# Patient Record
Sex: Female | Born: 1965
Health system: Southern US, Community
[De-identification: ages and names within clinical notes are randomized; demographics above are authoritative.]

## PROBLEM LIST (undated history)

## (undated) DIAGNOSIS — L309 Dermatitis, unspecified: Secondary | ICD-10-CM

## (undated) DIAGNOSIS — G43909 Migraine, unspecified, not intractable, without status migrainosus: Secondary | ICD-10-CM

## (undated) DIAGNOSIS — E559 Vitamin D deficiency, unspecified: Secondary | ICD-10-CM

## (undated) DIAGNOSIS — R202 Paresthesia of skin: Secondary | ICD-10-CM

## (undated) DIAGNOSIS — E785 Hyperlipidemia, unspecified: Secondary | ICD-10-CM

## (undated) DIAGNOSIS — K649 Unspecified hemorrhoids: Secondary | ICD-10-CM

## (undated) DIAGNOSIS — K219 Gastro-esophageal reflux disease without esophagitis: Secondary | ICD-10-CM

## (undated) DIAGNOSIS — R481 Agnosia: Secondary | ICD-10-CM

## (undated) DIAGNOSIS — M5030 Other cervical disc degeneration, unspecified cervical region: Secondary | ICD-10-CM

## (undated) DIAGNOSIS — R112 Nausea with vomiting, unspecified: Secondary | ICD-10-CM

## (undated) DIAGNOSIS — M21379 Foot drop, unspecified foot: Secondary | ICD-10-CM

## (undated) DIAGNOSIS — D649 Anemia, unspecified: Secondary | ICD-10-CM

## (undated) DIAGNOSIS — K5790 Diverticulosis of intestine, part unspecified, without perforation or abscess without bleeding: Secondary | ICD-10-CM

## (undated) DIAGNOSIS — M1990 Unspecified osteoarthritis, unspecified site: Secondary | ICD-10-CM

## (undated) DIAGNOSIS — F418 Other specified anxiety disorders: Secondary | ICD-10-CM

## (undated) DIAGNOSIS — D1803 Hemangioma of intra-abdominal structures: Secondary | ICD-10-CM

## (undated) DIAGNOSIS — G8929 Other chronic pain: Secondary | ICD-10-CM

## (undated) DIAGNOSIS — K8020 Calculus of gallbladder without cholecystitis without obstruction: Secondary | ICD-10-CM

## (undated) DIAGNOSIS — R413 Other amnesia: Secondary | ICD-10-CM

## (undated) DIAGNOSIS — E611 Iron deficiency: Secondary | ICD-10-CM

## (undated) DIAGNOSIS — R350 Frequency of micturition: Secondary | ICD-10-CM

## (undated) DIAGNOSIS — Z8489 Family history of other specified conditions: Secondary | ICD-10-CM

## (undated) DIAGNOSIS — F32A Depression, unspecified: Secondary | ICD-10-CM

## (undated) DIAGNOSIS — K589 Irritable bowel syndrome without diarrhea: Secondary | ICD-10-CM

## (undated) DIAGNOSIS — F329 Major depressive disorder, single episode, unspecified: Secondary | ICD-10-CM

## (undated) DIAGNOSIS — R51 Headache: Secondary | ICD-10-CM

## (undated) DIAGNOSIS — R32 Unspecified urinary incontinence: Secondary | ICD-10-CM

## (undated) DIAGNOSIS — E039 Hypothyroidism, unspecified: Secondary | ICD-10-CM

## (undated) DIAGNOSIS — Z87442 Personal history of urinary calculi: Secondary | ICD-10-CM

## (undated) DIAGNOSIS — M51369 Other intervertebral disc degeneration, lumbar region without mention of lumbar back pain or lower extremity pain: Secondary | ICD-10-CM

## (undated) DIAGNOSIS — R911 Solitary pulmonary nodule: Secondary | ICD-10-CM

## (undated) DIAGNOSIS — M5136 Other intervertebral disc degeneration, lumbar region: Secondary | ICD-10-CM

## (undated) DIAGNOSIS — F419 Anxiety disorder, unspecified: Secondary | ICD-10-CM

## (undated) DIAGNOSIS — R937 Abnormal findings on diagnostic imaging of other parts of musculoskeletal system: Secondary | ICD-10-CM

## (undated) DIAGNOSIS — Z9889 Other specified postprocedural states: Secondary | ICD-10-CM

## (undated) DIAGNOSIS — N319 Neuromuscular dysfunction of bladder, unspecified: Secondary | ICD-10-CM

## (undated) DIAGNOSIS — N3946 Mixed incontinence: Secondary | ICD-10-CM

## (undated) DIAGNOSIS — R351 Nocturia: Secondary | ICD-10-CM

## (undated) DIAGNOSIS — G4730 Sleep apnea, unspecified: Secondary | ICD-10-CM

## (undated) DIAGNOSIS — G5790 Unspecified mononeuropathy of unspecified lower limb: Secondary | ICD-10-CM

## (undated) DIAGNOSIS — G039 Meningitis, unspecified: Secondary | ICD-10-CM

## (undated) DIAGNOSIS — M797 Fibromyalgia: Secondary | ICD-10-CM

## (undated) HISTORY — DX: Mixed incontinence: N39.46

## (undated) HISTORY — DX: Vitamin D deficiency, unspecified: E55.9

## (undated) HISTORY — PX: SPINE SURGERY: SHX786

## (undated) HISTORY — PX: HEMORRHOID BANDING: SHX5850

## (undated) HISTORY — DX: Unspecified osteoarthritis, unspecified site: M19.90

## (undated) HISTORY — DX: Meningitis, unspecified: G03.9

## (undated) HISTORY — DX: Anxiety disorder, unspecified: F41.9

## (undated) HISTORY — DX: Unspecified hemorrhoids: K64.9

## (undated) HISTORY — DX: Irritable bowel syndrome, unspecified: K58.9

## (undated) HISTORY — DX: Hyperlipidemia, unspecified: E78.5

## (undated) HISTORY — DX: Depression, unspecified: F32.A

## (undated) HISTORY — PX: LASIK: SHX215

## (undated) HISTORY — DX: Calculus of gallbladder without cholecystitis without obstruction: K80.20

## (undated) HISTORY — DX: Migraine, unspecified, not intractable, without status migrainosus: G43.909

## (undated) HISTORY — DX: Neuromuscular dysfunction of bladder, unspecified: N31.9

## (undated) HISTORY — DX: Iron deficiency: E61.1

## (undated) HISTORY — DX: Diverticulosis of intestine, part unspecified, without perforation or abscess without bleeding: K57.90

## (undated) HISTORY — DX: Major depressive disorder, single episode, unspecified: F32.9

## (undated) HISTORY — DX: Other specified anxiety disorders: F41.8

## (undated) HISTORY — DX: Paresthesia of skin: R20.2

## (undated) HISTORY — DX: Other cervical disc degeneration, unspecified cervical region: M50.30

## (undated) HISTORY — DX: Dermatitis, unspecified: L30.9

## (undated) HISTORY — PX: ANTERIOR CERVICAL DECOMP/DISCECTOMY FUSION: SHX1161

## (undated) HISTORY — DX: Solitary pulmonary nodule: R91.1

## (undated) HISTORY — PX: BACK SURGERY: SHX140

## (undated) HISTORY — DX: Abnormal findings on diagnostic imaging of other parts of musculoskeletal system: R93.7

---
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
## 1999-01-02 HISTORY — PX: CHOLECYSTECTOMY: SHX55

## 2003-10-11 ENCOUNTER — Ambulatory Visit: Payer: Self-pay

## 2004-02-16 ENCOUNTER — Ambulatory Visit: Payer: Self-pay | Admitting: Internal Medicine

## 2004-03-02 ENCOUNTER — Ambulatory Visit: Payer: Self-pay

## 2006-01-01 DIAGNOSIS — N319 Neuromuscular dysfunction of bladder, unspecified: Secondary | ICD-10-CM

## 2006-01-01 DIAGNOSIS — N3946 Mixed incontinence: Secondary | ICD-10-CM

## 2006-01-01 HISTORY — PX: PUBOVAGINAL SLING: SHX1035

## 2006-01-01 HISTORY — DX: Neuromuscular dysfunction of bladder, unspecified: N31.9

## 2006-01-01 HISTORY — DX: Mixed incontinence: N39.46

## 2006-01-23 ENCOUNTER — Ambulatory Visit: Payer: Self-pay | Admitting: Family Medicine

## 2006-06-07 ENCOUNTER — Ambulatory Visit: Payer: Self-pay | Admitting: *Deleted

## 2006-08-02 LAB — HM COLONOSCOPY: HM Colonoscopy: NORMAL

## 2006-08-22 ENCOUNTER — Ambulatory Visit: Payer: Self-pay | Admitting: Gastroenterology

## 2006-10-16 ENCOUNTER — Encounter: Payer: Self-pay | Admitting: Rheumatology

## 2006-11-02 ENCOUNTER — Encounter: Payer: Self-pay | Admitting: Rheumatology

## 2006-12-02 ENCOUNTER — Encounter: Payer: Self-pay | Admitting: Rheumatology

## 2006-12-10 ENCOUNTER — Ambulatory Visit (HOSPITAL_BASED_OUTPATIENT_CLINIC_OR_DEPARTMENT_OTHER): Admission: RE | Admit: 2006-12-10 | Discharge: 2006-12-10 | Payer: Self-pay | Admitting: Urology

## 2007-01-14 ENCOUNTER — Ambulatory Visit: Payer: Self-pay | Admitting: Internal Medicine

## 2007-01-15 ENCOUNTER — Ambulatory Visit: Payer: Self-pay | Admitting: Internal Medicine

## 2007-01-24 ENCOUNTER — Ambulatory Visit: Payer: Self-pay | Admitting: Internal Medicine

## 2008-01-13 ENCOUNTER — Ambulatory Visit: Payer: Self-pay | Admitting: Internal Medicine

## 2008-05-26 ENCOUNTER — Ambulatory Visit: Payer: Self-pay | Admitting: Unknown Physician Specialty

## 2008-05-28 ENCOUNTER — Ambulatory Visit: Payer: Self-pay

## 2008-12-03 ENCOUNTER — Ambulatory Visit: Payer: Self-pay

## 2009-01-01 DIAGNOSIS — G5790 Unspecified mononeuropathy of unspecified lower limb: Secondary | ICD-10-CM

## 2009-01-01 HISTORY — DX: Unspecified mononeuropathy of unspecified lower limb: G57.90

## 2009-05-06 ENCOUNTER — Encounter: Admission: RE | Admit: 2009-05-06 | Discharge: 2009-05-06 | Payer: Self-pay | Admitting: Neurosurgery

## 2009-05-23 ENCOUNTER — Encounter (INDEPENDENT_AMBULATORY_CARE_PROVIDER_SITE_OTHER): Payer: Self-pay | Admitting: Neurosurgery

## 2009-05-23 ENCOUNTER — Inpatient Hospital Stay (HOSPITAL_COMMUNITY): Admission: RE | Admit: 2009-05-23 | Discharge: 2009-05-29 | Payer: Self-pay | Admitting: Neurosurgery

## 2009-06-03 ENCOUNTER — Emergency Department (HOSPITAL_COMMUNITY): Admission: EM | Admit: 2009-06-03 | Discharge: 2009-06-03 | Payer: Self-pay | Admitting: Emergency Medicine

## 2009-06-07 ENCOUNTER — Encounter: Admission: RE | Admit: 2009-06-07 | Discharge: 2009-06-07 | Payer: Self-pay | Admitting: Neurosurgery

## 2009-06-08 ENCOUNTER — Inpatient Hospital Stay (HOSPITAL_COMMUNITY): Admission: AD | Admit: 2009-06-08 | Discharge: 2009-06-16 | Payer: Self-pay | Admitting: Neurosurgery

## 2009-08-18 ENCOUNTER — Encounter: Admission: RE | Admit: 2009-08-18 | Discharge: 2009-08-18 | Payer: Self-pay | Admitting: Neurosurgery

## 2009-10-21 ENCOUNTER — Encounter: Admission: RE | Admit: 2009-10-21 | Discharge: 2009-10-21 | Payer: Self-pay | Admitting: Neurosurgery

## 2009-11-23 ENCOUNTER — Encounter: Admission: RE | Admit: 2009-11-23 | Discharge: 2009-11-23 | Payer: Self-pay | Admitting: Neurosurgery

## 2009-11-29 ENCOUNTER — Ambulatory Visit: Payer: Self-pay | Admitting: Vascular Surgery

## 2009-12-09 ENCOUNTER — Ambulatory Visit: Payer: Self-pay

## 2009-12-19 ENCOUNTER — Inpatient Hospital Stay (HOSPITAL_COMMUNITY)
Admission: RE | Admit: 2009-12-19 | Discharge: 2009-12-23 | Payer: Self-pay | Source: Home / Self Care | Attending: Neurosurgery | Admitting: Neurosurgery

## 2010-01-01 DIAGNOSIS — R937 Abnormal findings on diagnostic imaging of other parts of musculoskeletal system: Secondary | ICD-10-CM

## 2010-01-01 HISTORY — DX: Abnormal findings on diagnostic imaging of other parts of musculoskeletal system: R93.7

## 2010-01-26 ENCOUNTER — Encounter
Admission: RE | Admit: 2010-01-26 | Discharge: 2010-01-26 | Payer: Self-pay | Source: Home / Self Care | Attending: Neurosurgery | Admitting: Neurosurgery

## 2010-01-28 ENCOUNTER — Encounter
Admission: RE | Admit: 2010-01-28 | Discharge: 2010-01-28 | Payer: Self-pay | Source: Home / Self Care | Attending: Neurosurgery | Admitting: Neurosurgery

## 2010-02-02 ENCOUNTER — Other Ambulatory Visit: Payer: Self-pay | Admitting: Neurosurgery

## 2010-02-02 DIAGNOSIS — M545 Low back pain, unspecified: Secondary | ICD-10-CM

## 2010-02-09 NOTE — Discharge Summary (Signed)
  NAMEBEVAN, Karen Aguilar                ACCOUNT NO.:  [id number]  MEDICAL RECORD NO.:  [id number]          PATIENT TYPE:  INP  LOCATION:  3021                         FACILITY:  [facility]  PHYSICIAN:  Donalee Citrin, M.D.        DATE OF BIRTH:  02-23-1965  DATE OF ADMISSION:  12/19/2009 DATE OF DISCHARGE:  12/23/2009                              DISCHARGE SUMMARY   ADMITTING DIAGNOSIS:  Pseudoarthrosis, L5-S1.  DISCHARGE DIAGNOSIS:  Pseudoarthrosis, L5-S1.  PROCEDURE:  During this hospitalization was anterior lumbar interbody fusion, L5-S1 with exploration of fusion, removal of hardware, L4-S1 and replacement of right S1 screw.  HOSPITAL COURSE:  The patient is a 45 year old female who was admitted to the hospital with the aforementioned procedure.  Postoperatively, the patient did very well, went to the recovery room, and then to the floor. On the floor, the patient convalesced well with minimal to no discomfort.  Strength was at her baseline which had some subtle dorsiflexion of her left foot, but this was where her baseline neurologic exam.  She continued to hydrate and her sats were good.  She was slowly over the next couple days mobilized with physical and occupational therapy.  The pain medications were manipulated.  At that time, she did not get a better control, however, she responded well to going up on her fentanyl patch.  Physical therapy continued to work with her and she continued to slowly mobilize.  She did have a couple of episodes of hypotension, this responded well with fluids.  Her electrolytes were checked as well as her CBC and blood counts which were all within normal range except for some mild anemia.  As she continued to go through physical therapy, she continued to improve and by hospital day #5, the patient was doing well and requested to go home.  She was voiding spontaneously.  Her wound was clean and dry.  Her neurologic exam was back to baseline.  Her pain was  well-controlled on pills.  So, at the time of discharge, the patient was stable to follow in approximately in 1 week with both Neurosurgery and Vascular Surgery.          ______________________________ Donalee Citrin, M.D.     GC/MEDQ  D:  01/26/2010  T:  01/26/2010  Job:  [id number]  Electronically Signed by Donalee Citrin M.D. on 02/09/2010 08:51:23 AM

## 2010-03-09 NOTE — Op Note (Addendum)
Karen Aguilar, Karen Aguilar                ACCOUNT NO.:  [id number]  MEDICAL RECORD NO.:  [id number]          PATIENT TYPE:  INP  LOCATION:  3021                         FACILITY:  [facility]  PHYSICIAN:  Larina Earthly, M.D.    DATE OF BIRTH:  12-22-1965  DATE OF PROCEDURE:  12/20/2010 DATE OF DISCHARGE:  12/23/2009                              OPERATIVE REPORT   PREOPERATIVE DIAGNOSIS:  Lumbar disk disease.  POSTOPERATIVE DIAGNOSIS:  Lumbar disk disease.  PROCEDURE:  Anterior exposure for an anterior lumbar interbody fusion, which will be dictated as separate note by Dr. Donalee Citrin.  SURGEONS FOR EXPOSURE:  Larina Earthly, MD, and Donalee Citrin, MD  ANESTHESIA:  General endotracheal.  COMPLICATIONS:  None.  PROCEDURE IN DETAIL:  The patient initially had unlocking posteriorly and was repositioned for anterior surgery.  The C-arm was brought onto the field for a lateral projection and showed level of the L5-S1 disk. Incision was made to the left of the midline transversely and carried down through the subcutaneous fat to the level of the anterior rectus sheath.  The anterior rectus sheath was opened in line with the skin incision.  The rectus muscle was circumferentially mobilized, and mobilization was carried proximally and distally.  The retroperitoneum was entered bluntly lateral to the rectus muscle, and blunt dissection was used to mobilize the peritoneal contents to the right.  The ureter was identified and preserved.  Dissection plane was anterior to the psoas muscle.  Blunt dissection was continued to the level of the L5-S1 disk.  The middle sacral vessels were divided with Ligaclips for hemostasis.  KUBs were used for further mobilization to the right and left of the disk.  This gave full mobilization of the iliac veins for exposure to the L5-S1 disk.  The brow retractor was brought onto the field, and one reverse lip blade was placed to the right and left of the disk, and the  malleable retractors were placed for anterior and posterior mobilization.  The exposure was adequate, and the remainder of the procedure will be dictated as a separate note by Dr. Vladimir Crofts, M.D.     TFE/MEDQ  D:  03/07/2010  T:  03/08/2010  Job:  [id number]  Electronically Signed by TODD EARLY M.D. on 03/08/2010 10:32:21 AM Electronically Signed by Tawanna Cooler EARLY M.D. on 03/08/2010 10:33:09 AM Electronically Signed by Tawanna Cooler EARLY M.D. on 03/08/2010 10:34:10 AM Electronically Signed by Tawanna Cooler EARLY M.D. on 03/08/2010 10:35:12 AM Electronically Signed by Tawanna Cooler EARLY M.D. on 03/08/2010 10:36:02 AM Electronically Signed by Tawanna Cooler EARLY M.D. on 03/08/2010 10:37:03 AM Electronically Signed by Tawanna Cooler EARLY M.D. on 03/08/2010 10:38:03 AM Electronically Signed by Tawanna Cooler EARLY M.D. on 03/08/2010 10:39:03 AM Electronically Signed by Tawanna Cooler EARLY M.D. on 03/08/2010 10:40:03 AM Electronically Signed by Tawanna Cooler EARLY M.D. on 03/08/2010 10:41:02 AM Electronically Signed by Tawanna Cooler EARLY M.D. on 03/08/2010 10:42:02 AM Electronically Signed by Tawanna Cooler EARLY M.D. on 03/08/2010 10:43:03 AM Electronically Signed by Tawanna Cooler EARLY M.D. on 03/08/2010 10:44:04 AM Electronically Signed by Tawanna Cooler EARLY M.D. on 03/08/2010 10:45:04 AM Electronically Signed by TODD EARLY M.D.  on 03/08/2010 10:46:03 AM Electronically Signed by Tawanna Cooler EARLY M.D. on 03/08/2010 10:47:10 AM Electronically Signed by Tawanna Cooler EARLY M.D. on 03/08/2010 10:48:05 AM Electronically Signed by TODD EARLY M.D. on 03/08/2010 10:49:05 AM Electronically Signed by TODD EARLY M.D. on 03/08/2010 10:50:06 AM Electronically Signed by Tawanna Cooler EARLY M.D. on 03/08/2010 10:51:05 AM Electronically Signed by Tawanna Cooler EARLY M.D. on 03/08/2010 10:52:05 AM Electronically Signed by Tawanna Cooler EARLY M.D. on 03/08/2010 10:53:07 AM Electronically Signed by Tawanna Cooler EARLY M.D. on 03/08/2010 10:54:05 AM Electronically Signed by Tawanna Cooler EARLY M.D. on 03/08/2010 10:55:05 AM Electronically Signed by  Tawanna Cooler EARLY M.D. on 03/08/2010 10:56:06 AM Electronically Signed by Tawanna Cooler EARLY M.D. on 03/08/2010 10:57:08 AM Electronically Signed by Tawanna Cooler EARLY M.D. on 03/08/2010 10:58:05 AM Electronically Signed by Tawanna Cooler EARLY M.D. on 03/08/2010 10:59:06 AM Electronically Signed by Tawanna Cooler EARLY M.D. on 03/08/2010 11:00:09 AM Electronically Signed by Tawanna Cooler EARLY M.D. on 03/08/2010 11:01:18 AM Electronically Signed by Tawanna Cooler EARLY M.D. on 03/08/2010 11:02:06 AM Electronically Signed by Tawanna Cooler EARLY M.D. on 03/08/2010 11:03:05 AM Electronically Signed by Tawanna Cooler EARLY M.D. on 03/08/2010 11:04:05 AM Electronically Signed by Tawanna Cooler EARLY M.D. on 03/08/2010 11:05:07 AM Electronically Signed by Tawanna Cooler EARLY M.D. on 03/08/2010 11:06:06 AM Electronically Signed by Tawanna Cooler EARLY M.D. on 03/08/2010 11:07:05 AM Electronically Signed by TODD EARLY M.D. on 03/08/2010 11:08:05 AM

## 2010-03-13 LAB — CBC
HCT: 27.7 % — ABNORMAL LOW (ref 36.0–46.0)
HCT: 38 % (ref 36.0–46.0)
Hemoglobin: 8.7 g/dL — ABNORMAL LOW (ref 12.0–15.0)
MCH: 24.5 pg — ABNORMAL LOW (ref 26.0–34.0)
MCHC: 31.4 g/dL (ref 30.0–36.0)
MCV: 77.2 fL — ABNORMAL LOW (ref 78.0–100.0)
MCV: 78 fL (ref 78.0–100.0)
Platelets: 186 K/uL (ref 150–400)
RBC: 3.55 MIL/uL — ABNORMAL LOW (ref 3.87–5.11)
RDW: 15.6 % — ABNORMAL HIGH (ref 11.5–15.5)
RDW: 15.7 % — ABNORMAL HIGH (ref 11.5–15.5)
WBC: 4.6 10*3/uL (ref 4.0–10.5)
WBC: 7.4 K/uL (ref 4.0–10.5)

## 2010-03-13 LAB — DIFFERENTIAL
Basophils Absolute: 0 10*3/uL (ref 0.0–0.1)
Basophils Absolute: 0 K/uL (ref 0.0–0.1)
Basophils Relative: 0 % (ref 0–1)
Eosinophils Absolute: 0 K/uL (ref 0.0–0.7)
Eosinophils Relative: 0 % (ref 0–5)
Eosinophils Relative: 0 % (ref 0–5)
Lymphocytes Relative: 17 % (ref 12–46)
Lymphocytes Relative: 25 % (ref 12–46)
Lymphs Abs: 1.3 K/uL (ref 0.7–4.0)
Monocytes Absolute: 0.3 10*3/uL (ref 0.1–1.0)
Monocytes Absolute: 0.7 K/uL (ref 0.1–1.0)
Monocytes Relative: 10 % (ref 3–12)
Neutro Abs: 5.4 K/uL (ref 1.7–7.7)
Neutrophils Relative %: 73 % (ref 43–77)

## 2010-03-13 LAB — SURGICAL PCR SCREEN
MRSA, PCR: NEGATIVE
Staphylococcus aureus: NEGATIVE

## 2010-03-13 LAB — TYPE AND SCREEN: Antibody Screen: NEGATIVE

## 2010-03-13 LAB — BASIC METABOLIC PANEL
BUN: 10 mg/dL (ref 6–23)
Chloride: 109 mEq/L (ref 96–112)
GFR calc Af Amer: >60 mL/min (ref >60)
GFR calc non Af Amer: >60 mL/min (ref >60)
Glucose, Bld: 119 mg/dL — ABNORMAL HIGH (ref 70–99)
Glucose, Bld: 129 mg/dL — ABNORMAL HIGH (ref 70–99)
Potassium: 3.4 mEq/L — ABNORMAL LOW (ref 3.5–5.1)
Potassium: 4.2 mEq/L (ref 3.5–5.1)
Sodium: 136 mEq/L (ref 135–145)

## 2010-03-20 LAB — POCT I-STAT 4, (NA,K, GLUC, HGB,HCT)
HCT: 33 % — ABNORMAL LOW (ref 36.0–46.0)
Hemoglobin: 11.2 g/dL — ABNORMAL LOW (ref 12.0–15.0)
Potassium: 4 mEq/L (ref 3.5–5.1)

## 2010-03-20 LAB — HCG, SERUM, QUALITATIVE: Preg, Serum: NEGATIVE

## 2010-03-20 LAB — CROSSMATCH: ABO/RH(D): A POS

## 2010-03-20 LAB — CBC
Hemoglobin: 12.3 g/dL (ref 12.0–15.0)
MCHC: 33.8 g/dL (ref 30.0–36.0)
MCV: 79.7 fL (ref 78.0–100.0)
RDW: 16 % — ABNORMAL HIGH (ref 11.5–15.5)

## 2010-03-20 LAB — SURGICAL PCR SCREEN: MRSA, PCR: NEGATIVE

## 2010-03-30 ENCOUNTER — Ambulatory Visit
Admission: RE | Admit: 2010-03-30 | Discharge: 2010-03-30 | Disposition: A | Discharge status: Home / Self Care | Payer: BC Managed Care – PPO | Source: Ambulatory Visit | Attending: Neurosurgery | Admitting: Neurosurgery

## 2010-03-30 DIAGNOSIS — M545 Low back pain, unspecified: Secondary | ICD-10-CM

## 2010-05-16 NOTE — Consult Note (Signed)
NEW PATIENT CONSULTATION   Grochowski, Breigh C  DOB:  [birth year]                                       11/29/2009  EAVWU#:[id number]   Shaye presents today for discussion regarding potential anterior  exposure for interbody fusion by Dr. Donalee Citrin with myself  exposing.  She is a 45 year old white female with a long history of lumbar disk  disease.  She reports she has had 5 different fusions or procedures,  with still having discomfort.  She also had cervical spine fusion, C3,  C4 and C5. She has had prior surgical history of TMJ reconstruction and  also had 2 C-sections and had a cholecystectomy laparoscopically and  bladder sling.  She does not have any cardiac or other major medical  difficulties.   SOCIAL HISTORY:  She is married with 2 children.  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  She does not smoke or drink alcohol.   FAMILY HISTORY:  Negative for premature atherosclerotic disease.   REVIEW OF SYSTEMS:  No weight loss or gain.  She denies any vascular or  cardiac disease.  GI:  Positive for reflux.  NEUROLOGIC, PULMONARY AND HEMATOLOGIC:  Negative.  GENITOURINARY:  Positive for urinary frequency.  ENT:  Negative.  MUSCULOSKELETAL:  Positive for arthritis, joint pain, and muscle pain.  PSYCHIATRIC:  Positive for depression.  SKIN:  Negative.   PHYSICAL EXAMINATION:  General:  Well developed, well nourished white  female appearing stated age in no acute distress.  Vital signs:  Blood  pressure 94/65, pulse 92, respirations 18.  HEENT:  Normal.  Chest:  Clear bilaterally.  Heart:  Regular rate and rhythm.  She has 2+ radial  and 2+ posterior tibial pulses bilaterally.  Abdomen:  Soft, nontender.  No masses are noted.  She does have a lower Pfannenstiel incision and a  periumbilical incision.  No incisions in her left lower quadrant or  right lower quadrant.  Musculoskeletal:  Shows no major deformity or  cyanosis.  Neurologic:  No focal weakness,  paresthesias.   Skin:  Without ulcers or rashes.   I had a long discussion with this patient and her husband present  explaining my role for exposure.  I explained the mobilization of the  intraperitoneal contents, ureter, iliac vessels, and potential injury  for all these above.  I explained with a prior abdominal surgery that  she may have adhesions but this would in all likelihood would not be the  case with the level of her incisions and typically may have very well  impact on anterior  exposure.  I feel that from an anatomic standpoint  it is safe to proceed as Dr. Wynetta Emery has recommended for surgery.  This has  been scheduled for December 19, 2009 at  [facility].     Larina Earthly, M.D.  Electronically Signed   TFE/MEDQ  D:  11/29/2009  T:  11/30/2009  Job:  [id number]   cc:   Donalee Citrin, M.D.

## 2010-05-16 NOTE — Op Note (Signed)
NAMEFRANCENA, Aguilar                ACCOUNT NO.:  [id number]   MEDICAL RECORD NO.:  [id number]          PATIENT TYPE:  AMB   LOCATION:  NESC                         FACILITY:  [facility]   PHYSICIAN:  Martina Sinner, MD DATE OF BIRTH:  27-Dec-1965   DATE OF PROCEDURE:  12/10/2006  DATE OF DISCHARGE:                               OPERATIVE REPORT   PREOPERATIVE DIAGNOSIS:  Stress incontinence and pelvic pain.   POSTOPERATIVE DIAGNOSIS:  Stress incontinence and pelvic pain.   PROCEDURE:  Sling, cystourethropexy [facility]) plus cystoscopy plus  hydrodistention.   [name] has some ill-defined pelvic pain.  She has mild mixed  stress and urge incontinence and does not tolerate anticholinergics  well.  She consented to the above procedure.   The patient was prepped and draped in the usual fashion.  She was given  preoperative antibiotics.  Extra care was taken to minimize the risk of  compartment syndrome, DVT and neuropathy.   Preoperative lab tests were normal.   I initially made two 1 cm incisions one fingerbreadth above the  symphysis pubis 1.5 cm lateral to the midline.  I then cystoscoped the  patient.  Bladder mucosa and trigone were normal.  There was no  __________  foreign body or carcinoma.  She was hydrodistended 850 mL.  On reinspection of the bladder she had a few glomerulations at 5 and 7  o'clock and in my opinion this is artifact and not supportive of a  diagnosis of interstitial cystitis.  The bladder was emptied.   I made a 2 cm incision overlying the mid urethra after instilling  Lidocaine episode mixture approximately 7 mL.  I made a deep incision so  the pubocervical fascia would be thick.  I sharply dissected to the  urethrovesical angle bilaterally.  With the bladder empty I passed a  SPARC needle on top of and along the back of the symphysis pubis  parallel to the midline and delivered it on the pulp of my index finger  bilaterally.  I cystoscoped the  patient and there was no deflection of  the bladder when I wiggled the trocar.  There was no injury to the  bladder or urethra and there was efflux of Indigo Carmine from both  ureteral orifices.   With the bladder emptied I attached SPARC sling and brought it to the  retropubic space.  I cut below the blue dot, irrigated the sheath and  removed the sheath.  I tensioned it over the fat part of the mid sized  Kelly clamp.  There was hypermobility in the midline.  There was not a  spring back effect.  I was happy with the tension and the mid urethral  position of the sling.   I irrigated all incisions.  I closed the vaginal wall with 2-0 Vicryl  followed by two interrupted sutures.  I closed the skin after cutting  the sling below the level of the skin with 4-0 subcuticular and  Dermabond.  The catheter was  draining well of blue urine at the end of the case.  Hopefully this  operation will reach her treatment goal.  I do not think she has  interstitial cystitis.  To pursue the diagnosis further one could  consider bladder instillation therapies to see if they relieve her of  her pain.           ______________________________  Martina Sinner, MD  Electronically Signed     SAM/MEDQ  D:  12/10/2006  T:  12/10/2006  Job:  [id number]

## 2010-06-27 ENCOUNTER — Other Ambulatory Visit: Payer: Self-pay | Admitting: Neurosurgery

## 2010-06-27 DIAGNOSIS — M545 Low back pain, unspecified: Secondary | ICD-10-CM

## 2010-08-24 ENCOUNTER — Ambulatory Visit
Admission: RE | Admit: 2010-08-24 | Discharge: 2010-08-24 | Disposition: A | Discharge status: Home / Self Care | Payer: BC Managed Care – PPO | Source: Ambulatory Visit | Attending: Neurosurgery | Admitting: Neurosurgery

## 2010-08-24 ENCOUNTER — Other Ambulatory Visit: Payer: Self-pay | Admitting: Neurosurgery

## 2010-08-24 DIAGNOSIS — M549 Dorsalgia, unspecified: Secondary | ICD-10-CM

## 2010-08-29 ENCOUNTER — Other Ambulatory Visit: Payer: BC Managed Care – PPO

## 2010-08-29 ENCOUNTER — Other Ambulatory Visit (HOSPITAL_COMMUNITY): Payer: Self-pay | Admitting: Neurosurgery

## 2010-08-29 ENCOUNTER — Ambulatory Visit
Admission: RE | Admit: 2010-08-29 | Discharge: 2010-08-29 | Disposition: A | Discharge status: Home / Self Care | Payer: BC Managed Care – PPO | Source: Ambulatory Visit | Attending: Neurosurgery | Admitting: Neurosurgery

## 2010-08-29 DIAGNOSIS — M545 Low back pain, unspecified: Secondary | ICD-10-CM

## 2010-09-06 ENCOUNTER — Encounter (HOSPITAL_COMMUNITY): Payer: BC Managed Care – PPO

## 2010-09-06 ENCOUNTER — Ambulatory Visit (HOSPITAL_COMMUNITY)
Admission: RE | Admit: 2010-09-06 | Discharge: 2010-09-06 | Disposition: A | Discharge status: Home / Self Care | Payer: BC Managed Care – PPO | Source: Ambulatory Visit | Attending: Neurosurgery | Admitting: Neurosurgery

## 2010-09-06 ENCOUNTER — Other Ambulatory Visit (HOSPITAL_COMMUNITY): Payer: BC Managed Care – PPO

## 2010-09-06 DIAGNOSIS — M545 Low back pain, unspecified: Secondary | ICD-10-CM

## 2010-09-06 DIAGNOSIS — Z9889 Other specified postprocedural states: Secondary | ICD-10-CM | POA: Insufficient documentation

## 2010-09-06 DIAGNOSIS — M79609 Pain in unspecified limb: Secondary | ICD-10-CM | POA: Insufficient documentation

## 2010-09-06 MED ORDER — TECHNETIUM TC 99M MEDRONATE IV KIT
25.0000 | PACK | Freq: Once | INTRAVENOUS | Status: AC | PRN
  Administered 2010-09-06: 25 via INTRAVENOUS

## 2010-09-27 ENCOUNTER — Other Ambulatory Visit: Payer: Self-pay | Admitting: Neurosurgery

## 2010-09-27 DIAGNOSIS — M542 Cervicalgia: Secondary | ICD-10-CM

## 2010-09-30 ENCOUNTER — Ambulatory Visit
Admission: RE | Admit: 2010-09-30 | Discharge: 2010-09-30 | Disposition: A | Discharge status: Home / Self Care | Payer: BC Managed Care – PPO | Source: Ambulatory Visit | Attending: Neurosurgery | Admitting: Neurosurgery

## 2010-09-30 DIAGNOSIS — M542 Cervicalgia: Secondary | ICD-10-CM

## 2010-10-02 ENCOUNTER — Ambulatory Visit: Payer: Self-pay | Admitting: Internal Medicine

## 2010-10-02 LAB — VITAMIN B12: Vitamin B12 Deficiency Panel: 347

## 2010-10-02 LAB — FOLATE: Folate: 10.6

## 2010-10-02 LAB — COOMBS TEST, INDIRECT, QUALITATIVE: DIRECT COOMBS: NEGATIVE

## 2010-10-02 LAB — ANA: Anti Nuclear Antibody(ANA): NEGATIVE

## 2010-10-02 LAB — LACTATE DEHYDROGENASE: LDH: 132 U/L

## 2010-10-05 ENCOUNTER — Other Ambulatory Visit: Payer: Self-pay | Admitting: Neurosurgery

## 2010-10-05 DIAGNOSIS — M545 Low back pain, unspecified: Secondary | ICD-10-CM

## 2010-10-05 DIAGNOSIS — M25512 Pain in left shoulder: Secondary | ICD-10-CM

## 2010-10-09 LAB — TYPE AND SCREEN: ABO/RH(D): A POS

## 2010-10-09 LAB — CBC
HCT: 46
Platelets: 338
RDW: 11.5
WBC: 7.2

## 2010-10-09 LAB — PROTIME-INR
INR: 0.9
Prothrombin Time: 12.5

## 2010-10-09 LAB — APTT: aPTT: 27

## 2010-10-09 LAB — ABO/RH: ABO/RH(D): A POS

## 2010-10-11 ENCOUNTER — Ambulatory Visit
Admission: RE | Admit: 2010-10-11 | Discharge: 2010-10-11 | Disposition: A | Discharge status: Home / Self Care | Payer: BC Managed Care – PPO | Source: Ambulatory Visit | Attending: Neurosurgery | Admitting: Neurosurgery

## 2010-10-11 DIAGNOSIS — M25512 Pain in left shoulder: Secondary | ICD-10-CM

## 2010-11-02 ENCOUNTER — Ambulatory Visit: Payer: Self-pay | Admitting: Internal Medicine

## 2010-11-02 ENCOUNTER — Ambulatory Visit
Admission: RE | Admit: 2010-11-02 | Discharge: 2010-11-02 | Disposition: A | Discharge status: Home / Self Care | Payer: BC Managed Care – PPO | Source: Ambulatory Visit | Attending: Neurosurgery | Admitting: Neurosurgery

## 2010-11-02 DIAGNOSIS — M545 Low back pain, unspecified: Secondary | ICD-10-CM

## 2010-11-14 LAB — FERRITIN: Ferritin: 213

## 2010-11-14 LAB — IRON AND TIBC
IRON SATURATION: 41
IRON: 105
TIBC: 255

## 2010-11-27 ENCOUNTER — Other Ambulatory Visit: Payer: BC Managed Care – PPO

## 2010-12-02 ENCOUNTER — Ambulatory Visit: Payer: Self-pay | Admitting: Internal Medicine

## 2010-12-14 ENCOUNTER — Encounter (HOSPITAL_BASED_OUTPATIENT_CLINIC_OR_DEPARTMENT_OTHER): Payer: Self-pay | Admitting: *Deleted

## 2010-12-14 ENCOUNTER — Other Ambulatory Visit: Payer: Self-pay | Admitting: Otolaryngology

## 2010-12-14 NOTE — Progress Notes (Signed)
To [facility] at 1100 ,urine pregnancy test,Hg on arrival.npo after mn,clear liquids only until 0500 then npo [facility] guidelines reviewed.instructed to take thyroid medications, oxymorphone,methadone ,zantac that am with water.

## 2010-12-18 ENCOUNTER — Ambulatory Visit (HOSPITAL_BASED_OUTPATIENT_CLINIC_OR_DEPARTMENT_OTHER): Payer: BC Managed Care – PPO | Admitting: Anesthesiology

## 2010-12-18 ENCOUNTER — Ambulatory Visit (HOSPITAL_BASED_OUTPATIENT_CLINIC_OR_DEPARTMENT_OTHER)
Admission: RE | Admit: 2010-12-18 | Discharge: 2010-12-19 | Disposition: A | Discharge status: Home / Self Care | Payer: BC Managed Care – PPO | Source: Ambulatory Visit | Attending: Specialist | Admitting: Specialist

## 2010-12-18 ENCOUNTER — Encounter (HOSPITAL_BASED_OUTPATIENT_CLINIC_OR_DEPARTMENT_OTHER): Payer: Self-pay | Admitting: Anesthesiology

## 2010-12-18 ENCOUNTER — Encounter (HOSPITAL_BASED_OUTPATIENT_CLINIC_OR_DEPARTMENT_OTHER)
Admission: RE | Disposition: A | Discharge status: Home / Self Care | Payer: Self-pay | Source: Ambulatory Visit | Attending: Specialist

## 2010-12-18 ENCOUNTER — Ambulatory Visit (HOSPITAL_COMMUNITY): Payer: BC Managed Care – PPO

## 2010-12-18 ENCOUNTER — Encounter (HOSPITAL_BASED_OUTPATIENT_CLINIC_OR_DEPARTMENT_OTHER): Payer: Self-pay | Admitting: *Deleted

## 2010-12-18 DIAGNOSIS — M25819 Other specified joint disorders, unspecified shoulder: Secondary | ICD-10-CM | POA: Insufficient documentation

## 2010-12-18 DIAGNOSIS — M719 Bursopathy, unspecified: Secondary | ICD-10-CM | POA: Insufficient documentation

## 2010-12-18 DIAGNOSIS — Z79899 Other long term (current) drug therapy: Secondary | ICD-10-CM | POA: Insufficient documentation

## 2010-12-18 DIAGNOSIS — M19019 Primary osteoarthritis, unspecified shoulder: Secondary | ICD-10-CM | POA: Insufficient documentation

## 2010-12-18 DIAGNOSIS — M67919 Unspecified disorder of synovium and tendon, unspecified shoulder: Secondary | ICD-10-CM | POA: Insufficient documentation

## 2010-12-18 DIAGNOSIS — Z981 Arthrodesis status: Secondary | ICD-10-CM | POA: Insufficient documentation

## 2010-12-18 DIAGNOSIS — M7540 Impingement syndrome of unspecified shoulder: Secondary | ICD-10-CM

## 2010-12-18 DIAGNOSIS — M24119 Other articular cartilage disorders, unspecified shoulder: Secondary | ICD-10-CM | POA: Insufficient documentation

## 2010-12-18 DIAGNOSIS — M7500 Adhesive capsulitis of unspecified shoulder: Secondary | ICD-10-CM | POA: Insufficient documentation

## 2010-12-18 HISTORY — DX: Headache: R51

## 2010-12-18 HISTORY — DX: Anemia, unspecified: D64.9

## 2010-12-18 HISTORY — DX: Fibromyalgia: M79.7

## 2010-12-18 HISTORY — DX: Other intervertebral disc degeneration, lumbar region: M51.36

## 2010-12-18 HISTORY — DX: Other intervertebral disc degeneration, lumbar region without mention of lumbar back pain or lower extremity pain: M51.369

## 2010-12-18 HISTORY — DX: Hypothyroidism, unspecified: E03.9

## 2010-12-18 HISTORY — DX: Nausea with vomiting, unspecified: R11.2

## 2010-12-18 HISTORY — DX: Unspecified urinary incontinence: R32

## 2010-12-18 HISTORY — DX: Unspecified mononeuropathy of unspecified lower limb: G57.90

## 2010-12-18 HISTORY — DX: Gastro-esophageal reflux disease without esophagitis: K21.9

## 2010-12-18 HISTORY — PX: SHOULDER CLOSED REDUCTION: SHX1051

## 2010-12-18 HISTORY — DX: Sleep apnea, unspecified: G47.30

## 2010-12-18 HISTORY — DX: Other specified postprocedural states: Z98.890

## 2010-12-18 SURGERY — SHOULDER ARTHROSCOPY WITH SUBACROMIAL DECOMPRESSION
Anesthesia: General | Site: Shoulder | Laterality: Left | Wound class: Clean

## 2010-12-18 MED ORDER — KETOROLAC TROMETHAMINE 30 MG/ML IJ SOLN
30.0000 mg | Freq: Four times a day (QID) | INTRAMUSCULAR | Status: DC
  Administered 2010-12-18 – 2010-12-19 (×3): 30 mg via INTRAVENOUS

## 2010-12-18 MED ORDER — CHLORHEXIDINE GLUCONATE 4 % EX LIQD: 60.0000 mL | Freq: Once | CUTANEOUS | Status: DC

## 2010-12-18 MED ORDER — ACETAMINOPHEN 10 MG/ML IV SOLN
1000.0000 mg | Freq: Four times a day (QID) | INTRAVENOUS | Status: DC
  Administered 2010-12-18 – 2010-12-19 (×3): 1000 mg via INTRAVENOUS

## 2010-12-18 MED ORDER — LACTATED RINGERS IV SOLN: INTRAVENOUS | Status: DC

## 2010-12-18 MED ORDER — METOCLOPRAMIDE HCL 5 MG PO TABS: 5.0000 mg | ORAL_TABLET | Freq: Three times a day (TID) | ORAL | Status: DC | PRN

## 2010-12-18 MED ORDER — DIPHENHYDRAMINE HCL 12.5 MG/5ML PO ELIX: 12.5000 mg | ORAL_SOLUTION | ORAL | Status: DC | PRN

## 2010-12-18 MED ORDER — LACTATED RINGERS IV SOLN
INTRAVENOUS | Status: DC
  Administered 2010-12-18: 12:00:00 via INTRAVENOUS

## 2010-12-18 MED ORDER — ESCITALOPRAM OXALATE 20 MG PO TABS: 20.0000 mg | ORAL_TABLET | Freq: Every day | ORAL | Status: DC

## 2010-12-18 MED ORDER — LEVOTHYROXINE SODIUM 50 MCG PO TABS: 50.0000 ug | ORAL_TABLET | Freq: Every day | ORAL | Status: DC

## 2010-12-18 MED ORDER — MENTHOL 3 MG MT LOZG: 1.0000 | LOZENGE | OROMUCOSAL | Status: DC | PRN

## 2010-12-18 MED ORDER — LIDOCAINE HCL (CARDIAC) 20 MG/ML IV SOLN
INTRAVENOUS | Status: DC | PRN
  Administered 2010-12-18: 80 mg via INTRAVENOUS

## 2010-12-18 MED ORDER — BUSPIRONE HCL 10 MG PO TABS
10.0000 mg | ORAL_TABLET | Freq: Three times a day (TID) | ORAL | Status: DC
  Administered 2010-12-18: 10 mg via ORAL

## 2010-12-18 MED ORDER — DOCUSATE SODIUM 100 MG PO CAPS
100.0000 mg | ORAL_CAPSULE | Freq: Two times a day (BID) | ORAL | Status: DC
  Administered 2010-12-18: 100 mg via ORAL

## 2010-12-18 MED ORDER — ONDANSETRON HCL 4 MG/2ML IJ SOLN: 4.0000 mg | Freq: Four times a day (QID) | INTRAMUSCULAR | Status: DC | PRN

## 2010-12-18 MED ORDER — SODIUM CHLORIDE 0.9 % IR SOLN
Status: DC | PRN
  Administered 2010-12-18: 3000 mL

## 2010-12-18 MED ORDER — DEXAMETHASONE SODIUM PHOSPHATE 4 MG/ML IJ SOLN
INTRAMUSCULAR | Status: DC | PRN
  Administered 2010-12-18: 10 mg via INTRAVENOUS

## 2010-12-18 MED ORDER — THYROID 60 MG PO TABS: 90.0000 mg | ORAL_TABLET | Freq: Every day | ORAL | Status: DC

## 2010-12-18 MED ORDER — METHOCARBAMOL 100 MG/ML IJ SOLN: 500.0000 mg | Freq: Four times a day (QID) | INTRAVENOUS | Status: DC | PRN

## 2010-12-18 MED ORDER — LACTATED RINGERS IV SOLN
INTRAVENOUS | Status: DC
  Administered 2010-12-18: 18:00:00 via INTRAVENOUS

## 2010-12-18 MED ORDER — BUPIVACAINE-EPINEPHRINE 0.5% -1:200000 IJ SOLN
INTRAMUSCULAR | Status: DC | PRN
  Administered 2010-12-18: 7 mL

## 2010-12-18 MED ORDER — FENTANYL CITRATE 0.05 MG/ML IJ SOLN
100.0000 ug | Freq: Once | INTRAMUSCULAR | Status: AC
  Administered 2010-12-18: 100 ug via INTRAVENOUS

## 2010-12-18 MED ORDER — ACETAMINOPHEN 325 MG PO TABS: 650.0000 mg | ORAL_TABLET | Freq: Four times a day (QID) | ORAL | Status: DC | PRN

## 2010-12-18 MED ORDER — ROCURONIUM BROMIDE 100 MG/10ML IV SOLN
INTRAVENOUS | Status: DC | PRN
  Administered 2010-12-18: 40 mg via INTRAVENOUS

## 2010-12-18 MED ORDER — FENTANYL CITRATE 0.05 MG/ML IJ SOLN
INTRAMUSCULAR | Status: DC | PRN
  Administered 2010-12-18: 50 ug via INTRAVENOUS
  Administered 2010-12-18: 100 ug via INTRAVENOUS
  Administered 2010-12-18 (×3): 50 ug via INTRAVENOUS

## 2010-12-18 MED ORDER — EPINEPHRINE HCL 1 MG/ML IJ SOLN
INTRAMUSCULAR | Status: DC | PRN
  Administered 2010-12-18: 2 mL

## 2010-12-18 MED ORDER — CEFAZOLIN SODIUM-DEXTROSE 2-3 GM-% IV SOLR
2.0000 g | INTRAVENOUS | Status: AC
  Administered 2010-12-18: 2 g via INTRAVENOUS

## 2010-12-18 MED ORDER — MIDAZOLAM HCL 5 MG/5ML IJ SOLN
INTRAMUSCULAR | Status: DC | PRN
  Administered 2010-12-18: 2 mg via INTRAVENOUS

## 2010-12-18 MED ORDER — ONDANSETRON HCL 4 MG/2ML IJ SOLN
INTRAMUSCULAR | Status: DC | PRN
  Administered 2010-12-18: 4 mg via INTRAVENOUS

## 2010-12-18 MED ORDER — FENTANYL CITRATE 0.05 MG/ML IJ SOLN
50.0000 ug | INTRAMUSCULAR | Status: DC | PRN
  Administered 2010-12-18 (×2): 50 ug via INTRAVENOUS

## 2010-12-18 MED ORDER — OXYMORPHONE HCL 5 MG PO TABS: 5.0000 mg | ORAL_TABLET | ORAL | Status: AC | PRN

## 2010-12-18 MED ORDER — METOCLOPRAMIDE HCL 5 MG/ML IJ SOLN: 5.0000 mg | Freq: Three times a day (TID) | INTRAMUSCULAR | Status: DC | PRN

## 2010-12-18 MED ORDER — METHADONE HCL 10 MG PO TABS
10.0000 mg | ORAL_TABLET | Freq: Three times a day (TID) | ORAL | Status: DC
  Administered 2010-12-18: 10 mg via ORAL

## 2010-12-18 MED ORDER — PROPOFOL 10 MG/ML IV EMUL
INTRAVENOUS | Status: DC | PRN
  Administered 2010-12-18: 180 mg via INTRAVENOUS

## 2010-12-18 MED ORDER — FAMOTIDINE 20 MG PO TABS: 20.0000 mg | ORAL_TABLET | Freq: Every day | ORAL | Status: DC

## 2010-12-18 MED ORDER — PHENOL 1.4 % MT LIQD: 1.0000 | OROMUCOSAL | Status: DC | PRN

## 2010-12-18 MED ORDER — METHOCARBAMOL 500 MG PO TABS: 500.0000 mg | ORAL_TABLET | Freq: Four times a day (QID) | ORAL | Status: DC | PRN

## 2010-12-18 MED ORDER — OXYMORPHONE HCL 5 MG PO TABS: 5.0000 mg | ORAL_TABLET | Freq: Four times a day (QID) | ORAL | Status: DC | PRN

## 2010-12-18 MED ORDER — TOPIRAMATE 100 MG PO TABS: 100.0000 mg | ORAL_TABLET | Freq: Every day | ORAL | Status: DC

## 2010-12-18 MED ORDER — ACETAMINOPHEN 650 MG RE SUPP: 650.0000 mg | Freq: Four times a day (QID) | RECTAL | Status: DC | PRN

## 2010-12-18 MED ORDER — CEFAZOLIN SODIUM-DEXTROSE 2-3 GM-% IV SOLR
2.0000 g | Freq: Four times a day (QID) | INTRAVENOUS | Status: AC
  Administered 2010-12-18 – 2010-12-19 (×2): 2 g via INTRAVENOUS

## 2010-12-18 MED ORDER — NITROFURANTOIN MACROCRYSTAL 100 MG PO CAPS
100.0000 mg | ORAL_CAPSULE | Freq: Four times a day (QID) | ORAL | Status: DC
  Administered 2010-12-18: 100 mg via ORAL

## 2010-12-18 MED ORDER — ONDANSETRON HCL 4 MG PO TABS: 4.0000 mg | ORAL_TABLET | Freq: Four times a day (QID) | ORAL | Status: DC | PRN

## 2010-12-18 SURGICAL SUPPLY — 71 items
BENZOIN TINCTURE PRP APPL 2/3 (GAUZE/BANDAGES/DRESSINGS) IMPLANT
BLADE 4.2CUDA (BLADE) ×3 IMPLANT
BLADE CUDA SHAVER 3.5 (BLADE) ×3 IMPLANT
BLADE CUTTER GATOR 3.5 (BLADE) IMPLANT
BLADE FLAT COURSE (BLADE) IMPLANT
BLADE GREAT WHITE 4.2 (BLADE) IMPLANT
BLADE SURG 11 STRL SS (BLADE) ×3 IMPLANT
BNDG COHESIVE 4X5 TAN NS LF (GAUZE/BANDAGES/DRESSINGS) ×3 IMPLANT
BUR OVAL 4.0 (BURR) IMPLANT
BUR VERTEX HOODED 4.5 (BURR) IMPLANT
CANISTER SUCT LVC 12 LTR MEDI- (MISCELLANEOUS) ×3 IMPLANT
CANISTER SUCTION 2500CC (MISCELLANEOUS) ×3 IMPLANT
CANNULA ACUFLEX KIT 5X76 (CANNULA) IMPLANT
CANNULA SHOULDER 7CM (CANNULA) ×3 IMPLANT
CANNULA TWIST IN 8.25X7CM (CANNULA) IMPLANT
CLEANER CAUTERY TIP 5X5 PAD (MISCELLANEOUS) IMPLANT
CLOTH BEACON ORANGE TIMEOUT ST (SAFETY) ×3 IMPLANT
DRAPE LG THREE QUARTER DISP (DRAPES) IMPLANT
DRAPE STERI 35X30 U-POUCH (DRAPES) ×3 IMPLANT
DRAPE U 20/CS (DRAPES) ×6 IMPLANT
DRSG ADAPTIC 3X8 NADH LF (GAUZE/BANDAGES/DRESSINGS) ×3 IMPLANT
DRSG PAD ABDOMINAL 8X10 ST (GAUZE/BANDAGES/DRESSINGS) ×3 IMPLANT
DURAPREP 26ML APPLICATOR (WOUND CARE) ×3 IMPLANT
ELECT MENISCUS 165MM 90D (ELECTRODE) IMPLANT
ELECT NEEDLE TIP 2.8 STRL (NEEDLE) IMPLANT
ELECT REM PT RETURN 9FT ADLT (ELECTROSURGICAL) ×3
ELECTRODE REM PT RTRN 9FT ADLT (ELECTROSURGICAL) ×2 IMPLANT
GLOVE BIO SURGEON STRL SZ 6.5 (GLOVE) ×3 IMPLANT
GLOVE ECLIPSE 6.0 STRL STRAW (GLOVE) ×3 IMPLANT
GLOVE INDICATOR 6.5 STRL GRN (GLOVE) ×3 IMPLANT
GLOVE INDICATOR 7.0 STRL GRN (GLOVE) ×3 IMPLANT
GLOVE SURG SS PI 8.0 STRL IVOR (GLOVE) ×3 IMPLANT
GOWN STRL NON-REIN LRG LVL3 (GOWN DISPOSABLE) ×6 IMPLANT
NDL SAFETY ECLIPSE 18X1.5 (NEEDLE) ×2 IMPLANT
NDL SUT 6 .5 CRC .975X.05 MAYO (NEEDLE) IMPLANT
NEEDLE 1/2 CIR CATGUT .05X1.09 (NEEDLE) IMPLANT
NEEDLE FILTER BLUNT 18X 1/2SAF (NEEDLE) ×1
NEEDLE FILTER BLUNT 18X1 1/2 (NEEDLE) ×2 IMPLANT
NEEDLE HYPO 18GX1.5 SHARP (NEEDLE) ×1
NEEDLE MAYO TAPER (NEEDLE)
NEEDLE SPNL 18GX3.5 QUINCKE PK (NEEDLE) ×3 IMPLANT
NS IRRIG 500ML POUR BTL (IV SOLUTION) IMPLANT
PACK BASIN DAY SURGERY FS (CUSTOM PROCEDURE TRAY) ×3 IMPLANT
PACK SHOULDER CUSTOM OPM052 (CUSTOM PROCEDURE TRAY) ×3 IMPLANT
PAD CLEANER CAUTERY TIP 5X5 (MISCELLANEOUS)
SET ARTHROSCOPY TUBING (MISCELLANEOUS) ×1
SET ARTHROSCOPY TUBING LN (MISCELLANEOUS) ×2 IMPLANT
SLING ARM FOAM STRAP LRG (SOFTGOODS) IMPLANT
SLING ULTRA II LARGE (SOFTGOODS) IMPLANT
SPONGE GAUZE 4X4 12PLY (GAUZE/BANDAGES/DRESSINGS) ×3 IMPLANT
STAPLER VISISTAT 35W (STAPLE) IMPLANT
STRIP CLOSURE SKIN 1/2X4 (GAUZE/BANDAGES/DRESSINGS) IMPLANT
SUT BONE WAX W31G (SUTURE) IMPLANT
SUT ETHIBOND 1 OS 2 18 CR3 (SUTURE) IMPLANT
SUT ETHIBOND 2 OS 4 DA (SUTURE) IMPLANT
SUT ETHILON 4 0 PS 2 18 (SUTURE) ×3 IMPLANT
SUT FIBERWIRE #2 38 REV NDL BL (SUTURE)
SUT MON AB 4-0 PC3 18 (SUTURE) IMPLANT
SUT PDS AB 0 CT 36 (SUTURE) IMPLANT
SUT PROLENE 3 0 PS 2 (SUTURE) IMPLANT
SUT VIC AB 1 CT1 36 (SUTURE) IMPLANT
SUT VIC AB 1-0 CT2 27 (SUTURE) IMPLANT
SUT VIC AB 2-0 CT2 27 (SUTURE) ×3 IMPLANT
SUT VICRYL 0 UR6 27IN ABS (SUTURE) IMPLANT
SUT VICRYL 0-0 OS 2 NEEDLE (SUTURE) IMPLANT
SUTURE FIBERWR#2 38 REV NDL BL (SUTURE) IMPLANT
SYRINGE 10CC LL (SYRINGE) ×3 IMPLANT
TAPE HYPAFIX 6X30 (GAUZE/BANDAGES/DRESSINGS) ×3 IMPLANT
TOWEL OR 17X24 6PK STRL BLUE (TOWEL DISPOSABLE) ×6 IMPLANT
WAND 90 DEG TURBOVAC W/CORD (SURGICAL WAND) ×3 IMPLANT
WATER STERILE IRR 500ML POUR (IV SOLUTION) ×3 IMPLANT

## 2010-12-18 NOTE — Brief Op Note (Signed)
12/18/2010  2:36 PM  PATIENT:  Karen Aguilar  45 y.o. female  PRE-OPERATIVE DIAGNOSIS:  ADHESIVE CAPSULITIS IMPINGEMENT SYNDROME LEFT SHOULDER  POST-OPERATIVE DIAGNOSIS:  adhesive capsulitis impingement syndrome   PROCEDURE:  Procedure(s): SHOULDER ARTHROSCOPY WITH SUBACROMIAL DECOMPRESSION  SURGEON:  Surgeon(s): Javier Docker  PHYSICIAN ASSISTANT:   ASSISTANTS: Strader   ANESTHESIA:   general  EBL:  Total I/O In: 200 [I.V.:200] Out: -   BLOOD ADMINISTERED:none  DRAINS: none   LOCAL MEDICATIONS USED:  MARCAINE 25CC  SPECIMEN:  No Specimen  DISPOSITION OF SPECIMEN:  N/A  COUNTS:  YES  TOURNIQUET:  * No tourniquets in log *  DICTATION: .Other Dictation: Dictation Number [id number]  PLAN OF CARE: Discharge to home after PACU  PATIENT DISPOSITION:  PACU - hemodynamically stable.   Delay start of Pharmacological VTE agent (>24hrs) due to surgical blood loss or risk of bleeding:  {YES/NO/NOT APPLICABLE:20182

## 2010-12-18 NOTE — Anesthesia Procedure Notes (Signed)
Procedure Name: Intubation Date/Time: 12/18/2010 1:28 PM Performed by: Maris Berger Pre-anesthesia Checklist: Patient identified, Emergency Drugs available, Suction available and Patient being monitored Patient Re-evaluated:Patient Re-evaluated prior to inductionOxygen Delivery Method: Circle System Utilized Preoxygenation: Pre-oxygenation with 100% oxygen Intubation Type: IV induction Ventilation: Mask ventilation without difficulty Laryngoscope Size: Mac and 3 Tube type: Oral Tube size: 7.0 mm Number of attempts: 1 Airway Equipment and Method: stylet and oral airway Placement Confirmation: ETT inserted through vocal cords under direct vision,  positive ETCO2 and breath sounds checked- equal and bilateral Secured at: 21 cm Tube secured with: Tape Dental Injury: Teeth and Oropharynx as per pre-operative assessment

## 2010-12-18 NOTE — H&P (Signed)
Karen Aguilar is an 45 y.o. female.   Chief Complaint left shoulder pain HPI: refractory adhesive capsulitis, impingement left shoulder  Past Medical History  Diagnosis Date  . PONV (postoperative nausea and vomiting)   . Hypothyroidism   . Sleep apnea     mild-doesn't use cpap  . Anemia     etiology unknown-iron infusion in past,last 9/12  . Fibromyalgia     DDD-s/p nerve damage after back surgery  . Headache     migraines  . Incontinence of urine     s/p sling procedure-unresolved  . DDD (degenerative disc disease), lumbar   . Neuropathy of lower extremity 2011  . GERD (gastroesophageal reflux disease)     zantac at times    Past Surgical History  Procedure Date  . Eye surgery     lasix  . Anterior cervical discectomy 2003  . Cholecystectomy 2001  . Tonsillectomy     as a child  . Repeat cesarean section [phone]  . Pubovaginal sling 2008  . Back surgery     multiple-L4-s1,hardware,fusion,removal,infection s/p 5/11 procedure    History reviewed. No pertinent family history. Social History:  reports that she has never smoked. She does not have any smokeless tobacco history on file. She reports that she drinks alcohol. Her drug history not on file.  Allergies:  Allergies  Allergen Reactions  . Adhesive (Tape)     blisters  . Thimerosal     Local reaction-red,swelling  . Erythromycin Rash  . Nitrofuran Derivatives Rash  . Sulfa Antibiotics Rash    Medications Prior to Admission  Medication Dose Route Frequency Provider Last Rate Last Dose  . ceFAZolin (ANCEF) IVPB 2 g/50 mL premix  2 g Intravenous 60 min Pre-Op Liam Graham, PA      . chlorhexidine (HIBICLENS) 4 % liquid 4 application  60 mL Topical Once Liam Graham, PA      . fentaNYL (SUBLIMAZE) injection 100 mcg  100 mcg Intravenous Once Einar Pheasant, MD   100 mcg at 12/18/10 1244  . lactated ringers infusion   Intravenous Continuous Azell Der, MD 100 mL/hr at 12/18/10 1157     . DISCONTD: lactated ringers infusion   Intravenous Continuous Liam Graham, PA       Medications Prior to Admission  Medication Sig Dispense Refill  . busPIRone (BUSPAR) 10 MG tablet Take 10 mg by mouth 3 (three) times daily. Once-twice daily       . escitalopram (LEXAPRO) 20 MG tablet Take 20 mg by mouth daily.        Marland Kitchen levothyroxine (SYNTHROID, LEVOTHROID) 50 MCG tablet Take 50 mcg by mouth daily.        . methadone (DOLOPHINE) 10 MG tablet Take 10 mg by mouth every 8 (eight) hours.        . nitrofurantoin (MACRODANTIN) 100 MG capsule Take 100 mg by mouth 4 (four) times daily.        Marland Kitchen oxymorphone (OPANA) 5 MG tablet Take 5 mg by mouth every 6 (six) hours as needed.        . thyroid (ARMOUR) 90 MG tablet Take 90 mg by mouth daily.        Marland Kitchen topiramate (TOPAMAX) 100 MG tablet Take 100 mg by mouth daily.        . ranitidine (ZANTAC) 150 MG capsule Take 150 mg by mouth 2 (two) times daily.          Results for orders placed during  the hospital encounter of 12/18/10 (from the past 48 hour(s))  POCT HEMOGLOBIN-HEMACUE     Status: Abnormal   Collection Time   12/18/10 12:01 PM      Component Value Range Comment   Hemoglobin 15.1 (*) 12.0 - 15.0 (g/dL)    No results found.  Review of Systems  Constitutional: Negative.   HENT: Negative.   Eyes: Negative.   Respiratory: Negative.   Cardiovascular: Negative.   Gastrointestinal: Negative.   Genitourinary: Negative.   Musculoskeletal: Positive for joint pain.       Left shoulder impingement. Decreased rotation.NVI  Skin: Negative.   Neurological: Positive for tingling.  Endo/Heme/Allergies: Negative.   Psychiatric/Behavioral: Negative.     Blood pressure 112/72, pulse 74, temperature 97.3 F (36.3 C), temperature source Oral, resp. rate 18, height 5\' 7"  (1.702 m), weight 86.183 kg (190 lb), last menstrual period 12/05/2010, SpO2 100.00%. Physical Exam  Constitutional: She appears well-developed.  HENT:  Head:  Normocephalic.  Eyes: Pupils are equal, round, and reactive to light.  Neck: Normal range of motion.  Cardiovascular: Normal rate.   Respiratory: Effort normal.  GI: Soft.  Musculoskeletal: She exhibits tenderness.       Decreased ROM left shoulder. NVI. Positive impingement.  Neurological: She is alert.  Skin: Skin is warm and dry.     Assessment/Plan Adhesive Capsulitis, impingement left shoulder. Plan L SA SAD MUA EUA. Risks and benefits discussed.  [name] C 12/18/2010, 1:13 PM

## 2010-12-18 NOTE — Anesthesia Preprocedure Evaluation (Addendum)
Anesthesia Evaluation  Patient identified by MRN, date of birth, ID band Patient awake    Reviewed: Allergy & Precautions, H&P , NPO status , Patient's Chart, lab work & pertinent test results  History of Anesthesia Complications (+) PONV  Airway Mallampati: II TM Distance: >3 FB Neck ROM: Full    Dental No notable dental hx. (+) Teeth Intact and Dental Advisory Given   Pulmonary neg pulmonary ROS, sleep apnea ,  clear to auscultation  Pulmonary exam normal       Cardiovascular neg cardio ROS Regular Normal    Neuro/Psych  Headaches, Chronic low back pain with narcotic dependence. Pain scale consistently 5/10 on medication.  Neuromuscular disease Negative Psych ROS   GI/Hepatic Neg liver ROS, GERD-  Medicated and Controlled,  Endo/Other  Hypothyroidism   Renal/GU negative Renal ROS  Genitourinary negative   Musculoskeletal  (+) Fibromyalgia -, narcotic dependent  Abdominal   Peds negative pediatric ROS (+)  Hematology negative hematology ROS (+)   Anesthesia Other Findings   Reproductive/Obstetrics negative OB ROS                          Anesthesia Physical Anesthesia Plan  ASA: III  Anesthesia Plan: General   Post-op Pain Management:    Induction:   Airway Management Planned: Oral ETT  Additional Equipment:   Intra-op Plan:   Post-operative Plan: Extubation in OR  Informed Consent: I have reviewed the patients History and Physical, chart, labs and discussed the procedure including the risks, benefits and alternatives for the proposed anesthesia with the patient or authorized representative who has indicated his/her understanding and acceptance.   Dental advisory given  Plan Discussed with: CRNA  Anesthesia Plan Comments:         Anesthesia Quick Evaluation

## 2010-12-18 NOTE — Transfer of Care (Signed)
Immediate Anesthesia Transfer of Care Note  Patient: Karen Aguilar  Procedure(s) Performed:  SHOULDER ARTHROSCOPY WITH SUBACROMIAL DECOMPRESSION; CLOSED MANIPULATION SHOULDER - labrial debridement  Patient Location: PACU  Anesthesia Type: General  Level of Consciousness: awake and oriented  Airway & Oxygen Therapy: Patient Spontanous Breathing and Patient connected to nasal cannula oxygen  Post-op Assessment: Post -op Vital signs reviewed and stable  Post vital signs: Reviewed and stable  Complications: No apparent anesthesia complications

## 2010-12-18 NOTE — Op Note (Signed)
Karen Aguilar, Karen Aguilar                ACCOUNT NO.:  [id number]  MEDICAL RECORD NO.:  [id number]  LOCATION:                               FACILITY:  [facility]  PHYSICIAN:  Jene Every, M.D.    DATE OF BIRTH:  August 16, 1965  DATE OF PROCEDURE: DATE OF DISCHARGE:                              OPERATIVE REPORT   PREOPERATIVE DIAGNOSIS:  Adhesive capsulitis, impingement syndrome, labral tear left shoulder.  POSTOPERATIVE DIAGNOSIS:  Adhesive capsulitis, impingement syndrome, labral tear left shoulder.  PROCEDURES PERFORMED: 1. Exam followed by manipulation under anesthesia. 2. Left shoulder arthroscopy with debridement of anterior labrum. 3. Partial tear of the rotator cuff. 4. Subacromial decompression. 5. Acromioplasty. 6. Bursectomy.  ANESTHESIA:  General.  ASSISTANT:  Roma Schanz, P.A.  BRIEF HISTORY AND INDICATION:  This is a 45 year old, who has had neck and shoulder pain.  She has had cervical fusion, adhesive capsulitis, rotator cuff arthropathy refractory to conservative treatment, including corticosteroid injections, physical therapy, activity modification, MRI indicating labral tearing, signs and symptoms consistent with adhesive capsulitis.  She was indicated for exam followed by manipulation of the shoulder or shoulder arthroscopy, subacromial decompression, and bursectomy.  She had a primary impingement syndrome.  We will evaluate the labral region as well.  Risk and benefits discussed including bleeding, infection, damage to vascular structures.  No change in symptoms, worsening symptoms, need for repeat manipulation, DVT, PE, anesthetic complications, etc.  TECHNIQUE:  The patient in supine position.  After induction of adequate anesthesia and IV antibiotics, the patient was examined under anesthesia.  She had only 60 degrees of abduction, forward flexion of 90 degrees with the shoulder and scapulothoracic region stabilized.  She was manipulated to 160 to 170  degrees of forward flexion.  Consistent with that seen on the contralateral side.  Abduction was manipulated to 130 degrees consistent with that on the right shoulder.  She had slightly decreased internal rotation and external rotation.  Both were improved by gentle manipulative force grasping the humerus proximally stabilizing  the scapulothoracic region all within the head into the neck in the neutral position.  There was no traction applied to the upper extremity without stabilizing the glenohumeral joint.  Achieved essentially full range of motion, therefore put her in the upright beach chair position.  We prepped and draped the left upper extremity in usual sterile fashion.  We used a surgical marker.  Delineate the acromion, AC joint, and coracoid.  Standard posterolateral and anterolateral portals were utilized with incision through the skin only with 11 blade with the arm in 70/30 position.  Gentle traction applied by the assistant Roma Schanz.  Advanced arthroscopic camera into the glenohumeral joint, penetrating atraumatically in line with coracoid.  Then we copiously lavaged the joint.  Under direct visualization, an 18-gauge needle was utilized to localize an anterior portal just beneath the biceps tendon and between the coracoid and the anterolateral aspect of the acromion, closed into the lateral.  We introduced the cannula, and a shaver and debrided the joint, some tearing of the anterior labrum and superior labrum.  This debrided to a stable base.  Biceps tendon was intact.  Just of the lateral aspect of the  biceps tendon,  the supraspinatus area was attached to the humerus, there was a small tear in the rotator cuff.  This was debrided to a stable base of the shaver. Subscapularis was unremarkable.  The remainder of the rotator cuff was unremarkable, was probed extensively.  Minimal degenerative changes of the glenohumeral joint was noted.  This is a very small area  of tearing may be 3 mm x2 mm.  I then redirected the camera in the subacromial space, located the anterior portal to the anterolateral portal with incision through the skin only.  In the subacromial space was exuberant for lipid proliferative bursa and synovitis.  We performed a full extensive bursectomy anteriorly, posteriorly, laterally, and cephalad with the arm in 0 to 30 position.  Rotator cuff was done torn.  Also released the CA ligament.  It was a decreased acromial humeral distance reasons introduced a shaver and removed any extra debris.  She had no AC joint symptoms, this was left alone.  After full inspection probing the cuff, there is no evidence of tear.  We therefore removed all instrumentation.  Portals were closed with 4-0 nylon simple sutures. 0.25% Marcaine with epinephrine was infiltrated and the joint was dressed sterilely.  Awoken without difficulty.  Placed in a sling and was extubated and transported to recovery room in satisfactory condition.  The patient tolerated procedure well.  No complications.  Assistant was [employer], loss or intermittent gentle traction in the upper extremity, flow of the arthroscopic irrigant.     Jene Every, M.D.     Cordelia Pen  D:  12/18/2010  T:  12/18/2010  Job:  [id number]

## 2010-12-18 NOTE — Anesthesia Postprocedure Evaluation (Signed)
  Anesthesia Post-op Note  Patient: Karen Aguilar  Procedure(s) Performed:  SHOULDER ARTHROSCOPY WITH SUBACROMIAL DECOMPRESSION; CLOSED MANIPULATION SHOULDER - labrial debridement  Patient Location: PACU  Anesthesia Type: General  Level of Consciousness: awake and alert   Airway and Oxygen Therapy: Patient Spontanous Breathing  Post-op Pain: mild  Post-op Assessment: Post-op Vital signs reviewed, Patient's Cardiovascular Status Stable, Respiratory Function Stable, Patent Airway and No signs of Nausea or vomiting  Post-op Vital Signs: stable  Complications: No apparent anesthesia complications

## 2010-12-21 ENCOUNTER — Encounter (HOSPITAL_BASED_OUTPATIENT_CLINIC_OR_DEPARTMENT_OTHER): Payer: Self-pay | Admitting: Specialist

## 2011-02-02 ENCOUNTER — Encounter (HOSPITAL_COMMUNITY): Payer: Self-pay | Admitting: Pharmacy Technician

## 2011-02-07 NOTE — Pre-Procedure Instructions (Addendum)
20 Karen Aguilar  02/07/2011   Your procedure is scheduled on:FEB 15 Report to [facility] at 0530 AM.  Call this number if you have problems the morning of surgery: [phone]   Remember:   Do not eat food:After Midnight.  May have clear liquids: up to 4 Hours before arrival.1:30 AM  Clear liquids include soda, tea, black coffee, apple or grape juice, broth.  Take these medicines the morning of surgery with A SIP OF WATER:BUSRIRONE,LEXAPRO,SYNTHROID,OPANA,ZANTAC,TOPIRAMATE,THYROID,methadone  Do not wear jewelry, make-up or nail polish.  Do not wear lotions, powders, or perfumes. You may wear deodorant.  Do not shave 48 hours prior to surgery.  Do not bring valuables to the hospital.  Contacts, dentures or bridgework may not be worn into surgery.  Leave suitcase in the car. After surgery it may be brought to your room.  For patients admitted to the hospital, checkout time is 11:00 AM the day of discharge.   Patients discharged the day of surgery will not be allowed to drive home.  Name and phone number of your driver: FAMILY  Special Instructions: CHG Shower Shower 2 days before surgery and 1 day before surgery with Hibiclens.   Please read over the following fact sheets that you were given: Blood Transfusion Information, MRSA Information and Surgical Site Infection Prevention

## 2011-02-08 ENCOUNTER — Encounter (HOSPITAL_COMMUNITY)
Admission: RE | Admit: 2011-02-08 | Discharge: 2011-02-08 | Disposition: A | Discharge status: Home / Self Care | Payer: BC Managed Care – PPO | Source: Ambulatory Visit | Attending: Neurosurgery | Admitting: Neurosurgery

## 2011-02-08 ENCOUNTER — Encounter (HOSPITAL_COMMUNITY): Payer: Self-pay

## 2011-02-08 HISTORY — DX: Other chronic pain: G89.29

## 2011-02-08 LAB — COMPREHENSIVE METABOLIC PANEL
ALT: 17 U/L (ref 0–35)
Albumin: 3.8 g/dL (ref 3.5–5.2)
Alkaline Phosphatase: 55 U/L (ref 39–117)
Potassium: 4.1 mEq/L (ref 3.5–5.1)
Sodium: 139 mEq/L (ref 135–145)
Total Protein: 6.5 g/dL (ref 6.0–8.3)

## 2011-02-08 LAB — SURGICAL PCR SCREEN
MRSA, PCR: NEGATIVE
Staphylococcus aureus: NEGATIVE

## 2011-02-08 LAB — CBC
Hemoglobin: 13.7 g/dL (ref 12.0–15.0)
MCHC: 34.3 g/dL (ref 30.0–36.0)
RDW: 13.3 % (ref 11.5–15.5)

## 2011-02-15 MED ORDER — CEFAZOLIN SODIUM-DEXTROSE 2-3 GM-% IV SOLR
2.0000 g | INTRAVENOUS | Status: AC
  Administered 2011-02-16: 2 g via INTRAVENOUS
  Filled 2011-02-15: qty 50

## 2011-02-16 ENCOUNTER — Inpatient Hospital Stay (HOSPITAL_COMMUNITY)
Admission: RE | Admit: 2011-02-16 | Discharge: 2011-02-20 | DRG: 837 | Disposition: A | Discharge status: Home Health | Payer: BC Managed Care – PPO | Source: Ambulatory Visit | Attending: Neurosurgery | Admitting: Neurosurgery

## 2011-02-16 ENCOUNTER — Ambulatory Visit (HOSPITAL_COMMUNITY): Payer: BC Managed Care – PPO | Admitting: Anesthesiology

## 2011-02-16 ENCOUNTER — Encounter (HOSPITAL_COMMUNITY): Payer: Self-pay | Admitting: Anesthesiology

## 2011-02-16 ENCOUNTER — Encounter (HOSPITAL_COMMUNITY): Payer: Self-pay | Admitting: *Deleted

## 2011-02-16 ENCOUNTER — Encounter (HOSPITAL_COMMUNITY)
Admission: RE | Disposition: A | Discharge status: Home Health | Payer: Self-pay | Source: Ambulatory Visit | Attending: Neurosurgery

## 2011-02-16 ENCOUNTER — Ambulatory Visit (HOSPITAL_COMMUNITY): Payer: BC Managed Care – PPO

## 2011-02-16 ENCOUNTER — Encounter (HOSPITAL_COMMUNITY): Payer: Self-pay | Admitting: General Practice

## 2011-02-16 DIAGNOSIS — T85695A Other mechanical complication of other nervous system device, implant or graft, initial encounter: Principal | ICD-10-CM | POA: Diagnosis present

## 2011-02-16 DIAGNOSIS — Z01812 Encounter for preprocedural laboratory examination: Secondary | ICD-10-CM

## 2011-02-16 DIAGNOSIS — Z981 Arthrodesis status: Secondary | ICD-10-CM

## 2011-02-16 DIAGNOSIS — Y849 Medical procedure, unspecified as the cause of abnormal reaction of the patient, or of later complication, without mention of misadventure at the time of the procedure: Secondary | ICD-10-CM | POA: Diagnosis present

## 2011-02-16 HISTORY — PX: BACK SURGERY: SHX140

## 2011-02-16 LAB — TYPE AND SCREEN

## 2011-02-16 SURGERY — POSTERIOR LUMBAR FUSION 1 LEVEL
Anesthesia: General | Site: Back | Wound class: Clean

## 2011-02-16 MED ORDER — DOCUSATE SODIUM 100 MG PO CAPS
100.0000 mg | ORAL_CAPSULE | Freq: Two times a day (BID) | ORAL | Status: DC
  Administered 2011-02-16 – 2011-02-19 (×8): 100 mg via ORAL
  Filled 2011-02-16 (×7): qty 1

## 2011-02-16 MED ORDER — SODIUM CHLORIDE 0.9 % IV SOLN
INTRAVENOUS | Status: AC
  Filled 2011-02-16: qty 500

## 2011-02-16 MED ORDER — THROMBIN 20000 UNITS EX KIT
PACK | CUTANEOUS | Status: DC | PRN
  Administered 2011-02-16: 09:00:00 via TOPICAL

## 2011-02-16 MED ORDER — THYROID 60 MG PO TABS: 90.0000 mg | ORAL_TABLET | Freq: Every day | ORAL | Status: DC

## 2011-02-16 MED ORDER — CEFAZOLIN SODIUM 1-5 GM-% IV SOLN
1.0000 g | Freq: Three times a day (TID) | INTRAVENOUS | Status: AC
  Administered 2011-02-16 – 2011-02-17 (×2): 1 g via INTRAVENOUS
  Filled 2011-02-16 (×3): qty 50

## 2011-02-16 MED ORDER — TOPIRAMATE 100 MG PO TABS
100.0000 mg | ORAL_TABLET | Freq: Every day | ORAL | Status: DC
  Administered 2011-02-17 – 2011-02-19 (×3): 100 mg via ORAL
  Filled 2011-02-16 (×4): qty 1

## 2011-02-16 MED ORDER — HYDROMORPHONE HCL PF 1 MG/ML IJ SOLN
0.2500 mg | INTRAMUSCULAR | Status: DC | PRN
  Administered 2011-02-16 (×2): 0.5 mg via INTRAVENOUS

## 2011-02-16 MED ORDER — ACETAMINOPHEN 650 MG RE SUPP: 650.0000 mg | RECTAL | Status: DC | PRN

## 2011-02-16 MED ORDER — CYCLOBENZAPRINE HCL 10 MG PO TABS
10.0000 mg | ORAL_TABLET | Freq: Three times a day (TID) | ORAL | Status: DC | PRN
  Administered 2011-02-16 – 2011-02-20 (×10): 10 mg via ORAL
  Filled 2011-02-16 (×10): qty 1

## 2011-02-16 MED ORDER — FENTANYL CITRATE 0.05 MG/ML IJ SOLN
INTRAMUSCULAR | Status: DC | PRN
  Administered 2011-02-16 (×3): 50 ug via INTRAVENOUS
  Administered 2011-02-16: 150 ug via INTRAVENOUS
  Administered 2011-02-16: 50 ug via INTRAVENOUS
  Administered 2011-02-16: 100 ug via INTRAVENOUS

## 2011-02-16 MED ORDER — BACITRACIN 50000 UNITS IM SOLR
INTRAMUSCULAR | Status: AC
  Filled 2011-02-16: qty 1

## 2011-02-16 MED ORDER — HEMOSTATIC AGENTS (NO CHARGE) OPTIME
TOPICAL | Status: DC | PRN
  Administered 2011-02-16: 1 via TOPICAL

## 2011-02-16 MED ORDER — LACTATED RINGERS IV SOLN
INTRAVENOUS | Status: DC | PRN
  Administered 2011-02-16 (×3): via INTRAVENOUS

## 2011-02-16 MED ORDER — ESCITALOPRAM OXALATE 20 MG PO TABS
30.0000 mg | ORAL_TABLET | Freq: Every day | ORAL | Status: DC
  Administered 2011-02-17 – 2011-02-19 (×3): 30 mg via ORAL
  Filled 2011-02-16 (×4): qty 1

## 2011-02-16 MED ORDER — SODIUM CHLORIDE 0.9 % IJ SOLN
3.0000 mL | Freq: Two times a day (BID) | INTRAMUSCULAR | Status: DC
  Administered 2011-02-16 – 2011-02-19 (×5): 3 mL via INTRAVENOUS

## 2011-02-16 MED ORDER — SODIUM CHLORIDE 0.9 % IR SOLN
Status: DC | PRN
  Administered 2011-02-16 (×2)

## 2011-02-16 MED ORDER — ONDANSETRON HCL 4 MG/2ML IJ SOLN: 4.0000 mg | INTRAMUSCULAR | Status: DC | PRN

## 2011-02-16 MED ORDER — METHADONE HCL 10 MG PO TABS
10.0000 mg | ORAL_TABLET | Freq: Four times a day (QID) | ORAL | Status: DC
  Administered 2011-02-16 – 2011-02-20 (×14): 10 mg via ORAL
  Filled 2011-02-16 (×14): qty 1

## 2011-02-16 MED ORDER — FAMOTIDINE 10 MG PO TABS
10.0000 mg | ORAL_TABLET | Freq: Every day | ORAL | Status: DC
  Administered 2011-02-17 – 2011-02-19 (×3): 10 mg via ORAL
  Filled 2011-02-16 (×4): qty 1

## 2011-02-16 MED ORDER — BUSPIRONE HCL 5 MG PO TABS
5.0000 mg | ORAL_TABLET | Freq: Two times a day (BID) | ORAL | Status: DC
  Administered 2011-02-16 – 2011-02-19 (×7): 5 mg via ORAL
  Filled 2011-02-16 (×10): qty 1

## 2011-02-16 MED ORDER — NEOSTIGMINE METHYLSULFATE 1 MG/ML IJ SOLN
INTRAMUSCULAR | Status: DC | PRN
  Administered 2011-02-16: 3 mg via INTRAVENOUS

## 2011-02-16 MED ORDER — HYDROMORPHONE HCL PF 1 MG/ML IJ SOLN
INTRAMUSCULAR | Status: AC
  Administered 2011-02-16: 0.5 mg via INTRAVENOUS
  Filled 2011-02-16: qty 1

## 2011-02-16 MED ORDER — MORPHINE SULFATE 15 MG PO TABS
30.0000 mg | ORAL_TABLET | ORAL | Status: DC | PRN
  Administered 2011-02-16 – 2011-02-20 (×11): 30 mg via ORAL
  Filled 2011-02-16 (×11): qty 2

## 2011-02-16 MED ORDER — MEPERIDINE HCL 25 MG/ML IJ SOLN: 12.5000 mg | Freq: Once | INTRAMUSCULAR | Status: DC

## 2011-02-16 MED ORDER — ONDANSETRON HCL 4 MG/2ML IJ SOLN: 4.0000 mg | Freq: Once | INTRAMUSCULAR | Status: DC | PRN

## 2011-02-16 MED ORDER — MENTHOL 3 MG MT LOZG
1.0000 | LOZENGE | OROMUCOSAL | Status: DC | PRN
  Filled 2011-02-16 (×2): qty 9

## 2011-02-16 MED ORDER — LEVOTHYROXINE SODIUM 50 MCG PO TABS
50.0000 ug | ORAL_TABLET | Freq: Every day | ORAL | Status: DC
  Administered 2011-02-17 – 2011-02-20 (×4): 50 ug via ORAL
  Filled 2011-02-16 (×6): qty 1

## 2011-02-16 MED ORDER — ADULT MULTIVITAMIN W/MINERALS CH
1.0000 | ORAL_TABLET | Freq: Every day | ORAL | Status: DC
  Administered 2011-02-17 – 2011-02-19 (×3): 1 via ORAL
  Filled 2011-02-16 (×4): qty 1

## 2011-02-16 MED ORDER — MIDAZOLAM HCL 5 MG/5ML IJ SOLN
INTRAMUSCULAR | Status: DC | PRN
  Administered 2011-02-16: 2 mg via INTRAVENOUS

## 2011-02-16 MED ORDER — ACETAMINOPHEN 325 MG PO TABS
650.0000 mg | ORAL_TABLET | ORAL | Status: DC | PRN
  Administered 2011-02-18 – 2011-02-20 (×7): 650 mg via ORAL
  Filled 2011-02-16 (×7): qty 2

## 2011-02-16 MED ORDER — PHENOL 1.4 % MT LIQD: 1.0000 | OROMUCOSAL | Status: DC | PRN

## 2011-02-16 MED ORDER — THYROID 60 MG PO TABS
90.0000 mg | ORAL_TABLET | Freq: Every day | ORAL | Status: DC
  Administered 2011-02-17 – 2011-02-20 (×4): 90 mg via ORAL
  Filled 2011-02-16 (×6): qty 1

## 2011-02-16 MED ORDER — BUPIVACAINE HCL (PF) 0.25 % IJ SOLN
INTRAMUSCULAR | Status: DC | PRN
  Administered 2011-02-16: 20 mL

## 2011-02-16 MED ORDER — ROCURONIUM BROMIDE 100 MG/10ML IV SOLN
INTRAVENOUS | Status: DC | PRN
  Administered 2011-02-16: 50 mg via INTRAVENOUS
  Administered 2011-02-16 (×4): 10 mg via INTRAVENOUS

## 2011-02-16 MED ORDER — MEPERIDINE HCL 50 MG/ML IJ SOLN
INTRAMUSCULAR | Status: AC
  Administered 2011-02-16: 12.5 mg
  Filled 2011-02-16: qty 1

## 2011-02-16 MED ORDER — GLYCOPYRROLATE 0.2 MG/ML IJ SOLN
INTRAMUSCULAR | Status: DC | PRN
  Administered 2011-02-16: .6 mg via INTRAVENOUS

## 2011-02-16 MED ORDER — LIDOCAINE-EPINEPHRINE 1 %-1:100000 IJ SOLN
INTRAMUSCULAR | Status: DC | PRN
  Administered 2011-02-16: 20 mL

## 2011-02-16 MED ORDER — 0.9 % SODIUM CHLORIDE (POUR BTL) OPTIME
TOPICAL | Status: DC | PRN
  Administered 2011-02-16: 1000 mL

## 2011-02-16 MED ORDER — NITROFURANTOIN MACROCRYSTAL 100 MG PO CAPS
100.0000 mg | ORAL_CAPSULE | Freq: Every day | ORAL | Status: DC
  Administered 2011-02-17 – 2011-02-19 (×3): 100 mg via ORAL
  Filled 2011-02-16 (×4): qty 1

## 2011-02-16 MED ORDER — HYDROMORPHONE HCL PF 1 MG/ML IJ SOLN
0.5000 mg | INTRAMUSCULAR | Status: DC | PRN
  Administered 2011-02-16: 0.5 mg via INTRAVENOUS
  Administered 2011-02-16 – 2011-02-20 (×19): 1 mg via INTRAVENOUS
  Filled 2011-02-16 (×19): qty 1

## 2011-02-16 MED ORDER — ONDANSETRON HCL 4 MG/2ML IJ SOLN
INTRAMUSCULAR | Status: DC | PRN
  Administered 2011-02-16: 4 mg via INTRAVENOUS

## 2011-02-16 MED ORDER — PROPOFOL 10 MG/ML IV EMUL
INTRAVENOUS | Status: DC | PRN
  Administered 2011-02-16: 200 mg via INTRAVENOUS

## 2011-02-16 SURGICAL SUPPLY — 70 items
8.5mm x 45mm Polyaxial screw ×2 IMPLANT
BAG DECANTER FOR FLEXI CONT (MISCELLANEOUS) ×4 IMPLANT
BENZOIN TINCTURE PRP APPL 2/3 (GAUZE/BANDAGES/DRESSINGS) ×2 IMPLANT
BLADE SURG 11 STRL SS (BLADE) ×2 IMPLANT
BLADE SURG ROTATE 9660 (MISCELLANEOUS) IMPLANT
BRUSH SCRUB EZ PLAIN DRY (MISCELLANEOUS) ×2 IMPLANT
BUR MATCHSTICK NEURO 3.0 LAGG (BURR) ×2 IMPLANT
BUR PRECISION FLUTE 6.0 (BURR) ×2 IMPLANT
CANISTER SUCTION 2500CC (MISCELLANEOUS) ×2 IMPLANT
CLOSURE STERI STRIP 1/2 X4 (GAUZE/BANDAGES/DRESSINGS) ×2 IMPLANT
CLOTH BEACON ORANGE TIMEOUT ST (SAFETY) ×2 IMPLANT
CONT SPEC 4OZ CLIKSEAL STRL BL (MISCELLANEOUS) ×6 IMPLANT
COVER BACK TABLE 24X17X13 BIG (DRAPES) ×2 IMPLANT
COVER TABLE BACK 60X90 (DRAPES) ×2 IMPLANT
DECANTER SPIKE VIAL GLASS SM (MISCELLANEOUS) ×2 IMPLANT
DERMABOND ADHESIVE PROPEN (GAUZE/BANDAGES/DRESSINGS) ×1
DERMABOND ADVANCED (GAUZE/BANDAGES/DRESSINGS) ×1
DERMABOND ADVANCED .7 DNX12 (GAUZE/BANDAGES/DRESSINGS) ×1 IMPLANT
DERMABOND ADVANCED .7 DNX6 (GAUZE/BANDAGES/DRESSINGS) ×1 IMPLANT
DRAPE C-ARM 42X72 X-RAY (DRAPES) ×4 IMPLANT
DRAPE LAPAROTOMY 100X72X124 (DRAPES) ×2 IMPLANT
DRAPE POUCH INSTRU U-SHP 10X18 (DRAPES) ×2 IMPLANT
DRAPE PROXIMA HALF (DRAPES) IMPLANT
DRAPE SURG 17X23 STRL (DRAPES) ×2 IMPLANT
DRSG OPSITE 4X5.5 SM (GAUZE/BANDAGES/DRESSINGS) ×4 IMPLANT
ELECT REM PT RETURN 9FT ADLT (ELECTROSURGICAL) ×2
ELECTRODE REM PT RTRN 9FT ADLT (ELECTROSURGICAL) ×1 IMPLANT
EVACUATOR 1/8 PVC DRAIN (DRAIN) ×2 IMPLANT
EVACUATOR 3/16  PVC DRAIN (DRAIN) ×1
EVACUATOR 3/16 PVC DRAIN (DRAIN) ×1 IMPLANT
GAUZE SPONGE 4X4 12PLY STRL LF (GAUZE/BANDAGES/DRESSINGS) ×2 IMPLANT
GAUZE SPONGE 4X4 16PLY XRAY LF (GAUZE/BANDAGES/DRESSINGS) ×2 IMPLANT
GLOVE BIO SURGEON STRL SZ8 (GLOVE) ×4 IMPLANT
GLOVE ECLIPSE 7.5 STRL STRAW (GLOVE) IMPLANT
GLOVE ECLIPSE 8.5 STRL (GLOVE) ×2 IMPLANT
GLOVE EXAM NITRILE LRG STRL (GLOVE) IMPLANT
GLOVE EXAM NITRILE MD LF STRL (GLOVE) ×4 IMPLANT
GLOVE EXAM NITRILE XL STR (GLOVE) IMPLANT
GLOVE EXAM NITRILE XS STR PU (GLOVE) IMPLANT
GLOVE INDICATOR 8.5 STRL (GLOVE) ×4 IMPLANT
GOWN BRE IMP SLV AUR LG STRL (GOWN DISPOSABLE) ×6 IMPLANT
GOWN BRE IMP SLV AUR XL STRL (GOWN DISPOSABLE) ×8 IMPLANT
GOWN STRL REIN 2XL LVL4 (GOWN DISPOSABLE) IMPLANT
KIT BASIN OR (CUSTOM PROCEDURE TRAY) ×2 IMPLANT
KIT INFUSE X SMALL 1.4CC (Orthopedic Implant) ×2 IMPLANT
KIT ROOM TURNOVER OR (KITS) ×2 IMPLANT
MASTERGRAFT STRIP 10CM ×2 IMPLANT
MATRIX MASTERGRAFT STRIP 10CM ×1 IMPLANT
NEEDLE HYPO 25X1 1.5 SAFETY (NEEDLE) ×2 IMPLANT
NS IRRIG 1000ML POUR BTL (IV SOLUTION) ×2 IMPLANT
PACK LAMINECTOMY NEURO (CUSTOM PROCEDURE TRAY) ×2 IMPLANT
PAD ARMBOARD 7.5X6 YLW CONV (MISCELLANEOUS) ×6 IMPLANT
SCREW 40MM (Screw) ×2 IMPLANT
SCREW SET (Screw) ×6 IMPLANT
SCREW SET 8.5 (Screw) ×2 IMPLANT
SPONGE GAUZE 4X4 12PLY (GAUZE/BANDAGES/DRESSINGS) IMPLANT
SPONGE LAP 4X18 X RAY DECT (DISPOSABLE) IMPLANT
SPONGE SURGIFOAM ABS GEL 100 (HEMOSTASIS) ×4 IMPLANT
STRIP CLOSURE SKIN 1/2X4 (GAUZE/BANDAGES/DRESSINGS) ×2 IMPLANT
SUT VIC AB 0 CT1 18XCR BRD8 (SUTURE) ×3 IMPLANT
SUT VIC AB 0 CT1 8-18 (SUTURE) ×3
SUT VIC AB 2-0 CT1 18 (SUTURE) ×6 IMPLANT
SUT VICRYL 4-0 PS2 18IN ABS (SUTURE) ×4 IMPLANT
SYR 20ML ECCENTRIC (SYRINGE) ×2 IMPLANT
TAPE PAPER 2X10 WHT MICROPORE (GAUZE/BANDAGES/DRESSINGS) ×4 IMPLANT
TOWEL OR 17X24 6PK STRL BLUE (TOWEL DISPOSABLE) ×2 IMPLANT
TOWEL OR 17X26 10 PK STRL BLUE (TOWEL DISPOSABLE) ×2 IMPLANT
TRAY FOLEY CATH 14FRSI W/METER (CATHETERS) ×2 IMPLANT
TUBE CONNECTING 12X1/4 (SUCTIONS) ×2 IMPLANT
WATER STERILE IRR 1000ML POUR (IV SOLUTION) ×2 IMPLANT

## 2011-02-16 NOTE — Preoperative (Signed)
Beta Blockers   Reason not to administer Beta Blockers:Not Applicable 

## 2011-02-16 NOTE — Anesthesia Preprocedure Evaluation (Signed)
Anesthesia Evaluation  Patient identified by MRN, date of birth, ID band Patient awake    Reviewed: Allergy & Precautions, H&P , NPO status , Patient's Chart, lab work & pertinent test results  Airway Mallampati: II TM Distance: >3 FB Neck ROM: full    Dental  (+) Teeth Intact   Pulmonary sleep apnea ,          Cardiovascular regular Normal    Neuro/Psych  Headaches,  Neuromuscular disease    GI/Hepatic GERD-  ,  Endo/Other    Renal/GU      Musculoskeletal   Abdominal   Peds  Hematology   Anesthesia Other Findings   Reproductive/Obstetrics                           Anesthesia Physical Anesthesia Plan  ASA: II  Anesthesia Plan: General ETT   Post-op Pain Management:    Induction: Intravenous  Airway Management Planned: Oral ETT  Additional Equipment:   Intra-op Plan:   Post-operative Plan:   Informed Consent: I have reviewed the patients History and Physical, chart, labs and discussed the procedure including the risks, benefits and alternatives for the proposed anesthesia with the patient or authorized representative who has indicated his/her understanding and acceptance.     Plan Discussed with: Anesthesiologist, CRNA and Surgeon  Anesthesia Plan Comments:         Anesthesia Quick Evaluation

## 2011-02-16 NOTE — Transfer of Care (Signed)
Immediate Anesthesia Transfer of Care Note  Patient: Karen Aguilar  Procedure(s) Performed: Procedure(s) (LRB): POSTERIOR LUMBAR FUSION 1 LEVEL (N/A)  Patient Location: PACU  Anesthesia Type: General  Level of Consciousness: awake, alert  and oriented  Airway & Oxygen Therapy: Patient Spontanous Breathing and Patient connected to nasal cannula oxygen  Post-op Assessment: Report given to PACU RN and Post -op Vital signs reviewed and stable  Post vital signs: Reviewed and stable  Complications: No apparent anesthesia complications

## 2011-02-16 NOTE — Anesthesia Procedure Notes (Signed)
Procedure Name: Intubation Date/Time: 02/16/2011 7:52 AM Performed by: Gwenyth Allegra Pre-anesthesia Checklist: Patient identified, Timeout performed, Emergency Drugs available, Suction available and Patient being monitored Patient Re-evaluated:Patient Re-evaluated prior to inductionOxygen Delivery Method: Circle System Utilized Preoxygenation: Pre-oxygenation with 100% oxygen Intubation Type: IV induction Ventilation: Mask ventilation without difficulty Laryngoscope Size: Mac and 3 Grade View: Grade II Tube size: 7.0 mm Number of attempts: 1 Airway Equipment and Method: stylet Placement Confirmation: ETT inserted through vocal cords under direct vision,  positive ETCO2 and breath sounds checked- equal and bilateral Secured at: 22 cm Tube secured with: Tape

## 2011-02-16 NOTE — Op Note (Signed)
Preoperative diagnosis: Pseudoarthrosis L4-5  Postoperative diagnosis: Pseudoarthrosis L4-5  Procedure: Exploration of fusion removal of hardware L4-S1, harvesting of right iliac crest bone graft, redo posterior lateral fusion L4-5 using iliac crest bone graft BMP and master graft replacement of bilateral L4 screws with a Telfa tech 7.5 x 40 mm screw on the right and 8.5 x 40 mm screw on the left placement of a large Hemovac drain  Surgeon: Jillyn Hidden Ngai Parcell  Assistant: Sherilyn Cooter pool  Anesthesia: Gen.  EBL: Less than 200  History of present illness: Patient is a 46 year old female whose had multiple previous back operations and has developed a pseudoarthrosis at L4-5 patient been refractory to all forms of conservative treatment imaging revealed loosening of her hardware at L4 and incomplete fusion posterior laterally L4-5 as well as an incomplete TLIF incorperation L4-5. Patient failed all forms of conservative treatment was recommended reexploration of  fusion with removal of loose hardware concluded bilateral L4 screws and a left-sided S1 screw. And redo posterior lateral fusion with iliac crest bone graft at L4-5. Risks benefits of the operation perioperative course and expectations of outcome and alternatives surgery also the patient she understood and agreed to proceed forward.  Operative procedure: This was brought into the or was induced under general anesthesia and positioned prone on the Wilson frame her back was prepped and draped in routine sterile fashion. Her old incision was identified in the midline as well as a new incision was drawn out over her right posterior superior iliac crest. First working at the iliac crest bone graft harvest, the iliac crest incision was incised and dissection was carried down to the posterior superior iliac crest the lateral wing was exposed and using a combination of chisel and gouges the lateral cortex was removed and cancellous bone was harvested from the right  posterior iliac crest after adequate bone graft harvested is packed with Gelfoam copiously irrigated drain was placed and was closed with interrupted Vicryl for subcutaneous and skin. Attention was then taken the midline incision which after infiltration 10 cc lidocaine with epi was incised the scar tissues dissected free the spinous process of L3 was identified using as a landmark dissections care laterally until hardware was exposed the there was extensive amount of very dense scar in the identified this was dissected off of the hardware and the nuts were then removed the rod was dissected free and the rod was removed at L4 screws were noted be hypermobile very loose and these are both removed left S1 screw which was also loose. Dissecting out there is an early extensive amount of posterior lateral bone at L4 on the left this was densely fused to the TPS L4 however he still EBL and a below the L5 to the it did not appear adequately fused to a vertebral bodies have a below the to the dissection was carried out exposing the TPS if I go through some of the [place] of bone exposure to allow incorporation of the L5 TP at that level to it and was carried on the patient's right side and again appeared to be solid bony fusion the facet complex at L4 into this island of bone over no complete incorporation into the L5 TP out of the L5 TP was identified and a area was made to communicate with this island wounds were then copiously irrigated and aggressive decortication was care MTPs and lateral gutters and into the [place] of bone. The iliac crest bone graft as well as the BMP sponges and  master graft was then packed from the L5 TP up into the [place] of bone with is a decortication throughout at this point in cystic and replacement of the L4 screws were a 7 5 x 40 screws inserted at L4 on the right and the fluoroscopy this actually had excellent purchase on the left and a 5 x 40 screw was placed and again captured the bone  anteriorly and was felt to be solidly placed rods were then sized and placed in the top tightening nuts were then cleaned down posterior fluoroscopy confirmed good position of the screws rods and. A large irregular was placed and the wounds closed in layers with interrupted Vicryl and a running 4-0 subcuticular Vicryl. At the end of case all needle counts sponge counts were correct per the nurses.

## 2011-02-16 NOTE — Anesthesia Postprocedure Evaluation (Signed)
  Anesthesia Post-op Note  Patient: Karen Aguilar  Procedure(s) Performed: Procedure(s) (LRB): POSTERIOR LUMBAR FUSION 1 LEVEL (N/A)  Patient Location: pacu  Anesthesia Type: General  Level of Consciousness: awake, oriented, sedated and patient cooperative  Airway and Oxygen Therapy: Patient Spontanous Breathing and Patient connected to nasal cannula oxygen  Post-op Pain: moderate  Post-op Assessment: Post-op Vital signs reviewed, Patient's Cardiovascular Status Stable, Respiratory Function Stable, Patent Airway, No signs of Nausea or vomiting and Pain level controlled  Post-op Vital Signs: stable  Complications: No apparent anesthesia complications

## 2011-02-16 NOTE — Progress Notes (Signed)
Spoke with dr Randa Evens re earring bing newly pierced, she stated dpatient could remove at last moment. Instructed patient to remove prior to going in or rm, spouse to take.

## 2011-02-16 NOTE — OR Nursing (Signed)
Surgeon stated that he spoke with the patient regarding the use of her iliac bone graft for lumbar fusion.

## 2011-02-16 NOTE — H&P (Signed)
Karen Aguilar is an 46 y.o. female.   Chief Complaint: Back and right greater than left leg pain HPI: This is a 46 year old female is a very competent medical history she underwent an L4-5 two-level T. left min invasive at outside institution and accommodation where she had bone graft that migrated intrathecally and extending from L4 down to S1 she presented with continued and persistent pain workup revealed the compression and the graft within the thecal sac tissue to the or and underwent the thecal expiration of removal and debridement of as much graft material as is possible to to decompress the nerve roots. She went on to develop pseudoarthroses at L5-S1 underwent a little hardware in a left L5-S1 and repositioning or posterior hardware and L5-S1 the she subsequently also developed a pseudarthrosis at L4-5 and now presents for re- fusion L4-5. Workup has revealed a loosening hardware at L4 and incomplete fusion L4-5. Some extensor gone over the risks and benefits or reoperation posteriorly removal of hardware L4 harvesting of iliac crest bone graft replacement of hardware and redo posterior lateral fusion. I've gone overall risks and benefits of the operation including but not limited to infection nerve injury vascular injury intra-abdominal injury failure of fusion. Also explained the perioperative course and expectations of outcome alternatives surgery she understands and agrees to proceed forward.    Past Medical History  Diagnosis Date  . PONV (postoperative nausea and vomiting)   . Hypothyroidism   . Anemia     etiology unknown-iron infusion in past,last 9/12  . Fibromyalgia     DDD-s/p nerve damage after back surgery  . Headache     migraines  . Incontinence of urine     s/p sling procedure-unresolved  . DDD (degenerative disc disease), lumbar   . Neuropathy of lower extremity 2011  . GERD (gastroesophageal reflux disease)     zantac at times  . Chronic pain   . Sleep apnea    mild-doesn't use cpap    Past Surgical History  Procedure Date  . Eye surgery     lasix  . Anterior cervical discectomy 2003  . Cholecystectomy 2001  . Tonsillectomy     as a child  . Repeat cesarean section [phone]  . Pubovaginal sling 2008  . Back surgery     multiple-L4-s1,hardware,fusion,removal,infection s/p 5/11 procedure  . Shoulder closed reduction 12/18/2010    Procedure: CLOSED MANIPULATION SHOULDER;  Surgeon: Karen Aguilar;  Location: [facility];  Service: Orthopedics;;  labrial debridement    History reviewed. No pertinent family history. Social History:  reports that she has never smoked. She does not have any smokeless tobacco history on file. She reports that she drinks about 1.2 ounces of alcohol per week. Her drug history not on file.  Allergies:  Allergies  Allergen Reactions  . Adhesive (Tape)     blisters  . Thimerosal     Local reaction-red,swelling  . Azithromycin Rash  . Erythromycin Rash  . Sulfa Antibiotics Rash    Medications Prior to Admission  Medication Dose Route Frequency Provider Last Rate Last Dose  . bacitracin 54098 UNITS injection           . ceFAZolin (ANCEF) IVPB 2 g/50 mL premix  2 g Intravenous 60 min Pre-Op Karen Dollar, MD      . sodium chloride 0.9 % infusion            Medications Prior to Admission  Medication Sig Dispense Refill  . busPIRone (BUSPAR)  5 MG tablet Take 5 mg by mouth 2 (two) times daily.      Marland Kitchen escitalopram (LEXAPRO) 20 MG tablet Take 30 mg by mouth daily. 1 1/2 tabs      . ibuprofen (ADVIL,MOTRIN) 600 MG tablet Take 600 mg by mouth daily as needed. For shoulder inflammation      . levothyroxine (SYNTHROID, LEVOTHROID) 50 MCG tablet Take 50 mcg by mouth daily.        . methadone (DOLOPHINE) 10 MG tablet Take 10 mg by mouth every 6 (six) hours.       . nitrofurantoin (MACRODANTIN) 100 MG capsule Take 100 mg by mouth daily. For bladder infection      . oxymorphone (OPANA) 5 MG tablet Take 5 mg  by mouth every 6 (six) hours as needed. For breakthrough pain      . thyroid (ARMOUR) 90 MG tablet Take 90 mg by mouth daily.       Marland Kitchen topiramate (TOPAMAX) 100 MG tablet Take 100 mg by mouth daily.        . Multiple Vitamin (MULITIVITAMIN WITH MINERALS) TABS Take 1 tablet by mouth daily.      . ranitidine (ZANTAC) 150 MG capsule Take 150 mg by mouth 2 (two) times daily as needed. For indigestion        Results for orders placed during the hospital encounter of 02/16/11 (from the past 48 hour(s))  TYPE AND SCREEN     Status: Normal   Collection Time   02/16/11  6:00 AM      Component Value Range Comment   ABO/RH(D) A POS      Antibody Screen NEG      Sample Expiration 02/19/2011      No results found.  Review of Systems  Constitutional: Negative.   HENT: Positive for neck pain.   Eyes: Negative.   Respiratory: Negative.   Cardiovascular: Negative.   Gastrointestinal: Negative.   Genitourinary: Positive for urgency and frequency.  Musculoskeletal: Positive for myalgias and joint pain.  Skin: Negative.     Blood pressure 102/64, pulse 72, temperature 97.7 F (36.5 C), resp. rate 20, SpO2 98.00%. Physical Exam  Constitutional: She is oriented to person, place, and time. She appears well-developed and well-nourished.  HENT:  Head: Normocephalic.  Eyes: Pupils are equal, round, and reactive to light.  Neck: Normal range of motion.  Cardiovascular: Normal rate.   Respiratory: Breath sounds normal.  GI: Soft.  Neurological: She is alert and oriented to person, place, and time. GCS eye subscore is 4. GCS verbal subscore is 5. GCS motor subscore is 6.  Reflex Scores:      Patellar reflexes are 0 on the right side and 0 on the left side.      Achilles reflexes are 0 on the right side and 0 on the left side.      Strength is 5 out of 5 in her iliopsoas quads hamstrings 4+ over 5 dorsiflexion weakness of the right foot a baseline as well as EHL left foot is 5 out of 5.      Assessment/Plan 45 and presents for a redo posterior lateral fusion L4-5. Risks and benefits of the operation explained.  Karen Aguilar P 02/16/2011, 7:30 AM

## 2011-02-17 NOTE — Progress Notes (Signed)
PT/OT Cancellation Note:  Evaluation cancelled today due to pt. adamantly refusing to participate in therapy today. Attempted to see pt x3. Pt. States "the Doctor said I could wait until tomorrow if I want to" (for getting OOB).  Will re-attempt evaluations on 2-17. Thanks!  Lurene Robley, OTR/L Pager [phone] 02/17/2011, 2:00 PM

## 2011-02-17 NOTE — Progress Notes (Signed)
Subjective: Patient reports Patient doing well although pain is severe in her back she has no new lower Sherry signs or symptoms. Wound is clean and dry.  Objective: Vital signs in last 24 hours: Temp:  [97.8 F (36.6 C)-98.9 F (37.2 C)] 98.9 F (37.2 C) (02/16 0700) Pulse Rate:  [76-100] 89  (02/16 0700) Resp:  [12-28] 18  (02/16 0700) BP: (99-123)/(61-85) 103/65 mmHg (02/16 0700) SpO2:  [98 %-100 %] 99 % (02/16 0700) Weight:  [88.451 kg (195 lb)] 88.451 kg (195 lb) (02/15 1758)  Intake/Output from previous day: 02/15 0701 - 02/16 0700 In: 3040 [I.V.:2800] Out: 4490 [Urine:4130; Drains:85; Blood:275] Intake/Output this shift:    Baseline weakness in the right lower extremity distal strength 5 out of 5 wound is clean and dry  Lab Results: No results found for this basename: WBC:2,HGB:2,HCT:2,PLT:2 in the last 72 hours BMET No results found for this basename: NA:2,K:2,CL:2,CO2:2,GLUCOSE:2,BUN:2,CREATININE:2,CALCIUM:2 in the last 72 hours  Studies/Results: Dg Lumbar Spine 2-3 Views  02/16/2011  *RADIOLOGY REPORT*  Clinical Data: Low back pain  LUMBAR SPINE - 2-3 VIEW  Comparison: CT lumbar spine 11/02/2010  Findings: There is been removal of L4-S1 pedicle screws and rods. New pedicle screws are placed at L4 and L5.  A portion of an S1 screw has broken off in the bone on the right, but this is not a new finding.  IMPRESSION: Exploration of previous fusion with removal of hardware and re-do posterolateral L4-L5 fusion as described.  Original Report Authenticated By: Elsie Stain, M.D.    Assessment/Plan: Postop day 1 from revision of fusion or pseudoarthrosis at L4-5 progressing well mobilized slowly today to get her drain her hip.  LOS: 1 day     Bradan Congrove P 02/17/2011, 8:42 AM

## 2011-02-18 NOTE — Progress Notes (Signed)
Occupational Therapy Evaluation Patient Details Name: Karen Aguilar MRN: [id number] DOB: Aug 09, 1965 Today's Date: 02/18/2011  Problem List: There is no problem list on file for this patient.   Past Medical History:  Past Medical History  Diagnosis Date  . PONV (postoperative nausea and vomiting)   . Hypothyroidism   . Anemia     etiology unknown-iron infusion in past,last 9/12  . Fibromyalgia     DDD-s/p nerve damage after back surgery  . Headache     migraines  . Incontinence of urine     s/p sling procedure-unresolved  . DDD (degenerative disc disease), lumbar   . Neuropathy of lower extremity 2011  . GERD (gastroesophageal reflux disease)     zantac at times  . Chronic pain   . Sleep apnea     mild-doesn't use cpap   Past Surgical History:  Past Surgical History  Procedure Date  . Eye surgery     lasix  . Cholecystectomy 2001  . Pubovaginal sling 2008  . Shoulder closed reduction 12/18/2010    left; Procedure: CLOSED MANIPULATION SHOULDER;  Surgeon: Javier Docker;  Location: [facility];  Service: Orthopedics;;  labrial debridement  . Back surgery     multiple-L4-s1,hardware,fusion,removal,infection s/p 5/11 procedure  . Back surgery 02/16/11    "my 7th back OR; Procedure: Exploration of fusion removal of hardware L4-S1, harvesting of right iliac crest bone graft, redo posterior lateral fusion L4-5 using iliac crest bone graft BMP and master graft replacement of bilateral L4 screws with a Telfa tech 7.5 x 40 mm screw on the right and 8.5 x 40 mm screw on the left placement of a large Hemovac drain  . Anterior cervical decomp/discectomy fusion     C3, 4, 5; "I've had 2"  . Cesarean section 1996; 2001  . Tonsillectomy     as a child; "then they grew back"    OT Assessment/Plan/Recommendation OT Assessment Clinical Impression Statement: 46 yo s/p exploration of fusion removal of hardware L4-S1, harvesting of right iliac crest bone graft, redo  posterior lateral fusion L4-5 using iliac crest bone graft BMP and master graft replacement of B L4 screws. Pt  with hx of multiple back and cervical operations. Pt will benefit from skilled OT acute services to max indep and safety with ADL and functional mobility for ADL with nec AE and DME to facilitate safe D/C home and meet below established goals. Pt will be alone during the day. Recommend tub bench. OT Recommendation/Assessment: Patient will need skilled OT in the acute care venue Barriers to Discharge: None OT Therapy Diagnosis : Generalized weakness;Acute pain OT Plan OT Frequency: Min 2X/week OT Treatment/Interventions: Self-care/ADL training;DME and/or AE instruction;Therapeutic activities;Patient/family education OT Recommendation Follow Up Recommendations: No OT follow up Equipment Recommended: Tub/shower bench Individuals Consulted Consulted and Agree with Results and Recommendations: Patient;Family member/caregiver OT Goals Acute Rehab OT Goals OT Goal Formulation: With patient Time For Goal Achievement: 7 days ADL Goals Pt Will Perform Toileting - Hygiene: with modified independence;with adaptive equipment;Sit to stand from 3-in-1/toilet ADL Goal: Toileting - Hygiene - Progress: Goal set today Pt Will Perform Tub/Shower Transfer: with supervision;Ambulation;Transfer tub bench;Grab bars;Maintaining back safety precautions;with cueing (comment type and amount);with caregiver independent in assisting;Other (comment) (with leg lifter PRN) ADL Goal: Tub/Shower Transfer - Progress: Goal set today Additional ADL Goal #1: Pt will consistently demonstrate back precautions during ADL session utilizing nec AE and DME PRN. ADL Goal: Additional Goal #1 - Progress: Goal set today  OT  Evaluation Precautions/Restrictions  Precautions Precautions: Back Precaution Comments: pt educated on 3 back precautions Required Braces or Orthoses: Yes Spinal Brace: Lumbar corset (donn in  sitting) Restrictions Weight Bearing Restrictions: No Prior Functioning Home Living Lives With: Spouse;Daughter Receives Help From: Family Type of Home: House Home Layout: One level Home Access: Stairs to enter Entrance Stairs-Rails: None Entrance Stairs-Number of Steps: 2 Bathroom Shower/Tub: Forensic scientist: Standard Bathroom Accessibility: Yes How Accessible: Accessible via walker Home Adaptive Equipment: Straight cane;Walker - rolling;Shower chair with back;Bedside commode/3-in-1;Reacher Prior Function Level of Independence: Needs assistance with ADLs;Independent with gait;Independent with transfers Bath: Supervision/set-up Dressing: Supervision/set-up Able to Take Stairs?: Yes Driving: Yes Vocation: On disability ADL ADL Eating/Feeding: Simulated;Independent Where Assessed - Eating/Feeding: Chair Grooming: Performed;Independent Where Assessed - Grooming: Standing at sink Upper Body Bathing: Performed;Set up Where Assessed - Upper Body Bathing: Sitting, chair Lower Body Bathing: Simulated;Minimal assistance Where Assessed - Lower Body Bathing: Sitting, chair;Sit to stand from chair Upper Body Dressing: Performed;Supervision/safety (cues to not twist) Where Assessed - Upper Body Dressing: Sitting, bed;Unsupported Lower Body Dressing: Simulated;Minimal assistance (discussed use of AE and back precautions) Lower Body Dressing Details (indicate cue type and reason): requires cues to not twist Where Assessed - Lower Body Dressing: Sitting, chair;Sit to stand from chair (3 in 1) Toilet Transfer: Supervision/safety Toilet Transfer Method: Proofreader: Set designer - Clothing Manipulation: Performed;Supervision/safety Where Assessed - Glass blower/designer Manipulation: Sit to stand from 3-in-1 or toilet Toileting - Hygiene: Performed;Minimal assistance Toileting - Hygiene Details (indicate cue type and reason): cues  to not twist. demonstrated use of toilet aid to adhere to back precautions during hygiene after toileting. Where Assessed - Toileting Hygiene: Sit to stand from 3-in-1 or toilet Tub/Shower Transfer: Other (comment) (discussed need for tub bench rather than bathseat ) Equipment Used: Leg lifter;Long-handled shoe horn;Long-handled sponge;Reacher;Rolling walker Ambulation Related to ADLs: supervision RW level ADL Comments: Educated pt/husband on availability and use of AE and DME to increase indep with ADL and adhere to back precautions during ADL tasks. Also recommended to pt/husband use of tub bench rather than showerseat to decrease risk of falls and adhere to precautions. Both parties verbalized and or demonstrated understanding. Pt did require cues to adhere to the "no twisting" precaution. Vision/Perception  Vision - History Baseline Vision: No visual deficits Perception Perception: Within Functional Limits Praxis Praxis: Intact Cognition Cognition Arousal/Alertness: Awake/alert Overall Cognitive Status: Appears within functional limits for tasks assessed Orientation Level: Oriented X4 Sensation/Coordination   Extremity Assessment RUE Assessment RUE Assessment: Within Functional Limits LUE Assessment LUE Assessment: Within Functional Limits Mobility  Bed Mobility Bed Mobility: Yes Rolling Right: 5: Supervision Right Sidelying to Sit: 5: Supervision Right Sidelying to Sit Details (indicate cue type and reason): cues to maintain "log rolling" technique Sitting - Scoot to Edge of Bed: 5: Supervision Transfers Transfers: Yes Sit to Stand: 5: Supervision;With upper extremity assist;From bed;From chair/3-in-1 Stand to Sit: 5: Supervision;With upper extremity assist;To bed;To chair/3-in-1 Exercises   End of Session OT - End of Session Equipment Utilized During Treatment: Gait belt;Back brace Activity Tolerance: Patient tolerated treatment well Patient left: in bed;with call bell  in reach;with family/visitor present Nurse Communication: Other (comment) (need for tub bench) General Behavior During Session: [facility] for tasks performed Cognition: [facility] for tasks performed   [facility] 02/18/2011, 1:27 PM  [facility], OTR/L  [phone] 02/18/2011

## 2011-02-18 NOTE — Progress Notes (Signed)
Patient refused removal of foley cath, stated "I know you guys don't understand but my doctor told me to go slow and not take it out until I'm moving around better". Patient educated about increased risk for infection, and the fact that it was the doctor that ordered the foley removed. Will readdress with patient later in the morning. Casey Burkitt, RN

## 2011-02-18 NOTE — Progress Notes (Signed)
Physical Therapy Evaluation Patient Details Name: Karen Aguilar MRN: [id number] DOB: 10-28-1965 Today's Date: 02/18/2011  Problem List: There is no problem list on file for this patient.   Past Medical History:  Past Medical History  Diagnosis Date  . PONV (postoperative nausea and vomiting)   . Hypothyroidism   . Anemia     etiology unknown-iron infusion in past,last 9/12  . Fibromyalgia     DDD-s/p nerve damage after back surgery  . Headache     migraines  . Incontinence of urine     s/p sling procedure-unresolved  . DDD (degenerative disc disease), lumbar   . Neuropathy of lower extremity 2011  . GERD (gastroesophageal reflux disease)     zantac at times  . Chronic pain   . Sleep apnea     mild-doesn't use cpap   Past Surgical History:  Past Surgical History  Procedure Date  . Eye surgery     lasix  . Cholecystectomy 2001  . Pubovaginal sling 2008  . Shoulder closed reduction 12/18/2010    left; Procedure: CLOSED MANIPULATION SHOULDER;  Surgeon: Javier Docker;  Location: [facility];  Service: Orthopedics;;  labrial debridement  . Back surgery     multiple-L4-s1,hardware,fusion,removal,infection s/p 5/11 procedure  . Back surgery 02/16/11    "my 7th back OR; Procedure: Exploration of fusion removal of hardware L4-S1, harvesting of right iliac crest bone graft, redo posterior lateral fusion L4-5 using iliac crest bone graft BMP and master graft replacement of bilateral L4 screws with a Telfa tech 7.5 x 40 mm screw on the right and 8.5 x 40 mm screw on the left placement of a large Hemovac drain  . Anterior cervical decomp/discectomy fusion     C3, 4, 5; "I've had 2"  . Cesarean section 1996; 2001  . Tonsillectomy     as a child; "then they grew back"    PT Assessment/Plan/Recommendation PT Assessment Clinical Impression Statement: Pt presents with a medical diagnosis of PLIF L4-5 along with the following impairments/deficits and therapy diagnosis  listed below. Pt requires encouragement to participate in therapy, but once she is able to participate is willing and decreased complaints and pain. Pt will benefit from skilled PT in the acute care setting in order to maximize functional mobility and safety prior to d/c. PT Recommendation/Assessment: Patient will need skilled PT in the acute care venue PT Problem List: Decreased strength;Decreased activity tolerance;Decreased mobility;Decreased knowledge of use of DME;Decreased knowledge of precautions;Pain Barriers to Discharge: Decreased caregiver support Barriers to Discharge Comments: no one is home during the day PT Therapy Diagnosis : Abnormality of gait;Acute pain PT Plan PT Frequency: Min 5X/week PT Treatment/Interventions: DME instruction;Gait training;Stair training;Functional mobility training;Therapeutic activities;Therapeutic exercise;Patient/family education PT Recommendation Follow Up Recommendations: Home health PT;Supervision - Intermittent Equipment Recommended: None recommended by PT PT Goals  Acute Rehab PT Goals PT Goal Formulation: With patient Time For Goal Achievement: 7 days Pt will go Supine/Side to Sit: with modified independence PT Goal: Supine/Side to Sit - Progress: Goal set today Pt will go Sit to Stand: with modified independence PT Goal: Sit to Stand - Progress: Goal set today Pt will go Stand to Sit: with modified independence PT Goal: Stand to Sit - Progress: Goal set today Pt will Transfer Bed to Chair/Chair to Bed: with modified independence PT Transfer Goal: Bed to Chair/Chair to Bed - Progress: Goal set today Pt will Ambulate: 51 - 150 feet;with modified independence;with least restrictive assistive device PT Goal: Ambulate -  Progress: Goal set today Pt will Go Up / Down Stairs: 1-2 stairs;with supervision;with least restrictive assistive device  PT Evaluation Precautions/Restrictions  Precautions Precautions: Back Precaution Comments: pt  educated on 3 back precautions Required Braces or Orthoses: Yes Spinal Brace: Lumbar corset Restrictions Weight Bearing Restrictions: No Prior Functioning  Home Living Lives With: Spouse;Daughter Receives Help From: Family (no one available during the day) Type of Home: House Home Layout: One level Home Access: Stairs to enter Entrance Stairs-Rails: None Entrance Stairs-Number of Steps: 2 Bathroom Shower/Tub: Forensic scientist: Standard Bathroom Accessibility: Yes How Accessible: Accessible via walker Home Adaptive Equipment: Straight cane;Walker - rolling;Shower chair with back;Bedside commode/3-in-1;Reacher Prior Function Level of Independence: Needs assistance with ADLs;Independent with gait;Independent with transfers (ambulates with cane; difficulty with steps) Bath: Minimal Dressing: Minimal (intermittently) Able to Take Stairs?: Yes Driving: Yes (ocasionally; short distances) Vocation: On disability Cognition Cognition Arousal/Alertness: Awake/alert Overall Cognitive Status: Appears within functional limits for tasks assessed Orientation Level: Oriented X4 Sensation/Coordination Sensation Light Touch: Impaired Detail Light Touch Impaired Details: Impaired RLE (numb buttocks, decreased sensation R foot) Extremity Assessment RLE Assessment RLE Assessment: Exceptions to [facility] RLE Strength RLE Overall Strength: Within Functional Limits for tasks assessed;Deficits RLE Overall Strength Comments: WFL Except DF, see below Right Ankle Dorsiflexion: 3/5 LLE Assessment LLE Assessment: Within Functional Limits Mobility (including Balance) Bed Mobility Bed Mobility: Yes Rolling Right: 5: Supervision Rolling Right Details (indicate cue type and reason): VC for proper sequencing to maintain back precautions Rolling Left: 5: Supervision Rolling Left Details (indicate cue type and reason): VC for proper sequencing to maintain back precautions Right Sidelying  to Sit: 4: Min assist Right Sidelying to Sit Details (indicate cue type and reason): Assist with trunk control and tactile cues through pelvis to assist from sidelying to sit. Cues to maintain back precautions Sitting - Scoot to Edge of Bed: 5: Supervision Sitting - Scoot to Edge of Bed Details (indicate cue type and reason): VC for weight shifting and safety towards EOB Transfers Transfers: Yes Sit to Stand: 4: Min assist;With upper extremity assist;From bed Sit to Stand Details (indicate cue type and reason): VC for hand placement for safety. Min assist for forward translation. Cues to maintain back precautions without bending Stand to Sit: 4: Min assist Stand to Sit Details: VC for hand placement Ambulation/Gait Ambulation/Gait: Yes Ambulation/Gait Assistance: 4: Min assist (Minguard) Ambulation/Gait Assistance Details (indicate cue type and reason): Minguard assist for safety with ambulation. Pt with hesitancy during ambuation, although no physical assist needed for safety. Cues for safety with RW and sequencing Ambulation Distance (Feet): 150 Feet Assistive device: Rolling walker Gait Pattern: Step-to pattern;Decreased stride length;Decreased hip/knee flexion - right;Decreased hip/knee flexion - left;Decreased dorsiflexion - right Gait velocity: Decreased gait speed Stairs: No  Static Standing Balance Static Standing - Balance Support: Right upper extremity supported Static Standing - Level of Assistance: 5: Stand by assistance Static Standing - Comment/# of Minutes: SBA during pt brushing teeth for ~ 5 minutes. Pt able to maintain balance with no UE assist for short period of time Exercise    End of Session PT - End of Session Equipment Utilized During Treatment: Gait belt;Back brace Activity Tolerance: Patient tolerated treatment well Patient left: in chair;with call bell in reach;with family/visitor present Nurse Communication: Mobility status for transfers;Mobility status for  ambulation General Behavior During Session: [facility] for tasks performed Cognition: [facility] for tasks performed  Milana Kidney 02/18/2011, 10:07 AM  02/18/2011 Milana Kidney DPT PAGER: [phone] OFFICE: [phone]

## 2011-02-18 NOTE — Progress Notes (Signed)
Subjective: Patient reports That she's doing much better this morning she's a little bit of posterior right hip and thigh pain and pain into her right groin I think is consistent with her referred pain from her iliac crest  Objective: Vital signs in last 24 hours: Temp:  [98.5 F (36.9 C)-99.9 F (37.7 C)] 98.6 F (37 C) (02/17 0600) Pulse Rate:  [92-102] 92  (02/17 0600) Resp:  [16-18] 18  (02/17 0600) BP: (91-123)/(56-73) 107/71 mmHg (02/17 0600) SpO2:  [95 %-100 %] 98 % (02/17 0600)  Intake/Output from previous day: 02/16 0701 - 02/17 0700 In: 500 [P.O.:500] Out: 40 [Drains:40] Intake/Output this shift:    Strength is at baseline with some trace weakness in the right looks to be a 4+ out of 5 left looks was out of 5 wound is clean and dry scatter  Lab Results: No results found for this basename: WBC:2,HGB:2,HCT:2,PLT:2 in the last 72 hours BMET No results found for this basename: NA:2,K:2,CL:2,CO2:2,GLUCOSE:2,BUN:2,CREATININE:2,CALCIUM:2 in the last 72 hours  Studies/Results: Dg Lumbar Spine 2-3 Views  02/16/2011  *RADIOLOGY REPORT*  Clinical Data: Low back pain  LUMBAR SPINE - 2-3 VIEW  Comparison: CT lumbar spine 11/02/2010  Findings: There is been removal of L4-S1 pedicle screws and rods. New pedicle screws are placed at L4 and L5.  A portion of an S1 screw has broken off in the bone on the right, but this is not a new finding.  IMPRESSION: Exploration of previous fusion with removal of hardware and re-do posterolateral L4-L5 fusion as described.  Original Report Authenticated By: Elsie Stain, M.D.    Assessment/Plan: Progressive mobilization today, discontinue her Foley continue with the oral analgesics  LOS: 2 days     Chanelle Hodsdon P 02/18/2011, 10:51 AM

## 2011-02-19 MED ORDER — SENNOSIDES-DOCUSATE SODIUM 8.6-50 MG PO TABS
1.0000 | ORAL_TABLET | Freq: Two times a day (BID) | ORAL | Status: DC
  Administered 2011-02-19 (×2): 1 via ORAL
  Filled 2011-02-19 (×2): qty 1

## 2011-02-19 NOTE — Progress Notes (Signed)
Subjective: Patient reports She'll of the difficulty with pain last night her IV infiltrated came out and she's not able to get ahead of it with the oral pills.  Objective: Vital signs in last 24 hours: Temp:  [97.9 F (36.6 C)-98.9 F (37.2 C)] 98.2 F (36.8 C) (02/18 0455) Pulse Rate:  [94-100] 96  (02/18 0455) Resp:  [18-20] 18  (02/18 0455) BP: (102-113)/(58-68) 109/66 mmHg (02/18 0455) SpO2:  [96 %-100 %] 100 % (02/18 0455)  Intake/Output from previous day: 02/17 0701 - 02/18 0700 In: 444 [P.O.:444] Out: -  Intake/Output this shift:    Exam is stable wound clean dry  Lab Results: No results found for this basename: WBC:2,HGB:2,HCT:2,PLT:2 in the last 72 hours BMET No results found for this basename: NA:2,K:2,CL:2,CO2:2,GLUCOSE:2,BUN:2,CREATININE:2,CALCIUM:2 in the last 72 hours  Studies/Results: No results found.  Assessment/Plan: Continue work on pain management this afternoon she is voiding spontaneously now the catheter out her wound looks good we'll see if she does stem plan discharge him to get her pain under control the  LOS: 3 days     Kawthar Ennen P 02/19/2011, 7:37 AM

## 2011-02-19 NOTE — Progress Notes (Signed)
Occupational Therapy Treatment Patient Details Name: Karen Aguilar MRN: [id number] DOB: [birth year] Today's Date: 02/19/2011  OT Assessment/Plan OT Assessment/Plan OT Frequency: Min 2X/week Follow Up Recommendations: No OT follow up OT Goals Acute Rehab OT Goals Time For Goal Achievement: 7 days ADL Goals ADL Goal: Toileting - Hygiene - Progress: Progressing toward goals ADL Goal: Additional Goal #1 - Progress: Progressing toward goals  OT Treatment Precautions/Restrictions  Precautions Precautions: Back Precaution Comments: reviewed back precautions able to name 2/3 (required cueing for arching) Required Braces or Orthoses: Yes Spinal Brace: Lumbar corset;Applied in sitting position Restrictions Weight Bearing Restrictions: No   ADL ADL Grooming: Performed;Teeth care;Modified independent Where Assessed - Grooming: Standing at sink Upper Body Dressing: Performed;Supervision/safety Upper Body Dressing Details (indicate cue type and reason): don brace Where Assessed - Upper Body Dressing: Sitting, bed;Unsupported Toilet Transfer: Performed;Supervision/safety Toilet Transfer Method: Proofreader: Raised toilet seat with arms (or 3-in-1 over toilet) Toileting - Clothing Manipulation: Performed;Supervision/safety Where Assessed - Toileting Clothing Manipulation: Sit to stand from 3-in-1 or toilet Toileting - Hygiene: Performed;Supervision/safety Where Assessed - Toileting Hygiene: Sit to stand from 3-in-1 or toilet Tub/Shower Transfer Method: Other (comment) (states she will discuss bench vs. chair with her husband) Equipment Used: Rolling walker Ambulation Related to ADLs: s with r.w. in room Mobility  Bed Mobility Bed Mobility: Yes Rolling Right: 5: Supervision Rolling Left: 5: Supervision Right Sidelying to Sit: 5: Supervision Sitting - Scoot to Edge of Bed: 5: Supervision Transfers Transfers: Yes Sit to Stand: 4: Min assist;With upper extremity  assist;From bed;From chair/3-in-1;With armrests Stand to Sit: 5: Supervision;Without upper extremity assist;To chair/3-in-1;With armrests Exercises    End of Session OT - End of Session Equipment Utilized During Treatment: Back brace Activity Tolerance: Patient tolerated treatment well Patient left: in bed;with call bell in reach General Behavior During Session: [facility] for tasks performed Cognition: [facility] for tasks performed  Robet Leu COTA/L  02/19/2011, 12:40 PM

## 2011-02-19 NOTE — Progress Notes (Signed)
Utilization review completed. Delaynee Alred, RN, BSN.  02/19/11  

## 2011-02-19 NOTE — Progress Notes (Signed)
Physical Therapy Treatment Patient Details Name: MARRISA KIMBER MRN: [id number] DOB: 11/29/1965 Today's Date: 02/19/2011  PT Assessment/Plan  PT - Assessment/Plan Comments on Treatment Session: 46 yo s/p exploration of fusion removal of hardware L4-S1, harvesting of right iliac crest bone graft, redo posterior lateral fusion L4-5 using iliac crest bone graft BMP and master graft replacement of B L4 screws. Pt  with hx of multiple back and cervical operations. Pt currently having most difficulty with steps, requires up to moderate assist to perform. Pt requires rails currently however does not have rails at home. Pt ambulating nicely however continues to require RW.  PT Plan: Discharge plan remains appropriate PT Frequency: Min 5X/week Follow Up Recommendations: Home health PT;Supervision - Intermittent Equipment Recommended: None recommended by PT PT Goals  Acute Rehab PT Goals Pt will go Supine/Side to Sit: with modified independence PT Goal: Supine/Side to Sit - Progress: Progressing toward goal Pt will go Sit to Stand: with modified independence PT Goal: Sit to Stand - Progress: Progressing toward goal Pt will go Stand to Sit: with modified independence PT Goal: Stand to Sit - Progress: Progressing toward goal Pt will Transfer Bed to Chair/Chair to Bed: with modified independence PT Transfer Goal: Bed to Chair/Chair to Bed - Progress: Progressing toward goal Pt will Ambulate: 51 - 150 feet;with modified independence;with least restrictive assistive device PT Goal: Ambulate - Progress: Progressing toward goal Pt will Go Up / Down Stairs: 1-2 stairs;with supervision;with least restrictive assistive device PT Goal: Up/Down Stairs - Progress: Progressing toward goal  PT Treatment Precautions/Restrictions  Precautions Precautions: Back Precaution Comments: reviewed back precautions able to name 2/3 (required cueing for arching) Required Braces or Orthoses: Yes Spinal Brace: Lumbar  corset;Applied in sitting position Restrictions Weight Bearing Restrictions: No Mobility (including Balance) Bed Mobility Bed Mobility: Yes Rolling Right: 5: Supervision Rolling Right Details (indicate cue type and reason): Verbal cues for back precautions.  Right Sidelying to Sit: 5: Supervision Right Sidelying to Sit Details (indicate cue type and reason): Verbal cues to minimize "twisting" to adhere to back precautions.  Transfers Transfers: Yes Sit to Stand: With upper extremity assist;With armrests;From toilet;From bed;5: Supervision Sit to Stand Details (indicate cue type and reason): Supervision from bed and toilet. Pt requires rail in bathroom standing from toilet.  Stand to Sit: 5: Supervision;Without upper extremity assist;To bed Stand to Sit Details: Verbal cues for hand placement, pt not following keeping hands on RW.  Ambulation/Gait Ambulation/Gait: Yes Ambulation/Gait Assistance: Other (comment) (min-guard assist) Ambulation/Gait Assistance Details (indicate cue type and reason): Verbal cues for posture and distancing of RW. Min-guard for safety. Stepto pattern progressing to step-through pattern.  Ambulation Distance (Feet): 250 Feet Assistive device: Rolling walker Gait Pattern: Step-to pattern;Step-through pattern;Trunk flexed;Decreased dorsiflexion - left Stairs: Yes Stairs Assistance: 4: Min assist;3: Mod assist Stairs Assistance Details (indicate cue type and reason): min assist first attempt, moderate assist second attempt on descent. Verbal cues for sequencing and safety. Pt requires rails currently however does not have rails at home. Verbal cues to maintain back precautions throughout.  Stair Management Technique: One rail Right;Forwards;Step to pattern;With cane Number of Stairs: 4     Exercise  Other Exercises Other Exercises: Pt declining secondary to pain.  End of Session PT - End of Session Equipment Utilized During Treatment: Gait belt;Back  brace Activity Tolerance: Patient tolerated treatment well Patient left: with call bell in reach;in bed (sitting EOB) Nurse Communication: Mobility status for transfers;Mobility status for ambulation General Behavior During Session: [facility] for tasks  performed Cognition: [facility] for tasks performed  Sherie Don 02/19/2011, 4:47 PM  Dahlia Client (Beverely Pace) Carleene Mains PT, DPT Acute Rehabilitation [phone]

## 2011-02-20 MED ORDER — HYDROMORPHONE HCL 2 MG PO TABS: 4.0000 mg | ORAL_TABLET | ORAL | Status: AC | PRN

## 2011-02-20 NOTE — Progress Notes (Signed)
CARE MANAGEMENT NOTE 02/20/2011  Action/Plan:   Discharge planning.   Anticipated DC Date:  02/20/2011   Anticipated DC Plan:  HOME/SELF CARE      DC Planning Services  CM consult      PAC Choice  DURABLE MEDICAL EQUIPMENT   Choice offered to / List presented to:     DME arranged  [employer]      DME agency  [facility]     HH arranged  NA      HH agency  NA   Status of service:  Completed, signed off

## 2011-02-20 NOTE — Discharge Instructions (Signed)
No lifting no bending no twisting no driving a riding a car with his come back in 4 to see her doctor

## 2011-02-20 NOTE — Progress Notes (Signed)
Subjective: Patient reports She's doing better this morning she's ready for discharge  Objective: Vital signs in last 24 hours: Temp:  [97.8 F (36.6 C)-99.2 F (37.3 C)] 98.6 F (37 C) (02/19 0600) Pulse Rate:  [88-104] 95  (02/19 0600) Resp:  [17-19] 18  (02/19 0600) BP: (84-105)/(53-71) 92/57 mmHg (02/19 0600) SpO2:  [96 %-100 %] 96 % (02/19 0600)  Intake/Output from previous day: 02/18 0701 - 02/19 0700 In: 598 [P.O.:598] Out: -  Intake/Output this shift:    Strength is 5 out of 5 in the left lower extremity right looks to be baseline weakness 4+ out of 5  Lab Results: No results found for this basename: WBC:2,HGB:2,HCT:2,PLT:2 in the last 72 hours BMET No results found for this basename: NA:2,K:2,CL:2,CO2:2,GLUCOSE:2,BUN:2,CREATININE:2,CALCIUM:2 in the last 72 hours  Studies/Results: No results found.  Assessment/Plan: Discharged home  LOS: 4 days     Maizy Davanzo P 02/20/2011, 8:11 AM

## 2011-02-20 NOTE — Progress Notes (Signed)
Physical Therapy Treatment Patient Details Name: Karen Aguilar MRN: [id number] DOB: 04/24/1965 Today's Date: 02/20/2011  PT Assessment/Plan  PT - Assessment/Plan Comments on Treatment Session: 46 yo s/p exploration of fusion removal of hardware L4-S1, harvesting of right iliac crest bone graft, redo posterior lateral fusion L4-5 using iliac crest bone graft BMP and master graft replacement of B L4 screws. Pt declining long distance ambulation or stairs today secondary to leaving - desiring to minimize back pain prior to return home. Educated pt on safety with donning/doffing clothing. Discussed DME. Pt requesting tub bench as she frequently trips over her tub. PT agrees that a tub bench would decrease her risk for falls once at home. RN made aware of requested DME PT Plan: Discharge plan remains appropriate PT Frequency: Min 5X/week Follow Up Recommendations: Home health PT;Supervision - Intermittent Equipment Recommended: Tub/shower bench PT Goals  Acute Rehab PT Goals Pt will go Supine/Side to Sit: with modified independence PT Goal: Supine/Side to Sit - Progress: Met Pt will go Sit to Stand: with modified independence PT Goal: Sit to Stand - Progress: Progressing toward goal Pt will go Stand to Sit: with modified independence PT Goal: Stand to Sit - Progress: Progressing toward goal Pt will Transfer Bed to Chair/Chair to Bed: with modified independence PT Transfer Goal: Bed to Chair/Chair to Bed - Progress: Progressing toward goal Pt will Ambulate: 51 - 150 feet;with modified independence;with least restrictive assistive device PT Goal: Ambulate - Progress: Progressing toward goal Pt will Go Up / Down Stairs: 1-2 stairs;with supervision;with least restrictive assistive device PT Goal: Up/Down Stairs - Progress: Progressing toward goal  PT Treatment Precautions/Restrictions  Precautions Precautions: Back Precaution Comments: Pt recalling 3/3 back precautions - demonstrated adherence  throughout session Required Braces or Orthoses: Yes Spinal Brace: Lumbar corset;Applied in sitting position Restrictions Weight Bearing Restrictions: No Mobility (including Balance) Bed Mobility Bed Mobility: Yes Rolling Right: 6: Modified independent (Device/Increase time) Right Sidelying to Sit: 6: Modified independent (Device/Increase time) Transfers Transfers: Yes Sit to Stand: With upper extremity assist;From toilet;From bed;5: Supervision;With armrests Sit to Stand Details (indicate cue type and reason): Supervision for safety Stand to Sit: 5: Supervision;Without upper extremity assist;To bed Stand to Sit Details: Supervision for safety, verbal cues for UE placement Ambulation/Gait Ambulation/Gait: Yes Ambulation/Gait Assistance: 5: Supervision Ambulation/Gait Assistance Details (indicate cue type and reason): Limited ambulation as pt not wanting to flare up back pain prior to going home. No loss of balance noted.  Ambulation Distance (Feet): 30 Feet Assistive device: Rolling walker Gait Pattern: Step-through pattern;Decreased dorsiflexion - left Gait velocity: Slow cadence Stairs: No (pt declining as she is soon to return home. )  Posture/Postural Control Posture/Postural Control: No significant limitations Dynamic Sitting Balance Dynamic Sitting - Balance Support: No upper extremity supported;Feet supported Dynamic Sitting - Level of Assistance: 5: Stand by assistance Dynamic Sitting - Comments: Pt performing donning of clothes to go home in sitting. Verbal cues throughout to maintain back precautions.  End of Session PT - End of Session Equipment Utilized During Treatment: Gait belt;Back brace Activity Tolerance: Patient tolerated treatment well Patient left: with call bell in reach;in bed (sitting EOB) General Behavior During Session: [facility] for tasks performed Cognition: [facility] for tasks performed  Sherie Don 02/20/2011, 3:08 PM  Sherie Don) Carleene Mains PT,  DPT Acute Rehabilitation [phone]

## 2011-02-20 NOTE — Progress Notes (Signed)
OT Cancellation Note   Treatment cancelled today due to pt discharging from hospital upon OT arrival.    02/20/2011 Cipriano Mile OTR/L Pager [phone] Office [phone]

## 2011-02-20 NOTE — Discharge Summary (Signed)
  Physician Discharge Summary  Patient ID: Karen Aguilar MRN: [id number] DOB/AGE: [birth year] 46 y.o.  Admit date: 02/16/2011 Discharge date: 02/20/2011  Admission Diagnoses: Pseudoarthrosis L4-5  Discharge Diagnoses: Same Active Problems:  * No active hospital problems. *    Discharged Condition: good  Hospital Course: Patient was admitted to the hospital underwent a revision fusion L4-5 replacement loose hardware harvesting of iliac crest bone and refusion L4-5 the postoperatively patient did very well with recovered in the floor on the floor patient convalesced well and was voiding spontaneously the time of discharge. With the patient be discharged home scheduled followup approximately one week.  Consults: Significant Diagnostic Studies: Treatments: L4-5 revision fusion Discharge Exam: Blood pressure 92/57, pulse 95, temperature 98.6 F (37 C), temperature source Oral, resp. rate 18, height 5\' 7"  (1.702 m), weight 88.451 kg (195 lb), last menstrual period 01/16/2011, SpO2 96.00%. Strength 5 out of 5 neurologically baseline  Disposition: Home   Medication List  As of 02/20/2011  8:13 AM   TAKE these medications         busPIRone 5 MG tablet   Commonly known as: BUSPAR   Take 5 mg by mouth 2 (two) times daily.      escitalopram 20 MG tablet   Commonly known as: LEXAPRO   Take 30 mg by mouth daily. 1 1/2 tabs      HYDROmorphone 2 MG tablet   Commonly known as: DILAUDID   Take 2 tablets (4 mg total) by mouth every 4 (four) hours as needed for pain.      ibuprofen 600 MG tablet   Commonly known as: ADVIL,MOTRIN   Take 600 mg by mouth daily as needed. For shoulder inflammation      levothyroxine 50 MCG tablet   Commonly known as: SYNTHROID, LEVOTHROID   Take 50 mcg by mouth daily.      methadone 10 MG tablet   Commonly known as: DOLOPHINE   Take 10 mg by mouth every 6 (six) hours.      mulitivitamin with minerals Tabs   Take 1 tablet by mouth daily.     nitrofurantoin 100 MG capsule   Commonly known as: MACRODANTIN   Take 100 mg by mouth daily. For bladder infection      oxymorphone 5 MG tablet   Commonly known as: OPANA   Take 5 mg by mouth every 6 (six) hours as needed. For breakthrough pain      ranitidine 150 MG capsule   Commonly known as: ZANTAC   Take 150 mg by mouth 2 (two) times daily as needed. For indigestion      thyroid 90 MG tablet   Commonly known as: ARMOUR   Take 90 mg by mouth daily.      topiramate 100 MG tablet   Commonly known as: TOPAMAX   Take 100 mg by mouth daily.           Follow-up Information    Follow up in 1 week.         Signed: Jaxx Huish P 02/20/2011, 8:13 AM

## 2011-03-15 ENCOUNTER — Encounter (HOSPITAL_COMMUNITY)
Admission: RE | Disposition: A | Discharge status: Home / Self Care | Payer: Self-pay | Source: Ambulatory Visit | Attending: Neurosurgery

## 2011-03-15 ENCOUNTER — Ambulatory Visit (HOSPITAL_COMMUNITY): Payer: BC Managed Care – PPO | Admitting: Certified Registered"

## 2011-03-15 ENCOUNTER — Encounter (HOSPITAL_COMMUNITY): Payer: Self-pay | Admitting: Certified Registered"

## 2011-03-15 ENCOUNTER — Encounter (HOSPITAL_COMMUNITY): Payer: Self-pay | Admitting: *Deleted

## 2011-03-15 ENCOUNTER — Ambulatory Visit: Payer: Self-pay | Admitting: Neurosurgery

## 2011-03-15 ENCOUNTER — Ambulatory Visit (HOSPITAL_COMMUNITY)
Admission: RE | Admit: 2011-03-15 | Discharge: 2011-03-16 | Disposition: A | Discharge status: Home / Self Care | Payer: BC Managed Care – PPO | Source: Ambulatory Visit | Attending: Neurosurgery | Admitting: Neurosurgery

## 2011-03-15 DIAGNOSIS — G8929 Other chronic pain: Secondary | ICD-10-CM | POA: Insufficient documentation

## 2011-03-15 DIAGNOSIS — K219 Gastro-esophageal reflux disease without esophagitis: Secondary | ICD-10-CM | POA: Insufficient documentation

## 2011-03-15 DIAGNOSIS — M5137 Other intervertebral disc degeneration, lumbosacral region: Secondary | ICD-10-CM | POA: Insufficient documentation

## 2011-03-15 DIAGNOSIS — Y849 Medical procedure, unspecified as the cause of abnormal reaction of the patient, or of later complication, without mention of misadventure at the time of the procedure: Secondary | ICD-10-CM | POA: Insufficient documentation

## 2011-03-15 DIAGNOSIS — R51 Headache: Secondary | ICD-10-CM | POA: Insufficient documentation

## 2011-03-15 DIAGNOSIS — IMO0002 Reserved for concepts with insufficient information to code with codable children: Secondary | ICD-10-CM | POA: Insufficient documentation

## 2011-03-15 DIAGNOSIS — T8149XA Infection following a procedure, other surgical site, initial encounter: Secondary | ICD-10-CM

## 2011-03-15 DIAGNOSIS — M51379 Other intervertebral disc degeneration, lumbosacral region without mention of lumbar back pain or lower extremity pain: Secondary | ICD-10-CM | POA: Insufficient documentation

## 2011-03-15 DIAGNOSIS — G4730 Sleep apnea, unspecified: Secondary | ICD-10-CM | POA: Insufficient documentation

## 2011-03-15 HISTORY — PX: LUMBAR WOUND DEBRIDEMENT: SHX1988

## 2011-03-15 LAB — COMPREHENSIVE METABOLIC PANEL
Albumin: 3.4 g/dL — ABNORMAL LOW (ref 3.5–5.2)
BUN: 11 mg/dL (ref 6–23)
Creatinine, Ser: 0.7 mg/dL (ref 0.50–1.10)
Potassium: 4.2 mEq/L (ref 3.5–5.1)
Total Protein: 6.9 g/dL (ref 6.0–8.3)

## 2011-03-15 LAB — GRAM STAIN

## 2011-03-15 LAB — CBC
MCV: 86.7 fL (ref 78.0–100.0)
Platelets: 298 10*3/uL (ref 150–400)
RDW: 12.7 % (ref 11.5–15.5)
WBC: 6.5 10*3/uL (ref 4.0–10.5)

## 2011-03-15 LAB — SURGICAL PCR SCREEN: MRSA, PCR: NEGATIVE

## 2011-03-15 SURGERY — LUMBAR WOUND DEBRIDEMENT
Anesthesia: General | Site: Back | Wound class: Clean

## 2011-03-15 MED ORDER — MENTHOL 3 MG MT LOZG: 1.0000 | LOZENGE | OROMUCOSAL | Status: DC | PRN

## 2011-03-15 MED ORDER — DEXAMETHASONE SODIUM PHOSPHATE 10 MG/ML IJ SOLN
INTRAMUSCULAR | Status: DC | PRN
  Administered 2011-03-15: 10 mg via INTRAVENOUS

## 2011-03-15 MED ORDER — BUSPIRONE HCL 5 MG PO TABS
5.0000 mg | ORAL_TABLET | Freq: Two times a day (BID) | ORAL | Status: DC
Start: 2011-03-15 — End: 2011-03-16
  Administered 2011-03-15 – 2011-03-16 (×2): 5 mg via ORAL
  Filled 2011-03-15 (×3): qty 1

## 2011-03-15 MED ORDER — FAMOTIDINE 10 MG PO TABS
10.0000 mg | ORAL_TABLET | Freq: Every day | ORAL | Status: DC
  Filled 2011-03-15: qty 1

## 2011-03-15 MED ORDER — ROCURONIUM BROMIDE 100 MG/10ML IV SOLN
INTRAVENOUS | Status: DC | PRN
  Administered 2011-03-15: 50 mg via INTRAVENOUS

## 2011-03-15 MED ORDER — THYROID 30 MG PO TABS
90.0000 mg | ORAL_TABLET | Freq: Every day | ORAL | Status: DC
  Administered 2011-03-16: 90 mg via ORAL
  Filled 2011-03-15: qty 1

## 2011-03-15 MED ORDER — CEPHALEXIN 500 MG PO CAPS
500.0000 mg | ORAL_CAPSULE | Freq: Four times a day (QID) | ORAL | Status: DC
  Administered 2011-03-16: 500 mg via ORAL
  Filled 2011-03-15 (×4): qty 1

## 2011-03-15 MED ORDER — PROPOFOL 10 MG/ML IV EMUL
INTRAVENOUS | Status: DC | PRN
  Administered 2011-03-15: 150 mg via INTRAVENOUS

## 2011-03-15 MED ORDER — SODIUM CHLORIDE 0.9 % IV SOLN
INTRAVENOUS | Status: AC
  Filled 2011-03-15: qty 500

## 2011-03-15 MED ORDER — MORPHINE SULFATE 4 MG/ML IJ SOLN
INTRAMUSCULAR | Status: AC
  Filled 2011-03-15: qty 1

## 2011-03-15 MED ORDER — 0.9 % SODIUM CHLORIDE (POUR BTL) OPTIME
TOPICAL | Status: DC | PRN
  Administered 2011-03-15: 1000 mL

## 2011-03-15 MED ORDER — LIDOCAINE HCL 4 % MT SOLN
OROMUCOSAL | Status: DC | PRN
  Administered 2011-03-15: 4 mL via TOPICAL

## 2011-03-15 MED ORDER — ONDANSETRON HCL 4 MG/2ML IJ SOLN
INTRAMUSCULAR | Status: DC | PRN
  Administered 2011-03-15 (×2): 4 mg via INTRAVENOUS

## 2011-03-15 MED ORDER — ACETAMINOPHEN 325 MG PO TABS: 650.0000 mg | ORAL_TABLET | ORAL | Status: DC | PRN

## 2011-03-15 MED ORDER — TOPIRAMATE 100 MG PO TABS
100.0000 mg | ORAL_TABLET | Freq: Every day | ORAL | Status: DC
  Administered 2011-03-16: 100 mg via ORAL
  Filled 2011-03-15: qty 1

## 2011-03-15 MED ORDER — SODIUM CHLORIDE 0.9 % IJ SOLN: 3.0000 mL | Freq: Two times a day (BID) | INTRAMUSCULAR | Status: DC

## 2011-03-15 MED ORDER — BACITRACIN 50000 UNITS IM SOLR
INTRAMUSCULAR | Status: AC
  Filled 2011-03-15: qty 3

## 2011-03-15 MED ORDER — MUPIROCIN 2 % EX OINT
TOPICAL_OINTMENT | CUTANEOUS | Status: AC
  Administered 2011-03-15: 15:00:00 via NASAL
  Filled 2011-03-15: qty 22

## 2011-03-15 MED ORDER — ACETAMINOPHEN 650 MG RE SUPP: 650.0000 mg | RECTAL | Status: DC | PRN

## 2011-03-15 MED ORDER — ALUM & MAG HYDROXIDE-SIMETH 200-200-20 MG/5ML PO SUSP: 30.0000 mL | Freq: Four times a day (QID) | ORAL | Status: DC | PRN

## 2011-03-15 MED ORDER — ADULT MULTIVITAMIN W/MINERALS CH
1.0000 | ORAL_TABLET | Freq: Every day | ORAL | Status: DC
  Administered 2011-03-16: 1 via ORAL
  Filled 2011-03-15: qty 1

## 2011-03-15 MED ORDER — FENTANYL CITRATE 0.05 MG/ML IJ SOLN
INTRAMUSCULAR | Status: DC | PRN
  Administered 2011-03-15: 50 ug via INTRAVENOUS
  Administered 2011-03-15: 100 ug via INTRAVENOUS
  Administered 2011-03-15: 50 ug via INTRAVENOUS

## 2011-03-15 MED ORDER — LACTATED RINGERS IV SOLN: INTRAVENOUS | Status: DC

## 2011-03-15 MED ORDER — GLYCOPYRROLATE 0.2 MG/ML IJ SOLN
INTRAMUSCULAR | Status: DC | PRN
  Administered 2011-03-15: .8 mg via INTRAVENOUS

## 2011-03-15 MED ORDER — CEFAZOLIN SODIUM 1-5 GM-% IV SOLN
INTRAVENOUS | Status: DC | PRN
  Administered 2011-03-15: 1 g via INTRAVENOUS

## 2011-03-15 MED ORDER — MIDAZOLAM HCL 5 MG/5ML IJ SOLN
INTRAMUSCULAR | Status: DC | PRN
  Administered 2011-03-15: 2 mg via INTRAVENOUS

## 2011-03-15 MED ORDER — MORPHINE SULFATE 4 MG/ML IJ SOLN
1.0000 mg | INTRAMUSCULAR | Status: DC | PRN
  Administered 2011-03-15 (×2): 2 mg via INTRAVENOUS

## 2011-03-15 MED ORDER — CEFAZOLIN SODIUM 1-5 GM-% IV SOLN
1.0000 g | Freq: Three times a day (TID) | INTRAVENOUS | Status: DC
  Filled 2011-03-15 (×2): qty 50

## 2011-03-15 MED ORDER — CEFAZOLIN SODIUM 1-5 GM-% IV SOLN
INTRAVENOUS | Status: AC
  Filled 2011-03-15: qty 50

## 2011-03-15 MED ORDER — NEOSTIGMINE METHYLSULFATE 1 MG/ML IJ SOLN
INTRAMUSCULAR | Status: DC | PRN
  Administered 2011-03-15: 5 mg via INTRAVENOUS

## 2011-03-15 MED ORDER — LACTATED RINGERS IV SOLN
INTRAVENOUS | Status: DC | PRN
  Administered 2011-03-15 (×2): via INTRAVENOUS

## 2011-03-15 MED ORDER — SODIUM CHLORIDE 0.9 % IR SOLN
Status: DC | PRN
  Administered 2011-03-15 (×2)

## 2011-03-15 MED ORDER — MORPHINE SULFATE 15 MG PO TABS
15.0000 mg | ORAL_TABLET | ORAL | Status: DC | PRN
  Administered 2011-03-15 – 2011-03-16 (×4): 15 mg via ORAL
  Filled 2011-03-15 (×4): qty 1

## 2011-03-15 MED ORDER — METHADONE HCL 10 MG PO TABS
10.0000 mg | ORAL_TABLET | Freq: Four times a day (QID) | ORAL | Status: DC
  Administered 2011-03-15 – 2011-03-16 (×3): 10 mg via ORAL
  Filled 2011-03-15 (×3): qty 1

## 2011-03-15 MED ORDER — ONDANSETRON HCL 4 MG/2ML IJ SOLN: 4.0000 mg | INTRAMUSCULAR | Status: DC | PRN

## 2011-03-15 MED ORDER — CEPHALEXIN 500 MG PO CAPS
500.0000 mg | ORAL_CAPSULE | Freq: Four times a day (QID) | ORAL | Status: DC
  Filled 2011-03-15: qty 1

## 2011-03-15 MED ORDER — SCOPOLAMINE 1 MG/3DAYS TD PT72
MEDICATED_PATCH | TRANSDERMAL | Status: DC | PRN
  Administered 2011-03-15: 1 via TRANSDERMAL

## 2011-03-15 MED ORDER — MUPIROCIN 2 % EX OINT
TOPICAL_OINTMENT | Freq: Two times a day (BID) | CUTANEOUS | Status: DC
  Administered 2011-03-15: 23:00:00 via NASAL
  Filled 2011-03-15: qty 22

## 2011-03-15 MED ORDER — PHENOL 1.4 % MT LIQD: 1.0000 | OROMUCOSAL | Status: DC | PRN

## 2011-03-15 MED ORDER — MORPHINE SULFATE 10 MG/ML IJ SOLN: 0.0500 mg/kg | INTRAMUSCULAR | Status: DC | PRN

## 2011-03-15 MED ORDER — HYDROMORPHONE HCL PF 1 MG/ML IJ SOLN: 0.2500 mg | INTRAMUSCULAR | Status: DC | PRN

## 2011-03-15 MED ORDER — CYCLOBENZAPRINE HCL 10 MG PO TABS
10.0000 mg | ORAL_TABLET | Freq: Three times a day (TID) | ORAL | Status: DC | PRN
  Administered 2011-03-16: 10 mg via ORAL
  Filled 2011-03-15: qty 1

## 2011-03-15 MED ORDER — SODIUM CHLORIDE 0.9 % IV SOLN
INTRAVENOUS | Status: AC
  Filled 2011-03-15: qty 1500

## 2011-03-15 MED ORDER — BACITRACIN 50000 UNITS IM SOLR
INTRAMUSCULAR | Status: AC
  Filled 2011-03-15: qty 1

## 2011-03-15 MED ORDER — ESCITALOPRAM OXALATE 20 MG PO TABS
30.0000 mg | ORAL_TABLET | Freq: Every day | ORAL | Status: DC
  Administered 2011-03-16: 30 mg via ORAL
  Filled 2011-03-15: qty 1

## 2011-03-15 MED ORDER — LEVOTHYROXINE SODIUM 50 MCG PO TABS
50.0000 ug | ORAL_TABLET | Freq: Every day | ORAL | Status: DC
  Administered 2011-03-16: 50 ug via ORAL
  Filled 2011-03-15 (×2): qty 1

## 2011-03-15 MED ORDER — BACITRACIN ZINC 500 UNIT/GM EX OINT
TOPICAL_OINTMENT | CUTANEOUS | Status: DC | PRN
  Administered 2011-03-15: 1 via TOPICAL

## 2011-03-15 SURGICAL SUPPLY — 70 items
BAG DECANTER FOR FLEXI CONT (MISCELLANEOUS) ×4 IMPLANT
BENZOIN TINCTURE PRP APPL 2/3 (GAUZE/BANDAGES/DRESSINGS) ×2 IMPLANT
BLADE SURG ROTATE 9660 (MISCELLANEOUS) IMPLANT
BRUSH SCRUB EZ PLAIN DRY (MISCELLANEOUS) ×2 IMPLANT
CANISTER SUCTION 2500CC (MISCELLANEOUS) ×2 IMPLANT
CLOTH BEACON ORANGE TIMEOUT ST (SAFETY) ×2 IMPLANT
CONT SPEC 4OZ CLIKSEAL STRL BL (MISCELLANEOUS) ×4 IMPLANT
CORDS BIPOLAR (ELECTRODE) ×2 IMPLANT
DECANTER SPIKE VIAL GLASS SM (MISCELLANEOUS) IMPLANT
DERMABOND ADVANCED (GAUZE/BANDAGES/DRESSINGS) ×1
DERMABOND ADVANCED .7 DNX12 (GAUZE/BANDAGES/DRESSINGS) ×1 IMPLANT
DRAIN CHANNEL 10M FLAT 3/4 FLT (DRAIN) ×2 IMPLANT
DRAPE LAPAROTOMY 100X72X124 (DRAPES) ×2 IMPLANT
DRAPE POUCH INSTRU U-SHP 10X18 (DRAPES) ×2 IMPLANT
DRAPE SURG 17X23 STRL (DRAPES) ×4 IMPLANT
DRSG OPSITE 4X5.5 SM (GAUZE/BANDAGES/DRESSINGS) ×2 IMPLANT
ELECT REM PT RETURN 9FT ADLT (ELECTROSURGICAL) ×2
ELECTRODE REM PT RTRN 9FT ADLT (ELECTROSURGICAL) ×1 IMPLANT
EVACUATOR 1/8 PVC DRAIN (DRAIN) IMPLANT
EVACUATOR SILICONE 100CC (DRAIN) ×2 IMPLANT
GAUZE SPONGE 4X4 16PLY XRAY LF (GAUZE/BANDAGES/DRESSINGS) IMPLANT
GLOVE BIO SURGEON STRL SZ 6.5 (GLOVE) ×4 IMPLANT
GLOVE BIO SURGEON STRL SZ7 (GLOVE) IMPLANT
GLOVE BIO SURGEON STRL SZ7.5 (GLOVE) IMPLANT
GLOVE BIO SURGEON STRL SZ8 (GLOVE) ×2 IMPLANT
GLOVE BIO SURGEON STRL SZ8.5 (GLOVE) IMPLANT
GLOVE BIOGEL M 8.0 STRL (GLOVE) IMPLANT
GLOVE BIOGEL PI IND STRL 6.5 (GLOVE) ×2 IMPLANT
GLOVE BIOGEL PI INDICATOR 6.5 (GLOVE) ×2
GLOVE ECLIPSE 6.5 STRL STRAW (GLOVE) IMPLANT
GLOVE ECLIPSE 7.0 STRL STRAW (GLOVE) IMPLANT
GLOVE ECLIPSE 7.5 STRL STRAW (GLOVE) IMPLANT
GLOVE ECLIPSE 8.0 STRL XLNG CF (GLOVE) IMPLANT
GLOVE ECLIPSE 8.5 STRL (GLOVE) IMPLANT
GLOVE EXAM NITRILE LRG STRL (GLOVE) IMPLANT
GLOVE EXAM NITRILE MD LF STRL (GLOVE) IMPLANT
GLOVE EXAM NITRILE XL STR (GLOVE) IMPLANT
GLOVE EXAM NITRILE XS STR PU (GLOVE) IMPLANT
GLOVE INDICATOR 6.5 STRL GRN (GLOVE) IMPLANT
GLOVE INDICATOR 7.0 STRL GRN (GLOVE) IMPLANT
GLOVE INDICATOR 7.5 STRL GRN (GLOVE) IMPLANT
GLOVE INDICATOR 8.0 STRL GRN (GLOVE) IMPLANT
GLOVE INDICATOR 8.5 STRL (GLOVE) ×2 IMPLANT
GLOVE OPTIFIT SS 8.0 STRL (GLOVE) IMPLANT
GLOVE SURG SS PI 6.5 STRL IVOR (GLOVE) ×2 IMPLANT
GOWN BRE IMP SLV AUR LG STRL (GOWN DISPOSABLE) ×2 IMPLANT
GOWN BRE IMP SLV AUR XL STRL (GOWN DISPOSABLE) ×2 IMPLANT
GOWN STRL REIN 2XL LVL4 (GOWN DISPOSABLE) ×2 IMPLANT
KIT BASIN OR (CUSTOM PROCEDURE TRAY) ×2 IMPLANT
KIT ROOM TURNOVER OR (KITS) ×2 IMPLANT
NEEDLE HYPO 25X1 1.5 SAFETY (NEEDLE) IMPLANT
NS IRRIG 1000ML POUR BTL (IV SOLUTION) ×2 IMPLANT
PACK LAMINECTOMY NEURO (CUSTOM PROCEDURE TRAY) ×2 IMPLANT
PATTIES SURGICAL .75X.75 (GAUZE/BANDAGES/DRESSINGS) IMPLANT
SPONGE GAUZE 4X4 12PLY (GAUZE/BANDAGES/DRESSINGS) ×2 IMPLANT
SPONGE SURGIFOAM ABS GEL SZ50 (HEMOSTASIS) ×2 IMPLANT
STRIP CLOSURE SKIN 1/2X4 (GAUZE/BANDAGES/DRESSINGS) ×2 IMPLANT
SUT ETHILON 3 0 FSL (SUTURE) ×4 IMPLANT
SUT VIC AB 0 CT1 18XCR BRD8 (SUTURE) ×1 IMPLANT
SUT VIC AB 0 CT1 8-18 (SUTURE) ×1
SUT VIC AB 2-0 CT1 18 (SUTURE) ×2 IMPLANT
SUT VICRYL 4-0 PS2 18IN ABS (SUTURE) ×2 IMPLANT
SWAB CULTURE LIQ STUART DBL (MISCELLANEOUS) ×2 IMPLANT
SYR 20ML ECCENTRIC (SYRINGE) ×2 IMPLANT
SYR CONTROL 10ML LL (SYRINGE) IMPLANT
TAPE PAPER 2X10 WHT MICROPORE (GAUZE/BANDAGES/DRESSINGS) ×2 IMPLANT
TOWEL OR 17X24 6PK STRL BLUE (TOWEL DISPOSABLE) ×2 IMPLANT
TOWEL OR 17X26 10 PK STRL BLUE (TOWEL DISPOSABLE) ×2 IMPLANT
TUBE ANAEROBIC SPECIMEN COL (MISCELLANEOUS) ×2 IMPLANT
WATER STERILE IRR 1000ML POUR (IV SOLUTION) ×2 IMPLANT

## 2011-03-15 NOTE — Anesthesia Procedure Notes (Signed)
Procedure Name: Intubation Date/Time: 03/15/2011 5:02 PM Performed by: Glendora Score A Pre-anesthesia Checklist: Patient identified, Emergency Drugs available, Suction available and Patient being monitored Patient Re-evaluated:Patient Re-evaluated prior to inductionOxygen Delivery Method: Circle system utilized Preoxygenation: Pre-oxygenation with 100% oxygen Intubation Type: IV induction Ventilation: Mask ventilation without difficulty Laryngoscope Size: Miller and 2 Grade View: Grade I Tube type: Oral Tube size: 7.5 mm Number of attempts: 1 Airway Equipment and Method: Stylet and LTA kit utilized Placement Confirmation: ETT inserted through vocal cords under direct vision,  positive ETCO2 and breath sounds checked- equal and bilateral Secured at: 22 cm Tube secured with: Tape Dental Injury: Teeth and Oropharynx as per pre-operative assessment

## 2011-03-15 NOTE — H&P (Signed)
Karen Aguilar is an 46 y.o. female.   Chief Complaint:  Back pain and swelling .Marland Kitchen..46 year old female who is 41 postop from a lumbar fusion for pseudarthrosis present the office 2 days ago with a swelling and dressed the incision is aspirated however reaccumulated fall MRI scan showed a fluid collection tracking down to the hardware to be consistent with a seroma hospital from inflammatory reaction the BMP he does not appear to be CSF it does not appear to be abscess. She has some worsening back pain she has some headaches and some right leg stiffness with this. So I recommended irrigation debridement of lumbar wound HPI: See above  Past Medical History  Diagnosis Date  . PONV (postoperative nausea and vomiting)   . Hypothyroidism   . Anemia     etiology unknown-iron infusion in past,last 9/12  . Fibromyalgia     DDD-s/p nerve damage after back surgery  . Headache     migraines  . Incontinence of urine     s/p sling procedure-unresolved  . DDD (degenerative disc disease), lumbar   . Neuropathy of lower extremity 2011  . GERD (gastroesophageal reflux disease)     zantac at times  . Chronic pain   . Sleep apnea     mild-doesn't use cpap    Past Surgical History  Procedure Date  . Eye surgery     lasix  . Cholecystectomy 2001  . Pubovaginal sling 2008  . Shoulder closed reduction 12/18/2010    left; Procedure: CLOSED MANIPULATION SHOULDER;  Surgeon: Javier Docker;  Location: [facility];  Service: Orthopedics;;  labrial debridement  . Back surgery     multiple-L4-s1,hardware,fusion,removal,infection s/p 5/11 procedure  . Back surgery 02/16/11    "my 7th back OR; Procedure: Exploration of fusion removal of hardware L4-S1, harvesting of right iliac crest bone graft, redo posterior lateral fusion L4-5 using iliac crest bone graft BMP and master graft replacement of bilateral L4 screws with a Telfa tech 7.5 x 40 mm screw on the right and 8.5 x 40 mm screw on the left  placement of a large Hemovac drain  . Anterior cervical decomp/discectomy fusion     C3, 4, 5; "I've had 2"  . Cesarean section 1996; 2001  . Tonsillectomy     as a child; "then they grew back"    History reviewed. No pertinent family history. Social History:  reports that she has never smoked. She has never used smokeless tobacco. She reports that she drinks about 1.2 ounces of alcohol per week. She reports that she does not use illicit drugs.  Allergies:  Allergies  Allergen Reactions  . Erythromycin Nausea And Vomiting  . Thimerosal     Local reaction-redness,swelling; veins popping  . Adhesive (Tape)     blisters  . Azithromycin Rash  . Sulfa Antibiotics Rash    Medications Prior to Admission  Medication Dose Route Frequency Provider Last Rate Last Dose  . bacitracin 16109 UNITS injection           . ceFAZolin (ANCEF) 1-5 GM-% IVPB           . mupirocin ointment (BACTROBAN) 2 %           . mupirocin ointment (BACTROBAN) 2 %   Nasal BID [name], MD      . sodium chloride 0.9 % infusion            Medications Prior to Admission  Medication Sig Dispense Refill  .  busPIRone (BUSPAR) 5 MG tablet Take 5 mg by mouth 2 (two) times daily.      Marland Kitchen escitalopram (LEXAPRO) 20 MG tablet Take 30 mg by mouth daily.       Marland Kitchen levothyroxine (SYNTHROID, LEVOTHROID) 50 MCG tablet Take 50 mcg by mouth daily.       . methadone (DOLOPHINE) 10 MG tablet Take 10 mg by mouth every 6 (six) hours.       Marland Kitchen oxymorphone (OPANA) 5 MG tablet Take 5 mg by mouth every 6 (six) hours as needed. For breakthrough pain      . thyroid (ARMOUR) 90 MG tablet Take 90 mg by mouth daily.       Marland Kitchen topiramate (TOPAMAX) 100 MG tablet Take 100 mg by mouth daily.       Marland Kitchen ibuprofen (ADVIL,MOTRIN) 600 MG tablet Take 600 mg by mouth every 6 (six) hours as needed. For shoulder inflammation      . Multiple Vitamin (MULITIVITAMIN WITH MINERALS) TABS Take 1 tablet by mouth daily.      . ranitidine (ZANTAC) 150 MG capsule Take  150 mg by mouth 2 (two) times daily as needed. For indigestion        Results for orders placed during the hospital encounter of 03/15/11 (from the past 48 hour(s))  CBC     Status: Normal   Collection Time   03/15/11  3:02 PM      Component Value Range Comment   WBC 6.5  4.0 - 10.5 (K/uL)    RBC 4.20  3.87 - 5.11 (MIL/uL)    Hemoglobin 12.6  12.0 - 15.0 (g/dL)    HCT 16.1  09.6 - 04.5 (%)    MCV 86.7  78.0 - 100.0 (fL)    MCH 30.0  26.0 - 34.0 (pg)    MCHC 34.6  30.0 - 36.0 (g/dL)    RDW 40.9  81.1 - 91.4 (%)    Platelets 298  150 - 400 (K/uL)   HCG, SERUM, QUALITATIVE     Status: Normal   Collection Time   03/15/11  3:02 PM      Component Value Range Comment   Preg, Serum NEGATIVE  NEGATIVE    COMPREHENSIVE METABOLIC PANEL     Status: Abnormal   Collection Time   03/15/11  3:02 PM      Component Value Range Comment   Sodium 138  135 - 145 (mEq/L)    Potassium 4.2  3.5 - 5.1 (mEq/L)    Chloride 103  96 - 112 (mEq/L)    CO2 25  19 - 32 (mEq/L)    Glucose, Bld 102 (*) 70 - 99 (mg/dL)    BUN 11  6 - 23 (mg/dL)    Creatinine, Ser 7.82  0.50 - 1.10 (mg/dL)    Calcium 9.6  8.4 - 10.5 (mg/dL)    Total Protein 6.9  6.0 - 8.3 (g/dL)    Albumin 3.4 (*) 3.5 - 5.2 (g/dL)    AST 16  0 - 37 (U/L)    ALT 15  0 - 35 (U/L)    Alkaline Phosphatase 101  39 - 117 (U/L)    Total Bilirubin 0.4  0.3 - 1.2 (mg/dL)    GFR calc non Af Amer >90  >90 (mL/min)    GFR calc Af Amer >90  >90 (mL/min)    No results found.  Review of Systems  Constitutional: Positive for malaise/fatigue.  Respiratory: Negative.   Cardiovascular: Negative.   Gastrointestinal: Negative.  Musculoskeletal: Positive for myalgias and back pain.  Neurological: Positive for headaches.    Blood pressure 114/73, pulse 78, temperature 98 F (36.7 C), temperature source Oral, resp. rate 18, height 5\' 7"  (1.702 m), weight 86.183 kg (190 lb), last menstrual period 02/20/2011, SpO2 100.00%. Physical Exam  Constitutional: She  is oriented to person, place, and time. She appears well-developed and well-nourished.  HENT:  Head: Normocephalic.  Eyes: Pupils are equal, round, and reactive to light.  Neck: Normal range of motion.  Respiratory: Breath sounds normal.  GI: Bowel sounds are normal.  Neurological: She is alert and oriented to person, place, and time.       Strength 5 out of 5 except for a 4+ out of 5 dorsalis weakness of the right foot but at her baseline. She does have a swelling in the interest of her incision that blistered up since 2 days ago.     Assessment/Plan Report of presents for I&D of lumbar wound with the risks benefits of this procedure as well as the expectations of outcome  Course alternatives to surgery. she understands and agrees to proceed forward.  Rumi Kolodziej P 03/15/2011, 4:31 PM

## 2011-03-15 NOTE — Progress Notes (Signed)
Care of  Pt assumed by [facility]   RN

## 2011-03-15 NOTE — Op Note (Signed)
Preoperative diagnosis: Possible infected lumbar wound  Postoperative diagnosis: Lumbar wound seroma  Procedure: Irrigation debridement lumbar wound  Surgeon: Jillyn Hidden Shelisa Fern  Anesthesia: Gen.  EBL: Minimal  History of present illness: Patient is a 46 year old female who underwent a lumbar fusion for pseudoarthrosis about 4 weeks a she presented with a swelling in the upper aspect of her lumbar wound that looked like a blister that was noted to have yellow clear fluid draining out of it the initial aspiration in the office was not adequate to decompress this swelling and patient was having significant pain from it as well as there is some erythema around it it appeared it could possibly be an infected seroma or possibly an inflammatory seroma. The patient was recommended I&D of lumbar wound with placement of a drain intraoperatively went over the risks and benefits of the operation perioperative course and expectations of outcome and alternatives of surgery she understood and agreed to proceed forward.  Procedure: Patient brought into the or was just a general anesthesia and positioned prone on the Wilson frame her back was prepped and draped in routine sterile fashion. Her wound was initially aspirated and also was sent for Gram stain culture in both anaerobic and aerobic at which time antibiotics per the IV were given. They've been held preoperatively although she had been on Keflex at home. This blistered and denuded skin was ellipticized and 4 cm incision was made track down the fluid tracked to the. Fusion site on the left side the MRI showed fluid tracked down to the awl this fluid was aspirated and copiously irrigated with bacitracin irrigation additional cultures of been sent prior to IV a box been given and then a Blake drain was placed and the wounds closed in layers with interrupted Vicryl and a running locking nylon in the skin. The wounds and dressed the patient recovered in stable condition. At  the end of case all needle counts and sponge counts were correct per the nurses.

## 2011-03-15 NOTE — Progress Notes (Signed)
Call to [facility], spoke with Ander Slade, communicated surg. Orders are still missing. Erie Noe out of office, but msg. Will be left for her to fax orders to [phone].

## 2011-03-15 NOTE — Preoperative (Signed)
Beta Blockers   Reason not to administer Beta Blockers:Not Applicable 

## 2011-03-15 NOTE — Anesthesia Postprocedure Evaluation (Signed)
  Anesthesia Post-op Note  Patient: Karen Aguilar  Procedure(s) Performed: Procedure(s) (LRB): LUMBAR WOUND DEBRIDEMENT (N/A)  Patient Location: PACU  Anesthesia Type: General  Level of Consciousness: awake and alert   Airway and Oxygen Therapy: Patient Spontanous Breathing  Post-op Pain: mild  Post-op Assessment: Post-op Vital signs reviewed, Patient's Cardiovascular Status Stable, Respiratory Function Stable, Patent Airway and No signs of Nausea or vomiting  Post-op Vital Signs: Reviewed and stable  Complications: No apparent anesthesia complications

## 2011-03-15 NOTE — Anesthesia Preprocedure Evaluation (Addendum)
Anesthesia Evaluation  Patient identified by MRN, date of birth, ID band Patient awake    Reviewed: Allergy & Precautions, H&P , NPO status , Patient's Chart, lab work & pertinent test results  History of Anesthesia Complications (+) PONV  Airway Mallampati: I TM Distance: >3 FB Neck ROM: Full    Dental  (+) Teeth Intact   Pulmonary sleep apnea ,  breath sounds clear to auscultation        Cardiovascular negative cardio ROS  Rhythm:Regular Rate:Normal     Neuro/Psych  Headaches,    GI/Hepatic Neg liver ROS, GERD-  Medicated,  Endo/Other  negative endocrine ROSHypothyroidism   Renal/GU negative Renal ROS     Musculoskeletal  (+) Fibromyalgia -  Abdominal   Peds  Hematology negative hematology ROS (+)   Anesthesia Other Findings   Reproductive/Obstetrics                         Anesthesia Physical Anesthesia Plan  ASA: II  Anesthesia Plan: General   Post-op Pain Management:    Induction: Intravenous  Airway Management Planned: Oral ETT  Additional Equipment:   Intra-op Plan:   Post-operative Plan: Extubation in OR  Informed Consent: I have reviewed the patients History and Physical, chart, labs and discussed the procedure including the risks, benefits and alternatives for the proposed anesthesia with the patient or authorized representative who has indicated his/her understanding and acceptance.   Dental advisory given  Plan Discussed with: CRNA, Anesthesiologist and Surgeon  Anesthesia Plan Comments:         Anesthesia Quick Evaluation

## 2011-03-15 NOTE — Progress Notes (Signed)
Pt c/o pain at iv site--ivf not running, despite flush. dc'd per pt's demand she wants it out. Pt does not appear to have good access.  crna said he will come up and restart iv in pt's room. Explained to pt and she agrees.

## 2011-03-15 NOTE — Transfer of Care (Signed)
Immediate Anesthesia Transfer of Care Note  Patient: Karen Aguilar  Procedure(s) Performed: Procedure(s) (LRB): LUMBAR WOUND DEBRIDEMENT (N/A)  Patient Location: PACU  Anesthesia Type: General  Level of Consciousness: awake  Airway & Oxygen Therapy: Patient Spontanous Breathing and Patient connected to face mask oxygen  Post-op Assessment: Report given to PACU RN and Post -op Vital signs reviewed and stable  Post vital signs: Reviewed and stable  Complications: No apparent anesthesia complications

## 2011-03-16 MED ORDER — METHYLPREDNISOLONE 4 MG PO KIT: PACK | ORAL | Status: AC

## 2011-03-16 MED ORDER — MORPHINE SULFATE 15 MG PO TABS: 15.0000 mg | ORAL_TABLET | ORAL | Status: AC | PRN

## 2011-03-16 NOTE — Discharge Summary (Signed)
  Physician Discharge Summary  Patient ID: FATMA RUTTEN MRN: [id number] DOB/AGE: 04/16/1965 46 y.o.  Admit date: 03/15/2011 Discharge date: 03/16/2011  Admission Diagnoses: Lumbar wound fluid collection  Discharge Diagnoses: Same Active Problems:  * No active hospital problems. *    Discharged Condition: good  Hospital Course: Patient was admitted to the hospital underwent I&D of her lumbar wound cultures were sent Gram stain was initially negative cultures are also negative over the first 24 hours there is some prominent white cells PMNs the most part., So a nice stable for discharge and possibly 1.  Consults: Significant Diagnostic Studies: Treatments: I&D lumbar wound Discharge Exam: Blood pressure 106/64, pulse 67, temperature 97.6 F (36.4 C), temperature source Oral, resp. rate 18, height 5\' 7"  (1.702 m), weight 85.639 kg (188 lb 12.8 oz), last menstrual period 02/20/2011, SpO2 97.00%. Strength out of 5 clean and dry  Disposition: Home   Medication List  As of 03/16/2011  8:22 AM   TAKE these medications         busPIRone 5 MG tablet   Commonly known as: BUSPAR   Take 5 mg by mouth 2 (two) times daily.      cephALEXin 500 MG capsule   Commonly known as: KEFLEX   Take 500 mg by mouth 4 (four) times daily. For 10 days; Start date 03/13/11      escitalopram 20 MG tablet   Commonly known as: LEXAPRO   Take 30 mg by mouth daily.      ibuprofen 600 MG tablet   Commonly known as: ADVIL,MOTRIN   Take 600 mg by mouth every 6 (six) hours as needed. For shoulder inflammation      levothyroxine 50 MCG tablet   Commonly known as: SYNTHROID, LEVOTHROID   Take 50 mcg by mouth daily.      methadone 10 MG tablet   Commonly known as: DOLOPHINE   Take 10 mg by mouth every 6 (six) hours.      methylPREDNISolone 4 MG tablet   Commonly known as: MEDROL DOSEPAK   follow package directions      morphine 15 MG tablet   Commonly known as: MSIR   Take 1 tablet (15 mg total) by  mouth every 4 (four) hours as needed.      mulitivitamin with minerals Tabs   Take 1 tablet by mouth daily.      oxymorphone 5 MG tablet   Commonly known as: OPANA   Take 5 mg by mouth every 6 (six) hours as needed. For breakthrough pain      ranitidine 150 MG capsule   Commonly known as: ZANTAC   Take 150 mg by mouth 2 (two) times daily as needed. For indigestion      thyroid 90 MG tablet   Commonly known as: ARMOUR   Take 90 mg by mouth daily.      topiramate 100 MG tablet   Commonly known as: TOPAMAX   Take 100 mg by mouth daily.             Signed: Makiyah Zentz P 03/16/2011, 8:22 AM

## 2011-03-16 NOTE — Progress Notes (Signed)
Patient d/c instructions and education given to pt. No further questions. D/C home with no signs of acute distress.

## 2011-03-16 NOTE — Discharge Instructions (Signed)
No lifting or bending or twisting no driving a riding a car only she has to. Keep her wound clean dry J-P care as instructed

## 2011-03-16 NOTE — Progress Notes (Signed)
Subjective: Patient reports She's doing fairly well tolerating the pills for pain some incisional soreness but no change in the way or extra-axial.  Objective: Vital signs in last 24 hours: Temp:  [97.6 F (36.4 C)-98 F (36.7 C)] 97.6 F (36.4 C) (03/15 1610) Pulse Rate:  [60-78] 67  (03/15 0608) Resp:  [17-22] 18  (03/15 0608) BP: (92-114)/(57-73) 106/64 mmHg (03/15 0608) SpO2:  [94 %-100 %] 97 % (03/15 0608) Weight:  [85.639 kg (188 lb 12.8 oz)-86.183 kg (190 lb)] 85.639 kg (188 lb 12.8 oz) (03/14 2133)  Intake/Output from previous day: 03/14 0701 - 03/15 0700 In: 1200 [I.V.:1200] Out: 45 [Drains:45] Intake/Output this shift:    Neurologically stable wound is clean and dry drain output is approximately 45 cc of less hours  Lab Results:  Basename 03/15/11 1502  WBC 6.5  HGB 12.6  HCT 36.4  PLT 298   BMET  Basename 03/15/11 1502  NA 138  K 4.2  CL 103  CO2 25  GLUCOSE 102*  BUN 11  CREATININE 0.70  CALCIUM 9.6    Studies/Results: No results found.  Assessment/Plan: Postop I&D lumbar wound will discharge with J-P drain in place  LOS: 1 day     Sharisa Toves P 03/16/2011, 8:19 AM

## 2011-03-16 NOTE — Progress Notes (Signed)
Per PACU RN, CRNA was to come start IV on pt. Pt stated she felt that her pain was controllable on oral medications alone and wished to not have IV access if at all possible.  MD on call was notified of patient's request. Orders received to proceed without IV.

## 2011-03-17 LAB — WOUND CULTURE

## 2011-03-18 LAB — TISSUE CULTURE: Culture: NO GROWTH

## 2011-03-20 ENCOUNTER — Encounter (HOSPITAL_COMMUNITY): Payer: Self-pay | Admitting: Neurosurgery

## 2011-03-20 LAB — ANAEROBIC CULTURE

## 2011-03-28 ENCOUNTER — Ambulatory Visit: Payer: Self-pay | Admitting: Internal Medicine

## 2011-03-28 LAB — IRON AND TIBC
Iron Bind.Cap.(Total): 269 ug/dL (ref 250–450)
Iron Saturation: 30 %
Iron: 80 ug/dL (ref 50–170)
Unbound Iron-Bind.Cap.: 189 ug/dL

## 2011-03-28 LAB — FERRITIN: Ferritin (ARMC): 198 ng/mL (ref 8–388)

## 2011-03-28 LAB — HEMOGLOBIN: HGB: 13.5 g/dL (ref 12.0–16.0)

## 2011-04-02 ENCOUNTER — Ambulatory Visit: Payer: Self-pay | Admitting: Internal Medicine

## 2011-05-02 LAB — HM MAMMOGRAPHY: HM MAMMO: NORMAL

## 2011-06-13 ENCOUNTER — Other Ambulatory Visit: Payer: Self-pay | Admitting: Neurosurgery

## 2011-06-13 DIAGNOSIS — M545 Low back pain, unspecified: Secondary | ICD-10-CM

## 2011-06-15 ENCOUNTER — Ambulatory Visit
Admission: RE | Admit: 2011-06-15 | Discharge: 2011-06-15 | Disposition: A | Discharge status: Home / Self Care | Payer: BC Managed Care – PPO | Source: Ambulatory Visit | Attending: Neurosurgery | Admitting: Neurosurgery

## 2011-06-15 DIAGNOSIS — M545 Low back pain, unspecified: Secondary | ICD-10-CM

## 2011-09-10 ENCOUNTER — Other Ambulatory Visit: Payer: Self-pay | Admitting: Neurosurgery

## 2011-09-10 DIAGNOSIS — M545 Low back pain, unspecified: Secondary | ICD-10-CM

## 2011-09-14 ENCOUNTER — Ambulatory Visit
Admission: RE | Admit: 2011-09-14 | Discharge: 2011-09-14 | Disposition: A | Discharge status: Home / Self Care | Payer: BC Managed Care – PPO | Source: Ambulatory Visit | Attending: Neurosurgery | Admitting: Neurosurgery

## 2011-09-14 DIAGNOSIS — M545 Low back pain, unspecified: Secondary | ICD-10-CM

## 2011-09-14 MED ORDER — GADOBENATE DIMEGLUMINE 529 MG/ML IV SOLN
18.0000 mL | Freq: Once | INTRAVENOUS | Status: AC | PRN
  Administered 2011-09-14: 18 mL via INTRAVENOUS

## 2012-03-18 ENCOUNTER — Other Ambulatory Visit: Payer: Self-pay | Admitting: Neurosurgery

## 2012-03-18 DIAGNOSIS — M545 Low back pain, unspecified: Secondary | ICD-10-CM

## 2012-03-18 DIAGNOSIS — M51379 Other intervertebral disc degeneration, lumbosacral region without mention of lumbar back pain or lower extremity pain: Secondary | ICD-10-CM

## 2012-03-18 DIAGNOSIS — M5137 Other intervertebral disc degeneration, lumbosacral region: Secondary | ICD-10-CM

## 2012-03-18 DIAGNOSIS — M47817 Spondylosis without myelopathy or radiculopathy, lumbosacral region: Secondary | ICD-10-CM

## 2012-04-01 DIAGNOSIS — R911 Solitary pulmonary nodule: Secondary | ICD-10-CM

## 2012-04-01 HISTORY — DX: Solitary pulmonary nodule: R91.1

## 2012-04-03 ENCOUNTER — Ambulatory Visit: Payer: Self-pay | Admitting: Physician Assistant

## 2012-04-04 ENCOUNTER — Ambulatory Visit: Payer: Self-pay | Admitting: Internal Medicine

## 2012-04-21 ENCOUNTER — Encounter: Payer: Self-pay | Admitting: General Surgery

## 2012-04-21 ENCOUNTER — Ambulatory Visit (INDEPENDENT_AMBULATORY_CARE_PROVIDER_SITE_OTHER): Payer: Medicare Other | Admitting: General Surgery

## 2012-04-21 VITALS — BP 132/68 | HR 74 | Resp 14 | Ht 68.0 in | Wt 218.0 lb

## 2012-04-21 DIAGNOSIS — R109 Unspecified abdominal pain: Secondary | ICD-10-CM

## 2012-04-21 NOTE — Patient Instructions (Signed)
Patient advised to have 6 month follow up Scan of the lungs.

## 2012-04-21 NOTE — Progress Notes (Signed)
Patient ID: Karen Aguilar, female   DOB: November 02, 1965, 47 y.o.   MRN: [id number]  Chief Complaint  Patient presents with  . Abdominal Pain    HPI Karen Aguilar is a 47 y.o. female who presents for evaluation abdominal pain. She states pain started approximately 5 weeks ago. She also complains of nausea that started around the same time the abdominal pain started. A CT Scan of the abdomen was performed on 04/03/12 at [facility]. She has also had a right upper quadrant ultrasound done 03/25/12 for nausea, shortness of breath, and right upper quadrant pain.   The patient has had multiple back surgeries and is on chronic narcotics for pain control. She was forced to leave the workforce at [employer] because of her pain.  The patient reports that the pain is in the right flank along the anterior axillar line. It radiates into the back it is described as intense, taking her breath away. She finds the large meals seem to cause increasing discomfort. She has not appreciated any particular food producing pain. Volume seems to be the trigger.  The patient underwent cholecystectomy in 2008. She has had a colonoscopy with Dr. Nils Flack in the past. Abdominal Pain Associated symptoms include nausea.    Past Medical History  Diagnosis Date  . PONV (postoperative nausea and vomiting)   . Hypothyroidism   . Anemia     etiology unknown-iron infusion in past,last 9/12  . Fibromyalgia     DDD-s/p nerve damage after back surgery  . Headache     migraines  . Incontinence of urine     s/p sling procedure-unresolved  . DDD (degenerative disc disease), lumbar   . Neuropathy of lower extremity 2011  . GERD (gastroesophageal reflux disease)     zantac at times  . Chronic pain   . Sleep apnea     mild-doesn't use cpap    Past Surgical History  Procedure Laterality Date  . Eye surgery      lasix  . Cholecystectomy  2001  . Pubovaginal sling  2008  . Shoulder closed reduction  12/18/2010    left; Procedure: CLOSED  MANIPULATION SHOULDER;  Surgeon: Javier Docker;  Location: [facility];  Service: Orthopedics;;  labrial debridement  . Back surgery      multiple-L4-s1,hardware,fusion,removal,infection s/p 5/11 procedure  . Back surgery  02/16/11    "my 7th back OR; Procedure: Exploration of fusion removal of hardware L4-S1, harvesting of right iliac crest bone graft, redo posterior lateral fusion L4-5 using iliac crest bone graft BMP and master graft replacement of bilateral L4 screws with a Telfa tech 7.5 x 40 mm screw on the right and 8.5 x 40 mm screw on the left placement of a large Hemovac drain  . Anterior cervical decomp/discectomy fusion      C3, 4, 5; "I've had 2"  . Cesarean section  1996; 2001  . Tonsillectomy      as a child; "then they grew back"  . Lumbar wound debridement  03/15/2011    Procedure: LUMBAR WOUND DEBRIDEMENT;  Surgeon: [name], MD;  Location: [facility];  Service: Neurosurgery;  Laterality: N/A;  Lumbar Wound Debridement    Family History  Problem Relation Age of Onset  . Cancer Father 6    lung  . Diabetes Brother     Social History History  Substance Use Topics  . Smoking status: Never Smoker   . Smokeless tobacco: Never Used  . Alcohol Use: 1.2  oz/week    2 Glasses of wine per week    Allergies  Allergen Reactions  . Erythromycin Nausea And Vomiting  . Thimerosal     Local reaction-redness,swelling; veins popping  . Adhesive (Tape)     blisters  . Azithromycin Rash  . Other Rash    MACROLIDE ANTIBIOTICS  . Sulfa Antibiotics Rash    Current Outpatient Prescriptions  Medication Sig Dispense Refill  . ALPRAZolam (XANAX) 0.5 MG tablet Take 0.5 mg by mouth at bedtime as needed for sleep.      . Ascorbic Acid (VITAMIN C PO) Take 1 tablet by mouth daily.      . busPIRone (BUSPAR) 5 MG tablet Take 10 mg by mouth 2 (two) times daily.       Marland Kitchen escitalopram (LEXAPRO) 20 MG tablet Take 20 mg by mouth daily.       Marland Kitchen levothyroxine (SYNTHROID,  LEVOTHROID) 50 MCG tablet Take 50 mcg by mouth daily.       Marland Kitchen lubiprostone (AMITIZA) 24 MCG capsule Take 24 mcg by mouth 2 (two) times daily with a meal.      . methadone (DOLOPHINE) 10 MG tablet Take 10 mg by mouth every 6 (six) hours.       Marland Kitchen morphine (MSIR) 15 MG tablet Take 1 tablet by mouth every 4 (four) hours.      . Multiple Vitamin (MULITIVITAMIN WITH MINERALS) TABS Take 1 tablet by mouth daily.      . ranitidine (ZANTAC) 150 MG capsule Take 150 mg by mouth 2 (two) times daily as needed. For indigestion      . thyroid (ARMOUR) 90 MG tablet Take 90 mg by mouth daily.       Marland Kitchen topiramate (TOPAMAX) 100 MG tablet Take 100 mg by mouth daily.       Marland Kitchen VITAMIN D, CHOLECALCIFEROL, PO Take 10,000 Int'l Units by mouth daily.       No current facility-administered medications for this visit.    Review of Systems Review of Systems  Constitutional: Negative.   Respiratory: Negative.   Cardiovascular: Positive for chest pain.  Gastrointestinal: Positive for nausea and abdominal pain.    Blood pressure 132/68, pulse 74, resp. rate 14, height 5\' 8"  (1.727 m), weight 218 lb (98.884 kg).  Physical Exam Physical Exam  Constitutional: She appears well-developed and well-nourished.  Neck: Trachea normal. No mass and no thyromegaly present.  Cardiovascular: Normal rate, regular rhythm, normal heart sounds and normal pulses.   No murmur heard. Pulmonary/Chest: Effort normal and breath sounds normal.  Abdominal: Soft. Normal appearance and bowel sounds are normal. There is tenderness. A hernia is present.  Tender on the tip of the 11th rib.  Less than 1 cm defeat umbilical hernia.     Data Reviewed Record supplied by her primary care physician were reviewed.  Laboratory studies did mark 19, 2014 revealed normal CBC and basic metabolic panel. Normal TSH normal lipase, low normal amylase. Negative urinalysis and culture. Normal T4 and TSH.  Abdominal ultrasound dated March 25, 2012 showed  evidence of prior cholecystectomy. Common bile duct 1 cm. 2 likely cavernous hemangiomas in the liver. No hydronephrosis.  CT scan of the chest dated April 04, 2012 showed a 7 mm indeterminate nodule in the medial basal segment of the right lower lobe. Hypoventilation noted. No CT evidence of pulmonary artery embolic disease.  CT of the abdomen dated April 03, 2012 showed likely hemangiomas within the liver. Triphasic CT versus MRI to be considered if  indicated. Postsurgical changes in the soft tissues of the lumbar sacral region. Moderate fecal retention.  There was no report of enlargement of the lesions in the liver compared to previous studies.  The common bile duct diameter is at the upper limit of normal for post cholecystectomy patient, possibly elevated by chronic narcotic use. A  Assessment    Right flank pain. Possibly related to radiation from the back versus musculoskeletal inflammation along insertion of the abdominal wall muscles this area. No suggestion of intra-abdominal pathology.     Plan    Conservative management of the likely musculoskeletal pain has been recommended.        Earline Mayotte 04/22/2012, 8:49 PM

## 2012-04-22 ENCOUNTER — Encounter: Payer: Self-pay | Admitting: General Surgery

## 2012-04-22 DIAGNOSIS — R109 Unspecified abdominal pain: Secondary | ICD-10-CM | POA: Insufficient documentation

## 2012-05-02 ENCOUNTER — Telehealth: Payer: Self-pay | Admitting: *Deleted

## 2012-05-02 NOTE — Telephone Encounter (Signed)
Patient called asking if Dr Lemar Livings had a chance to talk with Dr Michela Pitcher regarding her pain/CT scans.  I reviewed the notes with the patient but did not see any discussion with Dr Michela Pitcher.  Informed her of the musculoskeletal and conservative care. Please call her with any additional information. Thanks

## 2012-05-10 NOTE — Telephone Encounter (Signed)
2001 hospital records reviewed when she presented with acute cholecystitis. Op note and admission H&P reviewed. Path/ cholangiogram reports were not available on line.  Multiple lumbar and cervical spine MRI studies reviewed. No evidence of intra-abdominal process evident at this time.    Hospital records showed iron deficiency anemia treated with IV iron secondary to poor oral tolerance.    Cc: Beverely Risen, M.D.

## 2012-05-13 NOTE — Telephone Encounter (Signed)
Notified patient as instructed, patient agrees. Discussed follow-up appointments with PCP as needed.  She states she is "better than she was", has "good days and bad days".

## 2012-08-28 ENCOUNTER — Ambulatory Visit: Payer: Self-pay | Admitting: Neurosurgery

## 2012-09-11 ENCOUNTER — Ambulatory Visit: Payer: Self-pay | Admitting: Neurosurgery

## 2012-12-22 ENCOUNTER — Ambulatory Visit: Payer: Self-pay | Admitting: Internal Medicine

## 2013-01-15 ENCOUNTER — Ambulatory Visit: Payer: Self-pay

## 2013-01-28 HISTORY — PX: OTHER SURGICAL HISTORY: SHX169

## 2013-01-28 LAB — PULMONARY FUNCTION TEST

## 2013-01-30 ENCOUNTER — Ambulatory Visit: Payer: Self-pay

## 2013-02-01 ENCOUNTER — Ambulatory Visit (INDEPENDENT_AMBULATORY_CARE_PROVIDER_SITE_OTHER): Payer: Medicare Other | Admitting: Emergency Medicine

## 2013-02-01 VITALS — BP 110/66 | HR 90 | Temp 98.3°F | Resp 16 | Ht 67.8 in | Wt 234.6 lb

## 2013-02-01 DIAGNOSIS — R609 Edema, unspecified: Secondary | ICD-10-CM

## 2013-02-01 DIAGNOSIS — R109 Unspecified abdominal pain: Secondary | ICD-10-CM

## 2013-02-01 DIAGNOSIS — M549 Dorsalgia, unspecified: Secondary | ICD-10-CM | POA: Insufficient documentation

## 2013-02-01 DIAGNOSIS — R932 Abnormal findings on diagnostic imaging of liver and biliary tract: Secondary | ICD-10-CM

## 2013-02-01 DIAGNOSIS — E8779 Other fluid overload: Secondary | ICD-10-CM

## 2013-02-01 LAB — POCT UA - MICROSCOPIC ONLY
BACTERIA, U MICROSCOPIC: NEGATIVE
CASTS, UR, LPF, POC: NEGATIVE
Crystals, Ur, HPF, POC: NEGATIVE
MUCUS UA: NEGATIVE
RBC, urine, microscopic: NEGATIVE
YEAST UA: NEGATIVE

## 2013-02-01 LAB — COMPREHENSIVE METABOLIC PANEL
ALBUMIN: 4.1 g/dL (ref 3.5–5.2)
ALT: 23 U/L (ref 0–35)
AST: 15 U/L (ref 0–37)
Alkaline Phosphatase: 45 U/L (ref 39–117)
BUN: 11 mg/dL (ref 6–23)
CALCIUM: 9.1 mg/dL (ref 8.4–10.5)
CHLORIDE: 103 meq/L (ref 96–112)
CO2: 27 meq/L (ref 19–32)
Creat: 0.79 mg/dL (ref 0.50–1.10)
GLUCOSE: 97 mg/dL (ref 70–99)
POTASSIUM: 3.9 meq/L (ref 3.5–5.3)
SODIUM: 139 meq/L (ref 135–145)
TOTAL PROTEIN: 6.6 g/dL (ref 6.0–8.3)
Total Bilirubin: 0.3 mg/dL (ref 0.2–1.2)

## 2013-02-01 LAB — POCT CBC
GRANULOCYTE PERCENT: 59.1 % (ref 37–80)
HEMATOCRIT: 43 % (ref 37.7–47.9)
Hemoglobin: 13.4 g/dL (ref 12.2–16.2)
Lymph, poc: 1.5 (ref 0.6–3.4)
MCH, POC: 28.3 pg (ref 27–31.2)
MCHC: 31.2 g/dL — AB (ref 31.8–35.4)
MCV: 90.8 fL (ref 80–97)
MID (cbc): 0.5 (ref 0–0.9)
MPV: 8.2 fL (ref 0–99.8)
POC GRANULOCYTE: 2.8 (ref 2–6.9)
POC LYMPH PERCENT: 30.7 %L (ref 10–50)
POC MID %: 10.2 %M (ref 0–12)
Platelet Count, POC: 221 10*3/uL (ref 142–424)
RBC: 4.74 M/uL (ref 4.04–5.48)
RDW, POC: 14.9 %
WBC: 4.8 10*3/uL (ref 4.6–10.2)

## 2013-02-01 LAB — POCT URINALYSIS DIPSTICK
BILIRUBIN UA: NEGATIVE
Blood, UA: NEGATIVE
Glucose, UA: NEGATIVE
KETONES UA: NEGATIVE
LEUKOCYTES UA: NEGATIVE
NITRITE UA: NEGATIVE
PROTEIN UA: NEGATIVE
Spec Grav, UA: 1.01
Urobilinogen, UA: 0.2
pH, UA: 7.5

## 2013-02-01 LAB — TSH: TSH: 0.951 u[IU]/mL (ref 0.350–4.500)

## 2013-02-01 LAB — AFP TUMOR MARKER: AFP TUMOR MARKER: 1.3 ng/mL (ref 0.0–8.0)

## 2013-02-01 LAB — T4, FREE: FREE T4: 0.93 ng/dL (ref 0.80–1.80)

## 2013-02-01 LAB — POCT SEDIMENTATION RATE: POCT SED RATE: 52 mm/h — AB (ref 0–22)

## 2013-02-01 NOTE — Patient Instructions (Signed)
I have sent your records to referrals to see if we can get you scheduled for an MRI of the abdomen, referral to internal medicine, and referral to a GI specialist for their opinion.

## 2013-02-01 NOTE — Progress Notes (Addendum)
Subjective:    Patient ID: Karen Aguilar, female    DOB: 1965/11/24, 48 y.o.   MRN: [id number]  HPI Chief Complaint  Patient presents with  . Flank Pain    right side pain, was told she has lesion on her liver  . Bronchitis    cough for months has set up primary care with [facility] but appt is not until mid march.  . Lung Lesion    nodule, pt has CT scan  . Edema    swelling in abdomen (this is her primary complaint)   This chart was scribed for [name], MD by Thea Alken, ED Scribe. This patient was seen in room 3 and the patient's care was started at 1:38 PM.  HPI Comments: Karen Aguilar is a 48 y.o. female who presents to the [facility] complaining of right sided flank pain and bronchitis. She reports that this all started with right sided flank pain since before the spring of 2014. Pt reports that she has lesions on her liver and has been seeing Dr. Mable Paris who is her PCP. She believes that the flank pain is from the lesions. Pt reports she has had a bladder sling since 2008. She reports spine problems and has had 9 operation on her spine and that she has nerve damage. Pt was being seen by Dr. Weston Settle, her neurologist. Pt is in a wheelchair but is not wheelchair confined. She can not lift anything over 5 lb, stand for long periods, or exercise. She is with [facility] and sees Dr. Doren Custard every month.  She had a CT done by Dr. Mable Paris who sent her to  Dr. Tollie Pizza intern specialist who she believes did not help. She reports he told her that she probably pulled a muscle. She states that no one can figure out what is wrong with her.  She is being seen today because she does not believe she has bronchitis. She states that no one can figure out where her symptoms are from. She reports 1 month ago she started gaining weight, coughing and swelling in her feet. She reports that she has Hypothyroidism and states that her thyroid was off. She reports that she has fluid  retention in her legs, feet and lower abdomen. She is unable to sleep at night due to the fluid and states that she has to sit up to sleep. She also has numbness in her right leg. Pt reports that she has h/o not being able to walk for 4 years. She reports that she has night sweats. She also states that she has been producing white foamy sputum. She reports that she had a chest x-ray 2 days ago and that she has been prescribed antibiotics. She reports they diagnosed her with bronchitis. She also had a lung function test that came back normal. Pt drinks a lot of fluids constantly has to urinate. She has a kidney stone in the left kidney that is larger than a half of centimeter. She reports that it sunk to the bottom of the kidney and reports that Dr. Bufford Spikes, her urologist, wants to leave the stone where it is.   Pt reports that she has low bone marrow. She states that this was found during a CT scan on left shoulder.  She had and operation done by Dr. Graylin Shiver. She reports that she has to use a for assistance. Pt is on a lot of medication is concerned that drinking coffee interferes with the  medication   Pt is extremely concerned about her health and reports that she does not know what to do. She reports that she is getting sicker and sicker and is terrified that she will have a MI. She states that this is a miserable life to live.    Past Medical History  Diagnosis Date  . PONV (postoperative nausea and vomiting)   . Hypothyroidism   . Anemia     etiology unknown-iron infusion in past,last 9/12  . Fibromyalgia     DDD-s/p nerve damage after back surgery  . Headache(784.0)     migraines  . Incontinence of urine     s/p sling procedure-unresolved  . DDD (degenerative disc disease), lumbar   . Neuropathy of lower extremity 2011  . GERD (gastroesophageal reflux disease)     zantac at times  . Chronic pain   . Sleep apnea     mild-doesn't use cpap  . Depression   . Allergy   . Arachnoiditis      Allergies  Allergen Reactions  . Erythromycin Nausea And Vomiting  . Thimerosal     Local reaction-redness,swelling; veins popping  . Adhesive [Tape]     blisters  . Azithromycin Rash  . Other Rash    MACROLIDE ANTIBIOTICS  . Sulfa Antibiotics Rash   Prior to Admission medications   Medication Sig Start Date End Date Taking? Authorizing Provider  baclofen (LIORESAL) 10 MG tablet Take 10 mg by mouth 2 (two) times daily.   Yes Historical Provider, MD  escitalopram (LEXAPRO) 20 MG tablet Take 20 mg by mouth daily.    Yes Historical Provider, MD  levothyroxine (SYNTHROID, LEVOTHROID) 50 MCG tablet Take 50 mcg by mouth daily.    Yes Historical Provider, MD  lidocaine (XYLOCAINE) 5 % ointment Apply 1 application topically 3 (three) times daily as needed.   Yes Historical Provider, MD  lubiprostone (AMITIZA) 24 MCG capsule Take 24 mcg by mouth 2 (two) times daily with a meal.   Yes Historical Provider, MD  methadone (DOLOPHINE) 10 MG tablet Take 10 mg by mouth every 6 (six) hours.    Yes Historical Provider, MD  morphine (MSIR) 15 MG tablet Take 1 tablet by mouth every 4 (four) hours. 03/21/12  Yes Historical Provider, MD  thyroid (ARMOUR) 90 MG tablet Take 90 mg by mouth daily.    Yes Historical Provider, MD  topiramate (TOPAMAX) 100 MG tablet Take 100 mg by mouth daily.    Yes Historical Provider, MD  ALPRAZolam Prudy Feeler) 0.5 MG tablet Take 0.5 mg by mouth at bedtime as needed for sleep.    Historical Provider, MD  Ascorbic Acid (VITAMIN C PO) Take 1 tablet by mouth daily.    Historical Provider, MD  busPIRone (BUSPAR) 5 MG tablet Take 10 mg by mouth 2 (two) times daily.     Historical Provider, MD  Multiple Vitamin (MULITIVITAMIN WITH MINERALS) TABS Take 1 tablet by mouth daily.    Historical Provider, MD  ranitidine (ZANTAC) 150 MG capsule Take 150 mg by mouth 2 (two) times daily as needed. For indigestion    Historical Provider, MD  VITAMIN D, CHOLECALCIFEROL, PO Take 10,000 Int'l Units by  mouth daily.    Historical Provider, MD   Review of Systems  Constitutional: Positive for diaphoresis. Negative for fever and chills.  Respiratory: Positive for cough.   Musculoskeletal:       Swelling in feet       Objective:   Physical Exam CONSTITUTIONAL: Well developed/well nourished  HEAD: Normocephalic/atraumatic EYES: EOMI/PERRL ENMT: Mucous membranes moist NECK: supple no meningeal signs SPINE:entire spine nontender CV: S1/S2 noted, no murmurs/rubs/gallops noted LUNGS: Lungs are clear to auscultation bilaterally, no apparent distress ABDOMEN: soft, nontender, no rebound or guarding GU:no cva tenderness NEURO: Pt is awake/alert, moves all extremitiesx4 EXTREMITIES: pulses normal, full ROM SKIN: warm, color normal PSYCH: no abnormalities of mood noted Results for orders placed in visit on 02/01/13  POCT CBC      Result Value Range   WBC 4.8  4.6 - 10.2 K/uL   Lymph, poc 1.5  0.6 - 3.4   POC LYMPH PERCENT 30.7  10 - 50 %L   MID (cbc) 0.5  0 - 0.9   POC MID % 10.2  0 - 12 %M   POC Granulocyte 2.8  2 - 6.9   Granulocyte percent 59.1  37 - 80 %G   RBC 4.74  4.04 - 5.48 M/uL   Hemoglobin 13.4  12.2 - 16.2 g/dL   HCT, POC 43.0  37.7 - 47.9 %   MCV 90.8  80 - 97 fL   MCH, POC 28.3  27 - 31.2 pg   MCHC 31.2 (*) 31.8 - 35.4 g/dL   RDW, POC 14.9     Platelet Count, POC 221  142 - 424 K/uL   MPV 8.2  0 - 99.8 fL  POCT URINALYSIS DIPSTICK      Result Value Range   Color, UA yellow     Clarity, UA clear     Glucose, UA neg     Bilirubin, UA neg     Ketones, UA neg     Spec Grav, UA 1.010     Blood, UA neg     pH, UA 7.5     Protein, UA neg     Urobilinogen, UA 0.2     Nitrite, UA neg     Leukocytes, UA Negative    POCT UA - MICROSCOPIC ONLY      Result Value Range   WBC, Ur, HPF, POC 0-1     RBC, urine, microscopic neg     Bacteria, U Microscopic neg     Mucus, UA neg     Epithelial cells, urine per micros 0-2     Crystals, Ur, HPF, POC neg     Casts, Ur,  LPF, POC neg     Yeast, UA neg        Assessment & Plan:   patient been referred renal status.for an MRI the abdomen with contrast. Blood work was done to check on her renal function. Referral made to internal medicine and also to gastroenterology.

## 2013-02-02 ENCOUNTER — Encounter: Payer: Self-pay | Admitting: Emergency Medicine

## 2013-02-03 ENCOUNTER — Other Ambulatory Visit: Payer: Self-pay | Admitting: Radiology

## 2013-02-03 ENCOUNTER — Encounter: Payer: Self-pay | Admitting: Emergency Medicine

## 2013-02-03 DIAGNOSIS — R935 Abnormal findings on diagnostic imaging of other abdominal regions, including retroperitoneum: Secondary | ICD-10-CM

## 2013-02-03 NOTE — Addendum Note (Signed)
Addended byCandice Camp on: 02/03/2013 09:54 AM   Modules accepted: Orders

## 2013-02-04 ENCOUNTER — Telehealth: Payer: Self-pay

## 2013-02-04 ENCOUNTER — Encounter: Payer: Self-pay | Admitting: *Deleted

## 2013-02-04 NOTE — Telephone Encounter (Signed)
I called patient and advised her that she keep all of the appointments that have been made for her . I advised her to keep her appointment with the nurse practitioner and also apparently Dr. Chancy Milroy had made an appointment with cardiologist and to keep that appointment. I have  called and spoken with the patient

## 2013-02-04 NOTE — Telephone Encounter (Signed)
Patient called back upset that [place] refuses to schedule her in sooner- because they see she has an upcoming appt in March  With Dr. Danise Mina and patient is feeling extremely upset. I called [facility] and they said that patient really misunderstood that they were trying to transfer her to [place] Dr. Danise Mina office at [facility] because they could possibly see her sooner but patient misunderstood and called [facility]. I have called Dr. Chesley Mires office and they have informed me since she is new they are not able to get her in sooner with him but only a nurse practioner Peter Garter.. I called patient and she wants to speak to Dr. Everlene Farrier about this and what he wants her to do; she is not sure.   Best for patient: [phone] (M)

## 2013-02-06 ENCOUNTER — Encounter: Payer: Self-pay | Admitting: Internal Medicine

## 2013-02-06 ENCOUNTER — Encounter: Payer: Medicare Other | Admitting: Internal Medicine

## 2013-02-06 ENCOUNTER — Encounter: Payer: Self-pay | Admitting: Gastroenterology

## 2013-02-06 NOTE — Progress Notes (Signed)
Pre-visit discussion using our clinic review tool. No additional management support is needed unless otherwise documented below in the visit note.  

## 2013-02-08 ENCOUNTER — Encounter: Payer: Self-pay | Admitting: Family Medicine

## 2013-02-09 ENCOUNTER — Encounter: Payer: Self-pay | Admitting: Family Medicine

## 2013-02-09 ENCOUNTER — Ambulatory Visit (INDEPENDENT_AMBULATORY_CARE_PROVIDER_SITE_OTHER): Payer: Medicare Other | Admitting: Family Medicine

## 2013-02-09 VITALS — BP 122/70 | HR 88 | Temp 98.0°F | Wt 236.5 lb

## 2013-02-09 DIAGNOSIS — F418 Other specified anxiety disorders: Secondary | ICD-10-CM

## 2013-02-09 DIAGNOSIS — E559 Vitamin D deficiency, unspecified: Secondary | ICD-10-CM

## 2013-02-09 DIAGNOSIS — K219 Gastro-esophageal reflux disease without esophagitis: Secondary | ICD-10-CM

## 2013-02-09 DIAGNOSIS — E785 Hyperlipidemia, unspecified: Secondary | ICD-10-CM

## 2013-02-09 DIAGNOSIS — G43909 Migraine, unspecified, not intractable, without status migrainosus: Secondary | ICD-10-CM

## 2013-02-09 DIAGNOSIS — R1011 Right upper quadrant pain: Secondary | ICD-10-CM

## 2013-02-09 DIAGNOSIS — F341 Dysthymic disorder: Secondary | ICD-10-CM

## 2013-02-09 DIAGNOSIS — IMO0001 Reserved for inherently not codable concepts without codable children: Secondary | ICD-10-CM

## 2013-02-09 DIAGNOSIS — E611 Iron deficiency: Secondary | ICD-10-CM

## 2013-02-09 DIAGNOSIS — D509 Iron deficiency anemia, unspecified: Secondary | ICD-10-CM

## 2013-02-09 DIAGNOSIS — M5137 Other intervertebral disc degeneration, lumbosacral region: Secondary | ICD-10-CM

## 2013-02-09 DIAGNOSIS — G039 Meningitis, unspecified: Secondary | ICD-10-CM

## 2013-02-09 DIAGNOSIS — D649 Anemia, unspecified: Secondary | ICD-10-CM

## 2013-02-09 DIAGNOSIS — E039 Hypothyroidism, unspecified: Secondary | ICD-10-CM

## 2013-02-09 DIAGNOSIS — R05 Cough: Secondary | ICD-10-CM

## 2013-02-09 DIAGNOSIS — G4730 Sleep apnea, unspecified: Secondary | ICD-10-CM

## 2013-02-09 DIAGNOSIS — R059 Cough, unspecified: Secondary | ICD-10-CM

## 2013-02-09 DIAGNOSIS — M5136 Other intervertebral disc degeneration, lumbar region: Secondary | ICD-10-CM

## 2013-02-09 DIAGNOSIS — G8929 Other chronic pain: Secondary | ICD-10-CM

## 2013-02-09 DIAGNOSIS — M797 Fibromyalgia: Secondary | ICD-10-CM

## 2013-02-09 NOTE — Progress Notes (Signed)
Pre-visit discussion using our clinic review tool. No additional management support is needed unless otherwise documented below in the visit note.  

## 2013-02-09 NOTE — Patient Instructions (Signed)
Let's await MRI on Thursday.  Return to see me next week for follow up. Sign release for records from Dr. Ma Hillock at [facility]. Good to see you today, call us with questions.

## 2013-02-09 NOTE — Progress Notes (Signed)
BP 122/70  Pulse 88  Temp(Src) 98 F (36.7 C) (Oral)  Wt 236 lb 8 oz (107.276 kg)  LMP 01/22/2013   CC: new pt to establish  Subjective:    Patient ID: Karen Aguilar, female    DOB: Apr 25, 1965, 48 y.o.   MRN: [id number]  HPI: Karen Aguilar is a 48 y.o. female presenting on 02/09/2013 with Establish Care  48 yo referred from Dr. Everlene Farrier at [place] to establish care here.  She previously saw Dr. Duard Brady.  Switched providers because "was tired of getting only symptoms treated" and was only seeing physician extenders.  She brings a folder with multiple imaging studies.  She states she's asked for records from prior PCP to be sent here.  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  On disability for this. Chronic pain - chronic lumbar pain s/p multiple surgeries with residual nerve damage (S1 nerve root damage), followed by Dr. Hardin Negus' pain clinic under pain contract and on methadone and MSIR Hypothyroidism - "hashimoto type" on both synthroid and armour thyroid for last several years.  Has not seen endocrinologist.  Several concerns today.   Recent pulm nodules - but seem to have resolved on latest imaging study. Recent weight gain and pedal edema Extreme fatigue.  Daytime somnolence.  Waking up at 2am. Over the last 2 months, noticing leg swelling, fluid in lungs with foamy sputum.  Has been treated for bronchitis with abx courses x3 and prednisone with no improvement.  States she's had normal echo in past except for mild MVP. Recent liver hemangiomas with R sided abd pain ongoing for last 1+ year. RUQ abd pain - ongoing since spring 2014 with radiation to below R ribcage.  Comes and goes.   Has been told had low bone marrow in humerus on CT scan 2012.  Reviewed recent blood work by [employer] including normal CBC, CMP, AFP, TFTs, UA and elevated ESR to 50s. Reviewed folder she brings: CT abd 12/2012 - 1.4cm low density liver lesion at dome of liver thought to reflect hemangioma.    Lives with  husband and 2 daughters (one in college) Occupation: was Public affairs consultant Disability since 2011 - arachnoiditis Edu: associate's degree Activity: severe limitations 2/2 arachnoiditis and chronic pain Diet: good water, fruits/vegetables daily  Preventative: Well woman: Dr. Rance Muir OBGYN Mammogram 2014 LMP 01/2013  Relevant past medical, surgical, family and social history reviewed and updated. Allergies and medications reviewed and updated. Current Outpatient Prescriptions on File Prior to Visit  Medication Sig  . Ascorbic Acid (VITAMIN C PO) Take 1 tablet by mouth daily.  . baclofen (LIORESAL) 10 MG tablet Take 10 mg by mouth 3 (three) times daily.   Marland Kitchen escitalopram (LEXAPRO) 20 MG tablet Take 20 mg by mouth daily.   . furosemide (LASIX) 20 MG tablet Take 10 mg by mouth at bedtime.   Marland Kitchen lubiprostone (AMITIZA) 24 MCG capsule Take 24 mcg by mouth 2 (two) times daily with a meal.  . methadone (DOLOPHINE) 10 MG tablet Take 10 mg by mouth every 6 (six) hours.   Marland Kitchen morphine (MSIR) 15 MG tablet Take 1 tablet by mouth every 4 (four) hours. 1-2 tablets every 4-6 hours  . thyroid (ARMOUR) 90 MG tablet Take 90 mg by mouth daily.   Marland Kitchen topiramate (TOPAMAX) 100 MG tablet Take 100 mg by mouth daily.   Marland Kitchen lidocaine (XYLOCAINE) 5 % ointment Apply 1 application topically 3 (three) times daily as needed.  . ranitidine (ZANTAC) 150  MG capsule Take 150 mg by mouth 2 (two) times daily as needed. For indigestion  . VITAMIN D, CHOLECALCIFEROL, PO Take 10,000 Int'l Units by mouth daily.   No current facility-administered medications on file prior to visit.    Review of Systems  Constitutional: Positive for chills and fatigue. Negative for fever, activity change, appetite change and unexpected weight change (weight gain).  HENT: Negative for hearing loss.   Eyes: Negative for visual disturbance.  Respiratory: Positive for cough and shortness of breath. Negative for chest tightness and wheezing.    Cardiovascular: Positive for chest pain and leg swelling. Negative for palpitations.  Gastrointestinal: Positive for nausea, abdominal pain and constipation. Negative for vomiting, diarrhea, blood in stool and abdominal distention.  Endocrine: Positive for cold intolerance.  Genitourinary: Negative for hematuria and difficulty urinating.  Musculoskeletal: Negative for arthralgias, myalgias and neck pain.  Skin: Negative for rash.  Neurological: Positive for dizziness and headaches. Negative for seizures and syncope.  Hematological: Negative for adenopathy. Does not bruise/bleed easily.  Psychiatric/Behavioral: Positive for dysphoric mood. The patient is nervous/anxious.    Per HPI unless specifically indicated above    Objective:    BP 122/70  Pulse 88  Temp(Src) 98 F (36.7 C) (Oral)  Wt 236 lb 8 oz (107.276 kg)  LMP 01/22/2013  Physical Exam  Nursing note and vitals reviewed. Constitutional: She is oriented to person, place, and time. She appears well-developed and well-nourished. No distress.  HENT:  Head: Normocephalic and atraumatic.  Nose: Nose normal.  Mouth/Throat: Uvula is midline, oropharynx is clear and moist and mucous membranes are normal. No oropharyngeal exudate, posterior oropharyngeal edema or posterior oropharyngeal erythema.  Eyes: Conjunctivae and EOM are normal. Pupils are equal, round, and reactive to light. No scleral icterus.  Neck: Normal range of motion. Neck supple. No thyromegaly present.  Cardiovascular: Normal rate, regular rhythm, normal heart sounds and intact distal pulses.   No murmur heard. Pulses:      Radial pulses are 2+ on the right side, and 2+ on the left side.  Pulmonary/Chest: Effort normal and breath sounds normal. No respiratory distress. She has no wheezes. She has no rales.  Abdominal: Soft. Normal appearance and bowel sounds are normal. She exhibits no distension and no mass. There is tenderness (mild) in the right upper quadrant.  There is no rebound and no guarding.  obese  Musculoskeletal: Normal range of motion. She exhibits edema (mild nonpitting).  Lymphadenopathy:    She has no cervical adenopathy.  Neurological: She is alert and oriented to person, place, and time.  CN grossly intact, station and gait intact  Skin: Skin is warm and dry. No rash noted.  Psychiatric: She has a normal mood and affect. Her behavior is normal. Judgment and thought content normal.   Results for orders placed in visit on 02/01/13  COMPREHENSIVE METABOLIC PANEL      Result Value Range   Sodium 139  135 - 145 mEq/L   Potassium 3.9  3.5 - 5.3 mEq/L   Chloride 103  96 - 112 mEq/L   CO2 27  19 - 32 mEq/L   Glucose, Bld 97  70 - 99 mg/dL   BUN 11  6 - 23 mg/dL   Creat 0.79  0.50 - 1.10 mg/dL   Total Bilirubin 0.3  0.2 - 1.2 mg/dL   Alkaline Phosphatase 45  39 - 117 U/L   AST 15  0 - 37 U/L   ALT 23  0 - 35 U/L  Total Protein 6.6  6.0 - 8.3 g/dL   Albumin 4.1  3.5 - 5.2 g/dL   Calcium 9.1  8.4 - 10.5 mg/dL  TSH      Result Value Range   TSH 0.951  0.350 - 4.500 uIU/mL  T4, FREE      Result Value Range   Free T4 0.93  0.80 - 1.80 ng/dL  AFP TUMOR MARKER      Result Value Range   AFP-Tumor Marker 1.3  0.0 - 8.0 ng/mL  POCT CBC      Result Value Range   WBC 4.8  4.6 - 10.2 K/uL   Lymph, poc 1.5  0.6 - 3.4   POC LYMPH PERCENT 30.7  10 - 50 %L   MID (cbc) 0.5  0 - 0.9   POC MID % 10.2  0 - 12 %M   POC Granulocyte 2.8  2 - 6.9   Granulocyte percent 59.1  37 - 80 %G   RBC 4.74  4.04 - 5.48 M/uL   Hemoglobin 13.4  12.2 - 16.2 g/dL   HCT, POC 43.0  37.7 - 47.9 %   MCV 90.8  80 - 97 fL   MCH, POC 28.3  27 - 31.2 pg   MCHC 31.2 (*) 31.8 - 35.4 g/dL   RDW, POC 14.9     Platelet Count, POC 221  142 - 424 K/uL   MPV 8.2  0 - 99.8 fL  POCT SEDIMENTATION RATE      Result Value Range   POCT SED RATE 52 (*) 0 - 22 mm/hr  POCT URINALYSIS DIPSTICK      Result Value Range   Color, UA yellow     Clarity, UA clear     Glucose,  UA neg     Bilirubin, UA neg     Ketones, UA neg     Spec Grav, UA 1.010     Blood, UA neg     pH, UA 7.5     Protein, UA neg     Urobilinogen, UA 0.2     Nitrite, UA neg     Leukocytes, UA Negative    POCT UA - MICROSCOPIC ONLY      Result Value Range   WBC, Ur, HPF, POC 0-1     RBC, urine, microscopic neg     Bacteria, U Microscopic neg     Mucus, UA neg     Epithelial cells, urine per micros 0-2     Crystals, Ur, HPF, POC neg     Casts, Ur, LPF, POC neg     Yeast, UA neg        Assessment & Plan:   Problem List Items Addressed This Visit   RESOLVED: Anemia     Not an active problem.    Arachnoiditis     On disability for this with chronic lower back pain.    Chronic pain     Continue to follow with Dr. Hardin Negus.  under pain contract with them    Cough     Persistent foamy cough refractory to 3 abx courses, prednisone course.  I wondered about CHF but patient endorses recently normal echo. Denies sxs of GERD, doubt occult GERD related cough.    DDD (degenerative disc disease), lumbar     Discussing spine stimulator placement with Dr. Saintclair Halsted.    Depression with anxiety     Continue lexapro    Fibromyalgia   GERD (gastroesophageal reflux disease)  Controlled with rare zantac.  No GERD sxs currently.    Hyperlipidemia      Check FLP next fasting blood work.  Currently off meds. No results found for this basename: CHOL, HDL, LDLCALC, LDLDIRECT, TRIG, CHOLHDL      Hypothyroidism      Continue synthroid and armour thyroid at current dose. Lab Results  Component Value Date   TSH 0.951 02/01/2013      Relevant Medications      levothyroxine (SYNTHROID) 75 MCG tablet   Iron deficiency   Migraines     Continue topamax.    RUQ abdominal pain - Primary     Pt's primary concern today.  Ongoing for last 8 months.  Known hepatic hemangiomas but all less that 2cm in size, doubt symptomatic. Pt is s/p cholecystectomy, and there is no mention of h/o fatty liver on  recent imaging studies. Pending RUQ MRI for Thursday. I asked her to return next week after MRI to discuss further treatment plan. Only abnormal lab value was elevated ESR to 50s. A total of 60 minutes were spent face-to-face with the patient during this encounter and over half of that time was spent on counseling and coordination of care    Sleep apnea   Vitamin D deficiency       Follow up plan: Return in about 1 week (around 02/16/2013) for follow up.

## 2013-02-10 ENCOUNTER — Encounter: Payer: Self-pay | Admitting: Family Medicine

## 2013-02-10 ENCOUNTER — Encounter: Payer: Self-pay | Admitting: Gastroenterology

## 2013-02-10 ENCOUNTER — Ambulatory Visit (INDEPENDENT_AMBULATORY_CARE_PROVIDER_SITE_OTHER): Payer: Medicare Other | Admitting: Gastroenterology

## 2013-02-10 VITALS — BP 98/60 | HR 80 | Ht 67.0 in | Wt 233.2 lb

## 2013-02-10 DIAGNOSIS — M5136 Other intervertebral disc degeneration, lumbar region: Secondary | ICD-10-CM | POA: Insufficient documentation

## 2013-02-10 DIAGNOSIS — G43109 Migraine with aura, not intractable, without status migrainosus: Secondary | ICD-10-CM | POA: Insufficient documentation

## 2013-02-10 DIAGNOSIS — G039 Meningitis, unspecified: Secondary | ICD-10-CM | POA: Insufficient documentation

## 2013-02-10 DIAGNOSIS — R05 Cough: Secondary | ICD-10-CM | POA: Insufficient documentation

## 2013-02-10 DIAGNOSIS — K219 Gastro-esophageal reflux disease without esophagitis: Secondary | ICD-10-CM | POA: Insufficient documentation

## 2013-02-10 DIAGNOSIS — R1011 Right upper quadrant pain: Secondary | ICD-10-CM

## 2013-02-10 DIAGNOSIS — E559 Vitamin D deficiency, unspecified: Secondary | ICD-10-CM | POA: Insufficient documentation

## 2013-02-10 DIAGNOSIS — K648 Other hemorrhoids: Secondary | ICD-10-CM

## 2013-02-10 DIAGNOSIS — K5900 Constipation, unspecified: Secondary | ICD-10-CM

## 2013-02-10 DIAGNOSIS — E038 Other specified hypothyroidism: Secondary | ICD-10-CM | POA: Insufficient documentation

## 2013-02-10 DIAGNOSIS — E039 Hypothyroidism, unspecified: Secondary | ICD-10-CM | POA: Insufficient documentation

## 2013-02-10 DIAGNOSIS — G894 Chronic pain syndrome: Secondary | ICD-10-CM | POA: Insufficient documentation

## 2013-02-10 DIAGNOSIS — K5909 Other constipation: Secondary | ICD-10-CM | POA: Insufficient documentation

## 2013-02-10 DIAGNOSIS — M797 Fibromyalgia: Secondary | ICD-10-CM | POA: Insufficient documentation

## 2013-02-10 DIAGNOSIS — G4730 Sleep apnea, unspecified: Secondary | ICD-10-CM | POA: Insufficient documentation

## 2013-02-10 DIAGNOSIS — R059 Cough, unspecified: Secondary | ICD-10-CM | POA: Insufficient documentation

## 2013-02-10 DIAGNOSIS — D649 Anemia, unspecified: Secondary | ICD-10-CM | POA: Insufficient documentation

## 2013-02-10 DIAGNOSIS — E785 Hyperlipidemia, unspecified: Secondary | ICD-10-CM | POA: Insufficient documentation

## 2013-02-10 DIAGNOSIS — R932 Abnormal findings on diagnostic imaging of liver and biliary tract: Secondary | ICD-10-CM

## 2013-02-10 DIAGNOSIS — G4733 Obstructive sleep apnea (adult) (pediatric): Secondary | ICD-10-CM | POA: Insufficient documentation

## 2013-02-10 DIAGNOSIS — R051 Acute cough: Secondary | ICD-10-CM | POA: Insufficient documentation

## 2013-02-10 DIAGNOSIS — F331 Major depressive disorder, recurrent, moderate: Secondary | ICD-10-CM | POA: Insufficient documentation

## 2013-02-10 DIAGNOSIS — E611 Iron deficiency: Secondary | ICD-10-CM | POA: Insufficient documentation

## 2013-02-10 MED ORDER — PANTOPRAZOLE SODIUM 40 MG PO TBEC: 40.0000 mg | DELAYED_RELEASE_TABLET | Freq: Every day | ORAL | Status: DC

## 2013-02-10 NOTE — Assessment & Plan Note (Signed)
Check FLP next fasting blood work.  Currently off meds. No results found for this basename: CHOL, HDL, LDLCALC, LDLDIRECT, TRIG, CHOLHDL

## 2013-02-10 NOTE — Assessment & Plan Note (Signed)
Probable liver hemangiomas.  Plan MRI

## 2013-02-10 NOTE — Assessment & Plan Note (Signed)
Continue topamax

## 2013-02-10 NOTE — Assessment & Plan Note (Signed)
Right upper quadrant postprandial pain may be due to ulcer or nonulcer dyspepsia.  Pain from a retained stone is also a concern although liver tests are normal.  She has bile duct dilatation which could be seen post cholecystectomy.  Pain from liver hemangiomas is unlikely.  Recommendations #1 add MRCP to already ordered MRI #2 begin PPI therapy #3 upper endoscopy #4 to consider a trial of anticholinergics pending results of above

## 2013-02-10 NOTE — Assessment & Plan Note (Signed)
The patient has symptomatic hemorrhoids which no doubt are exacerbated by chronic constipation with straining.  Recommendations #1 to consider band ligation once constipation issues have resolved

## 2013-02-10 NOTE — Assessment & Plan Note (Signed)
Continue lexapro  ?

## 2013-02-10 NOTE — Assessment & Plan Note (Signed)
Persistent foamy cough refractory to 3 abx courses, prednisone course.  I wondered about CHF but patient endorses recently normal echo. Denies sxs of GERD, doubt occult GERD related cough.

## 2013-02-10 NOTE — Progress Notes (Signed)
_                                                                                                                History of Present Illness: 48 year old white female referred for evaluation of abdominal pain.  For the past year she's been complaining of fairly frequent postprandial right upper quadrant pain.  Pain is sharp and may radiate to her back.  It occasionally awakens her.  It is particularly more pronounced when she eats a large meal.  It occasionally is spontaneous.  She is status post cholecystectomy in 2001.  Liver tests are normal.  Abdominal ultrasound demonstrated a bile duct measuring 1 cm, and liver hemangiomas.  She complains of chronic nonproductive cough and bubbling in her chest.  She denies pyrosis, per se but has bloating and nausea. She denies dysphagia.  She is on chronic narcotics and suffers from severe constipation.  On Amitiza bowels are erratic.  She has rectal discomfort and problems with hygiene that she attributes to hemorrhoids.    Past Medical History  Diagnosis Date  . PONV (postoperative nausea and vomiting)   . Hypothyroidism   . Anemia     etiology unknown-iron infusion in past,last 9/12  . Fibromyalgia   . Headache(784.0)     migraines  . Incontinence of urine     s/p sling procedure-unresolved  . GERD (gastroesophageal reflux disease)     rare zantac PRN  . Chronic pain     pain contract with Dr. Eartha Inch [facility]  . Sleep apnea     mild-doesn't use cpap  . Depression with anxiety   . Arachnoiditis   . Hyperlipidemia   . Migraines     and h/o optical migraine  . Vitamin D deficiency   . Iron deficiency   . DDD (degenerative disc disease), lumbar     s/p permanent nerve damage after back surgery  . DDD (degenerative disc disease), cervical   . Neuropathy of lower extremity 2011  . Hemorrhoid   . Anxiety   . Arthritis   . Diverticulosis   . Gallstones   . IBS (irritable bowel syndrome)   . Kidney stones    Past  Surgical History  Procedure Laterality Date  . Lasik Bilateral   . Cholecystectomy  2001  . Pubovaginal sling  2008  . Shoulder closed reduction Left 12/18/2010    Procedure: CLOSED MANIPULATION SHOULDER;  Surgeon: Johnn Hai; labrial debridement  . Back surgery      multiple-L4-s1,hardware,fusion,removal,infection s/p 5/11 procedure  . Back surgery  02/16/11    "my 7th back OR; Procedure: Exploration of fusion removal of hardware L4-S1, harvesting of right iliac crest bone graft, redo posterior lateral fusion L4-5 using iliac crest bone graft BMP and master graft replacement of bilateral L4 screws with a Telfa tech 7.5 x 40 mm screw on the right and 8.5 x 40 mm screw on the left placement of a large Hemovac drain  . Anterior cervical decomp/discectomy fusion  C3, 4, 5 - improved after surgery  . Cesarean section  1996; 2001    x 2  . Tonsillectomy      as a child; "then they grew back"  . Lumbar wound debridement  03/15/2011    Procedure: LUMBAR WOUND DEBRIDEMENT;  Surgeon: Elaina Hoops, MD;  Lumbar Wound Debridement  . Spine surgery     family history includes Alzheimer's disease in her maternal grandmother; Breast cancer in her maternal aunt; CAD in her maternal grandfather; Cancer in her other; Colon cancer in her paternal grandmother; Diabetes in her brother and daughter; Dysphagia in her mother; Irritable bowel syndrome in her mother; Lung cancer in her paternal grandfather; Lung cancer (age of onset: 29) in her father; Thyroid disease in her cousin and mother. Current Outpatient Prescriptions  Medication Sig Dispense Refill  . Ascorbic Acid (VITAMIN C PO) Take 1 tablet by mouth as needed.       . baclofen (LIORESAL) 10 MG tablet Take 10 mg by mouth 3 (three) times daily.       Marland Kitchen escitalopram (LEXAPRO) 20 MG tablet Take 20 mg by mouth daily.       . furosemide (LASIX) 20 MG tablet Take 10 mg by mouth at bedtime.       Marland Kitchen levothyroxine (SYNTHROID) 75 MCG tablet Take 75 mcg by  mouth daily before breakfast.      . lidocaine (XYLOCAINE) 5 % ointment Apply 1 application topically 3 (three) times daily as needed.      . lubiprostone (AMITIZA) 24 MCG capsule Take 24 mcg by mouth 2 (two) times daily with a meal.      . methadone (DOLOPHINE) 10 MG tablet Take 10 mg by mouth every 6 (six) hours.       Marland Kitchen morphine (MSIR) 15 MG tablet Take 1 tablet by mouth every 4 (four) hours. 1-2 tablets every 4-6 hours      . thyroid (ARMOUR) 90 MG tablet Take 90 mg by mouth daily.       Marland Kitchen topiramate (TOPAMAX) 100 MG tablet Take 100 mg by mouth daily.       Marland Kitchen VITAMIN D, CHOLECALCIFEROL, PO Take 10,000 Int'l Units by mouth as needed.        No current facility-administered medications for this visit.   Allergies as of 02/10/2013 - Review Complete 02/10/2013  Allergen Reaction Noted  . Erythromycin Nausea And Vomiting 12/14/2010  . Thimerosal  12/14/2010  . Adhesive [tape]  12/14/2010  . Azithromycin Rash 02/02/2011  . Other Rash 04/21/2012  . Sulfa antibiotics Rash 12/14/2010    reports that she has never smoked. She has never used smokeless tobacco. She reports that she drinks alcohol. She reports that she does not use illicit drugs.     Review of Systems: She complains of chronic back pain for which she is taking narcotics, and fatigue.  Pertinent positive and negative review of systems were noted in the above HPI section. All other review of systems were otherwise negative.  Vital signs were reviewed in today's medical record Physical Exam: General: Well developed , well nourished, no acute distress Skin: anicteric Head: Normocephalic and atraumatic Eyes:  sclerae anicteric, EOMI Ears: Normal auditory acuity Mouth: No deformity or lesions Neck: Supple, no masses or thyromegaly Lungs: Clear throughout to auscultation Heart: Regular rate and rhythm; no murmurs, rubs or bruits Abdomen: Soft, non tender and non distended. No masses, hepatosplenomegaly or hernias noted. Normal  Bowel sounds.  There is no succussion splash Rectal:  There are no external abnormalities Musculoskeletal: Symmetrical with no gross deformities  Skin: No lesions on visible extremities Pulses:  Normal pulses noted Extremities: No clubbing, cyanosis, edema or deformities noted Neurological: Alert oriented x 4, grossly nonfocal Cervical Nodes:  No significant cervical adenopathy Inguinal Nodes: No significant inguinal adenopathy Psychological:  Alert and cooperative. Normal mood and affect  See Assessment and Plan under Problem List

## 2013-02-10 NOTE — Patient Instructions (Signed)
You have been scheduled for an endoscopy with propofol. Please follow written instructions given to you at your visit today. If you use inhalers (even only as needed), please bring them with you on the day of your procedure. Your physician has requested that you go to www.startemmi.com and enter the access code given to you at your visit today. This web site gives a general overview about your procedure. However, you should still follow specific instructions given to you by our office regarding your preparation for the procedure.  Your MRCP has been scheduled along with your MRI at [facility]

## 2013-02-10 NOTE — Assessment & Plan Note (Signed)
Discussing spine stimulator placement with Dr. Saintclair Halsted.

## 2013-02-10 NOTE — Assessment & Plan Note (Signed)
Not an active problem

## 2013-02-10 NOTE — Assessment & Plan Note (Signed)
Controlled with rare zantac.  No GERD sxs currently.

## 2013-02-10 NOTE — Assessment & Plan Note (Signed)
Continue synthroid and armour thyroid at current dose. Lab Results  Component Value Date   TSH 0.951 02/01/2013

## 2013-02-10 NOTE — Assessment & Plan Note (Signed)
Bloating and nausea, chronic cough maybe symptoms of GERD.  Recommendations #1 trial of Protonix

## 2013-02-10 NOTE — Assessment & Plan Note (Signed)
On disability for this with chronic lower back pain.

## 2013-02-10 NOTE — Assessment & Plan Note (Signed)
Chronic constipation is probably due to narcotic use.  Patient may be eligible for a clinical trial for narcotic-related constipation.

## 2013-02-10 NOTE — Assessment & Plan Note (Signed)
Continue to follow with Dr. Hardin Negus.  under pain contract with them

## 2013-02-10 NOTE — Assessment & Plan Note (Signed)
Pt's primary concern today.  Ongoing for last 8 months.  Known hepatic hemangiomas but all less that 2cm in size, doubt symptomatic. Pt is s/p cholecystectomy, and there is no mention of h/o fatty liver on recent imaging studies. Pending RUQ MRI for Thursday. I asked her to return next week after MRI to discuss further treatment plan. Only abnormal lab value was elevated ESR to 50s. A total of 60 minutes were spent face-to-face with the patient during this encounter and over half of that time was spent on counseling and coordination of care

## 2013-02-12 ENCOUNTER — Other Ambulatory Visit: Payer: Self-pay | Admitting: Emergency Medicine

## 2013-02-12 ENCOUNTER — Encounter: Payer: Self-pay | Admitting: Cardiovascular Disease

## 2013-02-12 ENCOUNTER — Encounter: Payer: Self-pay | Admitting: Emergency Medicine

## 2013-02-12 ENCOUNTER — Encounter: Payer: Self-pay | Admitting: Gastroenterology

## 2013-02-12 ENCOUNTER — Ambulatory Visit (INDEPENDENT_AMBULATORY_CARE_PROVIDER_SITE_OTHER): Payer: Medicare Other | Admitting: Cardiovascular Disease

## 2013-02-12 ENCOUNTER — Ambulatory Visit
Admission: RE | Admit: 2013-02-12 | Discharge: 2013-02-12 | Disposition: A | Discharge status: Home / Self Care | Payer: Medicare Other | Source: Ambulatory Visit | Attending: Emergency Medicine | Admitting: Emergency Medicine

## 2013-02-12 VITALS — BP 114/80 | HR 72 | Ht 67.0 in | Wt 230.2 lb

## 2013-02-12 DIAGNOSIS — R600 Localized edema: Secondary | ICD-10-CM

## 2013-02-12 DIAGNOSIS — R609 Edema, unspecified: Secondary | ICD-10-CM

## 2013-02-12 DIAGNOSIS — R079 Chest pain, unspecified: Secondary | ICD-10-CM

## 2013-02-12 DIAGNOSIS — R935 Abnormal findings on diagnostic imaging of other abdominal regions, including retroperitoneum: Secondary | ICD-10-CM

## 2013-02-12 NOTE — Patient Instructions (Signed)
Follow up as needed

## 2013-02-13 ENCOUNTER — Encounter: Payer: Self-pay | Admitting: Cardiovascular Disease

## 2013-02-13 DIAGNOSIS — R600 Localized edema: Secondary | ICD-10-CM | POA: Insufficient documentation

## 2013-02-13 NOTE — Assessment & Plan Note (Signed)
The patient has mild bilateral leg edema likely due to some degree of chronic venous insufficiency. I don't see any signs of congestive heart failure. Physical exam is unremarkable and baseline EKG is normal. She also had a recent echocardiogram done outside which showed normal LV systolic and diastolic function with no significant valvular abnormalities. Recent labs were unremarkable including normal BNP. Albumin level was normal. I don't see a cardiac etiology for her symptoms. She has no symptoms suggestive of angina. She can followup with [place] as needed.

## 2013-02-13 NOTE — Progress Notes (Signed)
Primary care physician: Dr. Clayborn Bigness but switching to Dr. Danise Mina  HPI  This is a 48 year old Caucasian female who was referred for evaluation of fluid retention on lower extremity edema. She has no previous cardiac history. She has chronic medical conditions that include anxiety, depression, hyperlipidemia, hypothyroidism, migraines, degenerative disc disease and chronic back pain. She reports having an upper respiratory tract infection in December and since then she continued to have intermittent episodes of cough. This has been associated with postnasal drip and lower extremity edema. She describes coughing up "bubbles". No orthopnea or PND. No chest pain. She had a chest x-ray done which showed no acute abnormalities. She also had an echocardiogram which showed normal LV systolic function with an ejection fraction of 57%, trace mitral regurgitation, normal diastolic function, and no evidence of pulmonary hypertension. The echocardiogram was done at Dr. Laurelyn Sickle office. The images are not available for interpretation. She underwent labs which showed normal renal function and electrolytes. Albumin level was normal at 4.3. CBC was normal. TSH was normal. BNP was only 89.  Allergies  Allergen Reactions  . Erythromycin Nausea And Vomiting  . Thimerosal     Local reaction-redness,swelling; veins popping  . Adhesive [Tape]     blisters  . Azithromycin Rash  . Other Rash    MACROLIDE ANTIBIOTICS  . Sulfa Antibiotics Rash     Current Outpatient Prescriptions on File Prior to Visit  Medication Sig Dispense Refill  . Ascorbic Acid (VITAMIN C PO) Take 1 tablet by mouth as needed.       . baclofen (LIORESAL) 10 MG tablet Take 10 mg by mouth 3 (three) times daily.       Marland Kitchen escitalopram (LEXAPRO) 20 MG tablet Take 20 mg by mouth daily.       . furosemide (LASIX) 20 MG tablet Take 10 mg by mouth at bedtime.       Marland Kitchen levothyroxine (SYNTHROID) 75 MCG tablet Take 75 mcg by mouth daily before breakfast.       . lidocaine (XYLOCAINE) 5 % ointment Apply 1 application topically 3 (three) times daily as needed.      . lubiprostone (AMITIZA) 24 MCG capsule Take 24 mcg by mouth 2 (two) times daily with a meal.      . methadone (DOLOPHINE) 10 MG tablet Take 10 mg by mouth every 6 (six) hours.       Marland Kitchen morphine (MSIR) 15 MG tablet Take 1 tablet by mouth every 4 (four) hours. 1-2 tablets every 4-6 hours      . pantoprazole (PROTONIX) 40 MG tablet Take 1 tablet (40 mg total) by mouth daily.  30 tablet  3  . thyroid (ARMOUR) 90 MG tablet Take 90 mg by mouth daily.       Marland Kitchen topiramate (TOPAMAX) 100 MG tablet Take 100 mg by mouth daily.       Marland Kitchen VITAMIN D, CHOLECALCIFEROL, PO Take 10,000 Int'l Units by mouth as needed.        No current facility-administered medications on file prior to visit.     Past Medical History  Diagnosis Date  . PONV (postoperative nausea and vomiting)   . Hypothyroidism   . Anemia     etiology unknown-iron infusion in past,last 9/12  . Fibromyalgia   . Headache(784.0)     migraines  . Incontinence of urine     s/p sling procedure-unresolved  . GERD (gastroesophageal reflux disease)     rare zantac PRN  . Chronic pain  pain contract with Dr. Eartha Inch Pain  . Sleep apnea     mild-doesn't use cpap  . Depression with anxiety   . Arachnoiditis   . Hyperlipidemia   . Migraines     and h/o optical migraine  . Vitamin D deficiency   . Iron deficiency   . DDD (degenerative disc disease), lumbar     s/p permanent nerve damage after back surgery  . DDD (degenerative disc disease), cervical   . Neuropathy of lower extremity 2011  . Hemorrhoid   . Anxiety   . Arthritis   . Diverticulosis   . Gallstones   . IBS (irritable bowel syndrome)   . Kidney stones   . Lung nodules     mild     Past Surgical History  Procedure Laterality Date  . Lasik Bilateral   . Cholecystectomy  2001  . Pubovaginal sling  2008  . Shoulder closed reduction Left 12/18/2010     Procedure: CLOSED MANIPULATION SHOULDER;  Surgeon: Johnn Hai; labrial debridement  . Back surgery      multiple-L4-s1,hardware,fusion,removal,infection s/p 5/11 procedure  . Back surgery  02/16/11    "my 7th back OR; Procedure: Exploration of fusion removal of hardware L4-S1, harvesting of right iliac crest bone graft, redo posterior lateral fusion L4-5 using iliac crest bone graft BMP and master graft replacement of bilateral L4 screws with a Telfa tech 7.5 x 40 mm screw on the right and 8.5 x 40 mm screw on the left placement of a large Hemovac drain  . Anterior cervical decomp/discectomy fusion      C3, 4, 5 - improved after surgery  . Cesarean section  1996; 2001    x 2  . Tonsillectomy      as a child; "then they grew back"  . Lumbar wound debridement  03/15/2011    Procedure: LUMBAR WOUND DEBRIDEMENT;  Surgeon: Elaina Hoops, MD;  Lumbar Wound Debridement  . Spine surgery       Family History  Problem Relation Age of Onset  . Lung cancer Father 75     (smoker)  . Diabetes Brother   . Lung cancer Paternal Grandfather      (smoker)  . Colon cancer Paternal Grandmother   . Thyroid disease Mother   . Irritable bowel syndrome Mother   . Dysphagia Mother   . Thyroid disease Cousin   . Cancer Other     unsure (ovarian/uterine)  . Breast cancer Maternal Aunt   . CAD Maternal Grandfather     MI  . Heart disease Maternal Grandfather   . Heart attack Maternal Grandfather   . Alzheimer's disease Maternal Grandmother   . Diabetes Daughter      History   Social History  . Marital Status: Married    Spouse Name: N/A    Number of Children: 2  . Years of Education: N/A   Occupational History  . disabled    Social History Main Topics  . Smoking status: Never Smoker   . Smokeless tobacco: Never Used  . Alcohol Use: No  . Drug Use: No  . Sexual Activity: Not Currently   Other Topics Concern  . Not on file   Social History Narrative   Lives with husband and 2  daughters (one in college)   Occupation: was Public affairs consultant   Disability since 2011 - arachnoiditis   Edu: associate's degree   Activity: severe limitations 2/2 arachnoiditis and chronic pain   Diet: good  water, fruits/vegetables daily     ROS A 10 point review of system was performed. It is negative other than that mentioned in the history of present illness.   PHYSICAL EXAM   BP 114/80  Pulse 72  Ht 5\' 7"  (1.702 m)  Wt 230 lb 4 oz (104.441 kg)  BMI 36.05 kg/m2  LMP 01/22/2013 Constitutional: She is oriented to person, place, and time. She appears well-developed and well-nourished. No distress.  HENT: No nasal discharge.  Head: Normocephalic and atraumatic.  Eyes: Pupils are equal and round. No discharge.  Neck: Normal range of motion. Neck supple. No JVD present. No thyromegaly present.  Cardiovascular: Normal rate, regular rhythm, normal heart sounds. Exam reveals no gallop and no friction rub. No murmur heard.  Pulmonary/Chest: Effort normal and breath sounds normal. No stridor. No respiratory distress. She has no wheezes. She has no rales. She exhibits no tenderness.  Abdominal: Soft. Bowel sounds are normal. She exhibits no distension. There is no tenderness. There is no rebound and no guarding.  Musculoskeletal: Normal range of motion. She exhibits trace edema and no tenderness.  Neurological: She is alert and oriented to person, place, and time. Coordination normal.  Skin: Skin is warm and dry. No rash noted. She is not diaphoretic. No erythema. No pallor.  Psychiatric: She has a normal mood and affect. Her behavior is normal. Judgment and thought content normal.     EKG: Normal sinus rhythm with no significant ST or T wave changes   ASSESSMENT AND PLAN

## 2013-02-16 ENCOUNTER — Ambulatory Visit: Payer: Medicare Other | Admitting: Family Medicine

## 2013-02-16 ENCOUNTER — Ambulatory Visit (INDEPENDENT_AMBULATORY_CARE_PROVIDER_SITE_OTHER): Payer: Medicare Other | Admitting: Family Medicine

## 2013-02-16 ENCOUNTER — Encounter: Payer: Self-pay | Admitting: Family Medicine

## 2013-02-16 VITALS — BP 114/82 | HR 84 | Temp 98.2°F | Wt 213.2 lb

## 2013-02-16 DIAGNOSIS — R1011 Right upper quadrant pain: Secondary | ICD-10-CM

## 2013-02-16 NOTE — Patient Instructions (Signed)
Continue f/u with Dr. Myrla Halsted do have a small nodule under skin on right abdominal wall, seems to reproduce pain you've felt.  Continue heating pad, massage area daily.  If not improving, we could see surgeon for opinion.

## 2013-02-16 NOTE — Progress Notes (Signed)
BP 114/82  Pulse 84  Temp(Src) 98.2 F (36.8 C) (Oral)  Wt 213 lb 4 oz (96.73 kg)  LMP 01/22/2013  CC: 1 wk f/u  Subjective:    Patient ID: Karen Aguilar, female    DOB: [birth year], 48 y.o.   MRN: [id number]  HPI: Karen Aguilar is a 48 y.o. female presenting on 02/16/2013 with Follow-up  Karen Aguilar presents today for 1 wk f/u of last visit. See prior visit for details.  Since last seen here, established with [facility] and had MRCP added to her abd MRI - unrevealing; clear ducts, 2 known hemangiomas.  Was unable to tolerate entire session 2/2 arachnoiditis - unable to complete contrasted MRI.  Also possible EGD planned.  Started on PPI protonix 40mg  daily for possible dyspepsia component, has not noted improvement.  Also established with Dr. Fletcher Anon who though she didn't have an active cardiac issue.  Leg edema thought due to CVI.  Continued GI distress. No appetite despite trying to eat bland foods.  20 lb weight loss over the last week - however pt attributes this to fluid loss.  Continued constant RUQ pain.  Described as under right ribcage - tender to the touch, tender bandlike pain that travels to back.  Dull ache, occasionally sharp stabs interspersed.  Heating pad helps.  Denies recent vesicular rash.  Adamant about not wanting to mask pain with meds but rather wants to find etiology.  Reviewed MRI with patient IMPRESSION:  No acute intra-abdominal abnormality.  Mild common duct prominence generally considered to be within normal  limits post cholecystectomy, without intraductal filling defect  identified allowing for mild motion artifact.  Probable hepatic hemangiomas reidentified.    Wt Readings from Last 3 Encounters:  02/16/13 213 lb 4 oz (96.73 kg)  02/12/13 230 lb 4 oz (104.441 kg)  02/10/13 233 lb 4 oz (105.802 kg)   Body mass index is 33.39 kg/(m^2).  Relevant past medical, surgical, family and social history reviewed and updated. Allergies and medications reviewed  and updated. Current Outpatient Prescriptions on File Prior to Visit  Medication Sig  . Ascorbic Acid (VITAMIN C PO) Take 1 tablet by mouth as needed.   . baclofen (LIORESAL) 10 MG tablet Take 10 mg by mouth 3 (three) times daily.   Marland Kitchen escitalopram (LEXAPRO) 20 MG tablet Take 20 mg by mouth daily.   Marland Kitchen levothyroxine (SYNTHROID) 75 MCG tablet Take 75 mcg by mouth daily before breakfast. DAW; DAW; DAW; DAW  . lidocaine (XYLOCAINE) 5 % ointment Apply 1 application topically 3 (three) times daily as needed.  . lubiprostone (AMITIZA) 24 MCG capsule Take 24 mcg by mouth 2 (two) times daily with a meal.  . methadone (DOLOPHINE) 10 MG tablet Take 10 mg by mouth every 6 (six) hours.   Marland Kitchen morphine (MSIR) 15 MG tablet Take 1 tablet by mouth every 4 (four) hours. 1-2 tablets every 4-6 hours  . pantoprazole (PROTONIX) 40 MG tablet Take 1 tablet (40 mg total) by mouth daily.  Marland Kitchen thyroid (ARMOUR) 90 MG tablet Take 90 mg by mouth daily.   Marland Kitchen topiramate (TOPAMAX) 100 MG tablet Take 100 mg by mouth daily.   Marland Kitchen VITAMIN D, CHOLECALCIFEROL, PO Take 10,000 Int'l Units by mouth as needed.   . furosemide (LASIX) 20 MG tablet Take 10 mg by mouth at bedtime.    No current facility-administered medications on file prior to visit.    Review of Systems Per HPI unless specifically indicated above  Objective:    BP 114/82  Pulse 84  Temp(Src) 98.2 F (36.8 C) (Oral)  Wt 213 lb 4 oz (96.73 kg)  LMP 01/22/2013  Physical Exam  Nursing note and vitals reviewed. Constitutional: She appears well-developed and well-nourished. No distress.  HENT:  Mouth/Throat: Oropharynx is clear and moist. No oropharyngeal exudate.  Eyes: Conjunctivae and EOM are normal. Pupils are equal, round, and reactive to light.  Cardiovascular: Normal rate, regular rhythm, normal heart sounds and intact distal pulses.   No murmur heard. Pulmonary/Chest: Effort normal and breath sounds normal. No respiratory distress. She has no wheezes. She has  no rales.  Abdominal: Soft. Normal appearance and bowel sounds are normal. She exhibits no distension and no mass. There is no tenderness. There is no rebound and no guarding.    No pain with palpation of abdomen when supine, discomfort present when sitting up. Subcutaneous nodule palpated right mid abdomen, reproduces pain felt, very tender to palpation  Musculoskeletal: She exhibits no edema.  Skin: Skin is warm and dry. No rash noted.       Assessment & Plan:   Problem List Items Addressed This Visit   Abdominal pain, right upper quadrant - Primary     Recent MRI/MRCP unrevealing as to cause of pain.  Pending f/u with GI for EGD 03/06/2013. She is very tender to palpation of subcutaneous nodule appreciated on exam today.  ?scar tissue leading to pinched cutaneous nerve.  Pt declines gabapentin/lyrica and states she has not tolerated either well in the past. I recommend she finish GI workup, continue meds prescribed including protonix and pain regimen per Dr. Hardin Negus and if unrevealing, consider surgery referral for eval of subcutaneous nodule - anesthetic injection vs excision.        Follow up plan: Return if symptoms worsen or fail to improve.

## 2013-02-16 NOTE — Progress Notes (Signed)
Pre-visit discussion using our clinic review tool. No additional management support is needed unless otherwise documented below in the visit note.  

## 2013-02-17 ENCOUNTER — Telehealth: Payer: Self-pay

## 2013-02-17 NOTE — Telephone Encounter (Signed)
PT STATES SOMEONE JUST CALLED REGARDING HER MRI RESULTS, SHE MISSED THE CALL. YOU MAY REACH PT AT [phone]

## 2013-02-17 NOTE — Progress Notes (Signed)
Quick Note:  Please inform the patient that MRCP was normal and to continue current plan of action ______

## 2013-02-18 ENCOUNTER — Encounter: Payer: Self-pay | Admitting: Family Medicine

## 2013-02-18 ENCOUNTER — Other Ambulatory Visit: Payer: Self-pay

## 2013-02-18 MED ORDER — LINACLOTIDE 145 MCG PO CAPS: 145.0000 ug | ORAL_CAPSULE | Freq: Every day | ORAL | Status: DC

## 2013-02-18 NOTE — Assessment & Plan Note (Addendum)
Recent MRI/MRCP unrevealing as to cause of pain.  Pending f/u with GI for EGD 03/06/2013. She is very tender to palpation of subcutaneous nodule appreciated on exam today.  ?scar tissue leading to pinched cutaneous nerve.  Pt declines gabapentin/lyrica and states she has not tolerated either well in the past. I recommend she finish GI workup, continue meds prescribed including protonix and pain regimen per Dr. Hardin Negus and if unrevealing, consider surgery referral for eval of subcutaneous nodule - anesthetic injection vs excision.

## 2013-02-24 ENCOUNTER — Encounter: Payer: Self-pay | Admitting: *Deleted

## 2013-03-01 HISTORY — PX: ESOPHAGOGASTRODUODENOSCOPY: SHX1529

## 2013-03-02 ENCOUNTER — Ambulatory Visit (AMBULATORY_SURGERY_CENTER): Payer: Medicare Other | Admitting: Gastroenterology

## 2013-03-02 ENCOUNTER — Encounter: Payer: Self-pay | Admitting: Gastroenterology

## 2013-03-02 ENCOUNTER — Institutional Professional Consult (permissible substitution): Payer: Self-pay | Admitting: Pulmonary Disease

## 2013-03-02 VITALS — BP 100/53 | HR 76 | Temp 99.2°F | Resp 15 | Ht 67.0 in | Wt 233.0 lb

## 2013-03-02 DIAGNOSIS — K222 Esophageal obstruction: Secondary | ICD-10-CM

## 2013-03-02 DIAGNOSIS — R1011 Right upper quadrant pain: Secondary | ICD-10-CM

## 2013-03-02 MED ORDER — GLYCOPYRROLATE 2 MG PO TABS
ORAL_TABLET | ORAL | Status: DC
Start: 2013-03-02 — End: 2013-03-18

## 2013-03-02 MED ORDER — GLYCOPYRROLATE 2 MG PO TABS
ORAL_TABLET | ORAL | Status: DC
Start: 2013-03-02 — End: 2013-03-02

## 2013-03-02 MED ORDER — SODIUM CHLORIDE 0.9 % IV SOLN: 500.0000 mL | INTRAVENOUS | Status: DC

## 2013-03-02 NOTE — Progress Notes (Signed)
Patient denied any pain when I asked her from the list.  She is on major pain killers.   Teary -eyed.  Stated that she needed a [pillow between her legs, and she had to be on her side.   Very flat affect noted.

## 2013-03-02 NOTE — Progress Notes (Signed)
Report to pacu rn, vss, bbs=clear 

## 2013-03-02 NOTE — Op Note (Addendum)
[place]  [address] [place], [address]   ENDOSCOPY PROCEDURE REPORT  PATIENT: Karen, Aguilar  MR#: [id number] BIRTHDATE: January 17, 1965 , 35  yrs. old GENDER: Female ENDOSCOPIST: Inda Castle, MD REFERRED BY:  Unk Pinto, M.D. PROCEDURE DATE:  03/02/2013 PROCEDURE:  EGD, diagnostic and Maloney dilation of esophagus ASA CLASS:     Class II INDICATIONS:  Dysphagia.   Epigastric pain. MEDICATIONS: MAC sedation, administered by CRNA, Propofol (Diprivan) 120 mg IV, and Simethicone 0.6cc PO TOPICAL ANESTHETIC: Cetacaine Spray  DESCRIPTION OF PROCEDURE: After the risks benefits and alternatives of the procedure were thoroughly explained, informed consent was obtained.  The [id number] endoscope was introduced through the mouth and advanced to the third portion of the duodenum. Without limitations.  The instrument was slowly withdrawn as the mucosa was fully examined.        ESOPHAGUS: An early stricture was found at the gastroesophageal junction.  The stenosis was traversable with the endoscope.  The remainder of the upper endoscopy exam was otherwise normal. Retroflexed views revealed no abnormalities.     The scope was then withdrawn from the patient.  A #52 Isabell Jarvis dilator was passed with minimal resistance.  There was no heme.  COMPLICATIONS: There were no complications. ENDOSCOPIC IMPRESSION: Early esophageal stricture-status post [facility] dilation  RECOMMENDATIONS: 1.  Continue current medication 2.  begin robinul 0.2mg  TID 3.  office visit 3-4 weeks REPEAT EXAM:  eSigned:  Inda Castle, MD 03/02/2013 10:23 AM Revised: 03/02/2013 10:23 AM  CC:

## 2013-03-02 NOTE — Progress Notes (Signed)
Called to room to assist during endoscopic procedure.  Patient ID and intended procedure confirmed with present staff. Received instructions for my participation in the procedure from the performing physician.  

## 2013-03-02 NOTE — Patient Instructions (Addendum)
Impressions/recommendations:  Early esophageal stricture post dilation (handouts given)  Continue current medications Begin robinul 0.2 mg three times a day. Please call to make an office visit for 3- 4 weeks with Dr. Deatra Ina.  YOU HAD AN ENDOSCOPIC PROCEDURE TODAY AT [facility]: Refer to the procedure report that was given to you for any specific questions about what was found during the examination.  If the procedure report does not answer your questions, please call your gastroenterologist to clarify.  If you requested that your care partner not be given the details of your procedure findings, then the procedure report has been included in a sealed envelope for you to review at your convenience later.  YOU SHOULD EXPECT: Some feelings of bloating in the abdomen. Passage of more gas than usual.  Walking can help get rid of the air that was put into your GI tract during the procedure and reduce the bloating. If you had a lower endoscopy (such as a colonoscopy or flexible sigmoidoscopy) you may notice spotting of blood in your stool or on the toilet paper. If you underwent a bowel prep for your procedure, then you may not have a normal bowel movement for a few days.  DIET: Your first meal following the procedure should be a light meal and then it is ok to progress to your normal diet.  A half-sandwich or bowl of soup is an example of a good first meal.  Heavy or fried foods are harder to digest and may make you feel nauseous or bloated.  Likewise meals heavy in dairy and vegetables can cause extra gas to form and this can also increase the bloating.  Drink plenty of fluids but you should avoid alcoholic beverages for 24 hours.  ACTIVITY: Your care partner should take you home directly after the procedure.  You should plan to take it easy, moving slowly for the rest of the day.  You can resume normal activity the day after the procedure however you should NOT DRIVE or use heavy  machinery for 24 hours (because of the sedation medicines used during the test).    SYMPTOMS TO REPORT IMMEDIATELY: A gastroenterologist can be reached at any hour.  During normal business hours, 8:30 AM to 5:00 PM Monday through Friday, call [phone].  After hours and on weekends, please call the GI answering service at [phone] who will take a message and have the physician on call contact you.   Following upper endoscopy (EGD)  Vomiting of blood or coffee ground material  New chest pain or pain under the shoulder blades  Painful or persistently difficult swallowing  New shortness of breath  Fever of 100F or higher  Black, tarry-looking stools  FOLLOW UP: If any biopsies were taken you will be contacted by phone or by letter within the next 1-3 weeks.  Call your gastroenterologist if you have not heard about the biopsies in 3 weeks.  Our staff will call the home number listed on your records the next business day following your procedure to check on you and address any questions or concerns that you may have at that time regarding the information given to you following your procedure. This is a courtesy call and so if there is no answer at the home number and we have not heard from you through the emergency physician on call, we will assume that you have returned to your regular daily activities without incident.  SIGNATURES/CONFIDENTIALITY: You and/or your care partner have signed  paperwork which will be entered into your electronic medical record.  These signatures attest to the fact that that the information above on your After Visit Summary has been reviewed and is understood.  Full responsibility of the confidentiality of this discharge information lies with you and/or your care-partner.

## 2013-03-03 ENCOUNTER — Telehealth: Payer: Self-pay | Admitting: *Deleted

## 2013-03-03 ENCOUNTER — Telehealth: Payer: Self-pay | Admitting: Gastroenterology

## 2013-03-03 NOTE — Telephone Encounter (Signed)
Patient called this am stating she followed diet instructions yesterday. Patient had two milkshakes which took two hours to finish related to "top of throat hurting'. Patient then had oatmeal last evening which took 1 1/2 hours to complete related to same discomfort. This am patient reports that she had soft cereal with milk which felt like it did not go down her throat. Patient stating she took her morning Methadone which took a long while to swallow. Finally able to swallow her pill but she felt like it had dissolved in her throat.  Patient denies chest pain, difficulty breathing or any pain in her throat. Patient inquiring whether she should continue to try to eat. Note forwarded to Dr. Deatra Ina.

## 2013-03-03 NOTE — Telephone Encounter (Signed)
Message left

## 2013-03-03 NOTE — Telephone Encounter (Signed)
Any throat discomfort related to her procedure should subside very quickly.  She can continue to try to eat.

## 2013-03-03 NOTE — Telephone Encounter (Signed)
Patient called to inform of Dr. Kelby Fam advice. Patient informed to try soft foods for now and to call with any difficulties or concerns. Patient to call with any fever, chest pain or continued problems.

## 2013-03-04 ENCOUNTER — Encounter: Payer: Self-pay | Admitting: Family Medicine

## 2013-03-04 ENCOUNTER — Telehealth: Payer: Self-pay | Admitting: Gastroenterology

## 2013-03-05 ENCOUNTER — Encounter: Payer: Self-pay | Admitting: Family Medicine

## 2013-03-05 MED ORDER — TOPIRAMATE 100 MG PO TABS: 100.0000 mg | ORAL_TABLET | Freq: Every day | ORAL | Status: DC

## 2013-03-05 NOTE — Telephone Encounter (Signed)
[phone] phone [phone] fax  Member ID [id number]

## 2013-03-05 NOTE — Telephone Encounter (Signed)
Spoke with [facility]  We have not received a denial letter  Got phone numbers from pt to [employer] P[employer]    [employer] to fax over form

## 2013-03-06 ENCOUNTER — Encounter: Payer: Self-pay | Admitting: Gastroenterology

## 2013-03-06 ENCOUNTER — Encounter: Payer: Medicare Other | Admitting: Gastroenterology

## 2013-03-10 ENCOUNTER — Encounter: Payer: Self-pay | Admitting: Gastroenterology

## 2013-03-10 ENCOUNTER — Telehealth: Payer: Self-pay | Admitting: Gastroenterology

## 2013-03-10 ENCOUNTER — Ambulatory Visit (INDEPENDENT_AMBULATORY_CARE_PROVIDER_SITE_OTHER): Payer: Medicare Other | Admitting: Gastroenterology

## 2013-03-10 VITALS — BP 100/60 | HR 85 | Ht 67.25 in | Wt 234.0 lb

## 2013-03-10 DIAGNOSIS — R05 Cough: Secondary | ICD-10-CM

## 2013-03-10 DIAGNOSIS — R059 Cough, unspecified: Secondary | ICD-10-CM

## 2013-03-10 DIAGNOSIS — K5900 Constipation, unspecified: Secondary | ICD-10-CM

## 2013-03-10 DIAGNOSIS — K219 Gastro-esophageal reflux disease without esophagitis: Secondary | ICD-10-CM

## 2013-03-10 DIAGNOSIS — R1011 Right upper quadrant pain: Secondary | ICD-10-CM

## 2013-03-10 MED ORDER — ESOMEPRAZOLE MAGNESIUM 40 MG PO CPDR: 40.0000 mg | DELAYED_RELEASE_CAPSULE | Freq: Two times a day (BID) | ORAL | Status: DC

## 2013-03-10 NOTE — Telephone Encounter (Signed)
Pt states she has been having increased problems with reflux and epigastric pain. Pt also c/o a cough especially at night. Pt scheduled to see Alonza Bogus PA today at 3:30pm. Pt aware of appt.

## 2013-03-10 NOTE — Patient Instructions (Signed)
Please keep appointment with Dr Deatra Ina on 03-30-2013 at 215 pm.  Please stop Pantoprazole and start Nexium twice daily.  We have sent the following medications to your pharmacy for you to pick up at your convenience: Nexium 40 mg, please take one capsule by mouth twice daily

## 2013-03-10 NOTE — Progress Notes (Signed)
03/10/2013 Karen Aguilar [id number] 1965/03/07   History of Present Illness:  This is a 48 year old female who is recently known to Dr. Deatra Ina and underwent EGD last week. She had an early esophageal stricture seen and that was dilated. She is on protonix 40 mg daily for complaints of acid reflux. At the time of her EGD she was placed on Robinul 2 mg 3 times daily for complaints of right upper quadrant abdominal pain. She is on several narcotics and other constipating medications, therefore is taking Linzess 145 mcg daily for that.  She comes in today with several complaints and hands me an entire paper filled out with all of her issues. She states that the acid reflux is still very severe despite the protonix twice a day. She is having acid come up her esophagus and causing foam in the back of her throat and her mouth. She is also having coughing again, which has been back for the last 4 days, mostly when she is lying down. She says that her constipation has been worse again despite the Linzess. She states when she first started that medication she was having a bowel movement every day, but now is only having one every 3-4 days and is having trouble going even then. She is complaining of bladder spasms and feeling like she can't empty her bladder. She states that she thinks the protonix is interfering with the Linzess and causing it not to work correctly. She said that her pain management physician said that there may be some interaction between protonix and baclofen that is causing the baclofen to be less effective and possibly causing her to have headaches.  She was initially started on the Robinul 3 times a day for 5 days, but then was told to decrease it to twice a day, which she is taking right now. This is for the pain that she is complaining of in her right upper quadrant. She says that the pain seemed to be better when she was taking this 3 times a day. She also complains of a lump in her right upper  quadrant and says that her PCP stated it may just be scar tissue. She's had multiple abdominal imagings including ultrasound, CT scan, and MRI. She does have a couple of hemangiomas on her liver, but otherwise no masses have been seen.   Current Medications, Allergies, Past Medical History, Past Surgical History, Family History and Social History were reviewed in [employer] record.   Physical Exam: BP 100/60  Pulse 85  Ht 5' 7.25" (1.708 m)  Wt 234 lb (106.142 kg)  BMI 36.38 kg/m2  LMP 02/18/2013 General: Well developed white female in no acute distress Head: Normocephalic and atraumatic Eyes:  Sclerae anicteric, conjunctiva pink  Ears: Normal auditory acuity Lungs: Clear throughout to auscultation Heart: Regular rate and rhythm Abdomen: Soft, non-distended.  Normal bowel sounds.  Non-tender.  Superficial abnormality felt in RUQ, likely representing a lipoma or scar tissue. Musculoskeletal: Symmetrical with no gross deformities  Extremities: No edema  Neurological: Alert oriented x 4, grossly non-focal Psychological:  Alert and cooperative. Normal mood and affect  Assessment and Recommendations: -48 year old female with several complaints. Complains of severe reflux causing a cough that is worsening despite protonix 40 mg twice daily. She also thinks that the protonix is interfering with the Linzess and her baclofen.  Also complaining of bladder spasms and problems emptying her bladder. I suggested that this may be secondary to the Robinul, but it she thinks  it may be from the Protonix as well. She is a poor historian and cannot determine the sequence of when her symptoms started or worsened in comparison to her getting new medications. I also stated that the Robinul may be worsening her constipation. She has chronic narcotic-induced constipation.  She also likely has gastroparesis that has been induced by narcotics as well, which could be worsening her reflux and  upper GI symptoms. We will start by changing her Protonix since she overall seems to be unhappy with this medication. Nexium, Dexilant, and Zegerid are all options for [place] to try. We'll start Nexium 40 mg twice daily.  She already has a followup appointment scheduled with Dr. Deatra Ina in approximately 4 weeks and she will keep that appointment.

## 2013-03-11 NOTE — Telephone Encounter (Signed)
Still no response from insurance.

## 2013-03-11 NOTE — Progress Notes (Signed)
Reviewed and agree with management. Eline Geng D. Rexton Greulich, M.D., FACG  

## 2013-03-12 ENCOUNTER — Other Ambulatory Visit: Payer: Self-pay

## 2013-03-12 ENCOUNTER — Telehealth: Payer: Self-pay

## 2013-03-12 MED ORDER — RANITIDINE HCL 300 MG PO TABS: 300.0000 mg | ORAL_TABLET | Freq: Two times a day (BID) | ORAL | Status: DC

## 2013-03-12 NOTE — Telephone Encounter (Signed)
D/c robinul, nexium Begin ranitidine 300mg  bid. C/b early next week

## 2013-03-12 NOTE — Telephone Encounter (Signed)
Pt aware.

## 2013-03-12 NOTE — Telephone Encounter (Signed)
No

## 2013-03-12 NOTE — Telephone Encounter (Signed)
Pt sent mychart message stating that her coughing persists, she has Nausea at times the Nexium is making me very drowsy, sleepy and feel weaker. Edema getting worse. States that constipation is still an issue and she has an extreme dry mouth. States she is still having trouble swallowing and medications and food feels like it is sitting in her throat near the bottom. Complains of drowsiness and muscle weakness.    Pt also wanted to know:  How long does it take for the side effects of the previous drug to leave my system, or is this now all the Nexium causing these issues?  I discussed with pt that drowsiness was not a usual side effect from the Nexium but she disagreed and states that it is listed on the papers from her pharmacy. In regards to her cough I also mentioned for her to keep her scheduled appt with the pulmonary doctor but pt states this is not a lung issue. Pt was seen by Alonza Bogus PA Tuesday.   Dr. Deatra Ina please advise.

## 2013-03-12 NOTE — Telephone Encounter (Signed)
Pt aware and script sent to pharmacy. Pt states that she has started to have edema again and thinks that it seems to be related to her constipation. Pt wanted to see if Dr. Deatra Ina thinks they may be related.

## 2013-03-16 ENCOUNTER — Encounter: Payer: Self-pay | Admitting: Gastroenterology

## 2013-03-18 ENCOUNTER — Ambulatory Visit (INDEPENDENT_AMBULATORY_CARE_PROVIDER_SITE_OTHER): Payer: Medicare Other | Admitting: Pulmonary Disease

## 2013-03-18 ENCOUNTER — Encounter: Payer: Self-pay | Admitting: Pulmonary Disease

## 2013-03-18 VITALS — BP 114/78 | HR 86 | Ht 67.5 in | Wt 231.0 lb

## 2013-03-18 DIAGNOSIS — R911 Solitary pulmonary nodule: Secondary | ICD-10-CM

## 2013-03-18 DIAGNOSIS — R05 Cough: Secondary | ICD-10-CM

## 2013-03-18 DIAGNOSIS — R059 Cough, unspecified: Secondary | ICD-10-CM

## 2013-03-18 DIAGNOSIS — R0602 Shortness of breath: Secondary | ICD-10-CM

## 2013-03-18 MED ORDER — BENZONATATE 100 MG PO CAPS: 100.0000 mg | ORAL_CAPSULE | Freq: Four times a day (QID) | ORAL | Status: DC | PRN

## 2013-03-18 NOTE — Patient Instructions (Signed)
We will request records from Dr. Laurelyn Sickle office for your pulmonary function testing Use the tessalon perles as needed for cough, try to suppress your cough Follow up with Dr. Deatra Ina Stay active and try to lose weight We will see you back in 2-3 months or sooner if needed

## 2013-03-18 NOTE — Progress Notes (Signed)
Subjective:    Patient ID: Karen Aguilar, female    DOB: 03-23-1965, 48 y.o.   MRN: [id number]  HPI  Karen Aguilar is here to see me because she had some nodules seen in her lung last spring.  She is concerned becaues she has a strong family history of lung cancer. She had the CT because she was experiencing R chest wall pain and had a CT chest looking for a PE.  There was no PE but she was found to have nodules. In the interim she had a cough starting in November.  She had another CT scan performed around this time which again demonstrated nodules (see below). She was treated with steroids and antibiotics.  She never really got better with this so she ended up going to an urgent care.  About this time she changed doctors to [facility] and ended up seeing Dr. Deatra Ina with  GI.  She was told that she had acid reflux and irritable bowel syndrome.    She notes that the coughs frequently and often coughs up "tiny bubbles" all the time.  She had an endoscopy which showed a stricture in her esophagus and she underwent a balloon dilation.    As a child and a young adult she never had breathing trouble.  She never had asthma or pneumonia.  She would occasionally get bronchitis over the years after buisness trips to areas with a lot of pollution.  She has actually been on disability for the last four years due to back pain.  During this time she has not developed bronchitis or trouble until this go round.     Past Medical History  Diagnosis Date  . PONV (postoperative nausea and vomiting)   . Hypothyroidism   . Anemia     etiology unknown-iron infusion in past,last 9/12  . Fibromyalgia   . Headache(784.0)     migraines  . Incontinence of urine     s/p sling procedure-unresolved  . GERD (gastroesophageal reflux disease)     rare zantac PRN  . Chronic pain     pain contract with Dr. Eartha Inch Pain  . Sleep apnea     mild-doesn't use cpap  . Depression with anxiety   . Arachnoiditis   .  Hyperlipidemia   . Migraines     and h/o optical migraine  . Vitamin D deficiency   . Iron deficiency     h/o anemia, s/p iron infusion (2012)  . DDD (degenerative disc disease), lumbar     s/p permanent nerve damage after back surgery  . DDD (degenerative disc disease), cervical   . Neuropathy of lower extremity 2011  . Hemorrhoid   . Anxiety and depression   . Arthritis   . Diverticulosis   . Gallstones   . IBS (irritable bowel syndrome)   . Kidney stones   . Pulmonary nodule 04/2012    7.56mm RLL nodule - resolved on imaging 12/2012  . Abnormal MRI scan, bone 2012    abnormal marrow signal humeral head, glenoid and scapula - diffuse replacement of fatty marrow - likely benign, no need for further investigation ([place])  . Migraines      Family History  Problem Relation Age of Onset  . Lung cancer Father 37     (smoker)  . Diabetes Brother   . Lung cancer Paternal Grandfather      (smoker)  . Colon cancer Paternal Grandmother   . Thyroid disease Mother   . Irritable bowel syndrome  Mother   . Dysphagia Mother   . Thyroid disease Cousin   . Cancer Other     unsure (ovarian/uterine)  . Breast cancer Maternal Aunt   . CAD Maternal Grandfather     MI  . Heart disease Maternal Grandfather   . Heart attack Maternal Grandfather   . Alzheimer's disease Maternal Grandmother   . Diabetes Daughter      History   Social History  . Marital Status: Married    Spouse Name: N/A    Number of Children: 2  . Years of Education: N/A   Occupational History  . disabled    Social History Main Topics  . Smoking status: Never Smoker   . Smokeless tobacco: Never Used  . Alcohol Use: No  . Drug Use: No  . Sexual Activity: Not Currently   Other Topics Concern  . Not on file   Social History Narrative   Lives with husband and 2 daughters (one in college)   Occupation: was Public affairs consultant   Disability since 2011 - arachnoiditis   Edu: associate's degree   Activity:  severe limitations 2/2 arachnoiditis and chronic pain   Diet: good water, fruits/vegetables daily     Allergies  Allergen Reactions  . Erythromycin Nausea And Vomiting  . Thimerosal     Local reaction-redness,swelling; veins popping  . Adhesive [Tape]     blisters  . Gabapentin Other (See Comments)    Feels like zombie  . Levothyroxine Hives  . Lyrica [Pregabalin] Other (See Comments)    Feels like zombie  . Azithromycin Rash  . Other Rash    MACROLIDE ANTIBIOTICS  . Sulfa Antibiotics Rash     Outpatient Prescriptions Prior to Visit  Medication Sig Dispense Refill  . Ascorbic Acid (VITAMIN C PO) Take 1 tablet by mouth as needed.       . baclofen (LIORESAL) 10 MG tablet Take 5 mg by mouth 3 (three) times daily.       Marland Kitchen escitalopram (LEXAPRO) 20 MG tablet Take 20 mg by mouth daily.       Marland Kitchen levothyroxine (SYNTHROID) 75 MCG tablet Take 75 mcg by mouth daily before breakfast. DAW; DAW; DAW; DAW      . Linaclotide (LINZESS) 145 MCG CAPS capsule Take 1 capsule (145 mcg total) by mouth daily.  30 capsule  1  . methadone (DOLOPHINE) 10 MG tablet Take 10 mg by mouth every 6 (six) hours.       Marland Kitchen morphine (MSIR) 15 MG tablet Take 1 tablet by mouth every 4 (four) hours as needed. 1-2 tablets every 4-6 hours      . ranitidine (ZANTAC) 300 MG tablet Take 1 tablet (300 mg total) by mouth 2 (two) times daily.  60 tablet  2  . thyroid (ARMOUR) 90 MG tablet Take 90 mg by mouth daily.       Marland Kitchen topiramate (TOPAMAX) 100 MG tablet Take 1 tablet (100 mg total) by mouth daily.  30 tablet  3  . VITAMIN D, CHOLECALCIFEROL, PO Take 10,000 Int'l Units by mouth as needed.       Marland Kitchen esomeprazole (NEXIUM) 40 MG capsule Take 1 capsule (40 mg total) by mouth 2 (two) times daily before a meal.  60 capsule  2  . glycopyrrolate (ROBINUL) 2 MG tablet Take one tab 3 times a day for 5 days then as needed  30 tablet  2   No facility-administered medications prior to visit.      Review  of Systems  Constitutional:  Positive for appetite change and unexpected weight change. Negative for fever.  HENT: Positive for congestion. Negative for dental problem, ear pain, nosebleeds, postnasal drip, rhinorrhea, sinus pressure, sneezing, sore throat and trouble swallowing.   Eyes: Negative for redness and itching.  Respiratory: Positive for cough, chest tightness and shortness of breath. Negative for wheezing.   Cardiovascular: Negative for palpitations and leg swelling.  Gastrointestinal: Negative for nausea and vomiting.  Genitourinary: Negative for dysuria.  Musculoskeletal: Positive for joint swelling.  Skin: Negative for rash.  Neurological: Positive for headaches.  Hematological: Does not bruise/bleed easily.  Psychiatric/Behavioral: Negative for dysphoric mood. The patient is nervous/anxious.        Objective:   Physical Exam Filed Vitals:   03/18/13 1644  BP: 114/78  Pulse: 86  Height: 5' 7.5" (1.715 m)  Weight: 231 lb (104.781 kg)  SpO2: 93%  Body mass index is 35.62 kg/(m^2).  Gen: morbidly obese, no acute distress HEENT: NCAT, PERRL, EOMi, OP clear, neck supple without masses PULM: CTA B CV: RRR, no mgr, no JVD AB: BS+, soft, nontender, no hsm Ext: warm, no edema, no clubbing, no cyanosis Derm: no rash or skin breakdown Neuro: A&Ox4, CN II-XII intact, MAEW  11/2012     Assessment & Plan:   Cough Karen Aguilar has many complaints today, one of which is cough.  I explained to her that her lung exam is normal and her recent CT chest showed no significant lung pathology.  The most likely explanation is her acid reflux.  Perhaps cyclical cough (cough causing laryngeal irritation and more cough) is contributing.    Plan: -continue follow up with GI -tessalon perles, voice rest encouraged  Solitary pulmonary nodule 11/2012 CT chest from [facility] showed a 2-43mm nodule in the RLL.  Given that she is not a smoker, this is extremely low risk and no further imaging is needed.  I tried to reassure her  today, but I suspect this will continue to cause some anxiety as she has unexplained right flank pain and a family history of lung cancer.   Shortness of breath Her oximetry with ambulation, lung exam, and recent CT chest were normal.  I suspect that she has some degree of restrictive lung disease due to obesity.  A recent cardiology evaluation was normal (including an echocardiogram).  She is not anemic.  So based on this the most likely explanation for her dyspnea is obesity and deconditioning.  I encouraged her to exercise regularly.  Obtain records of recent PFT.    Updated Medication List Outpatient Encounter Prescriptions as of 03/18/2013  Medication Sig  . Ascorbic Acid (VITAMIN C PO) Take 1 tablet by mouth as needed.   . baclofen (LIORESAL) 10 MG tablet Take 5 mg by mouth 3 (three) times daily.   Marland Kitchen escitalopram (LEXAPRO) 20 MG tablet Take 20 mg by mouth daily.   Marland Kitchen levothyroxine (SYNTHROID) 75 MCG tablet Take 75 mcg by mouth daily before breakfast. DAW; DAW; DAW; DAW  . Linaclotide (LINZESS) 145 MCG CAPS capsule Take 1 capsule (145 mcg total) by mouth daily.  . methadone (DOLOPHINE) 10 MG tablet Take 10 mg by mouth every 6 (six) hours.   Marland Kitchen morphine (MSIR) 15 MG tablet Take 1 tablet by mouth every 4 (four) hours as needed. 1-2 tablets every 4-6 hours  . ranitidine (ZANTAC) 300 MG tablet Take 1 tablet (300 mg total) by mouth 2 (two) times daily.  Marland Kitchen thyroid (ARMOUR) 90 MG tablet Take 90 mg  by mouth daily.   Marland Kitchen topiramate (TOPAMAX) 100 MG tablet Take 1 tablet (100 mg total) by mouth daily.  Marland Kitchen VITAMIN D, CHOLECALCIFEROL, PO Take 10,000 Int'l Units by mouth as needed.   . benzonatate (TESSALON) 100 MG capsule Take 1 capsule (100 mg total) by mouth every 6 (six) hours as needed for cough.  . [DISCONTINUED] esomeprazole (NEXIUM) 40 MG capsule Take 1 capsule (40 mg total) by mouth 2 (two) times daily before a meal.  . [DISCONTINUED] glycopyrrolate (ROBINUL) 2 MG tablet Take one tab 3 times a day  for 5 days then as needed

## 2013-03-18 NOTE — Telephone Encounter (Signed)
Patient approved for Linzess coverage until 03/06/2014 Letter sent down to be scanned in

## 2013-03-18 NOTE — Assessment & Plan Note (Addendum)
Her oximetry with ambulation, lung exam, and recent CT chest were normal.  I suspect that she has some degree of restrictive lung disease due to obesity.  A recent cardiology evaluation was normal (including an echocardiogram).  She is not anemic.  So based on this the most likely explanation for her dyspnea is obesity and deconditioning.  I encouraged her to exercise regularly.  Obtain records of recent PFT.

## 2013-03-18 NOTE — Assessment & Plan Note (Signed)
11/2012 CT chest from [facility] showed a 2-15mm nodule in the RLL.  Given that she is not a smoker, this is extremely low risk and no further imaging is needed.  I tried to reassure her today, but I suspect this will continue to cause some anxiety as she has unexplained right flank pain and a family history of lung cancer.

## 2013-03-18 NOTE — Assessment & Plan Note (Signed)
Karen Aguilar has many complaints today, one of which is cough.  I explained to her that her lung exam is normal and her recent CT chest showed no significant lung pathology.  The most likely explanation is her acid reflux.  Perhaps cyclical cough (cough causing laryngeal irritation and more cough) is contributing.    Plan: -continue follow up with GI -tessalon perles, voice rest encouraged

## 2013-03-19 ENCOUNTER — Encounter: Payer: Self-pay | Admitting: Family Medicine

## 2013-03-19 MED ORDER — CIPROFLOXACIN HCL 0.3 % OP SOLN: 2.0000 [drp] | OPHTHALMIC | Status: DC

## 2013-03-26 ENCOUNTER — Ambulatory Visit: Payer: Self-pay | Admitting: Family Medicine

## 2013-03-30 ENCOUNTER — Ambulatory Visit (INDEPENDENT_AMBULATORY_CARE_PROVIDER_SITE_OTHER): Payer: Medicare Other | Admitting: Gastroenterology

## 2013-03-30 ENCOUNTER — Encounter: Payer: Self-pay | Admitting: Gastroenterology

## 2013-03-30 VITALS — BP 100/74 | HR 80 | Ht 67.0 in | Wt 232.6 lb

## 2013-03-30 DIAGNOSIS — K219 Gastro-esophageal reflux disease without esophagitis: Secondary | ICD-10-CM

## 2013-03-30 DIAGNOSIS — R1013 Epigastric pain: Secondary | ICD-10-CM

## 2013-03-30 DIAGNOSIS — K5900 Constipation, unspecified: Secondary | ICD-10-CM

## 2013-03-30 NOTE — Assessment & Plan Note (Signed)
Symptoms are improved with Linzess  I suspect that she has pelvic floor dysfunction as well.

## 2013-03-30 NOTE — Assessment & Plan Note (Signed)
Patient continues to be symptomatic characterized by frequent regurgitation of gastric contents.  Gastroparesis may be contributing, especially in view of narcotic use.  Recommendations #1 gastric empty scan #2 gastroparesis diet #3 trial of dexilant 60 mg twice a day

## 2013-03-30 NOTE — Patient Instructions (Signed)
You have been scheduled for a gastric emptying scan at [facility] on 04/10/2013 at 1pm . Please arrive at least 15 minutes prior to your appointment for registration. Please make certain not to have anything to eat or drink after midnight the night before your test. Hold all stomach medications (ex: Zofran, phenergan, Reglan) 48 hours prior to your test. If you need to reschedule your appointment, please contact radiology scheduling at [phone]. _____________________________________________________________________ A gastric-emptying study measures how long it takes for food to move through your stomach. There are several ways to measure stomach emptying. In the most common test, you eat food that contains a small amount of radioactive material. A scanner that detects the movement of the radioactive material is placed over your abdomen to monitor the rate at which food leaves your stomach. This test normally takes about 2 hours to complete. _____________________________________________________________________   1st banding scheduled on 05/12/2013 at 2:15pm

## 2013-03-30 NOTE — Progress Notes (Signed)
          History of Present Illness:  The patient has returned for followup of reflux and constipation.  Reflux continues to be a severe problem.  She has very frequent regurgitation of gastric contents although no burning, per se.  She sleeps sitting up.  She's tried Nexium and Protonix without improvement.  She's currently taking Zantac.  She denies dysphagia, sore throat or hoarseness.  On daily Linzess constipation is much improved.  She's moving her bowels daily although she lacks a sense of rectal fullness and is unable to exert pressure which she relates to her multiple prior back surgeries.  Hemorrhoids are still a problem characterized by rectal discomfort and problems with hygiene.  This is despite relief from constipation.    Review of Systems: Pertinent positive and negative review of systems were noted in the above HPI section. All other review of systems were otherwise negative.    Current Medications, Allergies, Past Medical History, Past Surgical History, Family History and Social History were reviewed in [facility] record  Vital signs were reviewed in today's medical record. Physical Exam: General: Well developed , well nourished, no acute distress   See Assessment and Plan under Problem List

## 2013-04-10 ENCOUNTER — Encounter (HOSPITAL_COMMUNITY): Payer: Self-pay

## 2013-04-10 ENCOUNTER — Ambulatory Visit (HOSPITAL_COMMUNITY)
Admission: RE | Admit: 2013-04-10 | Discharge: 2013-04-10 | Disposition: A | Discharge status: Home / Self Care | Payer: Medicare Other | Source: Ambulatory Visit | Attending: Gastroenterology | Admitting: Gastroenterology

## 2013-04-10 DIAGNOSIS — R1013 Epigastric pain: Secondary | ICD-10-CM

## 2013-04-10 DIAGNOSIS — R109 Unspecified abdominal pain: Secondary | ICD-10-CM | POA: Insufficient documentation

## 2013-04-10 DIAGNOSIS — R112 Nausea with vomiting, unspecified: Secondary | ICD-10-CM | POA: Insufficient documentation

## 2013-04-10 DIAGNOSIS — K3189 Other diseases of stomach and duodenum: Secondary | ICD-10-CM | POA: Insufficient documentation

## 2013-04-10 MED ORDER — TECHNETIUM TC 99M SULFUR COLLOID
2.0000 | Freq: Once | INTRAVENOUS | Status: AC | PRN
Start: 2013-04-10 — End: 2013-04-10
  Administered 2013-04-10: 2 via ORAL

## 2013-04-14 ENCOUNTER — Other Ambulatory Visit: Payer: Self-pay

## 2013-04-14 ENCOUNTER — Encounter: Payer: Self-pay | Admitting: Gastroenterology

## 2013-04-14 MED ORDER — METOCLOPRAMIDE HCL 10 MG PO TABS: 10.0000 mg | ORAL_TABLET | Freq: Three times a day (TID) | ORAL | Status: DC

## 2013-04-14 NOTE — Telephone Encounter (Signed)
See result note from [place].

## 2013-04-15 ENCOUNTER — Encounter: Payer: Self-pay | Admitting: Gastroenterology

## 2013-04-15 ENCOUNTER — Other Ambulatory Visit: Payer: Self-pay

## 2013-04-15 ENCOUNTER — Other Ambulatory Visit: Payer: Self-pay | Admitting: Gastroenterology

## 2013-04-15 MED ORDER — DEXLANSOPRAZOLE 60 MG PO CPDR: 60.0000 mg | DELAYED_RELEASE_CAPSULE | Freq: Two times a day (BID) | ORAL | Status: DC

## 2013-04-20 ENCOUNTER — Telehealth: Payer: Self-pay | Admitting: *Deleted

## 2013-04-20 NOTE — Telephone Encounter (Signed)
dexilant for twice a day [id number]    mem num [id number]

## 2013-04-21 NOTE — Telephone Encounter (Signed)
They are faxing over today a quanity release for dexilant form to be filled out

## 2013-04-21 NOTE — Telephone Encounter (Signed)
Calling [employer] now 04/21/2013

## 2013-04-27 ENCOUNTER — Encounter: Payer: Self-pay | Admitting: Gastroenterology

## 2013-04-27 NOTE — Telephone Encounter (Signed)
Patient approved Letter sent down to be scanned in     Approved until  04/22/2014

## 2013-04-29 ENCOUNTER — Other Ambulatory Visit: Payer: Self-pay | Admitting: Gastroenterology

## 2013-05-01 ENCOUNTER — Telehealth: Payer: Self-pay | Admitting: *Deleted

## 2013-05-01 NOTE — Telephone Encounter (Signed)
Karen Aguilar is not working, its causing severe constipation  Patient is miserable  And Karen Aguilar  Is not working   Do you want to send Karen Aguilar Domperidone ??  And will domperidone cause constipation??      Patient is on Methadone and other pain meds , I explained to Karen Aguilar this would cause severe constipation

## 2013-05-04 ENCOUNTER — Ambulatory Visit (INDEPENDENT_AMBULATORY_CARE_PROVIDER_SITE_OTHER): Payer: Medicare Other | Admitting: Family Medicine

## 2013-05-04 ENCOUNTER — Encounter: Payer: Self-pay | Admitting: Family Medicine

## 2013-05-04 VITALS — BP 116/78 | HR 89 | Temp 98.0°F | Wt 229.8 lb

## 2013-05-04 DIAGNOSIS — K3184 Gastroparesis: Secondary | ICD-10-CM

## 2013-05-04 DIAGNOSIS — E039 Hypothyroidism, unspecified: Secondary | ICD-10-CM

## 2013-05-04 DIAGNOSIS — G8929 Other chronic pain: Secondary | ICD-10-CM

## 2013-05-04 NOTE — Assessment & Plan Note (Signed)
Found on recent gastric emptying scan.   Did not respond to reglan, unable to tolerate erythromycin, discussing domperidone.

## 2013-05-04 NOTE — Progress Notes (Signed)
BP 116/78  Pulse 89  Temp(Src) 98 F (36.7 C) (Oral)  Wt 229 lb 12 oz (104.214 kg)  SpO2 98%   CC: wants thyroid checked  Subjective:    Patient ID: Karen Aguilar, female    DOB: 09-Feb-1965, 48 y.o.   MRN: [id number]  HPI: Karen Aguilar is a 48 y.o. female presenting on 05/04/2013 for Hypothyroidism   Reviewed recent GI workup including abnormal gastric emptying study, narcotics thought contributing.  Did not respond to reglan, working on getting domperidone approved for her.  Next appt with GI 05/12/2013.  Currently taking dexilant 60mg  bid.  Constipation better with regular linzess use.  Chronic pain - saw Dr. Hardin Negus last week.  Methadone, morphine stopped.  Has been restarted on nucynta.  Requests thyroid checked.  Last check was 02/01/2013.  To see [facility] to discuss hysterectomy.  Local GYN - [facility].  Wt Readings from Last 3 Encounters:  05/04/13 229 lb 12 oz (104.214 kg)  03/30/13 232 lb 9.6 oz (105.507 kg)  03/18/13 231 lb (104.781 kg)  Body mass index is 35.98 kg/(m^2).   Past Medical History  Diagnosis Date  . PONV (postoperative nausea and vomiting)   . Hypothyroidism   . Anemia     etiology unknown-iron infusion in past,last 9/12  . Fibromyalgia   . Headache(784.0)     migraines  . Incontinence of urine     s/p sling procedure-unresolved  . GERD (gastroesophageal reflux disease)     rare zantac PRN  . Chronic pain     pain contract with Dr. Eartha Inch Pain  . Sleep apnea     mild-doesn't use cpap  . Depression with anxiety   . 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 Hyperlipidemia   . Migraines     and h/o optical migraine  . Vitamin D deficiency   . Iron deficiency     h/o anemia, s/p iron infusion (2012)  . DDD (degenerative disc disease), lumbar     s/p permanent nerve damage after back surgery  . DDD (degenerative disc disease), cervical   . Neuropathy of lower extremity 2011  . Hemorrhoid   . Anxiety and depression   .  Arthritis   . Diverticulosis   . Gallstones   . IBS (irritable bowel syndrome)   . Kidney stones   . Pulmonary nodule 04/2012    7.31mm RLL nodule - resolved on imaging 12/2012  . Abnormal MRI scan, bone 2012    abnormal marrow signal humeral head, glenoid and scapula - diffuse replacement of fatty marrow - likely benign, no need for further investigation ([place])  . Migraines     Relevant past medical, surgical, family and social history reviewed and updated as indicated.  Allergies and medications reviewed and updated. Current Outpatient Prescriptions on File Prior to Visit  Medication Sig  . dexlansoprazole (DEXILANT) 60 MG capsule Take 1 capsule (60 mg total) by mouth 2 (two) times daily.  Marland Kitchen escitalopram (LEXAPRO) 20 MG tablet Take 20 mg by mouth daily.   Marland Kitchen levothyroxine (SYNTHROID) 75 MCG tablet Take 75 mcg by mouth daily before breakfast. DAW; DAW; DAW; DAW  . LINZESS 145 MCG CAPS capsule TAKE ONE CAPSULE BY MOUTH EVERY DAY  . thyroid (ARMOUR) 90 MG tablet Take 90 mg by mouth daily.   Marland Kitchen topiramate (TOPAMAX) 100 MG tablet Take 1 tablet (100 mg total) by mouth daily.  . Ascorbic Acid (VITAMIN C PO) Take 1 tablet  by mouth as needed.   . benzonatate (TESSALON) 100 MG capsule Take 1 capsule (100 mg total) by mouth every 6 (six) hours as needed for cough.  Marland Kitchen VITAMIN D, CHOLECALCIFEROL, PO Take 10,000 Int'l Units by mouth as needed.    No current facility-administered medications on file prior to visit.    Review of Systems Per HPI unless specifically indicated above    Objective:    BP 116/78  Pulse 89  Temp(Src) 98 F (36.7 C) (Oral)  Wt 229 lb 12 oz (104.214 kg)  SpO2 98%  Physical Exam  Nursing note and vitals reviewed. Constitutional: She appears well-developed and well-nourished. No distress.  HENT:  Head: Normocephalic and atraumatic.  Mouth/Throat: Oropharynx is clear and moist. No oropharyngeal exudate.  Eyes: Conjunctivae and EOM are normal. Pupils are equal,  round, and reactive to light. No scleral icterus.  Neck: Normal range of motion. Neck supple. No thyromegaly present.  Cardiovascular: Normal rate, regular rhythm, normal heart sounds and intact distal pulses.   No murmur heard. Pulmonary/Chest: Effort normal and breath sounds normal. No respiratory distress. She has no wheezes. She has no rales.  Musculoskeletal: She exhibits no edema.  Lymphadenopathy:    She has no cervical adenopathy.  Skin: Skin is warm and dry. No rash noted.       Assessment & Plan:   Problem List Items Addressed This Visit   Hypothyroidism - Primary     Last check WNL, will return for rpt labwork (TSH, free T4, free T3) at end of month.    Relevant Orders      TSH      T4, Free      T3, Free   Chronic pain     Continue f/u with Dr. Hardin Negus, hopeful for improvement in GI sxs with discontinuation of methadone    Relevant Medications      Tapentadol HCl (NUCYNTA) 75 MG TABS   Gastroparesis     Found on recent gastric emptying scan.   Did not respond to reglan, unable to tolerate erythromycin, discussing domperidone.        Follow up plan: Return as needed.

## 2013-05-04 NOTE — Progress Notes (Signed)
Pre visit review using our clinic review tool, if applicable. No additional management support is needed unless otherwise documented below in the visit note. 

## 2013-05-04 NOTE — Assessment & Plan Note (Signed)
Continue f/u with Dr. Hardin Negus, hopeful for improvement in GI sxs with discontinuation of methadone

## 2013-05-04 NOTE — Patient Instructions (Signed)
Return at end of May for am blood work to check thyroid function panel. We will await to hear from GI about domperidone for gastric emptying study. Continue meds as up to now.   Hang in there!

## 2013-05-04 NOTE — Telephone Encounter (Signed)
Reglan is not causing constipation.  Her narcotics are.  She can try Linzess 290 microgram tablets for several days to see if that helps constipation.  Call back in about 4 days to report her progress.  On her reflux symptoms better with Reglan?  If not she can stop this.

## 2013-05-04 NOTE — Assessment & Plan Note (Signed)
Last check WNL, will return for rpt labwork (TSH, free T4, free T3) at end of month.

## 2013-05-05 DIAGNOSIS — G8929 Other chronic pain: Secondary | ICD-10-CM | POA: Insufficient documentation

## 2013-05-05 DIAGNOSIS — K594 Anal spasm: Secondary | ICD-10-CM | POA: Insufficient documentation

## 2013-05-05 DIAGNOSIS — R102 Pelvic and perineal pain: Secondary | ICD-10-CM

## 2013-05-08 MED ORDER — ONDANSETRON 4 MG PO TBDP: 4.0000 mg | ORAL_TABLET | Freq: Three times a day (TID) | ORAL | Status: DC | PRN

## 2013-05-08 MED ORDER — DIPHENOXYLATE-ATROPINE 2.5-0.025 MG PO TABS: 1.0000 | ORAL_TABLET | Freq: Four times a day (QID) | ORAL | Status: DC | PRN

## 2013-05-08 NOTE — Telephone Encounter (Signed)
ok 

## 2013-05-08 NOTE — Telephone Encounter (Signed)
Scripts sent in for Zofran 4mg  and lomotil. Pt aware.

## 2013-05-08 NOTE — Telephone Encounter (Signed)
We can add erythromycin 250 mg 4 times a day. She needs to give Linzess several days to see if it is effective for constipation.

## 2013-05-08 NOTE — Telephone Encounter (Signed)
Spoke with pt and she states she is has been switched off of her morpine and methadone and is now on neucenta. Pt reports she has been having nausea and diarrhea since this change was made. Encouraged pt to call her pain clinic md since this started after changing meds. Paperwork mailed to pt to start methadone. She needs to return that and then we can fax it along with script to pharmacy.  Dr. Deatra Ina can we prescribe something for nausea and perhaps some lomotil? Please advsie.

## 2013-05-08 NOTE — Telephone Encounter (Signed)
Dr. Deatra Ina per PCP note dated 05/04/13 the pt could not tolerate the erythromycin. Shirlean Mylar states that the pt stopped her pain meds and has not had diarrhea for a week.

## 2013-05-08 NOTE — Telephone Encounter (Signed)
Diarrhea for a week  Patient is having nausea and cant eat.... Reflux is severe and making her sick. Dexilant is not helping  Patient is miserable crying and very upset    It does not look like we sent in Domeridone for her.... She wants a call back today.. Dr Deatra Ina pleasae give message back to [name] will not be here this afternoon

## 2013-05-08 NOTE — Telephone Encounter (Signed)
Begin domperidone 10 mg one half hour a.c. and at bedtime

## 2013-05-12 ENCOUNTER — Encounter: Payer: Self-pay | Admitting: Gastroenterology

## 2013-05-12 ENCOUNTER — Encounter: Payer: Medicare Other | Admitting: Gastroenterology

## 2013-05-12 ENCOUNTER — Telehealth: Payer: Self-pay

## 2013-05-12 ENCOUNTER — Ambulatory Visit (INDEPENDENT_AMBULATORY_CARE_PROVIDER_SITE_OTHER): Payer: Medicare Other | Admitting: Gastroenterology

## 2013-05-12 ENCOUNTER — Telehealth: Payer: Self-pay | Admitting: Gastroenterology

## 2013-05-12 VITALS — BP 124/84 | HR 82 | Ht 67.0 in

## 2013-05-12 DIAGNOSIS — K5900 Constipation, unspecified: Secondary | ICD-10-CM

## 2013-05-12 DIAGNOSIS — K529 Noninfective gastroenteritis and colitis, unspecified: Secondary | ICD-10-CM | POA: Insufficient documentation

## 2013-05-12 DIAGNOSIS — K3184 Gastroparesis: Secondary | ICD-10-CM

## 2013-05-12 DIAGNOSIS — R197 Diarrhea, unspecified: Secondary | ICD-10-CM

## 2013-05-12 MED ORDER — ALPRAZOLAM 0.5 MG PO TABS: 0.5000 mg | ORAL_TABLET | Freq: Two times a day (BID) | ORAL | Status: DC | PRN

## 2013-05-12 NOTE — Assessment & Plan Note (Signed)
Gastroparesis certainly may be related to narcotic use.  She has not responded to promotility agents, however, nausea appears to be subsiding.

## 2013-05-12 NOTE — Assessment & Plan Note (Signed)
Now complaining of severe diarrhea

## 2013-05-12 NOTE — Patient Instructions (Signed)
Call back in a week to report your progress

## 2013-05-12 NOTE — Progress Notes (Signed)
          History of Present Illness:  Patient is not complaining of severe diarrhea consisting of postprandial watery stools.  Nausea has subsided.  She was switched to [facility] for chronic pain.  She stopped dexilant and has not had a recurrence of pyrosis.  Erythromycin and Reglan were not helpful.    Review of Systems: Pertinent positive and negative review of systems were noted in the above HPI section. All other review of systems were otherwise negative.    Current Medications, Allergies, Past Medical History, Past Surgical History, Family History and Social History were reviewed in [place] record  Vital signs were reviewed in today's medical record. Physical Exam: General: Well developed , well nourished, tearful   See Assessment and Plan under Problem List

## 2013-05-12 NOTE — Telephone Encounter (Signed)
Significant anxiety - ok to try xanax.  plz phone in and notify pt.

## 2013-05-12 NOTE — Assessment & Plan Note (Signed)
Diarrhea is probably multifactorial including drug affects, IBS and probably anxiety.  Pay medicines have been switched.  There may be some type of withdrawal symptoms.  She clearly is anxious.  Recommendations #1 hold further GI workup #2 patient has some Xanax at home and she was encouraged to try it #3 hold dexilant  Patient was instructed to call back to report her progress in one week

## 2013-05-12 NOTE — Telephone Encounter (Signed)
Pt states she is still having problems with lots of diarrhea and the lomotil is not helping. States she spoke with her pain clinic doctor and was told it was to early for her to be having withdrawal symptoms. Pt tearful on the phone again. Pt scheduled to see Dr. Deatra Ina today at 2:30pm. Pt aware of the appt.

## 2013-05-12 NOTE — Telephone Encounter (Signed)
Pt left v/m; pt was seen by GI doctor today due to diarrhea for 1 week; pt was advised to call PCP and ask for xanax for anxiety. Pt request cb. Karen Aguilar.

## 2013-05-13 NOTE — Telephone Encounter (Signed)
Rx called in as directed and patient notified.  

## 2013-05-21 ENCOUNTER — Encounter: Payer: Self-pay | Admitting: Gastroenterology

## 2013-05-22 ENCOUNTER — Other Ambulatory Visit: Payer: Self-pay | Admitting: Family Medicine

## 2013-05-22 NOTE — Telephone Encounter (Signed)
plz phone in. 

## 2013-05-22 NOTE — Telephone Encounter (Signed)
Rx called in as directed.   

## 2013-05-26 ENCOUNTER — Other Ambulatory Visit: Payer: Self-pay

## 2013-05-26 NOTE — Telephone Encounter (Signed)
Pt left v/m; pt has had diarrhea and has not had thyroid labs done yet but pt is our of thyroid med as well as lexapro. Pt request refills to [facility].Please advise.Pt request cb.

## 2013-05-26 NOTE — Telephone Encounter (Signed)
Also received fax from pharmacy requesting RF on Synthroid 0.075 mg saying she takes 1.5 tabs QD. We have her taking 1 QD. I refilled her Lexapro.

## 2013-05-27 ENCOUNTER — Telehealth: Payer: Self-pay | Admitting: Family Medicine

## 2013-05-27 MED ORDER — SYNTHROID 75 MCG PO TABS: 75.0000 ug | ORAL_TABLET | Freq: Every day | ORAL | Status: DC

## 2013-05-27 MED ORDER — ESCITALOPRAM OXALATE 20 MG PO TABS: 20.0000 mg | ORAL_TABLET | Freq: Every day | ORAL | Status: DC

## 2013-05-27 MED ORDER — THYROID 90 MG PO TABS: 90.0000 mg | ORAL_TABLET | Freq: Every day | ORAL | Status: DC

## 2013-05-27 NOTE — Telephone Encounter (Signed)
Refilled.  Synthroid 33mcg once daily. plz have her come in for thyroid labwork at her convenience.

## 2013-05-27 NOTE — Telephone Encounter (Signed)
Patient Information:  Caller Name: Chrysten  Phone: [phone]  Patient: Karen Aguilar, Karen Aguilar  Gender: Female  DOB: 08/02/1965  Age: 48 Years  PCP: Ria Bush [facility])  Pregnant: No  Office Follow Up:  Does the office need to follow up with this patient?: No  Instructions For The Office: N/A  RN Note:  Patient calling requesting Rx refills on Synthroid & Lexapro.  Per EPIC advised Rx's for both were sent to [facility] today and verified correct pharmacy.  Encouraged to call prn.  Symptoms  Reason For Call & Symptoms: following up regarding refills of Synthroid and Lexapro  Reviewed Health History In EMR: Yes  Reviewed Medications In EMR: Yes  Reviewed Allergies In EMR: Yes  Reviewed Surgeries / Procedures: Yes  Date of Onset of Symptoms: 05/27/2013 OB / GYN:  LMP: Unknown  Guideline(s) Used:  No Protocol Available - Information Only  Disposition Per Guideline:   Home Care  Reason For Disposition Reached:   Information only question and nurse able to answer  Advice Given:  N/A  Patient Will Follow Care Advice:  YES

## 2013-05-29 ENCOUNTER — Encounter: Payer: Self-pay | Admitting: Gastroenterology

## 2013-06-05 ENCOUNTER — Telehealth: Payer: Self-pay | Admitting: Gastroenterology

## 2013-06-05 ENCOUNTER — Ambulatory Visit (INDEPENDENT_AMBULATORY_CARE_PROVIDER_SITE_OTHER): Payer: Medicare Other | Admitting: Gastroenterology

## 2013-06-05 ENCOUNTER — Encounter: Payer: Self-pay | Admitting: Gastroenterology

## 2013-06-05 ENCOUNTER — Other Ambulatory Visit: Payer: Self-pay | Admitting: *Deleted

## 2013-06-05 VITALS — BP 98/60 | HR 88 | Ht 67.0 in | Wt 225.4 lb

## 2013-06-05 DIAGNOSIS — K3184 Gastroparesis: Secondary | ICD-10-CM

## 2013-06-05 DIAGNOSIS — K5900 Constipation, unspecified: Secondary | ICD-10-CM

## 2013-06-05 DIAGNOSIS — R1310 Dysphagia, unspecified: Secondary | ICD-10-CM

## 2013-06-05 MED ORDER — AMBULATORY NON FORMULARY MEDICATION: Status: DC

## 2013-06-05 MED ORDER — NALOXEGOL OXALATE 25 MG PO TABS: 1.0000 | ORAL_TABLET | Freq: Every morning | ORAL | Status: DC

## 2013-06-05 NOTE — Assessment & Plan Note (Signed)
Related to narcotics.  We'll hold domperidone pending response to [employer]

## 2013-06-05 NOTE — Patient Instructions (Signed)
You next follow up appointment is scheduled on 08/17/2013 at 2:45pm Your prescription is being sent to your pharmacy Call back in one week and let us know how you are doing

## 2013-06-05 NOTE — Assessment & Plan Note (Signed)
Patient has opioid-induced constipation and probable pelvic floor dysfunction as well.  Recommendations #1 DC Linzess; trial of movantik 25 mg daily.  Patient was warned about narcotic withdrawal symptoms including hyperhidrosis, chills, diarrhea, abdominal pain, anxiety and irritability.  She'll call back in one week to report her progress

## 2013-06-05 NOTE — Progress Notes (Signed)
          History of Present Illness:  After discontinuing Nucynta diarrhea entirely subsided and she is back with constipation.  She has a globus sensation and complains of dysgeusia.  An early esophageal stricture was dilated in March, 2015.  She remains very anxious about her medical condition including constipation.    Review of Systems: Pertinent positive and negative review of systems were noted in the above HPI section. All other review of systems were otherwise negative.    Current Medications, Allergies, Past Medical History, Past Surgical History, Family History and Social History were reviewed in [place] record  Vital signs were reviewed in today's medical record. Physical Exam: General: Well developed , well nourished, no acute distress   See Assessment and Plan under Problem List

## 2013-06-05 NOTE — Assessment & Plan Note (Signed)
Patient describes tightness when she swallows which I think is more a vague globus sensation and related to anxiety.

## 2013-06-08 NOTE — Telephone Encounter (Signed)
Received prior auth form today

## 2013-06-09 NOTE — Telephone Encounter (Signed)
Spoke with patient and lab appt scheduled. 

## 2013-06-10 ENCOUNTER — Encounter: Payer: Self-pay | Admitting: *Deleted

## 2013-06-10 ENCOUNTER — Other Ambulatory Visit (INDEPENDENT_AMBULATORY_CARE_PROVIDER_SITE_OTHER): Payer: Medicare Other

## 2013-06-10 DIAGNOSIS — E039 Hypothyroidism, unspecified: Secondary | ICD-10-CM

## 2013-06-10 LAB — TSH: TSH: 0.08 u[IU]/mL — ABNORMAL LOW (ref 0.35–4.50)

## 2013-06-10 LAB — T4, FREE: Free T4: 0.82 ng/dL (ref 0.60–1.60)

## 2013-06-10 LAB — T3, FREE: T3, Free: 3.8 pg/mL (ref 2.3–4.2)

## 2013-06-10 NOTE — Telephone Encounter (Signed)
Went to cover my meds and filled out request for medication  Waiting for a response

## 2013-06-11 ENCOUNTER — Other Ambulatory Visit: Payer: Self-pay | Admitting: Family Medicine

## 2013-06-11 ENCOUNTER — Encounter: Payer: Self-pay | Admitting: Family Medicine

## 2013-06-11 MED ORDER — SYNTHROID 75 MCG PO TABS: 75.0000 ug | ORAL_TABLET | Freq: Every day | ORAL | Status: DC

## 2013-06-12 ENCOUNTER — Telehealth: Payer: Self-pay | Admitting: Gastroenterology

## 2013-06-12 NOTE — Telephone Encounter (Signed)
Patient notified

## 2013-06-19 ENCOUNTER — Encounter: Payer: Self-pay | Admitting: Gastroenterology

## 2013-07-07 ENCOUNTER — Other Ambulatory Visit: Payer: Self-pay | Admitting: Family Medicine

## 2013-07-07 NOTE — Telephone Encounter (Signed)
Last filled 03/05/13 with 3 refills--please advise

## 2013-07-08 MED ORDER — ALPRAZOLAM 0.5 MG PO TABS: ORAL_TABLET | ORAL | Status: DC

## 2013-07-08 NOTE — Telephone Encounter (Signed)
Rx called in as directed.   

## 2013-07-08 NOTE — Telephone Encounter (Signed)
plz phone in. 

## 2013-07-09 ENCOUNTER — Telehealth: Payer: Self-pay

## 2013-07-09 NOTE — Telephone Encounter (Signed)
Sherry with [facility] Medicare left v/m; alprazolam prior Josem Kaufmann was approved for one year 07/08/13 - 07/09/2014. Approval letter to follow.

## 2013-07-09 NOTE — Telephone Encounter (Signed)
Pharmacy notified.

## 2013-07-10 ENCOUNTER — Encounter: Payer: Self-pay | Admitting: Family Medicine

## 2013-07-22 ENCOUNTER — Other Ambulatory Visit: Payer: Self-pay | Admitting: Family Medicine

## 2013-07-22 NOTE — Telephone Encounter (Signed)
plz phone in. 

## 2013-07-23 ENCOUNTER — Encounter: Payer: Self-pay | Admitting: Gastroenterology

## 2013-07-23 NOTE — Telephone Encounter (Signed)
Rx called in as directed.   

## 2013-07-27 ENCOUNTER — Other Ambulatory Visit: Payer: Self-pay | Admitting: Gastroenterology

## 2013-07-27 DIAGNOSIS — R079 Chest pain, unspecified: Secondary | ICD-10-CM

## 2013-07-27 MED ORDER — HYOSCYAMINE SULFATE 0.125 MG SL SUBL
0.2500 mg | SUBLINGUAL_TABLET | SUBLINGUAL | Status: DC | PRN
Start: 2013-07-27 — End: 2014-03-23

## 2013-08-17 ENCOUNTER — Encounter: Payer: Self-pay | Admitting: Gastroenterology

## 2013-08-17 ENCOUNTER — Ambulatory Visit (INDEPENDENT_AMBULATORY_CARE_PROVIDER_SITE_OTHER): Payer: Medicare Other | Admitting: Gastroenterology

## 2013-08-17 VITALS — BP 122/70 | HR 84 | Ht 67.0 in | Wt 218.4 lb

## 2013-08-17 DIAGNOSIS — K219 Gastro-esophageal reflux disease without esophagitis: Secondary | ICD-10-CM

## 2013-08-17 DIAGNOSIS — K3184 Gastroparesis: Secondary | ICD-10-CM

## 2013-08-17 DIAGNOSIS — K648 Other hemorrhoids: Secondary | ICD-10-CM

## 2013-08-17 NOTE — Assessment & Plan Note (Signed)
This is probably narcotic-related.

## 2013-08-17 NOTE — Assessment & Plan Note (Signed)
Patient has symptomatic hemorrhoids despite medical therapy

## 2013-08-17 NOTE — Assessment & Plan Note (Signed)
Symptoms are well controlled with twice a day dexilant.  Plan to continue with the same.

## 2013-08-17 NOTE — Patient Instructions (Signed)
HEMORRHOID BANDING PROCEDURE    FOLLOW-UP CARE   1. The procedure you have had should have been relatively painless since the banding of the area involved does not have nerve endings and there is no pain sensation.  The rubber band cuts off the blood supply to the hemorrhoid and the band may fall off as soon as 48 hours after the banding (the band may occasionally be seen in the toilet bowl following a bowel movement). You may notice a temporary feeling of fullness in the rectum which should respond adequately to plain Tylenol or Motrin.  2. Following the banding, avoid strenuous exercise that evening and resume full activity the next day.  A sitz bath (soaking in a warm tub) or bidet is soothing, and can be useful for cleansing the area after bowel movements.     3. To avoid constipation, take two tablespoons of natural wheat bran, natural oat bran, flax, Benefiber or any over the counter fiber supplement and increase your water intake to 7-8 glasses daily.    4. Unless you have been prescribed anorectal medication, do not put anything inside your rectum for two weeks: No suppositories, enemas, fingers, etc.  5. Occasionally, you may have more bleeding than usual after the banding procedure.  This is often from the untreated hemorrhoids rather than the treated one.  Don't be concerned if there is a tablespoon or so of blood.  If there is more blood than this, lie flat with your bottom higher than your head and apply an ice pack to the area. If the bleeding does not stop within a half an hour or if you feel faint, call our office at [phone] or go to the emergency room.  6. Problems are not common; however, if there is a substantial amount of bleeding, severe pain, chills, fever or difficulty passing urine (very rare) or other problems, you should call us at [phone] or report to the nearest emergency room.  7. Do not stay seated continuously for more than 2-3 hours for a day or two  after the procedure.  Tighten your buttock muscles 10-15 times every two hours and take 10-15 deep breaths every 1-2 hours.  Do not spend more than a few minutes on the toilet if you cannot empty your bowel; instead re-visit the toilet at a later time.   Your 2nd banding is scheduled on 10/08/2013 at 9:45am

## 2013-08-17 NOTE — Progress Notes (Signed)
      History of Present Illness:  Ms. Reinhold has started nycenta again and is having regular bowel movements.  Fortunately she has not had recurrence of diarrhea.  Constipation has resolved.  She complains of intermittent pyrosis which is fairly well-controlled with twice a day dexilant.  Her main complaint is hemorrhoidal discomfort.  She has both discomfort and difficulty cleaning herself.  Rarely has she seen minimal amounts of bleeding.  She is taking various topicals without relief.    Review of Systems: Pertinent positive and negative review of systems were noted in the above HPI section. All other review of systems were otherwise negative.    Current Medications, Allergies, Past Medical History, Past Surgical History, Family History and Social History were reviewed in [place] record  Vital signs were reviewed in today's medical record. Physical Exam: General: Well developed , well nourished, no acute distress On rectal exam there are no masses or external abnormalities  PROCEDURE NOTE: The patient presents with symptomatic grade *2**  hemorrhoids, requesting rubber band ligation of his/her hemorrhoidal disease.  All risks, benefits and alternative forms of therapy were described and informed consent was obtained.   The anorectum was pre-medicated with lubricant and nitroglycerine ointment The decision was made to band the *right posterior** internal hemorrhoid, and the [place] was used to perform band ligation without complication.  Digital anorectal examination was then performed to assure proper positioning of the band, and to adjust the banded tissue as required.  The patient was discharged home without pain or other issues.  Dietary and behavioral recommendations were given and along with follow-up instructions.    The patient will return in **2* weeks for  follow-up and possible additional banding as required. No complications were  encountered and the patient tolerated the procedure well.     See Assessment and Plan under Problem List

## 2013-09-03 ENCOUNTER — Other Ambulatory Visit: Payer: Self-pay | Admitting: Gastroenterology

## 2013-10-02 ENCOUNTER — Telehealth: Payer: Self-pay | Admitting: Pulmonary Disease

## 2013-10-02 NOTE — Telephone Encounter (Signed)
Recall cancelled.  Nothing further needed at this time.  Satira Anis

## 2013-10-08 ENCOUNTER — Encounter: Payer: Medicare Other | Admitting: Gastroenterology

## 2013-10-17 ENCOUNTER — Other Ambulatory Visit: Payer: Self-pay | Admitting: Gastroenterology

## 2013-10-19 ENCOUNTER — Encounter: Payer: Self-pay | Admitting: Family Medicine

## 2013-10-19 ENCOUNTER — Ambulatory Visit (INDEPENDENT_AMBULATORY_CARE_PROVIDER_SITE_OTHER): Payer: Medicare Other | Admitting: Family Medicine

## 2013-10-19 VITALS — BP 114/74 | HR 96 | Temp 98.6°F | Wt 221.0 lb

## 2013-10-19 DIAGNOSIS — J069 Acute upper respiratory infection, unspecified: Secondary | ICD-10-CM

## 2013-10-19 MED ORDER — AMOXICILLIN-POT CLAVULANATE 875-125 MG PO TABS: 1.0000 | ORAL_TABLET | Freq: Two times a day (BID) | ORAL | Status: DC

## 2013-10-19 NOTE — Patient Instructions (Signed)
Take augmentin, use nasal saline, drink as much fluid as possible and try to get some rest.  Take care.

## 2013-10-19 NOTE — Progress Notes (Signed)
Pre visit review using our clinic review tool, if applicable. No additional management support is needed unless otherwise documented below in the visit note.  H/o arachnoiditis at baseline.    HA for the last few weeks.  She initially didn't know if they were from her meds, migraines.  Then she noted facial pain and eye watering, cough.  No fevers but felt feverish, has hot sensations.  Ear ringing, more than normal.  Rhinorrhea, some.  More congestion.  Hot rags on her face helps some.  Some ST.  Some cough.  No sputum usually, but some occ sputum.  H/o GERD at throat irritation at baseline.    ROS: See HPI.  Otherwise negative.    Meds, vitals, and allergies reviewed.   GEN: nad, alert and oriented HEENT: mucous membranes moist, TM w/o erythema, nasal epithelium injected, OP with cobblestoning, max sinuses ttp NECK: supple w/o LA CV: rrr. PULM: ctab, no inc wob ABD: soft, +bs EXT: no edema

## 2013-10-21 DIAGNOSIS — J069 Acute upper respiratory infection, unspecified: Secondary | ICD-10-CM | POA: Insufficient documentation

## 2013-10-21 NOTE — Assessment & Plan Note (Signed)
D/w pt.  Sinusitis likely . Take augmentin, use nasal saline, drink as much fluid as possible and she'll try to get some rest.  Nontoxic.  F/u prn.  She agrees.

## 2013-10-23 ENCOUNTER — Ambulatory Visit: Payer: Self-pay | Admitting: Neurosurgery

## 2013-10-26 ENCOUNTER — Other Ambulatory Visit: Payer: Self-pay | Admitting: Family Medicine

## 2013-10-26 ENCOUNTER — Encounter: Payer: Self-pay | Admitting: Family Medicine

## 2013-11-02 ENCOUNTER — Encounter: Payer: Self-pay | Admitting: Family Medicine

## 2013-11-13 ENCOUNTER — Ambulatory Visit: Payer: Self-pay | Admitting: Anesthesiology

## 2013-11-20 ENCOUNTER — Other Ambulatory Visit: Payer: Self-pay | Admitting: Gastroenterology

## 2013-12-01 DIAGNOSIS — M1990 Unspecified osteoarthritis, unspecified site: Secondary | ICD-10-CM

## 2013-12-01 HISTORY — DX: Unspecified osteoarthritis, unspecified site: M19.90

## 2013-12-02 MED ORDER — LEVOFLOXACIN 500 MG PO TABS: 500.0000 mg | ORAL_TABLET | Freq: Every day | ORAL | Status: DC

## 2013-12-02 NOTE — Telephone Encounter (Signed)
augmentin caused rash and lip swelling/desquamation. Stopped prematurely. Ok to take levaquin course - sent to pharmacy and notified patient. Will need f/u office visit if recurrent symptoms.

## 2013-12-02 NOTE — Addendum Note (Signed)
Addended by: Ria Bush on: 12/02/2013 12:39 AM   Modules accepted: Orders

## 2013-12-09 ENCOUNTER — Ambulatory Visit (INDEPENDENT_AMBULATORY_CARE_PROVIDER_SITE_OTHER): Payer: Medicare Other | Admitting: Gastroenterology

## 2013-12-09 ENCOUNTER — Encounter: Payer: Self-pay | Admitting: Gastroenterology

## 2013-12-09 VITALS — BP 118/72 | HR 68 | Ht 67.5 in | Wt 215.8 lb

## 2013-12-09 DIAGNOSIS — K219 Gastro-esophageal reflux disease without esophagitis: Secondary | ICD-10-CM

## 2013-12-09 NOTE — Progress Notes (Signed)
Complaining of persistent pyrosis, especially at night.  She is taking dexilant twice a day, the latter before dinner.  Should she miss a dose she has severe pyrosis.  She has gastroparesis.  PROCEDURE NOTE: The patient presents with symptomatic grade *2**  hemorrhoids, requesting rubber band ligation of his/her hemorrhoidal disease.  All risks, benefits and alternative forms of therapy were described and informed consent was obtained.   The anorectum was pre-medicated with lubricant and nitroglycerine ointment The decision was made to band the **right anterior* internal hemorrhoid, and the [place] was used to perform band ligation without complication.  Digital anorectal examination was then performed to assure proper positioning of the band, and to adjust the banded tissue as required.  The patient was discharged home without pain or other issues.  Dietary and behavioral recommendations were given and along with follow-up instructions.    The patient will return in *2** weeks for  follow-up and possible additional banding as required. No complications were encountered and the patient tolerated the procedure well.

## 2013-12-09 NOTE — Assessment & Plan Note (Signed)
Patient is having break-through symptoms despite twice a day dexilant, especially at night.    Recommendations #1 trial of Zegerid daily at bedtime in lieu of the second dose of dexilant; if not improved I would consider gastroparesis with domperidone since this may be contributing to reflux symptoms

## 2013-12-09 NOTE — Patient Instructions (Addendum)
HEMORRHOID BANDING PROCEDURE    FOLLOW-UP CARE   1. The procedure you have had should have been relatively painless since the banding of the area involved does not have nerve endings and there is no pain sensation.  The rubber band cuts off the blood supply to the hemorrhoid and the band may fall off as soon as 48 hours after the banding (the band may occasionally be seen in the toilet bowl following a bowel movement). You may notice a temporary feeling of fullness in the rectum which should respond adequately to plain Tylenol or Motrin.  2. Following the banding, avoid strenuous exercise that evening and resume full activity the next day.  A sitz bath (soaking in a warm tub) or bidet is soothing, and can be useful for cleansing the area after bowel movements.     3. To avoid constipation, take two tablespoons of natural wheat bran, natural oat bran, flax, Benefiber or any over the counter fiber supplement and increase your water intake to 7-8 glasses daily.    4. Unless you have been prescribed anorectal medication, do not put anything inside your rectum for two weeks: No suppositories, enemas, fingers, etc.  5. Occasionally, you may have more bleeding than usual after the banding procedure.  This is often from the untreated hemorrhoids rather than the treated one.  Don't be concerned if there is a tablespoon or so of blood.  If there is more blood than this, lie flat with your bottom higher than your head and apply an ice pack to the area. If the bleeding does not stop within a half an hour or if you feel faint, call our office at [phone] or go to the emergency room.  6. Problems are not common; however, if there is a substantial amount of bleeding, severe pain, chills, fever or difficulty passing urine (very rare) or other problems, you should call us at [phone] or report to the nearest emergency room.  7. Do not stay seated continuously for more than 2-3 hours for a day or two  after the procedure.  Tighten your buttock muscles 10-15 times every two hours and take 10-15 deep breaths every 1-2 hours.  Do not spend more than a few minutes on the toilet if you cannot empty your bowel; instead re-visit the toilet at a later time.    We will put you on a cancellation list and will call you with a banding appointment.

## 2013-12-28 ENCOUNTER — Other Ambulatory Visit: Payer: Self-pay | Admitting: Gastroenterology

## 2014-01-04 ENCOUNTER — Encounter: Payer: Medicare Other | Admitting: Gastroenterology

## 2014-01-06 ENCOUNTER — Ambulatory Visit (INDEPENDENT_AMBULATORY_CARE_PROVIDER_SITE_OTHER): Payer: Medicare Other | Admitting: Gastroenterology

## 2014-01-06 ENCOUNTER — Encounter: Payer: Self-pay | Admitting: Gastroenterology

## 2014-01-06 ENCOUNTER — Telehealth: Payer: Self-pay | Admitting: Family Medicine

## 2014-01-06 VITALS — BP 118/72 | HR 68 | Ht 67.25 in | Wt 220.2 lb

## 2014-01-06 DIAGNOSIS — K648 Other hemorrhoids: Secondary | ICD-10-CM

## 2014-01-06 MED ORDER — OMEPRAZOLE-SODIUM BICARBONATE 40-1100 MG PO CAPS: 1.0000 | ORAL_CAPSULE | Freq: Every day | ORAL | Status: DC

## 2014-01-06 NOTE — Telephone Encounter (Signed)
Confirmed directions with Dorian Pod based on lab results.

## 2014-01-06 NOTE — Progress Notes (Signed)
PROCEDURE NOTE: The patient presents with symptomatic grade *2**  hemorrhoids, requesting rubber band ligation of his/her hemorrhoidal disease.  All risks, benefits and alternative forms of therapy were described and informed consent was obtained.   The anorectum was pre-medicated with lubricant and nitroglycerine ointment The decision was made to band the **left lateral* internal hemorrhoid, and the [place] was used to perform band ligation without complication.  Digital anorectal examination was then performed to assure proper positioning of the band, and to adjust the banded tissue as required.  The patient was discharged home without pain or other issues.  Dietary and behavioral recommendations were given and along with follow-up instructions.    The patient will return in *4** weeks for  follow-up and possible additional banding as required. No complications were encountered and the patient tolerated the procedure well.

## 2014-01-06 NOTE — Telephone Encounter (Signed)
Karen Aguilar with [facility] left v/m requesting clarification of synthroid 75 mcg DAW instructions taking one daily except one day a week take 1/2 tab.; pt had been getting synthroid 75 mcg taking one daily. Karen Aguilar request cb.

## 2014-01-06 NOTE — Patient Instructions (Signed)
HEMORRHOID BANDING PROCEDURE    FOLLOW-UP CARE   1. The procedure you have had should have been relatively painless since the banding of the area involved does not have nerve endings and there is no pain sensation.  The rubber band cuts off the blood supply to the hemorrhoid and the band may fall off as soon as 48 hours after the banding (the band may occasionally be seen in the toilet bowl following a bowel movement). You may notice a temporary feeling of fullness in the rectum which should respond adequately to plain Tylenol or Motrin.  2. Following the banding, avoid strenuous exercise that evening and resume full activity the next day.  A sitz bath (soaking in a warm tub) or bidet is soothing, and can be useful for cleansing the area after bowel movements.     3. To avoid constipation, take two tablespoons of natural wheat bran, natural oat bran, flax, Benefiber or any over the counter fiber supplement and increase your water intake to 7-8 glasses daily.    4. Unless you have been prescribed anorectal medication, do not put anything inside your rectum for two weeks: No suppositories, enemas, fingers, etc.  5. Occasionally, you may have more bleeding than usual after the banding procedure.  This is often from the untreated hemorrhoids rather than the treated one.  Don't be concerned if there is a tablespoon or so of blood.  If there is more blood than this, lie flat with your bottom higher than your head and apply an ice pack to the area. If the bleeding does not stop within a half an hour or if you feel faint, call our office at [phone] or go to the emergency room.  6. Problems are not common; however, if there is a substantial amount of bleeding, severe pain, chills, fever or difficulty passing urine (very rare) or other problems, you should call us at [phone] or report to the nearest emergency room.  7. Do not stay seated continuously for more than 2-3 hours for a day or two  after the procedure.  Tighten your buttock muscles 10-15 times every two hours and take 10-15 deep breaths every 1-2 hours.  Do not spend more than a few minutes on the toilet if you cannot empty your bowel; instead re-visit the toilet at a later time.   Your Follow up appointment with Dr Deatra Ina is scheduled on 02/23/2014 at 1:30pm

## 2014-01-08 ENCOUNTER — Encounter: Payer: Self-pay | Admitting: *Deleted

## 2014-01-08 ENCOUNTER — Ambulatory Visit (INDEPENDENT_AMBULATORY_CARE_PROVIDER_SITE_OTHER): Payer: Medicare Other | Admitting: Family Medicine

## 2014-01-08 ENCOUNTER — Telehealth: Payer: Self-pay

## 2014-01-08 VITALS — BP 112/74 | HR 84 | Temp 98.1°F | Wt 219.5 lb

## 2014-01-08 DIAGNOSIS — R300 Dysuria: Secondary | ICD-10-CM

## 2014-01-08 DIAGNOSIS — N390 Urinary tract infection, site not specified: Secondary | ICD-10-CM | POA: Insufficient documentation

## 2014-01-08 DIAGNOSIS — N3000 Acute cystitis without hematuria: Secondary | ICD-10-CM

## 2014-01-08 LAB — POCT URINALYSIS DIPSTICK
Bilirubin, UA: NEGATIVE
GLUCOSE UA: NEGATIVE
Ketones, UA: NEGATIVE
Nitrite, UA: NEGATIVE
Protein, UA: NEGATIVE
Spec Grav, UA: 1.015
Urobilinogen, UA: 0.2
pH, UA: 6

## 2014-01-08 MED ORDER — CIPROFLOXACIN HCL 500 MG PO TABS: 500.0000 mg | ORAL_TABLET | Freq: Two times a day (BID) | ORAL | Status: DC

## 2014-01-08 MED ORDER — ESCITALOPRAM OXALATE 10 MG PO TABS: 10.0000 mg | ORAL_TABLET | Freq: Every day | ORAL | Status: DC

## 2014-01-08 NOTE — Patient Instructions (Addendum)
You do have urine infection - treat with cipro twice daily for 3 days. Push fluids and rest. Let us know if not improving with this.  Urinary Tract Infection Urinary tract infections (UTIs) can develop anywhere along your urinary tract. Your urinary tract is your body's drainage system for removing wastes and extra water. Your urinary tract includes two kidneys, two ureters, a bladder, and a urethra. Your kidneys are a pair of bean-shaped organs. Each kidney is about the size of your fist. They are located below your ribs, one on each side of your spine. CAUSES Infections are caused by microbes, which are microscopic organisms, including fungi, viruses, and bacteria. These organisms are so small that they can only be seen through a microscope. Bacteria are the microbes that most commonly cause UTIs. SYMPTOMS  Symptoms of UTIs may vary by age and gender of the patient and by the location of the infection. Symptoms in young women typically include a frequent and intense urge to urinate and a painful, burning feeling in the bladder or urethra during urination. Older women and men are more likely to be tired, shaky, and weak and have muscle aches and abdominal pain. A fever may mean the infection is in your kidneys. Other symptoms of a kidney infection include pain in your back or sides below the ribs, nausea, and vomiting. DIAGNOSIS To diagnose a UTI, your caregiver will ask you about your symptoms. Your caregiver also will ask to provide a urine sample. The urine sample will be tested for bacteria and white blood cells. White blood cells are made by your body to help fight infection. TREATMENT  Typically, UTIs can be treated with medication. Because most UTIs are caused by a bacterial infection, they usually can be treated with the use of antibiotics. The choice of antibiotic and length of treatment depend on your symptoms and the type of bacteria causing your infection. HOME CARE INSTRUCTIONS  If you  were prescribed antibiotics, take them exactly as your caregiver instructs you. Finish the medication even if you feel better after you have only taken some of the medication.  Drink enough water and fluids to keep your urine clear or pale yellow.  Avoid caffeine, tea, and carbonated beverages. They tend to irritate your bladder.  Empty your bladder often. Avoid holding urine for long periods of time.  Empty your bladder before and after sexual intercourse.  After a bowel movement, women should cleanse from front to back. Use each tissue only once. SEEK MEDICAL CARE IF:   You have back pain.  You develop a fever.  Your symptoms do not begin to resolve within 3 days. SEEK IMMEDIATE MEDICAL CARE IF:   You have severe back pain or lower abdominal pain.  You develop chills.  You have nausea or vomiting.  You have continued burning or discomfort with urination. MAKE SURE YOU:   Understand these instructions.  Will watch your condition.  Will get help right away if you are not doing well or get worse. Document Released: 09/27/2004 Document Revised: 06/19/2011 Document Reviewed: 01/26/2011 [facility] Patient Information 2015 [place], [place]. This information is not intended to replace advice given to you by your health care provider. Make sure you discuss any questions you have with your health care provider.

## 2014-01-08 NOTE — Telephone Encounter (Signed)
Pt having pain upon urination for 3 -4 days but today pain has worsened with frequency of urine voiding small amts and pressure feeling. Pt taking OTC Cystex with no relief, no fever. Karen Aguilar. Dr G added pt to schedule today at 4:30. Pt voiced understanding.

## 2014-01-08 NOTE — Addendum Note (Signed)
Addended by: Ria Bush on: 01/08/2014 05:44 PM   Modules accepted: Miquel Dunn

## 2014-01-08 NOTE — Assessment & Plan Note (Signed)
UA and micro consistent with UTI - treat with 3d cipro 500mg  bid course Push fluids and rest. Update if not improving with treatment. UCx sent.

## 2014-01-08 NOTE — Progress Notes (Signed)
Pre visit review using our clinic review tool, if applicable. No additional management support is needed unless otherwise documented below in the visit note. 

## 2014-01-08 NOTE — Progress Notes (Addendum)
BP 112/74 mmHg  Pulse 84  Temp(Src) 98.1 F (36.7 C) (Oral)  Wt 219 lb 8 oz (99.565 kg)  LMP 12/05/2013   CC: UTI?  Subjective:    Patient ID: Karen Aguilar, female    DOB: 11/11/1965, 49 y.o.   MRN: [id number]  HPI: Karen Aguilar is a 49 y.o. female presenting on 01/08/2014 for Urinary Tract Infection   Over last several days noticing increased frequency and urgency. This morning noticed dysuria, incomplete emptying. Denies hematuria, fevers/chills, nausea, flank pain.   Self treated with cystex and cranberry juice. H/o bladder sling.   Last UTI unsure.  [facility] - Dr. Matilde Sprang - working on transfering to Dr Erlene Quan.  Recently dx with hip and knee OA.  Relevant past medical, surgical, family and social history reviewed and updated as indicated. Interim medical history since our last visit reviewed. Allergies and medications reviewed and updated. Current Outpatient Prescriptions on File Prior to Visit  Medication Sig  . ALPRAZolam (XANAX) 0.5 MG tablet TAKE 1 TABLET BY MOUTH THREE DAILY AS NEEDED.  . Ascorbic Acid (VITAMIN C PO) Take 1 tablet by mouth as needed.   Marland Kitchen DEXILANT 60 MG capsule TAKE ONE CAPSULE BY MOUTH TWICE DAILY  . hyoscyamine (LEVSIN SL) 0.125 MG SL tablet Place 2 tablets (0.25 mg total) under the tongue every 4 (four) hours as needed.  . Misc Natural Products (TART CHERRY ADVANCED PO) Take by mouth daily. Pt takes [employer], 2 tablespoons, once a day for osteoarthritis  . Nutritional Supplements (NUTRITIONAL SUPPLEMENT PO) Take 1 tablet by mouth daily. Pt takes Cartalast Supplement, 1 tablet, orally, every night for osteoarthritis  . omeprazole-sodium bicarbonate (ZEGERID) 40-1100 MG per capsule Take 1 capsule by mouth at bedtime.  Marland Kitchen SYNTHROID 75 MCG tablet Take 1 tablet (75 mcg total) by mouth daily before breakfast. But take 1/2 tablet once a week.  . Tapentadol HCl (NUCYNTA) 100 MG TABS Take 1 tablet by mouth every 4 (four) hours.  Marland Kitchen  thyroid (ARMOUR) 90 MG tablet Take 1 tablet (90 mg total) by mouth daily.  Marland Kitchen topiramate (TOPAMAX) 100 MG tablet TAKE 1 TABLET BY MOUTH EVERY DAY   No current facility-administered medications on file prior to visit.    Review of Systems Per HPI unless specifically indicated above     Objective:    BP 112/74 mmHg  Pulse 84  Temp(Src) 98.1 F (36.7 C) (Oral)  Wt 219 lb 8 oz (99.565 kg)  LMP 12/05/2013  Wt Readings from Last 3 Encounters:  01/08/14 219 lb 8 oz (99.565 kg)  01/06/14 220 lb 4 oz (99.905 kg)  12/09/13 215 lb 12.8 oz (97.886 kg)    Physical Exam  Constitutional: She appears well-developed and well-nourished. No distress.  HENT:  Mouth/Throat: Oropharynx is clear and moist. No oropharyngeal exudate.  Abdominal: Soft. Bowel sounds are normal. She exhibits no distension and no mass. There is no tenderness. There is no rigidity, no rebound, no guarding, no CVA tenderness and negative Murphy's sign.  Musculoskeletal: She exhibits no edema.  Psychiatric: She has a normal mood and affect.  Nursing note and vitals reviewed.  Results for orders placed or performed in visit on 01/08/14  POCT Urinalysis Dipstick  Result Value Ref Range   Color, UA Light Yellow    Clarity, UA Hazy    Glucose, UA Negative    Bilirubin, UA Negative    Ketones, UA Negative    Spec Grav, UA 1.015    Blood,  UA Trace    pH, UA 6.0    Protein, UA Negative    Urobilinogen, UA 0.2    Nitrite, UA Negative    Leukocytes, UA moderate (2+)       Assessment & Plan:   Problem List Items Addressed This Visit    UTI (urinary tract infection) - Primary    UA and micro consistent with UTI - treat with 3d cipro 500mg  bid course Push fluids and rest. Update if not improving with treatment. UCx sent.    Relevant Orders      POCT Urinalysis Dipstick (Completed)      Urine culture       Follow up plan: Return if symptoms worsen or fail to improve.

## 2014-01-10 LAB — URINE CULTURE

## 2014-01-12 ENCOUNTER — Other Ambulatory Visit: Payer: Self-pay | Admitting: Family Medicine

## 2014-01-12 MED ORDER — CEPHALEXIN 500 MG PO CAPS: 500.0000 mg | ORAL_CAPSULE | Freq: Two times a day (BID) | ORAL | Status: DC

## 2014-01-20 ENCOUNTER — Encounter: Payer: Self-pay | Admitting: Family Medicine

## 2014-01-20 ENCOUNTER — Telehealth: Payer: Self-pay | Admitting: *Deleted

## 2014-01-20 NOTE — Telephone Encounter (Signed)
PA for name brand Synthroid in your IN box for completion.

## 2014-01-21 NOTE — Telephone Encounter (Signed)
Filled and in Kim's box. 

## 2014-01-21 NOTE — Telephone Encounter (Signed)
Form faxed

## 2014-01-26 ENCOUNTER — Encounter: Payer: Self-pay | Admitting: Family Medicine

## 2014-01-29 ENCOUNTER — Other Ambulatory Visit: Payer: Self-pay | Admitting: Family Medicine

## 2014-01-29 NOTE — Telephone Encounter (Signed)
PA approved. Pharmacy notified 

## 2014-02-05 ENCOUNTER — Other Ambulatory Visit: Payer: Self-pay | Admitting: Gastroenterology

## 2014-02-08 ENCOUNTER — Encounter: Payer: Self-pay | Admitting: Gastroenterology

## 2014-02-15 ENCOUNTER — Encounter: Payer: Self-pay | Admitting: Family Medicine

## 2014-02-15 ENCOUNTER — Ambulatory Visit (INDEPENDENT_AMBULATORY_CARE_PROVIDER_SITE_OTHER): Payer: Medicare Other | Admitting: Family Medicine

## 2014-02-15 VITALS — BP 114/68 | HR 86 | Temp 98.2°F | Wt 218.8 lb

## 2014-02-15 DIAGNOSIS — J209 Acute bronchitis, unspecified: Secondary | ICD-10-CM

## 2014-02-15 MED ORDER — DOXYCYCLINE HYCLATE 100 MG PO CAPS: 100.0000 mg | ORAL_CAPSULE | Freq: Two times a day (BID) | ORAL | Status: DC

## 2014-02-15 MED ORDER — BENZONATATE 100 MG PO CAPS: 100.0000 mg | ORAL_CAPSULE | Freq: Three times a day (TID) | ORAL | Status: DC | PRN

## 2014-02-15 NOTE — Patient Instructions (Signed)
I think you have a viral bronchitis -treat with plenty of fluids and rest. Try tessalon perls for cough - swallow, don't chew.  Try over the counter delsym or dimetapp (cough syrup) with guaifenesin and dextromethorphan for cough at night time. Doxycycline antibiotic to hold on to in case fever >101, worsening productive cough or not improving over next few days as expected.

## 2014-02-15 NOTE — Progress Notes (Signed)
Pre visit review using our clinic review tool, if applicable. No additional management support is needed unless otherwise documented below in the visit note. 

## 2014-02-15 NOTE — Progress Notes (Signed)
BP 114/68 mmHg  Pulse 86  Temp(Src) 98.2 F (36.8 C) (Oral)  Wt 218 lb 12 oz (99.224 kg)  SpO2 97%  LMP 01/30/2014   CC: cough  Subjective:    Patient ID: Karen Aguilar, female    DOB: 31-Mar-1965, 49 y.o.   MRN: [id number]  HPI: Karen Aguilar is a 49 y.o. female presenting on 02/15/2014 for Cough   5d h/o cold sxs, yesterday with significant productive cough. Feverish and chills as well.  Bilateral ear pain/tinnitus. + PNDrainage, initially with ST. Chest congestion. Mild dypsnea with cough.   No tooth pain, abd pain, headaches. No wheezing.   Mother sick at home.  No smokers at home.  So far taking theraflu. Taking vit C tablets and zinc tablets. Drinking plenty of water.   Relevant past medical, surgical, family and social history reviewed and updated as indicated. Interim medical history since our last visit reviewed. Allergies and medications reviewed and updated. Current Outpatient Prescriptions on File Prior to Visit  Medication Sig  . ALPRAZolam (XANAX) 0.5 MG tablet TAKE 1 TABLET BY MOUTH THREE DAILY AS NEEDED.  . Ascorbic Acid (VITAMIN C PO) Take 1 tablet by mouth as needed.   Marland Kitchen DEXILANT 60 MG capsule TAKE 1 CAPSULE BY MOUTH TWICE DAILY  . escitalopram (LEXAPRO) 10 MG tablet Take 1 tablet (10 mg total) by mouth daily.  . hyoscyamine (LEVSIN SL) 0.125 MG SL tablet Place 2 tablets (0.25 mg total) under the tongue every 4 (four) hours as needed.  . Misc Natural Products (TART CHERRY ADVANCED PO) Take by mouth daily. Pt takes [employer], 2 tablespoons, once a day for osteoarthritis  . Nutritional Supplements (NUTRITIONAL SUPPLEMENT PO) Take 1 tablet by mouth daily. Pt takes Cartalast Supplement, 1 tablet, orally, every night for osteoarthritis  . SYNTHROID 75 MCG tablet Take 1 tablet (75 mcg total) by mouth daily before breakfast. But take 1/2 tablet once a week.  . Tapentadol HCl (NUCYNTA) 100 MG TABS Take 1 tablet by mouth every 4 (four) hours.  Marland Kitchen thyroid  (ARMOUR) 90 MG tablet Take 1 tablet (90 mg total) by mouth daily.  Marland Kitchen topiramate (TOPAMAX) 100 MG tablet TAKE 1 TABLET BY MOUTH EVERY DAY   No current facility-administered medications on file prior to visit.    Review of Systems Per HPI unless specifically indicated above     Objective:    BP 114/68 mmHg  Pulse 86  Temp(Src) 98.2 F (36.8 C) (Oral)  Wt 218 lb 12 oz (99.224 kg)  SpO2 97%  LMP 01/30/2014  Wt Readings from Last 3 Encounters:  02/15/14 218 lb 12 oz (99.224 kg)  01/08/14 219 lb 8 oz (99.565 kg)  01/06/14 220 lb 4 oz (99.905 kg)    Physical Exam  Constitutional: She appears well-developed and well-nourished. No distress.  HENT:  Head: Normocephalic and atraumatic.  Right Ear: Hearing, tympanic membrane, external ear and ear canal normal.  Left Ear: Hearing, external ear and ear canal normal.  Nose: No mucosal edema or rhinorrhea. Right sinus exhibits no maxillary sinus tenderness and no frontal sinus tenderness. Left sinus exhibits no maxillary sinus tenderness and no frontal sinus tenderness.  Mouth/Throat: Uvula is midline, oropharynx is clear and moist and mucous membranes are normal. No oropharyngeal exudate, posterior oropharyngeal edema, posterior oropharyngeal erythema or tonsillar abscesses.  Some congestion behind L TM  Eyes: Conjunctivae and EOM are normal. Pupils are equal, round, and reactive to light. No scleral icterus.  Neck: Normal range  of motion. Neck supple.  Cardiovascular: Normal rate, regular rhythm, normal heart sounds and intact distal pulses.   No murmur heard. Pulmonary/Chest: Effort normal and breath sounds normal. No respiratory distress. She has no wheezes. She has no rales.  Harsh cough present  Lymphadenopathy:    She has no cervical adenopathy.  Skin: Skin is warm and dry. No rash noted.  Nursing note and vitals reviewed.     Assessment & Plan:   Problem List Items Addressed This Visit    Acute bronchitis - Primary     Anticipate viral given short duration. Discussed supportive care with fluids, rest, prescribed tessalon perls and recommended OTC dimetapp or delsym. If not improving as expected, or if fever >101, may fill doxy course. Multiple med intolerances and relative contraindications - macrolide, sulfa, augmentin and erythromycin allergies documented as well as intolerance to NSAIDs and narcotics 2/2 gastroparesis (and also on pain contract with Dr Hardin Negus, currently on nucynta).          Follow up plan: Return if symptoms worsen or fail to improve.

## 2014-02-15 NOTE — Assessment & Plan Note (Signed)
Anticipate viral given short duration. Discussed supportive care with fluids, rest, prescribed tessalon perls and recommended OTC dimetapp or delsym. If not improving as expected, or if fever >101, may fill doxy course. Multiple med intolerances and relative contraindications - macrolide, sulfa, augmentin and erythromycin allergies documented as well as intolerance to NSAIDs and narcotics 2/2 gastroparesis (and also on pain contract with Dr Hardin Negus, currently on nucynta).

## 2014-02-17 ENCOUNTER — Other Ambulatory Visit: Payer: Self-pay | Admitting: Family Medicine

## 2014-02-18 ENCOUNTER — Other Ambulatory Visit: Payer: Self-pay | Admitting: Gastroenterology

## 2014-02-19 ENCOUNTER — Other Ambulatory Visit: Payer: Self-pay | Admitting: *Deleted

## 2014-02-19 MED ORDER — DEXLANSOPRAZOLE 60 MG PO CPDR: 1.0000 | DELAYED_RELEASE_CAPSULE | Freq: Two times a day (BID) | ORAL | Status: DC

## 2014-02-23 ENCOUNTER — Encounter: Payer: Self-pay | Admitting: Gastroenterology

## 2014-02-23 ENCOUNTER — Ambulatory Visit (INDEPENDENT_AMBULATORY_CARE_PROVIDER_SITE_OTHER): Payer: Medicare Other | Admitting: Gastroenterology

## 2014-02-23 VITALS — BP 110/74 | HR 68 | Resp 14 | Ht 67.25 in | Wt 217.4 lb

## 2014-02-23 DIAGNOSIS — K589 Irritable bowel syndrome without diarrhea: Secondary | ICD-10-CM | POA: Insufficient documentation

## 2014-02-23 DIAGNOSIS — K648 Other hemorrhoids: Secondary | ICD-10-CM

## 2014-02-23 NOTE — Assessment & Plan Note (Signed)
His severe urgency with poorly formed stool may be a variant of irritable bowel.  Anxiety and polypharmacy may also be contributing factors.  I suspect that symptoms are also exacerbating hemorrhoidal discomfort.  Recommendations #1 trial of Viberzi 100 mg twice a day

## 2014-02-23 NOTE — Assessment & Plan Note (Signed)
She continues symptomatic despite band ligation.  Symptoms are probably exacerbated by IBS diarrhea .  Lantus hold further therapy until stools are under better control

## 2014-02-23 NOTE — Patient Instructions (Signed)
Your follow up appointment with Dr Deatra Ina is scheduled on 04/06/2014 at 2:45pm

## 2014-02-23 NOTE — Progress Notes (Signed)
      History of Present Illness:  Karen Aguilar continues to complain of hemorrhoids consisting of painful protrusion, bowel movement.  Main complaint is extreme urgency.  She has at least 2 poorly formed bowel movements a day.  She is completed band ligation.  Without dexilant she has severe pyrosis.    Review of Systems: Pertinent positive and negative review of systems were noted in the above HPI section. All other review of systems were otherwise negative.    Current Medications, Allergies, Past Medical History, Past Surgical History, Family History and Social History were reviewed in [place] record  Vital signs were reviewed in today's medical record. Physical Exam: General: Well developed , well nourished, no acute distress   See Assessment and Plan under Problem List

## 2014-03-02 ENCOUNTER — Encounter: Payer: Self-pay | Admitting: Family Medicine

## 2014-03-02 ENCOUNTER — Ambulatory Visit (INDEPENDENT_AMBULATORY_CARE_PROVIDER_SITE_OTHER): Payer: Medicare Other | Admitting: Family Medicine

## 2014-03-02 VITALS — BP 102/68 | HR 80 | Temp 98.1°F | Wt 215.2 lb

## 2014-03-02 DIAGNOSIS — N3000 Acute cystitis without hematuria: Secondary | ICD-10-CM | POA: Insufficient documentation

## 2014-03-02 DIAGNOSIS — L989 Disorder of the skin and subcutaneous tissue, unspecified: Secondary | ICD-10-CM

## 2014-03-02 LAB — POCT URINALYSIS DIPSTICK
Bilirubin, UA: NEGATIVE
Blood, UA: NEGATIVE
GLUCOSE UA: NEGATIVE
Ketones, UA: NEGATIVE
NITRITE UA: NEGATIVE
PROTEIN UA: NEGATIVE
Spec Grav, UA: 1.015
Urobilinogen, UA: 0.2
pH, UA: 6

## 2014-03-02 LAB — HM MAMMOGRAPHY: HM Mammogram: NORMAL

## 2014-03-02 LAB — HM PAP SMEAR: HM Pap smear: NORMAL

## 2014-03-02 MED ORDER — NITROFURANTOIN MONOHYD MACRO 100 MG PO CAPS: 100.0000 mg | ORAL_CAPSULE | Freq: Two times a day (BID) | ORAL | Status: DC

## 2014-03-02 NOTE — Progress Notes (Addendum)
BP 102/68 mmHg  Pulse 80  Temp(Src) 98.1 F (36.7 C) (Oral)  Wt 215 lb 4 oz (97.637 kg)  LMP 02/21/2014   CC: UTI?  Subjective:    Patient ID: Karen Aguilar, female    DOB: 1965/02/12, 49 y.o.   MRN: [id number]  HPI: Karen Aguilar is a 49 y.o. female presenting on 03/02/2014 for Urinary Tract Infection   Recently seen for acute cystitis 01/18/2014 and treated with 3d cipro 500mg  course. UCx returned 45k GBS. Did have persistent sxs so extended to keflex 7d course. Then seen for acute bronchitis 02/15/2014, thought viral however. Didn't end up filling doxy WASP.   Endorses significant frequency over last week, treated at home with cystex which may have helped some, but then yesterday had urinary retention. She took azo and the was able to void. Suprapubic cramping, urgency. Some nausea this morning.   Denies fevers/chills, dysuria, vomiting or flank pain. No blood in urine.   Currently with migraine as well.   Lab Results  Component Value Date   CREATININE 0.79 02/01/2013   Also with rash on bilateral elbows and starting on left knee. Rash present for years (5+), pruritic but not tender. Bumps on elbows.   Relevant past medical, surgical, family and social history reviewed and updated as indicated. Interim medical history since our last visit reviewed. Allergies and medications reviewed and updated. Current Outpatient Prescriptions on File Prior to Visit  Medication Sig  . ALPRAZolam (XANAX) 0.5 MG tablet TAKE 1 TABLET BY MOUTH THREE DAILY AS NEEDED.  Marland Kitchen ARMOUR THYROID 90 MG tablet TAKE ONE TABLET BY MOUTH EVERY DAY  . Ascorbic Acid (VITAMIN C PO) Take 1 tablet by mouth as needed.   Marland Kitchen dexlansoprazole (DEXILANT) 60 MG capsule Take 1 capsule (60 mg total) by mouth 2 (two) times daily.  Marland Kitchen dronabinol (MARINOL) 2.5 MG capsule as needed.   Marland Kitchen escitalopram (LEXAPRO) 10 MG tablet Take 1 tablet (10 mg total) by mouth daily.  . hyoscyamine (LEVSIN SL) 0.125 MG SL tablet Place 2 tablets  (0.25 mg total) under the tongue every 4 (four) hours as needed.  . Misc Natural Products (TART CHERRY ADVANCED PO) Take by mouth daily. Pt takes [employer], 2 tablespoons, once a day for osteoarthritis  . Nutritional Supplements (NUTRITIONAL SUPPLEMENT PO) Take 1 tablet by mouth daily. Pt takes Cartalast Supplement, 1 tablet, orally, every night for osteoarthritis  . SYNTHROID 75 MCG tablet Take 1 tablet (75 mcg total) by mouth daily before breakfast. But take 1/2 tablet once a week.  . Tapentadol HCl (NUCYNTA) 100 MG TABS Take 1 tablet by mouth every 4 (four) hours.  . topiramate (TOPAMAX) 100 MG tablet TAKE 1 TABLET BY MOUTH EVERY DAY   No current facility-administered medications on file prior to visit.   Past Medical History  Diagnosis Date  . PONV (postoperative nausea and vomiting)   . Hypothyroidism   . Anemia     etiology unknown-iron infusion in past,last 9/12  . Fibromyalgia   . Headache(784.0)     migraines  . Incontinence of urine     s/p sling procedure-unresolved  . GERD (gastroesophageal reflux disease)     rare zantac PRN  . Chronic pain     pain contract with Dr. Eartha Inch Pain  . Sleep apnea     mild-doesn't use cpap  . Depression with anxiety   . 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 Hyperlipidemia   . Migraines  and h/o optical migraine  . Vitamin D deficiency   . Iron deficiency     h/o anemia, s/p iron infusion (2012)  . DDD (degenerative disc disease), lumbar     s/p permanent nerve damage after back surgery  . DDD (degenerative disc disease), cervical   . Neuropathy of lower extremity 2011  . Hemorrhoid   . Anxiety and depression   . Arthritis   . Diverticulosis   . Gallstones   . IBS (irritable bowel syndrome)   . Kidney stones   . Pulmonary nodule 04/2012    7.24mm RLL nodule - resolved on imaging 12/2012  . Abnormal MRI scan, bone 2012    abnormal marrow signal humeral head, glenoid and scapula - diffuse replacement of  fatty marrow - likely benign, no need for further investigation ([place])  . Migraines   . Osteoarthritis 12/2013  . GERD (gastroesophageal reflux disease)     Review of Systems Per HPI unless specifically indicated above     Objective:    BP 102/68 mmHg  Pulse 80  Temp(Src) 98.1 F (36.7 C) (Oral)  Wt 215 lb 4 oz (97.637 kg)  LMP 02/21/2014  Wt Readings from Last 3 Encounters:  03/02/14 215 lb 4 oz (97.637 kg)  02/23/14 217 lb 6.4 oz (98.612 kg)  02/15/14 218 lb 12 oz (99.224 kg)    Physical Exam  Constitutional: She appears well-developed and well-nourished. No distress.  Abdominal: Soft. Normal appearance and bowel sounds are normal. She exhibits no distension and no mass. There is no hepatosplenomegaly. There is tenderness (mild pressure) in the suprapubic area. There is no rigidity, no rebound, no guarding, no CVA tenderness and negative Murphy's sign.  Skin: Skin is warm and dry. Rash noted.  Skin lesions bilateral elbows L>R and L knee consisting of small rough flesh colored papules, some with central umbilication  Psychiatric: She has a normal mood and affect.  Nursing note and vitals reviewed.  Results for orders placed or performed in visit on 03/02/14  POCT Urinalysis Dipstick  Result Value Ref Range   Color, UA Yellow    Clarity, UA Clear    Glucose, UA Negative    Bilirubin, UA Negative    Ketones, UA Negative    Spec Grav, UA 1.015    Blood, UA Negative    pH, UA 6.0    Protein, UA Negative    Urobilinogen, UA 0.2    Nitrite, UA Negative    Leukocytes, UA Trace       Assessment & Plan:  I have asked patient to return for fasting labs and afterwards for CPE Problem List Items Addressed This Visit    Skin lesions    Anticipate benign cause like molluscum, possible warts. Pt desires further evaluation - will refer to derm. Pt will continue moisturizing regularly      Relevant Orders   Ambulatory referral to Dermatology   Acute cystitis without  hematuria - Primary    UA/micro suspicious for UTI - treat with macrobid 7d course.  UCx sent. Discussed avoiding bladder irritants, avoiding holding urine, and ensuring good water intake. Micro: WBC 5-10, RBC rare, Bact 1+, casts none, epi 1-5      Relevant Orders   Urine culture   POCT Urinalysis Dipstick (Completed)       Follow up plan: Return if symptoms worsen or fail to improve.

## 2014-03-02 NOTE — Patient Instructions (Addendum)
Return in next few months for a physical. For urine - looks like infection, treat with macrobid 7d course. Push fluids and rest  Urinary Tract Infection Urinary tract infections (UTIs) can develop anywhere along your urinary tract. Your urinary tract is your body's drainage system for removing wastes and extra water. Your urinary tract includes two kidneys, two ureters, a bladder, and a urethra. Your kidneys are a pair of bean-shaped organs. Each kidney is about the size of your fist. They are located below your ribs, one on each side of your spine. CAUSES Infections are caused by microbes, which are microscopic organisms, including fungi, viruses, and bacteria. These organisms are so small that they can only be seen through a microscope. Bacteria are the microbes that most commonly cause UTIs. SYMPTOMS  Symptoms of UTIs may vary by age and gender of the patient and by the location of the infection. Symptoms in young women typically include a frequent and intense urge to urinate and a painful, burning feeling in the bladder or urethra during urination. Older women and men are more likely to be tired, shaky, and weak and have muscle aches and abdominal pain. A fever may mean the infection is in your kidneys. Other symptoms of a kidney infection include pain in your back or sides below the ribs, nausea, and vomiting. DIAGNOSIS To diagnose a UTI, your caregiver will ask you about your symptoms. Your caregiver also will ask to provide a urine sample. The urine sample will be tested for bacteria and white blood cells. White blood cells are made by your body to help fight infection. TREATMENT  Typically, UTIs can be treated with medication. Because most UTIs are caused by a bacterial infection, they usually can be treated with the use of antibiotics. The choice of antibiotic and length of treatment depend on your symptoms and the type of bacteria causing your infection. HOME CARE INSTRUCTIONS  If you were  prescribed antibiotics, take them exactly as your caregiver instructs you. Finish the medication even if you feel better after you have only taken some of the medication.  Drink enough water and fluids to keep your urine clear or pale yellow.  Avoid caffeine, tea, and carbonated beverages. They tend to irritate your bladder.  Empty your bladder often. Avoid holding urine for long periods of time.  Empty your bladder before and after sexual intercourse.  After a bowel movement, women should cleanse from front to back. Use each tissue only once. SEEK MEDICAL CARE IF:   You have back pain.  You develop a fever.  Your symptoms do not begin to resolve within 3 days. SEEK IMMEDIATE MEDICAL CARE IF:   You have severe back pain or lower abdominal pain.  You develop chills.  You have nausea or vomiting.  You have continued burning or discomfort with urination. MAKE SURE YOU:   Understand these instructions.  Will watch your condition.  Will get help right away if you are not doing well or get worse. Document Released: 09/27/2004 Document Revised: 06/19/2011 Document Reviewed: 01/26/2011 [facility] Patient Information 2015 [place], [place]. This information is not intended to replace advice given to you by your health care provider. Make sure you discuss any questions you have with your health care provider.

## 2014-03-02 NOTE — Assessment & Plan Note (Addendum)
UA/micro suspicious for UTI - treat with macrobid 7d course.  UCx sent. Discussed avoiding bladder irritants, avoiding holding urine, and ensuring good water intake. Micro: WBC 5-10, RBC rare, Bact 1+, casts none, epi 1-5

## 2014-03-02 NOTE — Progress Notes (Signed)
Pre visit review using our clinic review tool, if applicable. No additional management support is needed unless otherwise documented below in the visit note. 

## 2014-03-02 NOTE — Addendum Note (Signed)
Addended by: Ria Bush on: 03/02/2014 05:39 PM   Modules accepted: Miquel Dunn

## 2014-03-02 NOTE — Addendum Note (Signed)
Addended by: Royann Shivers A on: 03/02/2014 02:33 PM   Modules accepted: Orders

## 2014-03-03 DIAGNOSIS — L989 Disorder of the skin and subcutaneous tissue, unspecified: Secondary | ICD-10-CM | POA: Insufficient documentation

## 2014-03-03 NOTE — Assessment & Plan Note (Addendum)
Anticipate benign cause like molluscum, possible warts. Pt desires further evaluation - will refer to derm. Pt will continue moisturizing regularly

## 2014-03-03 NOTE — Addendum Note (Signed)
Addended by: Ria Bush on: 03/03/2014 07:16 AM   Modules accepted: Orders, SmartSet

## 2014-03-04 LAB — URINE CULTURE: Colony Count: >=100000

## 2014-03-06 ENCOUNTER — Encounter: Payer: Self-pay | Admitting: Family Medicine

## 2014-03-06 DIAGNOSIS — N3000 Acute cystitis without hematuria: Secondary | ICD-10-CM

## 2014-03-06 DIAGNOSIS — Z960 Presence of urogenital implants: Secondary | ICD-10-CM

## 2014-03-06 DIAGNOSIS — N3289 Other specified disorders of bladder: Secondary | ICD-10-CM

## 2014-03-16 ENCOUNTER — Ambulatory Visit: Payer: Self-pay | Admitting: Urology

## 2014-03-23 ENCOUNTER — Other Ambulatory Visit: Payer: Self-pay | Admitting: Gastroenterology

## 2014-04-06 ENCOUNTER — Encounter: Payer: Self-pay | Admitting: Physician Assistant

## 2014-04-06 ENCOUNTER — Ambulatory Visit (INDEPENDENT_AMBULATORY_CARE_PROVIDER_SITE_OTHER): Payer: Medicare Other | Admitting: Physician Assistant

## 2014-04-06 VITALS — BP 118/60 | HR 100 | Ht 67.25 in | Wt 210.2 lb

## 2014-04-06 DIAGNOSIS — K589 Irritable bowel syndrome without diarrhea: Secondary | ICD-10-CM | POA: Diagnosis not present

## 2014-04-06 DIAGNOSIS — K3184 Gastroparesis: Secondary | ICD-10-CM | POA: Diagnosis not present

## 2014-04-06 DIAGNOSIS — K219 Gastro-esophageal reflux disease without esophagitis: Secondary | ICD-10-CM

## 2014-04-06 NOTE — Progress Notes (Signed)
Patient ID: KAMIE KORBER, female   DOB: [birth year], 49 y.o.   MRN: [id number]   Subjective:    Patient ID: IDELLE REIMANN, female    DOB: 1965-11-04, 49 y.o.   MRN: [id number]  HPI  Hedwig is a pleasant 49 year old white female known to Dr. Deatra Ina. She has been followed for IBS, internal hemorrhoids for which she has undergone banding on 3 separate occasions. She also has diagnosis of gastroparesis with 70% retention at 2 hours on prior gastric emptying scan and is status post cholecystectomy. She requires high-dose PPI therapy for chronic GERD and is on  Dexilant twice daily. She came in today for appointment with Dr. Micah Flesher schedule had changed and she inadvertently did not have her appointment moved. She had been started on the burning as he in February 2016 with complaints of 2 loose bowel movements per day. However when she started on the rib urgency she developed bladder spasms and stopped it. She is not currently having any problems with hemorrhoidal symptoms. She actually says her bowel movements have been fairly regular however over the past month or so after taking antibiotics for UTIs she had developed some mild constipation. She takes Peri-Colace which works well on a when necessary basis. Versed not to be on any more medications if she can help it. She mentions that her appetite is generally poor and she just doesn't get hungry but she always on chronic narcotics and Topamax. Her weight has been stable. She is not having any problems with nausea or vomiting and no regular abdominal pain. She has been on Topamax for years, says she's been having a lot of problems with migraines recently despite the Topamax.    Review of Systems Pertinent positive and negative review of systems were noted in the above HPI section.  All other review of systems was otherwise negative.  Outpatient Encounter Prescriptions as of 04/06/2014  Medication Sig  . ALPRAZolam (XANAX) 0.5 MG tablet TAKE 1 TABLET BY  MOUTH THREE DAILY AS NEEDED.  Marland Kitchen ARMOUR THYROID 90 MG tablet TAKE ONE TABLET BY MOUTH EVERY DAY  . Ascorbic Acid (VITAMIN C PO) Take 1 tablet by mouth as needed.   Marland Kitchen dexlansoprazole (DEXILANT) 60 MG capsule Take 1 capsule (60 mg total) by mouth 2 (two) times daily.  Marland Kitchen dronabinol (MARINOL) 2.5 MG capsule as needed.   Marland Kitchen escitalopram (LEXAPRO) 10 MG tablet Take 1 tablet (10 mg total) by mouth daily.  . hyoscyamine (LEVSIN SL) 0.125 MG SL tablet PLACE 2 TABLETS UNDER THE TONGUE EVERY 4 HOURS AS NEEDED  . Misc Natural Products (TART CHERRY ADVANCED PO) Take by mouth daily. Pt takes [employer], 2 tablespoons, once a day for osteoarthritis  . Nutritional Supplements (NUTRITIONAL SUPPLEMENT PO) Take 1 tablet by mouth daily. Pt takes Cartalast Supplement, 1 tablet, orally, every night for osteoarthritis  . SYNTHROID 75 MCG tablet Take 1 tablet (75 mcg total) by mouth daily before breakfast. But take 1/2 tablet once a week.  . Tapentadol HCl (NUCYNTA) 100 MG TABS Take 1 tablet by mouth every 4 (four) hours.  . topiramate (TOPAMAX) 100 MG tablet TAKE 1 TABLET BY MOUTH EVERY DAY  . [DISCONTINUED] nitrofurantoin, macrocrystal-monohydrate, (MACROBID) 100 MG capsule Take 1 capsule (100 mg total) by mouth 2 (two) times daily. (Patient not taking: Reported on 04/06/2014)   Allergies  Allergen Reactions  . Erythromycin Nausea And Vomiting  . Thimerosal     Local reaction-redness,swelling; veins popping  . Adhesive [Tape]  blisters  . Augmentin [Amoxicillin-Pot Clavulanate]     Lip swelling and irritation.   . Gabapentin Other (See Comments)    Feels like zombie  . Influenza Vaccines Hives  . Levothyroxine Hives  . Lyrica [Pregabalin] Other (See Comments)    Feels like zombie  . Azithromycin Rash  . Macrolides And Ketolides Rash  . Sulfa Antibiotics Rash   Patient Active Problem List   Diagnosis Date Noted  . Skin lesions 03/03/2014  . Acute cystitis without hematuria 03/02/2014  . Irritable  bowel syndrome 02/23/2014  . Acute bronchitis 02/15/2014  . Chest pain 07/27/2013  . Dysphagia, unspecified(787.20) 06/05/2013  . Diarrhea 05/12/2013  . Gastroparesis 05/04/2013  . Solitary pulmonary nodule 03/18/2013  . Leg edema 02/13/2013  . RUQ abdominal pain 02/10/2013  . Abdominal pain, right upper quadrant 02/10/2013  . Unspecified constipation 02/10/2013  . Internal hemorrhoids 02/10/2013  . Hypothyroidism   . Fibromyalgia   . GERD (gastroesophageal reflux disease)   . Chronic pain   . Sleep apnea   . Depression with anxiety   . Arachnoiditis   . Hyperlipidemia   . Migraines   . Vitamin D deficiency   . Iron deficiency   . DDD (degenerative disc disease), lumbar    History   Social History  . Marital Status: Married    Spouse Name: N/A  . Number of Children: 2  . Years of Education: N/A   Occupational History  . disabled    Social History Main Topics  . Smoking status: Never Smoker   . Smokeless tobacco: Never Used  . Alcohol Use: No  . Drug Use: No  . Sexual Activity: Not Currently   Other Topics Concern  . Not on file   Social History Narrative   Lives with husband and 2 daughters (one in college)   Occupation: was Public affairs consultant   Disability since 2011 - arachnoiditis   Edu: associate's degree   Activity: severe limitations 2/2 arachnoiditis and chronic pain   Diet: good water, fruits/vegetables daily    Ms. Rhee's family history includes Alzheimer's disease in her maternal grandmother; Breast cancer in her maternal aunt; CAD in her maternal grandfather; Cancer in her other; Colon cancer in her paternal grandmother; Diabetes in her brother and daughter; Dysphagia in her mother; Heart attack in her maternal grandfather; Heart disease in her maternal grandfather; Irritable bowel syndrome in her mother; Lung cancer in her paternal grandfather; Lung cancer (age of onset: 16) in her father; Thyroid disease in her cousin and mother.        Objective:    Filed Vitals:   04/06/14 1522  BP: 118/60  Pulse: 100    Physical Exam  well-developed white female in no acute distress, somewhat tearful at the beginning of the evaluation, anxious blood pressure 118/60 pulse 100 height 5 foot 7 weight 210. HEENT; nontraumatic, normocephalic, EOMI PERRLA sclera anicteric,-not further examined today discussion only       Assessment & Plan:   #1 49 yo female with IBS/alternating bowel habits #2 Bladder spasms  With Viberzi-stopped #3 Chronic GERD #4 gastroparesis- likely narcotic induced #5 hx if internal hemorrhoids- s/p banding x 3 sessions  Plan;  Add daily probiotic Leave off Viberzi Advised liberal water intake and Peri-Colace when necessary or MiraLAX when necessary for constipation. We discussed the likelihood that a lot of her GI symptoms are secondary to her medications, including twice a day Which may be contributing to diarrhea and her lack of appetite  is likely a secondary effect of chronic narcotics and Topamax. Since migraines are very poorly controlled advice she be seen at the headache clinic for reevaluation     Alfredia Ferguson PA-C 04/06/2014   Cc: Ria Bush, MD

## 2014-04-06 NOTE — Progress Notes (Signed)
Reviewed and agree with management. Jessee Newnam D. Zeola Brys, M.D., FACG  

## 2014-04-06 NOTE — Patient Instructions (Addendum)
Start a probiotic Align, take 1 capsule daily. We have given you a coupon. Stay off [place].   Get appointment with headache clinic.   Follow up with Dr. Erskine Emery as needed.

## 2014-04-29 ENCOUNTER — Other Ambulatory Visit: Payer: Self-pay | Admitting: Family Medicine

## 2014-06-02 ENCOUNTER — Other Ambulatory Visit: Payer: Self-pay | Admitting: Family Medicine

## 2014-06-11 ENCOUNTER — Other Ambulatory Visit: Payer: Self-pay | Admitting: Neurosurgery

## 2014-06-11 DIAGNOSIS — M5126 Other intervertebral disc displacement, lumbar region: Secondary | ICD-10-CM

## 2014-06-11 DIAGNOSIS — M542 Cervicalgia: Secondary | ICD-10-CM

## 2014-06-15 ENCOUNTER — Ambulatory Visit: Payer: Medicare Other

## 2014-06-17 ENCOUNTER — Ambulatory Visit
Admission: RE | Admit: 2014-06-17 | Discharge: 2014-06-17 | Disposition: A | Discharge status: Home / Self Care | Payer: Medicare Other | Source: Ambulatory Visit | Attending: Neurosurgery | Admitting: Neurosurgery

## 2014-06-17 DIAGNOSIS — M542 Cervicalgia: Secondary | ICD-10-CM | POA: Diagnosis not present

## 2014-06-17 DIAGNOSIS — M5126 Other intervertebral disc displacement, lumbar region: Secondary | ICD-10-CM | POA: Diagnosis present

## 2014-07-01 ENCOUNTER — Other Ambulatory Visit: Payer: Self-pay | Admitting: Family Medicine

## 2014-07-02 ENCOUNTER — Other Ambulatory Visit: Payer: Self-pay | Admitting: Family Medicine

## 2014-07-02 NOTE — Telephone Encounter (Signed)
t called to verify refill for thyroid med had been done. Advised refill had been done and pt will ck with pharmacy.

## 2014-07-15 ENCOUNTER — Emergency Department
Admission: EM | Admit: 2014-07-15 | Discharge: 2014-07-15 | Disposition: A | Discharge status: Home / Self Care | Payer: Medicare Other | Attending: Emergency Medicine | Admitting: Emergency Medicine

## 2014-07-15 ENCOUNTER — Ambulatory Visit (INDEPENDENT_AMBULATORY_CARE_PROVIDER_SITE_OTHER): Payer: Medicare Other | Admitting: Family Medicine

## 2014-07-15 ENCOUNTER — Encounter: Payer: Self-pay | Admitting: Family Medicine

## 2014-07-15 ENCOUNTER — Encounter: Payer: Self-pay | Admitting: Emergency Medicine

## 2014-07-15 ENCOUNTER — Encounter: Payer: Self-pay | Admitting: Gastroenterology

## 2014-07-15 ENCOUNTER — Telehealth: Payer: Self-pay | Admitting: Family Medicine

## 2014-07-15 VITALS — BP 108/74 | HR 66 | Temp 98.3°F | Ht 67.75 in | Wt 210.8 lb

## 2014-07-15 DIAGNOSIS — G8929 Other chronic pain: Secondary | ICD-10-CM

## 2014-07-15 DIAGNOSIS — F418 Other specified anxiety disorders: Secondary | ICD-10-CM

## 2014-07-15 DIAGNOSIS — E039 Hypothyroidism, unspecified: Secondary | ICD-10-CM

## 2014-07-15 DIAGNOSIS — Z880 Allergy status to penicillin: Secondary | ICD-10-CM | POA: Diagnosis not present

## 2014-07-15 DIAGNOSIS — E559 Vitamin D deficiency, unspecified: Secondary | ICD-10-CM

## 2014-07-15 DIAGNOSIS — Z0000 Encounter for general adult medical examination without abnormal findings: Secondary | ICD-10-CM

## 2014-07-15 DIAGNOSIS — K2971 Gastritis, unspecified, with bleeding: Secondary | ICD-10-CM | POA: Insufficient documentation

## 2014-07-15 DIAGNOSIS — Z7189 Other specified counseling: Secondary | ICD-10-CM | POA: Insufficient documentation

## 2014-07-15 DIAGNOSIS — G43809 Other migraine, not intractable, without status migrainosus: Secondary | ICD-10-CM

## 2014-07-15 DIAGNOSIS — Z79899 Other long term (current) drug therapy: Secondary | ICD-10-CM | POA: Insufficient documentation

## 2014-07-15 DIAGNOSIS — K219 Gastro-esophageal reflux disease without esophagitis: Secondary | ICD-10-CM

## 2014-07-15 DIAGNOSIS — K922 Gastrointestinal hemorrhage, unspecified: Secondary | ICD-10-CM

## 2014-07-15 DIAGNOSIS — E785 Hyperlipidemia, unspecified: Secondary | ICD-10-CM | POA: Diagnosis not present

## 2014-07-15 DIAGNOSIS — K625 Hemorrhage of anus and rectum: Secondary | ICD-10-CM | POA: Diagnosis present

## 2014-07-15 DIAGNOSIS — G039 Meningitis, unspecified: Secondary | ICD-10-CM

## 2014-07-15 DIAGNOSIS — N3000 Acute cystitis without hematuria: Secondary | ICD-10-CM

## 2014-07-15 DIAGNOSIS — M797 Fibromyalgia: Secondary | ICD-10-CM

## 2014-07-15 DIAGNOSIS — K3184 Gastroparesis: Secondary | ICD-10-CM

## 2014-07-15 LAB — COMPREHENSIVE METABOLIC PANEL
ALBUMIN: 4 g/dL (ref 3.5–5.0)
ALK PHOS: 71 U/L (ref 39–117)
ALK PHOS: 69 U/L (ref 38–126)
ALT: 35 U/L (ref 0–35)
ALT: 34 U/L (ref 14–54)
ANION GAP: 10 (ref 5–15)
AST: 28 U/L (ref 0–37)
AST: 28 U/L (ref 15–41)
Albumin: 4.2 g/dL (ref 3.5–5.2)
BILIRUBIN TOTAL: 0.4 mg/dL (ref 0.2–1.2)
BUN: 16 mg/dL (ref 6–23)
BUN: 18 mg/dL (ref 6–20)
CALCIUM: 9.6 mg/dL (ref 8.4–10.5)
CHLORIDE: 106 meq/L (ref 96–112)
CO2: 28 mEq/L (ref 19–32)
CO2: 27 mmol/L (ref 22–32)
CREATININE: 0.83 mg/dL (ref 0.40–1.20)
Calcium: 9.1 mg/dL (ref 8.9–10.3)
Chloride: 103 mmol/L (ref 101–111)
Creatinine, Ser: 0.88 mg/dL (ref 0.44–1.00)
GFR calc non Af Amer: >60 mL/min (ref >60)
GFR: 77.92 mL/min (ref >60.00)
Glucose, Bld: 106 mg/dL — ABNORMAL HIGH (ref 70–99)
Glucose, Bld: 112 mg/dL — ABNORMAL HIGH (ref 65–99)
Potassium: 3.8 mEq/L (ref 3.5–5.1)
Potassium: 3.9 mmol/L (ref 3.5–5.1)
Sodium: 139 mEq/L (ref 135–145)
Sodium: 140 mmol/L (ref 135–145)
TOTAL PROTEIN: 7.3 g/dL (ref 6.0–8.3)
TOTAL PROTEIN: 7 g/dL (ref 6.5–8.1)
Total Bilirubin: 0.5 mg/dL (ref 0.3–1.2)

## 2014-07-15 LAB — VITAMIN B12: Vitamin B-12: 586 pg/mL (ref 211–911)

## 2014-07-15 LAB — LIPID PANEL
Cholesterol: 195 mg/dL (ref 0–200)
HDL: 46.8 mg/dL (ref >39.00)
LDL CALC: 123 mg/dL — AB (ref 0–99)
NonHDL: 148.2
TRIGLYCERIDES: 128 mg/dL (ref 0.0–149.0)
Total CHOL/HDL Ratio: 4
VLDL: 25.6 mg/dL (ref 0.0–40.0)

## 2014-07-15 LAB — CBC WITH DIFFERENTIAL/PLATELET
Basophils Absolute: 0 10*3/uL (ref 0.0–0.1)
Basophils Relative: 0.4 % (ref 0.0–3.0)
EOS PCT: 0 % (ref 0.0–5.0)
Eosinophils Absolute: 0 10*3/uL (ref 0.0–0.7)
HEMATOCRIT: 43.1 % (ref 36.0–46.0)
Hemoglobin: 14.3 g/dL (ref 12.0–15.0)
LYMPHS ABS: 1.7 10*3/uL (ref 0.7–4.0)
Lymphocytes Relative: 29.2 % (ref 12.0–46.0)
MCHC: 33.3 g/dL (ref 30.0–36.0)
MCV: 84.9 fl (ref 78.0–100.0)
MONO ABS: 0.3 10*3/uL (ref 0.1–1.0)
Monocytes Relative: 5.8 % (ref 3.0–12.0)
NEUTROS ABS: 3.8 10*3/uL (ref 1.4–7.7)
Neutrophils Relative %: 64.6 % (ref 43.0–77.0)
Platelets: 312 10*3/uL (ref 150.0–400.0)
RBC: 5.07 Mil/uL (ref 3.87–5.11)
RDW: 13.1 % (ref 11.5–15.5)
WBC: 5.9 10*3/uL (ref 4.0–10.5)

## 2014-07-15 LAB — CBC
HEMATOCRIT: 41.1 % (ref 35.0–47.0)
Hemoglobin: 13.8 g/dL (ref 12.0–16.0)
MCH: 28.4 pg (ref 26.0–34.0)
MCHC: 33.7 g/dL (ref 32.0–36.0)
MCV: 84.2 fL (ref 80.0–100.0)
Platelets: 284 10*3/uL (ref 150–440)
RBC: 4.87 MIL/uL (ref 3.80–5.20)
RDW: 13 % (ref 11.5–14.5)
WBC: 5.7 10*3/uL (ref 3.6–11.0)

## 2014-07-15 LAB — TYPE AND SCREEN
ABO/RH(D): A POS
Antibody Screen: NEGATIVE

## 2014-07-15 LAB — TSH: TSH: 0.07 u[IU]/mL — AB (ref 0.35–4.50)

## 2014-07-15 LAB — T4, FREE: FREE T4: 1.06 ng/dL (ref 0.60–1.60)

## 2014-07-15 LAB — VITAMIN D 25 HYDROXY (VIT D DEFICIENCY, FRACTURES): VITD: 19.1 ng/mL — ABNORMAL LOW (ref 30.00–100.00)

## 2014-07-15 MED ORDER — HYDROCORTISONE 2.5 % RE CREA: 1.0000 "application " | TOPICAL_CREAM | Freq: Two times a day (BID) | RECTAL | Status: DC

## 2014-07-15 NOTE — Progress Notes (Signed)
BP 108/74 mmHg  Pulse 66  Temp(Src) 98.3 F (36.8 C) (Oral)  Ht 5' 7.75" (1.721 m)  Wt 210 lb 12.8 oz (95.618 kg)  BMI 32.28 kg/m2  LMP 05/19/2014   CC: medicare wellness visit/CPE Subjective:    Patient ID: Karen Aguilar, female    DOB: May 08, 1965, 49 y.o.   MRN: [id number]  HPI: Karen Aguilar is a 49 y.o. 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 Avoiding narcotics for now. Only on nucynta 100mg  QID.  Migraines - worsened recently. Found to be cervicogenic from progression of bulging cervical discs.   Chronic cervical and lumbar spine pain s/p MRI by Dr Saintclair Halsted - post surgical changes L4/5 L5/S1 with moderate degenerative changes of facet joints at L3/4, s/p ACDF with progression of C6/7 disease on right with bulging disc and mod R C7 foram stenosis, stable chronic R C8 foram stenosis. Currently undergoing physical therapy twice weekly.   Chronic gastroparesis sees Dr Deatra Ina. For severe GERD - actually has been tapering off dexilant.   Passes hearing and vision screens.  2+ falls in last year without injury. At risk 2/2 imbalance. Uses cane for ambulation regularly. Some R leg weakness from prior nerve damage s/p multiple back surgeries. PHQ9 = 12. Compliant with lexapro 10mg  daily. Pt self lowered dose because she also takes nucynta. cymbalta worked ok and pt doesn't remember why she stopped this med.  Chronic bladder symptoms - thought neurogenic bladder by Dr Erlene Quan - with PTNS treatment that worsened back pain.   Preventative: Well woman: Dr. Rance Muir [facility]BG[facility]. Seen early 2016. Normal breast exam. More irregular periods for last year.  Mammogram 2016 normal per report.  LMP 06/19/2014 Flu shot - declines (thimerosal) Tetanus - unsure. Has had previously. Advanced planning - discussed. Has at home. will bring me copy. Seat belt use discussed Sunscreen use discussed. No changing  moles on skin.  Lives with husband and 2 daughters (one in college) Occupation: was Public affairs consultant Disability since 2011 - arachnoiditis Edu: associate's degree Activity: severe limitations 2/2 arachnoiditis and chronic pain, walks to end of driveway and back Diet: good water, fruits/vegetables daily  Relevant past medical, surgical, family and social history reviewed and updated as indicated. Interim medical history since our last visit reviewed. Allergies and medications reviewed and updated. Current Outpatient Prescriptions on File Prior to Visit  Medication Sig  . ALPRAZolam (XANAX) 0.5 MG tablet TAKE 1 TABLET BY MOUTH THREE DAILY AS NEEDED.  Marland Kitchen ARMOUR THYROID 90 MG tablet TAKE ONE TABLET BY MOUTH EVERY DAY  . Ascorbic Acid (VITAMIN C PO) Take 1 tablet by mouth as needed.   Marland Kitchen dexlansoprazole (DEXILANT) 60 MG capsule Take 1 capsule (60 mg total) by mouth 2 (two) times daily. (Patient not taking: Reported on 07/15/2014)  . dronabinol (MARINOL) 2.5 MG capsule as needed.   Marland Kitchen escitalopram (LEXAPRO) 10 MG tablet Take 1 tablet (10 mg total) by mouth daily.  . hyoscyamine (LEVSIN SL) 0.125 MG SL tablet PLACE 2 TABLETS UNDER THE TONGUE EVERY 4 HOURS AS NEEDED  . Misc Natural Products (TART CHERRY ADVANCED PO) Take by mouth daily. Pt takes [employer], 2 tablespoons, once a day for osteoarthritis  . Nutritional Supplements (NUTRITIONAL SUPPLEMENT PO) Take 1 tablet by mouth daily. Pt takes Cartalast Supplement, 1 tablet, orally, every night for osteoarthritis  . SYNTHROID 75 MCG tablet TAKE 1 TABLET BY MOUTH EVERY DAY  EXCEPT ONE DAY A WEEK TAKE 1/2 TABLET  . Tapentadol HCl (NUCYNTA) 100 MG TABS Take 1 tablet by mouth every 4 (four) hours.  . topiramate (TOPAMAX) 100 MG tablet TAKE 1 TABLET BY MOUTH EVERY DAY   No current facility-administered medications on file prior to visit.    Review of Systems  Constitutional: Negative for fever, chills, activity change, appetite change,  fatigue and unexpected weight change.  HENT: Negative for hearing loss.   Eyes: Positive for visual disturbance (blurry vision, due for eye exam).  Respiratory: Negative for cough, chest tightness, shortness of breath and wheezing.   Cardiovascular: Positive for chest pain (attributed to IBS, improves with levsin) and leg swelling (chronic occasional R side from nerve damage). Negative for palpitations.  Gastrointestinal: Negative for nausea, vomiting, abdominal pain, diarrhea, constipation (improved off opioids), blood in stool and abdominal distention.       Known gastroparesis  Genitourinary: Positive for dysuria, urgency, frequency and vaginal pain. Negative for hematuria and difficulty urinating.  Musculoskeletal: Negative for myalgias, arthralgias and neck pain.  Skin: Negative for rash.  Neurological: Positive for headaches. Negative for dizziness, seizures and syncope.  Hematological: Negative for adenopathy. Does not bruise/bleed easily.  Psychiatric/Behavioral: Positive for dysphoric mood. The patient is nervous/anxious.    Per HPI unless specifically indicated above     Objective:    BP 108/74 mmHg  Pulse 66  Temp(Src) 98.3 F (36.8 C) (Oral)  Ht 5' 7.75" (1.721 m)  Wt 210 lb 12.8 oz (95.618 kg)  BMI 32.28 kg/m2  LMP 05/19/2014  Wt Readings from Last 3 Encounters:  07/15/14 210 lb 12.8 oz (95.618 kg)  06/17/14 210 lb (95.255 kg)  04/06/14 210 lb 3.2 oz (95.346 kg)    Physical Exam  Constitutional: She is oriented to person, place, and time. She appears well-developed and well-nourished. No distress.  HENT:  Head: Normocephalic and atraumatic.  Right Ear: Hearing, tympanic membrane, external ear and ear canal normal.  Left Ear: Hearing, tympanic membrane, external ear and ear canal normal.  Nose: Nose normal.  Mouth/Throat: Uvula is midline, oropharynx is clear and moist and mucous membranes are normal. No oropharyngeal exudate, posterior oropharyngeal edema or  posterior oropharyngeal erythema.  Eyes: Conjunctivae and EOM are normal. Pupils are equal, round, and reactive to light. No scleral icterus.  Neck: Normal range of motion. Neck supple. No thyromegaly present.  Cardiovascular: Normal rate, regular rhythm, normal heart sounds and intact distal pulses.   No murmur heard. Pulses:      Radial pulses are 2+ on the right side, and 2+ on the left side.  Pulmonary/Chest: Effort normal and breath sounds normal. No respiratory distress. She has no wheezes. She has no rales.  Abdominal: Soft. Bowel sounds are normal. She exhibits no distension and no mass. There is no tenderness. There is no rebound and no guarding.  Musculoskeletal: Normal range of motion. She exhibits no edema.  Lymphadenopathy:    She has no cervical adenopathy.  Neurological: She is alert and oriented to person, place, and time.  CN grossly intact, station and gait intact Recall 3/3 Calculation 4/5 serial 7s  Skin: Skin is warm and dry. No rash noted.  Psychiatric: She has a normal mood and affect. Her behavior is normal. Judgment and thought content normal.  Nursing note and vitals reviewed.     Assessment & Plan:   Problem List Items Addressed This Visit    Acute cystitis without hematuria    I have asked she bring  me copy of Dr Cherrie Gauze notes. She states she has these at home.      Advanced care planning/counseling discussion    Advanced planning - discussed. Has at home. will bring me copy.      Arachnoiditis    Contributes to chronic pain syndrome.      Chronic pain    Followed by pain doctor.      Relevant Medications   tiZANidine (ZANAFLEX) 4 MG tablet   Depression with anxiety    Continue lexapro. Pt decreased dose after starting nucynta.      Fibromyalgia    Struggles with this. Unsure why cymbalta was stopped.      Gastroparesis    Continue f/u with GI. Possible improvement off opiates.      Relevant Orders   CBC with Differential/Platelet     GERD (gastroesophageal reflux disease)    Feels improvement and has been able to taper off dexilant.      Relevant Orders   Vitamin B12   Health maintenance examination    Preventative protocols reviewed and updated unless pt declined. Discussed healthy diet and lifestyle.       Hyperlipidemia    Check FLP today. Off chol med.      Relevant Orders   Lipid panel   Comprehensive metabolic panel   Hypothyroidism    H/o hives to levothyroxine, synthroid brand didn't control sxs. On armour thyroid. Check TFTs today.      Relevant Orders   TSH   T3   T4, free   Medicare annual wellness visit, initial - Primary    I have personally reviewed the Medicare Annual Wellness questionnaire and have noted 1. The patient's medical and social history 2. Their use of alcohol, tobacco or illicit drugs 3. Their current medications and supplements 4. The patient's functional ability including ADL's, fall risks, home safety risks and hearing or visual impairment. Cognitive function has been assessed and addressed as indicated.  5. Diet and physical activity 6. Evidence for depression or mood disorders The patients weight, height, BMI have been recorded in the chart. I have made referrals, counseling and provided education to the patient based on review of the above and I have provided the pt with a written personalized care plan for preventive services. Provider list updated.. See scanned questionairre as needed for further documentation. Reviewed preventative protocols and updated unless pt declined.       Migraines    Worsening. Thought cervicogenic.      Relevant Medications   tiZANidine (ZANAFLEX) 4 MG tablet   Vitamin D deficiency    Recheck vit D today. Not on extra supplementation.      Relevant Orders   Vit D  25 hydroxy (rtn osteoporosis monitoring)       Follow up plan: Return in about 1 year (around 07/15/2015), or as needed, for medicare wellness visit.

## 2014-07-15 NOTE — Assessment & Plan Note (Signed)
Continue f/u with GI. Possible improvement off opiates.

## 2014-07-15 NOTE — Discharge Instructions (Signed)
Please seek medical attention for any high fevers, chest pain, shortness of breath, change in behavior, persistent vomiting, bloody stool or any other new or concerning symptoms.  Rectal Bleeding Rectal bleeding is when blood passes out of the anus. It is usually a sign that something is wrong. It may not be serious, but it should always be evaluated. Rectal bleeding may present as bright red blood or extremely dark stools. The color may range from dark red or maroon to black (like tar). It is important that the cause of rectal bleeding be identified so treatment can be started and the problem corrected. CAUSES   Hemorrhoids. These are enlarged (dilated) blood vessels or veins in the anal or rectal area.  Fistulas. Theseare abnormal, burrowing channels that usually run from inside the rectum to the skin around the anus. They can bleed.  Anal fissures. This is a tear in the tissue of the anus. Bleeding occurs with bowel movements.  Diverticulosis. This is a condition in which pockets or sacs project from the bowel wall. Occasionally, the sacs can bleed.  Diverticulitis. Thisis an infection involving diverticulosis of the colon.  Proctitis and colitis. These are conditions in which the rectum, colon, or both, can become inflamed and pitted (ulcerated).  Polyps and cancer. Polyps are non-cancerous (benign) growths in the colon that may bleed. Certain types of polyps turn into cancer.  Protrusion of the rectum. Part of the rectum can project from the anus and bleed.  Certain medicines.  Intestinal infections.  Blood vessel abnormalities. HOME CARE INSTRUCTIONS  Eat a high-fiber diet to keep your stool soft.  Limit activity.  Drink enough fluids to keep your urine clear or pale yellow.  Warm baths may be useful to soothe rectal pain.  Follow up with your caregiver as directed. SEEK IMMEDIATE MEDICAL CARE IF:  You develop increased bleeding.  You have black or dark red  stools.  You vomit blood or material that looks like coffee grounds.  You have abdominal pain or tenderness.  You have a fever.  You feel weak, nauseous, or you faint.  You have severe rectal pain or you are unable to have a bowel movement. MAKE SURE YOU:  Understand these instructions.  Will watch your condition.  Will get help right away if you are not doing well or get worse. Document Released: 06/09/2001 Document Revised: 03/12/2011 Document Reviewed: 06/04/2010 [facility] Patient Information 2015 [place], [place]. This information is not intended to replace advice given to you by your health care provider. Make sure you discuss any questions you have with your health care provider. Hemorrhoids Hemorrhoids are swollen veins around the rectum or anus. There are two types of hemorrhoids:   Internal hemorrhoids. These occur in the veins just inside the rectum. They may poke through to the outside and become irritated and painful.  External hemorrhoids. These occur in the veins outside the anus and can be felt as a painful swelling or hard lump near the anus. CAUSES  Pregnancy.   Obesity.   Constipation or diarrhea.   Straining to have a bowel movement.   Sitting for long periods on the toilet.  Heavy lifting or other activity that caused you to strain.  Anal intercourse. SYMPTOMS   Pain.   Anal itching or irritation.   Rectal bleeding.   Fecal leakage.   Anal swelling.   One or more lumps around the anus.  DIAGNOSIS  Your caregiver may be able to diagnose hemorrhoids by visual examination. Other examinations or  tests that may be performed include:   Examination of the rectal area with a gloved hand (digital rectal exam).   Examination of anal canal using a small tube (scope).   A blood test if you have lost a significant amount of blood.  A test to look inside the colon (sigmoidoscopy or colonoscopy). TREATMENT Most hemorrhoids can be treated  at home. However, if symptoms do not seem to be getting better or if you have a lot of rectal bleeding, your caregiver may perform a procedure to help make the hemorrhoids get smaller or remove them completely. Possible treatments include:   Placing a rubber band at the base of the hemorrhoid to cut off the circulation (rubber band ligation).   Injecting a chemical to shrink the hemorrhoid (sclerotherapy).   Using a tool to burn the hemorrhoid (infrared light therapy).   Surgically removing the hemorrhoid (hemorrhoidectomy).   Stapling the hemorrhoid to block blood flow to the tissue (hemorrhoid stapling).  HOME CARE INSTRUCTIONS   Eat foods with fiber, such as whole grains, beans, nuts, fruits, and vegetables. Ask your doctor about taking products with added fiber in them (fibersupplements).  Increase fluid intake. Drink enough water and fluids to keep your urine clear or pale yellow.   Exercise regularly.   Go to the bathroom when you have the urge to have a bowel movement. Do not wait.   Avoid straining to have bowel movements.   Keep the anal area dry and clean. Use wet toilet paper or moist towelettes after a bowel movement.   Medicated creams and suppositories may be used or applied as directed.   Only take over-the-counter or prescription medicines as directed by your caregiver.   Take warm sitz baths for 15-20 minutes, 3-4 times a day to ease pain and discomfort.   Place ice packs on the hemorrhoids if they are tender and swollen. Using ice packs between sitz baths may be helpful.   Put ice in a plastic bag.   Place a towel between your skin and the bag.   Leave the ice on for 15-20 minutes, 3-4 times a day.   Do not use a donut-shaped pillow or sit on the toilet for long periods. This increases blood pooling and pain.  SEEK MEDICAL CARE IF:  You have increasing pain and swelling that is not controlled by treatment or medicine.  You have  uncontrolled bleeding.  You have difficulty or you are unable to have a bowel movement.  You have pain or inflammation outside the area of the hemorrhoids. MAKE SURE YOU:  Understand these instructions.  Will watch your condition.  Will get help right away if you are not doing well or get worse. Document Released: 12/16/1999 Document Revised: 12/05/2011 Document Reviewed: 10/23/2011 [facility] Patient Information 2015 [place], [place]. This information is not intended to replace advice given to you by your health care provider. Make sure you discuss any questions you have with your health care provider.

## 2014-07-15 NOTE — Assessment & Plan Note (Signed)
H/o hives to levothyroxine, synthroid brand didn't control sxs. On armour thyroid. Check TFTs today.

## 2014-07-15 NOTE — Telephone Encounter (Signed)
[facility]  Patient Name: Karen Aguilar  DOB: [birth year]    Initial Comment Caller states she is having rectal bleeding   Nurse Assessment  Nurse: Justine Null, RN, Rodena Piety Date/Time (Eastern Time): 07/15/2014 5:01:59 PM  Confirm and document reason for call. If symptomatic, describe symptoms. ---Caller states she is having rectal bleeding that started after he BM and has bright red blood and has been having a large amount of bleeding at present and has some hemorrhoids and has been having the bleeding has had banding of the hemorrhoids in the fall of last year  Has the patient traveled out of the country within the last 30 days? ---No  Does the patient require triage? ---Yes  Related visit to physician within the last 2 weeks? ---Yes  Does the PT have any chronic conditions? (i.e. diabetes, asthma, etc.) ---Yes  List chronic conditions. ---thyroid issues spinal senosis arthritis GERD gastroparesis  Did the patient indicate they were pregnant? ---No     Guidelines    Guideline Title Affirmed Question Affirmed Notes  Rectal Bleeding Toilet water turned red (Exceptions: turned a little pink, few drops of blood)    Final Disposition User   See Physician within 4 Hours (or PCP triage) Justine Null, RN, [facility] - ED   Disagree/Comply: Disagree  Disagree/Comply Reason: Disagree with instructions

## 2014-07-15 NOTE — Assessment & Plan Note (Signed)
Contributes to chronic pain syndrome.

## 2014-07-15 NOTE — Progress Notes (Signed)
Pre visit review using our clinic review tool, if applicable. No additional management support is needed unless otherwise documented below in the visit note. 

## 2014-07-15 NOTE — Assessment & Plan Note (Signed)
Check FLP today. Off chol med.

## 2014-07-15 NOTE — Assessment & Plan Note (Signed)
Struggles with this. Unsure why cymbalta was stopped.

## 2014-07-15 NOTE — Assessment & Plan Note (Signed)

## 2014-07-15 NOTE — Patient Instructions (Addendum)
Bring me copy of Dr Cherrie Gauze notes.  Fasting labwork today. Return as needed or in 1 year for next medicare wellness visit. Good to see you today, call us with questions.

## 2014-07-15 NOTE — Assessment & Plan Note (Signed)
Feels improvement and has been able to taper off dexilant.

## 2014-07-15 NOTE — Assessment & Plan Note (Signed)
Advanced planning - discussed. Has at home. will bring me copy.

## 2014-07-15 NOTE — Assessment & Plan Note (Signed)
Continue lexapro. Pt decreased dose after starting nucynta.

## 2014-07-15 NOTE — ED Provider Notes (Signed)
[facility] Emergency Department Provider Note    ____________________________________________  Time seen: 0745  I have reviewed the triage vital signs and the nursing notes.   HISTORY  Chief Complaint Rectal Bleeding   History limited by: Not Limited   HPI Karen Aguilar is a 49 y.o. female who presents to the emergency department today because of concerns for rectal bleeding. The patient states that she had a bowel movement today and then noticed bright red blood in the toilet and then when she wiped. This started this afternoon. Since then any time she has tried wiping she has noticed bright red blood. She states she has had maybe a slight feeling of discomfort although no true pain with any of this bleeding. Does have a history of hemorrhoids requiring banding but has never had a bleeding hemorrhoid in the past.     Past Medical History  Diagnosis Date  . PONV (postoperative nausea and vomiting)   . Hypothyroidism   . Anemia     etiology unknown-iron infusion in past,last 9/12  . Fibromyalgia   . Headache(784.0)     migraines  . Incontinence of urine     s/p sling procedure-unresolved  . GERD (gastroesophageal reflux disease)     rare zantac PRN  . Chronic pain     pain contract with Dr. Eartha Inch Pain  . Sleep apnea     mild-doesn't use cpap  . Depression with anxiety   . 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 Hyperlipidemia   . Migraines     and h/o optical migraine  . Vitamin D deficiency   . Iron deficiency     h/o anemia, s/p iron infusion (2012)  . DDD (degenerative disc disease), lumbar     s/p permanent nerve damage after back surgery  . DDD (degenerative disc disease), cervical   . Neuropathy of lower extremity 2011  . Hemorrhoid   . Anxiety and depression   . Arthritis   . Diverticulosis   . Gallstones   . IBS (irritable bowel syndrome)   . Kidney stones   . Pulmonary nodule 04/2012    7.49mm RLL nodule -  resolved on imaging 12/2012  . Abnormal MRI scan, bone 2012    abnormal marrow signal humeral head, glenoid and scapula - diffuse replacement of fatty marrow - likely benign, no need for further investigation ([place])  . Migraines   . Osteoarthritis 12/2013  . GERD (gastroesophageal reflux disease)     Patient Active Problem List   Diagnosis Date Noted  . Advanced care planning/counseling discussion 07/15/2014  . Medicare annual wellness visit, initial 07/15/2014  . Health maintenance examination 07/15/2014  . Skin lesions 03/03/2014  . Acute cystitis without hematuria 03/02/2014  . Irritable bowel syndrome 02/23/2014  . Chest pain 07/27/2013  . Dysphagia, unspecified(787.20) 06/05/2013  . Gastroparesis 05/04/2013  . Solitary pulmonary nodule 03/18/2013  . Leg edema 02/13/2013  . Unspecified constipation 02/10/2013  . Internal hemorrhoids 02/10/2013  . Hypothyroidism   . Fibromyalgia   . GERD (gastroesophageal reflux disease)   . Chronic pain   . Sleep apnea   . Depression with anxiety   . Arachnoiditis   . Hyperlipidemia   . Migraines   . Vitamin D deficiency   . Iron deficiency   . DDD (degenerative disc disease), lumbar     Past Surgical History  Procedure Laterality Date  . Lasik Bilateral   . Cholecystectomy  2001  . Pubovaginal  sling  2008  . Shoulder closed reduction Left 12/18/2010    Procedure: CLOSED MANIPULATION SHOULDER;  Surgeon: Johnn Hai; labrial debridement  . Back surgery      multiple-L4-s1,hardware,fusion,removal,infection s/p 5/11 procedure  . Back surgery  02/16/11    "my 7th back OR; Procedure: Exploration of fusion removal of hardware L4-S1, harvesting of right iliac crest bone graft, redo posterior lateral fusion L4-5 using iliac crest bone graft BMP and master graft replacement of bilateral L4 screws with a Telfa tech 7.5 x 40 mm screw on the right and 8.5 x 40 mm screw on the left placement of a large Hemovac drain  . Anterior cervical  decomp/discectomy fusion      C3, 4, 5 - improved after surgery  . Cesarean section  1996; 2001    x 2  . Tonsillectomy      as a child; "then they grew back"  . Lumbar wound debridement  03/15/2011    Procedure: LUMBAR WOUND DEBRIDEMENT;  Surgeon: Elaina Hoops, MD;  Lumbar Wound Debridement  . Spine surgery    . Pft  01/28/2013    WNL  . Esophagogastroduodenoscopy  03/2013    early esophageal stricture dilated, o/w WNL Deatra Ina)    Current Outpatient Rx  Name  Route  Sig  Dispense  Refill  . ALPRAZolam (XANAX) 0.5 MG tablet      TAKE 1 TABLET BY MOUTH THREE DAILY AS NEEDED.         Marland Kitchen ARMOUR THYROID 90 MG tablet      TAKE ONE TABLET BY MOUTH EVERY DAY   30 tablet   9   . Ascorbic Acid (VITAMIN C PO)   Oral   Take 1 tablet by mouth as needed.          Marland Kitchen dexlansoprazole (DEXILANT) 60 MG capsule   Oral   Take 1 capsule (60 mg total) by mouth 2 (two) times daily. Patient not taking: Reported on 07/15/2014   60 capsule   11   . dronabinol (MARINOL) 2.5 MG capsule      as needed.       0   . escitalopram (LEXAPRO) 10 MG tablet   Oral   Take 1 tablet (10 mg total) by mouth daily.   30 tablet   11   . hyoscyamine (LEVSIN SL) 0.125 MG SL tablet      PLACE 2 TABLETS UNDER THE TONGUE EVERY 4 HOURS AS NEEDED   30 tablet   0   . Misc Natural Products (TART CHERRY ADVANCED PO)   Oral   Take by mouth daily. Pt takes [employer], 2 tablespoons, once a day for osteoarthritis         . Nutritional Supplements (NUTRITIONAL SUPPLEMENT PO)   Oral   Take 1 tablet by mouth daily. Pt takes Cartalast Supplement, 1 tablet, orally, every night for osteoarthritis         . SYNTHROID 75 MCG tablet      TAKE 1 TABLET BY MOUTH EVERY DAY EXCEPT ONE DAY A WEEK TAKE 1/2 TABLET   30 tablet   0     Dispense as written.    Office visit with labs required for additional ref ...   . Tapentadol HCl (NUCYNTA) 100 MG TABS   Oral   Take 1 tablet by mouth every 4 (four)  hours.         Marland Kitchen tiZANidine (ZANAFLEX) 4 MG tablet   Oral   Take  4 mg by mouth every 8 (eight) hours as needed for muscle spasms.         Marland Kitchen topiramate (TOPAMAX) 100 MG tablet      TAKE 1 TABLET BY MOUTH EVERY DAY   30 tablet   3     Allergies Erythromycin; Thimerosal; Adhesive; Augmentin; Gabapentin; Influenza vaccines; Levothyroxine; Lyrica; Azithromycin; Macrolides and ketolides; and Sulfa antibiotics  Family History  Problem Relation Age of Onset  . Lung cancer Father 30     (smoker)  . Diabetes Brother   . Lung cancer Paternal Grandfather      (smoker)  . Colon cancer Paternal Grandmother   . Thyroid disease Mother   . Irritable bowel syndrome Mother   . Dysphagia Mother   . Thyroid disease Cousin   . Cancer Other     unsure (ovarian/uterine)  . Breast cancer Maternal Aunt   . CAD Maternal Grandfather     MI  . Heart disease Maternal Grandfather   . Heart attack Maternal Grandfather   . Alzheimer's disease Maternal Grandmother   . Diabetes Daughter     Social History History  Substance Use Topics  . Smoking status: Never Smoker   . Smokeless tobacco: Never Used  . Alcohol Use: No    Review of Systems  Constitutional: Negative for fever. Cardiovascular: Negative for chest pain. Respiratory: Negative for shortness of breath. Gastrointestinal: Negative for abdominal pain, vomiting and diarrhea.positive for rectal bleeding Genitourinary: Negative for dysuria. Musculoskeletal: Negative for back pain. Skin: Negative for rash. Neurological: Negative for headaches, focal weakness or numbness.   10-point ROS otherwise negative.  ____________________________________________   PHYSICAL EXAM:  VITAL SIGNS: ED Triage Vitals  Enc Vitals Group     BP 07/15/14 1834 117/71 mmHg     Pulse Rate 07/15/14 1834 92     Resp 07/15/14 1834 18     Temp 07/15/14 1834 97.8 F (36.6 C)     Temp Source 07/15/14 1834 Oral     SpO2 07/15/14 1834 97 %     Weight  --      Height --      Head Cir --      Peak Flow --      Pain Score 07/15/14 1835 7     Pain Loc --      Pain Edu? --      Excl. in [place]? --      Constitutional: Alert and oriented. Well appearing and in no distress. Eyes: Conjunctivae are normal. PERRL. Normal extraocular movements. ENT   Head: Normocephalic and atraumatic.   Nose: No congestion/rhinnorhea.   Mouth/Throat: Mucous membranes are moist.   Neck: No stridor. Hematological/Lymphatic/Immunilogical: No cervical lymphadenopathy. Cardiovascular: Normal rate, regular rhythm.   Respiratory: Normal respiratory effort without tachypnea nor retractions.  Gastrointestinal: Soft and nontender. No distention. \ Rectal: small amount of dried blood around the anus. No active bleeding. No obvious bleeding hemorrhoids. No obvious fissures. Musculoskeletal: Normal range of motion in all extremities. No joint effusions.  No lower extremity tenderness nor edema. Neurologic:  Normal speech and language. No gross focal neurologic deficits are appreciated. Speech is normal.  Skin:  Skin is warm, dry and intact. No rash noted. Psychiatric: Mood and affect are normal. Speech and behavior are normal. Patient exhibits appropriate insight and judgment.  ____________________________________________    LABS (pertinent positives/negatives)  Labs Reviewed  COMPREHENSIVE METABOLIC PANEL - Abnormal; Notable for the following:    Glucose, Bld 112 (*)    All other  components within normal limits  CBC  POC OCCULT BLOOD, ED  TYPE AND SCREEN  ABO/RH     ____________________________________________   EKG  None  ____________________________________________    RADIOLOGY  None  ____________________________________________   PROCEDURES  Procedure(s) performed: None  Critical Care performed: No  ____________________________________________   INITIAL IMPRESSION / ASSESSMENT AND PLAN / ED COURSE  Pertinent labs &  imaging results that were available during my care of the patient were reviewed by me and considered in my medical decision making (see chart for details).  Patient presents with bright red rectal bleeding. Patient does have a history of hemorrhoids. On exam patient without any active bleeding noticed from anus. The last bleeding external hemorrhoids. Blood work without anemia. Discussed with patient that I think likely this is a bleeding internal hemorrhoid. Did discuss with patient's importance of following up with her GI doctor. Did discuss return precautions with patient.  ____________________________________________   FINAL CLINICAL IMPRESSION(S) / ED DIAGNOSES  Final diagnoses:  Gastrointestinal hemorrhage, unspecified gastritis, unspecified gastrointestinal hemorrhage type     Nance Pear, MD 07/15/14 2140

## 2014-07-15 NOTE — ED Notes (Signed)
Pt presents to ED with c/o rectal bleeding, onset today with bowl movement. Pt reports bright red. Pt states feels like hemorrhoid. Hx of hemorrhoid. Pt denies taking NSAID or blood thinner.

## 2014-07-15 NOTE — Assessment & Plan Note (Signed)
Followed by pain doctor.

## 2014-07-15 NOTE — Assessment & Plan Note (Signed)
I have asked she bring me copy of Dr Cherrie Gauze notes. She states she has these at home.

## 2014-07-15 NOTE — Assessment & Plan Note (Addendum)
Recheck vit D today. Not on extra supplementation.

## 2014-07-15 NOTE — Assessment & Plan Note (Signed)
Worsening. Thought cervicogenic.

## 2014-07-15 NOTE — Assessment & Plan Note (Signed)
Preventative protocols reviewed and updated unless pt declined. Discussed healthy diet and lifestyle.  

## 2014-07-16 LAB — ABO/RH: ABO/RH(D): A POS

## 2014-07-16 LAB — T3: T3, Total: 260.9 ng/dL — ABNORMAL HIGH (ref 80.0–204.0)

## 2014-07-16 NOTE — Telephone Encounter (Signed)
Noted. I think ok to try steroid suppositories.  Will review mychart when I can.

## 2014-07-16 NOTE — Telephone Encounter (Signed)
Message left notifying patient.

## 2014-07-16 NOTE — Telephone Encounter (Signed)
PLEASE NOTE: All timestamps contained within this report are represented as [place] Standard Time. CONFIDENTIALTY NOTICE: This fax transmission is intended only for the addressee. It contains information that is legally privileged, confidential or otherwise protected from use or disclosure. If you are not the intended recipient, you are strictly prohibited from reviewing, disclosing, copying using or disseminating any of this information or taking any action in reliance on or regarding this information. If you have received this fax in error, please notify us immediately by telephone so that we can arrange for its return to [place]. Phone: [phone], Toll-Free: [phone], Fax: [phone] Page: 1 of 2 Call Id: [id number] [place] Patient Name: Karen Aguilar Gender: Female DOB: 1965-11-13 Age: 49 Y 2 M 13 D Return Phone Number: [phone] (Primary) Address: City/State/Zip: [place] Client [place] [facility] - Client Client Site [place] Physician Ria Bush Contact Type Call Call Type Triage / Clinical Relationship To Patient Self Appointment Disposition EMR Appointment Not Necessary Info pasted into Epic Yes Return Phone Number [phone] (Primary) Chief Complaint Rectal Bleeding Initial Comment Caller states she is having rectal bleeding PreDisposition Call Doctor Nurse Assessment Nurse: Justine Null, RN, Rodena Piety Date/Time (Eastern Time): 07/15/2014 5:01:59 PM Confirm and document reason for call. If symptomatic, describe symptoms. ---Caller states she is having rectal bleeding that started after he BM and has bright red blood and has been having a large amount of bleeding at present and has some hemorrhoids and has been having the bleeding has had banding of the hemorrhoids in the fall of last year Has the patient traveled out of the country  within the last 30 days? ---No Does the patient require triage? ---Yes Related visit to physician within the last 2 weeks? ---Yes Does the PT have any chronic conditions? (i.e. diabetes, asthma, etc.) ---Yes List chronic conditions. ---thyroid issues spinal senosis arthritis GERD gastroparesis Did the patient indicate they were pregnant? ---No Guidelines Guideline Title Affirmed Question Affirmed Notes Nurse Date/Time (Eastern Time) Rectal Bleeding Toilet water turned red (Exceptions: turned a little pink, few drops of blood) Rowe, RN, Rodena Piety 07/15/2014 5:05:13 PM Disp. Time Eilene Ghazi Time) Disposition Final User 07/15/2014 5:08:34 PM See Physician within 4 Hours (or PCP triage) Yes Rowe, RN, Rodena Piety PLEASE NOTE: All timestamps contained within this report are represented as [place] Standard Time. CONFIDENTIALTY NOTICE: This fax transmission is intended only for the addressee. It contains information that is legally privileged, confidential or otherwise protected from use or disclosure. If you are not the intended recipient, you are strictly prohibited from reviewing, disclosing, copying using or disseminating any of this information or taking any action in reliance on or regarding this information. If you have received this fax in error, please notify us immediately by telephone so that we can arrange for its return to [place]. Phone: [phone], Toll-Free: [phone], Fax: [phone] Page: 2 of 2 Call Id: [id number] [place] Understands: Yes Disagree/Comply: Disagree Disagree/Comply Reason: Disagree with instructions Care Advice Given Per Guideline SEE PHYSICIAN WITHIN 4 HOURS (or PCP triage): * IF NO PCP TRIAGE: You need to be seen. Go to _______________ (ED/ [facility] or office if it will be open) within the next 3 or 4 hours. Go sooner if you become worse. BRING MEDICINES: * Please bring a list of your current medicines when you go to see the doctor. * It is also a good idea to bring the  pill bottles  too. This will help the doctor to make certain you are taking the right medicines and the right dose. CARE ADVICE given per Rectal Bleeding (Adult) guideline. CALL BACK IF: * Dizziness occurs * Bleeding increases * You become worse. After Care Instructions Given Call Event Type User Date / Time Description Referrals [facility] - ED

## 2014-07-16 NOTE — Telephone Encounter (Signed)
Spoke with patient and she said she went to [facility] ER last night and was Dx with hemorrhoids. Given Rx for suppositories which she hasn't filled because she is hesitant to due to the fact they were steroids. She has a message into GI and sent you a Mychart message as well.

## 2014-07-17 ENCOUNTER — Other Ambulatory Visit: Payer: Self-pay | Admitting: Family Medicine

## 2014-07-17 MED ORDER — SYNTHROID 75 MCG PO TABS: ORAL_TABLET | ORAL | Status: DC

## 2014-07-17 MED ORDER — THYROID 60 MG PO TABS: 60.0000 mg | ORAL_TABLET | Freq: Every day | ORAL | Status: DC

## 2014-07-17 MED ORDER — VITAMIN D3 25 MCG (1000 UT) PO CAPS: 1.0000 | ORAL_CAPSULE | Freq: Every day | ORAL | Status: DC

## 2014-07-19 ENCOUNTER — Encounter: Payer: Self-pay | Admitting: Family Medicine

## 2014-08-10 ENCOUNTER — Encounter: Payer: Medicare Other | Admitting: Gastroenterology

## 2014-08-10 ENCOUNTER — Encounter: Payer: Self-pay | Admitting: Gastroenterology

## 2014-08-10 ENCOUNTER — Ambulatory Visit (INDEPENDENT_AMBULATORY_CARE_PROVIDER_SITE_OTHER): Payer: Medicare Other | Admitting: Gastroenterology

## 2014-08-10 VITALS — BP 114/80 | HR 84 | Ht 67.5 in | Wt 215.5 lb

## 2014-08-10 DIAGNOSIS — K589 Irritable bowel syndrome without diarrhea: Secondary | ICD-10-CM | POA: Diagnosis not present

## 2014-08-10 DIAGNOSIS — K648 Other hemorrhoids: Secondary | ICD-10-CM

## 2014-08-10 NOTE — Assessment & Plan Note (Signed)
She cannot afford Viberzi.  She actually takes parallax if she does not have a bowel movement in 2-3 days.  Plan no further medications.

## 2014-08-10 NOTE — Patient Instructions (Signed)
Follow up as needed

## 2014-08-10 NOTE — Progress Notes (Signed)
      History of Present Illness:  Karen Aguilar continues to complain of urgency although diarrhea is significantly improved.  On one occasion last month she had bright red blood per rectum for which she went to the ED.  It was felt that she burst a hemorrhoid.  Except for some difficulty with cleaning her hemorrhoidal symptoms seem rather minimal.    Review of Systems: Pertinent positive and negative review of systems were noted in the above HPI section. All other review of systems were otherwise negative.    Current Medications, Allergies, Past Medical History, Past Surgical History, Family History and Social History were reviewed in [facility] record  Vital signs were reviewed in today's medical record. Physical Exam: General: Well developed , well nourished, no acute distress Examination of the anus and rectum is unremarkable  See Assessment and Plan under Problem List

## 2014-08-10 NOTE — Assessment & Plan Note (Signed)
Symptoms are actually rather minimal.  Do not plan any intervention at this time

## 2014-09-15 ENCOUNTER — Telehealth: Payer: Self-pay | Admitting: Family Medicine

## 2014-09-15 DIAGNOSIS — E039 Hypothyroidism, unspecified: Secondary | ICD-10-CM

## 2014-09-15 DIAGNOSIS — E559 Vitamin D deficiency, unspecified: Secondary | ICD-10-CM

## 2014-09-15 NOTE — Telephone Encounter (Signed)
Labs ordered. Noted appt scheduled for tomorrow. Would offer lab visit only (save a copay) unless she wants office visit as well.

## 2014-09-15 NOTE — Telephone Encounter (Signed)
Spoke with patient. She didn't have any complaints and didn't need to be seen for anything. Appt cancelled and lab appt scheduled.

## 2014-09-16 ENCOUNTER — Other Ambulatory Visit (INDEPENDENT_AMBULATORY_CARE_PROVIDER_SITE_OTHER): Payer: Medicare Other

## 2014-09-16 ENCOUNTER — Ambulatory Visit: Payer: Self-pay | Admitting: Family Medicine

## 2014-09-16 DIAGNOSIS — E559 Vitamin D deficiency, unspecified: Secondary | ICD-10-CM

## 2014-09-16 DIAGNOSIS — E039 Hypothyroidism, unspecified: Secondary | ICD-10-CM

## 2014-09-17 LAB — VITAMIN D 25 HYDROXY (VIT D DEFICIENCY, FRACTURES): VITD: 29.15 ng/mL — ABNORMAL LOW (ref 30.00–100.00)

## 2014-09-17 LAB — TSH: TSH: 0.09 u[IU]/mL — AB (ref 0.35–4.50)

## 2014-09-17 LAB — T4, FREE: Free T4: 0.92 ng/dL (ref 0.60–1.60)

## 2014-09-18 LAB — T3: T3 TOTAL: 148.2 ng/dL (ref 80.0–204.0)

## 2014-09-19 ENCOUNTER — Other Ambulatory Visit: Payer: Self-pay | Admitting: Family Medicine

## 2014-09-19 DIAGNOSIS — E039 Hypothyroidism, unspecified: Secondary | ICD-10-CM

## 2014-09-19 MED ORDER — LEVOTHYROXINE SODIUM 50 MCG PO TABS: 50.0000 ug | ORAL_TABLET | Freq: Every day | ORAL | Status: DC

## 2014-09-20 ENCOUNTER — Encounter: Payer: Self-pay | Admitting: Family Medicine

## 2014-09-20 NOTE — Telephone Encounter (Signed)
Please see Mychart message from patient.  

## 2014-09-22 ENCOUNTER — Ambulatory Visit (INDEPENDENT_AMBULATORY_CARE_PROVIDER_SITE_OTHER): Payer: Medicare Other | Admitting: Family Medicine

## 2014-09-22 ENCOUNTER — Encounter: Payer: Self-pay | Admitting: Family Medicine

## 2014-09-22 VITALS — BP 112/54 | HR 102 | Temp 98.1°F | Wt 212.0 lb

## 2014-09-22 DIAGNOSIS — R519 Headache, unspecified: Secondary | ICD-10-CM | POA: Insufficient documentation

## 2014-09-22 DIAGNOSIS — E038 Other specified hypothyroidism: Secondary | ICD-10-CM

## 2014-09-22 DIAGNOSIS — R350 Frequency of micturition: Secondary | ICD-10-CM | POA: Diagnosis not present

## 2014-09-22 DIAGNOSIS — E559 Vitamin D deficiency, unspecified: Secondary | ICD-10-CM | POA: Diagnosis not present

## 2014-09-22 DIAGNOSIS — R51 Headache: Secondary | ICD-10-CM

## 2014-09-22 DIAGNOSIS — G8929 Other chronic pain: Secondary | ICD-10-CM | POA: Insufficient documentation

## 2014-09-22 DIAGNOSIS — G039 Meningitis, unspecified: Secondary | ICD-10-CM

## 2014-09-22 DIAGNOSIS — G43809 Other migraine, not intractable, without status migrainosus: Secondary | ICD-10-CM

## 2014-09-22 DIAGNOSIS — E039 Hypothyroidism, unspecified: Secondary | ICD-10-CM | POA: Diagnosis not present

## 2014-09-22 LAB — POCT URINALYSIS DIPSTICK
Leukocytes, UA: NEGATIVE
NITRITE UA: NEGATIVE
PH UA: 5.5
RBC UA: NEGATIVE
UROBILINOGEN UA: 0.2

## 2014-09-22 NOTE — Patient Instructions (Addendum)
Urinalysis today.  Return tomorrow fasting for blood work We will order MRI of brain to evaluate for central cause of abnormal thyroid function.

## 2014-09-22 NOTE — Telephone Encounter (Signed)
Will see today. Consider am cortisol, prolactin, FSH, LH and IGF1, ?estradiol

## 2014-09-22 NOTE — Assessment & Plan Note (Signed)
See above. Check MRI

## 2014-09-22 NOTE — Assessment & Plan Note (Signed)
Associated with fatigue, blurry vision, and tinnitus.  Check brain MRI to eval for pseudotumor cerebri as well as eval pituitary anatomy and help r/o empty sella.  Pt agrees with plan.

## 2014-09-22 NOTE — Assessment & Plan Note (Signed)
Check UA today - no infection, but likely some dehydration. Encouraged increased water intake.

## 2014-09-22 NOTE — Progress Notes (Signed)
BP 112/54 mmHg  Pulse 102  Temp(Src) 98.1 F (36.7 C) (Oral)  Wt 212 lb (96.163 kg)  SpO2 98%   CC: discuss thyroid function  Subjective:    Patient ID: Karen Aguilar, female    DOB: [birth year], 49 y.o.   MRN: [id number]  HPI: Karen Aguilar is a 49 y.o. female presenting on 09/22/2014 for Acute Visit   Dx with hypothyroidism at age 6 yo, initial Hashimoto thyroiditis - on combination synthroid and armour thyroid for last 30 years. Recent TSH readings over the last few months have been low so we have been decreasing synthroid dose - pt stated armour thyroid worked better for her and desired to come down on synthroid. She takes armour thyroid and synthroid appropriately. Has allergy to generic levothyroxine (hives). Recent reading again low TSH - but pt is worried she may have central hypothyroidism as her mother has empty sella syndrome. Patient is currently on brand Synthroid 47mcg daily and armour thyroid 60mg  daily.  Also with chronic headaches and extreme fatigue that started in March 2016 along with intermittent blurry vision and worsening tinnitus.   Periods are becoming irregular, infrequent and light. LMP 09/02/2014 - cramping with spotting. Prior to this 06/19/2014 with few days of spotting. Prior to this had 2 periods in May 2016. Well woman exam with OBGYN ([facility]) was 03/2014. Menarche at age 73 yo.  Noticing increased frequency recently and nocturia (x8 last night). Tizanidine helping with bladder spasms. Brings urology records to review. Also brings living will today.   Avoids flu shot 2/2 thimerosal allergy.   Relevant past medical, surgical, family and social history reviewed and updated as indicated. Interim medical history since our last visit reviewed. Allergies and medications reviewed and updated. Current Outpatient Prescriptions on File Prior to Visit  Medication Sig  . ALPRAZolam (XANAX) 0.5 MG tablet TAKE 1 TABLET BY MOUTH THREE DAILY AS NEEDED.  . Ascorbic Acid  (VITAMIN C PO) Take 1 tablet by mouth as needed.   . Cholecalciferol (VITAMIN D3) 1000 UNITS CAPS Take 1 capsule (1,000 Units total) by mouth daily.  Marland Kitchen dexlansoprazole (DEXILANT) 60 MG capsule Take 1 capsule (60 mg total) by mouth 2 (two) times daily.  Marland Kitchen dronabinol (MARINOL) 2.5 MG capsule as needed.   Marland Kitchen escitalopram (LEXAPRO) 10 MG tablet Take 1 tablet (10 mg total) by mouth daily.  . hydrocortisone (PROCTOSOL HC) 2.5 % rectal cream Place 1 application rectally 2 (two) times daily.  . hyoscyamine (LEVSIN SL) 0.125 MG SL tablet PLACE 2 TABLETS UNDER THE TONGUE EVERY 4 HOURS AS NEEDED  . Misc Natural Products (TART CHERRY ADVANCED PO) Take by mouth daily. Pt takes [employer], 2 tablespoons, once a day for osteoarthritis  . Nutritional Supplements (NUTRITIONAL SUPPLEMENT PO) Take 1 tablet by mouth daily. Pt takes Cartalast Supplement, 1 tablet, orally, every night for osteoarthritis  . Tapentadol HCl (NUCYNTA) 100 MG TABS Take 1 tablet by mouth every 4 (four) hours.  Marland Kitchen thyroid (ARMOUR) 60 MG tablet Take 1 tablet (60 mg total) by mouth daily.  Marland Kitchen tiZANidine (ZANAFLEX) 4 MG tablet Take 4 mg by mouth every 8 (eight) hours as needed for muscle spasms.   Marland Kitchen topiramate (TOPAMAX) 100 MG tablet TAKE 1 TABLET BY MOUTH EVERY DAY   No current facility-administered medications on file prior to visit.   Past Medical History  Diagnosis Date  . PONV (postoperative nausea and vomiting)   . Hypothyroidism 1990s    hashimoto's thyroiditis  . Anemia  etiology unknown-iron infusion in past,last 9/12  . Fibromyalgia   . Headache(784.0)     migraines  . Incontinence of urine     s/p sling procedure-unresolved  . GERD (gastroesophageal reflux disease)     rare zantac PRN  . Chronic pain     pain contract with Dr. Eartha Aguilar Pain  . Sleep apnea     mild-doesn't use cpap  . Depression with anxiety   . 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 Hyperlipidemia   . Migraines     and h/o  optical migraine  . Vitamin D deficiency   . Iron deficiency     h/o anemia, s/p iron infusion (2012)  . DDD (degenerative disc disease), lumbar     s/p permanent nerve damage after back surgery  . DDD (degenerative disc disease), cervical   . Neuropathy of lower extremity 2011  . Hemorrhoid   . Anxiety and depression   . Arthritis   . Diverticulosis   . Gallstones   . IBS (irritable bowel syndrome)   . Kidney stones   . Pulmonary nodule 04/2012    7.73mm RLL nodule - resolved on imaging 12/2012  . Abnormal MRI scan, bone 2012    abnormal marrow signal humeral head, glenoid and scapula - diffuse replacement of fatty marrow - likely benign, no need for further investigation ([facility])  . Migraines   . Osteoarthritis 12/2013  . GERD (gastroesophageal reflux disease)   . Hemorrhoids     Review of Systems Per HPI unless specifically indicated above     Objective:    BP 112/54 mmHg  Pulse 102  Temp(Src) 98.1 F (36.7 C) (Oral)  Wt 212 lb (96.163 kg)  SpO2 98%  Wt Readings from Last 3 Encounters:  09/22/14 212 lb (96.163 kg)  08/10/14 215 lb 8 oz (97.75 kg)  07/15/14 210 lb 12.8 oz (95.618 kg)    Physical Exam  Constitutional: She appears well-developed and well-nourished. No distress.  HENT:  Mouth/Throat: Oropharynx is clear and moist. No oropharyngeal exudate.  Eyes: Conjunctivae and EOM are normal. Pupils are equal, round, and reactive to light.  Neck: Normal range of motion. Neck supple. No thyromegaly present.  Cardiovascular: Normal rate, regular rhythm, normal heart sounds and intact distal pulses.   No murmur heard. Pulmonary/Chest: Effort normal and breath sounds normal. No respiratory distress. She has no wheezes. She has no rales.  Musculoskeletal: She exhibits no edema.  Lymphadenopathy:    She has no cervical adenopathy.  Skin: Skin is warm and dry. No rash noted.  Psychiatric: She has a normal mood and affect.  Nursing note and vitals reviewed.  Results  for orders placed or performed in visit on 09/22/14  POCT Urinalysis Dipstick  Result Value Ref Range   Color, UA Yellow    Clarity, UA Clear    Glucose, UA Trace    Bilirubin, UA 2+    Ketones, UA 1+    Spec Grav, UA >=1.030    Blood, UA Neg    pH, UA 5.5    Protein, UA Trace    Urobilinogen, UA 0.2    Nitrite, UA Neg    Leukocytes, UA Negative Negative   Lab Results  Component Value Date   CREATININE 0.88 07/15/2014       Assessment & Plan:   Problem List Items Addressed This Visit    Vitamin D deficiency    Improving - but she hasn't seen improvement in fatigue.  Urine frequency    Check UA today - no infection, but likely some dehydration. Encouraged increased water intake.      Relevant Orders   POCT Urinalysis Dipstick (Completed)   Hypothyroidism - Primary    Longstanding history of hypothyroidism, but now with persistently low TSH despite no recent change in treatment regimen and pt denies thyroid-related symptoms. Pt worried about central cause of low TSH, especially given decreasing doses of synthroid and armour thyroid have not had effect on TSH, and given other endorsed sxs (headache, fatigue). Will check other hormone levels for other evidence of pituitary dysfunction as well as MRI to eval for empty sella.       Relevant Medications   SYNTHROID 75 MCG tablet   Other Relevant Orders   MR Brain Wo Contrast   Chronic headache    Associated with fatigue, blurry vision, and tinnitus.  Check brain MRI to eval for pseudotumor cerebri as well as eval pituitary anatomy and help r/o empty sella.  Pt agrees with plan.      Relevant Orders   MR Brain Wo Contrast       Follow up plan: Return if symptoms worsen or fail to improve.

## 2014-09-22 NOTE — Assessment & Plan Note (Signed)
Longstanding history of hypothyroidism, but now with persistently low TSH despite no recent change in treatment regimen and pt denies thyroid-related symptoms. Pt worried about central cause of low TSH, especially given decreasing doses of synthroid and armour thyroid have not had effect on TSH, and given other endorsed sxs (headache, fatigue). Will check other hormone levels for other evidence of pituitary dysfunction as well as MRI to eval for empty sella.

## 2014-09-22 NOTE — Progress Notes (Signed)
Pre visit review using our clinic review tool, if applicable. No additional management support is needed unless otherwise documented below in the visit note. 

## 2014-09-22 NOTE — Assessment & Plan Note (Signed)
Improving - but she hasn't seen improvement in fatigue.

## 2014-09-23 ENCOUNTER — Other Ambulatory Visit (INDEPENDENT_AMBULATORY_CARE_PROVIDER_SITE_OTHER): Payer: Medicare Other

## 2014-09-23 DIAGNOSIS — G039 Meningitis, unspecified: Secondary | ICD-10-CM | POA: Diagnosis not present

## 2014-09-23 DIAGNOSIS — R51 Headache: Secondary | ICD-10-CM

## 2014-09-23 DIAGNOSIS — E039 Hypothyroidism, unspecified: Secondary | ICD-10-CM

## 2014-09-23 DIAGNOSIS — R519 Headache, unspecified: Secondary | ICD-10-CM

## 2014-09-23 DIAGNOSIS — E038 Other specified hypothyroidism: Secondary | ICD-10-CM

## 2014-09-23 DIAGNOSIS — G8929 Other chronic pain: Secondary | ICD-10-CM

## 2014-09-23 LAB — COMPREHENSIVE METABOLIC PANEL
ALK PHOS: 64 U/L (ref 39–117)
ALT: 41 U/L — AB (ref 0–35)
AST: 29 U/L (ref 0–37)
Albumin: 4.1 g/dL (ref 3.5–5.2)
BILIRUBIN TOTAL: 0.4 mg/dL (ref 0.2–1.2)
BUN: 13 mg/dL (ref 6–23)
CALCIUM: 9.6 mg/dL (ref 8.4–10.5)
CHLORIDE: 105 meq/L (ref 96–112)
CO2: 28 mEq/L (ref 19–32)
CREATININE: 0.88 mg/dL (ref 0.40–1.20)
GFR: 72.77 mL/min (ref >60.00)
Glucose, Bld: 104 mg/dL — ABNORMAL HIGH (ref 70–99)
Potassium: 3.6 mEq/L (ref 3.5–5.1)
SODIUM: 139 meq/L (ref 135–145)
TOTAL PROTEIN: 7.1 g/dL (ref 6.0–8.3)

## 2014-09-23 LAB — CORTISOL: CORTISOL PLASMA: 18 ug/dL

## 2014-09-23 LAB — TSH: TSH: 0.13 u[IU]/mL — ABNORMAL LOW (ref 0.35–4.50)

## 2014-09-23 LAB — T4, FREE: Free T4: 0.69 ng/dL (ref 0.60–1.60)

## 2014-09-23 LAB — FOLLICLE STIMULATING HORMONE: FSH: 4.5 m[IU]/mL

## 2014-09-24 LAB — PROLACTIN: Prolactin: 22.5 ng/mL

## 2014-09-24 LAB — T3: T3, Total: 139.1 ng/dL (ref 80.0–204.0)

## 2014-09-24 LAB — ESTRADIOL: ESTRADIOL: 258.5 pg/mL

## 2014-09-25 ENCOUNTER — Encounter: Payer: Self-pay | Admitting: Family Medicine

## 2014-09-26 ENCOUNTER — Encounter: Payer: Self-pay | Admitting: Family Medicine

## 2014-09-28 LAB — INSULIN-LIKE GROWTH FACTOR
IGF-I, LC/MS: 73 ng/mL (ref 52–328)
Z-SCORE (FEMALE): -1.3 {STDV} (ref ?–2.0)

## 2014-10-01 ENCOUNTER — Other Ambulatory Visit: Payer: Self-pay | Admitting: Family Medicine

## 2014-10-01 ENCOUNTER — Ambulatory Visit
Admission: RE | Admit: 2014-10-01 | Discharge: 2014-10-01 | Disposition: A | Discharge status: Home / Self Care | Payer: Medicare Other | Source: Ambulatory Visit | Attending: Family Medicine | Admitting: Family Medicine

## 2014-10-01 DIAGNOSIS — R51 Headache: Secondary | ICD-10-CM | POA: Insufficient documentation

## 2014-10-01 DIAGNOSIS — R519 Headache, unspecified: Secondary | ICD-10-CM

## 2014-10-01 DIAGNOSIS — E039 Hypothyroidism, unspecified: Secondary | ICD-10-CM | POA: Diagnosis not present

## 2014-10-01 DIAGNOSIS — G8929 Other chronic pain: Secondary | ICD-10-CM

## 2014-10-02 ENCOUNTER — Encounter: Payer: Self-pay | Admitting: Family Medicine

## 2014-10-02 DIAGNOSIS — N319 Neuromuscular dysfunction of bladder, unspecified: Secondary | ICD-10-CM | POA: Insufficient documentation

## 2014-10-02 DIAGNOSIS — N3946 Mixed incontinence: Secondary | ICD-10-CM | POA: Insufficient documentation

## 2014-10-18 ENCOUNTER — Telehealth: Payer: Self-pay | Admitting: Internal Medicine

## 2014-10-18 NOTE — Telephone Encounter (Signed)
Sure

## 2014-10-21 ENCOUNTER — Encounter: Payer: Self-pay | Admitting: Family Medicine

## 2014-10-21 ENCOUNTER — Ambulatory Visit (INDEPENDENT_AMBULATORY_CARE_PROVIDER_SITE_OTHER): Payer: Medicare Other | Admitting: Family Medicine

## 2014-10-21 VITALS — BP 110/70 | HR 100 | Temp 98.1°F | Wt 212.5 lb

## 2014-10-21 DIAGNOSIS — B349 Viral infection, unspecified: Secondary | ICD-10-CM | POA: Diagnosis not present

## 2014-10-21 DIAGNOSIS — J029 Acute pharyngitis, unspecified: Secondary | ICD-10-CM

## 2014-10-21 DIAGNOSIS — L989 Disorder of the skin and subcutaneous tissue, unspecified: Secondary | ICD-10-CM

## 2014-10-21 DIAGNOSIS — B09 Unspecified viral infection characterized by skin and mucous membrane lesions: Secondary | ICD-10-CM

## 2014-10-21 DIAGNOSIS — K1379 Other lesions of oral mucosa: Secondary | ICD-10-CM | POA: Diagnosis not present

## 2014-10-21 LAB — POCT RAPID STREP A (OFFICE): Rapid Strep A Screen: NEGATIVE

## 2014-10-21 MED ORDER — THYROID 90 MG PO TABS: 90.0000 mg | ORAL_TABLET | Freq: Every day | ORAL | Status: DC

## 2014-10-21 NOTE — Progress Notes (Signed)
BP 110/70 mmHg  Pulse 100  Temp(Src) 98.1 F (36.7 C) (Oral)  Wt 212 lb 8 oz (96.389 kg)  LMP 10/04/2014   CC: ST with difficulty swallowing Subjective:    Patient ID: Karen Aguilar, female    DOB: 1965-08-22, 49 y.o.   MRN: [id number]  HPI: Karen Aguilar is a 49 y.o. female presenting on 10/21/2014 for Sore Throat   Trouble swallowing pills over last 1+ week. Also trouble with solid foods. Only able to eat yogurt/drink tea. Saw pain management yesterday who found ulcers with red base in back of throat - he was concerned and recommended f/u with me or GI. She takes dexilant 60mg  BID regularly for the past year - decreased to QD over last 3 months. Also takes tums prn. She does feel more rundown, malaise, fatigue and myalgias over last [redacted] week along with oral lesions  Has had aphthous ulcers in the past but never as bad as today.   No viral sxs - congestion, sneezing or coughing. No fevers/chills.   Currently in transition from Dr Deatra Ina to new GI - wants to see Dr Henrene Pastor (mother sees him).   Relevant past medical, surgical, family and social history reviewed and updated as indicated. Interim medical history since our last visit reviewed. Allergies and medications reviewed and updated. Current Outpatient Prescriptions on File Prior to Visit  Medication Sig  . ALPRAZolam (XANAX) 0.5 MG tablet TAKE 1 TABLET BY MOUTH THREE DAILY AS NEEDED.  . Ascorbic Acid (VITAMIN C PO) Take 1 tablet by mouth as needed.   . Cholecalciferol (VITAMIN D3) 1000 UNITS CAPS Take 1 capsule (1,000 Units total) by mouth daily.  Marland Kitchen dexlansoprazole (DEXILANT) 60 MG capsule Take 1 capsule (60 mg total) by mouth 2 (two) times daily.  Marland Kitchen dronabinol (MARINOL) 2.5 MG capsule as needed.   Marland Kitchen escitalopram (LEXAPRO) 10 MG tablet Take 1 tablet (10 mg total) by mouth daily.  . hydrocortisone (PROCTOSOL HC) 2.5 % rectal cream Place 1 application rectally 2 (two) times daily.  . hyoscyamine (LEVSIN SL) 0.125 MG SL tablet  PLACE 2 TABLETS UNDER THE TONGUE EVERY 4 HOURS AS NEEDED  . Misc Natural Products (TART CHERRY ADVANCED PO) Take by mouth daily. Pt takes [employer], 2 tablespoons, once a day for osteoarthritis  . Nutritional Supplements (NUTRITIONAL SUPPLEMENT PO) Take 1 tablet by mouth daily. Pt takes Cartalast Supplement, 1 tablet, orally, every night for osteoarthritis  . SYNTHROID 75 MCG tablet Take 75 mcg by mouth daily before breakfast. Take one daily and only one-half twice weekly  . Tapentadol HCl (NUCYNTA) 100 MG TABS Take 1 tablet by mouth every 4 (four) hours.  . topiramate (TOPAMAX) 100 MG tablet TAKE 1 TABLET BY MOUTH EVERY DAY   No current facility-administered medications on file prior to visit.    Review of Systems Per HPI unless specifically indicated above     Objective:    BP 110/70 mmHg  Pulse 100  Temp(Src) 98.1 F (36.7 C) (Oral)  Wt 212 lb 8 oz (96.389 kg)  LMP 10/04/2014  Wt Readings from Last 3 Encounters:  10/21/14 212 lb 8 oz (96.389 kg)  10/01/14 212 lb (96.163 kg)  09/22/14 212 lb (96.163 kg)    Physical Exam  Constitutional: She appears well-developed and well-nourished. No distress.  HENT:  Head: Normocephalic and atraumatic.  Nose: No mucosal edema or rhinorrhea.  Mouth/Throat: Uvula is midline, oropharynx is clear and moist and mucous membranes are normal. Abnormal dentition. No  oropharyngeal exudate, posterior oropharyngeal edema or posterior oropharyngeal erythema.  2 small ulcers soft palate on erythematous base, no fever blisters or other aphthous ulcers, no exudate, gum disease present throughout molars, no white film  Eyes: Conjunctivae and EOM are normal. Pupils are equal, round, and reactive to light. No scleral icterus.  Neck: Normal range of motion. Neck supple.  Lymphadenopathy:    She has no cervical adenopathy.  Skin: Skin is warm and dry. No rash noted.  No skin rash, no rash on hands/feet  Nursing note and vitals reviewed.  Results for  orders placed or performed in visit on 10/21/14  POCT rapid strep A  Result Value Ref Range   Rapid Strep A Screen Negative Negative      Assessment & Plan:   Problem List Items Addressed This Visit    Viral enanthem of mouth - Primary    Anticipate viral enanthem given associated symptoms of malaise, run down over last week. Discussed this, rec continued supportive care with soft foods, tea, yogurt. Update if persistent sxs into next week. Pt agrees with plan.      Skin lesions    Pt states we were unable to get her set up with derm when referred over 6 months ago. I have asked her to pass by up front to discuss with our referral coordinators.       Other Visit Diagnoses    Sore throat        Relevant Orders    POCT rapid strep A (Completed)        Follow up plan: No Follow-up on file.

## 2014-10-21 NOTE — Assessment & Plan Note (Signed)
Anticipate viral enanthem given associated symptoms of malaise, run down over last week. Discussed this, rec continued supportive care with soft foods, tea, yogurt. Update if persistent sxs into next week. Pt agrees with plan.

## 2014-10-21 NOTE — Patient Instructions (Addendum)
Pass by our referral coordinator to discuss dermatology referral from March 2016! I think this is more a viral illness - continue doing what you're doing - yogurt, tea with lemon and honey, should improve over next few days. Let us know if it doesn't.

## 2014-10-21 NOTE — Assessment & Plan Note (Signed)
Pt states we were unable to get her set up with derm when referred over 6 months ago. I have asked her to pass by up front to discuss with our referral coordinators.

## 2014-10-21 NOTE — Progress Notes (Signed)
Pre visit review using our clinic review tool, if applicable. No additional management support is needed unless otherwise documented below in the visit note. 

## 2014-11-03 NOTE — Telephone Encounter (Signed)
Spoke w/pt and sch'd appt w/Dr. Henrene Pastor for 12-01-14

## 2014-12-01 ENCOUNTER — Encounter: Payer: Self-pay | Admitting: Internal Medicine

## 2014-12-01 ENCOUNTER — Ambulatory Visit (INDEPENDENT_AMBULATORY_CARE_PROVIDER_SITE_OTHER): Payer: Medicare Other | Admitting: Internal Medicine

## 2014-12-01 VITALS — BP 134/72 | HR 82 | Ht 67.5 in | Wt 211.6 lb

## 2014-12-01 DIAGNOSIS — K219 Gastro-esophageal reflux disease without esophagitis: Secondary | ICD-10-CM | POA: Diagnosis not present

## 2014-12-01 DIAGNOSIS — R1310 Dysphagia, unspecified: Secondary | ICD-10-CM | POA: Diagnosis not present

## 2014-12-01 DIAGNOSIS — K222 Esophageal obstruction: Secondary | ICD-10-CM | POA: Diagnosis not present

## 2014-12-01 DIAGNOSIS — K582 Mixed irritable bowel syndrome: Secondary | ICD-10-CM

## 2014-12-01 DIAGNOSIS — K3184 Gastroparesis: Secondary | ICD-10-CM

## 2014-12-01 NOTE — Patient Instructions (Signed)
You have been scheduled for an endoscopy. Please follow written instructions given to you at your visit today. If you use inhalers (even only as needed), please bring them with you on the day of your procedure.   

## 2014-12-01 NOTE — Progress Notes (Signed)
HISTORY OF PRESENT ILLNESS:  Karen Aguilar is a 49 y.o. female with complicated medical history including chronic pain issues, functional diagnoses including fibromyalgia and irritable bowel syndrome, and anxiety/depression. Had been followed by Dr. Deatra Ina for IBS, narcotic related gastroparesis, and GERD complicated by peptic stricture requiring esophageal dilation. She self referred today after requesting that I assume her GI care upon Dr. Kelby Fam relocation. The chief complaint today is dysphagia. She describes developing pain with swallowing proximal 6 weeks ago for which she saw her PCP. She was found to have mouth ulcers. The patient thought this may be related to GERD. She had been taking Dexilant twice daily for control of GERD symptoms. At one point she decrease this to once daily. She describes "choking and drrowning "at night. Resumed twice a day dose. Odynophagia has resolved. She continues with intermittent solid food and pill dysphagia. She did undergo upper endoscopy with [facility] dilation to 32 French March 2015. She states this helped for similar symptoms. GI review of systems is remarkable for multiple additional complaints including abdominal pain, Lodine, gas, loss of appetite, weight gain, nausea, bowel habit change, fecal incontinence, hemorrhoids, and irritable bowel syndrome.  REVIEW OF SYSTEMS:  All non-GI ROS negative except for sinus and allergy, anxiety, arthritis, chronic back pain, visual change, confusion, cough, depression, fatigue, headaches, tinnitus, itching, menstrual pains, muscle cramps, night sweats, insomnia, sore throat, excessive urination, urinary frequency, urinary leakage, voice change, arthritis  Past Medical History  Diagnosis Date  . PONV (postoperative nausea and vomiting)   . Hypothyroidism 1990s    hashimoto's thyroiditis  . Anemia     etiology unknown-iron infusion in past,last 9/12  . Fibromyalgia   . Headache(784.0)     migraines  .  Incontinence of urine     s/p sling procedure-unresolved  . GERD (gastroesophageal reflux disease)     rare zantac PRN  . Chronic pain     pain contract with Dr. Eartha Inch Pain  . Sleep apnea     mild-doesn't use cpap  . Depression with anxiety   . 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 Hyperlipidemia   . Migraines     and h/o optical migraine  . Vitamin D deficiency   . Iron deficiency     h/o anemia, s/p iron infusion (2012)  . DDD (degenerative disc disease), lumbar     s/p permanent nerve damage after back surgery  . DDD (degenerative disc disease), cervical   . Neuropathy of lower extremity 2011  . Hemorrhoid   . Anxiety and depression   . Arthritis   . Diverticulosis   . Gallstones   . IBS (irritable bowel syndrome)   . Nephrolithiasis 2014    L 52mm stone without hydro by [place], has never passed stones  . Pulmonary nodule 04/2012    7.75mm RLL nodule - resolved on imaging 12/2012  . Abnormal MRI scan, bone 2012    abnormal marrow signal humeral head, glenoid and scapula - diffuse replacement of fatty marrow - likely benign, no need for further investigation ([facility])  . Migraines   . Osteoarthritis 12/2013  . GERD (gastroesophageal reflux disease)   . Hemorrhoids   . Neurogenic bladder 2008    after 13 spine surgeries with periph neuropathy and chronic LBP  . Mixed urge and stress incontinence 2008    extensive workup including urodynamics, failed miltiple anticholinergics including myrbetriq and urogesic blue s/p PTNS, normal cystoscopy 2014, seen by Dr Matilde Sprang and Dr  Erlene Quan  . Eczema   . Notalgia paresthetica     Past Surgical History  Procedure Laterality Date  . Lasik Bilateral   . Cholecystectomy  2001  . Pubovaginal sling  2008    midurethral sling  . Shoulder closed reduction Left 12/18/2010    Procedure: CLOSED MANIPULATION SHOULDER;  Surgeon: Johnn Hai; labrial debridement  . Back surgery       multiple-L4-s1,hardware,fusion,removal,infection s/p 5/11 procedure  . Back surgery  02/16/11    "my 7th back OR; Procedure: Exploration of fusion removal of hardware L4-S1, harvesting of right iliac crest bone graft, redo posterior lateral fusion L4-5 using iliac crest bone graft BMP and master graft replacement of bilateral L4 screws with a Telfa tech 7.5 x 40 mm screw on the right and 8.5 x 40 mm screw on the left placement of a large Hemovac drain  . Anterior cervical decomp/discectomy fusion      C3, 4, 5 - improved after surgery  . Cesarean section  1996; 2001    x 2  . Tonsillectomy      as a child; "then they grew back"  . Lumbar wound debridement  03/15/2011    Procedure: LUMBAR WOUND DEBRIDEMENT;  Surgeon: Elaina Hoops, MD;  Lumbar Wound Debridement  . Spine surgery    . Pft  01/28/2013    WNL  . Esophagogastroduodenoscopy  03/2013    early esophageal stricture dilated, o/w WNL Deatra Ina)    Social History Karen Aguilar  reports that she has never smoked. She has never used smokeless tobacco. She reports that she does not drink alcohol or use illicit drugs.  family history includes Alzheimer's disease in her maternal grandmother; Breast cancer in her maternal aunt; CAD in her maternal grandfather; Cancer in her other; Colon cancer in her paternal grandmother; Diabetes in her brother and daughter; Dysphagia in her mother; Heart attack in her maternal grandfather; Heart disease in her maternal grandfather; Irritable bowel syndrome in her mother; Lung cancer in her paternal grandfather; Lung cancer (age of onset: 85) in her father; Thyroid disease in her cousin and mother.  Allergies  Allergen Reactions  . Erythromycin Nausea And Vomiting  . Thimerosal     Local reaction-redness,swelling; veins popping  . Adhesive [Tape]     blisters  . Augmentin [Amoxicillin-Pot Clavulanate]     Lip swelling and irritation.   . Gabapentin Other (See Comments)    Feels like zombie  . Influenza  Vaccines Hives  . Levothyroxine Hives  . Lyrica [Pregabalin] Other (See Comments)    Feels like zombie  . Azithromycin Rash  . Macrolides And Ketolides Rash  . Sulfa Antibiotics Rash       PHYSICAL EXAMINATION: Vital signs: BP 134/72 mmHg  Pulse 82  Ht 5' 7.5" (1.715 m)  Wt 211 lb 9.6 oz (95.981 kg)  BMI 32.63 kg/m2  Constitutional: Slow moving with cane, no acute distress Psychiatric: alert and oriented x3, cooperative, but depressed-appearing Eyes: extraocular movements intact, anicteric, conjunctiva pink Mouth: oral pharynx moist, no lesions Neck: supple without thyromegaly Lymph: no lymphadenopathy Cardiovascular: heart regular rate and rhythm, no murmur Lungs: clear to auscultation bilaterally Abdomen: soft, obese, nontender, nondistended, no obvious ascites, no peritoneal signs, normal bowel sounds, no organomegaly Rectal: Omitted Extremities: no clubbing cyanosis or lower extremity edema bilaterally Skin: no lesions on visible extremities Neuro: No focal deficits. Diminished DTRs.   ASSESSMENT:  #1. Chronic GERD  #2. Intermittent solid food dysphagia likely due to recurrent peptic stricture #  3. Recent problem with mouth ulcers unrelated to GERD. Resolved #4. History of IBS and narcotic related gastroparesis #5. Multiple medical problems, chronic pain issues, anxiety/depression   PLAN:  #1. Reflux precautions with attention to weight loss #2. Continue PPI. Lowest dose to control symptoms #3. Upper endoscopy with esophageal dilation.The nature of the procedure, as well as the risks, benefits, and alternatives were carefully and thoroughly reviewed with the patient. Ample time for discussion and questions allowed. The patient understood, was satisfied, and agreed to proceed.

## 2014-12-02 ENCOUNTER — Encounter: Payer: Self-pay | Admitting: Family Medicine

## 2014-12-02 DIAGNOSIS — E039 Hypothyroidism, unspecified: Secondary | ICD-10-CM

## 2014-12-02 HISTORY — PX: ESOPHAGOGASTRODUODENOSCOPY: SHX1529

## 2014-12-02 NOTE — Telephone Encounter (Signed)
Please see Mychart message.

## 2014-12-07 ENCOUNTER — Other Ambulatory Visit (INDEPENDENT_AMBULATORY_CARE_PROVIDER_SITE_OTHER): Payer: Medicare Other

## 2014-12-07 DIAGNOSIS — E039 Hypothyroidism, unspecified: Secondary | ICD-10-CM

## 2014-12-07 LAB — T4, FREE: FREE T4: 0.79 ng/dL (ref 0.60–1.60)

## 2014-12-07 LAB — TSH: TSH: 0.03 u[IU]/mL — ABNORMAL LOW (ref 0.35–4.50)

## 2014-12-08 LAB — T3: T3, Total: 120.8 ng/dL (ref 80.0–204.0)

## 2014-12-09 ENCOUNTER — Encounter: Payer: Self-pay | Admitting: Family Medicine

## 2014-12-10 MED ORDER — SYNTHROID 50 MCG PO TABS: 50.0000 ug | ORAL_TABLET | Freq: Every day | ORAL | Status: DC

## 2014-12-10 NOTE — Addendum Note (Signed)
Addended by: Ria Bush on: 12/10/2014 12:40 AM   Modules accepted: Orders

## 2014-12-10 NOTE — Telephone Encounter (Signed)
plz schedule f/u labs in 2 months

## 2014-12-14 ENCOUNTER — Encounter: Payer: Self-pay | Admitting: Internal Medicine

## 2014-12-14 ENCOUNTER — Ambulatory Visit (AMBULATORY_SURGERY_CENTER): Payer: Medicare Other | Admitting: Internal Medicine

## 2014-12-14 VITALS — BP 110/59 | HR 73 | Temp 98.7°F | Resp 23 | Ht 67.5 in | Wt 211.0 lb

## 2014-12-14 DIAGNOSIS — R1310 Dysphagia, unspecified: Secondary | ICD-10-CM

## 2014-12-14 DIAGNOSIS — K219 Gastro-esophageal reflux disease without esophagitis: Secondary | ICD-10-CM | POA: Diagnosis not present

## 2014-12-14 MED ORDER — SODIUM CHLORIDE 0.9 % IV SOLN: 500.0000 mL | INTRAVENOUS | Status: DC

## 2014-12-14 NOTE — Op Note (Signed)
[place]  [address] [place], [id number]   ENDOSCOPY PROCEDURE REPORT  PATIENT: Synai, Magner  MR#: [id number] BIRTHDATE: [birth year] , 45  yrs. old GENDER: female ENDOSCOPIST: Eustace Quail, MD REFERRED BY:  .  Self / Office PROCEDURE DATE:  12/14/2014 PROCEDURE:  EGD, diagnostic and Maloney dilation of esophagus   - 64f ASA CLASS:     Class III INDICATIONS:  dysphagia. MEDICATIONS: Monitored anesthesia care and Propofol 150 mg IV TOPICAL ANESTHETIC: none  DESCRIPTION OF PROCEDURE: After the risks benefits and alternatives of the procedure were thoroughly explained, informed consent was obtained.  The [id number] endoscope was introduced through the mouth and advanced to the second portion of the duodenum , Without limitations.  The instrument was slowly withdrawn as the mucosa was fully examined.    EXAM: The esophagus and gastroesophageal junction were completely normal in appearance.  The stomach was entered and closely examined.The antrum, angularis, and lesser curvature were well visualized, including a retroflexed view of the cardia and fundus. The stomach wall was normally distensable.  The scope passed easily through the pylorus into the duodenum.  Retroflexed views revealed no abnormalities.     The scope was then withdrawn from the patient and the procedure completed. THERAPY: 54 French Maloney dilator was passed into the esophagus without resistance or heme. Tolerated well  COMPLICATIONS: There were no immediate complications.  ENDOSCOPIC IMPRESSION: 1. Normal EGD 2. Dysphagia. Status post empiric dilation of esophagus. 57 French   RECOMMENDATIONS: 1.  Clear liquids until 4 pm, then soft foods rest of day.  Resume prior diet tomorrow. 2.  Continue current medications 3.  Return to the care of your primary care provider. GI follow-up as needed  REPEAT EXAM:  eSigned:  Eustace Quail, MD 12/14/2014 2:06  PM    CC:The Patient and Ria Bush MD

## 2014-12-14 NOTE — Patient Instructions (Signed)
YOU HAD AN ENDOSCOPIC PROCEDURE TODAY AT [facility]:   Refer to the procedure report that was given to you for any specific questions about what was found during the examination.  If the procedure report does not answer your questions, please call your gastroenterologist to clarify.  If you requested that your care partner not be given the details of your procedure findings, then the procedure report has been included in a sealed envelope for you to review at your convenience later.  YOU SHOULD EXPECT: Some feelings of bloating in the abdomen. Passage of more gas than usual.  Walking can help get rid of the air that was put into your GI tract during the procedure and reduce the bloating. If you had a lower endoscopy (such as a colonoscopy or flexible sigmoidoscopy) you may notice spotting of blood in your stool or on the toilet paper. If you underwent a bowel prep for your procedure, you may not have a normal bowel movement for a few days.  Please Note:  You might notice some irritation and congestion in your nose or some drainage.  This is from the oxygen used during your procedure.  There is no need for concern and it should clear up in a day or so.  SYMPTOMS TO REPORT IMMEDIATELY:     Following upper endoscopy (EGD)  Vomiting of blood or coffee ground material  New chest pain or pain under the shoulder blades  Painful or persistently difficult swallowing  New shortness of breath  Fever of 100F or higher  Black, tarry-looking stools  For urgent or emergent issues, a gastroenterologist can be reached at any hour by calling [phone].   DIET: liquids only until 4pm today ,then soft foods the rest of today    Resume usual diet tomorrow if tolerated              ACTIVITY:  You should plan to take it easy for the rest of today and you should NOT DRIVE or use heavy machinery until tomorrow (because of the sedation medicines used during the test).    FOLLOW UP: Our staff  will call the number listed on your records the next business day following your procedure to check on you and address any questions or concerns that you may have regarding the information given to you following your procedure. If we do not reach you, we will leave a message.  However, if you are feeling well and you are not experiencing any problems, there is no need to return our call.  We will assume that you have returned to your regular daily activities without incident.  If any biopsies were taken you will be contacted by phone or by letter within the next 1-3 weeks.  Please call us at [phone] if you have not heard about the biopsies in 3 weeks.    SIGNATURES/CONFIDENTIALITY: You and/or your care partner have signed paperwork which will be entered into your electronic medical record.  These signatures attest to the fact that that the information above on your After Visit Summary has been reviewed and is understood.  Full responsibility of the confidentiality of this discharge information lies with you and/or your care-partner.   Follow dilatation diet given to you today

## 2014-12-14 NOTE — Progress Notes (Signed)
Called into room after dilation was complete.

## 2014-12-14 NOTE — Progress Notes (Signed)
  [facility] Post-op Note  Patient: Karen Aguilar  Procedure(s) Performed: endoscopy with dilatation  Patient Location: [facility] - Recovery Area  Anesthesia Type: Deep Sedation/Propofol  Level of Consciousness: awake, oriented and patient cooperative  Airway and Oxygen Therapy: Patient Spontanous Breathing  Post-op Pain: none  Post-op Assessment:  Post-op Vital signs reviewed, Patient's Cardiovascular Status Stable, Respiratory Function Stable, Patent Airway, No signs of Nausea or vomiting and Pain level controlled  Post-op Vital Signs: Reviewed and stable  Complications: No apparent anesthesia complications  Ilia Dimaano E 2:09 PM

## 2014-12-15 ENCOUNTER — Telehealth: Payer: Self-pay | Admitting: *Deleted

## 2014-12-15 NOTE — Telephone Encounter (Signed)
Message left for patient to return my call and schedule lab appt for February 2017.

## 2014-12-15 NOTE — Telephone Encounter (Signed)
  Follow up Call-  Call back number 12/14/2014 03/02/2013  Post procedure Call Back phone  # [phone]  Permission to leave phone message Yes Yes     Patient questions:  Do you have a fever, pain , or abdominal swelling? No. Pain Score  0 *  Have you tolerated food without any problems? Yes.    Have you been able to return to your normal activities? Yes.    Do you have any questions about your discharge instructions: Diet   No. Medications  No. Follow up visit  No.  Do you have questions or concerns about your Care? No.  Actions: * If pain score is 4 or above: No action needed, pain <4.  Pt. Stated that she had a little bit of a sore throat,but she was able to eat and that her swallowing was better, made pt. Aware that she could do some warm salt water gargles or suck on cough drops to help her with the soreness,also instructed that if she has any problems to give [place] a call back pt. Verbalize understanding.

## 2014-12-23 ENCOUNTER — Encounter: Payer: Self-pay | Admitting: Family Medicine

## 2014-12-24 ENCOUNTER — Encounter: Payer: Self-pay | Admitting: Internal Medicine

## 2014-12-25 ENCOUNTER — Other Ambulatory Visit: Payer: Self-pay | Admitting: Family Medicine

## 2015-02-14 ENCOUNTER — Other Ambulatory Visit: Payer: Self-pay | Admitting: Family Medicine

## 2015-02-14 ENCOUNTER — Telehealth: Payer: Self-pay | Admitting: Family Medicine

## 2015-02-14 ENCOUNTER — Ambulatory Visit: Payer: Medicare Other | Admitting: Internal Medicine

## 2015-02-14 DIAGNOSIS — E039 Hypothyroidism, unspecified: Secondary | ICD-10-CM

## 2015-02-14 NOTE — Telephone Encounter (Signed)
Patient Name: Karen Aguilar  DOB: [birth year]    Initial Comment Caller states she had her tear duct infection- starting to move down her face- can not breathe out of her left side of her face- numbness on left side of face.    Nurse Assessment  Nurse: Raphael Gibney, RN, Vanita Ingles Date/Time (Eastern Time): 02/14/2015 9:55:03 AM  Confirm and document reason for call. If symptomatic, describe symptoms. You must click the next button to save text entered. ---Caller states she had cold symptoms and got an infection in her tear duct. then her eye swelled. Her face is now swollen, red and very painful. She can not breathe out of the left side of her nose. Started on Friday. No fever. Face is a little numb on that side.  Has the patient traveled out of the country within the last 30 days? ---No  Does the patient have any new or worsening symptoms? ---Yes  Will a triage be completed? ---Yes  Related visit to physician within the last 2 weeks? ---No  Does the PT have any chronic conditions? (i.e. diabetes, asthma, etc.) ---No  Is the patient pregnant or possibly pregnant? (Ask all females between the ages of 32-55) ---No  Is this a behavioral health or substance abuse call? ---No     Guidelines    Guideline Title Affirmed Question Affirmed Notes  Face Swelling [1] Swelling is red AND [2] very painful to touch    Final Disposition User   See Physician within 4 Hours (or PCP triage) Raphael Gibney, RN, Vera    Comments  there are no appts available at [facility] Office had already advised caller that there were also no appts at [place]. States she can not go to another office. Does not want to go to urgent care or the ER. She would like call back from office.   Referrals  GO TO FACILITY REFUSED   Disagree/Comply: Comply

## 2015-02-14 NOTE — Telephone Encounter (Signed)
Pt has appt 02/14/15 at 4:15 with Avie Echevaria NP.

## 2015-02-15 DIAGNOSIS — H0016 Chalazion left eye, unspecified eyelid: Secondary | ICD-10-CM | POA: Diagnosis not present

## 2015-02-16 ENCOUNTER — Other Ambulatory Visit (INDEPENDENT_AMBULATORY_CARE_PROVIDER_SITE_OTHER): Payer: Medicare Other

## 2015-02-16 DIAGNOSIS — E039 Hypothyroidism, unspecified: Secondary | ICD-10-CM

## 2015-02-16 LAB — T4, FREE: Free T4: 0.91 ng/dL (ref 0.60–1.60)

## 2015-02-16 LAB — T3, FREE: T3 FREE: 3.7 pg/mL (ref 2.3–4.2)

## 2015-02-16 LAB — TSH: TSH: 0.08 u[IU]/mL — ABNORMAL LOW (ref 0.35–4.50)

## 2015-02-17 DIAGNOSIS — M5136 Other intervertebral disc degeneration, lumbar region: Secondary | ICD-10-CM | POA: Diagnosis not present

## 2015-02-18 DIAGNOSIS — G894 Chronic pain syndrome: Secondary | ICD-10-CM | POA: Diagnosis not present

## 2015-02-18 DIAGNOSIS — Z79891 Long term (current) use of opiate analgesic: Secondary | ICD-10-CM | POA: Diagnosis not present

## 2015-02-18 DIAGNOSIS — M961 Postlaminectomy syndrome, not elsewhere classified: Secondary | ICD-10-CM | POA: Diagnosis not present

## 2015-02-18 DIAGNOSIS — M25559 Pain in unspecified hip: Secondary | ICD-10-CM | POA: Diagnosis not present

## 2015-02-19 ENCOUNTER — Encounter: Payer: Self-pay | Admitting: Family Medicine

## 2015-02-19 DIAGNOSIS — E039 Hypothyroidism, unspecified: Secondary | ICD-10-CM

## 2015-02-19 DIAGNOSIS — F418 Other specified anxiety disorders: Secondary | ICD-10-CM

## 2015-02-20 ENCOUNTER — Encounter: Payer: Self-pay | Admitting: Family Medicine

## 2015-02-20 NOTE — Telephone Encounter (Signed)
plz call to schedule endo appt

## 2015-03-08 DIAGNOSIS — E063 Autoimmune thyroiditis: Secondary | ICD-10-CM | POA: Diagnosis not present

## 2015-03-08 DIAGNOSIS — F4323 Adjustment disorder with mixed anxiety and depressed mood: Secondary | ICD-10-CM | POA: Diagnosis not present

## 2015-03-08 DIAGNOSIS — E038 Other specified hypothyroidism: Secondary | ICD-10-CM | POA: Diagnosis not present

## 2015-03-10 ENCOUNTER — Encounter: Payer: Self-pay | Admitting: Family Medicine

## 2015-03-10 ENCOUNTER — Other Ambulatory Visit: Payer: Self-pay | Admitting: Gastroenterology

## 2015-03-15 DIAGNOSIS — F4323 Adjustment disorder with mixed anxiety and depressed mood: Secondary | ICD-10-CM | POA: Diagnosis not present

## 2015-03-16 NOTE — Telephone Encounter (Addendum)
plz call pt's husband and schedule new pt appt with me. Would offer April 12th at noon. His cell phone number is [phone], and his name is Jimmy Picket.

## 2015-03-17 ENCOUNTER — Encounter: Payer: Self-pay | Admitting: Internal Medicine

## 2015-03-18 ENCOUNTER — Telehealth: Payer: Self-pay

## 2015-03-18 DIAGNOSIS — G894 Chronic pain syndrome: Secondary | ICD-10-CM | POA: Diagnosis not present

## 2015-03-18 DIAGNOSIS — Z79891 Long term (current) use of opiate analgesic: Secondary | ICD-10-CM | POA: Diagnosis not present

## 2015-03-18 DIAGNOSIS — M961 Postlaminectomy syndrome, not elsewhere classified: Secondary | ICD-10-CM | POA: Diagnosis not present

## 2015-03-18 DIAGNOSIS — M25559 Pain in unspecified hip: Secondary | ICD-10-CM | POA: Diagnosis not present

## 2015-03-18 MED ORDER — DEXLANSOPRAZOLE 60 MG PO CPDR: 1.0000 | DELAYED_RELEASE_CAPSULE | Freq: Two times a day (BID) | ORAL | Status: DC

## 2015-03-18 NOTE — Telephone Encounter (Signed)
Patient had concerns that her Dexilant was not being refilled.  I do not see any communication about this in her chart except for this most recent patient email.  Refilled Dexilant and will reply to patient's email.

## 2015-03-24 ENCOUNTER — Telehealth: Payer: Self-pay

## 2015-03-24 NOTE — Telephone Encounter (Signed)
Spoke with [employer] and pharmacy and determined that she needs a prior authorization for her Onida.  I will begin that process .

## 2015-03-29 NOTE — Telephone Encounter (Signed)
Prior authorization for Dexilant submitted to [employer]

## 2015-04-05 DIAGNOSIS — E038 Other specified hypothyroidism: Secondary | ICD-10-CM | POA: Diagnosis not present

## 2015-04-05 DIAGNOSIS — E063 Autoimmune thyroiditis: Secondary | ICD-10-CM | POA: Diagnosis not present

## 2015-04-07 ENCOUNTER — Telehealth: Payer: Self-pay | Admitting: Internal Medicine

## 2015-04-07 NOTE — Telephone Encounter (Signed)
Insurance company had additional questions regarding Dexilant prescription. Questions answered with Caryl Pina with insurance company.

## 2015-04-08 ENCOUNTER — Encounter: Payer: Self-pay | Admitting: Internal Medicine

## 2015-04-08 ENCOUNTER — Telehealth: Payer: Self-pay | Admitting: Internal Medicine

## 2015-04-08 NOTE — Telephone Encounter (Signed)
Caller name: Valerie Roys  Call back number: [phone]   Reason for call:   Calling to inform that Dexilant has been approved for 1 year - patient has been informed.

## 2015-04-08 NOTE — Telephone Encounter (Signed)
See patient email dated 04/08/15.

## 2015-04-14 ENCOUNTER — Ambulatory Visit (INDEPENDENT_AMBULATORY_CARE_PROVIDER_SITE_OTHER): Payer: Medicare Other | Admitting: Family Medicine

## 2015-04-14 ENCOUNTER — Encounter: Payer: Self-pay | Admitting: Family Medicine

## 2015-04-14 VITALS — BP 108/72 | HR 89 | Temp 98.5°F | Wt 213.5 lb

## 2015-04-14 DIAGNOSIS — J069 Acute upper respiratory infection, unspecified: Secondary | ICD-10-CM | POA: Diagnosis not present

## 2015-04-14 MED ORDER — ALBUTEROL SULFATE HFA 108 (90 BASE) MCG/ACT IN AERS: 1.0000 | INHALATION_SPRAY | Freq: Four times a day (QID) | RESPIRATORY_TRACT | Status: DC | PRN

## 2015-04-14 MED ORDER — DOXYCYCLINE HYCLATE 100 MG PO TABS: 100.0000 mg | ORAL_TABLET | Freq: Two times a day (BID) | ORAL | Status: DC

## 2015-04-14 NOTE — Progress Notes (Signed)
Pre visit review using our clinic review tool, if applicable. No additional management support is needed unless otherwise documented below in the visit note.  Sick for the last week.  First noted congestion, stuffiness, cough, facial pain.  Fever initially, can still feel hot in the meantime.  She feels some better today but still with cough and "crackling sound in my chest" today.   Sick contacts when she was visiting her husband at the hospital.  On baseline meds, including meds for chronic pain.   She has a trial for a spinal cord stimulator coming up.    She has used SABA prev with illnesses, not in the recent past, with some relief.    Meds, vitals, and allergies reviewed.   ROS: See HPI.  Otherwise, noncontributory.  GEN: nad, alert and oriented HEENT: mucous membranes moist, tm w/o erythema but B SOM noted, nasal exam w/o erythema, clear discharge noted,  OP with cobblestoning, sinuses not ttp NECK: supple w/o LA CV: rrr.   PULM: ctab, no inc wob EXT: no edema SKIN: no acute rash

## 2015-04-14 NOTE — Patient Instructions (Signed)
Use the inhaler in the meantime and then start the doxy if worse in the next few days.   Take care.  Glad to see you.

## 2015-04-17 DIAGNOSIS — J069 Acute upper respiratory infection, unspecified: Secondary | ICD-10-CM | POA: Insufficient documentation

## 2015-04-17 NOTE — Assessment & Plan Note (Signed)
Ctab, d/w pt.  Nontoxic.  She has used SABA prev with illnesses, not in the recent past, with some relief.  Use SABA for now, start doxy if worse in the next few days, though I expect her not to need it.  Update [place] as needed.  She agrees.

## 2015-04-29 DIAGNOSIS — G894 Chronic pain syndrome: Secondary | ICD-10-CM | POA: Diagnosis not present

## 2015-04-29 DIAGNOSIS — M25559 Pain in unspecified hip: Secondary | ICD-10-CM | POA: Diagnosis not present

## 2015-04-29 DIAGNOSIS — Z79891 Long term (current) use of opiate analgesic: Secondary | ICD-10-CM | POA: Diagnosis not present

## 2015-04-29 DIAGNOSIS — M961 Postlaminectomy syndrome, not elsewhere classified: Secondary | ICD-10-CM | POA: Diagnosis not present

## 2015-05-09 DIAGNOSIS — E063 Autoimmune thyroiditis: Secondary | ICD-10-CM | POA: Diagnosis not present

## 2015-05-09 DIAGNOSIS — E038 Other specified hypothyroidism: Secondary | ICD-10-CM | POA: Diagnosis not present

## 2015-05-10 DIAGNOSIS — N911 Secondary amenorrhea: Secondary | ICD-10-CM | POA: Diagnosis not present

## 2015-05-10 DIAGNOSIS — E063 Autoimmune thyroiditis: Secondary | ICD-10-CM | POA: Diagnosis not present

## 2015-05-10 DIAGNOSIS — E038 Other specified hypothyroidism: Secondary | ICD-10-CM | POA: Diagnosis not present

## 2015-05-23 ENCOUNTER — Other Ambulatory Visit: Payer: Self-pay | Admitting: Anesthesiology

## 2015-05-23 ENCOUNTER — Ambulatory Visit
Admission: RE | Admit: 2015-05-23 | Discharge: 2015-05-23 | Disposition: A | Discharge status: Home / Self Care | Payer: Medicare Other | Source: Ambulatory Visit | Attending: Anesthesiology | Admitting: Anesthesiology

## 2015-05-23 DIAGNOSIS — G894 Chronic pain syndrome: Secondary | ICD-10-CM | POA: Diagnosis not present

## 2015-05-23 DIAGNOSIS — Z9689 Presence of other specified functional implants: Secondary | ICD-10-CM

## 2015-05-23 DIAGNOSIS — M961 Postlaminectomy syndrome, not elsewhere classified: Secondary | ICD-10-CM | POA: Diagnosis not present

## 2015-05-23 DIAGNOSIS — Z462 Encounter for fitting and adjustment of other devices related to nervous system and special senses: Secondary | ICD-10-CM | POA: Diagnosis not present

## 2015-05-31 DIAGNOSIS — M5137 Other intervertebral disc degeneration, lumbosacral region: Secondary | ICD-10-CM | POA: Diagnosis not present

## 2015-06-07 ENCOUNTER — Encounter: Payer: Self-pay | Admitting: Internal Medicine

## 2015-06-08 ENCOUNTER — Other Ambulatory Visit: Payer: Self-pay

## 2015-06-08 ENCOUNTER — Other Ambulatory Visit: Payer: Self-pay | Admitting: Internal Medicine

## 2015-06-08 DIAGNOSIS — R1310 Dysphagia, unspecified: Secondary | ICD-10-CM

## 2015-06-08 MED ORDER — HYOSCYAMINE SULFATE 0.125 MG SL SUBL: 0.1250 mg | SUBLINGUAL_TABLET | SUBLINGUAL | Status: DC | PRN

## 2015-06-15 DIAGNOSIS — Z79891 Long term (current) use of opiate analgesic: Secondary | ICD-10-CM | POA: Diagnosis not present

## 2015-06-16 ENCOUNTER — Ambulatory Visit (HOSPITAL_COMMUNITY)
Admission: RE | Admit: 2015-06-16 | Discharge: 2015-06-16 | Disposition: A | Discharge status: Home / Self Care | Payer: Medicare Other | Source: Ambulatory Visit | Attending: Internal Medicine | Admitting: Internal Medicine

## 2015-06-16 ENCOUNTER — Encounter: Payer: Self-pay | Admitting: Family Medicine

## 2015-06-16 DIAGNOSIS — Z0182 Encounter for allergy testing: Secondary | ICD-10-CM

## 2015-06-16 DIAGNOSIS — K5710 Diverticulosis of small intestine without perforation or abscess without bleeding: Secondary | ICD-10-CM | POA: Insufficient documentation

## 2015-06-16 DIAGNOSIS — R1310 Dysphagia, unspecified: Secondary | ICD-10-CM | POA: Insufficient documentation

## 2015-06-16 NOTE — Addendum Note (Signed)
Addended by: Ria Bush on: 06/16/2015 06:06 PM   Modules accepted: Orders

## 2015-06-16 NOTE — Telephone Encounter (Signed)
plz set up allergy referral for patient - she would like expedited. Thanks.

## 2015-06-21 DIAGNOSIS — J309 Allergic rhinitis, unspecified: Secondary | ICD-10-CM | POA: Diagnosis not present

## 2015-06-21 DIAGNOSIS — K210 Gastro-esophageal reflux disease with esophagitis: Secondary | ICD-10-CM | POA: Diagnosis not present

## 2015-06-21 DIAGNOSIS — R21 Rash and other nonspecific skin eruption: Secondary | ICD-10-CM | POA: Diagnosis not present

## 2015-06-21 DIAGNOSIS — H1045 Other chronic allergic conjunctivitis: Secondary | ICD-10-CM | POA: Diagnosis not present

## 2015-06-23 DIAGNOSIS — J309 Allergic rhinitis, unspecified: Secondary | ICD-10-CM | POA: Diagnosis not present

## 2015-06-23 DIAGNOSIS — K210 Gastro-esophageal reflux disease with esophagitis: Secondary | ICD-10-CM | POA: Diagnosis not present

## 2015-06-23 DIAGNOSIS — H1045 Other chronic allergic conjunctivitis: Secondary | ICD-10-CM | POA: Diagnosis not present

## 2015-06-23 DIAGNOSIS — R21 Rash and other nonspecific skin eruption: Secondary | ICD-10-CM | POA: Diagnosis not present

## 2015-06-24 DIAGNOSIS — E038 Other specified hypothyroidism: Secondary | ICD-10-CM | POA: Diagnosis not present

## 2015-06-24 DIAGNOSIS — E063 Autoimmune thyroiditis: Secondary | ICD-10-CM | POA: Diagnosis not present

## 2015-06-27 DIAGNOSIS — R21 Rash and other nonspecific skin eruption: Secondary | ICD-10-CM | POA: Diagnosis not present

## 2015-06-27 DIAGNOSIS — H1045 Other chronic allergic conjunctivitis: Secondary | ICD-10-CM | POA: Diagnosis not present

## 2015-06-27 DIAGNOSIS — J309 Allergic rhinitis, unspecified: Secondary | ICD-10-CM | POA: Diagnosis not present

## 2015-06-27 DIAGNOSIS — K210 Gastro-esophageal reflux disease with esophagitis: Secondary | ICD-10-CM | POA: Diagnosis not present

## 2015-06-29 ENCOUNTER — Other Ambulatory Visit: Payer: Self-pay | Admitting: Internal Medicine

## 2015-07-06 DIAGNOSIS — E038 Other specified hypothyroidism: Secondary | ICD-10-CM | POA: Diagnosis not present

## 2015-07-06 DIAGNOSIS — N959 Unspecified menopausal and perimenopausal disorder: Secondary | ICD-10-CM | POA: Diagnosis not present

## 2015-07-06 DIAGNOSIS — E063 Autoimmune thyroiditis: Secondary | ICD-10-CM | POA: Diagnosis not present

## 2015-07-13 DIAGNOSIS — Z79891 Long term (current) use of opiate analgesic: Secondary | ICD-10-CM | POA: Diagnosis not present

## 2015-07-13 DIAGNOSIS — M961 Postlaminectomy syndrome, not elsewhere classified: Secondary | ICD-10-CM | POA: Diagnosis not present

## 2015-07-13 DIAGNOSIS — G894 Chronic pain syndrome: Secondary | ICD-10-CM | POA: Diagnosis not present

## 2015-07-13 DIAGNOSIS — M62831 Muscle spasm of calf: Secondary | ICD-10-CM | POA: Diagnosis not present

## 2015-07-18 ENCOUNTER — Ambulatory Visit: Payer: Self-pay | Admitting: Allergy and Immunology

## 2015-07-18 ENCOUNTER — Ambulatory Visit: Payer: Medicare Other | Admitting: Internal Medicine

## 2015-07-24 ENCOUNTER — Other Ambulatory Visit: Payer: Self-pay | Admitting: Family Medicine

## 2015-07-25 NOTE — Telephone Encounter (Signed)
Sent in

## 2015-07-27 DIAGNOSIS — M5126 Other intervertebral disc displacement, lumbar region: Secondary | ICD-10-CM | POA: Diagnosis not present

## 2015-07-27 DIAGNOSIS — M5136 Other intervertebral disc degeneration, lumbar region: Secondary | ICD-10-CM | POA: Diagnosis not present

## 2015-07-27 DIAGNOSIS — Z6831 Body mass index (BMI) 31.0-31.9, adult: Secondary | ICD-10-CM | POA: Diagnosis not present

## 2015-07-27 DIAGNOSIS — M25551 Pain in right hip: Secondary | ICD-10-CM | POA: Diagnosis not present

## 2015-07-27 DIAGNOSIS — M5416 Radiculopathy, lumbar region: Secondary | ICD-10-CM | POA: Diagnosis not present

## 2015-08-01 ENCOUNTER — Other Ambulatory Visit: Payer: Self-pay | Admitting: Neurosurgery

## 2015-08-10 DIAGNOSIS — M961 Postlaminectomy syndrome, not elsewhere classified: Secondary | ICD-10-CM | POA: Diagnosis not present

## 2015-08-10 DIAGNOSIS — M62831 Muscle spasm of calf: Secondary | ICD-10-CM | POA: Diagnosis not present

## 2015-08-10 DIAGNOSIS — Z79891 Long term (current) use of opiate analgesic: Secondary | ICD-10-CM | POA: Diagnosis not present

## 2015-08-10 DIAGNOSIS — G894 Chronic pain syndrome: Secondary | ICD-10-CM | POA: Diagnosis not present

## 2015-08-12 ENCOUNTER — Other Ambulatory Visit: Payer: Self-pay | Admitting: Neurosurgery

## 2015-08-15 ENCOUNTER — Ambulatory Visit (INDEPENDENT_AMBULATORY_CARE_PROVIDER_SITE_OTHER): Payer: Medicare Other | Admitting: Internal Medicine

## 2015-08-15 ENCOUNTER — Encounter: Payer: Self-pay | Admitting: Internal Medicine

## 2015-08-15 VITALS — BP 126/82 | HR 76 | Ht 67.0 in | Wt 207.1 lb

## 2015-08-15 DIAGNOSIS — K219 Gastro-esophageal reflux disease without esophagitis: Secondary | ICD-10-CM | POA: Diagnosis not present

## 2015-08-15 DIAGNOSIS — R1110 Vomiting, unspecified: Secondary | ICD-10-CM

## 2015-08-15 DIAGNOSIS — K3184 Gastroparesis: Secondary | ICD-10-CM

## 2015-08-15 DIAGNOSIS — R110 Nausea: Secondary | ICD-10-CM | POA: Diagnosis not present

## 2015-08-15 DIAGNOSIS — IMO0001 Reserved for inherently not codable concepts without codable children: Secondary | ICD-10-CM

## 2015-08-15 DIAGNOSIS — R112 Nausea with vomiting, unspecified: Secondary | ICD-10-CM

## 2015-08-15 NOTE — Progress Notes (Signed)
HISTORY OF PRESENT ILLNESS:  Karen Aguilar is a 50 y.o. female , previous patient of Dr. Deatra Ina who establish with me [id number], with a complicated medical history including chronic pain issues, functional diagnoses including fibromyalgia and irritable bowel syndrome, anxiety and depression, GERD with a history of peptic stricture requiring esophageal dilation, irritable bowel syndrome, and narcotic related gastroparesis. When I saw the patient she was complaining of intermittent solid food dysphagia. She also complained of mouth ulcers that were not felt to be related to GERD. These resolved. She subsequently underwent upper endoscopy 12/14/2014. This was normal. Esophagus was empirically dilated with 54 [place] Maloney dilator. She was continued on PPI therapy. Patient contacted the office recent months regarding Dexilant which she takes twice daily. Subsequently, complaints of difficulty with swallowing. We did set up a barium esophagram which was performed 06/16/2015. She was noted to have mild decrease in esophageal motility. No spontaneous reflux. No obstruction or stricture. Barium tablet passed. Incidental diverticula in the duodenum present. Her chief complaint today is worsening reflux despite Dexilant. She is tearful. She describes food sitting on her esophagus and significant problems with regurgitation. She reports weight loss. 4 pounds per the record. Still significantly overweight. He continues on chronic narcotics. Uses Marinol for nausea. Has plans for spinal cord stimulator placement in her back later this week. She is hopeful for production and narcotics. Currently takes Tapentadol 100 mg 3 times daily. Problems with abdominal bloating and belching. Alternating bowel habits continue. Her allergist suggested ruling out eosinophilic esophagitis. Chart alludes to colonoscopy in 2008 being normal but no report  REVIEW OF SYSTEMS:  All non-GI ROS negative except for anxiety, arthritis, back  pain, visual change, depression, itching, headaches, muscle cramps, night sweats, insomnia, excessive urination, urinary frequency, urinary leakage, voice change  Past Medical History:  Diagnosis Date  . Abnormal MRI scan, bone 2012   abnormal marrow signal humeral head, glenoid and scapula - diffuse replacement of fatty marrow - likely benign, no need for further investigation ([place])  . Anemia    etiology unknown-iron infusion in past,last 9/12  . Anxiety and depression   . 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 Arthritis   . Chronic pain    pain contract with Dr. Eartha Inch Pain  . DDD (degenerative disc disease), cervical   . DDD (degenerative disc disease), lumbar    s/p permanent nerve damage after back surgery  . Depression with anxiety   . Diverticulosis   . Eczema   . Fibromyalgia   . Gallstones   . GERD (gastroesophageal reflux disease)    rare zantac PRN  . GERD (gastroesophageal reflux disease)   . Headache(784.0)    migraines  . Hemorrhoid   . Hemorrhoids   . Hyperlipidemia   . Hypothyroidism 1990s   hashimoto's thyroiditis  . IBS (irritable bowel syndrome)   . Incontinence of urine    s/p sling procedure-unresolved  . Iron deficiency    h/o anemia, s/p iron infusion (2012)  . Migraines    and h/o optical migraine  . Migraines   . Mixed urge and stress incontinence 2008   extensive workup including urodynamics, failed miltiple anticholinergics including myrbetriq and urogesic blue s/p PTNS, normal cystoscopy 2014, seen by Dr Matilde Sprang and Dr Erlene Quan  . Nephrolithiasis 2014   L 58mm stone without hydro by [place], has never passed stones  . Neurogenic bladder 2008   after 13 spine surgeries with periph neuropathy and chronic LBP  . Neuropathy  of lower extremity 2011  . Notalgia paresthetica   . Osteoarthritis 12/2013  . PONV (postoperative nausea and vomiting)   . Pulmonary nodule 04/2012   7.37mm RLL nodule - resolved on imaging 12/2012  . Sleep  apnea    mild-doesn't use cpap  . Vitamin D deficiency     Past Surgical History:  Procedure Laterality Date  . ANTERIOR CERVICAL DECOMP/DISCECTOMY FUSION     C3, 4, 5 - improved after surgery  . BACK SURGERY     multiple-L4-s1,hardware,fusion,removal,infection s/p 5/11 procedure  . BACK SURGERY  02/16/11   "my 7th back OR; Procedure: Exploration of fusion removal of hardware L4-S1, harvesting of right iliac crest bone graft, redo posterior lateral fusion L4-5 using iliac crest bone graft BMP and master graft replacement of bilateral L4 screws with a Telfa tech 7.5 x 40 mm screw on the right and 8.5 x 40 mm screw on the left placement of a large Hemovac drain  . [place]; 2001   x 2  . CHOLECYSTECTOMY  2001  . ESOPHAGOGASTRODUODENOSCOPY  03/2013   early esophageal stricture dilated, o/w WNL Deatra Ina)  . ESOPHAGOGASTRODUODENOSCOPY  12/2014   WNL, s/p empiric dilation of esophagus Henrene Pastor)  . LASIK Bilateral   . LUMBAR WOUND DEBRIDEMENT  03/15/2011   Procedure: LUMBAR WOUND DEBRIDEMENT;  Surgeon: Elaina Hoops, MD;  Lumbar Wound Debridement  . PFT  01/28/2013   WNL  . PUBOVAGINAL SLING  2008   midurethral sling  . SHOULDER CLOSED REDUCTION Left 12/18/2010   Procedure: CLOSED MANIPULATION SHOULDER;  Surgeon: Johnn Hai; labrial debridement  . SPINE SURGERY    . TONSILLECTOMY     as a child; "then they grew back"    Social History Karen Aguilar  reports that she has never smoked. She has never used smokeless tobacco. She reports that she does not drink alcohol or use drugs.  family history includes Alzheimer's disease in her maternal grandmother; Breast cancer in her maternal aunt; CAD in her maternal grandfather; Cancer in her other; Colon cancer in her paternal grandmother; Diabetes in her brother and daughter; Dysphagia in her mother; Heart attack in her maternal grandfather; Heart disease in her maternal grandfather; Irritable bowel syndrome in her mother; Lung cancer  in her paternal grandfather; Lung cancer (age of onset: 21) in her father; Thyroid disease in her cousin and mother.  Allergies  Allergen Reactions  . Erythromycin Nausea And Vomiting  . Thimerosal     Local reaction-redness,swelling; veins popping  . Adhesive [Tape]     blisters  . Augmentin [Amoxicillin-Pot Clavulanate]     Lip swelling and irritation.   . Gabapentin Other (See Comments)    Feels like zombie  . Influenza Vaccines Hives  . Levothyroxine Hives  . Lyrica [Pregabalin] Other (See Comments)    Feels like zombie  . Azithromycin Rash  . Macrolides And Ketolides Rash  . Nickel Swelling and Rash  . Sulfa Antibiotics Rash       PHYSICAL EXAMINATION: Vital signs: BP 126/82 (BP Location: Left Arm, Patient Position: Sitting, Cuff Size: Normal)   Pulse 76   Ht 5\' 7"  (1.702 m) Comment: height measured without  Wt 207 lb 2 oz (94 kg)   LMP 10/04/2014   BMI 32.44 kg/m   Constitutional: Depressed appearing and tearful but overall generally well-appearing, no acute distress Psychiatric: alert and oriented x3, cooperative. Depressed Eyes: extraocular movements intact, anicteric, conjunctiva pink Mouth: oral pharynx moist, no lesions. No thrush Neck:  supple without thyromegaly Lymph: no lymphadenopathy Cardiovascular: heart regular rate and rhythm, no murmur Lungs: clear to auscultation bilaterally Abdomen: soft,Obese, nontender, nondistended, no obvious ascites, no peritoneal signs, normal bowel sounds, no organomegaly. No succussion splash Rectal:Omitted Extremities: no clubbing cyanosis or lower extremity edema bilaterally Skin: no lesions on visible extremities Neuro: No focal deficits. Cranial nerves intact  ASSESSMENT:  #1. The patient's symptoms are consistent with global GI dysmotility secondary to chronic narcotics. No obstructive process on most recent endoscopy or esophagram. Previously documented gastroparesis. Suspect ongoing. Issues are complicated by  anxiety and depression and ongoing pain syndrome. Not surprising that high-dose PPI does not address her issues with dysmotility and regurgitation. I explained to her that there are no available therapies to address these symptoms short of narcotic avoidance. She has a hard time excepting this fact. I will repeat her gastric emptying scan to document ongoing gastroparesis. Last exam 2015. As well, I am hopeful that she might have pain reduction with her spinal cord stimulator and that she will continues to work with her pain management physicians to get off narcotics. I'm confident that she would experience significant improvement in GI symptoms if she were able to illuminate narcotics. Again, I will leave this to her pain management specialist. In the interim, continue with reflux precautions and PPI therapy  25 minutes was spent face-to-face with the patient. The vast majority of the time was used for counseling regarding her GI dysmotility issues and recommendations as outlined

## 2015-08-15 NOTE — Patient Instructions (Signed)
You have been scheduled for a gastric emptying scan at [facility] on 09/19/2015 at 7:30am. Please arrive at least 15 minutes prior to your appointment for registration. Please make certain not to have anything to eat or drink after midnight the night before your test. Hold all stomach medications (ex: Zofran, phenergan, Reglan) 24 hours prior to your test. If you need to reschedule your appointment, please contact radiology scheduling at [phone]. _____________________________________________________________________ A gastric-emptying study measures how long it takes for food to move through your stomach. There are several ways to measure stomach emptying. In the most common test, you eat food that contains a small amount of radioactive material. A scanner that detects the movement of the radioactive material is placed over your abdomen to monitor the rate at which food leaves your stomach. This test normally takes about 4 hours to complete. _____________________________________________________________________

## 2015-08-16 ENCOUNTER — Encounter (HOSPITAL_COMMUNITY)
Admission: RE | Admit: 2015-08-16 | Discharge: 2015-08-16 | Disposition: A | Discharge status: Home / Self Care | Payer: Medicare Other | Source: Ambulatory Visit | Attending: Neurosurgery | Admitting: Neurosurgery

## 2015-08-16 ENCOUNTER — Encounter (HOSPITAL_COMMUNITY): Payer: Self-pay

## 2015-08-16 HISTORY — DX: Agnosia: R48.1

## 2015-08-16 HISTORY — DX: Nocturia: R35.1

## 2015-08-16 HISTORY — DX: Family history of other specified conditions: Z84.89

## 2015-08-16 HISTORY — DX: Hemangioma of intra-abdominal structures: D18.03

## 2015-08-16 HISTORY — DX: Frequency of micturition: R35.0

## 2015-08-16 HISTORY — DX: Other amnesia: R41.3

## 2015-08-16 HISTORY — DX: Personal history of urinary calculi: Z87.442

## 2015-08-16 HISTORY — DX: Foot drop, unspecified foot: M21.379

## 2015-08-16 LAB — CBC
HCT: 41.7 % (ref 36.0–46.0)
Hemoglobin: 13.5 g/dL (ref 12.0–15.0)
MCH: 28.5 pg (ref 26.0–34.0)
MCHC: 32.4 g/dL (ref 30.0–36.0)
MCV: 88 fL (ref 78.0–100.0)
PLATELETS: 242 10*3/uL (ref 150–400)
RBC: 4.74 MIL/uL (ref 3.87–5.11)
RDW: 13.2 % (ref 11.5–15.5)
WBC: 5.2 10*3/uL (ref 4.0–10.5)

## 2015-08-16 LAB — COMPREHENSIVE METABOLIC PANEL
ALBUMIN: 3.9 g/dL (ref 3.5–5.0)
ALT: 19 U/L (ref 14–54)
ANION GAP: 6 (ref 5–15)
AST: 14 U/L — ABNORMAL LOW (ref 15–41)
Alkaline Phosphatase: 60 U/L (ref 38–126)
BUN: 12 mg/dL (ref 6–20)
CO2: 26 mmol/L (ref 22–32)
Calcium: 9.2 mg/dL (ref 8.9–10.3)
Chloride: 107 mmol/L (ref 101–111)
Creatinine, Ser: 0.97 mg/dL (ref 0.44–1.00)
GFR calc Af Amer: >60 mL/min (ref >60)
GFR calc non Af Amer: >60 mL/min (ref >60)
GLUCOSE: 104 mg/dL — AB (ref 65–99)
POTASSIUM: 3.6 mmol/L (ref 3.5–5.1)
SODIUM: 139 mmol/L (ref 135–145)
TOTAL PROTEIN: 6.6 g/dL (ref 6.5–8.1)
Total Bilirubin: 0.2 mg/dL — ABNORMAL LOW (ref 0.3–1.2)

## 2015-08-16 LAB — SURGICAL PCR SCREEN
MRSA, PCR: NEGATIVE
STAPHYLOCOCCUS AUREUS: NEGATIVE

## 2015-08-16 MED ORDER — CHLORHEXIDINE GLUCONATE CLOTH 2 % EX PADS: 6.0000 | MEDICATED_PAD | Freq: Once | CUTANEOUS | Status: DC

## 2015-08-16 MED ORDER — VANCOMYCIN HCL IN DEXTROSE 1-5 GM/200ML-% IV SOLN: 1000.0000 mg | INTRAVENOUS | Status: DC

## 2015-08-16 NOTE — Pre-Procedure Instructions (Addendum)
    Karen Aguilar  08/16/2015    Your procedure is scheduled on Friday, August 18.  Report to [facility] Admitting at 12:50 P.M.           Your surgery or procedure is scheduled for 3:50 pm   Call this number if you have problems the morning of surgery:[phone]                 For any other questions, please call [phone], Monday - Friday 8 AM - 4 PM.   Remember:  Do not eat food or drink liquids after midnight Thursday, August 17.  Take these medicines the morning of surgery with A SIP OF WATER:baclofen (LIORESAL), dexlansoprazole (DEXILANT), escitalopram (LEXAPRO), Tapentadol HCl (NUCYNTA), thyroid (ARMOUR), topiramate (TOPAMAX).              Take take Zyrtec,dronabinol (MARINOL), ALPRAZolam Duanne Moron).   Do not wear jewelry, make-up or nail polish  Do not wear lotions, powders, or perfumes.   Do not shave 48 hours prior to surgery.    Do not bring valuables to the hospital.  [facility] is not responsible for any belongings or valuables.  Contacts, dentures or bridgework may not be worn into surgery.  Leave your suitcase in the car.  After surgery it may be brought to your room.  For patients admitted to the hospital, discharge time will be determined by your treatment team.  Please read over the following fact sheets that you were given. [facility]- Preparing For Surgery and Patient Instructions for Mupirocin Application

## 2015-08-16 NOTE — Progress Notes (Signed)
Information in chart list patient's last menstrual period as 10/2014. Mrs Karen Aguilar reports that testing with endocrinologist shows that patinet is pot menopausal. I requested labs and office notes from Dr Della Goo at [facility].

## 2015-08-19 ENCOUNTER — Encounter (HOSPITAL_COMMUNITY)
Admission: RE | Disposition: A | Discharge status: Home / Self Care | Payer: Self-pay | Source: Ambulatory Visit | Attending: Neurosurgery

## 2015-08-19 ENCOUNTER — Ambulatory Visit (HOSPITAL_COMMUNITY): Payer: Medicare Other

## 2015-08-19 ENCOUNTER — Ambulatory Visit (HOSPITAL_COMMUNITY): Payer: Medicare Other | Admitting: Anesthesiology

## 2015-08-19 ENCOUNTER — Inpatient Hospital Stay (HOSPITAL_COMMUNITY)
Admission: RE | Admit: 2015-08-19 | Discharge: 2015-08-20 | DRG: 520 | Disposition: A | Discharge status: Home / Self Care | Payer: Medicare Other | Source: Ambulatory Visit | Attending: Neurosurgery | Admitting: Neurosurgery

## 2015-08-19 ENCOUNTER — Encounter (HOSPITAL_COMMUNITY): Payer: Self-pay | Admitting: Anesthesiology

## 2015-08-19 DIAGNOSIS — F329 Major depressive disorder, single episode, unspecified: Secondary | ICD-10-CM | POA: Diagnosis not present

## 2015-08-19 DIAGNOSIS — F419 Anxiety disorder, unspecified: Secondary | ICD-10-CM | POA: Diagnosis present

## 2015-08-19 DIAGNOSIS — M5416 Radiculopathy, lumbar region: Secondary | ICD-10-CM | POA: Diagnosis not present

## 2015-08-19 DIAGNOSIS — M545 Low back pain: Secondary | ICD-10-CM | POA: Diagnosis present

## 2015-08-19 DIAGNOSIS — E039 Hypothyroidism, unspecified: Secondary | ICD-10-CM | POA: Diagnosis not present

## 2015-08-19 DIAGNOSIS — Z419 Encounter for procedure for purposes other than remedying health state, unspecified: Secondary | ICD-10-CM

## 2015-08-19 DIAGNOSIS — M797 Fibromyalgia: Secondary | ICD-10-CM | POA: Diagnosis not present

## 2015-08-19 DIAGNOSIS — G8929 Other chronic pain: Secondary | ICD-10-CM | POA: Diagnosis not present

## 2015-08-19 DIAGNOSIS — M5136 Other intervertebral disc degeneration, lumbar region: Secondary | ICD-10-CM | POA: Diagnosis not present

## 2015-08-19 DIAGNOSIS — M1990 Unspecified osteoarthritis, unspecified site: Secondary | ICD-10-CM | POA: Diagnosis not present

## 2015-08-19 DIAGNOSIS — G894 Chronic pain syndrome: Secondary | ICD-10-CM | POA: Diagnosis present

## 2015-08-19 DIAGNOSIS — Z981 Arthrodesis status: Secondary | ICD-10-CM

## 2015-08-19 HISTORY — PX: SPINAL CORD STIMULATOR INSERTION: SHX5378

## 2015-08-19 LAB — HCG, SERUM, QUALITATIVE: Preg, Serum: NEGATIVE

## 2015-08-19 SURGERY — INSERTION, SPINAL CORD STIMULATOR, LUMBAR
Anesthesia: General | Site: Spine Lumbar

## 2015-08-19 MED ORDER — ARTIFICIAL TEARS OP OINT
TOPICAL_OINTMENT | OPHTHALMIC | Status: AC
  Filled 2015-08-19: qty 7

## 2015-08-19 MED ORDER — CHLORHEXIDINE GLUCONATE CLOTH 2 % EX PADS: 6.0000 | MEDICATED_PAD | Freq: Once | CUTANEOUS | Status: DC

## 2015-08-19 MED ORDER — PANTOPRAZOLE SODIUM 40 MG PO TBEC
40.0000 mg | DELAYED_RELEASE_TABLET | Freq: Every day | ORAL | Status: DC
Start: 2015-08-19 — End: 2015-08-20
  Administered 2015-08-19 – 2015-08-20 (×2): 40 mg via ORAL
  Filled 2015-08-19 (×2): qty 1

## 2015-08-19 MED ORDER — TOPIRAMATE 100 MG PO TABS
100.0000 mg | ORAL_TABLET | Freq: Every day | ORAL | Status: DC
Start: 2015-08-19 — End: 2015-08-20
  Administered 2015-08-20: 100 mg via ORAL
  Filled 2015-08-19 (×2): qty 1

## 2015-08-19 MED ORDER — LORATADINE 10 MG PO TABS
10.0000 mg | ORAL_TABLET | Freq: Every day | ORAL | Status: DC
  Administered 2015-08-20: 10 mg via ORAL
  Filled 2015-08-19: qty 1

## 2015-08-19 MED ORDER — THROMBIN 5000 UNITS EX SOLR
CUTANEOUS | Status: DC | PRN
  Administered 2015-08-19: 5000 [IU] via TOPICAL

## 2015-08-19 MED ORDER — HYDROMORPHONE HCL 1 MG/ML IJ SOLN
0.5000 mg | INTRAMUSCULAR | Status: DC | PRN
  Administered 2015-08-19 – 2015-08-20 (×2): 1 mg via INTRAVENOUS
  Filled 2015-08-19 (×2): qty 1

## 2015-08-19 MED ORDER — ZOLPIDEM TARTRATE 5 MG PO TABS: 5.0000 mg | ORAL_TABLET | Freq: Every evening | ORAL | Status: DC | PRN

## 2015-08-19 MED ORDER — LIDOCAINE HCL (CARDIAC) 20 MG/ML IV SOLN
INTRAVENOUS | Status: DC | PRN
  Administered 2015-08-19: 50 mg via INTRAVENOUS

## 2015-08-19 MED ORDER — SODIUM CHLORIDE 0.9 % IJ SOLN
INTRAMUSCULAR | Status: AC
  Filled 2015-08-19: qty 10

## 2015-08-19 MED ORDER — FAMOTIDINE IN NACL 20-0.9 MG/50ML-% IV SOLN
20.0000 mg | Freq: Two times a day (BID) | INTRAVENOUS | Status: DC
  Filled 2015-08-19: qty 50

## 2015-08-19 MED ORDER — KCL IN DEXTROSE-NACL 20-5-0.45 MEQ/L-%-% IV SOLN: INTRAVENOUS | Status: DC

## 2015-08-19 MED ORDER — SODIUM CHLORIDE 0.9% FLUSH: 3.0000 mL | Freq: Two times a day (BID) | INTRAVENOUS | Status: DC

## 2015-08-19 MED ORDER — DIPHENHYDRAMINE HCL 50 MG/ML IJ SOLN
INTRAMUSCULAR | Status: AC
  Filled 2015-08-19: qty 2

## 2015-08-19 MED ORDER — SENNOSIDES-DOCUSATE SODIUM 8.6-50 MG PO TABS: 1.0000 | ORAL_TABLET | Freq: Every evening | ORAL | Status: DC | PRN

## 2015-08-19 MED ORDER — LACTATED RINGERS IV SOLN
INTRAVENOUS | Status: DC
  Administered 2015-08-19: 50 mL/h via INTRAVENOUS
  Administered 2015-08-19: 16:00:00 via INTRAVENOUS

## 2015-08-19 MED ORDER — ONDANSETRON HCL 4 MG/2ML IJ SOLN
INTRAMUSCULAR | Status: AC
  Filled 2015-08-19: qty 4

## 2015-08-19 MED ORDER — SODIUM CHLORIDE 0.9% FLUSH: 3.0000 mL | INTRAVENOUS | Status: DC | PRN

## 2015-08-19 MED ORDER — MORPHINE SULFATE (PF) 4 MG/ML IV SOLN: 4.0000 mg | Freq: Once | INTRAVENOUS | Status: DC

## 2015-08-19 MED ORDER — DIPHENHYDRAMINE HCL 50 MG/ML IJ SOLN
INTRAMUSCULAR | Status: AC
  Filled 2015-08-19: qty 1

## 2015-08-19 MED ORDER — SUGAMMADEX SODIUM 200 MG/2ML IV SOLN
INTRAVENOUS | Status: DC | PRN
  Administered 2015-08-19: 190 mg via INTRAVENOUS

## 2015-08-19 MED ORDER — FAMOTIDINE 20 MG PO TABS
20.0000 mg | ORAL_TABLET | Freq: Two times a day (BID) | ORAL | Status: DC
  Administered 2015-08-19 – 2015-08-20 (×2): 20 mg via ORAL
  Filled 2015-08-19 (×2): qty 1

## 2015-08-19 MED ORDER — MIDAZOLAM HCL 5 MG/5ML IJ SOLN
INTRAMUSCULAR | Status: DC | PRN
  Administered 2015-08-19: 2 mg via INTRAVENOUS

## 2015-08-19 MED ORDER — SUGAMMADEX SODIUM 200 MG/2ML IV SOLN
INTRAVENOUS | Status: AC
  Filled 2015-08-19: qty 4

## 2015-08-19 MED ORDER — ACETAMINOPHEN 650 MG RE SUPP: 650.0000 mg | RECTAL | Status: DC | PRN

## 2015-08-19 MED ORDER — HYDROMORPHONE HCL 1 MG/ML IJ SOLN
INTRAMUSCULAR | Status: AC
  Administered 2015-08-19: 0.5 mg via INTRAVENOUS
  Filled 2015-08-19: qty 1

## 2015-08-19 MED ORDER — ROCURONIUM BROMIDE 100 MG/10ML IV SOLN
INTRAVENOUS | Status: DC | PRN
  Administered 2015-08-19: 10 mg via INTRAVENOUS
  Administered 2015-08-19: 30 mg via INTRAVENOUS

## 2015-08-19 MED ORDER — MORPHINE SULFATE (PF) 4 MG/ML IV SOLN
INTRAVENOUS | Status: AC
  Administered 2015-08-19: 4 mg
  Filled 2015-08-19: qty 1

## 2015-08-19 MED ORDER — ALUM & MAG HYDROXIDE-SIMETH 200-200-20 MG/5ML PO SUSP: 30.0000 mL | Freq: Four times a day (QID) | ORAL | Status: DC | PRN

## 2015-08-19 MED ORDER — ALPRAZOLAM 0.5 MG PO TABS
0.5000 mg | ORAL_TABLET | Freq: Three times a day (TID) | ORAL | Status: DC | PRN
  Administered 2015-08-19 – 2015-08-20 (×2): 0.5 mg via ORAL
  Filled 2015-08-19 (×2): qty 1

## 2015-08-19 MED ORDER — PHENYLEPHRINE HCL 10 MG/ML IJ SOLN
INTRAVENOUS | Status: DC | PRN
  Administered 2015-08-19: 10 ug/min via INTRAVENOUS

## 2015-08-19 MED ORDER — HEMOSTATIC AGENTS (NO CHARGE) OPTIME
TOPICAL | Status: DC | PRN
  Administered 2015-08-19: 1 via TOPICAL

## 2015-08-19 MED ORDER — FENTANYL CITRATE (PF) 100 MCG/2ML IJ SOLN
INTRAMUSCULAR | Status: AC
  Filled 2015-08-19: qty 4

## 2015-08-19 MED ORDER — LIDOCAINE-EPINEPHRINE 1 %-1:100000 IJ SOLN
INTRAMUSCULAR | Status: DC | PRN
  Administered 2015-08-19: 6 mL

## 2015-08-19 MED ORDER — PHENYLEPHRINE HCL 10 MG/ML IJ SOLN
INTRAMUSCULAR | Status: AC
  Filled 2015-08-19: qty 1

## 2015-08-19 MED ORDER — MEPERIDINE HCL 25 MG/ML IJ SOLN: 6.2500 mg | INTRAMUSCULAR | Status: DC | PRN

## 2015-08-19 MED ORDER — BISACODYL 10 MG RE SUPP: 10.0000 mg | Freq: Every day | RECTAL | Status: DC | PRN

## 2015-08-19 MED ORDER — SUGAMMADEX SODIUM 200 MG/2ML IV SOLN
INTRAVENOUS | Status: AC
  Filled 2015-08-19: qty 2

## 2015-08-19 MED ORDER — ESCITALOPRAM OXALATE 10 MG PO TABS
10.0000 mg | ORAL_TABLET | Freq: Every day | ORAL | Status: DC
  Administered 2015-08-20: 10 mg via ORAL
  Filled 2015-08-19 (×2): qty 1

## 2015-08-19 MED ORDER — METHOCARBAMOL 500 MG PO TABS
ORAL_TABLET | ORAL | Status: AC
  Administered 2015-08-20: 500 mg via ORAL
  Filled 2015-08-19: qty 1

## 2015-08-19 MED ORDER — SUCCINYLCHOLINE CHLORIDE 200 MG/10ML IV SOSY
PREFILLED_SYRINGE | INTRAVENOUS | Status: AC
Start: 2015-08-19 — End: 2015-08-19
  Filled 2015-08-19: qty 10

## 2015-08-19 MED ORDER — THYROID 60 MG PO TABS
60.0000 mg | ORAL_TABLET | Freq: Every day | ORAL | Status: DC
  Administered 2015-08-20: 60 mg via ORAL
  Filled 2015-08-19: qty 1

## 2015-08-19 MED ORDER — EPHEDRINE SULFATE 50 MG/ML IJ SOLN
INTRAMUSCULAR | Status: AC
  Filled 2015-08-19: qty 2

## 2015-08-19 MED ORDER — OXYCODONE-ACETAMINOPHEN 5-325 MG PO TABS
ORAL_TABLET | ORAL | Status: AC
  Administered 2015-08-20: 2 via ORAL
  Filled 2015-08-19: qty 2

## 2015-08-19 MED ORDER — OXYCODONE HCL 5 MG PO TABS
10.0000 mg | ORAL_TABLET | ORAL | Status: DC | PRN
  Administered 2015-08-19 – 2015-08-20 (×2): 10 mg via ORAL
  Filled 2015-08-19 (×2): qty 2

## 2015-08-19 MED ORDER — MIDAZOLAM HCL 2 MG/2ML IJ SOLN
INTRAMUSCULAR | Status: AC
  Filled 2015-08-19: qty 2

## 2015-08-19 MED ORDER — HYDROCODONE-ACETAMINOPHEN 5-325 MG PO TABS: 1.0000 | ORAL_TABLET | ORAL | Status: DC | PRN

## 2015-08-19 MED ORDER — BUPIVACAINE HCL (PF) 0.5 % IJ SOLN
INTRAMUSCULAR | Status: DC | PRN
  Administered 2015-08-19: 6 mL

## 2015-08-19 MED ORDER — ROCURONIUM BROMIDE 10 MG/ML (PF) SYRINGE
PREFILLED_SYRINGE | INTRAVENOUS | Status: AC
  Filled 2015-08-19: qty 40

## 2015-08-19 MED ORDER — SUGAMMADEX SODIUM 500 MG/5ML IV SOLN
INTRAVENOUS | Status: AC
  Filled 2015-08-19: qty 5

## 2015-08-19 MED ORDER — DIPHENHYDRAMINE HCL 50 MG/ML IJ SOLN
INTRAMUSCULAR | Status: DC | PRN
  Administered 2015-08-19: 25 mg via INTRAVENOUS

## 2015-08-19 MED ORDER — VANCOMYCIN HCL IN DEXTROSE 1-5 GM/200ML-% IV SOLN
1000.0000 mg | INTRAVENOUS | Status: AC
  Administered 2015-08-19: 1000 mg via INTRAVENOUS

## 2015-08-19 MED ORDER — FENTANYL CITRATE (PF) 100 MCG/2ML IJ SOLN
INTRAMUSCULAR | Status: DC | PRN
  Administered 2015-08-19 (×2): 50 ug via INTRAVENOUS
  Administered 2015-08-19: 100 ug via INTRAVENOUS

## 2015-08-19 MED ORDER — OXYCODONE-ACETAMINOPHEN 5-325 MG PO TABS
1.0000 | ORAL_TABLET | ORAL | Status: DC | PRN
  Administered 2015-08-19 – 2015-08-20 (×2): 2 via ORAL
  Filled 2015-08-19: qty 2

## 2015-08-19 MED ORDER — HYOSCYAMINE SULFATE 0.125 MG SL SUBL
0.2500 mg | SUBLINGUAL_TABLET | SUBLINGUAL | Status: DC | PRN
  Filled 2015-08-19: qty 2

## 2015-08-19 MED ORDER — 0.9 % SODIUM CHLORIDE (POUR BTL) OPTIME
TOPICAL | Status: DC | PRN
  Administered 2015-08-19: 1000 mL

## 2015-08-19 MED ORDER — PHENOL 1.4 % MT LIQD: 1.0000 | OROMUCOSAL | Status: DC | PRN

## 2015-08-19 MED ORDER — PROPOFOL 10 MG/ML IV BOLUS
INTRAVENOUS | Status: AC
  Filled 2015-08-19: qty 20

## 2015-08-19 MED ORDER — LACTATED RINGERS IV SOLN: INTRAVENOUS | Status: DC

## 2015-08-19 MED ORDER — HYDROMORPHONE HCL 1 MG/ML IJ SOLN
0.2500 mg | INTRAMUSCULAR | Status: DC | PRN
  Administered 2015-08-19 (×4): 0.5 mg via INTRAVENOUS

## 2015-08-19 MED ORDER — MENTHOL 3 MG MT LOZG: 1.0000 | LOZENGE | OROMUCOSAL | Status: DC | PRN

## 2015-08-19 MED ORDER — LIDOCAINE 2% (20 MG/ML) 5 ML SYRINGE
INTRAMUSCULAR | Status: AC
  Filled 2015-08-19: qty 15

## 2015-08-19 MED ORDER — PHENYLEPHRINE 40 MCG/ML (10ML) SYRINGE FOR IV PUSH (FOR BLOOD PRESSURE SUPPORT)
PREFILLED_SYRINGE | INTRAVENOUS | Status: AC
  Filled 2015-08-19: qty 20

## 2015-08-19 MED ORDER — DEXAMETHASONE SODIUM PHOSPHATE 10 MG/ML IJ SOLN
INTRAMUSCULAR | Status: AC
  Filled 2015-08-19: qty 3

## 2015-08-19 MED ORDER — DRONABINOL 2.5 MG PO CAPS
2.5000 mg | ORAL_CAPSULE | Freq: Every day | ORAL | Status: DC | PRN
  Administered 2015-08-20: 2.5 mg via ORAL
  Filled 2015-08-19: qty 1

## 2015-08-19 MED ORDER — VANCOMYCIN HCL IN DEXTROSE 1-5 GM/200ML-% IV SOLN
INTRAVENOUS | Status: AC
  Filled 2015-08-19: qty 200

## 2015-08-19 MED ORDER — ONDANSETRON HCL 4 MG/2ML IJ SOLN
INTRAMUSCULAR | Status: DC | PRN
  Administered 2015-08-19: 4 mg via INTRAVENOUS

## 2015-08-19 MED ORDER — VITAMIN C 500 MG PO TABS
1000.0000 mg | ORAL_TABLET | Freq: Every day | ORAL | Status: DC
  Administered 2015-08-20: 1000 mg via ORAL
  Filled 2015-08-19 (×2): qty 2

## 2015-08-19 MED ORDER — ACETAMINOPHEN 325 MG PO TABS: 650.0000 mg | ORAL_TABLET | ORAL | Status: DC | PRN

## 2015-08-19 MED ORDER — CALCIUM CARBONATE ANTACID 500 MG PO CHEW
3.0000 | CHEWABLE_TABLET | Freq: Two times a day (BID) | ORAL | Status: DC
  Administered 2015-08-19 – 2015-08-20 (×2): 600 mg via ORAL
  Filled 2015-08-19 (×2): qty 3

## 2015-08-19 MED ORDER — PROPOFOL 10 MG/ML IV BOLUS
INTRAVENOUS | Status: DC | PRN
  Administered 2015-08-19: 150 mg via INTRAVENOUS

## 2015-08-19 MED ORDER — DM-GUAIFENESIN ER 30-600 MG PO TB12
1.0000 | ORAL_TABLET | Freq: Two times a day (BID) | ORAL | Status: DC | PRN
  Filled 2015-08-19: qty 1

## 2015-08-19 MED ORDER — PROMETHAZINE HCL 25 MG/ML IJ SOLN: 6.2500 mg | INTRAMUSCULAR | Status: DC | PRN

## 2015-08-19 MED ORDER — METHOCARBAMOL 1000 MG/10ML IJ SOLN
500.0000 mg | Freq: Four times a day (QID) | INTRAVENOUS | Status: DC | PRN
  Filled 2015-08-19: qty 5

## 2015-08-19 MED ORDER — HYDROCORTISONE 2.5 % RE CREA
1.0000 "application " | TOPICAL_CREAM | Freq: Every day | RECTAL | Status: DC | PRN
  Filled 2015-08-19: qty 28.35

## 2015-08-19 MED ORDER — FLEET ENEMA 7-19 GM/118ML RE ENEM: 1.0000 | ENEMA | Freq: Once | RECTAL | Status: DC | PRN

## 2015-08-19 MED ORDER — DOCUSATE SODIUM 100 MG PO CAPS
100.0000 mg | ORAL_CAPSULE | Freq: Two times a day (BID) | ORAL | Status: DC
  Administered 2015-08-19 – 2015-08-20 (×2): 100 mg via ORAL
  Filled 2015-08-19 (×2): qty 1

## 2015-08-19 MED ORDER — VANCOMYCIN HCL IN DEXTROSE 1-5 GM/200ML-% IV SOLN
1000.0000 mg | Freq: Once | INTRAVENOUS | Status: AC
  Administered 2015-08-19: 1000 mg via INTRAVENOUS
  Filled 2015-08-19: qty 200

## 2015-08-19 MED ORDER — BACLOFEN 10 MG PO TABS
10.0000 mg | ORAL_TABLET | Freq: Three times a day (TID) | ORAL | Status: DC
  Administered 2015-08-19 – 2015-08-20 (×2): 10 mg via ORAL
  Filled 2015-08-19 (×2): qty 1

## 2015-08-19 MED ORDER — ONDANSETRON HCL 4 MG/2ML IJ SOLN
4.0000 mg | INTRAMUSCULAR | Status: DC | PRN
Start: 2015-08-19 — End: 2015-08-20

## 2015-08-19 MED ORDER — SUCCINYLCHOLINE CHLORIDE 20 MG/ML IJ SOLN
INTRAMUSCULAR | Status: DC | PRN
  Administered 2015-08-19: 120 mg via INTRAVENOUS

## 2015-08-19 MED ORDER — METHOCARBAMOL 500 MG PO TABS
500.0000 mg | ORAL_TABLET | Freq: Four times a day (QID) | ORAL | Status: DC | PRN
  Administered 2015-08-19 – 2015-08-20 (×2): 500 mg via ORAL
  Filled 2015-08-19: qty 1

## 2015-08-19 MED ORDER — ONDANSETRON HCL 4 MG/2ML IJ SOLN
INTRAMUSCULAR | Status: AC
  Filled 2015-08-19: qty 2

## 2015-08-19 SURGICAL SUPPLY — 56 items
BRUSH SCRUB EZ PLAIN DRY (MISCELLANEOUS) ×2 IMPLANT
BUR PRECISION FLUTE 5.0 (BURR) ×2 IMPLANT
DECANTER SPIKE VIAL GLASS SM (MISCELLANEOUS) IMPLANT
DERMABOND ADVANCED (GAUZE/BANDAGES/DRESSINGS) ×2
DERMABOND ADVANCED .7 DNX12 (GAUZE/BANDAGES/DRESSINGS) ×2 IMPLANT
DRAPE C-ARM 42X72 X-RAY (DRAPES) ×2 IMPLANT
DRAPE LAPAROTOMY 100X72X124 (DRAPES) ×2 IMPLANT
DRAPE POUCH INSTRU U-SHP 10X18 (DRAPES) ×2 IMPLANT
DRAPE SURG 17X23 STRL (DRAPES) ×2 IMPLANT
ELECT REM PT RETURN 9FT ADLT (ELECTROSURGICAL) ×2
ELECTRODE REM PT RTRN 9FT ADLT (ELECTROSURGICAL) ×1 IMPLANT
ELEVATER PASSER (SPINAL CORD STIMULATOR) ×2
GAUZE SPONGE 4X4 16PLY XRAY LF (GAUZE/BANDAGES/DRESSINGS) IMPLANT
GLOVE BIO SURGEON STRL SZ8 (GLOVE) ×2 IMPLANT
GLOVE BIOGEL PI IND STRL 7.0 (GLOVE) ×2 IMPLANT
GLOVE BIOGEL PI IND STRL 7.5 (GLOVE) ×1 IMPLANT
GLOVE BIOGEL PI IND STRL 8 (GLOVE) ×1 IMPLANT
GLOVE BIOGEL PI IND STRL 8.5 (GLOVE) ×1 IMPLANT
GLOVE BIOGEL PI INDICATOR 7.0 (GLOVE) ×2
GLOVE BIOGEL PI INDICATOR 7.5 (GLOVE) ×1
GLOVE BIOGEL PI INDICATOR 8 (GLOVE) ×1
GLOVE BIOGEL PI INDICATOR 8.5 (GLOVE) ×1
GLOVE ECLIPSE 7.5 STRL STRAW (GLOVE) ×2 IMPLANT
GLOVE SURG SS PI 6.5 STRL IVOR (GLOVE) ×6 IMPLANT
GOWN STRL REUS W/ TWL LRG LVL3 (GOWN DISPOSABLE) ×1 IMPLANT
GOWN STRL REUS W/ TWL XL LVL3 (GOWN DISPOSABLE) ×1 IMPLANT
GOWN STRL REUS W/TWL 2XL LVL3 (GOWN DISPOSABLE) ×2 IMPLANT
GOWN STRL REUS W/TWL LRG LVL3 (GOWN DISPOSABLE) ×1
GOWN STRL REUS W/TWL XL LVL3 (GOWN DISPOSABLE) ×1
KIT BASIN OR (CUSTOM PROCEDURE TRAY) ×2 IMPLANT
KIT CHARGING (KITS) ×1
KIT CHARGING PRECISION NEURO (KITS) ×1 IMPLANT
KIT PAT PROGRAM FREELINK (KITS) ×1 IMPLANT
KIT ROOM TURNOVER OR (KITS) ×2 IMPLANT
LEAD ARTISAN 50CM (Spinal Cord Stimulator) ×2 IMPLANT
NEEDLE HYPO 25X1 1.5 SAFETY (NEEDLE) ×2 IMPLANT
NS IRRIG 1000ML POUR BTL (IV SOLUTION) ×2 IMPLANT
PACK LAMINECTOMY NEURO (CUSTOM PROCEDURE TRAY) ×2 IMPLANT
PAD ARMBOARD 7.5X6 YLW CONV (MISCELLANEOUS) ×2 IMPLANT
PASSER ELEVATOR (SPINAL CORD STIMULATOR) ×1 IMPLANT
PRECISION MONT MRIPULSE 303103 (Spinal Cord Stimulator) ×2 IMPLANT
REMOTE CONTROL KIT (KITS) ×2
SPONGE LAP 4X18 X RAY DECT (DISPOSABLE) IMPLANT
SPONGE SURGIFOAM ABS GEL SZ50 (HEMOSTASIS) ×2 IMPLANT
STAPLER SKIN PROX WIDE 3.9 (STAPLE) ×2 IMPLANT
STIMULATOR SPINAL MRI PULSE (Spinal Cord Stimulator) ×1 IMPLANT
SUT SILK 0 TIES 10X30 (SUTURE) IMPLANT
SUT SILK 2 0 FS (SUTURE) ×6 IMPLANT
SUT VIC AB 0 CT1 18XCR BRD8 (SUTURE) ×1 IMPLANT
SUT VIC AB 0 CT1 8-18 (SUTURE) ×1
SUT VIC AB 2-0 CP2 18 (SUTURE) ×2 IMPLANT
SUT VIC AB 3-0 SH 8-18 (SUTURE) ×2 IMPLANT
TOOL LONG TUNNEL (SPINAL CORD STIMULATOR) ×2 IMPLANT
TOWEL OR 17X24 6PK STRL BLUE (TOWEL DISPOSABLE) ×2 IMPLANT
TOWEL OR 17X26 10 PK STRL BLUE (TOWEL DISPOSABLE) ×2 IMPLANT
WATER STERILE IRR 1000ML POUR (IV SOLUTION) ×2 IMPLANT

## 2015-08-19 NOTE — Brief Op Note (Signed)
08/19/2015  5:47 PM  PATIENT:  Karen Aguilar  50 y.o. female  PRE-OPERATIVE DIAGNOSIS:  Radiculopathy, Lumbar region, chronic pain  POST-OPERATIVE DIAGNOSIS:  Radiculopathy, Lumbar region, chronic pain  PROCEDURE:  Procedure(s) with comments: LUMBAR SPINAL CORD STIMULATOR INSERTION (N/A) - LUMBAR SPINAL CORD STIMULATOR INSERTION via T 9 10 laminectomy with Implantable pulse generator placement  SURGEON:  Surgeon(s) and Role:    * Erline Levine, MD - Primary  PHYSICIAN ASSISTANT:   ASSISTANTS: Poteat, RN   ANESTHESIA:   general  EBL:  Total I/O In: 800 [I.V.:800] Out: -   BLOOD ADMINISTERED:none  DRAINS: none   LOCAL MEDICATIONS USED:  LIDOCAINE   SPECIMEN:  No Specimen  DISPOSITION OF SPECIMEN:  N/A  COUNTS:  YES  TOURNIQUET:  * No tourniquets in log *  DICTATION: DICTATION: Patient is 50 year old man with previous  fusion surgery  who has chronic pain. She presents for laminectomy and placement of spinal cord stimulator.  Procedure: Following smooth and uncomplicated induction of general endotracheal anesthesia the patient was placed in a prone position on chest rolls. C-arm was used to mark the T9 level. Her left flank was also marked for placement of implantable pulse generator. Her back was then prepped and draped in the usual sterile fashion with Betadine scrub and Duraprep. Area of planned incision was infiltrated with local lidocaine. An incision was made carried to the thoracic fascia which was incised on the right side of midline and a subperiosteal dissection was performed exposing the T9 10 interlaminar space. After confirming correct orientation with C-arm a thoracic laminectomy was performed a high-speed drill and Kerrison rongeurs exposing the spinal cord dura. A 4 x8 [employer] paddle electrode was then inserted in the epidural space and was found to be well positioned within the midline up to the middle aspect of T7. Anchors were then placed in the  fascia was closed. A subcutaneous pocket was created to the left of midline. The tunneler was used to create a subcutaneous tract for the electrodes and these were then passed, anchored to the implantable pulse generator and connections were torqued appropriately. Redundant electrode was circularized beneath the MRI compatible IPG and a strain release loop was placed in the laminectomy incision site. The wounds were irrigated and closed with 20 and 3-0 Vicryl stitches and the wounds were dressed with Dermabond. Impedances were assessed and found to be correct. Final radiograph demonstrated that the lead was in the midline from the lower half of T7 to the superior half of T 9.  Patient was extubated and was taken to recovery having tolerated procedure well counts were correct at the end of the case.  PLAN OF CARE: Admit to inpatient   PATIENT DISPOSITION:  PACU - hemodynamically stable.   Delay start of Pharmacological VTE agent (>24hrs) due to surgical blood loss or risk of bleeding: yes

## 2015-08-19 NOTE — H&P (Signed)
Patient ID:   [id number] Patient: Karen Aguilar  Date of Birth: Apr 27, 1965 Visit Type: Office Visit   Date: 07/27/2015 02:15 PM Provider: Marchia Meiers. Vertell Limber MD   This 50 year old female presents for back pain.  History of Present Illness: 1.  back pain    Karen Aguilar, a current patient of Dr. Windy Carina, visits for discussion of spinal cord stimulator paddle lead and IPG placement.  She reports completing a 2-1/2 day stim trial with Dr. Hardin Negus in May.  Her trial had to be cut short due to a severe tape/skin reaction.  She also notes that her allergy testing has been negative, reassuring her that the Karen Aguilar device will not cause additional problems. During the trial, she noted significant shock when she changed positions, especially when sitting or standing. She is very weak and numb in her right leg. She is also weak in her left leg, but it is not as severe.   Physical Exam: Loss of muscle mass in her gastronemius and soleus on the right, not able to stand on toes on the right, weak in plantar flexion and dorsiflexion on the right.   Thoracic X-rays reveal rudimentary T12 ribs, top of trial lead was into T7 with majority of lead at T8.      Medical/Surgical/Interim History Reviewed, no change.   PAST MEDICAL HISTORY, SURGICAL HISTORY, FAMILY HISTORY, SOCIAL HISTORY AND REVIEW OF SYSTEMS  09/24/2013, which I have signed.  Family History: Reviewed, no changes.    Social History: Tobacco use reviewed. Reviewed, no changes.     MEDICATIONS(added, continued or stopped this visit): Started Medication Directions Instruction Stopped   Amitiza 24 mcg capsule take 1 capsule by oral route 2 times every day with food and water     Armour Thyroid 60 mg tablet Take as directed     Armour Thyroid 90 mg tablet   07/27/2015   baclofen 10 mg tablet      buspirone 10 mg tablet      dronabinol 2.5 mg capsule Take as directed     escitalopram 20 mg tablet      estropipate 0.75 mg  tablet take 1 tablet by oral route  every day cyclically, 3 weeks on and 1 week off     lidocaine-hydrocortisone 3 %-0.5 % topical cream     05/19/2014 Medrol (Pak) 4 mg tablets in a dose pack Use as directed  07/27/2015   Nucynta 100 mg tablet take 1 tablet by oral route  every 4 hours as needed     Nucynta 100 mg tablet take 1 tablet by oral route  every 4 hours as needed  07/27/2015   progesterone micronized 100 mg capsule take 1 capsule by oral route  every day     ropinirole 0.5 mg tablet      Synthroid 75 mcg tablet   07/27/2015   topiramate 100 mg tablet      Xanax 0.5 mg tablet take 1 tablet by oral route 3 times every day       ALLERGIES: Ingredient Reaction Medication Name Comment  THIMEROSAL     SULFA (SULFONAMIDE ANTIBIOTICS)     ERYTHROMYCIN BASE         Vitals Date Temp F BP Pulse Ht In Wt Lb BMI BSA Pain Score  07/27/2015  121/84 67 68 209 31.78  7/10     DIAGNOSTIC RESULTS Thoracic X-rays reveal rudimentary T12 ribs, top of trial lead was into T7 with majority of lead at T8.  IMPRESSION The patient continues to have increasing low back and right leg pain as well as difficulties with bladder/bowel control. She would like to go ahead with the spinal cord stimulator and IPG placement. She had a severe reaction to the adhesive of the trial lead, but allergic testing came back negative so the Mamou device will not be a problem. On review of her X-rays, she has rudimentary T12 ribs and the top of her trial lead was at T7 while the majority was at T8. On physical exam, she is very weak in her right leg with dorsiflexion and plantar flexion, has loss of muscle mass in her right gastrocnemius and soleus, and is not able to stand on her toes on the right leg. We will schedule the surgery for 8/18 for alleviation of her low back, right leg, and bladder/bowel issues.   Completed Orders (this encounter) Order Details Reason Side Interpretation Result Initial  Treatment Date Region  Dietary management education, guidance, and counseling Encouraged to eat a well balanced diet and follow up with primary care physician.         Assessment/Plan # Detail Type Description   1. Assessment Body mass index (BMI) 31.0-31.9, adult (Z68.31).   Plan Orders Today's instructions / counseling include(s) Dietary management education, guidance, and counseling.         Pain Assessment/Treatment Pain Scale: 7/10. Method: Numeric Pain Intensity Scale. Location: back. Onset: 06/21/2013. Duration: varies. Quality: discomforting. Pain Assessment/Treatment follow-up plan of care: Patient taking medications as prescribed..  Fall Risk Plan The patient has not fallen in the last year.  Schedule spinal cord stimulator and IPG placement for 8/18.  Orders: Instruction(s)/Education: Assessment Instruction  Z68.31 Dietary management education, guidance, and counseling             Provider:  Marchia Meiers. Vertell Limber MD  07/27/2015 03:26 PM Dictation edited by: Johnella Moloney    CC Providers: Clayborn Bigness [address] [place],  [place]  [id number]-   Gary Cram Jr MD [address] [place], [place] [address]              Electronically signed by Marchia Meiers. Vertell Limber MD on 07/27/2015 05:18 PM

## 2015-08-19 NOTE — Op Note (Signed)
08/19/2015  5:47 PM  PATIENT:  Karen Aguilar  50 y.o. female  PRE-OPERATIVE DIAGNOSIS:  Radiculopathy, Lumbar region, chronic pain  POST-OPERATIVE DIAGNOSIS:  Radiculopathy, Lumbar region, chronic pain  PROCEDURE:  Procedure(s) with comments: LUMBAR SPINAL CORD STIMULATOR INSERTION (N/A) - LUMBAR SPINAL CORD STIMULATOR INSERTION via T 9 10 laminectomy with Implantable pulse generator placement  SURGEON:  Surgeon(s) and Role:    * Erline Levine, MD - Primary  PHYSICIAN ASSISTANT:   ASSISTANTS: Poteat, RN   ANESTHESIA:   general  EBL:  Total I/O In: 800 [I.V.:800] Out: -   BLOOD ADMINISTERED:none  DRAINS: none   LOCAL MEDICATIONS USED:  LIDOCAINE   SPECIMEN:  No Specimen  DISPOSITION OF SPECIMEN:  N/A  COUNTS:  YES  TOURNIQUET:  * No tourniquets in log *  DICTATION: DICTATION: Patient is 50 year old man with previous  fusion surgery  who has chronic pain. She presents for laminectomy and placement of spinal cord stimulator.  Procedure: Following smooth and uncomplicated induction of general endotracheal anesthesia the patient was placed in a prone position on chest rolls. C-arm was used to mark the T9 level. Her left flank was also marked for placement of implantable pulse generator. Her back was then prepped and draped in the usual sterile fashion with Betadine scrub and Duraprep. Area of planned incision was infiltrated with local lidocaine. An incision was made carried to the thoracic fascia which was incised on the right side of midline and a subperiosteal dissection was performed exposing the T9 10 interlaminar space. After confirming correct orientation with C-arm a thoracic laminectomy was performed a high-speed drill and Kerrison rongeurs exposing the spinal cord dura. A 4 x8 [employer] paddle electrode was then inserted in the epidural space and was found to be well positioned within the midline up to the middle aspect of T7. Anchors were then placed in the  fascia was closed. A subcutaneous pocket was created to the left of midline. The tunneler was used to create a subcutaneous tract for the electrodes and these were then passed, anchored to the implantable pulse generator and connections were torqued appropriately. Redundant electrode was circularized beneath the MRI compatible IPG and a strain release loop was placed in the laminectomy incision site. The wounds were irrigated and closed with 20 and 3-0 Vicryl stitches and the wounds were dressed with Dermabond. Impedances were assessed and found to be correct. Final radiograph demonstrated that the lead was in the midline from the lower half of T7 to the superior half of T 9.  Patient was extubated and was taken to recovery having tolerated procedure well counts were correct at the end of the case.  PLAN OF CARE: Admit to inpatient   PATIENT DISPOSITION:  PACU - hemodynamically stable.   Delay start of Pharmacological VTE agent (>24hrs) due to surgical blood loss or risk of bleeding: yes

## 2015-08-19 NOTE — Anesthesia Preprocedure Evaluation (Addendum)
Anesthesia Evaluation  Patient identified by MRN, date of birth, ID band Patient awake    Reviewed: Allergy & Precautions, NPO status , Patient's Chart, lab work & pertinent test results  History of Anesthesia Complications (+) PONV and history of anesthetic complications  Airway Mallampati: III  TM Distance: >3 FB Neck ROM: Full    Dental  (+) Teeth Intact, Dental Advisory Given   Pulmonary sleep apnea ,    breath sounds clear to auscultation       Cardiovascular negative cardio ROS   Rhythm:Regular Rate:Normal     Neuro/Psych  Headaches, PSYCHIATRIC DISORDERS Anxiety Depression  Neuromuscular disease    GI/Hepatic Neg liver ROS, GERD  ,  Endo/Other  Hypothyroidism   Renal/GU negative Renal ROS  negative genitourinary   Musculoskeletal  (+) Arthritis , Osteoarthritis,  Fibromyalgia -  Abdominal   Peds negative pediatric ROS (+)  Hematology   Anesthesia Other Findings   Reproductive/Obstetrics negative OB ROS                            Lab Results  Component Value Date   WBC 5.2 08/16/2015   HGB 13.5 08/16/2015   HCT 41.7 08/16/2015   MCV 88.0 08/16/2015   PLT 242 08/16/2015   Lab Results  Component Value Date   CREATININE 0.97 08/16/2015   BUN 12 08/16/2015   NA 139 08/16/2015   K 3.6 08/16/2015   CL 107 08/16/2015   CO2 26 08/16/2015   Lab Results  Component Value Date   INR 0.9 12/10/2006    Anesthesia Physical Anesthesia Plan  ASA: III  Anesthesia Plan: General   Post-op Pain Management:    Induction: Intravenous, Rapid sequence and Cricoid pressure planned  Airway Management Planned: Oral ETT  Additional Equipment:   Intra-op Plan:   Post-operative Plan: Extubation in OR  Informed Consent: I have reviewed the patients History and Physical, chart, labs and discussed the procedure including the risks, benefits and alternatives for the proposed anesthesia  with the patient or authorized representative who has indicated his/her understanding and acceptance.   Dental advisory given  Plan Discussed with: CRNA  Anesthesia Plan Comments:         Anesthesia Quick Evaluation

## 2015-08-19 NOTE — Anesthesia Postprocedure Evaluation (Signed)
Anesthesia Post Note  Patient: Karen Aguilar  Procedure(s) Performed: Procedure(s) (LRB): LUMBAR SPINAL CORD STIMULATOR INSERTION (N/A)  Patient location during evaluation: PACU Anesthesia Type: General Level of consciousness: awake and alert Pain management: pain level controlled Vital Signs Assessment: post-procedure vital signs reviewed and stable Respiratory status: spontaneous breathing, nonlabored ventilation, respiratory function stable and patient connected to nasal cannula oxygen Cardiovascular status: blood pressure returned to baseline and stable Postop Assessment: no signs of nausea or vomiting Anesthetic complications: no    Last Vitals:  Vitals:   08/19/15 1835 08/19/15 1840  BP:  (!) 124/94  Pulse: 74 75  Resp: 15 14  Temp:  (!) 36 C    Last Pain:  Vitals:   08/19/15 1825  TempSrc:   PainSc: 5                  Tiajuana Amass

## 2015-08-19 NOTE — Anesthesia Procedure Notes (Signed)
Procedure Name: Intubation Date/Time: 08/19/2015 4:00 PM Performed by: Lavell Luster Pre-anesthesia Checklist: Patient identified, Emergency Drugs available, Suction available, Patient being monitored and Timeout performed Patient Re-evaluated:Patient Re-evaluated prior to inductionOxygen Delivery Method: Circle system utilized Preoxygenation: Pre-oxygenation with 100% oxygen Intubation Type: IV induction and Rapid sequence Laryngoscope Size: Mac and 3 Grade View: Grade I Tube type: Oral Tube size: 7.5 mm Number of attempts: 1 Airway Equipment and Method: Stylet Placement Confirmation: ETT inserted through vocal cords under direct vision,  positive ETCO2 and breath sounds checked- equal and bilateral Secured at: 22 cm Tube secured with: Tape Dental Injury: Teeth and Oropharynx as per pre-operative assessment

## 2015-08-19 NOTE — Interval H&P Note (Signed)
History and Physical Interval Note:  08/19/2015 7:45 AM  Karen Aguilar  has presented today for surgery, with the diagnosis of Radiculopathy, Lumbar region  The various methods of treatment have been discussed with the patient and family. After consideration of risks, benefits and other options for treatment, the patient has consented to  Procedure(s) with comments: [place] (N/A) - [place] as a surgical intervention .  The patient's history has been reviewed, patient examined, no change in status, stable for surgery.  I have reviewed the patient's chart and labs.  Questions were answered to the patient's satisfaction.     Elmar Antigua D

## 2015-08-19 NOTE — Progress Notes (Signed)
Awake, alert, conversant.  MAEW with good strength.  Doing well. 

## 2015-08-19 NOTE — Discharge Instructions (Signed)

## 2015-08-19 NOTE — Transfer of Care (Signed)
Immediate Anesthesia Transfer of Care Note  Patient: Karen Aguilar  Procedure(s) Performed: Procedure(s) with comments: LUMBAR SPINAL CORD STIMULATOR INSERTION (N/A) - [place]  Patient Location: PACU  Anesthesia Type:General  Level of Consciousness: awake, alert  and oriented  Airway & Oxygen Therapy: Patient Spontanous Breathing and Patient connected to nasal cannula oxygen  Post-op Assessment: Report given to RN, Post -op Vital signs reviewed and stable and Patient moving all extremities  Post vital signs: Reviewed and stable  Last Vitals:  Vitals:   08/19/15 1329 08/19/15 1753  BP: 112/72   Pulse: 76 (P) 81  Resp: 20   Temp: 36.9 C     Last Pain:  Vitals:   08/19/15 1332  TempSrc:   PainSc: 5          Complications: No apparent anesthesia complications

## 2015-08-20 NOTE — Evaluation (Signed)
Physical Therapy Evaluation Patient Details Name: Karen Aguilar MRN: [id number] DOB: 12-10-1965 Today's Date: 08/20/2015   History of Present Illness  50yo female with installation of spinal stimulator with laminectomy at T9-10, pain is a 6/10.  Pt is walking on SPC but switched to walker.  Has PMHx:  Diverticulitis, IBS, bipolar disorder, GERD, anemia, DDD cervical and lumbar spine, depression, fusions both cervical and lumbar spines, fibromyalgia, migraines, incontinence of bladder, neurogenic bladder, vit D deficiency, pulm nodule, hardware removal lumbar spine  Clinical Impression  Pt is able to name all precautions after review initially and was more stable with RW.  Took her on steps to increase her safety awareness, and pt declined to try one rail technique.  Did demonstrate for her to allow her to see that spine is not twisted to do this correctly.  Will not follow up in this setting as pt is discharging home today and OT to see her for any final education.    Follow Up Recommendations Home health PT;Supervision for mobility/OOB    Equipment Recommendations  None recommended by PT    Recommendations for Other Services Rehab consult     Precautions / Restrictions Precautions Precautions: Back;Other (comment) (spinal stimulator) Precaution Booklet Issued: Yes (comment) Precaution Comments: pt needed reminding of one precaution but remembered on review Restrictions Weight Bearing Restrictions: No Other Position/Activity Restrictions: R leg is weaker than LLE      Mobility  Bed Mobility Overal bed mobility: Modified Independent             General bed mobility comments: reminded her of back precautions and bed mobility  Transfers Overall transfer level: Modified independent Equipment used: Rolling walker (2 wheeled) (elevated bed)             General transfer comment: prompted hand placement  Ambulation/Gait Ambulation/Gait assistance: Supervision Ambulation  Distance (Feet): 300 Feet Assistive device: Rolling walker (2 wheeled);1 person hand held assist Gait Pattern/deviations: Step-through pattern;Wide base of support;Decreased weight shift to right;Decreased dorsiflexion - right;Decreased stride length Gait velocity: erduced Gait velocity interpretation: Below normal speed for age/gender    Stairs Stairs: Yes Stairs assistance: Supervision Stair Management: One rail Right;One rail Left;Alternating pattern;Step to pattern;Sideways Number of Stairs: 3 General stair comments: reviewed techniques  Wheelchair Mobility    Modified Rankin (Stroke Patients Only)       Balance Overall balance assessment: Needs assistance Sitting-balance support: Feet supported Sitting balance-Leahy Scale: Good     Standing balance support: Bilateral upper extremity supported Standing balance-Leahy Scale: Fair                               Pertinent Vitals/Pain Pain Assessment: 0-10 Pain Score: 6  Pain Location: back and R leg numb Pain Descriptors / Indicators: Aching;Operative site guarding Pain Intervention(s): Limited activity within patient's tolerance;Monitored during session;Premedicated before session;Repositioned    Home Living Family/patient expects to be discharged to:: Private residence Living Arrangements: Spouse/significant other;Children Available Help at Discharge: Family;Available 24 hours/day Type of Home: House Home Access: Stairs to enter Entrance Stairs-Rails: None Entrance Stairs-Number of Steps: 3 Home Layout: One level Home Equipment: Walker - 2 wheels;Cane - single point;Shower seat;Bedside commode Additional Comments: has full list of equipment due to previous surgeries    Prior Function Level of Independence: Needs assistance   Gait / Transfers Assistance Needed: mod I with assistive device  ADL's / Homemaking Assistance Needed: has family assisting with any tasks with lifting  Hand  Dominance        Extremity/Trunk Assessment   Upper Extremity Assessment: Overall WFL for tasks assessed           Lower Extremity Assessment: RLE deficits/detail RLE Deficits / Details: numbness and calf weakness to toe off    Cervical / Trunk Assessment: Other exceptions (has been given T9-10 laminectomy for spinal stim, prev fusio)  Communication      Cognition Arousal/Alertness: Awake/alert Behavior During Therapy: WFL for tasks assessed/performed Overall Cognitive Status: Within Functional Limits for tasks assessed                      General Comments General comments (skin integrity, edema, etc.): Pt is up to walk on [facility] when PT arrived and added RW to increase stability and manage her R leg numbness.    Exercises        Assessment/Plan    PT Assessment Patient needs continued PT services  PT Diagnosis Difficulty walking;Acute pain   PT Problem List Decreased strength;Decreased range of motion;Decreased activity tolerance;Decreased balance;Decreased mobility;Decreased coordination;Decreased knowledge of use of DME;Decreased safety awareness;Decreased knowledge of precautions;Obesity;Decreased skin integrity;Pain  PT Treatment Interventions DME instruction;Gait training;Stair training;Functional mobility training;Therapeutic activities;Therapeutic exercise;Balance training;Neuromuscular re-education;Patient/family education   PT Goals (Current goals can be found in the Care Plan section) Acute Rehab PT Goals Patient Stated Goal: to get home PT Goal Formulation: All assessment and education complete, DC therapy (will follow up with HHPT) Time For Goal Achievement: 09/03/15 Potential to Achieve Goals: Good    Frequency     Barriers to discharge Inaccessible home environment      Co-evaluation               End of Session Equipment Utilized During Treatment: Gait belt Activity Tolerance: Patient tolerated treatment well;Patient limited by  pain Patient left: in bed;with call bell/phone within reach Nurse Communication: Mobility status;Other (comment) (notified OT to come for eval)    Functional Assessment Tool Used: clinical judgment Functional Limitation: Mobility: Walking and moving around Mobility: Walking and Moving Around Current Status [phone]): At least 20 percent but less than 40 percent impaired, limited or restricted Mobility: Walking and Moving Around Goal Status [phone]): At least 1 percent but less than 20 percent impaired, limited or restricted    Time: 0956-1025 PT Time Calculation (min) (ACUTE ONLY): 29 min   Charges:   PT Evaluation $PT Eval Moderate Complexity: 1 Procedure PT Treatments $Gait Training: 8-22 mins   PT G Codes:   PT G-Codes **NOT FOR INPATIENT CLASS** Functional Assessment Tool Used: clinical judgment Functional Limitation: Mobility: Walking and moving around Mobility: Walking and Moving Around Current Status [phone]): At least 20 percent but less than 40 percent impaired, limited or restricted Mobility: Walking and Moving Around Goal Status [phone]): At least 1 percent but less than 20 percent impaired, limited or restricted    Ramond Dial 08/20/2015, 10:51 AM    Mee Hives, PT MS Acute Rehab Dept. Number: [place] [facility] [place]

## 2015-08-20 NOTE — Discharge Summary (Signed)
Physician Discharge Summary  Patient ID: Karen Aguilar MRN: [id number] DOB/AGE: [birth year] 50 y.o.  Admit date: 08/19/2015 Discharge date: 08/20/2015  Admission Diagnoses:chronic lumbar p[ain  Discharge Diagnoses:  Active Problems:   Chronic pain   Discharged Condition: stable  Hospital Course:surgery for scs  Consults: none  Significant Diagnostic Studies: mri  Treatments: insertion of scs  Discharge Exam: Blood pressure 100/73, pulse 82, temperature 98.4 F (36.9 C), resp. rate 18, weight 94.3 kg (208 lb), last menstrual period 10/04/2014, SpO2 100 %. Ambulating , no pain  Disposition: 01-Home or Self Care     Medication List    TAKE these medications   ALPRAZolam 0.5 MG tablet Commonly known as:  XANAX TAKE 1 TABLET (0.5 mg) BY MOUTH every eight (8) hours for anxiety due to pain   baclofen 10 MG tablet Commonly known as:  LIORESAL Take 10 mg by mouth 3 (three) times daily.   camphor-menthol lotion Commonly known as:  SARNA Apply 1 application topically at bedtime as needed for itching.   cetirizine 10 MG tablet Commonly known as:  ZYRTEC Take 10 mg by mouth daily as needed for allergies.   clobetasol ointment 0.05 % Commonly known as:  TEMOVATE Apply 1 application topically as needed (for eczema).   dexlansoprazole 60 MG capsule Commonly known as:  DEXILANT Take 1 capsule (60 mg total) by mouth 2 (two) times daily.   diclofenac sodium 1 % Gel Commonly known as:  VOLTAREN Apply 1 application topically 3 (three) times daily as needed (for bursitis).   dronabinol 2.5 MG capsule Commonly known as:  MARINOL Take 2.5 mg by mouth daily as needed (for nausea and pain).   escitalopram 10 MG tablet Commonly known as:  LEXAPRO TAKE 1 TABLET BY MOUTH EVERY DAY What changed:  See the new instructions.   hydrocortisone 2.5 % rectal cream Commonly known as:  ANUSOL-HC Apply 1 application topically daily as needed for hemorrhoids or itching.   hyoscyamine  0.125 MG SL tablet Commonly known as:  LEVSIN SL DISSOLVE 1 TABLET(0.125 MG) UNDER THE TONGUE EVERY 4 HOURS AS NEEDED What changed:  See the new instructions.   metroNIDAZOLE 1 % gel Commonly known as:  METROGEL Apply 1 application topically 2 (two) times daily as needed (for rosacea acne).   MUCINEX SINUS-MAX 5-10-200-325 MG Caps Generic drug:  Phenylephrine-DM-GG-APAP Take 2 tablets by mouth 2 (two) times daily as needed (for migraine).   NUCYNTA 100 MG Tabs Generic drug:  Tapentadol HCl Take 100 mg by mouth every 8 (eight) hours.   thyroid 60 MG tablet Commonly known as:  ARMOUR Take 60 mg by mouth daily before breakfast.   topiramate 100 MG tablet Commonly known as:  TOPAMAX TAKE 1 TABLET BY MOUTH EVERY DAY What changed:  See the new instructions.   TUMS SMOOTHIES 750 MG chewable tablet Generic drug:  calcium carbonate Chew 2-3 tablets by mouth 2 (two) times daily.   VITAMIN C PO Take 1 tablet by mouth as needed (for immune system support).      Follow-up Information    Peggyann Shoals, MD .   Specialty:  Neurosurgery Contact information: [address] [place] [address] [phone]           Signed: Floyce Stakes 08/20/2015, 8:32 AM

## 2015-08-20 NOTE — Evaluation (Signed)
Occupational Therapy Evaluation and Discharge Patient Details Name: Karen Aguilar MRN: [id number] DOB: Jan 08, 1965 Today's Date: 08/20/2015    History of Present Illness 50yo female with installation of spinal stimulator with laminectomy at T9-10, pain is a 6/10.  Pt is walking on SPC but switched to walker.  Has PMHx:  Diverticulitis, IBS, bipolar disorder, GERD, anemia, DDD cervical and lumbar spine, depression, fusions both cervical and lumbar spines, fibromyalgia, migraines, incontinence of bladder, neurogenic bladder, vit D deficiency, pulm nodule, hardware removal lumbar spine   Clinical Impression   This 50 yo female admitted and underwent above presents to acute OT with all OT education complete with pt and family, we will D/C from acute OT.    Follow Up Recommendations  No OT follow up;Supervision/Assistance - 24 hour    Equipment Recommendations  None recommended by OT       Precautions / Restrictions Precautions Precautions: Back;Other (comment) (spinal stimulater) Precaution Booklet Issued: Yes (comment) Restrictions Weight Bearing Restrictions: No              ADL Overall ADL's : Needs assistance/impaired                                       General ADL Comments: pt is able to do her UBB/D and family A with LBB/D prn. We discussed how wet wipes would be better for back peri-care than toilet paper for ease of cleaning and to avoid as much twisting.  She had already figutred out that for brushing her teeth she had to use 2 cups one to spit into to avoid bending and one to rinse with               Pertinent Vitals/Pain Pain Assessment: 0-10 Pain Score: 5  Pain Location: back and right leg Pain Descriptors / Indicators: Aching;Sore;Guarding Pain Intervention(s): Monitored during session        Extremity/Trunk Assessment Upper Extremity Assessment Upper Extremity Assessment: Overall WFL for tasks assessed              Cognition  Arousal/Alertness: Awake/alert Behavior During Therapy: WFL for tasks assessed/performed Overall Cognitive Status: Within Functional Limits for tasks assessed                                Home Living Family/patient expects to be discharged to:: Private residence Living Arrangements: Spouse/significant other;Children Available Help at Discharge: Family;Available 24 hours/day Type of Home: House Home Access: Stairs to enter [employer] of Steps: 3 Entrance Stairs-Rails: None Home Layout: One level     Bathroom Shower/Tub:  (walk in tub)   Bathroom Toilet: Handicapped height     Home Equipment: Environmental consultant - 2 wheels;Cane - single point;Shower seat;Bedside commode   Additional Comments: has full list of equipment due to previous surgeries      Prior Functioning/Environment Level of Independence: Needs assistance  Gait / Transfers Assistance Needed: mod I with assistive device ADL's / Homemaking Assistance Needed: has family assisting with any tasks with lifting and some LBB        OT Diagnosis: Generalized weakness;Acute pain         OT Goals(Current goals can be found in the care plan section) Acute Rehab OT Goals Patient Stated Goal: home today  OT Frequency:  End of Session Nurse Communication:  (pt ready to go from therapy standpoint and waitng on D/C instructions)  Activity Tolerance: Patient tolerated treatment well Patient left:  (sitting EOB)   Time: [id number] OT Time Calculation (min): 13 min Charges:  OT General Charges $OT Visit: 1 Procedure OT Evaluation $OT Eval Moderate Complexity: 1 Procedure G-Codes: OT G-codes **NOT FOR INPATIENT CLASS** Functional Assessment Tool Used: Clincal observation Functional Limitation: Self care Self Care Current Status [id number]): At least 40 percent but less than 60 percent impaired, limited or restricted Self Care Goal Status [id number]): At least 40 percent but less than 60 percent  impaired, limited or restricted Self Care Discharge Status [phone]): At least 40 percent but less than 60 percent impaired, limited or restricted  Almon Register [id number] 08/20/2015, 4:02 PM

## 2015-08-20 NOTE — Progress Notes (Signed)
Patient alert and oriented, mae's well, voiding adequate amount of urine, swallowing without difficulty, c/o moderate pain and meds given prior to discharged for ride and discomfort. Patient discharged home with family. Script and discharged instructions given to patient. Patient and family stated understanding of instructions given.   

## 2015-08-22 ENCOUNTER — Encounter: Payer: Self-pay | Admitting: Family Medicine

## 2015-08-23 ENCOUNTER — Encounter: Payer: Self-pay | Admitting: Family Medicine

## 2015-08-24 ENCOUNTER — Encounter (HOSPITAL_COMMUNITY): Payer: Self-pay | Admitting: Neurosurgery

## 2015-09-07 ENCOUNTER — Ambulatory Visit (INDEPENDENT_AMBULATORY_CARE_PROVIDER_SITE_OTHER): Payer: Medicare Other | Admitting: Family Medicine

## 2015-09-07 ENCOUNTER — Encounter: Payer: Self-pay | Admitting: Family Medicine

## 2015-09-07 ENCOUNTER — Ambulatory Visit: Payer: Medicare Other | Admitting: Family Medicine

## 2015-09-07 DIAGNOSIS — L509 Urticaria, unspecified: Secondary | ICD-10-CM

## 2015-09-07 MED ORDER — PREDNISONE 20 MG PO TABS: ORAL_TABLET | ORAL | 0 refills | Status: DC

## 2015-09-07 NOTE — Progress Notes (Signed)
Hives.  Started a few days ago, 09/03/15.  Bilateral, legs trunk and arms.  No new meds.    Had surgery 2 weeks ago, spinal cord stimulator, not fully programmed yet.  She clearly had a period of time from the surgery to the onset of sx w/o sx, ie sx didn't start at time of surgery.   Lesion are itchy.  No lip or tongue swelling.  No sx on palms of hands.  No wheezing.    No new foods, detergents, or soaps.  No sx like this prev. Already on antihistamine.    Meds, vitals, and allergies reviewed.   ROS: Per HPI unless specifically indicated in ROS section   GEN: nad, alert and oriented HEENT: mucous membranes moist, no lip or tongue swelling NECK: supple w/o LA CV: rrr.  PULM: ctab, no inc wob, no wheeze, no stridor EXT: no edema SKIN: back incisions well healed.  Blanching irregular wheals noted on the back and legs B, small lesions on the arms.

## 2015-09-07 NOTE — Patient Instructions (Signed)
Prednisone with food in the meantime.  If any lip or tongue swelling, then dial 911.  Update [place] as needed.  We can refer to allergy clinic if needed.   Take care.  Glad to see you.

## 2015-09-07 NOTE — Progress Notes (Signed)
Pre visit review using our clinic review tool, if applicable. No additional management support is needed unless otherwise documented below in the visit note. 

## 2015-09-08 DIAGNOSIS — L509 Urticaria, unspecified: Secondary | ICD-10-CM | POA: Insufficient documentation

## 2015-09-08 NOTE — Assessment & Plan Note (Signed)
Unclear source. No airway symptoms. Still okay for outpatient follow-up. Already on antihistamine. I would think that her symptoms are NOT related to the implanted device. She has no local reaction at the implant site. Start prednisone with routine cautions. Discussed with patient about possible referral to allergy clinic. At this point we probably should not refer her, since she is going to be on prednisone anyway. He she has return of symptoms we can refer her then. She agrees. Routine ER cautions given, should she have any airway symptoms. She agrees. Routed to PCP as FYI.

## 2015-09-09 DIAGNOSIS — J309 Allergic rhinitis, unspecified: Secondary | ICD-10-CM | POA: Diagnosis not present

## 2015-09-09 DIAGNOSIS — M961 Postlaminectomy syndrome, not elsewhere classified: Secondary | ICD-10-CM | POA: Diagnosis not present

## 2015-09-09 DIAGNOSIS — H1045 Other chronic allergic conjunctivitis: Secondary | ICD-10-CM | POA: Diagnosis not present

## 2015-09-09 DIAGNOSIS — M62831 Muscle spasm of calf: Secondary | ICD-10-CM | POA: Diagnosis not present

## 2015-09-09 DIAGNOSIS — Z79891 Long term (current) use of opiate analgesic: Secondary | ICD-10-CM | POA: Diagnosis not present

## 2015-09-09 DIAGNOSIS — G894 Chronic pain syndrome: Secondary | ICD-10-CM | POA: Diagnosis not present

## 2015-09-09 DIAGNOSIS — R21 Rash and other nonspecific skin eruption: Secondary | ICD-10-CM | POA: Diagnosis not present

## 2015-09-09 DIAGNOSIS — K210 Gastro-esophageal reflux disease with esophagitis: Secondary | ICD-10-CM | POA: Diagnosis not present

## 2015-09-15 DIAGNOSIS — L508 Other urticaria: Secondary | ICD-10-CM | POA: Diagnosis not present

## 2015-09-19 ENCOUNTER — Ambulatory Visit (HOSPITAL_COMMUNITY): Payer: Medicare Other

## 2015-09-23 ENCOUNTER — Encounter: Payer: Self-pay | Admitting: Family Medicine

## 2015-09-23 MED ORDER — FLUCONAZOLE 150 MG PO TABS: 150.0000 mg | ORAL_TABLET | Freq: Once | ORAL | 0 refills | Status: AC

## 2015-09-23 NOTE — Telephone Encounter (Signed)
Dr D as [name]. I already responded to patient.

## 2015-09-27 DIAGNOSIS — H1045 Other chronic allergic conjunctivitis: Secondary | ICD-10-CM | POA: Diagnosis not present

## 2015-09-27 DIAGNOSIS — R220 Localized swelling, mass and lump, head: Secondary | ICD-10-CM | POA: Diagnosis not present

## 2015-09-27 DIAGNOSIS — R21 Rash and other nonspecific skin eruption: Secondary | ICD-10-CM | POA: Diagnosis not present

## 2015-09-27 DIAGNOSIS — J309 Allergic rhinitis, unspecified: Secondary | ICD-10-CM | POA: Diagnosis not present

## 2015-10-02 HISTORY — PX: ESOPHAGOGASTRODUODENOSCOPY: SHX1529

## 2015-10-05 ENCOUNTER — Ambulatory Visit (INDEPENDENT_AMBULATORY_CARE_PROVIDER_SITE_OTHER): Payer: Medicare Other

## 2015-10-05 ENCOUNTER — Other Ambulatory Visit: Payer: Medicare Other

## 2015-10-05 VITALS — BP 110/72 | HR 79 | Temp 97.9°F | Ht 67.0 in | Wt 209.5 lb

## 2015-10-05 DIAGNOSIS — Z114 Encounter for screening for human immunodeficiency virus [HIV]: Secondary | ICD-10-CM

## 2015-10-05 DIAGNOSIS — E559 Vitamin D deficiency, unspecified: Secondary | ICD-10-CM

## 2015-10-05 DIAGNOSIS — Z1159 Encounter for screening for other viral diseases: Secondary | ICD-10-CM | POA: Diagnosis not present

## 2015-10-05 DIAGNOSIS — E7849 Other hyperlipidemia: Secondary | ICD-10-CM

## 2015-10-05 DIAGNOSIS — Z0000 Encounter for general adult medical examination without abnormal findings: Secondary | ICD-10-CM

## 2015-10-05 DIAGNOSIS — E784 Other hyperlipidemia: Secondary | ICD-10-CM | POA: Diagnosis not present

## 2015-10-05 LAB — LIPID PANEL
CHOL/HDL RATIO: 4
Cholesterol: 233 mg/dL — ABNORMAL HIGH (ref 0–200)
HDL: 56 mg/dL (ref >39.00)
LDL CALC: 159 mg/dL — AB (ref 0–99)
NONHDL: 177.01
TRIGLYCERIDES: 90 mg/dL (ref 0.0–149.0)
VLDL: 18 mg/dL (ref 0.0–40.0)

## 2015-10-05 LAB — COMPREHENSIVE METABOLIC PANEL
ALT: 19 U/L (ref 0–35)
AST: 17 U/L (ref 0–37)
Albumin: 3.9 g/dL (ref 3.5–5.2)
Alkaline Phosphatase: 57 U/L (ref 39–117)
BUN: 9 mg/dL (ref 6–23)
CALCIUM: 9.2 mg/dL (ref 8.4–10.5)
CO2: 26 meq/L (ref 19–32)
Chloride: 105 mEq/L (ref 96–112)
Creatinine, Ser: 0.87 mg/dL (ref 0.40–1.20)
GFR: 73.42 mL/min (ref >60.00)
GLUCOSE: 102 mg/dL — AB (ref 70–99)
POTASSIUM: 3.8 meq/L (ref 3.5–5.1)
Sodium: 139 mEq/L (ref 135–145)
Total Bilirubin: 0.4 mg/dL (ref 0.2–1.2)
Total Protein: 6.7 g/dL (ref 6.0–8.3)

## 2015-10-05 LAB — TSH: TSH: 8.11 u[IU]/mL — ABNORMAL HIGH (ref 0.35–4.50)

## 2015-10-05 LAB — VITAMIN D 25 HYDROXY (VIT D DEFICIENCY, FRACTURES): VITD: 25.19 ng/mL — AB (ref 30.00–100.00)

## 2015-10-05 LAB — T4, FREE: FREE T4: 0.35 ng/dL — AB (ref 0.60–1.60)

## 2015-10-05 NOTE — Progress Notes (Signed)
Subjective:   Karen Aguilar is a 50 y.o. female who presents for Medicare Annual (Subsequent) preventive examination.  Review of Systems: N/A Cardiac Risk Factors include: obesity (BMI >30kg/m2)     Objective:     Vitals: BP 110/72 (BP Location: Left Arm, Patient Position: Sitting, Cuff Size: Normal)   Pulse 79   Temp 97.9 F (36.6 C) (Oral)   Ht 5\' 7"  (1.702 m) Comment: no shoes  Wt 209 lb 8 oz (95 kg)   LMP 10/04/2014   SpO2 96%   BMI 32.81 kg/m   Body mass index is 32.81 kg/m.   Tobacco History  Smoking Status  . Never Smoker  Smokeless Tobacco  . Never Used     Counseling given: No   Past Medical History:  Diagnosis Date  . Abnormal MRI scan, bone 2012   abnormal marrow signal humeral head, glenoid and scapula - diffuse replacement of fatty marrow - likely benign, no need for further investigation ([place])  . Agnosia for temperature    Right Leg  . Anemia    etiology unknown-iron infusion in past,last 9/12  . Anxiety   . Anxiety and depression   . 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 Arthritis   . Chronic pain    pain contract with Dr. Eartha Inch Pain  . DDD (degenerative disc disease), cervical   . DDD (degenerative disc disease), lumbar    s/p permanent nerve damage after back surgery  . Depression   . Depression with anxiety   . Diverticulosis   . Eczema   . Family history of adverse reaction to anesthesia    Mother - nausea  . Fibromyalgia   . Foot drop    right - numbness, tingling - twisting  . Gallstones   . GERD (gastroesophageal reflux disease)    rare zantac PRN N/V at times with this  . GERD (gastroesophageal reflux disease)   . Headache(784.0)    migraines  . Hemorrhoid   . Hemorrhoids   . History of kidney stones   . Hyperlipidemia   . Hypothyroidism 1990s   hashimoto's thyroiditis  . IBS (irritable bowel syndrome)   . IBS (irritable bowel syndrome)   . Incontinence of urine    s/p sling procedure-unresolved    . Iron deficiency    h/o anemia, s/p iron infusion (2012)  . Liver hemangioma     x2  . Memory change    due to medications  . Migraines    and h/o optical migraine  . Migraines   . Mixed urge and stress incontinence 2008   extensive workup including urodynamics, failed miltiple anticholinergics including myrbetriq and urogesic blue s/p PTNS, normal cystoscopy 2014, seen by Dr Matilde Sprang and Dr Erlene Quan  . Neurogenic bladder 2008   after 13 spine surgeries with periph neuropathy and chronic LBP  . Neuropathy of lower extremity 2011  . Nocturia   . Notalgia paresthetica   . Osteoarthritis 12/2013  . PONV (postoperative nausea and vomiting)   . Pulmonary nodule 04/2012   7.100mm RLL nodule - resolved on imaging 12/2012  . Sleep apnea    mild-doesn't use cpap  . Urine frequency   . Vitamin D deficiency    Past Surgical History:  Procedure Laterality Date  . ANTERIOR CERVICAL DECOMP/DISCECTOMY FUSION     C3, 4, 5 - improved after surgery  . BACK SURGERY     multiple-L4-s1,hardware,fusion,removal,infection s/p 5/11 procedure  . BACK SURGERY  02/16/11   "  my 7th back OR; Procedure: Exploration of fusion removal of hardware L4-S1, harvesting of right iliac crest bone graft, redo posterior lateral fusion L4-5 using iliac crest bone graft BMP and master graft replacement of bilateral L4 screws with a Telfa tech 7.5 x 40 mm screw on the right and 8.5 x 40 mm screw on the left placement of a large Hemovac drain  . CERVICAL FUSION     x2  . [place]; 2001   x 2  . CHOLECYSTECTOMY  2001  . COLONOSCOPY    . ESOPHAGOGASTRODUODENOSCOPY  03/2013   early esophageal stricture dilated, o/w WNL Deatra Ina)  . ESOPHAGOGASTRODUODENOSCOPY  12/2014   WNL, s/p empiric dilation of esophagus Henrene Pastor)  . HEMORRHOID BANDING    . LASIK Bilateral   . LUMBAR WOUND DEBRIDEMENT  03/15/2011   Procedure: LUMBAR WOUND DEBRIDEMENT;  Surgeon: Elaina Hoops, MD;  Lumbar Wound Debridement  . MANDIBLE  RECONSTRUCTION  1986  . PFT  01/28/2013   WNL  . PUBOVAGINAL SLING  2008   midurethral sling  . SHOULDER CLOSED REDUCTION Left 12/18/2010   Procedure: CLOSED MANIPULATION SHOULDER;  Surgeon: Johnn Hai; labrial debridement  . SPINAL CORD STIMULATOR INSERTION N/A 08/19/2015   Procedure: LUMBAR SPINAL CORD STIMULATOR INSERTION;  Surgeon: Erline Levine, MD;  Location: [facility];  Service: Neurosurgery;  Laterality: N/A;  LUMBAR SPINAL CORD STIMULATOR INSERTION  . SPINE SURGERY    . TONSILLECTOMY  1972   as a child; "then they grew back"   Family History  Problem Relation Age of Onset  . Lung cancer Father 20     (smoker)  . Diabetes Brother   . Thyroid disease Mother     ?empty sella  . Irritable bowel syndrome Mother   . Dysphagia Mother   . CAD Maternal Grandfather     MI  . Heart disease Maternal Grandfather   . Heart attack Maternal Grandfather   . Lung cancer Paternal Grandfather      (smoker)  . Colon cancer Paternal Grandmother   . Thyroid disease Cousin   . Cancer Other     unsure (ovarian/uterine)  . Breast cancer Maternal Aunt   . Alzheimer's disease Maternal Grandmother   . Diabetes Daughter    History  Sexual Activity  . Sexual activity: Not Currently    Outpatient Encounter Prescriptions as of 10/05/2015  Medication Sig  . ALPRAZolam (XANAX) 0.5 MG tablet TAKE 1 TABLET (0.5 mg) BY MOUTH every eight (8) hours for anxiety due to pain  . Ascorbic Acid (VITAMIN C PO) Take 1 tablet by mouth as needed (for immune system support).   . baclofen (LIORESAL) 10 MG tablet Take 10 mg by mouth 3 (three) times daily.  . calcium carbonate (TUMS SMOOTHIES) 750 MG chewable tablet Chew 2-3 tablets by mouth 2 (two) times daily.  . camphor-menthol (SARNA) lotion Apply 1 application topically at bedtime as needed for itching.   . cetirizine (ZYRTEC) 10 MG tablet Take 40 mg by mouth daily as needed for allergies.   . clobetasol ointment (TEMOVATE) [id number] % Apply 1 application  topically as needed (for eczema).   Marland Kitchen dexlansoprazole (DEXILANT) 60 MG capsule Take 1 capsule (60 mg total) by mouth 2 (two) times daily.  . diclofenac sodium (VOLTAREN) 1 % GEL Apply 1 application topically 3 (three) times daily as needed (for bursitis).   Marland Kitchen dronabinol (MARINOL) 2.5 MG capsule Take 2.5 mg by mouth daily as needed (for nausea and pain).   Marland Kitchen  escitalopram (LEXAPRO) 10 MG tablet TAKE 1 TABLET BY MOUTH EVERY DAY (Patient taking differently: TAKE 1 TABLET (10 mg) BY MOUTH EVERY DAY)  . hydrocortisone (ANUSOL-HC) 2.5 % rectal cream Apply 1 application topically daily as needed for hemorrhoids or itching.   . hyoscyamine (LEVSIN SL) 0.125 MG SL tablet DISSOLVE 1 TABLET(0.125 MG) UNDER THE TONGUE EVERY 4 HOURS AS NEEDED (Patient taking differently: DISSOLVE 1 TABLET(0.125 MG) UNDER THE TONGUE EVERY daily as needed for IBS spasms and chest pain)  . metroNIDAZOLE (METROGEL) 1 % gel Apply 1 application topically 2 (two) times daily as needed (for rosacea acne).   . Phenylephrine-DM-GG-APAP (MUCINEX SINUS-MAX) 5-10-200-325 MG CAPS Take 2 tablets by mouth 2 (two) times daily as needed (for migraine).   . Tapentadol HCl (NUCYNTA) 100 MG TABS Take 100 mg by mouth every 8 (eight) hours.   Marland Kitchen thyroid (ARMOUR) 60 MG tablet Take 60 mg by mouth daily before breakfast.  . topiramate (TOPAMAX) 100 MG tablet TAKE 1 TABLET BY MOUTH EVERY DAY (Patient taking differently: TAKE 1 TABLET (100 mg)BY MOUTH EVERY DAY)  . [DISCONTINUED] predniSONE (DELTASONE) 20 MG tablet 2 tabs a day for 4 days, then 1 a day for 4 days, then 0.5 a day for 4 days, with food.   No facility-administered encounter medications on file as of 10/05/2015.     Activities of Daily Living In your present state of health, do you have any difficulty performing the following activities: 10/05/2015 08/16/2015  Hearing? Y N  Vision? Y N  Difficulty concentrating or making decisions? Tempie Donning  Walking or climbing stairs? Y Y  Dressing or bathing? N  N  Doing errands, shopping? Tempie Donning  Preparing Food and eating ? Y -  Using the Toilet? N -  In the past six months, have you accidently leaked urine? Y -  Do you have problems with loss of bowel control? Y -  Managing your Medications? N -  Managing your Finances? Y -  Housekeeping or managing your Housekeeping? Y -  Some recent data might be hidden    Patient Care Team: Ria Bush, MD as PCP - General (Family Medicine) Erline Levine, MD as Consulting Physician (Neurosurgery) Kary Kos, MD as Consulting Physician (Neurosurgery) Ulyses Southward, MD as Consulting Physician (Allergy and Immunology) Judi Cong, MD as Physician Assistant (Internal Medicine) Nicholaus Bloom, MD as Consulting Physician (Anesthesiology)    Assessment:     Hearing Screening   125Hz  250Hz  500Hz  1000Hz  2000Hz  3000Hz  4000Hz  6000Hz  8000Hz   Right ear:   40 40 40  40    Left ear:   40 40 40  40      Visual Acuity Screening   Right eye Left eye Both eyes  Without correction: 20/13-1 20/20 20/15  With correction:       Exercise Activities and Dietary recommendations Current Exercise Habits: Home exercise routine, Type of exercise: stretching;walking, Time (Minutes): 30, Frequency (Times/Week): 7, Weekly Exercise (Minutes/Week): 210, Intensity: Mild, Exercise limited by: orthopedic condition(s);neurologic condition(s)  Goals    . Increase physical activity          Starting 10/05/2015, I will continue to exercise at least 30 min daily.       Fall Risk Fall Risk  10/05/2015 07/15/2014  Falls in the past year? Yes Yes  Number falls in past yr: 1 2 or more  Injury with Fall? No No  Follow up Falls evaluation completed -   Depression Screen PHQ 2/9 Scores 10/05/2015  07/15/2014  PHQ - 2 Score 4 4  PHQ- 9 Score 21 12     Cognitive Testing MMSE - Mini Mental State Exam 10/05/2015  Orientation to time 5  Orientation to Place 5  Registration 3  Attention/ Calculation 0  Recall 3  Language- name 2  objects 0  Language- repeat 1  Language- follow 3 step command 3  Language- read & follow direction 0  Write a sentence 0  Copy design 0  Total score 20   PLEASE NOTE: A Mini-Cog screen was completed. Maximum score is 20. A value of 0 denotes this part of [place] MMSE was not completed or the patient failed this part of the Mini-Cog screening.   Mini-Cog Screening Orientation to Time - Max 5 pts Orientation to Place - Max 5 pts Registration - Max 3 pts Recall - Max 3 pts Language Repeat - Max 1 pts Language Follow 3 Step Command - Max 3 pts  There is no immunization history on file for this patient. Screening Tests Health Maintenance  Topic Date Due  . DTaP/Tdap/Td (1 - Tdap) 10/04/2016 (Originally [birth year]/01/1985)  . TETANUS/TDAP  10/04/2016 (Originally [birth year]/01/1985)  . PAP SMEAR  03/01/2017  . HIV Screening  Completed      Plan:     I have personally reviewed and addressed the Medicare Annual Wellness questionnaire and have noted the following in the patient's chart:  A. Medical and social history B. Use of alcohol, tobacco or illicit drugs  C. Current medications and supplements D. Functional ability and status E.  Nutritional status F.  Physical activity G. Advance directives H. List of other physicians I.  Hospitalizations, surgeries, and ER visits in previous 12 months J.  [place] to include hearing, vision, cognitive, depression L. Referrals and appointments - none  In addition, I have reviewed and discussed with patient certain preventive protocols, quality metrics, and best practice recommendations. A written personalized care plan for preventive services as well as general preventive health recommendations were provided to patient.  See attached scanned questionnaire for additional information.   Signed,   Lindell Noe, MHA, BS, LPN Health Advisor

## 2015-10-05 NOTE — Patient Instructions (Signed)
Karen Aguilar , Thank you for taking time to come for your Medicare Wellness Visit. I appreciate your ongoing commitment to your health goals. Please review the following plan we discussed and let me know if I can assist you in the future.   These are the goals we discussed: Goals    . Increase physical activity          Starting 10/05/2015, I will continue to exercise at least 30 min daily.        This is a list of the screening recommended for you and due dates:  Health Maintenance  Topic Date Due  . DTaP/Tdap/Td vaccine (1 - Tdap) 10/04/2016*  . Tetanus Vaccine  10/04/2016*  . Pap Smear  03/01/2017  . HIV Screening  Completed  *Topic was postponed. The date shown is not the original due date.   Preventive Care for Adults  A healthy lifestyle and preventive care can promote health and wellness. Preventive health guidelines for adults include the following key practices.  . A routine yearly physical is a good way to check with your health care provider about your health and preventive screening. It is a chance to share any concerns and updates on your health and to receive a thorough exam.  . Visit your dentist for a routine exam and preventive care every 6 months. Brush your teeth twice a day and floss once a day. Good oral hygiene prevents tooth decay and gum disease.  . The frequency of eye exams is based on your age, health, family medical history, use  of contact lenses, and other factors. Follow your health care provider's ecommendations for frequency of eye exams.  . Eat a healthy diet. Foods like vegetables, fruits, whole grains, low-fat dairy products, and lean protein foods contain the nutrients you need without too many calories. Decrease your intake of foods high in solid fats, added sugars, and salt. Eat the right amount of calories for you. Get information about a proper diet from your health care provider, if necessary.  . Regular physical exercise is one of the most  important things you can do for your health. Most adults should get at least 150 minutes of moderate-intensity exercise (any activity that increases your heart rate and causes you to sweat) each week. In addition, most adults need muscle-strengthening exercises on 2 or more days a week.  Silver Sneakers may be a benefit available to you. To determine eligibility, you may visit the website: www.silversneakers.com or contact program at (513) 471-1987 Mon-Fri between 8AM-8PM.   . Maintain a healthy weight. The body mass index (BMI) is a screening tool to identify possible weight problems. It provides an estimate of body fat based on height and weight. Your health care provider can find your BMI and can help you achieve or maintain a healthy weight.   For adults 20 years and older: ? A BMI below 18.5 is considered underweight. ? A BMI of 18.5 to 24.9 is normal. ? A BMI of 25 to 29.9 is considered overweight. ? A BMI of 30 and above is considered obese.   . Maintain normal blood lipids and cholesterol levels by exercising and minimizing your intake of saturated fat. Eat a balanced diet with plenty of fruit and vegetables. Blood tests for lipids and cholesterol should begin at age 75 and be repeated every 5 years. If your lipid or cholesterol levels are high, you are over 50, or you are at high risk for heart disease, you may need your  cholesterol levels checked more frequently. Ongoing high lipid and cholesterol levels should be treated with medicines if diet and exercise are not working.  . If you smoke, find out from your health care provider how to quit. If you do not use tobacco, please do not start.  . If you choose to drink alcohol, please do not consume more than 2 drinks per day. One drink is considered to be 12 ounces (355 mL) of beer, 5 ounces (148 mL) of wine, or 1.5 ounces (44 mL) of liquor.  . If you are 63-5 years old, ask your health care provider if you should take aspirin to prevent  strokes.  . Use sunscreen. Apply sunscreen liberally and repeatedly throughout the day. You should seek shade when your shadow is shorter than you. Protect yourself by wearing long sleeves, pants, a wide-brimmed hat, and sunglasses year round, whenever you are outdoors.  . Once a month, do a whole body skin exam, using a mirror to look at the skin on your back. Tell your health care provider of new moles, moles that have irregular borders, moles that are larger than a pencil eraser, or moles that have changed in shape or color.

## 2015-10-05 NOTE — Progress Notes (Signed)
Pre visit review using our clinic review tool, if applicable. No additional management support is needed unless otherwise documented below in the visit note. 

## 2015-10-05 NOTE — Progress Notes (Signed)
PCP notes:   Health maintenance:  Tdap- postponed/insurance  Abnormal screenings:   Fall risk - hx of fall without injury Depression score: 21  Patient concerns:   Pt reports GI concerns and trouble swallowing.   Nurse concerns:  During AWV, pt had intermittent episodes of tears and crying. Pt verbalized feeling depressed. Depression screening was 20 min. Pt has been scheduled an appt prior to CPE to discuss concerns with emotional health and swallowing.   Next PCP appt:   10/11/2015 @ 1115

## 2015-10-06 ENCOUNTER — Other Ambulatory Visit: Payer: Medicare Other

## 2015-10-06 ENCOUNTER — Ambulatory Visit: Payer: Medicare Other

## 2015-10-06 LAB — HEPATITIS C ANTIBODY: HCV Ab: NEGATIVE

## 2015-10-06 LAB — HIV ANTIBODY (ROUTINE TESTING W REFLEX): HIV: NONREACTIVE

## 2015-10-06 NOTE — Progress Notes (Signed)
I reviewed health advisor's note, was available for consultation, and agree with documentation and plan.  

## 2015-10-10 ENCOUNTER — Encounter: Payer: Medicare Other | Admitting: Family Medicine

## 2015-10-10 ENCOUNTER — Ambulatory Visit (INDEPENDENT_AMBULATORY_CARE_PROVIDER_SITE_OTHER): Payer: Medicare Other | Admitting: Family Medicine

## 2015-10-10 ENCOUNTER — Encounter: Payer: Self-pay | Admitting: Family Medicine

## 2015-10-10 VITALS — BP 110/70 | HR 80 | Temp 98.9°F | Wt 211.8 lb

## 2015-10-10 DIAGNOSIS — G039 Meningitis, unspecified: Secondary | ICD-10-CM

## 2015-10-10 DIAGNOSIS — Z780 Asymptomatic menopausal state: Secondary | ICD-10-CM | POA: Diagnosis not present

## 2015-10-10 DIAGNOSIS — M5136 Other intervertebral disc degeneration, lumbar region: Secondary | ICD-10-CM

## 2015-10-10 DIAGNOSIS — N3946 Mixed incontinence: Secondary | ICD-10-CM | POA: Diagnosis not present

## 2015-10-10 DIAGNOSIS — M797 Fibromyalgia: Secondary | ICD-10-CM

## 2015-10-10 DIAGNOSIS — K3184 Gastroparesis: Secondary | ICD-10-CM

## 2015-10-10 DIAGNOSIS — K589 Irritable bowel syndrome without diarrhea: Secondary | ICD-10-CM

## 2015-10-10 DIAGNOSIS — K219 Gastro-esophageal reflux disease without esophagitis: Secondary | ICD-10-CM

## 2015-10-10 DIAGNOSIS — R1310 Dysphagia, unspecified: Secondary | ICD-10-CM

## 2015-10-10 DIAGNOSIS — Z0000 Encounter for general adult medical examination without abnormal findings: Secondary | ICD-10-CM

## 2015-10-10 DIAGNOSIS — E039 Hypothyroidism, unspecified: Secondary | ICD-10-CM | POA: Diagnosis not present

## 2015-10-10 DIAGNOSIS — N319 Neuromuscular dysfunction of bladder, unspecified: Secondary | ICD-10-CM

## 2015-10-10 DIAGNOSIS — F332 Major depressive disorder, recurrent severe without psychotic features: Secondary | ICD-10-CM

## 2015-10-10 DIAGNOSIS — R1319 Other dysphagia: Secondary | ICD-10-CM

## 2015-10-10 DIAGNOSIS — M51369 Other intervertebral disc degeneration, lumbar region without mention of lumbar back pain or lower extremity pain: Secondary | ICD-10-CM

## 2015-10-10 DIAGNOSIS — Z7189 Other specified counseling: Secondary | ICD-10-CM | POA: Diagnosis not present

## 2015-10-10 DIAGNOSIS — G894 Chronic pain syndrome: Secondary | ICD-10-CM

## 2015-10-10 DIAGNOSIS — E559 Vitamin D deficiency, unspecified: Secondary | ICD-10-CM

## 2015-10-10 DIAGNOSIS — E7800 Pure hypercholesterolemia, unspecified: Secondary | ICD-10-CM

## 2015-10-10 LAB — POC URINALSYSI DIPSTICK (AUTOMATED)
Blood, UA: NEGATIVE
Glucose, UA: NEGATIVE
Ketones, UA: NEGATIVE
LEUKOCYTES UA: NEGATIVE
NITRITE UA: NEGATIVE
PH UA: 5
PROTEIN UA: NEGATIVE
Spec Grav, UA: >=1.03
UROBILINOGEN UA: 0.2

## 2015-10-10 MED ORDER — VITAMIN D 50 MCG (2000 UT) PO CAPS: 1.0000 | ORAL_CAPSULE | Freq: Every day | ORAL | Status: DC

## 2015-10-10 NOTE — Assessment & Plan Note (Signed)
Pain managed by Dr Hardin Negus of [facility].  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 Latest treatment spinal cord stimulator with modest effect.

## 2015-10-10 NOTE — Assessment & Plan Note (Signed)
Update UA today. PTNS did not help. Insurance did not cover urogesic blue.

## 2015-10-10 NOTE — Assessment & Plan Note (Signed)
Advised restart 2000 IU daily.

## 2015-10-10 NOTE — Assessment & Plan Note (Signed)
Continue levsin PRN

## 2015-10-10 NOTE — Progress Notes (Signed)
Pre visit review using our clinic review tool, if applicable. No additional management support is needed unless otherwise documented below in the visit note. 

## 2015-10-10 NOTE — Assessment & Plan Note (Signed)
Continue dexilant 40mg  BID.

## 2015-10-10 NOTE — Assessment & Plan Note (Signed)
Ongoing and worsening per patient, states allergist was concerned for eosinophilic esophagitis. Pt desires second opinion, will place GI referral. Last EGD 12/2014 WNL, s/p empiric dilation.  Anticipate some anxiety contribution. No thyromegaly appreciated today.

## 2015-10-10 NOTE — Assessment & Plan Note (Signed)
Spine stimulator placed this year.

## 2015-10-10 NOTE — Assessment & Plan Note (Signed)
Long discussion regarding hypothyroidism as well as thyroid relation to mood, cholesterol, fatigue. She has established with endocrinology Dr Gabriel Carina, appreciate her care. Armour thyroid dose had latest been increased to 60mg  daily with extra 2 tablets per week, but pt states she went back to 60mg  dose in the last few months "worred I was taking too much". TSH remains high (8.11, normal range 0.35-4.5) - pt agrees to return to prior agreed upon dose of 60mg  armour thyroid daily with 2 days a week at 120mg  and will f/u with endo at previously scheduled appointment.

## 2015-10-10 NOTE — Patient Instructions (Addendum)
Urinalysis today. Check on cost of Tdap with [employer].  Increase armour thyroid to 2 extra tablets per week. We will work towards TSH control and then re-evaluate mood. Restart vitamin D 2000 units daily. Return in 1-2 months for follow up visit.   Menopause is a normal process in which your reproductive ability comes to an end. This process happens gradually over a span of months to years, usually between the ages of 47 and 36. Menopause is complete when you have missed 12 consecutive menstrual periods. It is important to talk with your health care provider about some of the most common conditions that affect postmenopausal women, such as heart disease, cancer, and bone loss (osteoporosis). Adopting a healthy lifestyle and getting preventive care can help to promote your health and wellness. Those actions can also lower your chances of developing some of these common conditions. WHAT SHOULD I KNOW ABOUT MENOPAUSE? During menopause, you may experience a number of symptoms, such as:  Moderate-to-severe hot flashes.  Night sweats.  Decrease in sex drive.  Mood swings.  Headaches.  Tiredness.  Irritability.  Memory problems.  Insomnia. Choosing to treat or not to treat menopausal changes is an individual decision that you make with your health care provider. WHAT SHOULD I KNOW ABOUT HORMONE REPLACEMENT THERAPY AND SUPPLEMENTS? Hormone therapy products are effective for treating symptoms that are associated with menopause, such as hot flashes and night sweats. Hormone replacement carries certain risks, especially as you become older. If you are thinking about using estrogen or estrogen with progestin treatments, discuss the benefits and risks with your health care provider. WHAT SHOULD I KNOW ABOUT HEART DISEASE AND STROKE? Heart disease, heart attack, and stroke become more likely as you age. This may be due, in part, to the hormonal changes that your body experiences during menopause.  These can affect how your body processes dietary fats, triglycerides, and cholesterol. Heart attack and stroke are both medical emergencies. There are many things that you can do to help prevent heart disease and stroke:  Have your blood pressure checked at least every 1-2 years. High blood pressure causes heart disease and increases the risk of stroke.  If you are 103-30 years old, ask your health care provider if you should take aspirin to prevent a heart attack or a stroke.  Do not use any tobacco products, including cigarettes, chewing tobacco, or electronic cigarettes. If you need help quitting, ask your health care provider.  It is important to eat a healthy diet and maintain a healthy weight.  Be sure to include plenty of vegetables, fruits, low-fat dairy products, and lean protein.  Avoid eating foods that are high in solid fats, added sugars, or salt (sodium).  Get regular exercise. This is one of the most important things that you can do for your health.  Try to exercise for at least 150 minutes each week. The type of exercise that you do should increase your heart rate and make you sweat. This is known as moderate-intensity exercise.  Try to do strengthening exercises at least twice each week. Do these in addition to the moderate-intensity exercise.  Know your numbers.Ask your health care provider to check your cholesterol and your blood glucose. Continue to have your blood tested as directed by your health care provider. WHAT SHOULD I KNOW ABOUT CANCER SCREENING? There are several types of cancer. Take the following steps to reduce your risk and to catch any cancer development as early as possible. Breast Cancer  Practice  breast self-awareness.  This means understanding how your breasts normally appear and feel.  It also means doing regular breast self-exams. Let your health care provider know about any changes, no matter how small.  If you are 58 or older, have a  clinician do a breast exam (clinical breast exam or CBE) every year. Depending on your age, family history, and medical history, it may be recommended that you also have a yearly breast X-ray (mammogram).  If you have a family history of breast cancer, talk with your health care provider about genetic screening.  If you are at high risk for breast cancer, talk with your health care provider about having an MRI and a mammogram every year.  Breast cancer (BRCA) gene test is recommended for women who have family members with BRCA-related cancers. Results of the assessment will determine the need for genetic counseling and BRCA1 and for BRCA2 testing. BRCA-related cancers include these types:  Breast. This occurs in males or females.  Ovarian.  Tubal. This may also be called fallopian tube cancer.  Cancer of the abdominal or pelvic lining (peritoneal cancer).  Prostate.  Pancreatic. Cervical, Uterine, and Ovarian Cancer Your health care provider may recommend that you be screened regularly for cancer of the pelvic organs. These include your ovaries, uterus, and vagina. This screening involves a pelvic exam, which includes checking for microscopic changes to the surface of your cervix (Pap test).  For women ages 21-65, health care providers may recommend a pelvic exam and a Pap test every three years. For women ages 82-65, they may recommend the Pap test and pelvic exam, combined with testing for human papilloma virus (HPV), every five years. Some types of HPV increase your risk of cervical cancer. Testing for HPV may also be done on women of any age who have unclear Pap test results.  Other health care providers may not recommend any screening for nonpregnant women who are considered low risk for pelvic cancer and have no symptoms. Ask your health care provider if a screening pelvic exam is right for you.  If you have had past treatment for cervical cancer or a condition that could lead to  cancer, you need Pap tests and screening for cancer for at least 20 years after your treatment. If Pap tests have been discontinued for you, your risk factors (such as having a new sexual partner) need to be reassessed to determine if you should start having screenings again. Some women have medical problems that increase the chance of getting cervical cancer. In these cases, your health care provider may recommend that you have screening and Pap tests more often.  If you have a family history of uterine cancer or ovarian cancer, talk with your health care provider about genetic screening.  If you have vaginal bleeding after reaching menopause, tell your health care provider.  There are currently no reliable tests available to screen for ovarian cancer. Lung Cancer Lung cancer screening is recommended for adults 47-91 years old who are at high risk for lung cancer because of a history of smoking. A yearly low-dose CT scan of the lungs is recommended if you:  Currently smoke.  Have a history of at least 30 pack-years of smoking and you currently smoke or have quit within the past 15 years. A pack-year is smoking an average of one pack of cigarettes per day for one year. Yearly screening should:  Continue until it has been 15 years since you quit.  Stop if you develop  a health problem that would prevent you from having lung cancer treatment. Colorectal Cancer  This type of cancer can be detected and can often be prevented.  Routine colorectal cancer screening usually begins at age 37 and continues through age 20.  If you have risk factors for colon cancer, your health care provider may recommend that you be screened at an earlier age.  If you have a family history of colorectal cancer, talk with your health care provider about genetic screening.  Your health care provider may also recommend using home test kits to check for hidden blood in your stool.  A small camera at the end of a tube  can be used to examine your colon directly (sigmoidoscopy or colonoscopy). This is done to check for the earliest forms of colorectal cancer.  Direct examination of the colon should be repeated every 5-10 years until age 7. However, if early forms of precancerous polyps or small growths are found or if you have a family history or genetic risk for colorectal cancer, you may need to be screened more often. Skin Cancer  Check your skin from head to toe regularly.  Monitor any moles. Be sure to tell your health care provider:  About any new moles or changes in moles, especially if there is a change in a mole's shape or color.  If you have a mole that is larger than the size of a pencil eraser.  If any of your family members has a history of skin cancer, especially at a young age, talk with your health care provider about genetic screening.  Always use sunscreen. Apply sunscreen liberally and repeatedly throughout the day.  Whenever you are outside, protect yourself by wearing long sleeves, pants, a wide-brimmed hat, and sunglasses. WHAT SHOULD I KNOW ABOUT OSTEOPOROSIS? Osteoporosis is a condition in which bone destruction happens more quickly than new bone creation. After menopause, you may be at an increased risk for osteoporosis. To help prevent osteoporosis or the bone fractures that can happen because of osteoporosis, the following is recommended:  If you are 11-15 years old, get at least 1,000 mg of calcium and at least 600 mg of vitamin D per day.  If you are older than age 75 but younger than age 68, get at least 1,200 mg of calcium and at least 600 mg of vitamin D per day.  If you are older than age 59, get at least 1,200 mg of calcium and at least 800 mg of vitamin D per day. Smoking and excessive alcohol intake increase the risk of osteoporosis. Eat foods that are rich in calcium and vitamin D, and do weight-bearing exercises several times each week as directed by your health care  provider. WHAT SHOULD I KNOW ABOUT HOW MENOPAUSE AFFECTS [place]? Depression may occur at any age, but it is more common as you become older. Common symptoms of depression include:  Low or sad mood.  Changes in sleep patterns.  Changes in appetite or eating patterns.  Feeling an overall lack of motivation or enjoyment of activities that you previously enjoyed.  Frequent crying spells. Talk with your health care provider if you think that you are experiencing depression. WHAT SHOULD I KNOW ABOUT IMMUNIZATIONS? It is important that you get and maintain your immunizations. These include:  Tetanus, diphtheria, and pertussis (Tdap) booster vaccine.  Influenza every year before the flu season begins.  Pneumonia vaccine.  Shingles vaccine. Your health care provider may also recommend other immunizations.  This information is not intended to replace advice given to you by your health care provider. Make sure you discuss any questions you have with your health care provider.   Document Released: 02/09/2005 Document Revised: 01/08/2014 Document Reviewed: 08/20/2013 Elsevier Interactive Patient Education [employer].

## 2015-10-10 NOTE — Assessment & Plan Note (Signed)
Received advanced directives 09/2014, scanned. HCPOA is [place]et then mother Harrie Jeans. Does not want life prolonging measures if terminally ill.

## 2015-10-10 NOTE — Progress Notes (Signed)
BP 110/70   Pulse 80   Temp 98.9 F (37.2 C) (Oral)   Wt 211 lb 12 oz (96 kg)   LMP 10/04/2014   BMI 33.16 kg/m    CC: CPE and discuss other concerns Subjective:    Patient ID: Karen Aguilar, female    DOB: [birth year], 50 y.o.   MRN: [id number]  HPI: Karen Aguilar is a 50 y.o. female presenting on 10/10/2015 for Annual Exam   Saw Katha Cabal last week for medicare wellness visit, note reviewed. Significant depression as well as some trouble swallowing endorsed despite dexilant 60mg  bid and now dronabinol 2.5mg  PRN nausea/pain and levsin SL PRN. Followed by Dr Henrene Pastor GI. Latest esophagram with mildly decreased esophageal motility and incidental duodenal diverticula. 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 Currently on nucynta. Concern for eosinophilic esophagitis. Trying to get in to see Dr Collene Mares for second opinion.   PQH9 = 21/27 at [facility]. Increased stress noted over last several weeks in h/o depression controlled on lexapro 10mg  daily and xanax 0.5mg  TID PRN. Denies SI/HI.  Recent hives of unclear etiology after spinal cord stimulator unit placed s/p eval by our office, allergist and dermatologist.  Hypothyroidism - followed by Dr Gabriel Carina for this - taking armour thyroid 60mg  daily with 120mg  twice weekly. She is actually taking 1 tablet daily.  Longstanding urinary urgency with some incontinence. Chronic bladder spasms per patient. PTNS didn't help. Baclofen helps. Urgency treatment unable to be approved through [employer].   Preventative: Colonoscopy - per chart 2008 and normal, no report available Well woman: Dr. Rance Muir OBG[facility]. Normal pap 05/2014. Pending establishing with new OBGYN 05/2016. Post-menopausal confirmed by hormone testing. Tried HRT by Dr Gabriel Carina with worsening pain levels.  Mammogram 05/2014 normal per report. Will rpt next year. Self breast exams at home.  LMP sometime 2016 - hormone  confirmed menopause.  Flu shot - declines due to thimerosal concerns  Tetanus - declines due to coverage  Advanced planning - Received advanced directives 09/2014, scanned. HCPOA is Jimmy Picket then mother Harrie Jeans. Does not want life prolonging measures if terminally ill.  Seat belt use discussed Sunscreen use discussed. No changing moles on skin. Dentist - twice yearly No smoker Alcohol - rarely  Lives with husband and 2 daughters (one in college) Occupation: was Public affairs consultant Disability since 2011 - arachnoiditis Edu: associate's degree Activity: severe limitations 2/2 arachnoiditis and chronic pain, walks to end of driveway and back Diet: good water, fruits/vegetables daily  Relevant past medical, surgical, family and social history reviewed and updated as indicated. Interim medical history since our last visit reviewed. Allergies and medications reviewed and updated. Current Outpatient Prescriptions on File Prior to Visit  Medication Sig  . ALPRAZolam (XANAX) 0.5 MG tablet TAKE 1 TABLET (0.5 mg) BY MOUTH every eight (8) hours for anxiety due to pain  . Ascorbic Acid (VITAMIN C PO) Take 1 tablet by mouth as needed (for immune system support).   . baclofen (LIORESAL) 10 MG tablet Take 10 mg by mouth 3 (three) times daily.  . calcium carbonate (TUMS SMOOTHIES) 750 MG chewable tablet Chew 2-3 tablets by mouth 2 (two) times daily.  . camphor-menthol (SARNA) lotion Apply 1 application topically at bedtime as needed for itching.   . clobetasol ointment (TEMOVATE) AB-123456789 % Apply 1 application topically as needed (for eczema).   Marland Kitchen dexlansoprazole (DEXILANT) 60 MG capsule Take 1 capsule (60 mg total) by mouth  2 (two) times daily.  . diclofenac sodium (VOLTAREN) 1 % GEL Apply 1 application topically 3 (three) times daily as needed (for bursitis).   Marland Kitchen dronabinol (MARINOL) 2.5 MG capsule Take 2.5 mg by mouth daily as needed (for nausea and pain).   Marland Kitchen escitalopram (LEXAPRO) 10  MG tablet TAKE 1 TABLET BY MOUTH EVERY DAY  . hydrocortisone (ANUSOL-HC) 2.5 % rectal cream Apply 1 application topically daily as needed for hemorrhoids or itching.   . hyoscyamine (LEVSIN SL) 0.125 MG SL tablet DISSOLVE 1 TABLET(0.125 MG) UNDER THE TONGUE EVERY 4 HOURS AS NEEDED  . metroNIDAZOLE (METROGEL) 1 % gel Apply 1 application topically 2 (two) times daily as needed (for rosacea acne).   . Phenylephrine-DM-GG-APAP (MUCINEX SINUS-MAX) 5-10-200-325 MG CAPS Take 2 tablets by mouth 2 (two) times daily as needed (for migraine).   . Tapentadol HCl (NUCYNTA) 100 MG TABS Take 100 mg by mouth every 8 (eight) hours.   . topiramate (TOPAMAX) 100 MG tablet TAKE 1 TABLET BY MOUTH EVERY DAY  . thyroid (ARMOUR) 60 MG tablet Take 1 tablet (60 mg total) by mouth daily before breakfast. Two days a week take extra tablet daily   No current facility-administered medications on file prior to visit.     Review of Systems  Constitutional: Negative for activity change, appetite change, chills, fatigue, fever and unexpected weight change.       Night sweats  HENT: Negative for hearing loss.   Eyes: Negative for visual disturbance.  Respiratory: Negative for cough, chest tightness, shortness of breath and wheezing.   Cardiovascular: Negative for chest pain, palpitations and leg swelling.  Gastrointestinal: Positive for abdominal pain (and cramping), diarrhea and nausea. Negative for abdominal distention, blood in stool (none in the past year), constipation and vomiting.       Dysphagia  Genitourinary: Positive for frequency and urgency. Negative for difficulty urinating, dysuria and hematuria.  Musculoskeletal: Negative for arthralgias, myalgias and neck pain.  Skin: Positive for rash (hives - see HPI).  Neurological: Positive for dizziness and headaches (migraines). Negative for seizures and syncope.  Hematological: Negative for adenopathy. Does not bruise/bleed easily.  Psychiatric/Behavioral: Positive  for dysphoric mood. The patient is nervous/anxious.    Per HPI unless specifically indicated in ROS section     Objective:    BP 110/70   Pulse 80   Temp 98.9 F (37.2 C) (Oral)   Wt 211 lb 12 oz (96 kg)   LMP 10/04/2014   BMI 33.16 kg/m   Wt Readings from Last 3 Encounters:  10/10/15 211 lb 12 oz (96 kg)  10/10/15 211 lb 12 oz (96 kg)  10/10/15 211 lb 12 oz (96 kg)    Physical Exam  Constitutional: She is oriented to person, place, and time. She appears well-developed and well-nourished. No distress.  HENT:  Head: Normocephalic and atraumatic.  Right Ear: Hearing, tympanic membrane, external ear and ear canal normal.  Left Ear: Hearing, tympanic membrane, external ear and ear canal normal.  Nose: Nose normal.  Mouth/Throat: Uvula is midline, oropharynx is clear and moist and mucous membranes are normal. No oropharyngeal exudate, posterior oropharyngeal edema or posterior oropharyngeal erythema.  Eyes: Conjunctivae and EOM are normal. Pupils are equal, round, and reactive to light. No scleral icterus.  Neck: Normal range of motion. Neck supple. No thyromegaly present.  Cardiovascular: Normal rate, regular rhythm, normal heart sounds and intact distal pulses.   No murmur heard. Pulses:      Radial pulses are 2+ on  the right side, and 2+ on the left side.  Pulmonary/Chest: Effort normal and breath sounds normal. No respiratory distress. She has no wheezes. She has no rales.  Abdominal: Soft. Bowel sounds are normal. She exhibits no distension and no mass. There is no tenderness. There is no rebound and no guarding.  Musculoskeletal: Normal range of motion. She exhibits no edema.  Lymphadenopathy:    She has no cervical adenopathy.  Neurological: She is alert and oriented to person, place, and time.  CN grossly intact, station and gait intact  Skin: Skin is warm and dry. No rash noted.  Psychiatric: She has a normal mood and affect. Her behavior is normal. Judgment and thought  content normal.  Nursing note and vitals reviewed.  Results for orders placed or performed in visit on 10/05/15  Lipid Panel  Result Value Ref Range   Cholesterol 233 (H) 0 - 200 mg/dL   Triglycerides 90.0 0.0 - 149.0 mg/dL   HDL 56.00 >39.00 mg/dL   VLDL 18.0 0.0 - 40.0 mg/dL   LDL Cholesterol 159 (H) 0 - 99 mg/dL   Total CHOL/HDL Ratio 4    NonHDL 177.01   Comprehensive metabolic panel  Result Value Ref Range   Sodium 139 135 - 145 mEq/L   Potassium 3.8 3.5 - 5.1 mEq/L   Chloride 105 96 - 112 mEq/L   CO2 26 19 - 32 mEq/L   Glucose, Bld 102 (H) 70 - 99 mg/dL   BUN 9 6 - 23 mg/dL   Creatinine, Ser 0.87 0.40 - 1.20 mg/dL   Total Bilirubin 0.4 0.2 - 1.2 mg/dL   Alkaline Phosphatase 57 39 - 117 U/L   AST 17 0 - 37 U/L   ALT 19 0 - 35 U/L   Total Protein 6.7 6.0 - 8.3 g/dL   Albumin 3.9 3.5 - 5.2 g/dL   Calcium 9.2 8.4 - 10.5 mg/dL   GFR 73.42 >60.00 mL/min  TSH  Result Value Ref Range   TSH 8.11 (H) 0.35 - 4.50 uIU/mL  HIV antibody (with reflex)  Result Value Ref Range   HIV 1&2 Ab, 4th Generation NONREACTIVE NONREACTIVE  Hepatitis C antibody  Result Value Ref Range   HCV Ab NEGATIVE NEGATIVE  Vitamin D, 25-hydroxy  Result Value Ref Range   VITD 25.19 (L) 30.00 - 100.00 ng/mL  T4, Free  Result Value Ref Range   Free T4 0.35 (L) 0.60 - 1.60 ng/dL      Assessment & Plan:   Problem List Items Addressed This Visit    Advanced care planning/counseling discussion    Received advanced directives 09/2014, scanned. HCPOA is Jimmy Picket then mother Harrie Jeans. Does not want life prolonging measures if terminally ill.       Arachnoiditis    Contributes to chronic pain.      Chronic pain syndrome    Pain managed by Dr Hardin Negus of [facility].  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 Latest treatment spinal cord stimulator with modest effect.      DDD (degenerative disc disease), lumbar    Spine stimulator placed this year.        Esophageal dysphagia    Ongoing and worsening per patient, states allergist was concerned for eosinophilic esophagitis. Pt desires second opinion, will place GI referral. Last EGD 12/2014 WNL, s/p empiric dilation.  Anticipate some anxiety contribution. No thyromegaly appreciated today.       Relevant Orders   Ambulatory referral to Gastroenterology  Fibromyalgia    Ongoing struggle.       Gastroparesis    Attributed to narcotic therapy by GI, pt desires second opinion - will refer.       GERD (gastroesophageal reflux disease)    Continue dexilant 40mg  BID.      Health maintenance examination - Primary    Preventative protocols reviewed and updated unless pt declined. Discussed healthy diet and lifestyle.       Hyperlipidemia    Reviewed FLP with patient. Encouraged increased legumes in effort to lower LDL.  ASCVD 10 yr risk = 1%      Hypothyroidism    Long discussion regarding hypothyroidism as well as thyroid relation to mood, cholesterol, fatigue. She has established with endocrinology Dr Gabriel Carina, appreciate her care. Armour thyroid dose had latest been increased to 60mg  daily with extra 2 tablets per week, but pt states she went back to 60mg  dose in the last few months "worred I was taking too much". TSH remains high (8.11, normal range 0.35-4.5) - pt agrees to return to prior agreed upon dose of 60mg  armour thyroid daily with 2 days a week at 120mg  and will f/u with endo at previously scheduled appointment.       Irritable bowel syndrome    Continue levsin PRN      MDD (major depressive disorder), recurrent episode, severe ([place])    With anxiety. Recent eval by our health coach with [facility] = 21/27.  On low dose lexapro 10mg  daily and xanax 0.5mg  scheduled TID. Hesitant for adjuvant medication or increased SSRI dose due to possibility of serotonin syndrome with nucynta. Declined counseling referral.  Will work towards normalization of TSH and vitamin D then reassess mood  control in 1-2 months for f/u. Denies SI/HI.      Mixed urge and stress incontinence    Update UA today. PTNS did not help. Insurance did not cover urogesic blue.      Neurogenic bladder    Update UA today.      Post-menopausal    Hormonally confirmed per patient. Discussed relation of menopause to some of the symptoms she has been experiencing. Did not tolerate recent attempt at HRT.       Vitamin D deficiency    Advised restart 2000 IU daily.       Other Visit Diagnoses   None.      Follow up plan: Return in about 2 months (around 12/10/2015), or as needed, for follow up visit.  Ria Bush, MD

## 2015-10-10 NOTE — Assessment & Plan Note (Signed)
Reviewed FLP with patient. Encouraged increased legumes in effort to lower LDL.  ASCVD 10 yr risk = 1%

## 2015-10-10 NOTE — Assessment & Plan Note (Signed)
Contributes to chronic pain.  

## 2015-10-10 NOTE — Assessment & Plan Note (Addendum)
Ongoing struggle.

## 2015-10-10 NOTE — Assessment & Plan Note (Signed)
Attributed to narcotic therapy by GI, pt desires second opinion - will refer.

## 2015-10-10 NOTE — Assessment & Plan Note (Signed)
Hormonally confirmed per patient. Discussed relation of menopause to some of the symptoms she has been experiencing. Did not tolerate recent attempt at HRT.

## 2015-10-10 NOTE — Assessment & Plan Note (Signed)
Preventative protocols reviewed and updated unless pt declined. Discussed healthy diet and lifestyle.  

## 2015-10-10 NOTE — Assessment & Plan Note (Signed)
Update UA today. 

## 2015-10-10 NOTE — Assessment & Plan Note (Signed)
With anxiety. Recent eval by our health coach with [facility] = 21/27.  On low dose lexapro 10mg  daily and xanax 0.5mg  scheduled TID. Hesitant for adjuvant medication or increased SSRI dose due to possibility of serotonin syndrome with nucynta. Declined counseling referral.  Will work towards normalization of TSH and vitamin D then reassess mood control in 1-2 months for f/u. Denies SI/HI.

## 2015-10-10 NOTE — Addendum Note (Signed)
Addended by: Royann Shivers A on: 10/10/2015 02:37 PM   Modules accepted: Orders

## 2015-10-12 ENCOUNTER — Encounter: Payer: Self-pay | Admitting: Family Medicine

## 2015-10-12 DIAGNOSIS — F332 Major depressive disorder, recurrent severe without psychotic features: Secondary | ICD-10-CM

## 2015-10-17 DIAGNOSIS — M961 Postlaminectomy syndrome, not elsewhere classified: Secondary | ICD-10-CM | POA: Diagnosis not present

## 2015-10-17 DIAGNOSIS — G894 Chronic pain syndrome: Secondary | ICD-10-CM | POA: Diagnosis not present

## 2015-10-17 DIAGNOSIS — Z79891 Long term (current) use of opiate analgesic: Secondary | ICD-10-CM | POA: Diagnosis not present

## 2015-10-17 DIAGNOSIS — M62831 Muscle spasm of calf: Secondary | ICD-10-CM | POA: Diagnosis not present

## 2015-10-19 ENCOUNTER — Other Ambulatory Visit: Payer: Self-pay | Admitting: Family Medicine

## 2015-10-19 DIAGNOSIS — K219 Gastro-esophageal reflux disease without esophagitis: Secondary | ICD-10-CM | POA: Diagnosis not present

## 2015-10-19 DIAGNOSIS — K3184 Gastroparesis: Secondary | ICD-10-CM | POA: Diagnosis not present

## 2015-10-19 DIAGNOSIS — R1310 Dysphagia, unspecified: Secondary | ICD-10-CM | POA: Diagnosis not present

## 2015-10-24 ENCOUNTER — Ambulatory Visit (INDEPENDENT_AMBULATORY_CARE_PROVIDER_SITE_OTHER): Payer: No Typology Code available for payment source | Admitting: Psychology

## 2015-10-24 DIAGNOSIS — F332 Major depressive disorder, recurrent severe without psychotic features: Secondary | ICD-10-CM | POA: Diagnosis not present

## 2015-10-24 DIAGNOSIS — L508 Other urticaria: Secondary | ICD-10-CM | POA: Diagnosis not present

## 2015-10-27 ENCOUNTER — Other Ambulatory Visit: Payer: Self-pay | Admitting: Internal Medicine

## 2015-10-27 ENCOUNTER — Encounter: Payer: Self-pay | Admitting: Family Medicine

## 2015-10-27 DIAGNOSIS — K219 Gastro-esophageal reflux disease without esophagitis: Secondary | ICD-10-CM | POA: Diagnosis not present

## 2015-10-27 DIAGNOSIS — K228 Other specified diseases of esophagus: Secondary | ICD-10-CM | POA: Diagnosis not present

## 2015-10-27 DIAGNOSIS — R1310 Dysphagia, unspecified: Secondary | ICD-10-CM | POA: Diagnosis not present

## 2015-10-27 DIAGNOSIS — K209 Esophagitis, unspecified: Secondary | ICD-10-CM | POA: Diagnosis not present

## 2015-11-03 ENCOUNTER — Ambulatory Visit (INDEPENDENT_AMBULATORY_CARE_PROVIDER_SITE_OTHER): Payer: No Typology Code available for payment source | Admitting: Psychology

## 2015-11-03 DIAGNOSIS — F332 Major depressive disorder, recurrent severe without psychotic features: Secondary | ICD-10-CM

## 2015-11-10 ENCOUNTER — Ambulatory Visit (INDEPENDENT_AMBULATORY_CARE_PROVIDER_SITE_OTHER): Payer: Medicare Other | Admitting: Psychology

## 2015-11-10 DIAGNOSIS — F332 Major depressive disorder, recurrent severe without psychotic features: Secondary | ICD-10-CM

## 2015-11-12 ENCOUNTER — Encounter: Payer: Self-pay | Admitting: Family Medicine

## 2015-11-12 DIAGNOSIS — E039 Hypothyroidism, unspecified: Secondary | ICD-10-CM

## 2015-11-12 DIAGNOSIS — E559 Vitamin D deficiency, unspecified: Secondary | ICD-10-CM

## 2015-11-12 NOTE — Telephone Encounter (Signed)
plz place on Monday lab schedule. thanks

## 2015-11-14 ENCOUNTER — Other Ambulatory Visit (INDEPENDENT_AMBULATORY_CARE_PROVIDER_SITE_OTHER): Payer: Medicare Other

## 2015-11-14 DIAGNOSIS — E039 Hypothyroidism, unspecified: Secondary | ICD-10-CM

## 2015-11-14 DIAGNOSIS — M961 Postlaminectomy syndrome, not elsewhere classified: Secondary | ICD-10-CM | POA: Diagnosis not present

## 2015-11-14 DIAGNOSIS — M62831 Muscle spasm of calf: Secondary | ICD-10-CM | POA: Diagnosis not present

## 2015-11-14 DIAGNOSIS — G894 Chronic pain syndrome: Secondary | ICD-10-CM | POA: Diagnosis not present

## 2015-11-14 DIAGNOSIS — Z79891 Long term (current) use of opiate analgesic: Secondary | ICD-10-CM | POA: Diagnosis not present

## 2015-11-14 DIAGNOSIS — E559 Vitamin D deficiency, unspecified: Secondary | ICD-10-CM

## 2015-11-14 LAB — TSH: TSH: 3.42 u[IU]/mL (ref 0.35–4.50)

## 2015-11-14 LAB — VITAMIN D 25 HYDROXY (VIT D DEFICIENCY, FRACTURES): VITD: 31.13 ng/mL (ref 30.00–100.00)

## 2015-11-14 LAB — T4, FREE: Free T4: 0.4 ng/dL — ABNORMAL LOW (ref 0.60–1.60)

## 2015-11-17 ENCOUNTER — Ambulatory Visit (INDEPENDENT_AMBULATORY_CARE_PROVIDER_SITE_OTHER): Payer: Medicare Other | Admitting: Psychology

## 2015-11-17 DIAGNOSIS — F332 Major depressive disorder, recurrent severe without psychotic features: Secondary | ICD-10-CM | POA: Diagnosis not present

## 2015-11-18 ENCOUNTER — Ambulatory Visit (INDEPENDENT_AMBULATORY_CARE_PROVIDER_SITE_OTHER): Payer: Medicare Other | Admitting: Family Medicine

## 2015-11-18 ENCOUNTER — Encounter: Payer: Self-pay | Admitting: Family Medicine

## 2015-11-18 VITALS — BP 110/60 | HR 76 | Temp 98.0°F | Wt 209.5 lb

## 2015-11-18 DIAGNOSIS — E559 Vitamin D deficiency, unspecified: Secondary | ICD-10-CM

## 2015-11-18 DIAGNOSIS — R1319 Other dysphagia: Secondary | ICD-10-CM

## 2015-11-18 DIAGNOSIS — F332 Major depressive disorder, recurrent severe without psychotic features: Secondary | ICD-10-CM | POA: Diagnosis not present

## 2015-11-18 DIAGNOSIS — E039 Hypothyroidism, unspecified: Secondary | ICD-10-CM | POA: Diagnosis not present

## 2015-11-18 DIAGNOSIS — R1310 Dysphagia, unspecified: Secondary | ICD-10-CM

## 2015-11-18 DIAGNOSIS — L509 Urticaria, unspecified: Secondary | ICD-10-CM

## 2015-11-18 DIAGNOSIS — G43809 Other migraine, not intractable, without status migrainosus: Secondary | ICD-10-CM | POA: Diagnosis not present

## 2015-11-18 MED ORDER — PROMETHAZINE HCL 25 MG PO TABS: 25.0000 mg | ORAL_TABLET | Freq: Once | ORAL | Status: DC

## 2015-11-18 MED ORDER — KETOROLAC TROMETHAMINE 30 MG/ML IJ SOLN
30.0000 mg | Freq: Once | INTRAMUSCULAR | Status: AC
  Administered 2015-11-18: 30 mg via INTRAMUSCULAR

## 2015-11-18 MED ORDER — PROMETHAZINE HCL 25 MG/ML IJ SOLN
25.0000 mg | Freq: Once | INTRAMUSCULAR | Status: AC
  Administered 2015-11-18: 25 mg via INTRAMUSCULAR

## 2015-11-18 NOTE — Assessment & Plan Note (Signed)
Stable period. She has established with Caroline Sauger one of our counselors. Declines med changes at this time. Will reassess next visit.

## 2015-11-18 NOTE — Assessment & Plan Note (Signed)
Levels normalizing on armour thyroid 60mg  daily with extra 60mg  twice weekly. Pt has f/u already scheduled with Dr Gabriel Carina for next month.

## 2015-11-18 NOTE — Assessment & Plan Note (Signed)
Presumed from recent implanted device. Zyrtec overly sedating. Discussed starting xyzal daily.

## 2015-11-18 NOTE — Assessment & Plan Note (Addendum)
Current active migraine - treat with toradol/phenergan IM. Pt asks about oxygen therapy for migraines - will defer for now.  Continue topamax 100mg  daily prophylaxis.

## 2015-11-18 NOTE — Progress Notes (Signed)
Pre visit review using our clinic review tool, if applicable. No additional management support is needed unless otherwise documented below in the visit note. 

## 2015-11-18 NOTE — Patient Instructions (Addendum)
Sign release for records from Dr Benson Norway Toradol 30mg Nada Libman 25mg  shot today Try xyzal 5mg  daily in place of zyrtec for hives.  Keep follow up with Dr Gabriel Carina  Return to [place] in 3 months for follow up visit

## 2015-11-18 NOTE — Progress Notes (Signed)
BP 110/60   Pulse 76   Temp 98 F (36.7 C) (Oral)   Wt 209 lb 8 oz (95 kg)   LMP 10/04/2014   BMI 32.81 kg/m    CC: f/u visit Subjective:    Patient ID: Karen Aguilar, female    DOB: 03-May-1965, 50 y.o.   MRN: [id number]  HPI: Karen Aguilar is a 50 y.o. female presenting on 11/18/2015 for Follow-up   See prior note for details. Last visit she increased armour thyroid dose to 60mg  daily with 2 extra doses per week. TSH now normal, free T4 remaining mildly low. Has f/u with Dr Gabriel Carina scheduled.   Vit D deficiency - restarted 2000 IU daily and levels have been repleted.   1 wk h/o headache described as typical migraines - blurry vision, sensitive hearing, activity limiting. On topamax 100mg  daily preventative. Pt states she also carries diagnosis of cluster headache. Has tolerated toradol and phenergan well.   Has established with B[facility] our counselor.   Saw Dr Benson Norway s/p esophageal biopsy - I do not have records of this.  Ongoing hives after electrical stimulator placement - worse with charging of battery. Zyrtec overly sedating   Relevant past medical, surgical, family and social history reviewed and updated as indicated. Interim medical history since our last visit reviewed. Allergies and medications reviewed and updated. Current Outpatient Prescriptions on File Prior to Visit  Medication Sig  . ALPRAZolam (XANAX) 0.5 MG tablet TAKE 1 TABLET (0.5 mg) BY MOUTH every eight (8) hours for anxiety due to pain  . Ascorbic Acid (VITAMIN C PO) Take 1 tablet by mouth as needed (for immune system support).   . baclofen (LIORESAL) 10 MG tablet Take 10 mg by mouth 3 (three) times daily.  . calcium carbonate (TUMS SMOOTHIES) 750 MG chewable tablet Chew 2-3 tablets by mouth 2 (two) times daily.  . camphor-menthol (SARNA) lotion Apply 1 application topically at bedtime as needed for itching.   . Cholecalciferol (VITAMIN D) 2000 units CAPS Take 1 capsule (2,000 Units total) by mouth daily.  .  clobetasol ointment (TEMOVATE) AB-123456789 % Apply 1 application topically as needed (for eczema).   . DEXILANT 60 MG capsule TAKE 1 CAPSULE(60 MG) BY MOUTH TWICE DAILY  . diclofenac sodium (VOLTAREN) 1 % GEL Apply 1 application topically 3 (three) times daily as needed (for bursitis).   Marland Kitchen dronabinol (MARINOL) 2.5 MG capsule Take 2.5 mg by mouth daily as needed (for nausea and pain).   Marland Kitchen escitalopram (LEXAPRO) 10 MG tablet TAKE 1 TABLET BY MOUTH EVERY DAY  . hydrocortisone (ANUSOL-HC) 2.5 % rectal cream Apply 1 application topically daily as needed for hemorrhoids or itching.   . hyoscyamine (LEVSIN SL) 0.125 MG SL tablet DISSOLVE 1 TABLET(0.125 MG) UNDER THE TONGUE EVERY 4 HOURS AS NEEDED  . metroNIDAZOLE (METROGEL) 1 % gel Apply 1 application topically 2 (two) times daily as needed (for rosacea acne).   . Phenylephrine-DM-GG-APAP (MUCINEX SINUS-MAX) 5-10-200-325 MG CAPS Take 2 tablets by mouth 2 (two) times daily as needed (for migraine).   . Tapentadol HCl (NUCYNTA) 100 MG TABS Take 100 mg by mouth every 8 (eight) hours.   Marland Kitchen thyroid (ARMOUR) 60 MG tablet Take 1 tablet (60 mg total) by mouth daily before breakfast. Two days a week take extra tablet daily  . topiramate (TOPAMAX) 100 MG tablet TAKE 1 TABLET BY MOUTH EVERY DAY   No current facility-administered medications on file prior to visit.     Review of Systems  Per HPI unless specifically indicated in ROS section     Objective:    BP 110/60   Pulse 76   Temp 98 F (36.7 C) (Oral)   Wt 209 lb 8 oz (95 kg)   LMP 10/04/2014   BMI 32.81 kg/m   Wt Readings from Last 3 Encounters:  11/18/15 209 lb 8 oz (95 kg)  10/10/15 211 lb 12 oz (96 kg)  10/10/15 211 lb 12 oz (96 kg)    Physical Exam  Constitutional: She is oriented to person, place, and time. She appears well-developed and well-nourished. No distress.  HENT:  Mouth/Throat: Oropharynx is clear and moist. No oropharyngeal exudate.  Eyes: Conjunctivae are normal. Pupils are equal,  round, and reactive to light.  Neck: Normal range of motion. Neck supple.  Cardiovascular: Normal rate, regular rhythm and normal heart sounds.   No murmur heard. Pulmonary/Chest: Effort normal and breath sounds normal. No respiratory distress. She has no wheezes. She has no rales.  Musculoskeletal: She exhibits no edema.  Neurological: She is alert and oriented to person, place, and time. No cranial nerve deficit.  CN 2-12 intact FTN intact EOMi  Skin: Skin is warm and dry. Rash noted.  Pruritic urticaria bilateral hands  Psychiatric: She has a normal mood and affect.  Nursing note and vitals reviewed.  Results for orders placed or performed in visit on 11/14/15  TSH  Result Value Ref Range   TSH 3.42 0.35 - 4.50 uIU/mL  T4, free  Result Value Ref Range   Free T4 0.40 (L) 0.60 - 1.60 ng/dL  VITAMIN D 25 Hydroxy (Vit-D Deficiency, Fractures)  Result Value Ref Range   VITD 31.13 30.00 - 100.00 ng/mL      Assessment & Plan:   Problem List Items Addressed This Visit    Esophageal dysphagia    Has established with Dr Benson Norway s/p biopsy which per pt report was negative for eosinophilic esophagitis. Pending esophageal manometry. Will request records today.       Hives    Presumed from recent implanted device. Zyrtec overly sedating. Discussed starting xyzal daily.       Hypothyroidism    Levels normalizing on armour thyroid 60mg  daily with extra 60mg  twice weekly. Pt has f/u already scheduled with Dr Gabriel Carina for next month.      MDD (major depressive disorder), recurrent episode, severe ([facility])    Stable period. She has established with Caroline Sauger one of our counselors. Declines med changes at this time. Will reassess next visit.       Migraines - Primary    Current active migraine - treat with toradol/phenergan IM. Pt asks about oxygen therapy for migraines - will defer for now.  Continue topamax 100mg  daily prophylaxis.       Vitamin D deficiency    Supplementing with 2000  IU daily, levels now in low normal range.          Follow up plan: Return in about 3 months (around 02/18/2016) for follow up visit.  Ria Bush, MD

## 2015-11-18 NOTE — Assessment & Plan Note (Signed)
Supplementing with 2000 IU daily, levels now in low normal range.

## 2015-11-18 NOTE — Addendum Note (Signed)
Addended by: Royann Shivers A on: 11/18/2015 03:22 PM   Modules accepted: Orders

## 2015-11-18 NOTE — Assessment & Plan Note (Signed)
Has established with Dr Benson Norway s/p biopsy which per pt report was negative for eosinophilic esophagitis. Pending esophageal manometry. Will request records today.

## 2015-11-22 ENCOUNTER — Ambulatory Visit (INDEPENDENT_AMBULATORY_CARE_PROVIDER_SITE_OTHER): Payer: No Typology Code available for payment source | Admitting: Psychology

## 2015-11-22 DIAGNOSIS — F332 Major depressive disorder, recurrent severe without psychotic features: Secondary | ICD-10-CM

## 2015-12-01 ENCOUNTER — Ambulatory Visit (INDEPENDENT_AMBULATORY_CARE_PROVIDER_SITE_OTHER): Payer: No Typology Code available for payment source | Admitting: Psychology

## 2015-12-01 DIAGNOSIS — F332 Major depressive disorder, recurrent severe without psychotic features: Secondary | ICD-10-CM | POA: Diagnosis not present

## 2015-12-02 ENCOUNTER — Encounter: Payer: Self-pay | Admitting: Family Medicine

## 2015-12-08 ENCOUNTER — Ambulatory Visit (INDEPENDENT_AMBULATORY_CARE_PROVIDER_SITE_OTHER): Payer: No Typology Code available for payment source | Admitting: Psychology

## 2015-12-08 DIAGNOSIS — F332 Major depressive disorder, recurrent severe without psychotic features: Secondary | ICD-10-CM

## 2015-12-12 DIAGNOSIS — M961 Postlaminectomy syndrome, not elsewhere classified: Secondary | ICD-10-CM | POA: Diagnosis not present

## 2015-12-12 DIAGNOSIS — M62831 Muscle spasm of calf: Secondary | ICD-10-CM | POA: Diagnosis not present

## 2015-12-12 DIAGNOSIS — G894 Chronic pain syndrome: Secondary | ICD-10-CM | POA: Diagnosis not present

## 2015-12-12 DIAGNOSIS — Z79891 Long term (current) use of opiate analgesic: Secondary | ICD-10-CM | POA: Diagnosis not present

## 2015-12-15 ENCOUNTER — Ambulatory Visit (INDEPENDENT_AMBULATORY_CARE_PROVIDER_SITE_OTHER): Payer: No Typology Code available for payment source | Admitting: Psychology

## 2015-12-15 DIAGNOSIS — F332 Major depressive disorder, recurrent severe without psychotic features: Secondary | ICD-10-CM | POA: Diagnosis not present

## 2015-12-21 ENCOUNTER — Telehealth: Payer: Self-pay | Admitting: Family Medicine

## 2015-12-21 NOTE — Telephone Encounter (Signed)
[place]  Patient Name: Karen Aguilar  DOB: 1965-10-25    Initial Comment Caller says, having severe lower Rt side back and flank, pains, she is post menopausal, and had some spotting recently. She thinks it may be a kidney stone.    Nurse Assessment  Nurse: Wayne Sever, RN, Tillie Rung Date/Time (Eastern Time): 12/21/2015 9:51:15 AM  Confirm and document reason for call. If symptomatic, describe symptoms. ---Caller states she is having pain on the right side of her abdomen. She states the pain is pretty bad. She states she typically has lumbar pain, but this is different. She states Monday she had trouble urinating and drank lots of Cranberry Juice, but then got better. She states she had vaginal spotting a few days ago and she is post menopausal.  Does the patient have any new or worsening symptoms? ---Yes  Will a triage be completed? ---Yes  Related visit to physician within the last 2 weeks? ---No  Does the PT have any chronic conditions? (i.e. diabetes, asthma, etc.) ---Yes  List chronic conditions. ---Chronic Back Pain and lots of surgeries for back.  Is the patient pregnant or possibly pregnant? (Ask all females between the ages of 27-55) ---No  Is this a behavioral health or substance abuse call? ---No     Guidelines    Guideline Title Affirmed Question Affirmed Notes  Flank Pain MODERATE pain (e.g., interferes with normal activities or awakens from sleep)    Final Disposition User   See Physician within 24 Hours [place], RN, Tillie Rung    Comments  Scheduled on 12/21 at 79p with Dr. Silvio Pate   Referrals  REFERRED TO PCP OFFICE   Disagree/Comply: Comply

## 2015-12-21 NOTE — Telephone Encounter (Signed)
Will assess at OV 

## 2015-12-21 NOTE — Telephone Encounter (Signed)
Pt has appt with Dr Silvio Pate on 12/22/15 at 4:15.

## 2015-12-22 ENCOUNTER — Encounter: Payer: Self-pay | Admitting: Internal Medicine

## 2015-12-22 ENCOUNTER — Ambulatory Visit (INDEPENDENT_AMBULATORY_CARE_PROVIDER_SITE_OTHER): Payer: Medicare Other | Admitting: Internal Medicine

## 2015-12-22 ENCOUNTER — Ambulatory Visit: Payer: Medicare Other | Admitting: Psychology

## 2015-12-22 VITALS — BP 132/86 | HR 79 | Temp 98.0°F | Wt 207.0 lb

## 2015-12-22 DIAGNOSIS — R319 Hematuria, unspecified: Secondary | ICD-10-CM | POA: Diagnosis not present

## 2015-12-22 LAB — POC URINALSYSI DIPSTICK (AUTOMATED)
Bilirubin, UA: NEGATIVE
Glucose, UA: NEGATIVE
Ketones, UA: NEGATIVE
Nitrite, UA: NEGATIVE
PH UA: 5.5
PROTEIN UA: NEGATIVE
RBC UA: NEGATIVE
Spec Grav, UA: >=1.03
UROBILINOGEN UA: 0.2

## 2015-12-22 MED ORDER — CEPHALEXIN 500 MG PO CAPS
500.0000 mg | ORAL_CAPSULE | Freq: Three times a day (TID) | ORAL | 1 refills | Status: DC
Start: 2015-12-22 — End: 2016-01-23

## 2015-12-22 NOTE — Assessment & Plan Note (Signed)
Some weeks ago---intermittent Past kidney stone but I don't see it in past imaging Did have fever or more dysuria 3 days ago--now better Increased back pain also better Diagnostic considerations would be stone or pyelo---both unlikely since she is better Could be cystitis--?some 2 weeks ago with blood but no dysuria?? Might need to consider stone protocol CT if worsened symptoms  If she has ongoing bleeding--may need to consider uterine etiology (though that doesn't go with the rest of the symptoms). would then do pelvic ultrasound  Will send culture and treat for 3 days with keflex

## 2015-12-22 NOTE — Progress Notes (Signed)
Subjective:    Patient ID: Karen Aguilar, female    DOB: [birth year], 50 y.o.   MRN: [id number]  HPI Here due to urinary symptoms Started with dysuria 3 days ago  Just "put it together" a few days ago Also had some spotting of blood a while back--on her pads (after urinating) Also had some back pain--much more than her usual (9/10)--2 days ago Thought she was having UTI and took cystex (which usually helps) along with cranberry juice Felt better after that --this was 3 days ago (had stopped the meds) Fever to 99.9 2 nights ago--feverish and achy Tried ibuprofen, cystex, cranberry --then fever broke and she could go to sleep  Next day, still sore in right back Feels better today--still with urinary frequency. No dysuria now No blood seen since a couple of weeks ago  Has had kidney stones in past-- a couple of years ago  Current Outpatient Prescriptions on File Prior to Visit  Medication Sig Dispense Refill  . ALPRAZolam (XANAX) 0.5 MG tablet TAKE 1 TABLET (0.5 mg) BY MOUTH every eight (8) hours for anxiety due to pain    . Ascorbic Acid (VITAMIN C PO) Take 1 tablet by mouth as needed (for immune system support).     . baclofen (LIORESAL) 10 MG tablet Take 10 mg by mouth 3 (three) times daily.    . calcium carbonate (TUMS SMOOTHIES) 750 MG chewable tablet Chew 2-3 tablets by mouth 2 (two) times daily.    . camphor-menthol (SARNA) lotion Apply 1 application topically at bedtime as needed for itching.     . Cholecalciferol (VITAMIN D) 2000 units CAPS Take 1 capsule (2,000 Units total) by mouth daily. 30 capsule   . clobetasol ointment (TEMOVATE) AB-123456789 % Apply 1 application topically as needed (for eczema).     . DEXILANT 60 MG capsule TAKE 1 CAPSULE(60 MG) BY MOUTH TWICE DAILY 60 capsule 3  . diclofenac sodium (VOLTAREN) 1 % GEL Apply 1 application topically 3 (three) times daily as needed (for bursitis).     Marland Kitchen dronabinol (MARINOL) 2.5 MG capsule Take 2.5 mg by mouth daily as needed (for  nausea and pain).   0  . escitalopram (LEXAPRO) 10 MG tablet TAKE 1 TABLET BY MOUTH EVERY DAY 30 tablet 6  . hydrocortisone (ANUSOL-HC) 2.5 % rectal cream Apply 1 application topically daily as needed for hemorrhoids or itching.     . hyoscyamine (LEVSIN SL) 0.125 MG SL tablet DISSOLVE 1 TABLET(0.125 MG) UNDER THE TONGUE EVERY 4 HOURS AS NEEDED 30 tablet 0  . metroNIDAZOLE (METROGEL) 1 % gel Apply 1 application topically 2 (two) times daily as needed (for rosacea acne).     . Phenylephrine-DM-GG-APAP (MUCINEX SINUS-MAX) 5-10-200-325 MG CAPS Take 2 tablets by mouth 2 (two) times daily as needed (for migraine).     . Tapentadol HCl (NUCYNTA) 100 MG TABS Take 100 mg by mouth every 8 (eight) hours.     Marland Kitchen thyroid (ARMOUR) 60 MG tablet Take 1 tablet (60 mg total) by mouth daily before breakfast. Two days a week take extra tablet daily    . topiramate (TOPAMAX) 100 MG tablet TAKE 1 TABLET BY MOUTH EVERY DAY 90 tablet 3   Current Facility-Administered Medications on File Prior to Visit  Medication Dose Route Frequency Provider Last Rate Last Dose  . promethazine (PHENERGAN) tablet 25 mg  25 mg Oral Once Ria Bush, MD        Allergies  Allergen Reactions  . Erythromycin Nausea And  Vomiting  . Fluconazole Swelling    Lips - swelling, bleeding, blisters, cracking, peeling  . Thimerosal     Local reaction-redness,swelling; veins popping  . Augmentin [Amoxicillin-Pot Clavulanate] Swelling    Lip swelling and irritation.   . Gabapentin Other (See Comments)    Short term memory loss  . Imitrex [Sumatriptan] Other (See Comments)    Rebound migraine   . Influenza Vaccines Hives and Swelling    Redness, heat, and extreme swelling at site  . Levothyroxine Hives  . Lyrica [Pregabalin] Other (See Comments)    Short term memory loss  . Tomato Other (See Comments)    Severe acid reflux with all acid foods  . Adhesive [Tape] Rash    blisters  . Azithromycin Rash  . Macrolides And Ketolides Rash    . Nickel Swelling and Rash  . Sulfa Antibiotics Rash    Past Medical History:  Diagnosis Date  . Abnormal MRI scan, bone 2012   abnormal marrow signal humeral head, glenoid and scapula - diffuse replacement of fatty marrow - likely benign, no need for further investigation ([facility])  . Agnosia for temperature    Right Leg  . Anemia    etiology unknown-iron infusion in past,last 9/12  . Anxiety   . Anxiety and depression   . 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 Arthritis   . Chronic pain    pain contract with Dr. Eartha Inch Pain  . DDD (degenerative disc disease), cervical   . DDD (degenerative disc disease), lumbar    s/p permanent nerve damage after back surgery  . Depression   . Depression with anxiety   . Diverticulosis   . Eczema   . Family history of adverse reaction to anesthesia    Mother - nausea  . Fibromyalgia   . Foot drop    right - numbness, tingling - twisting  . Gallstones   . GERD (gastroesophageal reflux disease)    rare zantac PRN N/V at times with this  . GERD (gastroesophageal reflux disease)   . Headache(784.0)    migraines  . Hemorrhoid   . Hemorrhoids   . History of kidney stones   . Hyperlipidemia   . Hypothyroidism 1990s   hashimoto's thyroiditis  . IBS (irritable bowel syndrome)   . IBS (irritable bowel syndrome)   . Incontinence of urine    s/p sling procedure-unresolved  . Iron deficiency    h/o anemia, s/p iron infusion (2012)  . Liver hemangioma     x2  . Memory change    due to medications  . Migraines    and h/o optical migraine  . Migraines   . Mixed urge and stress incontinence 2008   extensive workup including urodynamics, failed miltiple anticholinergics including myrbetriq and urogesic blue s/p PTNS, normal cystoscopy 2014, seen by Dr Matilde Sprang and Dr Erlene Quan  . Neurogenic bladder 2008   after 13 spine surgeries with periph neuropathy and chronic LBP  . Neuropathy of lower extremity 2011  . Nocturia   .  Notalgia paresthetica   . Osteoarthritis 12/2013  . PONV (postoperative nausea and vomiting)   . Pulmonary nodule 04/2012   7.31mm RLL nodule - resolved on imaging 12/2012  . Sleep apnea    mild-doesn't use cpap  . Urine frequency   . Vitamin D deficiency     Past Surgical History:  Procedure Laterality Date  . ANTERIOR CERVICAL DECOMP/DISCECTOMY FUSION     C3, 4, 5 - improved after  surgery  . BACK SURGERY     multiple-L4-s1,hardware,fusion,removal,infection s/p 5/11 procedure  . BACK SURGERY  02/16/11   "my 7th back OR; Procedure: Exploration of fusion removal of hardware L4-S1, harvesting of right iliac crest bone graft, redo posterior lateral fusion L4-5 using iliac crest bone graft BMP and master graft replacement of bilateral L4 screws with a Telfa tech 7.5 x 40 mm screw on the right and 8.5 x 40 mm screw on the left placement of a large Hemovac drain  . CERVICAL FUSION     x2  . [place]; 2001   x 2  . CHOLECYSTECTOMY  2001  . COLONOSCOPY    . ESOPHAGOGASTRODUODENOSCOPY  03/2013   early esophageal stricture dilated, o/w WNL Deatra Ina)  . ESOPHAGOGASTRODUODENOSCOPY  12/2014   WNL, s/p empiric dilation of esophagus Henrene Pastor)  . ESOPHAGOGASTRODUODENOSCOPY  10/2015   WNL, biopsies WNL as well (minimal esophagitis with reactive changes) Benson Norway)  . HEMORRHOID BANDING    . LASIK Bilateral   . LUMBAR WOUND DEBRIDEMENT  03/15/2011   Procedure: LUMBAR WOUND DEBRIDEMENT;  Surgeon: Elaina Hoops, MD;  Lumbar Wound Debridement  . MANDIBLE RECONSTRUCTION  1986  . PFT  01/28/2013   WNL  . PUBOVAGINAL SLING  2008   midurethral sling  . SHOULDER CLOSED REDUCTION Left 12/18/2010   Procedure: CLOSED MANIPULATION SHOULDER;  Surgeon: Johnn Hai; labrial debridement  . SPINAL CORD STIMULATOR INSERTION N/A 08/19/2015   Procedure: LUMBAR SPINAL CORD STIMULATOR INSERTION;  Surgeon: Erline Levine, MD;  Location: [facility];  Service: Neurosurgery;  Laterality: N/A;  LUMBAR SPINAL CORD  STIMULATOR INSERTION  . SPINE SURGERY    . TONSILLECTOMY  1972   as a child; "then they grew back"    Family History  Problem Relation Age of Onset  . Lung cancer Father 70     (smoker)  . Diabetes Brother   . Thyroid disease Mother     ?empty sella  . Irritable bowel syndrome Mother   . Dysphagia Mother   . CAD Maternal Grandfather     MI  . Heart disease Maternal Grandfather   . Heart attack Maternal Grandfather   . Lung cancer Paternal Grandfather      (smoker)  . Colon cancer Paternal Grandmother   . Thyroid disease Cousin   . Cancer Other     unsure (ovarian/uterine)  . Breast cancer Maternal Aunt   . Alzheimer's disease Maternal Grandmother   . Diabetes Daughter     Social History   Social History  . Marital status: Married    Spouse name: N/A  . Number of children: 2  . Years of education: N/A   Occupational History  . disabled    Social History Main Topics  . Smoking status: Never Smoker  . Smokeless tobacco: Never Used  . Alcohol use No  . Drug use: No  . Sexual activity: Not Currently   Other Topics Concern  . Not on file   Social History Narrative   Lives with husband and 2 daughters (one in college)   Daughter of Lurena Joiner   Occupation: was Public affairs consultant   Disability since 2011 - arachnoiditis   Edu: associate's degree   Activity: severe limitations 2/2 arachnoiditis and chronic pain, walks to end of driveway and back   Diet: good water, fruits/vegetables daily   Review of Systems Menopause over a year ago Chronic back pain--has spinal cord stimulator    Objective:   Physical  Exam  Constitutional: She appears well-nourished. No distress.  Pulmonary/Chest: Effort normal and breath sounds normal. No respiratory distress. She has no wheezes. She has no rales.  Abdominal: Soft. Bowel sounds are normal. She exhibits no distension. There is no tenderness. There is no rebound and no guarding.  Musculoskeletal: She  exhibits no edema.  No sig CVA tenderness          Assessment & Plan:

## 2015-12-22 NOTE — Patient Instructions (Signed)
Let us know if you have continued symptoms despite the antibiotic.

## 2015-12-22 NOTE — Progress Notes (Signed)
Pre visit review using our clinic review tool, if applicable. No additional management support is needed unless otherwise documented below in the visit note. 

## 2015-12-23 LAB — URINE CULTURE

## 2015-12-24 ENCOUNTER — Encounter: Payer: Self-pay | Admitting: Internal Medicine

## 2015-12-29 ENCOUNTER — Ambulatory Visit (INDEPENDENT_AMBULATORY_CARE_PROVIDER_SITE_OTHER): Payer: No Typology Code available for payment source | Admitting: Psychology

## 2015-12-29 DIAGNOSIS — F332 Major depressive disorder, recurrent severe without psychotic features: Secondary | ICD-10-CM | POA: Diagnosis not present

## 2015-12-30 DIAGNOSIS — E063 Autoimmune thyroiditis: Secondary | ICD-10-CM | POA: Diagnosis not present

## 2015-12-30 DIAGNOSIS — E038 Other specified hypothyroidism: Secondary | ICD-10-CM | POA: Diagnosis not present

## 2016-01-05 ENCOUNTER — Ambulatory Visit: Payer: No Typology Code available for payment source | Admitting: Psychology

## 2016-01-06 DIAGNOSIS — E063 Autoimmune thyroiditis: Secondary | ICD-10-CM | POA: Diagnosis not present

## 2016-01-06 DIAGNOSIS — E038 Other specified hypothyroidism: Secondary | ICD-10-CM | POA: Diagnosis not present

## 2016-01-09 ENCOUNTER — Encounter: Payer: Self-pay | Admitting: Family Medicine

## 2016-01-11 DIAGNOSIS — G894 Chronic pain syndrome: Secondary | ICD-10-CM | POA: Diagnosis not present

## 2016-01-11 DIAGNOSIS — Z79891 Long term (current) use of opiate analgesic: Secondary | ICD-10-CM | POA: Diagnosis not present

## 2016-01-11 DIAGNOSIS — M62831 Muscle spasm of calf: Secondary | ICD-10-CM | POA: Diagnosis not present

## 2016-01-11 DIAGNOSIS — M961 Postlaminectomy syndrome, not elsewhere classified: Secondary | ICD-10-CM | POA: Diagnosis not present

## 2016-01-15 NOTE — Telephone Encounter (Signed)
plz cancel 02/2016 appt with me.

## 2016-01-19 NOTE — Telephone Encounter (Signed)
nevermind

## 2016-01-23 ENCOUNTER — Ambulatory Visit (INDEPENDENT_AMBULATORY_CARE_PROVIDER_SITE_OTHER): Payer: Medicare Other | Admitting: Family Medicine

## 2016-01-23 ENCOUNTER — Encounter: Payer: Self-pay | Admitting: Family Medicine

## 2016-01-23 VITALS — BP 118/60 | HR 88 | Temp 97.9°F | Wt 208.0 lb

## 2016-01-23 DIAGNOSIS — N319 Neuromuscular dysfunction of bladder, unspecified: Secondary | ICD-10-CM

## 2016-01-23 DIAGNOSIS — N3946 Mixed incontinence: Secondary | ICD-10-CM

## 2016-01-23 DIAGNOSIS — F332 Major depressive disorder, recurrent severe without psychotic features: Secondary | ICD-10-CM | POA: Diagnosis not present

## 2016-01-23 DIAGNOSIS — L509 Urticaria, unspecified: Secondary | ICD-10-CM

## 2016-01-23 DIAGNOSIS — R102 Pelvic and perineal pain unspecified side: Secondary | ICD-10-CM | POA: Insufficient documentation

## 2016-01-23 DIAGNOSIS — G894 Chronic pain syndrome: Secondary | ICD-10-CM | POA: Diagnosis not present

## 2016-01-23 DIAGNOSIS — Z780 Asymptomatic menopausal state: Secondary | ICD-10-CM | POA: Diagnosis not present

## 2016-01-23 LAB — POC URINALSYSI DIPSTICK (AUTOMATED)
BILIRUBIN UA: 1
GLUCOSE UA: NEGATIVE
KETONES UA: NEGATIVE
Leukocytes, UA: NEGATIVE
NITRITE UA: NEGATIVE
PH UA: 5.5
Protein, UA: NEGATIVE
RBC UA: NEGATIVE
Spec Grav, UA: >=1.03
Urobilinogen, UA: 0.2

## 2016-01-23 MED ORDER — CLONAZEPAM 0.5 MG PO TABS: 0.5000 mg | ORAL_TABLET | Freq: Two times a day (BID) | ORAL | 0 refills | Status: DC | PRN

## 2016-01-23 NOTE — Assessment & Plan Note (Addendum)
Treated for UTI with 3d keflex course last month.  Describes ongoing pelvic pressure associated with urge incontinence.  Declines in office eval.  Will refer to [facility] gynecology - prior saw Dr Laurey Morale.

## 2016-01-23 NOTE — Assessment & Plan Note (Signed)
Ongoing anxiety despite current regimen of alprazolam 0.5mg  TID PRN and lexapro 10mg  daily. Pt interested in transition from alprazolam as she's worried about long term cognitive effect of benzo.  Will taper from xanax 0.5mg  TID to klonopin 0.5mg  BID PRN. See pt instructions for plan. Pt agrees with plan.  Continue f/u with Caroline Sauger our counselor.

## 2016-01-23 NOTE — Progress Notes (Signed)
Pre visit review using our clinic review tool, if applicable. No additional management support is needed unless otherwise documented below in the visit note. 

## 2016-01-23 NOTE — Assessment & Plan Note (Signed)
Describes urge incontinence. H/o mixed incontinence s/p prior uro workup including urodynamics. Will refer to gyn for further eval. Prior saw Dr Matilde Sprang and Erlene Quan. She has had normal cystoscopy 2014.

## 2016-01-23 NOTE — Assessment & Plan Note (Signed)
Known h/o nerve damage to kidneys with resultant neurogenic bladder. Not currently on medication.

## 2016-01-23 NOTE — Patient Instructions (Addendum)
We will refer you to GYN for further evaluation of pelvic pain.  Continue seeing Bambi.  Lets try to decrease xanax to mornings only x 1 week, take klonopin 0.5mg  at night time for 1 week. Then stop xanax and increase klonopin to 0.5mg  twice daily.

## 2016-01-23 NOTE — Assessment & Plan Note (Signed)
Pt attributes to electrical current of nerve stimulator unit she has in place.  Planning on exchanging battery pack to less current requirement.

## 2016-01-23 NOTE — Progress Notes (Signed)
BP 118/60   Pulse 88   Temp 97.9 F (36.6 C) (Oral)   Wt 208 lb (94.3 kg)   LMP 10/04/2014 Comment: per labs  BMI 32.58 kg/m    CC: f/u visit Subjective:    Patient ID: Karen Aguilar, female    DOB: 27-May-1965, 51 y.o.   MRN: [id number]  HPI: Karen Aguilar is a 51 y.o. female presenting on 01/23/2016 for Urinary Incontinence and Anxiety   She enjoyed recent snow day - watching snow fall down was cathartic.   Lab confirmed menopause, with amenorrhea x 1 year. Hormone replacement caused nausea and worsening pain. A few months ago had episode of pelvic pain associated with vaginal discharge and light bleeding. She did not seek care for this. Last month UTI sxs - treated with keflex course. Over the last week endorsing pelvic pressure > pain without bleeding but leading to worsening urinary urgency and incontinence. Some stress incontinence but predominant concern currently is urge incontinence. + key in door and foot on floor phenomenon. Wears poise pads daily. No dysuria, hematuria. Finds baclofen helpful.   Longstanding urinary urgency with some incontinence. Chronic bladder spasms per patient. PTNS didn't help. Baclofen helps. Urgency treatment which helped (urogesic blue) unable to be approved through [employer]. PFPT completed through urology, this was helpful. She continues pelvic floor HEP. Known nerve damage to bladder. Prior saw Dr Laurey Morale at [facility].   Increasing anxiety and stress noted over last few months, starting since 10/2015. Noticing thinning hair. Initially attributed to difficult to control hypothyroidism (followed by Dr Gabriel Carina endocrinology, on armour thyroid) but patient would like further treatment for her depression and anxiety. She has established with Bambi our counselor and has found this to be helpful. Current regimen is lexapro 10mg  daily, xanax 0.5mg  TID PRN. No SI/HI. Concern for increased SSRI dose as pt also on nucynta.   Requests we take over  anxiety medications - ok by prior prescriber Dr Hardin Negus.   Ongoing hives after electrical stimulator placement - found she's allergic to electric current. Worse with charging of battery. However she finds the electrical stimulator has been helpful for pain. Zyrtec was overly sedating. Considering changing batteries.   Relevant past medical, surgical, family and social history reviewed and updated as indicated. Interim medical history since our last visit reviewed. Allergies and medications reviewed and updated. Current Outpatient Prescriptions on File Prior to Visit  Medication Sig  . Ascorbic Acid (VITAMIN C PO) Take 1 tablet by mouth as needed (for immune system support).   . baclofen (LIORESAL) 10 MG tablet Take 10 mg by mouth 3 (three) times daily.  . calcium carbonate (TUMS SMOOTHIES) 750 MG chewable tablet Chew 2-3 tablets by mouth 2 (two) times daily.  . camphor-menthol (SARNA) lotion Apply 1 application topically at bedtime as needed for itching.   . Cholecalciferol (VITAMIN D) 2000 units CAPS Take 1 capsule (2,000 Units total) by mouth daily.  . clobetasol ointment (TEMOVATE) AB-123456789 % Apply 1 application topically as needed (for eczema).   . DEXILANT 60 MG capsule TAKE 1 CAPSULE(60 MG) BY MOUTH TWICE DAILY  . diclofenac sodium (VOLTAREN) 1 % GEL Apply 1 application topically 3 (three) times daily as needed (for bursitis).   Marland Kitchen dronabinol (MARINOL) 2.5 MG capsule Take 2.5 mg by mouth daily as needed (for nausea and pain).   Marland Kitchen escitalopram (LEXAPRO) 10 MG tablet TAKE 1 TABLET BY MOUTH EVERY DAY  . hydrocortisone (ANUSOL-HC) 2.5 % rectal cream Apply 1 application  topically daily as needed for hemorrhoids or itching.   . hyoscyamine (LEVSIN SL) 0.125 MG SL tablet DISSOLVE 1 TABLET(0.125 MG) UNDER THE TONGUE EVERY 4 HOURS AS NEEDED  . metroNIDAZOLE (METROGEL) 1 % gel Apply 1 application topically 2 (two) times daily as needed (for rosacea acne).   . Phenylephrine-DM-GG-APAP (MUCINEX SINUS-MAX)  5-10-200-325 MG CAPS Take 2 tablets by mouth 2 (two) times daily as needed (for migraine).   . Tapentadol HCl (NUCYNTA) 100 MG TABS Take 100 mg by mouth every 8 (eight) hours.   Marland Kitchen thyroid (ARMOUR) 60 MG tablet Take 60 mg by mouth daily before breakfast. Three days a week take extra tablet daily  . topiramate (TOPAMAX) 100 MG tablet TAKE 1 TABLET BY MOUTH EVERY DAY   Current Facility-Administered Medications on File Prior to Visit  Medication  . promethazine (PHENERGAN) tablet 25 mg   Past Medical History:  Diagnosis Date  . Abnormal MRI scan, bone 2012   abnormal marrow signal humeral head, glenoid and scapula - diffuse replacement of fatty marrow - likely benign, no need for further investigation ([facility])  . Agnosia for temperature    Right Leg  . Anemia    etiology unknown-iron infusion in past,last 9/12  . Anxiety   . Anxiety and depression   . 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 Arthritis   . Chronic pain    pain contract with Dr. Eartha Inch Pain  . DDD (degenerative disc disease), cervical   . DDD (degenerative disc disease), lumbar    s/p permanent nerve damage after back surgery  . Depression   . Depression with anxiety   . Diverticulosis   . Eczema   . Family history of adverse reaction to anesthesia    Mother - nausea  . Fibromyalgia   . Foot drop    right - numbness, tingling - twisting  . Gallstones   . GERD (gastroesophageal reflux disease)    rare zantac PRN N/V at times with this  . GERD (gastroesophageal reflux disease)   . Headache(784.0)    migraines  . Hemorrhoid   . Hemorrhoids   . History of kidney stones   . Hyperlipidemia   . Hypothyroidism 1990s   hashimoto's thyroiditis  . IBS (irritable bowel syndrome)   . IBS (irritable bowel syndrome)   . Incontinence of urine    s/p sling procedure-unresolved  . Iron deficiency    h/o anemia, s/p iron infusion (2012)  . Liver hemangioma     x2  . Memory change    due to medications  .  Migraines    and h/o optical migraine  . Migraines   . Mixed urge and stress incontinence 2008   extensive workup including urodynamics, failed miltiple anticholinergics including myrbetriq and urogesic blue s/p PTNS, normal cystoscopy 2014, seen by Dr Matilde Sprang and Dr Erlene Quan  . Neurogenic bladder 2008   after 13 spine surgeries with periph neuropathy and chronic LBP  . Neuropathy of lower extremity 2011  . Nocturia   . Notalgia paresthetica   . Osteoarthritis 12/2013  . PONV (postoperative nausea and vomiting)   . Pulmonary nodule 04/2012   7.28mm RLL nodule - resolved on imaging 12/2012  . Sleep apnea    mild-doesn't use cpap  . Urine frequency   . Vitamin D deficiency     Past Surgical History:  Procedure Laterality Date  . ANTERIOR CERVICAL DECOMP/DISCECTOMY FUSION     C3, 4, 5 - improved after surgery  .  BACK SURGERY     multiple-L4-s1,hardware,fusion,removal,infection s/p 5/11 procedure  . BACK SURGERY  02/16/11   "my 7th back OR; Procedure: Exploration of fusion removal of hardware L4-S1, harvesting of right iliac crest bone graft, redo posterior lateral fusion L4-5 using iliac crest bone graft BMP and master graft replacement of bilateral L4 screws with a Telfa tech 7.5 x 40 mm screw on the right and 8.5 x 40 mm screw on the left placement of a large Hemovac drain  . CERVICAL FUSION     x2  . [place]; 2001   x 2  . CHOLECYSTECTOMY  2001  . COLONOSCOPY    . ESOPHAGOGASTRODUODENOSCOPY  03/2013   early esophageal stricture dilated, o/w WNL Deatra Ina)  . ESOPHAGOGASTRODUODENOSCOPY  12/2014   WNL, s/p empiric dilation of esophagus Henrene Pastor)  . ESOPHAGOGASTRODUODENOSCOPY  10/2015   WNL, biopsies WNL as well (minimal esophagitis with reactive changes) Benson Norway)  . HEMORRHOID BANDING    . LASIK Bilateral   . LUMBAR WOUND DEBRIDEMENT  03/15/2011   Procedure: LUMBAR WOUND DEBRIDEMENT;  Surgeon: Elaina Hoops, MD;  Lumbar Wound Debridement  . MANDIBLE RECONSTRUCTION  1986    . PFT  01/28/2013   WNL  . PUBOVAGINAL SLING  2008   midurethral sling  . SHOULDER CLOSED REDUCTION Left 12/18/2010   Procedure: CLOSED MANIPULATION SHOULDER;  Surgeon: Johnn Hai; labrial debridement  . SPINAL CORD STIMULATOR INSERTION N/A 08/19/2015   Procedure: LUMBAR SPINAL CORD STIMULATOR INSERTION;  Surgeon: Erline Levine, MD;  Location: [place] [facility];  Service: Neurosurgery;  Laterality: N/A;  LUMBAR SPINAL CORD STIMULATOR INSERTION  . SPINE SURGERY    . TONSILLECTOMY  1972   as a child; "then they grew back"    Family History  Problem Relation Age of Onset  . Lung cancer Father 25     (smoker)  . Diabetes Brother   . Thyroid disease Mother     ?empty sella  . Irritable bowel syndrome Mother   . Dysphagia Mother   . CAD Maternal Grandfather     MI  . Heart disease Maternal Grandfather   . Heart attack Maternal Grandfather   . Lung cancer Paternal Grandfather      (smoker)  . Colon cancer Paternal Grandmother   . Thyroid disease Cousin   . Cancer Other     unsure (ovarian/uterine)  . Breast cancer Maternal Aunt   . Alzheimer's disease Maternal Grandmother   . Diabetes Daughter     Social History  Substance Use Topics  . Smoking status: Never Smoker  . Smokeless tobacco: Never Used  . Alcohol use No    Review of Systems Per HPI unless specifically indicated in ROS section     Objective:    BP 118/60   Pulse 88   Temp 97.9 F (36.6 C) (Oral)   Wt 208 lb (94.3 kg)   LMP 10/04/2014 Comment: per labs  BMI 32.58 kg/m   Wt Readings from Last 3 Encounters:  01/23/16 208 lb (94.3 kg)  12/22/15 207 lb (93.9 kg)  11/18/15 209 lb 8 oz (95 kg)    Physical Exam  Constitutional: She appears well-developed and well-nourished. No distress.  Skin: Skin is warm and dry. No rash noted.  Psychiatric: She has a normal mood and affect. Her behavior is normal. Thought content normal.  Nursing note and vitals reviewed.  Results for orders placed or performed in visit  on 01/23/16  POCT Urinalysis Dipstick (Automated)  Result Value Ref  Range   Color, UA Yellow    Clarity, UA Clear    Glucose, UA Negative    Bilirubin, UA 1    Ketones, UA Negative    Spec Grav, UA >=1.030    Blood, UA Negative    pH, UA 5.5    Protein, UA Negative    Urobilinogen, UA 0.2    Nitrite, UA Negative    Leukocytes, UA Negative Negative      Assessment & Plan:  Over 25 minutes were spent face-to-face with the patient during this encounter and >50% of that time was spent on counseling and coordination of care  Problem List Items Addressed This Visit    Chronic pain syndrome   MDD (major depressive disorder), recurrent episode, severe (HCC)    Ongoing anxiety despite current regimen of alprazolam 0.5mg  TID PRN and lexapro 10mg  daily. Pt interested in transition from alprazolam as she's worried about long term cognitive effect of benzo.  Will taper from xanax 0.5mg  TID to klonopin 0.5mg  BID PRN. See pt instructions for plan. Pt agrees with plan.  Continue f/u with Caroline Sauger our counselor.       Relevant Medications   ALPRAZolam (XANAX) 0.5 MG tablet   Mixed urge and stress incontinence    Describes urge incontinence. H/o mixed incontinence s/p prior uro workup including urodynamics. Will refer to gyn for further eval. Prior saw Dr Matilde Sprang and Erlene Quan. She has had normal cystoscopy 2014.       Relevant Orders   Ambulatory referral to Gynecology   Neurogenic bladder    Known h/o nerve damage to kidneys with resultant neurogenic bladder. Not currently on medication.       Relevant Orders   Ambulatory referral to Gynecology   Pelvic pressure in female - Primary    Treated for UTI with 3d keflex course last month.  Describes ongoing pelvic pressure associated with urge incontinence.  Declines in office eval.  Will refer to [facility]cology - prior saw Dr Laurey Morale.       Relevant Orders   POCT Urinalysis Dipstick (Automated) (Completed)   Ambulatory  referral to Gynecology   Post-menopausal    Hormonally confirmed per patient, LMP >1 yr ago.       Relevant Orders   Ambulatory referral to Gynecology   Urticaria    Pt attributes to electrical current of nerve stimulator unit she has in place.  Planning on exchanging battery pack to less current requirement.           Follow up plan: Return if symptoms worsen or fail to improve.  Ria Bush, MD

## 2016-01-23 NOTE — Assessment & Plan Note (Signed)
Hormonally confirmed per patient, LMP >1 yr ago.

## 2016-01-26 ENCOUNTER — Other Ambulatory Visit: Payer: Self-pay | Admitting: Family Medicine

## 2016-01-30 ENCOUNTER — Ambulatory Visit (INDEPENDENT_AMBULATORY_CARE_PROVIDER_SITE_OTHER): Payer: No Typology Code available for payment source | Admitting: Psychology

## 2016-01-30 DIAGNOSIS — F332 Major depressive disorder, recurrent severe without psychotic features: Secondary | ICD-10-CM | POA: Diagnosis not present

## 2016-02-08 DIAGNOSIS — M62831 Muscle spasm of calf: Secondary | ICD-10-CM | POA: Diagnosis not present

## 2016-02-08 DIAGNOSIS — G894 Chronic pain syndrome: Secondary | ICD-10-CM | POA: Diagnosis not present

## 2016-02-08 DIAGNOSIS — Z79891 Long term (current) use of opiate analgesic: Secondary | ICD-10-CM | POA: Diagnosis not present

## 2016-02-08 DIAGNOSIS — M961 Postlaminectomy syndrome, not elsewhere classified: Secondary | ICD-10-CM | POA: Diagnosis not present

## 2016-02-14 DIAGNOSIS — N950 Postmenopausal bleeding: Secondary | ICD-10-CM | POA: Diagnosis not present

## 2016-02-14 DIAGNOSIS — R102 Pelvic and perineal pain: Secondary | ICD-10-CM | POA: Diagnosis not present

## 2016-02-17 ENCOUNTER — Ambulatory Visit (INDEPENDENT_AMBULATORY_CARE_PROVIDER_SITE_OTHER): Payer: Medicare Other | Admitting: Family Medicine

## 2016-02-17 ENCOUNTER — Encounter: Payer: Self-pay | Admitting: Family Medicine

## 2016-02-17 VITALS — BP 134/80 | HR 77 | Temp 98.1°F | Wt 205.0 lb

## 2016-02-17 DIAGNOSIS — F331 Major depressive disorder, recurrent, moderate: Secondary | ICD-10-CM | POA: Diagnosis not present

## 2016-02-17 DIAGNOSIS — E039 Hypothyroidism, unspecified: Secondary | ICD-10-CM | POA: Diagnosis not present

## 2016-02-17 DIAGNOSIS — G894 Chronic pain syndrome: Secondary | ICD-10-CM

## 2016-02-17 DIAGNOSIS — N3946 Mixed incontinence: Secondary | ICD-10-CM

## 2016-02-17 DIAGNOSIS — R102 Pelvic and perineal pain: Secondary | ICD-10-CM

## 2016-02-17 DIAGNOSIS — L509 Urticaria, unspecified: Secondary | ICD-10-CM

## 2016-02-17 DIAGNOSIS — M25511 Pain in right shoulder: Secondary | ICD-10-CM

## 2016-02-17 DIAGNOSIS — F411 Generalized anxiety disorder: Secondary | ICD-10-CM

## 2016-02-17 MED ORDER — CLONAZEPAM 0.5 MG PO TABS: 0.5000 mg | ORAL_TABLET | Freq: Two times a day (BID) | ORAL | 0 refills | Status: DC | PRN

## 2016-02-17 NOTE — Progress Notes (Signed)
Pre visit review using our clinic review tool, if applicable. No additional management support is needed unless otherwise documented below in the visit note. 

## 2016-02-17 NOTE — Patient Instructions (Addendum)
Good to see you today Return as needed or in 3 months for follow up visit Hopefully stress continues to improve and klonopin is effective for anxiety management. Continue this, lexapro, and counseling.

## 2016-02-17 NOTE — Progress Notes (Addendum)
BP 134/80 (BP Location: Left Arm, Patient Position: Sitting, Cuff Size: Large)   Pulse 77   Temp 98.1 F (36.7 C) (Oral)   Wt 205 lb (93 kg)   LMP 10/04/2014 Comment: per labs  SpO2 96%   BMI 32.11 kg/m    CC: 3 mo f/u visit Subjective:    Patient ID: Karen Aguilar, female    DOB: 16-Jan-1965, 51 y.o.   MRN: [id number]  HPI: Karen Aguilar is a 51 y.o. female presenting on 02/17/2016 for 3 month follow-up   See prior note for details. For anxiety - last visit for anxiety we transitioned from xanax to klonopin 0.5mg  bid. She continues lexapro 10mg  daily. She has established with Caroline Sauger for counseling. We started prescribing anxiety regimen - previously followed by Dr Hardin Negus pain specialist. She feels new regimen of klonopin is doing well, however unsure how well as she has had a very stressful last few weeks.   Tough week. Had spinal cord stimulator adjusted last week - increased back pain and muscle spasm.   Last visit we referred to Gyn Dr Star Age for pelvic pain. Pt states physical exam was reassuringly normal. ?IC - pending bladder ultrasound then to discuss elmiron medication.   Found to be allergic to current when battery is charged - hives. She runs stimulator at high frequency mode. Finds that when charging battery at lower current she tolerates this better without reaction. Hopeful to continue this without reaction to avoid sooner battery change.   Feels thyroid is doing better. Weight loss noted although no diet changes.   Relevant past medical, surgical, family and social history reviewed and updated as indicated. Interim medical history since our last visit reviewed. Allergies and medications reviewed and updated. Current Outpatient Prescriptions on File Prior to Visit  Medication Sig  . Ascorbic Acid (VITAMIN C PO) Take 1 tablet by mouth as needed (for immune system support).   . baclofen (LIORESAL) 10 MG tablet Take 10 mg by mouth 3 (three) times daily.  .  calcium carbonate (TUMS SMOOTHIES) 750 MG chewable tablet Chew 2-3 tablets by mouth 2 (two) times daily.  . camphor-menthol (SARNA) lotion Apply 1 application topically at bedtime as needed for itching.   . Cholecalciferol (VITAMIN D) 2000 units CAPS Take 1 capsule (2,000 Units total) by mouth daily.  . clobetasol ointment (TEMOVATE) [id number] % Apply 1 application topically as needed (for eczema).   . DEXILANT 60 MG capsule TAKE 1 CAPSULE(60 MG) BY MOUTH TWICE DAILY  . diclofenac sodium (VOLTAREN) 1 % GEL Apply 1 application topically 3 (three) times daily as needed (for bursitis).   Marland Kitchen dronabinol (MARINOL) 2.5 MG capsule Take 2.5 mg by mouth daily as needed (for nausea and pain).   Marland Kitchen escitalopram (LEXAPRO) 10 MG tablet TAKE 1 TABLET BY MOUTH EVERY DAY  . hydrocortisone (ANUSOL-HC) 2.5 % rectal cream Apply 1 application topically daily as needed for hemorrhoids or itching.   . hyoscyamine (LEVSIN SL) 0.125 MG SL tablet DISSOLVE 1 TABLET(0.125 MG) UNDER THE TONGUE EVERY 4 HOURS AS NEEDED  . metroNIDAZOLE (METROGEL) 1 % gel Apply 1 application topically 2 (two) times daily as needed (for rosacea acne).   . Phenylephrine-DM-GG-APAP (MUCINEX SINUS-MAX) 5-10-200-325 MG CAPS Take 2 tablets by mouth 2 (two) times daily as needed (for migraine).   . Tapentadol HCl (NUCYNTA) 100 MG TABS Take 100 mg by mouth every 8 (eight) hours.   Marland Kitchen thyroid (ARMOUR) 60 MG tablet Take 60 mg by mouth daily  before breakfast. Three days a week take extra tablet daily  . topiramate (TOPAMAX) 100 MG tablet TAKE 1 TABLET BY MOUTH EVERY DAY   Current Facility-Administered Medications on File Prior to Visit  Medication  . promethazine (PHENERGAN) tablet 25 mg    Review of Systems Per HPI unless specifically indicated in ROS section     Objective:    BP 134/80 (BP Location: Left Arm, Patient Position: Sitting, Cuff Size: Large)   Pulse 77   Temp 98.1 F (36.7 C) (Oral)   Wt 205 lb (93 kg)   LMP 10/04/2014 Comment: per labs   SpO2 96%   BMI 32.11 kg/m   Wt Readings from Last 3 Encounters:  02/17/16 205 lb (93 kg)  01/23/16 208 lb (94.3 kg)  12/22/15 207 lb (93.9 kg)    Physical Exam  Constitutional: She appears well-developed and well-nourished. No distress.  HENT:  Mouth/Throat: Oropharynx is clear and moist. No oropharyngeal exudate.  Cardiovascular: Normal rate, regular rhythm, normal heart sounds and intact distal pulses.   No murmur heard. Pulmonary/Chest: Effort normal and breath sounds normal. No respiratory distress. She has no wheezes. She has no rales.  Musculoskeletal: She exhibits no edema.  No midline cervical spine tenderness Tender to palpation at R trapezius mm  Neurological:  5/5 strength BUE Grip strength intact Sensation intact  Skin: Skin is warm and dry. No rash noted.  Psychiatric: She has a normal mood and affect.  Nursing note and vitals reviewed.     Assessment & Plan:  Over 25 minutes were spent face-to-face with the patient during this encounter and >50% of that time was spent on counseling and coordination of care  Problem List Items Addressed This Visit    Chronic pain syndrome    Doing better with spinal stimulator however had rough week with recent adjustments to stimulator. Working with tech.  Pain managed by Dr Hardin Negus of [facility]. Chronic pain stems from DDD s/p several surgeries and resulting arachnoiditis      GAD (generalized anxiety disorder)    Stable period on lexapro. Doing well with transition from xanax to klonopin. Has f/u with counselor Caroline Sauger.       Hypothyroidism    Stable period. Followed by endo Dr Gabriel Carina. Has f/u appt next month.       MDD (major depressive disorder), recurrent episode, moderate ([place]) - Primary    Stable period on lexapro. Doing well with transition from xanax to klonopin. Has f/u with counselor Caroline Sauger.       Mixed urge and stress incontinence    On baclofen TID scheduled. We referred to GYN this year.        Pelvic pressure in female    ?interstitial cystitis pending bladder US then possible trial elmiron.      Right shoulder pain    Anticipate muscular pain given tenderness to palpation. rec heating pad, massage, continue baclofen. Update if not improved. Not consistent with cervical etiology today.       Urticaria    Spinal stimulator set at high frequency, has found that when she charges stimulator at low frequency she doesn't have urticarial reaction.           Follow up plan: Return in about 3 months (around 05/16/2016) for follow up visit.  Ria Bush, MD

## 2016-02-18 DIAGNOSIS — M25511 Pain in right shoulder: Secondary | ICD-10-CM | POA: Insufficient documentation

## 2016-02-18 DIAGNOSIS — F411 Generalized anxiety disorder: Secondary | ICD-10-CM | POA: Insufficient documentation

## 2016-02-18 NOTE — Assessment & Plan Note (Signed)
?  interstitial cystitis pending bladder US then possible trial elmiron.

## 2016-02-18 NOTE — Assessment & Plan Note (Signed)
On baclofen TID scheduled. We referred to GYN this year.

## 2016-02-18 NOTE — Assessment & Plan Note (Signed)
Stable period. Followed by endo Dr Gabriel Carina. Has f/u appt next month.

## 2016-02-18 NOTE — Assessment & Plan Note (Signed)
Stable period on lexapro. Doing well with transition from xanax to klonopin. Has f/u with counselor Caroline Sauger.

## 2016-02-18 NOTE — Assessment & Plan Note (Signed)
Anticipate muscular pain given tenderness to palpation. rec heating pad, massage, continue baclofen. Update if not improved. Not consistent with cervical etiology today.

## 2016-02-18 NOTE — Assessment & Plan Note (Signed)
Doing better with spinal stimulator however had rough week with recent adjustments to stimulator. Working with tech.  Pain managed by Dr Hardin Negus of [facility]. Chronic pain stems from DDD s/p several surgeries and resulting arachnoiditis

## 2016-02-18 NOTE — Assessment & Plan Note (Signed)
Spinal stimulator set at high frequency, has found that when she charges stimulator at low frequency she doesn't have urticarial reaction.

## 2016-02-27 ENCOUNTER — Other Ambulatory Visit: Payer: Self-pay | Admitting: Internal Medicine

## 2016-02-27 ENCOUNTER — Other Ambulatory Visit (INDEPENDENT_AMBULATORY_CARE_PROVIDER_SITE_OTHER): Payer: Self-pay | Admitting: Orthopaedic Surgery

## 2016-02-29 ENCOUNTER — Ambulatory Visit (INDEPENDENT_AMBULATORY_CARE_PROVIDER_SITE_OTHER): Payer: No Typology Code available for payment source | Admitting: Psychology

## 2016-02-29 DIAGNOSIS — F332 Major depressive disorder, recurrent severe without psychotic features: Secondary | ICD-10-CM | POA: Diagnosis not present

## 2016-03-02 ENCOUNTER — Other Ambulatory Visit: Payer: Self-pay | Admitting: Obstetrics and Gynecology

## 2016-03-02 DIAGNOSIS — N950 Postmenopausal bleeding: Secondary | ICD-10-CM

## 2016-03-02 DIAGNOSIS — R102 Pelvic and perineal pain: Secondary | ICD-10-CM

## 2016-03-08 DIAGNOSIS — M5412 Radiculopathy, cervical region: Secondary | ICD-10-CM | POA: Diagnosis not present

## 2016-03-08 DIAGNOSIS — Z79891 Long term (current) use of opiate analgesic: Secondary | ICD-10-CM | POA: Diagnosis not present

## 2016-03-08 DIAGNOSIS — G894 Chronic pain syndrome: Secondary | ICD-10-CM | POA: Diagnosis not present

## 2016-03-08 DIAGNOSIS — M961 Postlaminectomy syndrome, not elsewhere classified: Secondary | ICD-10-CM | POA: Diagnosis not present

## 2016-03-13 DIAGNOSIS — E063 Autoimmune thyroiditis: Secondary | ICD-10-CM | POA: Diagnosis not present

## 2016-03-13 DIAGNOSIS — E038 Other specified hypothyroidism: Secondary | ICD-10-CM | POA: Diagnosis not present

## 2016-03-14 ENCOUNTER — Encounter: Payer: Self-pay | Admitting: Obstetrics and Gynecology

## 2016-03-14 ENCOUNTER — Ambulatory Visit (INDEPENDENT_AMBULATORY_CARE_PROVIDER_SITE_OTHER): Payer: Medicare Other | Admitting: Obstetrics and Gynecology

## 2016-03-14 ENCOUNTER — Ambulatory Visit (INDEPENDENT_AMBULATORY_CARE_PROVIDER_SITE_OTHER): Payer: Medicare Other

## 2016-03-14 VITALS — BP 116/72 | HR 72 | Ht 67.0 in | Wt 202.0 lb

## 2016-03-14 DIAGNOSIS — N950 Postmenopausal bleeding: Secondary | ICD-10-CM

## 2016-03-14 DIAGNOSIS — R3915 Urgency of urination: Secondary | ICD-10-CM | POA: Diagnosis not present

## 2016-03-14 DIAGNOSIS — R102 Pelvic and perineal pain: Secondary | ICD-10-CM

## 2016-03-14 MED ORDER — PENTOSAN POLYSULFATE SODIUM 100 MG PO CAPS: 100.0000 mg | ORAL_CAPSULE | Freq: Three times a day (TID) | ORAL | 3 refills | Status: DC

## 2016-03-14 NOTE — Patient Instructions (Signed)
Interstitial Cystitis Interstitial cystitis is a condition that causes inflammation of the bladder. The bladder is a hollow organ in the lower part of your abdomen. It stores urine after the urine is made by your kidneys. With interstitial cystitis, you may have pain in the bladder area. You may also have a frequent and urgent need to urinate. The severity of interstitial cystitis can vary from person to person. You may have flare-ups of the condition, and then it may go away for a while. For many people who have this condition, it becomes a long-term problem. What are the causes? The cause of this condition is not known. What increases the risk? This condition is more likely to develop in women. What are the signs or symptoms? Symptoms of interstitial cystitis vary, and they can change over time. Symptoms may include:  Discomfort or pain in the bladder area. This can range from mild to severe. The pain may change in intensity as the bladder fills with urine or as it empties.  Pelvic pain.  An urgent need to urinate.  Frequent urination.  Pain during sexual intercourse.  Pinpoint bleeding on the bladder wall. For women, the symptoms often get worse during menstruation. How is this diagnosed? This condition is diagnosed by evaluating your symptoms and ruling out other causes. A physical exam will be done. Various tests may be done to rule out other conditions. Common tests include:  Urine tests.  Cystoscopy. In this test, a tool that is like a very thin telescope is used to look into your bladder.  Biopsy. This involves taking a sample of tissue from the bladder wall to be examined under a microscope. How is this treated? There is no cure for interstitial cystitis, but treatment methods are available to control your symptoms. Work closely with your health care provider to find the treatments that will be most effective for you. Treatment options may include:  Medicines to relieve pain  and to help reduce the number of times that you feel the need to urinate.  Bladder training. This involves learning ways to control when you urinate, such as:  Urinating at scheduled times.  Training yourself to delay urination.  Doing exercises (Kegel exercises) to strengthen the muscles that control urine flow.  Lifestyle changes, such as changing your diet or taking steps to control stress.  Use of a device that provides electrical stimulation in order to reduce pain.  A procedure that stretches your bladder by filling it with air or fluid.  Surgery. This is rare. It is only done for extreme cases if other treatments do not help. Follow these instructions at home:  Take medicines only as directed by your health care provider.  Use bladder training techniques as directed.  Keep a bladder diary to find out which foods, liquids, or activities make your symptoms worse.  Use your bladder diary to schedule bathroom trips. If you are away from home, plan to be near a bathroom at each of your scheduled times.  Make sure you urinate just before you leave the house and just before you go to bed.  Do Kegel exercises as directed by your health care provider.  Do not drink alcohol.  Do not use any tobacco products, including cigarettes, chewing tobacco, or electronic cigarettes. If you need help quitting, ask your health care provider.  Make dietary changes as directed by your health care provider. You may need to avoid spicy foods and foods that contain a high amount of potassium.  Limit your drinking of beverages that stimulate urination. These include soda, coffee, and tea.  Keep all follow-up visits as directed by your health care provider. This is important. Contact a health care provider if:  Your symptoms do not get better after treatment.  Your pain and discomfort are getting worse.  You have more frequent urges to urinate.  You have a fever. Get help right away  if:  You are not able to control your bladder at all. This information is not intended to replace advice given to you by your health care provider. Make sure you discuss any questions you have with your health care provider. Document Released: 08/19/2003 Document Revised: 05/26/2015 Document Reviewed: 08/25/2013 Elsevier Interactive Patient Education  2017 [employer].

## 2016-03-14 NOTE — Progress Notes (Signed)
Gynecology Ultrasound Follow Up  Chief Complaint:  Chief Complaint  Patient presents with  . GYN U/S follow up     History of Present Illness: Patient is a 51 y.o. female who presents today for ultrasound evaluation for postmenopausal spotting .  Ultrasound demonstrates the following findgins Adnexa: no masses seen  Uterus: anteverted with endometrial stripe  1.6 mm Additional: no free fluid   We have a normal pelvic ultrasound at today's visit without anything to explain the patient symptoms of pelvic pressure/discomfort.  She does have very prominent urinary symptoms as previously noted and has undergone urodynamics work up, tibial nerve stimulation, trial of antispasmotics at [facility] urogynecology.  Continuous to report frequency, urgency, double voiding.  Review of Systems: Review of Systems  Genitourinary: Positive for frequency and urgency. Negative for dysuria, flank pain and hematuria.    Past Medical History:  Past Medical History:  Diagnosis Date  . Abnormal MRI scan, bone 2012   abnormal marrow signal humeral head, glenoid and scapula - diffuse replacement of fatty marrow - likely benign, no need for further investigation ([place])  . Agnosia for temperature    Right Leg  . Anemia    etiology unknown-iron infusion in past,last 9/12  . Anxiety   . Anxiety and depression   . 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 Arthritis   . Chronic pain    pain contract with Dr. Eartha Inch Pain  . DDD (degenerative disc disease), cervical   . DDD (degenerative disc disease), lumbar    s/p permanent nerve damage after back surgery  . Depression   . Depression with anxiety   . Diverticulosis   . Eczema   . Family history of adverse reaction to anesthesia    Mother - nausea  . Fibromyalgia   . Foot drop    right - numbness, tingling - twisting  . Gallstones   . GERD (gastroesophageal reflux disease)    rare zantac PRN N/V at times with this  . GERD  (gastroesophageal reflux disease)   . Headache(784.0)    migraines  . Hemorrhoid   . Hemorrhoids   . History of kidney stones   . Hyperlipidemia   . Hypothyroidism 1990s   hashimoto's thyroiditis  . IBS (irritable bowel syndrome)   . IBS (irritable bowel syndrome)   . Incontinence of urine    s/p sling procedure-unresolved  . Iron deficiency    h/o anemia, s/p iron infusion (2012)  . Liver hemangioma     x2  . Memory change    due to medications  . Migraines    and h/o optical migraine  . Migraines   . Mixed urge and stress incontinence 2008   extensive workup including urodynamics, failed miltiple anticholinergics including myrbetriq and urogesic blue s/p PTNS, normal cystoscopy 2014, seen by Dr Matilde Sprang and Dr Erlene Quan  . Neurogenic bladder 2008   after 13 spine surgeries with periph neuropathy and chronic LBP  . Neuropathy of lower extremity 2011  . Nocturia   . Notalgia paresthetica   . Osteoarthritis 12/2013  . PONV (postoperative nausea and vomiting)   . Pulmonary nodule 04/2012   7.48mm RLL nodule - resolved on imaging 12/2012  . Sleep apnea    mild-doesn't use cpap  . Urine frequency   . Vitamin D deficiency     Past Surgical History:  Past Surgical History:  Procedure Laterality Date  . ANTERIOR CERVICAL DECOMP/DISCECTOMY FUSION     C3, 4,  5 - improved after surgery  . BACK SURGERY     multiple-L4-s1,hardware,fusion,removal,infection s/p 5/11 procedure  . BACK SURGERY  02/16/11   "my 7th back OR; Procedure: Exploration of fusion removal of hardware L4-S1, harvesting of right iliac crest bone graft, redo posterior lateral fusion L4-5 using iliac crest bone graft BMP and master graft replacement of bilateral L4 screws with a Telfa tech 7.5 x 40 mm screw on the right and 8.5 x 40 mm screw on the left placement of a large Hemovac drain  . CERVICAL FUSION     x2  . [place]; 2001   x 2  . CHOLECYSTECTOMY  2001  . COLONOSCOPY    .  ESOPHAGOGASTRODUODENOSCOPY  03/2013   early esophageal stricture dilated, o/w WNL Deatra Ina)  . ESOPHAGOGASTRODUODENOSCOPY  12/2014   WNL, s/p empiric dilation of esophagus Henrene Pastor)  . ESOPHAGOGASTRODUODENOSCOPY  10/2015   WNL, biopsies WNL as well (minimal esophagitis with reactive changes) Benson Norway)  . HEMORRHOID BANDING    . LASIK Bilateral   . LUMBAR WOUND DEBRIDEMENT  03/15/2011   Procedure: LUMBAR WOUND DEBRIDEMENT;  Surgeon: Elaina Hoops, MD;  Lumbar Wound Debridement  . MANDIBLE RECONSTRUCTION  1986  . PFT  01/28/2013   WNL  . PUBOVAGINAL SLING  2008   midurethral sling  . SHOULDER CLOSED REDUCTION Left 12/18/2010   Procedure: CLOSED MANIPULATION SHOULDER;  Surgeon: Johnn Hai; labrial debridement  . SPINAL CORD STIMULATOR INSERTION N/A 08/19/2015   Procedure: LUMBAR SPINAL CORD STIMULATOR INSERTION;  Surgeon: Erline Levine, MD;  Location: [facility];  Service: Neurosurgery;  Laterality: N/A;  LUMBAR SPINAL CORD STIMULATOR INSERTION  . SPINE SURGERY    . TONSILLECTOMY  1972   as a child; "then they grew back"    Gynecologic History:  Patient's last menstrual period was 10/04/2014.   Family History:  Family History  Problem Relation Age of Onset  . Lung cancer Father 105     (smoker)  . Diabetes Brother   . Thyroid disease Mother     ?empty sella  . Irritable bowel syndrome Mother   . Dysphagia Mother   . CAD Maternal Grandfather     MI  . Heart disease Maternal Grandfather   . Heart attack Maternal Grandfather   . Lung cancer Paternal Grandfather      (smoker)  . Colon cancer Paternal Grandmother   . Thyroid disease Cousin   . Cancer Other     unsure (ovarian/uterine)  . Breast cancer Maternal Aunt   . Alzheimer's disease Maternal Grandmother   . Diabetes Daughter     Social History:  Social History   Social History  . Marital status: Married    Spouse name: N/A  . Number of children: 2  . Years of education: N/A   Occupational History  . disabled     Social History Main Topics  . Smoking status: Never Smoker  . Smokeless tobacco: Never Used  . Alcohol use No  . Drug use: No  . Sexual activity: Not Currently   Other Topics Concern  . Not on file   Social History Narrative   Lives with husband and 2 daughters (one in college)   Daughter of Karen Aguilar   Occupation: was Public affairs consultant   Disability since 2011 - arachnoiditis   Edu: associate's degree   Activity: severe limitations 2/2 arachnoiditis and chronic pain, walks to end of driveway and back   Diet: good water, fruits/vegetables daily  Allergies:  Allergies  Allergen Reactions  . Erythromycin Nausea And Vomiting  . Fluconazole Swelling    Lips - swelling, bleeding, blisters, cracking, peeling  . Thimerosal     Local reaction-redness,swelling; veins popping  . Augmentin [Amoxicillin-Pot Clavulanate] Swelling    Lip swelling and irritation.   . Gabapentin Other (See Comments)    Short term memory loss  . Imitrex [Sumatriptan] Other (See Comments)    Rebound migraine   . Influenza Vaccines Hives and Swelling    Redness, heat, and extreme swelling at site  . Levothyroxine Hives  . Lyrica [Pregabalin] Other (See Comments)    Short term memory loss  . Tomato Other (See Comments)    Severe acid reflux with all acid foods  . Adhesive [Tape] Rash    blisters  . Azithromycin Rash  . Macrolides And Ketolides Rash  . Nickel Swelling and Rash  . Sulfa Antibiotics Rash    Medications: Prior to Admission medications   Medication Sig Start Date End Date Taking? Authorizing Provider  clobetasol ointment (TEMOVATE) 1.61 % Apply 1 application topically as needed (for eczema).    Yes Historical Provider, MD  clonazePAM (KLONOPIN) 0.5 MG tablet Take 1 tablet (0.5 mg total) by mouth 2 (two) times daily as needed for anxiety. First week take nightly 02/17/16  Yes Ria Bush, MD  DEXILANT 60 MG capsule TAKE 1 CAPSULE(60 MG) BY MOUTH TWICE DAILY  02/27/16  Yes Irene Shipper, MD  diclofenac sodium (VOLTAREN) 1 % GEL APPLY THIN LAYER EXTERNALLY TO THE AFFECTED AREA UP TO FOUR TIMES DAILY AS NEEDED 02/27/16  Yes Mcarthur Rossetti, MD  dronabinol (MARINOL) 2.5 MG capsule Take 2.5 mg by mouth daily as needed (for nausea and pain).  02/03/14  Yes Historical Provider, MD  escitalopram (LEXAPRO) 10 MG tablet TAKE 1 TABLET BY MOUTH EVERY DAY 01/26/16  Yes Ria Bush, MD  hyoscyamine (LEVSIN SL) 0.125 MG SL tablet DISSOLVE 1 TABLET(0.125 MG) UNDER THE TONGUE EVERY 4 HOURS AS NEEDED 06/29/15  Yes Irene Shipper, MD  metroNIDAZOLE (METROGEL) 1 % gel Apply 1 application topically 2 (two) times daily as needed (for rosacea acne).  11/18/14  Yes Historical Provider, MD  Phenylephrine-DM-GG-APAP (MUCINEX SINUS-MAX) 5-10-200-325 MG CAPS Take 2 tablets by mouth 2 (two) times daily as needed (for migraine).    Yes Historical Provider, MD  Tapentadol HCl (NUCYNTA) 100 MG TABS Take 100 mg by mouth every 8 (eight) hours.    Yes Historical Provider, MD  thyroid (ARMOUR) 60 MG tablet Take 60 mg by mouth daily before breakfast. Three days a week take extra tablet daily 10/10/15  Yes Ria Bush, MD  topiramate (TOPAMAX) 100 MG tablet TAKE 1 TABLET BY MOUTH EVERY DAY 07/25/15  Yes Ria Bush, MD  Ascorbic Acid (VITAMIN C PO) Take 1 tablet by mouth as needed (for immune system support).     Historical Provider, MD  baclofen (LIORESAL) 10 MG tablet Take 10 mg by mouth 3 (three) times daily.    Historical Provider, MD  calcium carbonate (TUMS SMOOTHIES) 750 MG chewable tablet Chew 2-3 tablets by mouth 2 (two) times daily.    Historical Provider, MD  camphor-menthol Timoteo Ace) lotion Apply 1 application topically at bedtime as needed for itching.     Historical Provider, MD  Cholecalciferol (VITAMIN D) 2000 units CAPS Take 1 capsule (2,000 Units total) by mouth daily. 10/10/15   Ria Bush, MD  pentosan polysulfate (ELMIRON) 100 MG capsule Take 1 capsule (100  mg total)  by mouth 3 (three) times daily. 03/14/16   Malachy Mood, MD    Physical Exam Vitals: Blood pressure 116/72, pulse 72, height 5\' 7"  (1.702 m), weight 202 lb (91.6 kg), last menstrual period 10/04/2014.  General: NAD HEENT: normocephalic, anicteric Pulmonary: No increased work of breathing Extremities: no edema, erythema, or tenderness Neurologic: Grossly intact, normal gait Psychiatric: mood appropriate, affect full   Assessment: 51 y.o. G2P2 with urinary discomfort and postmenopausal bleeding  Plan: Problem List Items Addressed This Visit    None    Visit Diagnoses    Urgency of micturition    -  Primary   Postmenopausal bleeding          1) Urinary discomfort - extensive past work up.  Will trial patient on Elmiron for possible interstitial cystitis.  We discussed that if no improvement in symptoms on this regimen we may be dealing with effects of her overall neuropathy.  Diagnostic laparoscopy is unlikely give a clear answer but may be the only other modality to determine if any pathology is evident not visible on prior imaging  2) Postmenopausal bleeding - We had previously discussed that menopause is a clinical diagnosis made after 12 months of amenorrhea.  The average age of menopause in the  [place] population is 49 but there may be significant variation.  Any bleeding that happens after a 12 month period of amenorrhea warrants further work.  Possible etiologies of postmenopausal bleeding were discussed with the patient today.  These may range from benign etiologies such as urethral prolapse and atrophy, to indeterminate lesions such as submucosal fibroids or polyps which would require resection to accurately evaluate. The role of unopposed estrogen in the development of  dndometrial hyperplasia or carcinoma is discussed.  The risk of endometrial hyperplasia is linearly correlated with increasing BMI given the production of estrone by adipose tissue.  Work up will be  include transvaginal ultrasound to assess the thickness of the endometrial lining as well as to assess for focal uterine lesions.  Negative ultrasound evaluation, defined as the absence of focal lesions and endometrial stripe of <26mm, effectively rules out carcinoma and confirms atrophy as the most likely etiology.  Should focal lesions be present these generally require hysteroscopic resection.  Should lining be greater >35mm endometrial biopsy is warranted to rule out hyperplasia or frank endometrial cancer.  Continued episodes of bleeding despite negative ultrasound also warrant endometrial sampling.  Given that findings today show a homogenous endometrial stripe measuring a scant 1.63mm no further work up is indicated at this time.  3) A total of 15 minutes were spent in face-to-face contact with the patient during this encounter with over half of that time devoted to counseling and coordination of care.

## 2016-03-15 ENCOUNTER — Ambulatory Visit: Payer: No Typology Code available for payment source | Admitting: Psychology

## 2016-03-16 ENCOUNTER — Encounter: Payer: Self-pay | Admitting: Obstetrics and Gynecology

## 2016-03-20 DIAGNOSIS — M542 Cervicalgia: Secondary | ICD-10-CM | POA: Diagnosis not present

## 2016-03-20 DIAGNOSIS — E063 Autoimmune thyroiditis: Secondary | ICD-10-CM | POA: Diagnosis not present

## 2016-03-20 DIAGNOSIS — M5412 Radiculopathy, cervical region: Secondary | ICD-10-CM | POA: Diagnosis not present

## 2016-03-20 DIAGNOSIS — M5023 Other cervical disc displacement, cervicothoracic region: Secondary | ICD-10-CM | POA: Diagnosis not present

## 2016-03-20 DIAGNOSIS — E038 Other specified hypothyroidism: Secondary | ICD-10-CM | POA: Diagnosis not present

## 2016-03-22 DIAGNOSIS — M5021 Other cervical disc displacement,  high cervical region: Secondary | ICD-10-CM | POA: Diagnosis not present

## 2016-03-22 DIAGNOSIS — M5412 Radiculopathy, cervical region: Secondary | ICD-10-CM | POA: Diagnosis not present

## 2016-03-26 DIAGNOSIS — M5023 Other cervical disc displacement, cervicothoracic region: Secondary | ICD-10-CM | POA: Diagnosis not present

## 2016-03-26 DIAGNOSIS — M542 Cervicalgia: Secondary | ICD-10-CM | POA: Diagnosis not present

## 2016-03-27 ENCOUNTER — Other Ambulatory Visit: Payer: Self-pay | Admitting: Family Medicine

## 2016-03-27 NOTE — Telephone Encounter (Signed)
Ok to refill? Last written 02/17/16 to fill after 02/22/16 #60 0RF

## 2016-03-27 NOTE — Telephone Encounter (Signed)
plz phone in. 

## 2016-03-28 NOTE — Telephone Encounter (Signed)
Rx called in as directed.   

## 2016-03-29 ENCOUNTER — Ambulatory Visit (INDEPENDENT_AMBULATORY_CARE_PROVIDER_SITE_OTHER): Payer: No Typology Code available for payment source | Admitting: Psychology

## 2016-03-29 DIAGNOSIS — F332 Major depressive disorder, recurrent severe without psychotic features: Secondary | ICD-10-CM | POA: Diagnosis not present

## 2016-04-09 DIAGNOSIS — M5412 Radiculopathy, cervical region: Secondary | ICD-10-CM | POA: Diagnosis not present

## 2016-04-12 ENCOUNTER — Ambulatory Visit (INDEPENDENT_AMBULATORY_CARE_PROVIDER_SITE_OTHER): Payer: No Typology Code available for payment source | Admitting: Psychology

## 2016-04-12 DIAGNOSIS — F332 Major depressive disorder, recurrent severe without psychotic features: Secondary | ICD-10-CM | POA: Diagnosis not present

## 2016-04-25 ENCOUNTER — Encounter: Payer: Self-pay | Admitting: Obstetrics and Gynecology

## 2016-04-25 ENCOUNTER — Ambulatory Visit (INDEPENDENT_AMBULATORY_CARE_PROVIDER_SITE_OTHER): Payer: Medicare Other | Admitting: Obstetrics and Gynecology

## 2016-04-25 VITALS — BP 106/60 | HR 74 | Wt 200.0 lb

## 2016-04-25 DIAGNOSIS — N319 Neuromuscular dysfunction of bladder, unspecified: Secondary | ICD-10-CM | POA: Diagnosis not present

## 2016-04-25 DIAGNOSIS — Z1239 Encounter for other screening for malignant neoplasm of breast: Secondary | ICD-10-CM

## 2016-04-25 DIAGNOSIS — Z1231 Encounter for screening mammogram for malignant neoplasm of breast: Secondary | ICD-10-CM

## 2016-04-25 NOTE — Progress Notes (Signed)
Obstetrics & Gynecology Office Visit   Chief Complaint:  Chief Complaint  Patient presents with  . Follow-up    History of Present Illness: 51 yo patient presenting for follow up of symptoms of pelvic fullness.  She has had a normal exam, normal transvaginal utlrasound.  She has known neurogenic bladder.  She trial elmiron to see if there was a component of IC, but developed nausea, emesis, and abdominal pain which resulted in her discontinuing the medication and resolution of symptoms.  At this point I don't have any additional option to offer to the patient and I suggest she seek a second opinion from another urologist although I discussed that she may have exhausted all her therapeutic options.    She has had episodes of urinary retention but has not had to self catheterize.     Review of Systemst Review of Systems  Genitourinary: Negative for dysuria, flank pain, frequency, hematuria and urgency.    Past Medical History:  Past Medical History:  Diagnosis Date  . Abnormal MRI scan, bone 2012   abnormal marrow signal humeral head, glenoid and scapula - diffuse replacement of fatty marrow - likely benign, no need for further investigation ([place])  . Agnosia for temperature    Right Leg  . Anemia    etiology unknown-iron infusion in past,last 9/12  . Anxiety   . Anxiety and depression   . 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 Arthritis   . Chronic pain    pain contract with Dr. Eartha Inch Pain  . DDD (degenerative disc disease), cervical   . DDD (degenerative disc disease), lumbar    s/p permanent nerve damage after back surgery  . Depression   . Depression with anxiety   . Diverticulosis   . Eczema   . Family history of adverse reaction to anesthesia    Mother - nausea  . Fibromyalgia   . Foot drop    right - numbness, tingling - twisting  . Gallstones   . GERD (gastroesophageal reflux disease)    rare zantac PRN N/V at times with this  . GERD  (gastroesophageal reflux disease)   . Headache(784.0)    migraines  . Hemorrhoid   . Hemorrhoids   . History of kidney stones   . Hyperlipidemia   . Hypothyroidism 1990s   hashimoto's thyroiditis  . IBS (irritable bowel syndrome)   . IBS (irritable bowel syndrome)   . Incontinence of urine    s/p sling procedure-unresolved  . Iron deficiency    h/o anemia, s/p iron infusion (2012)  . Liver hemangioma     x2  . Memory change    due to medications  . Migraines    and h/o optical migraine  . Migraines   . Mixed urge and stress incontinence 2008   extensive workup including urodynamics, failed miltiple anticholinergics including myrbetriq and urogesic blue s/p PTNS, normal cystoscopy 2014, seen by Dr Matilde Sprang and Dr Erlene Quan  . Neurogenic bladder 2008   after 13 spine surgeries with periph neuropathy and chronic LBP  . Neuropathy of lower extremity 2011  . Nocturia   . Notalgia paresthetica   . Osteoarthritis 12/2013  . PONV (postoperative nausea and vomiting)   . Pulmonary nodule 04/2012   7.31mm RLL nodule - resolved on imaging 12/2012  . Sleep apnea    mild-doesn't use cpap  . Urine frequency   . Vitamin D deficiency     Past Surgical History:  Past Surgical History:  Procedure Laterality Date  . ANTERIOR CERVICAL DECOMP/DISCECTOMY FUSION     C3, 4, 5 - improved after surgery  . BACK SURGERY     multiple-L4-s1,hardware,fusion,removal,infection s/p 5/11 procedure  . BACK SURGERY  02/16/11   "my 7th back OR; Procedure: Exploration of fusion removal of hardware L4-S1, harvesting of right iliac crest bone graft, redo posterior lateral fusion L4-5 using iliac crest bone graft BMP and master graft replacement of bilateral L4 screws with a Telfa tech 7.5 x 40 mm screw on the right and 8.5 x 40 mm screw on the left placement of a large Hemovac drain  . CERVICAL FUSION     x2  . [place]; 2001   x 2  . CHOLECYSTECTOMY  2001  . COLONOSCOPY    .  ESOPHAGOGASTRODUODENOSCOPY  03/2013   early esophageal stricture dilated, o/w WNL Deatra Ina)  . ESOPHAGOGASTRODUODENOSCOPY  12/2014   WNL, s/p empiric dilation of esophagus Henrene Pastor)  . ESOPHAGOGASTRODUODENOSCOPY  10/2015   WNL, biopsies WNL as well (minimal esophagitis with reactive changes) Benson Norway)  . HEMORRHOID BANDING    . LASIK Bilateral   . LUMBAR WOUND DEBRIDEMENT  03/15/2011   Procedure: LUMBAR WOUND DEBRIDEMENT;  Surgeon: Elaina Hoops, MD;  Lumbar Wound Debridement  . MANDIBLE RECONSTRUCTION  1986  . PFT  01/28/2013   WNL  . PUBOVAGINAL SLING  2008   midurethral sling  . SHOULDER CLOSED REDUCTION Left 12/18/2010   Procedure: CLOSED MANIPULATION SHOULDER;  Surgeon: Johnn Hai; labrial debridement  . SPINAL CORD STIMULATOR INSERTION N/A 08/19/2015   Procedure: LUMBAR SPINAL CORD STIMULATOR INSERTION;  Surgeon: Erline Levine, MD;  Location: [facility];  Service: Neurosurgery;  Laterality: N/A;  LUMBAR SPINAL CORD STIMULATOR INSERTION  . SPINE SURGERY    . TONSILLECTOMY  1972   as a child; "then they grew back"    Gynecologic History: Patient's last menstrual period was 10/04/2014.  Obstetric History: G2P2  Family History:  Family History  Problem Relation Age of Onset  . Lung cancer Father 34     (smoker)  . Diabetes Brother   . Thyroid disease Mother     ?empty sella  . Irritable bowel syndrome Mother   . Dysphagia Mother   . CAD Maternal Grandfather     MI  . Heart disease Maternal Grandfather   . Heart attack Maternal Grandfather   . Lung cancer Paternal Grandfather      (smoker)  . Colon cancer Paternal Grandmother   . Thyroid disease Cousin   . Cancer Other     unsure (ovarian/uterine)  . Breast cancer Maternal Aunt   . Alzheimer's disease Maternal Grandmother   . Diabetes Daughter     Social History:  Social History   Social History  . Marital status: Married    Spouse name: N/A  . Number of children: 2  . Years of education: N/A   Occupational  History  . disabled    Social History Main Topics  . Smoking status: Never Smoker  . Smokeless tobacco: Never Used  . Alcohol use No  . Drug use: No  . Sexual activity: Not Currently   Other Topics Concern  . Not on file   Social History Narrative   Lives with husband and 2 daughters (one in college)   Daughter of Lurena Joiner   Occupation: was Public affairs consultant   Disability since 2011 - arachnoiditis   Edu: associate's degree   Activity: severe  limitations 2/2 arachnoiditis and chronic pain, walks to end of driveway and back   Diet: good water, fruits/vegetables daily    Allergies:  Allergies  Allergen Reactions  . Erythromycin Nausea And Vomiting  . Fluconazole Swelling    Lips - swelling, bleeding, blisters, cracking, peeling  . Thimerosal     Local reaction-redness,swelling; veins popping  . Augmentin [Amoxicillin-Pot Clavulanate] Swelling    Lip swelling and irritation.   . Gabapentin Other (See Comments)    Short term memory loss  . Imitrex [Sumatriptan] Other (See Comments)    Rebound migraine   . Influenza Vaccines Hives and Swelling    Redness, heat, and extreme swelling at site  . Levothyroxine Hives  . Lyrica [Pregabalin] Other (See Comments)    Short term memory loss  . Tomato Other (See Comments)    Severe acid reflux with all acid foods  . Adhesive [Tape] Rash    blisters  . Azithromycin Rash  . Macrolides And Ketolides Rash  . Nickel Swelling and Rash  . Sulfa Antibiotics Rash    Medications: Prior to Admission medications   Medication Sig Start Date End Date Taking? Authorizing Provider  Ascorbic Acid (VITAMIN C PO) Take 1 tablet by mouth as needed (for immune system support).     Historical Provider, MD  baclofen (LIORESAL) 10 MG tablet Take 10 mg by mouth 3 (three) times daily.    Historical Provider, MD  calcium carbonate (TUMS SMOOTHIES) 750 MG chewable tablet Chew 2-3 tablets by mouth 2 (two) times daily.    Historical  Provider, MD  camphor-menthol Timoteo Ace) lotion Apply 1 application topically at bedtime as needed for itching.     Historical Provider, MD  Cholecalciferol (VITAMIN D) 2000 units CAPS Take 1 capsule (2,000 Units total) by mouth daily. 10/10/15   Ria Bush, MD  clobetasol ointment (TEMOVATE) 6.94 % Apply 1 application topically as needed (for eczema).     Historical Provider, MD  clonazePAM (KLONOPIN) 0.5 MG tablet TAKE 1 TABLET BY MOUTH TWICE DAILY AS NEEDED FOR ANXIETY. FIRST WEEK TAKE NIGHTLY 03/27/16   Ria Bush, MD  DEXILANT 60 MG capsule TAKE 1 CAPSULE(60 MG) BY MOUTH TWICE DAILY 02/27/16   Irene Shipper, MD  diclofenac sodium (VOLTAREN) 1 % GEL APPLY THIN LAYER EXTERNALLY TO THE AFFECTED AREA UP TO FOUR TIMES DAILY AS NEEDED 02/27/16   Mcarthur Rossetti, MD  dronabinol (MARINOL) 2.5 MG capsule Take 2.5 mg by mouth daily as needed (for nausea and pain).  02/03/14   Historical Provider, MD  escitalopram (LEXAPRO) 10 MG tablet TAKE 1 TABLET BY MOUTH EVERY DAY 03/27/16   Ria Bush, MD  hyoscyamine (LEVSIN SL) 0.125 MG SL tablet DISSOLVE 1 TABLET(0.125 MG) UNDER THE TONGUE EVERY 4 HOURS AS NEEDED 06/29/15   Irene Shipper, MD  metroNIDAZOLE (METROGEL) 1 % gel Apply 1 application topically 2 (two) times daily as needed (for rosacea acne).  11/18/14   Historical Provider, MD  Phenylephrine-DM-GG-APAP (MUCINEX SINUS-MAX) 5-10-200-325 MG CAPS Take 2 tablets by mouth 2 (two) times daily as needed (for migraine).     Historical Provider, MD  Tapentadol HCl (NUCYNTA) 100 MG TABS Take 100 mg by mouth every 8 (eight) hours.     Historical Provider, MD  thyroid (ARMOUR) 60 MG tablet Take 60 mg by mouth daily before breakfast. Three days a week take extra tablet daily 10/10/15   Ria Bush, MD  topiramate (TOPAMAX) 100 MG tablet TAKE 1 TABLET BY MOUTH EVERY DAY 07/25/15  Ria Bush, MD    Physical Exam Vitals:  Vitals:   04/25/16 1446  BP: 106/60  Pulse: 74   Patient's last  menstrual period was 10/04/2014.  General: NAD HEENT: normocephalic, anicteric Pulmonary: No increased work of breathing Psychiatric: mood appropriate, affect full  Female chaperone present for pelvic and breast  portions of the physical exam  Assessment: 51 y.o. G2P2 with neurogenic bladder  Plan: Problem List Items Addressed This Visit      Other   Neurogenic bladder    Other Visit Diagnoses    Breast cancer screening    -  Primary   Relevant Orders   MM DIGITAL SCREENING BILATERAL      - referral [facility] - routine annual exam in 1-2 months, mammogram ordered -A total of 15 minutes were spent in face-to-face contact with the patient during this encounter with over half of that time devoted to counseling and coordination of care.

## 2016-04-26 ENCOUNTER — Ambulatory Visit (INDEPENDENT_AMBULATORY_CARE_PROVIDER_SITE_OTHER): Payer: No Typology Code available for payment source | Admitting: Psychology

## 2016-04-26 ENCOUNTER — Other Ambulatory Visit: Payer: Self-pay | Admitting: Internal Medicine

## 2016-04-26 ENCOUNTER — Other Ambulatory Visit: Payer: Self-pay | Admitting: Family Medicine

## 2016-04-26 DIAGNOSIS — F332 Major depressive disorder, recurrent severe without psychotic features: Secondary | ICD-10-CM

## 2016-04-26 NOTE — Telephone Encounter (Signed)
Ok to refill? Last filled 03/27/16 #60 0RF

## 2016-04-27 ENCOUNTER — Encounter: Payer: Self-pay | Admitting: Family Medicine

## 2016-04-27 NOTE — Telephone Encounter (Signed)
plz phone in. 

## 2016-04-27 NOTE — Telephone Encounter (Signed)
Rx called in as directed.   

## 2016-04-30 NOTE — Telephone Encounter (Signed)
plz phone in - plz talk to someone at pharmacy to give Rx - apparently pharmacy never received Friday's refill message.

## 2016-05-03 ENCOUNTER — Telehealth: Payer: Self-pay | Admitting: Internal Medicine

## 2016-05-04 NOTE — Telephone Encounter (Signed)
Spoke with Sharyn Lull at [employer] and provided information she needed to fill out an additional form.  She said if we didn't have an answer today it would probably be Monday.

## 2016-05-04 NOTE — Telephone Encounter (Signed)
Scott from [employer] that [facility] Marcus has been approved starting today for a year. He states that he will be faxing over approval

## 2016-05-08 ENCOUNTER — Other Ambulatory Visit (INDEPENDENT_AMBULATORY_CARE_PROVIDER_SITE_OTHER): Payer: Self-pay | Admitting: Orthopaedic Surgery

## 2016-05-08 NOTE — Telephone Encounter (Signed)
Rx request 

## 2016-05-08 NOTE — Telephone Encounter (Signed)
Received faxed approval for Dexilant 60mg  through 05/04/2017

## 2016-05-09 DIAGNOSIS — M961 Postlaminectomy syndrome, not elsewhere classified: Secondary | ICD-10-CM | POA: Diagnosis not present

## 2016-05-09 DIAGNOSIS — G894 Chronic pain syndrome: Secondary | ICD-10-CM | POA: Diagnosis not present

## 2016-05-09 DIAGNOSIS — Z79891 Long term (current) use of opiate analgesic: Secondary | ICD-10-CM | POA: Diagnosis not present

## 2016-05-09 DIAGNOSIS — M5412 Radiculopathy, cervical region: Secondary | ICD-10-CM | POA: Diagnosis not present

## 2016-05-10 ENCOUNTER — Ambulatory Visit (INDEPENDENT_AMBULATORY_CARE_PROVIDER_SITE_OTHER): Payer: No Typology Code available for payment source | Admitting: Psychology

## 2016-05-10 DIAGNOSIS — F332 Major depressive disorder, recurrent severe without psychotic features: Secondary | ICD-10-CM | POA: Diagnosis not present

## 2016-05-10 DIAGNOSIS — Z6332 Other absence of family member: Secondary | ICD-10-CM

## 2016-05-24 ENCOUNTER — Ambulatory Visit (INDEPENDENT_AMBULATORY_CARE_PROVIDER_SITE_OTHER): Payer: No Typology Code available for payment source | Admitting: Psychology

## 2016-05-24 DIAGNOSIS — F332 Major depressive disorder, recurrent severe without psychotic features: Secondary | ICD-10-CM

## 2016-05-24 DIAGNOSIS — F4321 Adjustment disorder with depressed mood: Secondary | ICD-10-CM | POA: Diagnosis not present

## 2016-05-28 ENCOUNTER — Other Ambulatory Visit: Payer: Self-pay | Admitting: Family Medicine

## 2016-05-29 ENCOUNTER — Encounter: Payer: Self-pay | Admitting: Family Medicine

## 2016-05-29 NOTE — Telephone Encounter (Signed)
Medication phoned to pharmacy.  

## 2016-05-29 NOTE — Telephone Encounter (Signed)
Received refill electronically Last refill 04/27/16 #60 Last office visit 02/17/16

## 2016-05-29 NOTE — Telephone Encounter (Signed)
plz phone in. 

## 2016-06-01 ENCOUNTER — Encounter: Payer: Self-pay | Admitting: Emergency Medicine

## 2016-06-01 ENCOUNTER — Emergency Department
Admission: EM | Admit: 2016-06-01 | Discharge: 2016-06-01 | Disposition: A | Discharge status: Home / Self Care | Payer: Medicare Other | Attending: Emergency Medicine | Admitting: Emergency Medicine

## 2016-06-01 ENCOUNTER — Telehealth: Payer: Self-pay | Admitting: Family Medicine

## 2016-06-01 ENCOUNTER — Emergency Department: Payer: Medicare Other

## 2016-06-01 DIAGNOSIS — E039 Hypothyroidism, unspecified: Secondary | ICD-10-CM | POA: Diagnosis not present

## 2016-06-01 DIAGNOSIS — R202 Paresthesia of skin: Secondary | ICD-10-CM | POA: Diagnosis not present

## 2016-06-01 DIAGNOSIS — G43909 Migraine, unspecified, not intractable, without status migrainosus: Secondary | ICD-10-CM | POA: Diagnosis not present

## 2016-06-01 DIAGNOSIS — Z79899 Other long term (current) drug therapy: Secondary | ICD-10-CM | POA: Insufficient documentation

## 2016-06-01 DIAGNOSIS — R51 Headache: Secondary | ICD-10-CM | POA: Diagnosis not present

## 2016-06-01 DIAGNOSIS — R519 Headache, unspecified: Secondary | ICD-10-CM

## 2016-06-01 LAB — BASIC METABOLIC PANEL
ANION GAP: 7 (ref 5–15)
BUN: 15 mg/dL (ref 6–20)
CALCIUM: 9.3 mg/dL (ref 8.9–10.3)
CO2: 27 mmol/L (ref 22–32)
Chloride: 102 mmol/L (ref 101–111)
Creatinine, Ser: 0.94 mg/dL (ref 0.44–1.00)
GLUCOSE: 112 mg/dL — AB (ref 65–99)
Potassium: 3.9 mmol/L (ref 3.5–5.1)
SODIUM: 136 mmol/L (ref 135–145)

## 2016-06-01 LAB — CBC
HCT: 42.2 % (ref 35.0–47.0)
HEMOGLOBIN: 14.3 g/dL (ref 12.0–16.0)
MCH: 29 pg (ref 26.0–34.0)
MCHC: 33.9 g/dL (ref 32.0–36.0)
MCV: 85.4 fL (ref 80.0–100.0)
Platelets: 257 10*3/uL (ref 150–440)
RBC: 4.94 MIL/uL (ref 3.80–5.20)
RDW: 13.4 % (ref 11.5–14.5)
WBC: 6.1 10*3/uL (ref 3.6–11.0)

## 2016-06-01 MED ORDER — KETOROLAC TROMETHAMINE 30 MG/ML IJ SOLN
30.0000 mg | Freq: Once | INTRAMUSCULAR | Status: AC
  Administered 2016-06-01: 30 mg via INTRAVENOUS
  Filled 2016-06-01: qty 1

## 2016-06-01 MED ORDER — METOCLOPRAMIDE HCL 5 MG/ML IJ SOLN
10.0000 mg | Freq: Once | INTRAMUSCULAR | Status: AC
  Administered 2016-06-01: 10 mg via INTRAVENOUS
  Filled 2016-06-01: qty 2

## 2016-06-01 MED ORDER — LORAZEPAM 2 MG/ML IJ SOLN
1.0000 mg | Freq: Once | INTRAMUSCULAR | Status: AC
  Administered 2016-06-01: 1 mg via INTRAVENOUS
  Filled 2016-06-01: qty 1

## 2016-06-01 MED ORDER — SODIUM CHLORIDE 0.9 % IV SOLN
Freq: Once | INTRAVENOUS | Status: AC
  Administered 2016-06-01: 18:00:00 via INTRAVENOUS

## 2016-06-01 NOTE — ED Triage Notes (Signed)
Says frequent migraines, left side facial numbness started 2 days agoa dn is worse today.  Also weakness.

## 2016-06-01 NOTE — Telephone Encounter (Signed)
Noted. Agree.  Will await ER evaluation.  

## 2016-06-01 NOTE — ED Notes (Signed)
Patient returned from radiology

## 2016-06-01 NOTE — ED Notes (Signed)
Patient transported to CT 

## 2016-06-01 NOTE — Telephone Encounter (Signed)
[facility] Patient Name: Karen Aguilar DOB: November 16, 1965 Initial Comment Caller has been having a lot of severe migraines. Now left side of face is numb. Nurse Assessment Nurse: Dimas Chyle, RN, Dellis Filbert Date/Time Eilene Ghazi Time): 06/01/2016 11:58:26 AM Confirm and document reason for call. If symptomatic, describe symptoms. ---Caller has been having a lot of severe migraines. Now left side of face is numb. Left side of face is numb that has gotten worse today. Does the patient have any new or worsening symptoms? ---Yes Will a triage be completed? ---Yes Related visit to physician within the last 2 weeks? ---No Does the PT have any chronic conditions? (i.e. diabetes, asthma, etc.) ---Yes List chronic conditions. ---Migraines Is the patient pregnant or possibly pregnant? (Ask all females between the ages of 60-55) ---No Is this a behavioral health or substance abuse call? ---No Guidelines Guideline Title Affirmed Question Affirmed Notes Neurologic Deficit [1] Numbness (i.e., loss of sensation) of the face, arm / hand, or leg / foot on one side of the body AND [2] gradual onset (e.g., days to weeks) AND [3] present now Final Disposition User See Physician within 4 Hours (or PCP triage) Dimas Chyle, RN, Dellis Filbert Comments No appointment available in the recommended 4 hour time frame and caller unable to go to different office. Caller just wanted to follow up with PCP for next available appointment. Referrals GO TO FACILITY REFUSED Disagree/Comply: Comply

## 2016-06-01 NOTE — Telephone Encounter (Signed)
I spoke with pt and pt does have a h/a but has new symptom of numbness on lt side of face. Pt will go to [facility] ED now.FYI to Dr Darnell Level.

## 2016-06-01 NOTE — ED Provider Notes (Signed)
[facility] Emergency Department Provider Note       Time seen: ----------------------------------------- 5:14 PM on 06/01/2016 -----------------------------------------     I have reviewed the triage vital signs and the nursing notes.   HISTORY   Chief Complaint Numbness; Migraine; and Weakness    HPI Karen Aguilar is a 51 y.o. female who presents to the ED for diffuse headache with a history of migraines. Patient has some left-sided facial numbness that started 2 days ago and seemed to get worse today. She complains of generalized weakness as well as diffuse headache that is 6 out of 10, nothing makes the pain better or worse. Patient states this headache is not out of the ordinary, she takes Topamax chronically for headaches. She denies any recent fevers, chills or other complaints.   Past Medical History:  Diagnosis Date  . Abnormal MRI scan, bone 2012   abnormal marrow signal humeral head, glenoid and scapula - diffuse replacement of fatty marrow - likely benign, no need for further investigation ([place])  . Agnosia for temperature    Right Leg  . Anemia    etiology unknown-iron infusion in past,last 9/12  . Anxiety   . Anxiety and depression   . 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 Arthritis   . Chronic pain    pain contract with Dr. Eartha Inch Pain  . DDD (degenerative disc disease), cervical   . DDD (degenerative disc disease), lumbar    s/p permanent nerve damage after back surgery  . Depression   . Depression with anxiety   . Diverticulosis   . Eczema   . Family history of adverse reaction to anesthesia    Mother - nausea  . Fibromyalgia   . Foot drop    right - numbness, tingling - twisting  . Gallstones   . GERD (gastroesophageal reflux disease)    rare zantac PRN N/V at times with this  . GERD (gastroesophageal reflux disease)   . Headache(784.0)    migraines  . Hemorrhoid   . Hemorrhoids   . History of  kidney stones   . Hyperlipidemia   . Hypothyroidism 1990s   hashimoto's thyroiditis  . IBS (irritable bowel syndrome)   . IBS (irritable bowel syndrome)   . Incontinence of urine    s/p sling procedure-unresolved  . Iron deficiency    h/o anemia, s/p iron infusion (2012)  . Liver hemangioma     x2  . Memory change    due to medications  . Migraines    and h/o optical migraine  . Migraines   . Mixed urge and stress incontinence 2008   extensive workup including urodynamics, failed miltiple anticholinergics including myrbetriq and urogesic blue s/p PTNS, normal cystoscopy 2014, seen by Dr Matilde Sprang and Dr Erlene Quan  . Neurogenic bladder 2008   after 13 spine surgeries with periph neuropathy and chronic LBP  . Neuropathy of lower extremity 2011  . Nocturia   . Notalgia paresthetica   . Osteoarthritis 12/2013  . PONV (postoperative nausea and vomiting)   . Pulmonary nodule 04/2012   7.92mm RLL nodule - resolved on imaging 12/2012  . Sleep apnea    mild-doesn't use cpap  . Urine frequency   . Vitamin D deficiency     Patient Active Problem List   Diagnosis Date Noted  . GAD (generalized anxiety disorder) 02/18/2016  . Right shoulder pain 02/18/2016  . Pelvic pressure in female 01/23/2016  . Post-menopausal 10/10/2015  .  Urticaria 09/08/2015  . Neurogenic bladder   . Mixed urge and stress incontinence   . Chronic headache 09/22/2014  . Advanced care planning/counseling discussion 07/15/2014  . Health maintenance examination 07/15/2014  . Irritable bowel syndrome 02/23/2014  . Esophageal dysphagia 06/05/2013  . Gastroparesis 05/04/2013  . Solitary pulmonary nodule 03/18/2013  . Leg edema 02/13/2013  . Unspecified constipation 02/10/2013  . Internal hemorrhoids 02/10/2013  . Hypothyroidism   . Fibromyalgia   . GERD (gastroesophageal reflux disease)   . Chronic pain syndrome   . Sleep apnea   . MDD (major depressive disorder), recurrent episode, moderate ([place])   .  Arachnoiditis   . Hyperlipidemia   . Migraines   . Vitamin D deficiency   . Iron deficiency   . DDD (degenerative disc disease), lumbar     Past Surgical History:  Procedure Laterality Date  . ANTERIOR CERVICAL DECOMP/DISCECTOMY FUSION     C3, 4, 5 - improved after surgery  . BACK SURGERY     multiple-L4-s1,hardware,fusion,removal,infection s/p 5/11 procedure  . BACK SURGERY  02/16/11   "my 7th back OR; Procedure: Exploration of fusion removal of hardware L4-S1, harvesting of right iliac crest bone graft, redo posterior lateral fusion L4-5 using iliac crest bone graft BMP and master graft replacement of bilateral L4 screws with a Telfa tech 7.5 x 40 mm screw on the right and 8.5 x 40 mm screw on the left placement of a large Hemovac drain  . CERVICAL FUSION     x2  . [place]; 2001   x 2  . CHOLECYSTECTOMY  2001  . COLONOSCOPY    . ESOPHAGOGASTRODUODENOSCOPY  03/2013   early esophageal stricture dilated, o/w WNL Deatra Ina)  . ESOPHAGOGASTRODUODENOSCOPY  12/2014   WNL, s/p empiric dilation of esophagus Henrene Pastor)  . ESOPHAGOGASTRODUODENOSCOPY  10/2015   WNL, biopsies WNL as well (minimal esophagitis with reactive changes) Benson Norway)  . HEMORRHOID BANDING    . LASIK Bilateral   . LUMBAR WOUND DEBRIDEMENT  03/15/2011   Procedure: LUMBAR WOUND DEBRIDEMENT;  Surgeon: Elaina Hoops, MD;  Lumbar Wound Debridement  . MANDIBLE RECONSTRUCTION  1986  . PFT  01/28/2013   WNL  . PUBOVAGINAL SLING  2008   midurethral sling  . SHOULDER CLOSED REDUCTION Left 12/18/2010   Procedure: CLOSED MANIPULATION SHOULDER;  Surgeon: Johnn Hai; labrial debridement  . SPINAL CORD STIMULATOR INSERTION N/A 08/19/2015   Procedure: LUMBAR SPINAL CORD STIMULATOR INSERTION;  Surgeon: Erline Levine, MD;  Location: [facility];  Service: Neurosurgery;  Laterality: N/A;  LUMBAR SPINAL CORD STIMULATOR INSERTION  . SPINE SURGERY    . TONSILLECTOMY  1972   as a child; "then they grew back"     Allergies Erythromycin; Fluconazole; Thimerosal; Augmentin [amoxicillin-pot clavulanate]; Gabapentin; Imitrex [sumatriptan]; Influenza vaccines; Levothyroxine; Lyrica [pregabalin]; Tomato; Adhesive [tape]; Azithromycin; Macrolides and ketolides; Nickel; and Sulfa antibiotics  Social History Social History  Substance Use Topics  . Smoking status: Never Smoker  . Smokeless tobacco: Never Used  . Alcohol use No    Review of Systems Constitutional: Negative for fever. Eyes: Negative for vision changes ENT:  Negative for congestion, sore throat Cardiovascular: Negative for chest pain. Respiratory: Negative for shortness of breath. Gastrointestinal: Negative for abdominal pain, vomiting and diarrhea. Genitourinary: Negative for dysuria. Musculoskeletal: Negative for back pain. Positive for neck pain Skin: Negative for rash. Neurological: Positive for headaches, left facial paresthesias  All systems negative/normal/unremarkable except as stated in the HPI  ____________________________________________   PHYSICAL EXAM:  VITAL SIGNS: ED Triage Vitals  Enc Vitals Group     BP 06/01/16 1435 117/76     Pulse Rate 06/01/16 1435 86     Resp 06/01/16 1435 20     Temp 06/01/16 1435 98.2 F (36.8 C)     Temp Source 06/01/16 1435 Oral     SpO2 06/01/16 1435 98 %     Weight 06/01/16 1435 195 lb (88.5 kg)     Height 06/01/16 1435 5\' 7"  (1.702 m)     Head Circumference --      Peak Flow --      Pain Score 06/01/16 1444 0     Pain Loc --      Pain Edu? --      Excl. in [place]? --     Constitutional: Alert and oriented. Well appearing and in no distress. Eyes: Conjunctivae are normal. Normal extraocular movements. ENT   Head: Normocephalic and atraumatic.   Nose: No congestion/rhinnorhea.   Mouth/Throat: Mucous membranes are moist.   Neck: No stridor. Cardiovascular: Normal rate, regular rhythm. No murmurs, rubs, or gallops. Respiratory: Normal respiratory effort  without tachypnea nor retractions. Breath sounds are clear and equal bilaterally. No wheezes/rales/rhonchi. Gastrointestinal: Soft and nontender. Normal bowel sounds Musculoskeletal: Nontender with normal range of motion in extremities. No lower extremity tenderness nor edema. Neurologic:  Normal speech and language. No gross focal neurologic deficits are appreciated. Paresthesias noted to the left side of the face Skin:  Skin is warm, dry and intact. No rash noted. Psychiatric: Mood and affect are normal. Speech and behavior are normal.  ____________________________________________  EKG: Interpreted by me. Sinus rhythm rate 85 bpm, normal. We'll, normal QRS, normal QT.  ____________________________________________  ED COURSE:  Pertinent labs & imaging results that were available during my care of the patient were reviewed by me and considered in my medical decision making (see chart for details). Patient presents for headache and numbness, we will assess with labs and imaging as indicated.   Procedures ____________________________________________   LABS (pertinent positives/negatives)  Labs Reviewed  BASIC METABOLIC PANEL - Abnormal; Notable for the following:       Result Value   Glucose, Bld 112 (*)    All other components within normal limits  CBC    RADIOLOGY  CT head IMPRESSION: No acute intracranial abnormality. ____________________________________________  FINAL ASSESSMENT AND PLAN  Migraine, Paresthesias  Plan: Patient's labs and imaging were dictated above. Patient had presented for persistent migraine headache as well as left facial paresthesias. Workup here has been negative and headache is improved. Have advised taking the baby aspirin although I don't think her paresthesias are stroke or TIA related. It has been multiple days since her paresthesias started without signs of stroke on CT. She stable for outpatient follow-up with her doctor.   Earleen Newport, MD   Note: This note was generated in part or whole with voice recognition software. Voice recognition is usually quite accurate but there are transcription errors that can and very often do occur. I apologize for any typographical errors that were not detected and corrected.     Earleen Newport, MD 06/01/16 [phone]

## 2016-06-01 NOTE — ED Notes (Signed)
Patient able to ambulate to the restroom with no difficulty noted.

## 2016-06-05 ENCOUNTER — Ambulatory Visit (INDEPENDENT_AMBULATORY_CARE_PROVIDER_SITE_OTHER): Payer: Medicare Other | Admitting: Family Medicine

## 2016-06-05 ENCOUNTER — Encounter: Payer: Self-pay | Admitting: Family Medicine

## 2016-06-05 ENCOUNTER — Telehealth: Payer: Self-pay

## 2016-06-05 VITALS — BP 118/72 | HR 72 | Temp 98.8°F | Ht 67.0 in | Wt 196.8 lb

## 2016-06-05 DIAGNOSIS — G43109 Migraine with aura, not intractable, without status migrainosus: Secondary | ICD-10-CM | POA: Diagnosis not present

## 2016-06-05 DIAGNOSIS — N319 Neuromuscular dysfunction of bladder, unspecified: Secondary | ICD-10-CM

## 2016-06-05 DIAGNOSIS — F411 Generalized anxiety disorder: Secondary | ICD-10-CM | POA: Diagnosis not present

## 2016-06-05 MED ORDER — TOPIRAMATE 100 MG PO TABS: 100.0000 mg | ORAL_TABLET | Freq: Two times a day (BID) | ORAL | 1 refills | Status: DC

## 2016-06-05 MED ORDER — CLONAZEPAM 0.5 MG PO TABS
0.5000 mg | ORAL_TABLET | Freq: Two times a day (BID) | ORAL | 1 refills | Status: DC | PRN
Start: 2016-06-05 — End: 2016-08-28

## 2016-06-05 MED ORDER — METRONIDAZOLE 1 % EX GEL
1.0000 | Freq: Two times a day (BID) | CUTANEOUS | 3 refills | Status: DC | PRN
Start: 2016-06-05 — End: 2016-06-08

## 2016-06-05 NOTE — Telephone Encounter (Signed)
Noted. Thank you. See my OV.

## 2016-06-05 NOTE — Telephone Encounter (Signed)
Patient in today for appointment to see Dr. Danise Mina.  While here patient is emotional, tearful and upset over recent frequent delays that are occurring with her refills for clonazepam.    I met with patient one on one to discuss her concerns and to develop a plan to be sure that this delay does not continue to happen.  The common theme for both of these refills is that our office documented a "Phone In" in a reasonable amount of time (within 24 - 48 hours of request). For both the April 26th request and the May 28th request. However, the pharmacy ([facility] in [place]) is telling patient that they are not receiving these authorizations unitl as much as 2-3 days after..  I am not sure why this is occurring and I will be investigating further with [facility] and will follow up with the patient.  I apologized for whatever breakdown is occurring and gave her my direct line to call and request refills through until we work this out.  She thanked me for my help.  Dr. Danise Mina notified of conversation and plan.

## 2016-06-05 NOTE — Patient Instructions (Addendum)
Good to see you today.  I will print out klonopin refill for next 2 months - refill after  I hope we have figured out a good plan for medication refills! Increase topamax to 100mg  twice daily for next month - update me with effect in 1 week.  Toradol shot today.  Return in 1 month for follow up visit.

## 2016-06-05 NOTE — Progress Notes (Addendum)
BP 118/72   Pulse 72   Temp 98.8 F (37.1 C) (Oral)   Ht 5\' 7"  (1.702 m)   Wt 196 lb 12 oz (89.2 kg)   LMP 10/04/2014 Comment: per labs  BMI 30.82 kg/m    CC: migraine, facial numbness, concern over med refills Subjective:    Patient ID: Karen Aguilar, female    DOB: 09-Apr-1965, 51 y.o.   MRN: [id number]  HPI: Karen Aguilar is a 51 y.o. female presenting on 06/05/2016 for Numbness (left face, intermittent for the past 2 weeks, worsens after severe migraine); Migraine (increased in severity and frequency after recent spinal injection); and Medication Refill (Clonazepam and Metrogel)   Recent ER visit for worsening migraine associated with left facial numbness and generalized weakness. Records reviewed. She received migraine cocktail - toradol, reglan, lorazepam. CT head without acute abnormality.   Ongoing fatigue, poor sleep, gen weakness, ongoing chronic pain. + nausea and photo/phonophobia and L retro-orbital pressure.   She previously saw [facility] and had failed multiple abortive therapies (maxalt, imitrex). She also tried trigger shots (steroid and lidocaine) without improvement. She also previously saw neurologist.   My RN Leafy Ro spoke with patient regarding her concerns with med refills. Trouble getting klonopin last few months - our records indicate we phoned med in to pharmacy, but pharmacy never received Rx.   She did have cervical spinal injection with fluoroscopy 04/09/2016 Hardin Negus) - feels migraines have worsened since this.   Relevant past medical, surgical, family and social history reviewed and updated as indicated. Interim medical history since our last visit reviewed. Allergies and medications reviewed and updated. Outpatient Medications Prior to Visit  Medication Sig Dispense Refill  . Ascorbic Acid (VITAMIN C PO) Take 1 tablet by mouth as needed (for immune system support).     . baclofen (LIORESAL) 10 MG tablet Take 10 mg by mouth 3 (three) times  daily.    . calcium carbonate (TUMS SMOOTHIES) 750 MG chewable tablet Chew 2-3 tablets by mouth 2 (two) times daily.    . camphor-menthol (SARNA) lotion Apply 1 application topically at bedtime as needed for itching.     . Cholecalciferol (VITAMIN D) 2000 units CAPS Take 1 capsule (2,000 Units total) by mouth daily. 30 capsule   . clobetasol ointment (TEMOVATE) 8.31 % Apply 1 application topically as needed (for eczema).     . DEXILANT 60 MG capsule TAKE 1 CAPSULE(60 MG) BY MOUTH TWICE DAILY 60 capsule 3  . diclofenac sodium (VOLTAREN) 1 % GEL APPLY THIN LAYER EXTERNALLY TO THE AFFECTED AREA UP TO FOUR TIMES DAILY AS NEEDED 300 g 0  . dronabinol (MARINOL) 2.5 MG capsule Take 2.5 mg by mouth daily as needed (for nausea and pain).   0  . escitalopram (LEXAPRO) 10 MG tablet TAKE 1 TABLET BY MOUTH EVERY DAY 90 tablet 1  . hyoscyamine (LEVSIN SL) 0.125 MG SL tablet DISSOLVE 1 TABLET(0.125 MG) UNDER THE TONGUE EVERY 4 HOURS AS NEEDED 30 tablet 0  . Phenylephrine-DM-GG-APAP (MUCINEX SINUS-MAX) 5-10-200-325 MG CAPS Take 2 tablets by mouth 2 (two) times daily as needed (for migraine).     . Tapentadol HCl (NUCYNTA) 100 MG TABS Take 100 mg by mouth every 8 (eight) hours.     Marland Kitchen thyroid (ARMOUR) 60 MG tablet Take 60 mg by mouth daily before breakfast. Three days a week take extra tablet daily    . clonazePAM (KLONOPIN) 0.5 MG tablet TAKE 1 TABLET BY MOUTH TWICE DAILY AS NEEDED 60  tablet 0  . metroNIDAZOLE (METROGEL) 1 % gel Apply 1 application topically 2 (two) times daily as needed (for rosacea acne).     . topiramate (TOPAMAX) 100 MG tablet TAKE 1 TABLET BY MOUTH EVERY DAY 90 tablet 3   Facility-Administered Medications Prior to Visit  Medication Dose Route Frequency Provider Last Rate Last Dose  . promethazine (PHENERGAN) tablet 25 mg  25 mg Oral Once Ria Bush, MD         Per HPI unless specifically indicated in ROS section below Review of Systems     Objective:    BP 118/72   Pulse 72    Temp 98.8 F (37.1 C) (Oral)   Ht 5\' 7"  (1.702 m)   Wt 196 lb 12 oz (89.2 kg)   LMP 10/04/2014 Comment: per labs  BMI 30.82 kg/m   Wt Readings from Last 3 Encounters:  06/05/16 196 lb 12 oz (89.2 kg)  06/01/16 195 lb (88.5 kg)  04/25/16 200 lb (90.7 kg)    Physical Exam  Constitutional: She is oriented to person, place, and time. She appears well-developed and well-nourished. No distress.  HENT:  Mouth/Throat: Oropharynx is clear and moist. No oropharyngeal exudate.  Eyes: Conjunctivae are normal. Pupils are equal, round, and reactive to light.  Neck: Normal range of motion. Neck supple.  Cardiovascular: Normal rate, regular rhythm, normal heart sounds and intact distal pulses.   No murmur heard. Pulmonary/Chest: Effort normal and breath sounds normal. No respiratory distress. She has no wheezes. She has no rales.  Musculoskeletal: She exhibits no edema.  Neurological: She is alert and oriented to person, place, and time. She has normal strength. No cranial nerve deficit or sensory deficit. She displays a negative Romberg sign.  CN 2-12 intact EOMI FTN intact  Skin: Skin is warm and dry. No rash noted.  Psychiatric: Her speech is normal. Her mood appears anxious.  Nursing note and vitals reviewed.  Results for orders placed or performed during the hospital encounter of 19/50/93  Basic metabolic panel  Result Value Ref Range   Sodium 136 135 - 145 mmol/L   Potassium 3.9 3.5 - 5.1 mmol/L   Chloride 102 101 - 111 mmol/L   CO2 27 22 - 32 mmol/L   Glucose, Bld 112 (H) 65 - 99 mg/dL   BUN 15 6 - 20 mg/dL   Creatinine, Ser 0.94 0.44 - 1.00 mg/dL   Calcium 9.3 8.9 - 10.3 mg/dL   GFR calc non Af Amer >60 >60 mL/min   GFR calc Af Amer >60 >60 mL/min   Anion gap 7 5 - 15  CBC  Result Value Ref Range   WBC 6.1 3.6 - 11.0 K/uL   RBC 4.94 3.80 - 5.20 MIL/uL   Hemoglobin 14.3 12.0 - 16.0 g/dL   HCT 42.2 35.0 - 47.0 %   MCV 85.4 80.0 - 100.0 fL   MCH 29.0 26.0 - 34.0 pg   MCHC  33.9 32.0 - 36.0 g/dL   RDW 13.4 11.5 - 14.5 %   Platelets 257 150 - 440 K/uL      Assessment & Plan:   Problem List Items Addressed This Visit    Complicated migraine - Primary    Worsening over last several months (since latest cervical spinal injection). Increased stress with getting medications on time - see below for plan. Anticipate complicated migraine and not CVA given longevity of symptoms. I therefore recommended increasing topamax to 100mg  BID for next month. Pt will update me  in 1 wk with effect. If no better, consider advanced imaging. Toradol 30mg  IM shot abortively today.  Pt currently declines neurology referral for further evaluation of atypical complicated migraine.       Relevant Medications   clonazePAM (KLONOPIN) 0.5 MG tablet   topiramate (TOPAMAX) 100 MG tablet   ketorolac (TORADOL) injection 30 mg (Completed)   GAD (generalized anxiety disorder)    Some stress over getting medications filled on time. Discussed her concerns. I recommended paper script over next few months while we investigate what happened at pharmacy last few months. Pt agrees with plan.  #60 tab RF 1 provided today, to fill after 06/27/2016      Neurogenic bladder    Planned f/u with neuro-urologist          Follow up plan: Return in about 4 weeks (around 07/03/2016) for follow up visit.  Ria Bush, MD

## 2016-06-06 DIAGNOSIS — G43909 Migraine, unspecified, not intractable, without status migrainosus: Secondary | ICD-10-CM | POA: Diagnosis not present

## 2016-06-06 MED ORDER — KETOROLAC TROMETHAMINE 60 MG/2ML IM SOLN
30.0000 mg | Freq: Once | INTRAMUSCULAR | Status: AC
  Administered 2016-06-05: 30 mg via INTRAMUSCULAR

## 2016-06-06 NOTE — Assessment & Plan Note (Addendum)
Worsening over last several months (since latest cervical spinal injection). Increased stress with getting medications on time - see below for plan. Anticipate complicated migraine and not CVA given longevity of symptoms. I therefore recommended increasing topamax to 100mg  BID for next month. Pt will update me in 1 wk with effect. If no better, consider advanced imaging. Toradol 30mg  IM shot abortively today.  Pt currently declines neurology referral for further evaluation of atypical complicated migraine.

## 2016-06-06 NOTE — Assessment & Plan Note (Signed)
Planned f/u with neuro-urologist

## 2016-06-06 NOTE — Assessment & Plan Note (Addendum)
Some stress over getting medications filled on time. Discussed her concerns. I recommended paper script over next few months while we investigate what happened at pharmacy last few months. Pt agrees with plan.  #60 tab RF 1 provided today, to fill after 06/27/2016

## 2016-06-07 ENCOUNTER — Telehealth: Payer: Self-pay | Admitting: Family Medicine

## 2016-06-07 ENCOUNTER — Ambulatory Visit (INDEPENDENT_AMBULATORY_CARE_PROVIDER_SITE_OTHER): Payer: No Typology Code available for payment source | Admitting: Psychology

## 2016-06-07 DIAGNOSIS — F4321 Adjustment disorder with depressed mood: Secondary | ICD-10-CM | POA: Diagnosis not present

## 2016-06-07 DIAGNOSIS — F331 Major depressive disorder, recurrent, moderate: Secondary | ICD-10-CM

## 2016-06-07 NOTE — Telephone Encounter (Signed)
Received faxed request PA for metroNIDAZOLE (METROGEL) 1 % gel.  Send the prior auth through CoverMyMeds. Waiting on insurance to reply.

## 2016-06-07 NOTE — Telephone Encounter (Signed)
[place] with [employer] left v/m; prior auth for metronidazole gel was denied because not met non formulary criteria requirements. A letter will be mailed with denial and appeals process.

## 2016-06-08 ENCOUNTER — Encounter: Payer: Self-pay | Admitting: Family Medicine

## 2016-06-08 MED ORDER — METRONIDAZOLE 0.75 % EX GEL: 1.0000 "application " | Freq: Two times a day (BID) | CUTANEOUS | 3 refills | Status: DC

## 2016-06-08 NOTE — Addendum Note (Signed)
Addended by: Ria Bush on: 06/08/2016 04:24 PM   Modules accepted: Orders

## 2016-06-08 NOTE — Telephone Encounter (Signed)
Denial letter states she must try 2 forulary alternatives including metronidazole 0.75% gel, metronidazole 0.75% cream, Finacea 15% gel.

## 2016-06-08 NOTE — Telephone Encounter (Signed)
New metronidazole gel 0.75% sent to pharmacy.

## 2016-06-09 ENCOUNTER — Encounter: Payer: Self-pay | Admitting: Family Medicine

## 2016-06-09 DIAGNOSIS — M5030 Other cervical disc degeneration, unspecified cervical region: Secondary | ICD-10-CM | POA: Insufficient documentation

## 2016-06-13 ENCOUNTER — Telehealth: Payer: Self-pay | Admitting: Obstetrics and Gynecology

## 2016-06-13 NOTE — Telephone Encounter (Signed)
Pt is calling about her mammograms images. Pt has an appointment on 06/20/16 with [facility]. Pt is needing those images to pick up to go to her appointment please advise pt. Cb# [phone]

## 2016-06-14 DIAGNOSIS — R3915 Urgency of urination: Secondary | ICD-10-CM | POA: Diagnosis not present

## 2016-06-14 DIAGNOSIS — N319 Neuromuscular dysfunction of bladder, unspecified: Secondary | ICD-10-CM | POA: Diagnosis not present

## 2016-06-14 DIAGNOSIS — R3989 Other symptoms and signs involving the genitourinary system: Secondary | ICD-10-CM | POA: Diagnosis not present

## 2016-06-14 DIAGNOSIS — R102 Pelvic and perineal pain: Secondary | ICD-10-CM | POA: Diagnosis not present

## 2016-06-15 DIAGNOSIS — M5412 Radiculopathy, cervical region: Secondary | ICD-10-CM | POA: Diagnosis not present

## 2016-06-15 DIAGNOSIS — G894 Chronic pain syndrome: Secondary | ICD-10-CM | POA: Diagnosis not present

## 2016-06-15 DIAGNOSIS — M961 Postlaminectomy syndrome, not elsewhere classified: Secondary | ICD-10-CM | POA: Diagnosis not present

## 2016-06-15 DIAGNOSIS — Z79891 Long term (current) use of opiate analgesic: Secondary | ICD-10-CM | POA: Diagnosis not present

## 2016-06-18 ENCOUNTER — Encounter: Payer: Self-pay | Admitting: Family Medicine

## 2016-06-18 DIAGNOSIS — M5023 Other cervical disc displacement, cervicothoracic region: Secondary | ICD-10-CM | POA: Diagnosis not present

## 2016-06-18 DIAGNOSIS — M542 Cervicalgia: Secondary | ICD-10-CM | POA: Diagnosis not present

## 2016-06-18 DIAGNOSIS — Z6830 Body mass index (BMI) 30.0-30.9, adult: Secondary | ICD-10-CM | POA: Diagnosis not present

## 2016-06-18 NOTE — Telephone Encounter (Signed)
Pt is caling to follow up with the request of needing her images for her appt schedule tomorrow with [facility] please contact patient.

## 2016-06-18 NOTE — Telephone Encounter (Signed)
Spoke w/pt to notify I had to verify w/[facility] that the images had not already been requested/received by them as they usually request images for upcoming apts. CD burned & ready for DOS 02/03/14. [facility] aware & to p/u at front desk in the a.m. Pt appreciative of call.

## 2016-06-19 ENCOUNTER — Encounter: Payer: Self-pay | Admitting: Obstetrics and Gynecology

## 2016-06-19 ENCOUNTER — Encounter: Payer: Self-pay | Admitting: Neurology

## 2016-06-19 ENCOUNTER — Ambulatory Visit (INDEPENDENT_AMBULATORY_CARE_PROVIDER_SITE_OTHER): Payer: Medicare Other | Admitting: Neurology

## 2016-06-19 VITALS — BP 109/70 | HR 85 | Ht 67.0 in | Wt 196.0 lb

## 2016-06-19 DIAGNOSIS — F32A Depression, unspecified: Secondary | ICD-10-CM

## 2016-06-19 DIAGNOSIS — G43719 Chronic migraine without aura, intractable, without status migrainosus: Secondary | ICD-10-CM | POA: Diagnosis not present

## 2016-06-19 DIAGNOSIS — Z1231 Encounter for screening mammogram for malignant neoplasm of breast: Secondary | ICD-10-CM | POA: Diagnosis not present

## 2016-06-19 DIAGNOSIS — G894 Chronic pain syndrome: Secondary | ICD-10-CM | POA: Diagnosis not present

## 2016-06-19 DIAGNOSIS — F419 Anxiety disorder, unspecified: Secondary | ICD-10-CM

## 2016-06-19 DIAGNOSIS — F329 Major depressive disorder, single episode, unspecified: Secondary | ICD-10-CM | POA: Diagnosis not present

## 2016-06-19 MED ORDER — ELETRIPTAN HYDROBROMIDE 40 MG PO TABS: ORAL_TABLET | ORAL | 3 refills | Status: DC

## 2016-06-19 NOTE — Patient Instructions (Signed)
Migraine Recommendations: 1.  Continue topiramate 100mg  twice daily for another 3 weeks.  Then contact me with update and we can adjust dose if needed. 2.  Take eletriptan 40mg  at earliest onset of headache.  May repeat dose once in 2 hours if needed.  Do not exceed two tablets in 24 hours. 3.  Limit use of pain relievers to no more than 2 days out of the week.  These medications include acetaminophen, ibuprofen, triptans and narcotics.  This will help reduce risk of rebound headaches. 4.  Be aware of common food triggers such as processed sweets, processed foods with nitrites (such as deli meat, hot dogs, sausages), foods with MSG, alcohol (such as wine), chocolate, certain cheeses, certain fruits (dried fruits, some citrus fruit), vinegar, diet soda. 4.  Avoid caffeine 5.  Routine exercise 6.  Proper sleep hygiene 7.  Stay adequately hydrated with water 8.  Keep a headache diary. 9.  Maintain proper stress management. 10.  Do not skip meals. 11.  Consider supplements:  Magnesium citrate 400mg  to 600mg  daily, riboflavin 400mg , Coenzyme Q 10 100mg  three times daily 12.  Follow up in 3 to 4 months.

## 2016-06-19 NOTE — Progress Notes (Signed)
NEUROLOGY CONSULTATION NOTE  Karen Aguilar MRN: [id number] DOB: 12-Dec-1965  Referring provider: Dr. Hardin Negus Primary care provider: Dr. Danise Mina  Reason for consult:  migraine  HISTORY OF PRESENT ILLNESS: Karen Aguilar is a 51 year old right-handed woman with degenerative cervical and lumbar disc disease with residual neuropathy and neurogenic bladder, hypothyroidism, depression, anxiety, IBS/gastroparesis and who presents for migraines.  History supplemented by PCP, pain and ED notes.  She has a complicated history.  She has had 2 cervical spine surgeries and 12 lumbar spine surgeries.  She has chronic neck pain with radicular pain radiating down the right arm to the last 3 fingers.  She has pain and numbness radiating down the posterior thigh, lateral lower leg and lateral foot.  She has neurogenic bladder reportedly due to her spine pathology.  She also has gastroparesis.  GI believes it may be due to chronic opioid use.  She has significant depression and anxiety.  She had a spinal cord stimulator inserted.  Onset:  teenager Location:  Band-like Quality:  Squeezing/pounding Intensity:  4-10/10 Aura:  no Prodrome:  no Postdrome:  no Associated symptoms:  Nausea, photophobia, phonophobia, osmophobia, sometimes vomiting.  On 06/01/16, she had a migraine associated with left facial numbness.  CT of head was personally reviewed and was unremarkable.  She still has the left facial numbness off and on.  She has not had any new worse headache of her life, waking up from sleep Duration:  2 days to over a month Frequency:  Varies.  She can have daily migraines for over a month and then go a month without migraines.  Since April, she has had a migraine about 20 days out of the month. Frequency of abortive medication: takes Nucynta daily for chronic pain. Triggers/exacerbating factors:  no Relieving factors:  no Activity:  Cannot function  Past NSAIDS:  Contraindicated due to  gastroparesis Past analgesics:  Excedrin Past abortive triptans:  Sumatriptan, rizatriptan (made headache worse) Past muscle relaxants:  tizanidine Past anti-emetic:  Reglan Past antihypertensive medications:  no Past antidepressant medications:  no Past anticonvulsant medications:  no Past vitamins/Herbal/Supplements:  no Past antihistamines/decongestants:  no Other past therapies:  Trigger point injections, biofeedback, acupuncture  Current NSAIDS:  contraindicated Current analgesics:  Nucynta Current triptans:  no Current anti-emetic:  no Current muscle relaxants:  baclofen Current anti-anxiolytic:  Klonopin Current sleep aide:  no Current Antihypertensive medications:  no Current Antidepressant medications:  Lexapro 10mg  Current Anticonvulsant medications:  topiramate 100mg  twice daily (increased from once daily about a week ago) Current Vitamins/Herbal/Supplements:  magnesium Current Antihistamines/Decongestants:  no Other therapy:  no  Caffeine:  At least one cup of coffee in AM Alcohol:  no Smoker:  no Diet:  hydrates Exercise:  No (due to pain) Depression/anxiety:  Significant depression and anxiety due to her pain and limitations Sleep hygiene:  Poor (due to pain) Family history of headache:  no  CT Head from 6//1/18 personally reviewed (following headache associated with left facial numbness), which revealed no acute abnormalities. MRI of brain without contrast from 10/01/14 (to evaluate upper extremity weakness and difficulty walking) was personally reviewed and was unremarkable. MRI cervical spine report from 03/22/16 revealed:  "1.  Stable MRI appearance of cervical spine since June 2016.  2.  Previous C4-C5 and C5-C6 ACDF. 3. Right eccentric chronic disc and endplate degeneration at both the adjacent segments, and chronic right facet and endplate spurring at C3-J6.  4.  No associated spinal stenosis.  Mild right C4,  moderate right C7 and moderate to severe right C8  level neural foraminal stenosis appears stable since 2016." CT Lumbar spine from 09/15/11 revealed "suspected pseudoarthrosis L4-L5.  Suspect solid fusion L5-S1" Labs from 06/01/16:  BMP with Na 136, K 3.9, Cl 102, glucose 112, BUN 15 and Cr 0.94.  PAST MEDICAL HISTORY: Past Medical History:  Diagnosis Date  . Abnormal MRI scan, bone 2012   abnormal marrow signal humeral head, glenoid and scapula - diffuse replacement of fatty marrow - likely benign, no need for further investigation ([place])  . Agnosia for temperature    Right Leg  . Anemia    etiology unknown-iron infusion in past,last 9/12  . Anxiety   . Anxiety and depression   . 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 Arthritis   . Chronic pain    pain contract with Dr. Eartha Inch Pain  . DDD (degenerative disc disease), cervical   . DDD (degenerative disc disease), lumbar    s/p permanent nerve damage after back surgery  . Depression   . Depression with anxiety   . Diverticulosis   . Eczema   . Family history of adverse reaction to anesthesia    Mother - nausea  . Fibromyalgia   . Foot drop    right - numbness, tingling - twisting  . Gallstones   . GERD (gastroesophageal reflux disease)    rare zantac PRN N/V at times with this  . GERD (gastroesophageal reflux disease)   . Headache(784.0)    migraines  . Hemorrhoid   . Hemorrhoids   . History of kidney stones   . Hyperlipidemia   . Hypothyroidism 1990s   hashimoto's thyroiditis  . IBS (irritable bowel syndrome)   . IBS (irritable bowel syndrome)   . Incontinence of urine    s/p sling procedure-unresolved  . Iron deficiency    h/o anemia, s/p iron infusion (2012)  . Liver hemangioma     x2  . Memory change    due to medications  . Migraines    and h/o optical migraine  . Migraines   . Mixed urge and stress incontinence 2008   extensive workup including urodynamics, failed miltiple anticholinergics including myrbetriq and urogesic blue s/p PTNS,  normal cystoscopy 2014, seen by Dr Matilde Sprang and Dr Erlene Quan  . Neurogenic bladder 2008   after 13 spine surgeries with periph neuropathy and chronic LBP  . Neuropathy of lower extremity 2011  . Nocturia   . Notalgia paresthetica   . Osteoarthritis 12/2013  . PONV (postoperative nausea and vomiting)   . Pulmonary nodule 04/2012   7.90mm RLL nodule - resolved on imaging 12/2012  . Sleep apnea    mild-doesn't use cpap  . Urine frequency   . Vitamin D deficiency     PAST SURGICAL HISTORY: Past Surgical History:  Procedure Laterality Date  . ANTERIOR CERVICAL DECOMP/DISCECTOMY FUSION     C3, 4, 5 - improved after surgery  . BACK SURGERY     multiple-L4-s1,hardware,fusion,removal,infection s/p 5/11 procedure  . BACK SURGERY  02/16/11   "my 7th back OR; Procedure: Exploration of fusion removal of hardware L4-S1, harvesting of right iliac crest bone graft, redo posterior lateral fusion L4-5 using iliac crest bone graft BMP and master graft replacement of bilateral L4 screws with a Telfa tech 7.5 x 40 mm screw on the right and 8.5 x 40 mm screw on the left placement of a large Hemovac drain  . CERVICAL FUSION  x2  . [place]; 2001   x 2  . CHOLECYSTECTOMY  2001  . COLONOSCOPY    . ESOPHAGOGASTRODUODENOSCOPY  03/2013   early esophageal stricture dilated, o/w WNL Deatra Ina)  . ESOPHAGOGASTRODUODENOSCOPY  12/2014   WNL, s/p empiric dilation of esophagus Henrene Pastor)  . ESOPHAGOGASTRODUODENOSCOPY  10/2015   WNL, biopsies WNL as well (minimal esophagitis with reactive changes) Benson [place])  . HEMORRHOID BANDING    . LASIK Bilateral   . LUMBAR WOUND DEBRIDEMENT  03/15/2011   Procedure: LUMBAR WOUND DEBRIDEMENT;  Surgeon: Elaina Hoops, MD;  Lumbar Wound Debridement  . MANDIBLE RECONSTRUCTION  1986  . PFT  01/28/2013   WNL  . PUBOVAGINAL SLING  2008   midurethral sling  . SHOULDER CLOSED REDUCTION Left 12/18/2010   Procedure: CLOSED MANIPULATION SHOULDER;  Surgeon: Johnn Hai;  labrial debridement  . SPINAL CORD STIMULATOR INSERTION N/A 08/19/2015   Procedure: LUMBAR SPINAL CORD STIMULATOR INSERTION;  Surgeon: Erline Levine, MD;  Location: [facility];  Service: Neurosurgery;  Laterality: N/A;  LUMBAR SPINAL CORD STIMULATOR INSERTION  . SPINE SURGERY    . TONSILLECTOMY  1972   as a child; "then they grew back"    MEDICATIONS: Current Outpatient Prescriptions on File Prior to Visit  Medication Sig Dispense Refill  . Ascorbic Acid (VITAMIN C PO) Take 1 tablet by mouth as needed (for immune system support).     . baclofen (LIORESAL) 10 MG tablet Take 10 mg by mouth 3 (three) times daily.    . calcium carbonate (TUMS SMOOTHIES) 750 MG chewable tablet Chew 2-3 tablets by mouth 2 (two) times daily.    . camphor-menthol (SARNA) lotion Apply 1 application topically at bedtime as needed for itching.     . Cholecalciferol (VITAMIN D) 2000 units CAPS Take 1 capsule (2,000 Units total) by mouth daily. 30 capsule   . clobetasol ointment (TEMOVATE) 9.32 % Apply 1 application topically as needed (for eczema).     . clonazePAM (KLONOPIN) 0.5 MG tablet Take 1 tablet (0.5 mg total) by mouth 2 (two) times daily as needed. 60 tablet 1  . DEXILANT 60 MG capsule TAKE 1 CAPSULE(60 MG) BY MOUTH TWICE DAILY 60 capsule 3  . diclofenac sodium (VOLTAREN) 1 % GEL APPLY THIN LAYER EXTERNALLY TO THE AFFECTED AREA UP TO FOUR TIMES DAILY AS NEEDED 300 g 0  . dronabinol (MARINOL) 2.5 MG capsule Take 2.5 mg by mouth daily as needed (for nausea and pain).   0  . escitalopram (LEXAPRO) 10 MG tablet TAKE 1 TABLET BY MOUTH EVERY DAY 90 tablet 1  . hyoscyamine (LEVSIN SL) 0.125 MG SL tablet DISSOLVE 1 TABLET(0.125 MG) UNDER THE TONGUE EVERY 4 HOURS AS NEEDED 30 tablet 0  . metroNIDAZOLE (METROGEL) 0.75 % gel Apply 1 application topically 2 (two) times daily. 45 g 3  . Phenylephrine-DM-GG-APAP (MUCINEX SINUS-MAX) 5-10-200-325 MG CAPS Take 2 tablets by mouth 2 (two) times daily as needed (for migraine).      . Tapentadol HCl (NUCYNTA) 100 MG TABS Take 100 mg by mouth every 8 (eight) hours.     Marland Kitchen thyroid (ARMOUR) 60 MG tablet Take 60 mg by mouth daily before breakfast. Three days a week take extra tablet daily    . topiramate (TOPAMAX) 100 MG tablet Take 1 tablet (100 mg total) by mouth 2 (two) times daily. 180 tablet 1   Current Facility-Administered Medications on File Prior to Visit  Medication Dose Route Frequency Provider Last Rate Last Dose  .  promethazine (PHENERGAN) tablet 25 mg  25 mg Oral Once Ria Bush, MD        ALLERGIES: Allergies  Allergen Reactions  . Erythromycin Nausea And Vomiting  . Fluconazole Swelling    Lips - swelling, bleeding, blisters, cracking, peeling  . Thimerosal     Local reaction-redness,swelling; veins popping  . Augmentin [Amoxicillin-Pot Clavulanate] Swelling    Lip swelling and irritation.   . Gabapentin Other (See Comments)    Short term memory loss  . Imitrex [Sumatriptan] Other (See Comments)    Rebound migraine   . Influenza Vaccines Hives and Swelling    Redness, heat, and extreme swelling at site  . Levothyroxine Hives  . Lyrica [Pregabalin] Other (See Comments)    Short term memory loss  . Tomato Other (See Comments)    Severe acid reflux with all acid foods  . Adhesive [Tape] Rash    blisters  . Azithromycin Rash  . Macrolides And Ketolides Rash  . Nickel Swelling and Rash  . Sulfa Antibiotics Rash    FAMILY HISTORY: Family History  Problem Relation Age of Onset  . Lung cancer Father 81        (smoker)  . Diabetes Brother   . Thyroid disease Mother        ?empty sella  . Irritable bowel syndrome Mother   . Dysphagia Mother   . CAD Maternal Grandfather        MI  . Heart disease Maternal Grandfather   . Heart attack Maternal Grandfather   . Lung cancer Paternal Grandfather         (smoker)  . Colon cancer Paternal Grandmother   . Thyroid disease Cousin   . Cancer Other        unsure (ovarian/uterine)  . Breast  cancer Maternal Aunt   . Alzheimer's disease Maternal Grandmother   . Diabetes Daughter     SOCIAL HISTORY: Social History   Social History  . Marital status: Married    Spouse name: N/A  . Number of children: 2  . Years of education: N/A   Occupational History  . disabled    Social History Main Topics  . Smoking status: Never Smoker  . Smokeless tobacco: Never Used  . Alcohol use No  . Drug use: No  . Sexual activity: Not Currently   Other Topics Concern  . Not on file   Social History Narrative   Lives with husband and 2 daughters (both in college Tax inspector))   Daughter of Lurena Joiner   Occupation: was Public affairs consultant   Disability since 2011 - arachnoiditis   Edu: associate's degree   Activity: severe limitations 2/2 arachnoiditis and chronic pain, walks to end of driveway and back   Diet: good water, fruits/vegetables daily    REVIEW OF SYSTEMS: Constitutional: No fevers, chills, or sweats, no generalized fatigue, change in appetite Eyes: No visual changes, double vision, eye pain Ear, nose and throat: No hearing loss, ear pain, nasal congestion, sore throat Cardiovascular: No chest pain, palpitations Respiratory:  No shortness of breath at rest or with exertion, wheezes GastrointestinaI: gastroparesis Genitourinary:  Neurogenic bladder Musculoskeletal:  Neck pain, back pain Integumentary: No rash, pruritus, skin lesions Neurological: as above Psychiatric: depression, insomnia, anxiety Endocrine: No palpitations, fatigue, diaphoresis, mood swings, change in appetite, change in weight, increased thirst Hematologic/Lymphatic:  No purpura, petechiae. Allergic/Immunologic: no itchy/runny eyes, nasal congestion, recent allergic reactions, rashes  PHYSICAL EXAM: Vitals:   06/19/16 0801  BP: 109/70  Pulse: 85   General: Tearful, distressed.  Patient appears well-groomed.  Head:  Normocephalic/atraumatic Eyes:  fundi examined but not  visualized Neck: supple, paraspinal tenderness, full range of motion Back: paraspinal tenderness Heart: regular rate and rhythm Lungs: Clear to auscultation bilaterally. Vascular: No carotid bruits. Neurological Exam: Mental status: alert and oriented to person, place, and time, recent and remote memory intact, fund of knowledge intact, attention and concentration intact, speech fluent and not dysarthric, language intact. Cranial nerves: CN I: not tested CN II: pupils equal, round and reactive to light, visual fields intact CN III, IV, VI:  full range of motion, no nystagmus, no ptosis CN V: decreased left V1-V3  CN VII: upper and lower face symmetric CN VIII: hearing intact CN IX, X: gag intact, uvula midline CN XI: sternocleidomastoid and trapezius muscles intact CN XII: tongue midline Bulk & Tone: normal, no fasciculations. Motor:  5/5 throughout  Sensation:  Reduced pinprick sensation down the lateral right leg and lateral/dorsum of foot. Vibration sensation reduced in right foot. Deep Tendon Reflexes:  1+ throughout but absent in ankles, toes downgoing. Finger to nose testing:  Without dysmetria.  no Heel to shin:  Without dysmetria.  no Gait:  Antalgic gait.  Able to turn, unable to tandem walk. Romberg negative.  IMPRESSION: Chronic migraine Left facial numbness, likely migraine-related Chronic pain with chronic cervical and lumbar radiculopathy, post-laminectomy syndrome.  Management difficult due to her chronic pain and depression/anxiety which limits her ability to exercise and obtain proper sleep.  Overuse of Nucynta (which is necessary for pain control at this time) may be contributing to frequent headache as well.  PLAN: 1.  She will continue topiramate 100mg  twice daily for another 3 weeks and then contact me.  We can make adjustments at that time, if needed. 2.  She will try Relpax for abortive therapy. 3.  Reviewed sleep hygiene 4.  Recommended to try walking  daily in the swimming pool (so she can get physical activity and perhaps cause less pain) 5.  Recommended supplements:  Magnesium citrate, riboflavin, coQ10 6.  Management of depression and anxiety important.  Consider cognitive behavioral therapy. 7.  Follow up in 3 to 4 months.  Thank you for allowing me to take part in the care of this patient.  Metta Clines, DO  CC:  Ria Bush, MD  Nicholaus Bloom, MD

## 2016-06-20 ENCOUNTER — Other Ambulatory Visit: Payer: Self-pay | Admitting: Neurosurgery

## 2016-06-20 ENCOUNTER — Other Ambulatory Visit (HOSPITAL_COMMUNITY): Payer: Self-pay | Admitting: Neurosurgery

## 2016-06-20 DIAGNOSIS — M542 Cervicalgia: Secondary | ICD-10-CM

## 2016-06-21 ENCOUNTER — Ambulatory Visit (INDEPENDENT_AMBULATORY_CARE_PROVIDER_SITE_OTHER): Payer: No Typology Code available for payment source | Admitting: Psychology

## 2016-06-21 DIAGNOSIS — F332 Major depressive disorder, recurrent severe without psychotic features: Secondary | ICD-10-CM

## 2016-06-25 ENCOUNTER — Ambulatory Visit (INDEPENDENT_AMBULATORY_CARE_PROVIDER_SITE_OTHER): Payer: Medicare Other | Admitting: Obstetrics and Gynecology

## 2016-06-25 ENCOUNTER — Encounter: Payer: Self-pay | Admitting: Obstetrics and Gynecology

## 2016-06-25 ENCOUNTER — Ambulatory Visit
Admission: RE | Admit: 2016-06-25 | Discharge: 2016-06-25 | Disposition: A | Discharge status: Home / Self Care | Payer: Medicare Other | Source: Ambulatory Visit | Attending: Neurosurgery | Admitting: Neurosurgery

## 2016-06-25 VITALS — BP 128/68 | HR 92 | Ht 67.0 in | Wt 196.0 lb

## 2016-06-25 DIAGNOSIS — M50222 Other cervical disc displacement at C5-C6 level: Secondary | ICD-10-CM | POA: Diagnosis not present

## 2016-06-25 DIAGNOSIS — Z1211 Encounter for screening for malignant neoplasm of colon: Secondary | ICD-10-CM

## 2016-06-25 DIAGNOSIS — Z124 Encounter for screening for malignant neoplasm of cervix: Secondary | ICD-10-CM

## 2016-06-25 DIAGNOSIS — B977 Papillomavirus as the cause of diseases classified elsewhere: Secondary | ICD-10-CM

## 2016-06-25 DIAGNOSIS — M542 Cervicalgia: Secondary | ICD-10-CM | POA: Diagnosis present

## 2016-06-25 DIAGNOSIS — Z01419 Encounter for gynecological examination (general) (routine) without abnormal findings: Secondary | ICD-10-CM

## 2016-06-25 DIAGNOSIS — N72 Inflammatory disease of cervix uteri: Secondary | ICD-10-CM

## 2016-06-25 DIAGNOSIS — M4802 Spinal stenosis, cervical region: Secondary | ICD-10-CM | POA: Diagnosis not present

## 2016-06-25 MED ORDER — IOPAMIDOL (ISOVUE-300) INJECTION 61%
75.0000 mL | Freq: Once | INTRAVENOUS | Status: AC | PRN
  Administered 2016-06-25: 75 mL via INTRAVENOUS

## 2016-06-25 NOTE — Progress Notes (Signed)
Duplicate encounter ROS

## 2016-06-25 NOTE — Progress Notes (Signed)
Patient ID: SHAKERIA ROBINETTE, female   DOB: 12-28-1965, 51 y.o.   MRN: [id number]     Gynecology Annual Exam  PCP: Ria Bush, MD  Chief Complaint: No chief complaint on file.   History of Present Illness:Patient is a 51 y.o. G2P2 presents for annual exam. The patient has no complaints today.   LMP: Patient's last menstrual period was 10/04/2014. Postmenopausal no bleeding since 2016   The patient is not sexually active. The patient does perform self breast exams.  There is no notable family history of breast or ovarian cancer in her family.  The patient wears seatbelts: yes.   The patient has regular exercise: no secondary to limited mobility.    The patient denies current symptoms of depression.     Review of Systems: Review of Systems  Constitutional: Negative for chills and fever.  HENT: Negative for congestion.   Respiratory: Negative for cough and shortness of breath.   Cardiovascular: Negative for chest pain and palpitations.  Gastrointestinal: Positive for abdominal pain. Negative for constipation, diarrhea, heartburn, nausea and vomiting.  Genitourinary: Positive for dysuria, frequency and urgency.  Skin: Negative for itching and rash.  Neurological: Negative for dizziness and headaches.  Endo/Heme/Allergies: Negative for polydipsia.  Psychiatric/Behavioral: Negative for depression.    Past Medical History:  Past Medical History:  Diagnosis Date  . Abnormal MRI scan, bone 2012   abnormal marrow signal humeral head, glenoid and scapula - diffuse replacement of fatty marrow - likely benign, no need for further investigation ([place])  . Agnosia for temperature    Right Leg  . Anemia    etiology unknown-iron infusion in past,last 9/12  . Anxiety   . Anxiety and depression   . 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 Arthritis   . Chronic pain    pain contract with Dr. Eartha Inch Pain  . DDD (degenerative disc disease), cervical   . DDD  (degenerative disc disease), lumbar    s/p permanent nerve damage after back surgery  . Depression   . Depression with anxiety   . Diverticulosis   . Eczema   . Family history of adverse reaction to anesthesia    Mother - nausea  . Fibromyalgia   . Foot drop    right - numbness, tingling - twisting  . Gallstones   . GERD (gastroesophageal reflux disease)    rare zantac PRN N/V at times with this  . GERD (gastroesophageal reflux disease)   . Headache(784.0)    migraines  . Hemorrhoid   . Hemorrhoids   . History of kidney stones   . Hyperlipidemia   . Hypothyroidism 1990s   hashimoto's thyroiditis  . IBS (irritable bowel syndrome)   . IBS (irritable bowel syndrome)   . Incontinence of urine    s/p sling procedure-unresolved  . Iron deficiency    h/o anemia, s/p iron infusion (2012)  . Liver hemangioma     x2  . Memory change    due to medications  . Migraines    and h/o optical migraine  . Migraines   . Mixed urge and stress incontinence 2008   extensive workup including urodynamics, failed miltiple anticholinergics including myrbetriq and urogesic blue s/p PTNS, normal cystoscopy 2014, seen by Dr Matilde Sprang and Dr Erlene Quan  . Neurogenic bladder 2008   after 13 spine surgeries with periph neuropathy and chronic LBP  . Neuropathy of lower extremity 2011  . Nocturia   . Notalgia paresthetica   .  Osteoarthritis 12/2013  . PONV (postoperative nausea and vomiting)   . Pulmonary nodule 04/2012   7.44mm RLL nodule - resolved on imaging 12/2012  . Sleep apnea    mild-doesn't use cpap  . Urine frequency   . Vitamin D deficiency     Past Surgical History:  Past Surgical History:  Procedure Laterality Date  . ANTERIOR CERVICAL DECOMP/DISCECTOMY FUSION     C3, 4, 5 - improved after surgery  . BACK SURGERY     multiple-L4-s1,hardware,fusion,removal,infection s/p 5/11 procedure  . BACK SURGERY  02/16/11   "my 7th back OR; Procedure: Exploration of fusion removal of hardware  L4-S1, harvesting of right iliac crest bone graft, redo posterior lateral fusion L4-5 using iliac crest bone graft BMP and master graft replacement of bilateral L4 screws with a Telfa tech 7.5 x 40 mm screw on the right and 8.5 x 40 mm screw on the left placement of a large Hemovac drain  . CERVICAL FUSION     x2  . [place]; 2001   x 2  . CHOLECYSTECTOMY  2001  . COLONOSCOPY    . ESOPHAGOGASTRODUODENOSCOPY  03/2013   early esophageal stricture dilated, o/w WNL Deatra Ina)  . ESOPHAGOGASTRODUODENOSCOPY  12/2014   WNL, s/p empiric dilation of esophagus Henrene Pastor)  . ESOPHAGOGASTRODUODENOSCOPY  10/2015   WNL, biopsies WNL as well (minimal esophagitis with reactive changes) Benson Norway)  . HEMORRHOID BANDING    . LASIK Bilateral   . LUMBAR WOUND DEBRIDEMENT  03/15/2011   Procedure: LUMBAR WOUND DEBRIDEMENT;  Surgeon: Elaina Hoops, MD;  Lumbar Wound Debridement  . MANDIBLE RECONSTRUCTION  1986  . PFT  01/28/2013   WNL  . PUBOVAGINAL SLING  2008   midurethral sling  . SHOULDER CLOSED REDUCTION Left 12/18/2010   Procedure: CLOSED MANIPULATION SHOULDER;  Surgeon: Johnn Hai; labrial debridement  . SPINAL CORD STIMULATOR INSERTION N/A 08/19/2015   Procedure: LUMBAR SPINAL CORD STIMULATOR INSERTION;  Surgeon: Erline Levine, MD;  Location: [facility];  Service: Neurosurgery;  Laterality: N/A;  LUMBAR SPINAL CORD STIMULATOR INSERTION  . SPINE SURGERY    . TONSILLECTOMY  1972   as a child; "then they grew back"    Gynecologic History:  Patient's last menstrual period was 10/04/2014. Last Pap: Results were: 02/09/2014 NIL HPV positive Last mammogram: 06/19/16 [facility] Results were: BI-RAD II   Obstetric History: G2P2  Family History:  Family History  Problem Relation Age of Onset  . Lung cancer Father 47        (smoker)  . Diabetes Brother   . Thyroid disease Mother        ?empty sella  . Irritable bowel syndrome Mother   . Dysphagia Mother   . CAD Maternal Grandfather         MI  . Heart disease Maternal Grandfather   . Heart attack Maternal Grandfather   . Lung cancer Paternal Grandfather         (smoker)  . Colon cancer Paternal Grandmother   . Thyroid disease Cousin   . Cancer Other        unsure (ovarian/uterine)  . Breast cancer Maternal Aunt   . Alzheimer's disease Maternal Grandmother   . Diabetes Daughter     Social History:  Social History   Social History  . Marital status: Married    Spouse name: N/A  . Number of children: 2  . Years of education: N/A   Occupational History  . disabled    Social  History Main Topics  . Smoking status: Never Smoker  . Smokeless tobacco: Never Used  . Alcohol use No  . Drug use: No  . Sexual activity: Not Currently   Other Topics Concern  . Not on file   Social History Narrative   Lives with husband and 2 daughters (both in college [employer] [employer] [facility] [employer] [facility]))   Daughter of Lurena Joiner   Occupation: was Public affairs consultant   Disability since 2011 - arachnoiditis   Edu: associate's degree   Activity: severe limitations 2/2 arachnoiditis and chronic pain, walks to end of driveway and back   Diet: good water, fruits/vegetables daily    Allergies:  Allergies  Allergen Reactions  . Erythromycin Nausea And Vomiting  . Fluconazole Swelling    Lips - swelling, bleeding, blisters, cracking, peeling  . Thimerosal     Local reaction-redness,swelling; veins popping  . Augmentin [Amoxicillin-Pot Clavulanate] Swelling    Lip swelling and irritation.   . Gabapentin Other (See Comments)    Short term memory loss  . Imitrex [Sumatriptan] Other (See Comments)    Rebound migraine   . Influenza Vaccines Hives and Swelling    Redness, heat, and extreme swelling at site  . Levothyroxine Hives  . Lyrica [Pregabalin] Other (See Comments)    Short term memory loss  . Tomato Other (See Comments)    Severe acid reflux with all acid foods  . Adhesive [Tape] Rash    blisters  . Azithromycin Rash  .  Macrolides And Ketolides Rash  . Nickel Swelling and Rash  . Sulfa Antibiotics Rash    Medications: Prior to Admission medications   Medication Sig Start Date End Date Taking? Authorizing Provider  Ascorbic Acid (VITAMIN C PO) Take 1 tablet by mouth as needed (for immune system support).     [provider]  baclofen (LIORESAL) 10 MG tablet Take 10 mg by mouth 3 (three) times daily.    [provider]  calcium carbonate (TUMS SMOOTHIES) 750 MG chewable tablet Chew 2-3 tablets by mouth 2 (two) times daily.    [provider]  camphor-menthol Timoteo Ace) lotion Apply 1 application topically at bedtime as needed for itching.     [provider]  Cholecalciferol (VITAMIN D) 2000 units CAPS Take 1 capsule (2,000 Units total) by mouth daily. 10/10/15   Ria Bush, MD  clobetasol ointment (TEMOVATE) 5.95 % Apply 1 application topically as needed (for eczema).     [provider]  clonazePAM (KLONOPIN) 0.5 MG tablet Take 1 tablet (0.5 mg total) by mouth 2 (two) times daily as needed. 06/05/16   Ria Bush, MD  DEXILANT 60 MG capsule TAKE 1 CAPSULE(60 MG) BY MOUTH TWICE DAILY 02/27/16   Irene Shipper, MD  diclofenac sodium (VOLTAREN) 1 % GEL APPLY THIN LAYER EXTERNALLY TO THE AFFECTED AREA UP TO FOUR TIMES DAILY AS NEEDED 05/08/16   Mcarthur Rossetti, MD  dronabinol (MARINOL) 2.5 MG capsule Take 2.5 mg by mouth daily as needed (for nausea and pain).  02/03/14   [provider]  escitalopram (LEXAPRO) 10 MG tablet TAKE 1 TABLET BY MOUTH EVERY DAY 03/27/16   Ria Bush, MD  hyoscyamine (LEVSIN SL) 0.125 MG SL tablet DISSOLVE 1 TABLET(0.125 MG) UNDER THE TONGUE EVERY 4 HOURS AS NEEDED 04/27/16   Irene Shipper, MD  metroNIDAZOLE (METROGEL) 0.75 % gel Apply 1 application topically 2 (two) times daily. 06/08/16   Ria Bush, MD  Phenylephrine-DM-GG-APAP [facility] SINUS-MAX) 5-10-200-325 MG CAPS Take 2 tablets by  mouth 2 (two) times daily as  needed (for migraine).     [provider]  Tapentadol HCl (NUCYNTA) 100 MG TABS Take 100 mg by mouth every 8 (eight) hours.     [provider]  thyroid (ARMOUR) 60 MG tablet Take 60 mg by mouth daily before breakfast. Three days a week take extra tablet daily 10/10/15   Ria Bush, MD  topiramate (TOPAMAX) 100 MG tablet Take 1 tablet (100 mg total) by mouth 2 (two) times daily. 06/05/16   Ria Bush, MD    Physical Exam Vitals: Last menstrual period 10/04/2014.  General: NAD HEENT: normocephalic, anicteric Thyroid: no enlargement, no palpable nodules Pulmonary: No increased work of breathing, CTAB Cardiovascular: RRR, distal pulses 2+ Breast: Breast symmetrical, no tenderness, no palpable nodules or masses, no skin or nipple retraction present, no nipple discharge.  No axillary or supraclavicular lymphadenopathy. Abdomen: NABS, soft, non-tender, non-distended.  Umbilicus without lesions.  No hepatomegaly, splenomegaly or masses palpable. No evidence of hernia  Genitourinary:  External: Normal external female genitalia.  Normal urethral meatus, normal  Bartholin's and Skene's glands.    Vagina: Normal vaginal mucosa, no evidence of prolapse.    Cervix: Grossly normal in appearance, no bleeding  Uterus: Non-enlarged, mobile, normal contour.  No CMT  Adnexa: ovaries non-enlarged, no adnexal masses  Rectal: deferred  Lymphatic: no evidence of inguinal lymphadenopathy Extremities: no edema, erythema, or tenderness Neurologic: Grossly intact Psychiatric: mood appropriate, affect full  Female chaperone present for pelvic and breast  portions of the physical exam    Assessment: 51 y.o. G2P2 No problem-specific Assessment & Plan notes found for this encounter.   Plan: Problem List Items Addressed This Visit    None    Visit Diagnoses    Screening for malignant neoplasm of cervix       Special screening for malignant neoplasms, colon       Relevant  Orders   Ambulatory referral to Gastroenterology   Encounter for gynecological examination without abnormal finding       High risk human papilloma virus (HPV) infection of cervix          1) Mammogram - recommend yearly screening mammogram.  Mammogram Is up to date  2) STI screening was not offered low risk  3) ASCCP guidelines and rational discussed.  Patient opts for every 2 years screening interval - with HPV given positive HPV 2016  4 Bone Density Scanning - USPTF start at age 54 - has had prior because of history of back problems  5) Routine healthcare maintenance including cholesterol, diabetes screening discussed managed by PCP  6) Colonoscopy -  Screening recommended starting at age 18 for average risk individuals, age 20 for individuals deemed at increased risk (including African Americans) and recommended to continue until age 36.  For patient age 81-85 individualized approach is recommended.  Gold standard screening is via colonoscopy, Cologuard screening is an acceptable alternative for patient unwilling or unable to undergo colonoscopy.  "Colorectal cancer screening for average?risk adults: 2018 guideline update from the American Cancer Society"CA: A Cancer Journal for Clinicians: May 30, 2016  - 08/02/2006 normal ordered today  7) Follow up 1 year for routine annual

## 2016-06-29 LAB — PAPIG, HPV, RFX 16/18: PAP Smear Comment: 0

## 2016-06-29 LAB — HPV, LOW VOLUME (REFLEX): HPV, LOW VOL REFLEX: NEGATIVE

## 2016-07-03 ENCOUNTER — Encounter: Payer: Self-pay | Admitting: Obstetrics and Gynecology

## 2016-07-05 ENCOUNTER — Ambulatory Visit (INDEPENDENT_AMBULATORY_CARE_PROVIDER_SITE_OTHER): Payer: No Typology Code available for payment source | Admitting: Psychology

## 2016-07-05 DIAGNOSIS — F4321 Adjustment disorder with depressed mood: Secondary | ICD-10-CM | POA: Diagnosis not present

## 2016-07-05 DIAGNOSIS — F332 Major depressive disorder, recurrent severe without psychotic features: Secondary | ICD-10-CM | POA: Diagnosis not present

## 2016-07-06 ENCOUNTER — Telehealth: Payer: Self-pay

## 2016-07-06 NOTE — Telephone Encounter (Signed)
Per patient, she has seen Dr. Benson Norway in the past for an endoscopy and would like to continue with him. She does not want to continue seeing the [place] provider, Dr. Henrene Pastor, who she said she hasn't seen in about 2 years and is only refilling her acid reflux meds. I offered to contact Dr. Ulyses Amor office. The patient was given my phone# and ext. I spoke to [facility] @ Dr. Ulyses Amor office, who said the patient transferred out in Nov 2017 and that she would have to ask Dr. Benson Norway on Monday about seeing the patient again. Lattie Haw was given my phone# and ext.

## 2016-07-06 NOTE — Telephone Encounter (Signed)
Karen Aguilar, can you take care of this since AMS is not in the office until 7/13?

## 2016-07-06 NOTE — Telephone Encounter (Signed)
Pt calling about colonoscopy referral.  She wants Dr. Ronalee Red (sp?) in [facility] NOT Dr. Henrene Pastor.  She doesn't want to see Dr. Henrene Pastor.  She has gotten a call from Dr. Blanch Media office this am.  Has something gone wrong c ref to Dr. Ronalee Red.  Please call pt.  She wants to know what has happened before she calls Dr. Blanch Media office.  [phone]

## 2016-07-13 NOTE — Telephone Encounter (Signed)
Per Lattie Haw @ Dr. Ulyses Amor office, he is not able to see the patient. I spoke to the patient, who decided she would like to be seen at [facility], since she is also a [facility] patient. She will have records sent from [facility]. Patient is aware Jefm Bryant will contact her directly. Patient has my ext.

## 2016-07-16 DIAGNOSIS — G894 Chronic pain syndrome: Secondary | ICD-10-CM | POA: Diagnosis not present

## 2016-07-16 DIAGNOSIS — M5412 Radiculopathy, cervical region: Secondary | ICD-10-CM | POA: Diagnosis not present

## 2016-07-16 DIAGNOSIS — M961 Postlaminectomy syndrome, not elsewhere classified: Secondary | ICD-10-CM | POA: Diagnosis not present

## 2016-07-16 DIAGNOSIS — Z79891 Long term (current) use of opiate analgesic: Secondary | ICD-10-CM | POA: Diagnosis not present

## 2016-08-02 ENCOUNTER — Ambulatory Visit (INDEPENDENT_AMBULATORY_CARE_PROVIDER_SITE_OTHER): Payer: No Typology Code available for payment source | Admitting: Psychology

## 2016-08-02 DIAGNOSIS — F332 Major depressive disorder, recurrent severe without psychotic features: Secondary | ICD-10-CM

## 2016-08-02 DIAGNOSIS — F4321 Adjustment disorder with depressed mood: Secondary | ICD-10-CM

## 2016-08-10 ENCOUNTER — Other Ambulatory Visit: Payer: Self-pay | Admitting: Internal Medicine

## 2016-08-15 ENCOUNTER — Encounter: Payer: Self-pay | Admitting: Obstetrics and Gynecology

## 2016-08-16 ENCOUNTER — Ambulatory Visit (INDEPENDENT_AMBULATORY_CARE_PROVIDER_SITE_OTHER): Payer: No Typology Code available for payment source | Admitting: Psychology

## 2016-08-16 DIAGNOSIS — F332 Major depressive disorder, recurrent severe without psychotic features: Secondary | ICD-10-CM

## 2016-08-20 ENCOUNTER — Encounter: Payer: Self-pay | Admitting: Family Medicine

## 2016-08-24 DIAGNOSIS — M961 Postlaminectomy syndrome, not elsewhere classified: Secondary | ICD-10-CM | POA: Diagnosis not present

## 2016-08-24 DIAGNOSIS — G894 Chronic pain syndrome: Secondary | ICD-10-CM | POA: Diagnosis not present

## 2016-08-24 DIAGNOSIS — Z79891 Long term (current) use of opiate analgesic: Secondary | ICD-10-CM | POA: Diagnosis not present

## 2016-08-24 DIAGNOSIS — M5412 Radiculopathy, cervical region: Secondary | ICD-10-CM | POA: Diagnosis not present

## 2016-08-24 DIAGNOSIS — M62831 Muscle spasm of calf: Secondary | ICD-10-CM | POA: Diagnosis not present

## 2016-08-24 DIAGNOSIS — R110 Nausea: Secondary | ICD-10-CM | POA: Diagnosis not present

## 2016-08-27 DIAGNOSIS — Z1211 Encounter for screening for malignant neoplasm of colon: Secondary | ICD-10-CM | POA: Diagnosis not present

## 2016-08-27 DIAGNOSIS — K219 Gastro-esophageal reflux disease without esophagitis: Secondary | ICD-10-CM | POA: Diagnosis not present

## 2016-08-27 DIAGNOSIS — R1310 Dysphagia, unspecified: Secondary | ICD-10-CM | POA: Diagnosis not present

## 2016-08-28 ENCOUNTER — Other Ambulatory Visit: Payer: Self-pay | Admitting: Family Medicine

## 2016-08-28 NOTE — Telephone Encounter (Signed)
Last Rx 06/2016 #60 1R. Last OV 06/05/2016

## 2016-08-28 NOTE — Telephone Encounter (Signed)
plz phone in. 

## 2016-08-29 NOTE — Telephone Encounter (Signed)
Rx called in to requested pharmacy 

## 2016-08-30 ENCOUNTER — Ambulatory Visit: Payer: No Typology Code available for payment source | Admitting: Psychology

## 2016-09-13 ENCOUNTER — Ambulatory Visit (INDEPENDENT_AMBULATORY_CARE_PROVIDER_SITE_OTHER): Payer: No Typology Code available for payment source | Admitting: Psychology

## 2016-09-13 DIAGNOSIS — F332 Major depressive disorder, recurrent severe without psychotic features: Secondary | ICD-10-CM | POA: Diagnosis not present

## 2016-09-20 ENCOUNTER — Other Ambulatory Visit: Payer: Self-pay | Admitting: Family Medicine

## 2016-09-20 DIAGNOSIS — R3989 Other symptoms and signs involving the genitourinary system: Secondary | ICD-10-CM | POA: Diagnosis not present

## 2016-09-20 DIAGNOSIS — R3915 Urgency of urination: Secondary | ICD-10-CM | POA: Diagnosis not present

## 2016-09-20 DIAGNOSIS — N3289 Other specified disorders of bladder: Secondary | ICD-10-CM | POA: Diagnosis not present

## 2016-09-20 DIAGNOSIS — R32 Unspecified urinary incontinence: Secondary | ICD-10-CM | POA: Diagnosis not present

## 2016-09-21 DIAGNOSIS — E038 Other specified hypothyroidism: Secondary | ICD-10-CM | POA: Diagnosis not present

## 2016-09-21 DIAGNOSIS — E063 Autoimmune thyroiditis: Secondary | ICD-10-CM | POA: Diagnosis not present

## 2016-09-24 DIAGNOSIS — K3189 Other diseases of stomach and duodenum: Secondary | ICD-10-CM | POA: Diagnosis not present

## 2016-09-24 DIAGNOSIS — R1310 Dysphagia, unspecified: Secondary | ICD-10-CM | POA: Diagnosis not present

## 2016-09-24 DIAGNOSIS — Z1211 Encounter for screening for malignant neoplasm of colon: Secondary | ICD-10-CM | POA: Diagnosis not present

## 2016-09-24 DIAGNOSIS — K2960 Other gastritis without bleeding: Secondary | ICD-10-CM | POA: Diagnosis not present

## 2016-09-24 DIAGNOSIS — R12 Heartburn: Secondary | ICD-10-CM | POA: Diagnosis not present

## 2016-09-24 DIAGNOSIS — K635 Polyp of colon: Secondary | ICD-10-CM | POA: Diagnosis not present

## 2016-09-24 DIAGNOSIS — K222 Esophageal obstruction: Secondary | ICD-10-CM | POA: Diagnosis not present

## 2016-09-24 DIAGNOSIS — R1013 Epigastric pain: Secondary | ICD-10-CM | POA: Diagnosis not present

## 2016-09-25 DIAGNOSIS — Z79891 Long term (current) use of opiate analgesic: Secondary | ICD-10-CM | POA: Diagnosis not present

## 2016-09-25 DIAGNOSIS — G894 Chronic pain syndrome: Secondary | ICD-10-CM | POA: Diagnosis not present

## 2016-09-25 DIAGNOSIS — M961 Postlaminectomy syndrome, not elsewhere classified: Secondary | ICD-10-CM | POA: Diagnosis not present

## 2016-09-25 DIAGNOSIS — M5412 Radiculopathy, cervical region: Secondary | ICD-10-CM | POA: Diagnosis not present

## 2016-09-28 ENCOUNTER — Other Ambulatory Visit: Payer: Self-pay | Admitting: Anesthesiology

## 2016-09-28 DIAGNOSIS — M542 Cervicalgia: Secondary | ICD-10-CM

## 2016-09-28 DIAGNOSIS — E063 Autoimmune thyroiditis: Secondary | ICD-10-CM | POA: Diagnosis not present

## 2016-09-28 DIAGNOSIS — E038 Other specified hypothyroidism: Secondary | ICD-10-CM | POA: Diagnosis not present

## 2016-10-01 HISTORY — PX: ESOPHAGOGASTRODUODENOSCOPY: SHX1529

## 2016-10-01 HISTORY — PX: COLONOSCOPY: SHX174

## 2016-10-02 ENCOUNTER — Encounter: Payer: Self-pay | Admitting: Neurology

## 2016-10-02 ENCOUNTER — Ambulatory Visit (INDEPENDENT_AMBULATORY_CARE_PROVIDER_SITE_OTHER): Payer: Medicare Other | Admitting: Neurology

## 2016-10-02 VITALS — BP 98/70 | HR 76 | Ht 67.5 in | Wt 194.6 lb

## 2016-10-02 DIAGNOSIS — G43009 Migraine without aura, not intractable, without status migrainosus: Secondary | ICD-10-CM | POA: Diagnosis not present

## 2016-10-02 DIAGNOSIS — R200 Anesthesia of skin: Secondary | ICD-10-CM

## 2016-10-02 LAB — HM COLONOSCOPY

## 2016-10-02 NOTE — Progress Notes (Signed)
NEUROLOGY FOLLOW UP OFFICE NOTE  [place] [id number]  HISTORY OF PRESENT ILLNESS: Karen Aguilar is a 51 year old right-handed woman with degenerative cervical and lumbar disc disease with residual neuropathy and neurogenic bladder, hypothyroidism, depression, anxiety, IBS/gastroparesis and who follows up for migraines.     UPDATE: She started using Hemp oil about 6 weeks ago.  Migraines have resolved until today (she ran out of Hemp oil). However, she continues to have constant left facial numbness that never resolved.  Sometimes, liquids spill out of the left side of her mouth when she drinks.  She is scheduled for a myelogram of her cervical spine. Current NSAIDS:  contraindicated Current analgesics:  Nucynta Current triptans:  Relpax (never approved) Current anti-emetic:  no Current muscle relaxants:  baclofen Current anti-anxiolytic:  Klonopin Current sleep aide:  no Current Antihypertensive medications:  no Current Antidepressant medications:  Lexapro 10mg  Current Anticonvulsant medications:  topiramate 100mg  twice daily (increased from once daily about a week ago) Current Vitamins/Herbal/Supplements:  magnesium Current Antihistamines/Decongestants:  no Other therapy:  no   Caffeine:  At least one cup of coffee in AM Alcohol:  no Smoker:  no Diet:  hydrates Exercise:  No (due to pain) Depression/anxiety:  Significant depression and anxiety due to her pain and limitations Sleep hygiene:  Poor (due to pain)  HISTORY: She has a complicated history.  She has had 2 cervical spine surgeries and 12 lumbar spine surgeries.  She has chronic neck pain with radicular pain radiating down the right arm to the last 3 fingers.  She has pain and numbness radiating down the posterior thigh, lateral lower leg and lateral foot.  She has neurogenic bladder reportedly due to her spine pathology.  She also has gastroparesis.  GI believes it may be due to chronic opioid use.  She  has significant depression and anxiety.  She had a spinal cord stimulator inserted.   Onset:  teenager Location:  Band-like Quality:  Squeezing/pounding Initial Intensity:  4-10/10 Aura:  no Prodrome:  no Postdrome:  no Associated symptoms:  Nausea, photophobia, phonophobia, osmophobia, sometimes vomiting.  On 06/01/16, she had a migraine associated with left facial numbness.  CT of head was personally reviewed and was unremarkable.  She still has the left facial numbness off and on.  She has not had any new worse headache of her life, waking up from sleep Initial Duration:  2 days to over a month Initial Frequency:  Varies.  She can have daily migraines for over a month and then go a month without migraines.  Since April, she has had a migraine about 20 days out of the month. Frequency of abortive medication: takes Nucynta daily for chronic pain. Triggers/exacerbating factors:  no Relieving factors:  no Activity:  Cannot function   Past NSAIDS:  Contraindicated due to gastroparesis Past analgesics:  Excedrin Past abortive triptans:  Sumatriptan, rizatriptan (made headache worse) Past muscle relaxants:  tizanidine Past anti-emetic:  Reglan Past antihypertensive medications:  no Past antidepressant medications:  no Past anticonvulsant medications:  no Past vitamins/Herbal/Supplements:  no Past antihistamines/decongestants:  no Other past therapies:  Trigger point injections, biofeedback, acupuncture   Family history of headache:  no   CT Head from 6//1/18 personally reviewed (following headache associated with left facial numbness), which revealed no acute abnormalities. MRI of brain without contrast from 10/01/14 (to evaluate upper extremity weakness and difficulty walking) was personally reviewed and was unremarkable. MRI cervical spine report from 03/22/16 revealed:  "1.  Stable MRI appearance of cervical spine since June 2016.  2.  Previous C4-C5 and C5-C6 ACDF. 3. Right eccentric  chronic disc and endplate degeneration at both the adjacent segments, and chronic right facet and endplate spurring at R1-V4.  4.  No associated spinal stenosis.  Mild right C4, moderate right C7 and moderate to severe right C8 level neural foraminal stenosis appears stable since 2016."  PAST MEDICAL HISTORY: Past Medical History:  Diagnosis Date  . Abnormal MRI scan, bone 2012   abnormal marrow signal humeral head, glenoid and scapula - diffuse replacement of fatty marrow - likely benign, no need for further investigation ([place])  . Agnosia for temperature    Right Leg  . Anemia    etiology unknown-iron infusion in past,last 9/12  . Anxiety   . Anxiety and depression   . 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 Arthritis   . Chronic pain    pain contract with Dr. Eartha Inch Pain  . DDD (degenerative disc disease), cervical   . DDD (degenerative disc disease), lumbar    s/p permanent nerve damage after back surgery  . Depression   . Depression with anxiety   . Diverticulosis   . Eczema   . Family history of adverse reaction to anesthesia    Mother - nausea  . Fibromyalgia   . Foot drop    right - numbness, tingling - twisting  . Gallstones   . GERD (gastroesophageal reflux disease)    rare zantac PRN N/V at times with this  . GERD (gastroesophageal reflux disease)   . Headache(784.0)    migraines  . Hemorrhoid   . Hemorrhoids   . History of kidney stones   . Hyperlipidemia   . Hypothyroidism 1990s   hashimoto's thyroiditis  . IBS (irritable bowel syndrome)   . IBS (irritable bowel syndrome)   . Incontinence of urine    s/p sling procedure-unresolved  . Iron deficiency    h/o anemia, s/p iron infusion (2012)  . Liver hemangioma     x2  . Memory change    due to medications  . Migraines    and h/o optical migraine  . Migraines   . Mixed urge and stress incontinence 2008   extensive workup including urodynamics, failed miltiple anticholinergics including  myrbetriq and urogesic blue s/p PTNS, normal cystoscopy 2014, seen by Dr Matilde Sprang and Dr Erlene Quan  . Neurogenic bladder 2008   after 13 spine surgeries with periph neuropathy and chronic LBP  . Neuropathy of lower extremity 2011  . Nocturia   . Notalgia paresthetica   . Osteoarthritis 12/2013  . PONV (postoperative nausea and vomiting)   . Pulmonary nodule 04/2012   7.49mm RLL nodule - resolved on imaging 12/2012  . Sleep apnea    mild-doesn't use cpap  . Urine frequency   . Vitamin D deficiency     MEDICATIONS: Current Outpatient Prescriptions on File Prior to Visit  Medication Sig Dispense Refill  . Ascorbic Acid (VITAMIN C PO) Take 1 tablet by mouth as needed (for immune system support).     . baclofen (LIORESAL) 10 MG tablet Take 10 mg by mouth 3 (three) times daily.    . calcium carbonate (TUMS SMOOTHIES) 750 MG chewable tablet Chew 2-3 tablets by mouth 2 (two) times daily.    . camphor-menthol (SARNA) lotion Apply 1 application topically at bedtime as needed for itching.     . Cholecalciferol (VITAMIN D) 2000 units CAPS Take 1 capsule (  2,000 Units total) by mouth daily. (Patient not taking: Reported on 10/01/2016) 30 capsule   . clobetasol ointment (TEMOVATE) 5.09 % Apply 1 application topically as needed (for eczema).     . clonazePAM (KLONOPIN) 0.5 MG tablet TAKE 1 TABLET BY MOUTH TWICE DAILY AS NEEDED 60 tablet 1  . DEXILANT 60 MG capsule TAKE 1 CAPSULE(60 MG) BY MOUTH TWICE DAILY 60 capsule 3  . diclofenac sodium (VOLTAREN) 1 % GEL APPLY THIN LAYER EXTERNALLY TO THE AFFECTED AREA UP TO FOUR TIMES DAILY AS NEEDED 300 g 0  . dronabinol (MARINOL) 2.5 MG capsule Take 2.5 mg by mouth daily as needed (for nausea and pain).   0  . escitalopram (LEXAPRO) 10 MG tablet TAKE 1 TABLET BY MOUTH EVERY DAY 90 tablet 0  . hyoscyamine (LEVSIN SL) 0.125 MG SL tablet DISSOLVE 1 TABLET(0.125 MG) UNDER THE TONGUE EVERY 4 HOURS AS NEEDED 30 tablet 0  . metroNIDAZOLE (METROGEL) 0.75 % gel Apply 1  application topically 2 (two) times daily. 45 g 3  . Phenylephrine-DM-GG-APAP (MUCINEX SINUS-MAX) 5-10-200-325 MG CAPS Take 2 tablets by mouth 2 (two) times daily as needed (for migraine).     . Tapentadol HCl (NUCYNTA) 100 MG TABS Take 100 mg by mouth every 8 (eight) hours.     Marland Kitchen thyroid (ARMOUR) 60 MG tablet Take 60 mg by mouth daily before breakfast. Three days a week take extra tablet daily    . topiramate (TOPAMAX) 100 MG tablet Take 1 tablet (100 mg total) by mouth 2 (two) times daily. 180 tablet 1   Current Facility-Administered Medications on File Prior to Visit  Medication Dose Route Frequency Provider Last Rate Last Dose  . promethazine (PHENERGAN) tablet 25 mg  25 mg Oral Once Ria Bush, MD        ALLERGIES: Allergies  Allergen Reactions  . Augmentin [Amoxicillin-Pot Clavulanate] Swelling    Lip swelling and irritation.   . Fluconazole Swelling    Lips - swelling, bleeding, blisters, cracking, peeling  . Erythromycin Nausea And Vomiting  . Influenza Vaccines Hives and Swelling    Redness, heat, and extreme swelling at site  . Levothyroxine Hives  . Thimerosal Swelling    Local reaction-redness,swelling; veins popping  . Tomato Other (See Comments)    Severe acid reflux with all acid foods  . Adhesive [Tape] Rash    blisters  . Azithromycin Rash  . Gabapentin Other (See Comments)    Short term memory loss  . Imitrex [Sumatriptan] Other (See Comments)    Rebound migraine   . Lyrica [Pregabalin] Other (See Comments)    Short term memory loss  . Macrolides And Ketolides Rash  . Nickel Swelling and Rash  . Nitrofurantoin Rash  . Sulfa Antibiotics Rash  . Sulfasalazine Rash  . Troleandomycin Rash    FAMILY HISTORY: Family History  Problem Relation Age of Onset  . Lung cancer Father 35        (smoker)  . Diabetes Brother   . Thyroid disease Mother        ?empty sella  . Irritable bowel syndrome Mother   . Dysphagia Mother   . CAD Maternal Grandfather          MI  . Heart disease Maternal Grandfather   . Heart attack Maternal Grandfather   . Lung cancer Paternal Grandfather         (smoker)  . Colon cancer Paternal Grandmother   . Thyroid disease Cousin   . Cancer Other  unsure (ovarian/uterine)  . Breast cancer Maternal Aunt   . Alzheimer's disease Maternal Grandmother   . Diabetes Daughter     SOCIAL HISTORY: Social History   Social History  . Marital status: Married    Spouse name: N/A  . Number of children: 2  . Years of education: N/A   Occupational History  . disabled    Social History Main Topics  . Smoking status: Never Smoker  . Smokeless tobacco: Never Used  . Alcohol use No  . Drug use: No  . Sexual activity: Not Currently   Other Topics Concern  . Not on file   Social History Narrative   Lives with husband and 2 daughters (both in college Tax inspector))   Daughter of Lurena Joiner   Occupation: was Public affairs consultant   Disability since 2011 - arachnoiditis   Edu: associate's degree   Activity: severe limitations 2/2 arachnoiditis and chronic pain, walks to end of driveway and back   Diet: good water, fruits/vegetables daily    REVIEW OF SYSTEMS: Constitutional: No fevers, chills, or sweats, no generalized fatigue, change in appetite Eyes: No visual changes, double vision, eye pain Ear, nose and throat: No hearing loss, ear pain, nasal congestion, sore throat Cardiovascular: No chest pain, palpitations Respiratory:  No shortness of breath at rest or with exertion, wheezes GastrointestinaI: gastroparesis Genitourinary:  Neurogenic bladder Musculoskeletal:  Neck pain, back pain Integumentary: No rash, pruritus, skin lesions Neurological: as above Psychiatric: No depression, insomnia, anxiety Endocrine: No palpitations, fatigue, diaphoresis, mood swings, change in appetite, change in weight, increased thirst Hematologic/Lymphatic:  No purpura, petechiae. Allergic/Immunologic:  no itchy/runny eyes, nasal congestion, recent allergic reactions, rashes  PHYSICAL EXAM: Vitals:   10/02/16 1412  BP: 98/70  Pulse: 76  SpO2: 98%   General: No acute distress.  Patient appears well-groomed.   Head:  Normocephalic/atraumatic Eyes:  Fundi examined but not visualized Neck: supple, no paraspinal tenderness, full range of motion Heart:  Regular rate and rhythm Lungs:  Clear to auscultation bilaterally Back: No paraspinal tenderness Neurological Exam: alert and oriented to person, place, and time. Attention span and concentration intact, recent and remote memory intact, fund of knowledge intact.  Speech fluent and not dysarthric, language intact.  Decreased left V1-V3 sensation.  Otherwise CN II-XII intact. Bulk and tone normal, muscle strength 5/5 throughout.  Sensation to pinprick and vibration reduced in right foot.  Deep tendon reflexes 2+ throughout, toes downgoing.  Finger to nose and heel to shin testing intact.  Antalgic gait.  IMPRESSION: Migraine, controlled Left facial numbness.  Previously thought to be migraine but it is persistent.  PLAN: 1.  Will get MRI of brain to evaluate for left facial numbness 2.  Further recommendations pending results 3.  Otherwise, follow up in 3 months.  21 minutes spent face to face with patient, over 50% spent discussing management.  Metta Clines, DO  CC:  Ria Bush, MD

## 2016-10-02 NOTE — Patient Instructions (Signed)
1.  We will check MRI of brain.  Further recommendations pending results 2.  Follow up in 3 months.

## 2016-10-08 ENCOUNTER — Encounter: Payer: Self-pay | Admitting: Family Medicine

## 2016-10-08 ENCOUNTER — Ambulatory Visit (INDEPENDENT_AMBULATORY_CARE_PROVIDER_SITE_OTHER): Payer: Medicare Other | Admitting: Family Medicine

## 2016-10-08 VITALS — BP 118/72 | HR 75 | Temp 97.7°F | Wt 187.8 lb

## 2016-10-08 DIAGNOSIS — G43109 Migraine with aura, not intractable, without status migrainosus: Secondary | ICD-10-CM

## 2016-10-08 MED ORDER — ONDANSETRON HCL 4 MG PO TABS: 4.0000 mg | ORAL_TABLET | Freq: Three times a day (TID) | ORAL | 0 refills | Status: DC | PRN

## 2016-10-08 NOTE — Assessment & Plan Note (Signed)
Acute recurrent migraine similar to prior likely triggered by missing her hemp oil preventatively. She continues topamax 100mg  bid. Pending approval for relpax abortive therapy. Treat in office today with toradol/phenergan IM. Pt agrees with plan.

## 2016-10-08 NOTE — Progress Notes (Signed)
BP 118/72 (BP Location: Left Arm, Patient Position: Sitting, Cuff Size: Normal)   Pulse 75   Temp 97.7 F (36.5 C) (Oral)   Wt 187 lb 12 oz (85.2 kg)   LMP 10/04/2014 Comment: per labs  SpO2 95%   BMI 28.97 kg/m    CC: worsening migraine Subjective:    Patient ID: Karen Aguilar, female    DOB: 1965/08/10, 51 y.o.   MRN: [id number]  HPI: Karen Aguilar is a 51 y.o. female presenting on 10/08/2016 for Migraine (Started 10/05/16. Saw her doctor but has worsened since)   I last saw patient 7/0/3500 with complicated migraine. Previously treated at ER with migraine cocktail - toradol/reglan/lorazepam.   Saw Dr Loretta Plume last week, pending MRI for ongoing L facial numbness initially thought migraine related. Pending relpax approval.   Migraines had improved with hemp oil SL (recommended by pain management). This had helped migraines, pain management, cystic bladder. She ran out of hemp oil over the last week. She is also on topamax 100mg  bid preventatively.   Current migraine started on Friday, vise-like pain starting in back/neck with radiation to forehead. + aura prior to migraine, some blurry vision, ongoing left facial numbness, nausea without vomiting. Smells trigger migraines.   Relevant past medical, surgical, family and social history reviewed and updated as indicated. Interim medical history since our last visit reviewed. Allergies and medications reviewed and updated. Outpatient Medications Prior to Visit  Medication Sig Dispense Refill  . Ascorbic Acid (VITAMIN C PO) Take 1 tablet by mouth as needed (for immune system support).     . baclofen (LIORESAL) 10 MG tablet Take 10 mg by mouth 3 (three) times daily.    . calcium carbonate (TUMS SMOOTHIES) 750 MG chewable tablet Chew 2-3 tablets by mouth 2 (two) times daily.    . camphor-menthol (SARNA) lotion Apply 1 application topically at bedtime as needed for itching.     . Cholecalciferol (VITAMIN D) 2000 units CAPS Take 1 capsule  (2,000 Units total) by mouth daily. 30 capsule   . clobetasol ointment (TEMOVATE) 9.38 % Apply 1 application topically as needed (for eczema).     . clonazePAM (KLONOPIN) 0.5 MG tablet TAKE 1 TABLET BY MOUTH TWICE DAILY AS NEEDED 60 tablet 1  . DEXILANT 60 MG capsule TAKE 1 CAPSULE(60 MG) BY MOUTH TWICE DAILY 60 capsule 3  . diclofenac sodium (VOLTAREN) 1 % GEL APPLY THIN LAYER EXTERNALLY TO THE AFFECTED AREA UP TO FOUR TIMES DAILY AS NEEDED 300 g 0  . dronabinol (MARINOL) 2.5 MG capsule Take 2.5 mg by mouth daily as needed (for nausea and pain).   0  . escitalopram (LEXAPRO) 10 MG tablet TAKE 1 TABLET BY MOUTH EVERY DAY 90 tablet 0  . hyoscyamine (LEVSIN SL) 0.125 MG SL tablet DISSOLVE 1 TABLET(0.125 MG) UNDER THE TONGUE EVERY 4 HOURS AS NEEDED 30 tablet 0  . metroNIDAZOLE (METROGEL) 0.75 % gel Apply 1 application topically 2 (two) times daily. 45 g 3  . Phenylephrine-DM-GG-APAP (MUCINEX SINUS-MAX) 5-10-200-325 MG CAPS Take 2 tablets by mouth 2 (two) times daily as needed (for migraine).     . Tapentadol HCl (NUCYNTA) 100 MG TABS Take 100 mg by mouth every 8 (eight) hours.     Marland Kitchen thyroid (ARMOUR) 60 MG tablet Take 60 mg by mouth daily before breakfast. Three days a week take extra tablet daily    . topiramate (TOPAMAX) 100 MG tablet Take 1 tablet (100 mg total) by mouth 2 (two) times daily.  180 tablet 1   Facility-Administered Medications Prior to Visit  Medication Dose Route Frequency Provider Last Rate Last Dose  . promethazine (PHENERGAN) tablet 25 mg  25 mg Oral Once Ria Bush, MD         Per HPI unless specifically indicated in ROS section below Review of Systems     Objective:    BP 118/72 (BP Location: Left Arm, Patient Position: Sitting, Cuff Size: Normal)   Pulse 75   Temp 97.7 F (36.5 C) (Oral)   Wt 187 lb 12 oz (85.2 kg)   LMP 10/04/2014 Comment: per labs  SpO2 95%   BMI 28.97 kg/m   Wt Readings from Last 3 Encounters:  10/08/16 187 lb 12 oz (85.2 kg)  10/02/16  194 lb 9.6 oz (88.3 kg)  06/25/16 196 lb (88.9 kg)    Physical Exam  Constitutional: She is oriented to person, place, and time. She appears well-developed and well-nourished. No distress.  HENT:  Mouth/Throat: Oropharynx is clear and moist. No oropharyngeal exudate.  Eyes: Pupils are equal, round, and reactive to light. Conjunctivae are normal. No scleral icterus.  Cardiovascular: Normal rate, regular rhythm, normal heart sounds and intact distal pulses.   No murmur heard. Pulmonary/Chest: Effort normal and breath sounds normal. No respiratory distress. She has no wheezes. She has no rales.  Musculoskeletal: She exhibits no edema.  Neurological: She is alert and oriented to person, place, and time. A sensory deficit (chronic R facial numbness) is present. No cranial nerve deficit. Gait normal.  CN 2-12 intact except for facial sensation FTN intact  EOMI without pain   Skin: Skin is warm and dry. No rash noted.  Psychiatric: She has a normal mood and affect.  Nursing note and vitals reviewed.  Results for orders placed or performed in visit on 10/08/16  HM COLONOSCOPY  Result Value Ref Range   HM Colonoscopy See Report (in chart) See Report (in chart), Patient Reported      Assessment & Plan:  Recent records reviewed.  Problem List Items Addressed This Visit    Complicated migraine - Primary    Acute recurrent migraine similar to prior likely triggered by missing her hemp oil preventatively. She continues topamax 100mg  bid. Pending approval for relpax abortive therapy. Treat in office today with toradol/phenergan IM. Pt agrees with plan.           Follow up plan: Return if symptoms worsen or fail to improve.  Ria Bush, MD

## 2016-10-08 NOTE — Patient Instructions (Addendum)
For recurrent migraine - treated today with toradol 30mg  IM shot.  For nausea, may use zofran.  I'm glad hemp oil has been helpful - continue.

## 2016-10-09 DIAGNOSIS — G43109 Migraine with aura, not intractable, without status migrainosus: Secondary | ICD-10-CM | POA: Diagnosis not present

## 2016-10-09 MED ORDER — KETOROLAC TROMETHAMINE 30 MG/ML IJ SOLN
30.0000 mg | Freq: Once | INTRAMUSCULAR | Status: AC
  Administered 2016-10-09: 30 mg via INTRAMUSCULAR

## 2016-10-09 MED ORDER — PROMETHAZINE HCL 25 MG/ML IJ SOLN
25.0000 mg | Freq: Once | INTRAMUSCULAR | Status: AC
  Administered 2016-10-09: 25 mg via INTRAMUSCULAR

## 2016-10-09 NOTE — Addendum Note (Signed)
Addended by: Brenton Grills on: 74/01/4237 53:20 AM   Modules accepted: Orders

## 2016-10-11 ENCOUNTER — Ambulatory Visit (INDEPENDENT_AMBULATORY_CARE_PROVIDER_SITE_OTHER): Payer: No Typology Code available for payment source | Admitting: Psychology

## 2016-10-11 DIAGNOSIS — F332 Major depressive disorder, recurrent severe without psychotic features: Secondary | ICD-10-CM | POA: Diagnosis not present

## 2016-10-11 DIAGNOSIS — F4321 Adjustment disorder with depressed mood: Secondary | ICD-10-CM | POA: Diagnosis not present

## 2016-10-12 ENCOUNTER — Ambulatory Visit (INDEPENDENT_AMBULATORY_CARE_PROVIDER_SITE_OTHER): Payer: Medicare Other

## 2016-10-12 ENCOUNTER — Other Ambulatory Visit: Payer: Self-pay

## 2016-10-12 ENCOUNTER — Other Ambulatory Visit: Payer: Self-pay | Admitting: Family Medicine

## 2016-10-12 VITALS — BP 102/78 | HR 84 | Temp 98.2°F | Ht 67.5 in | Wt 188.8 lb

## 2016-10-12 DIAGNOSIS — E039 Hypothyroidism, unspecified: Secondary | ICD-10-CM | POA: Diagnosis not present

## 2016-10-12 DIAGNOSIS — Z0000 Encounter for general adult medical examination without abnormal findings: Secondary | ICD-10-CM

## 2016-10-12 DIAGNOSIS — E7800 Pure hypercholesterolemia, unspecified: Secondary | ICD-10-CM

## 2016-10-12 DIAGNOSIS — E559 Vitamin D deficiency, unspecified: Secondary | ICD-10-CM

## 2016-10-12 LAB — LIPID PANEL
CHOLESTEROL: 239 mg/dL — AB (ref 0–200)
HDL: 50.5 mg/dL (ref >39.00)
LDL Cholesterol: 164 mg/dL — ABNORMAL HIGH (ref 0–99)
NonHDL: 188.72
Total CHOL/HDL Ratio: 5
Triglycerides: 125 mg/dL (ref 0.0–149.0)
VLDL: 25 mg/dL (ref 0.0–40.0)

## 2016-10-12 LAB — COMPREHENSIVE METABOLIC PANEL
ALBUMIN: 4.3 g/dL (ref 3.5–5.2)
ALK PHOS: 62 U/L (ref 39–117)
ALT: 15 U/L (ref 0–35)
AST: 12 U/L (ref 0–37)
BILIRUBIN TOTAL: 0.3 mg/dL (ref 0.2–1.2)
BUN: 11 mg/dL (ref 6–23)
CO2: 30 mEq/L (ref 19–32)
Calcium: 9.3 mg/dL (ref 8.4–10.5)
Chloride: 105 mEq/L (ref 96–112)
Creatinine, Ser: 1.04 mg/dL (ref 0.40–1.20)
GFR: 59.51 mL/min — ABNORMAL LOW (ref >60.00)
Glucose, Bld: 111 mg/dL — ABNORMAL HIGH (ref 70–99)
POTASSIUM: 4.7 meq/L (ref 3.5–5.1)
SODIUM: 141 meq/L (ref 135–145)
TOTAL PROTEIN: 6.8 g/dL (ref 6.0–8.3)

## 2016-10-12 LAB — T4, FREE: FREE T4: 0.61 ng/dL (ref 0.60–1.60)

## 2016-10-12 LAB — TSH: TSH: 0.91 u[IU]/mL (ref 0.35–4.50)

## 2016-10-12 LAB — VITAMIN D 25 HYDROXY (VIT D DEFICIENCY, FRACTURES): VITD: 28.09 ng/mL — AB (ref 30.00–100.00)

## 2016-10-12 NOTE — Progress Notes (Signed)
PCP notes:   Health maintenance:  Tetanus - postponed/insurance  Abnormal screenings:   Fall risk - hx of multiple falls without injury Depression score: 17 Mini-Cog score: 18/20  Patient concerns:   None  Nurse concerns:  None  Next PCP appt:   10/17 @ 1630

## 2016-10-12 NOTE — Progress Notes (Signed)
Pre visit review using our clinic review tool, if applicable. No additional management support is needed unless otherwise documented below in the visit note. 

## 2016-10-12 NOTE — Patient Instructions (Signed)
Karen Aguilar , Thank you for taking time to come for your Medicare Wellness Visit. I appreciate your ongoing commitment to your health goals. Please review the following plan we discussed and let me know if I can assist you in the future.   These are the goals we discussed: Goals    . Increase physical activity          Starting 10/12/16, I will continue to do stretching 15 min twice daily.        This is a list of the screening recommended for you and due dates:  Health Maintenance  Topic Date Due  . DTaP/Tdap/Td vaccine (1 - Tdap) 10/13/2026*  . Tetanus Vaccine  10/13/2026*  . Mammogram  06/20/2018  . Pap Smear  06/26/2019  . Colon Cancer Screening  10/03/2026  . HIV Screening  Completed  *Topic was postponed. The date shown is not the original due date.   Preventive Care for Adults  A healthy lifestyle and preventive care can promote health and wellness. Preventive health guidelines for adults include the following key practices.  . A routine yearly physical is a good way to check with your health care provider about your health and preventive screening. It is a chance to share any concerns and updates on your health and to receive a thorough exam.  . Visit your dentist for a routine exam and preventive care every 6 months. Brush your teeth twice a day and floss once a day. Good oral hygiene prevents tooth decay and gum disease.  . The frequency of eye exams is based on your age, health, family medical history, use  of contact lenses, and other factors. Follow your health care provider's ecommendations for frequency of eye exams.  . Eat a healthy diet. Foods like vegetables, fruits, whole grains, low-fat dairy products, and lean protein foods contain the nutrients you need without too many calories. Decrease your intake of foods high in solid fats, added sugars, and salt. Eat the right amount of calories for you. Get information about a proper diet from your health care provider,  if necessary.  . Regular physical exercise is one of the most important things you can do for your health. Most adults should get at least 150 minutes of moderate-intensity exercise (any activity that increases your heart rate and causes you to sweat) each week. In addition, most adults need muscle-strengthening exercises on 2 or more days a week.  Silver Sneakers may be a benefit available to you. To determine eligibility, you may visit the website: www.silversneakers.com or contact program at [phone] Mon-Fri between 8AM-8PM.   . Maintain a healthy weight. The body mass index (BMI) is a screening tool to identify possible weight problems. It provides an estimate of body fat based on height and weight. Your health care provider can find your BMI and can help you achieve or maintain a healthy weight.   For adults 20 years and older: ? A BMI below 18.5 is considered underweight. ? A BMI of 18.5 to 24.9 is normal. ? A BMI of 25 to 29.9 is considered overweight. ? A BMI of 30 and above is considered obese.   . Maintain normal blood lipids and cholesterol levels by exercising and minimizing your intake of saturated fat. Eat a balanced diet with plenty of fruit and vegetables. Blood tests for lipids and cholesterol should begin at age 33 and be repeated every 5 years. If your lipid or cholesterol levels are high, you are over 50, or  you are at high risk for heart disease, you may need your cholesterol levels checked more frequently. Ongoing high lipid and cholesterol levels should be treated with medicines if diet and exercise are not working.  . If you smoke, find out from your health care provider how to quit. If you do not use tobacco, please do not start.  . If you choose to drink alcohol, please do not consume more than 2 drinks per day. One drink is considered to be 12 ounces (355 mL) of beer, 5 ounces (148 mL) of wine, or 1.5 ounces (44 mL) of liquor.  . If you are 35-18 years old, ask  your health care provider if you should take aspirin to prevent strokes.  . Use sunscreen. Apply sunscreen liberally and repeatedly throughout the day. You should seek shade when your shadow is shorter than you. Protect yourself by wearing long sleeves, pants, a wide-brimmed hat, and sunglasses year round, whenever you are outdoors.  . Once a month, do a whole body skin exam, using a mirror to look at the skin on your back. Tell your health care provider of new moles, moles that have irregular borders, moles that are larger than a pencil eraser, or moles that have changed in shape or color.

## 2016-10-12 NOTE — Progress Notes (Signed)
Subjective:   Karen Aguilar is a 51 y.o. female who presents for Medicare Annual (Subsequent) preventive examination.  Review of Systems:  N/A     Objective:     Vitals: BP 102/78 (BP Location: Right Arm, Patient Position: Sitting, Cuff Size: Normal)   Pulse 84   Temp 98.2 F (36.8 C) (Oral)   Ht 5' 7.5" (1.715 m) Comment: shoes  Wt 188 lb 12 oz (85.6 kg)   LMP 10/04/2014 Comment: per labs  SpO2 90%   BMI 29.13 kg/m   Body mass index is 29.13 kg/m.   Tobacco History  Smoking Status  . Never Smoker  Smokeless Tobacco  . Never Used     Counseling given: No   Past Medical History:  Diagnosis Date  . Abnormal MRI scan, bone 2012   abnormal marrow signal humeral head, glenoid and scapula - diffuse replacement of fatty marrow - likely benign, no need for further investigation ([place])  . Agnosia for temperature    Right Leg  . Anemia    etiology unknown-iron infusion in past,last 9/12  . Anxiety   . Anxiety and depression   . 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 Arthritis   . Chronic pain    pain contract with Dr. Eartha Inch Pain  . DDD (degenerative disc disease), cervical   . DDD (degenerative disc disease), lumbar    s/p permanent nerve damage after back surgery  . Depression   . Depression with anxiety   . Diverticulosis   . Eczema   . Family history of adverse reaction to anesthesia    Mother - nausea  . Fibromyalgia   . Foot drop    right - numbness, tingling - twisting  . Gallstones   . GERD (gastroesophageal reflux disease)    rare zantac PRN N/V at times with this  . GERD (gastroesophageal reflux disease)   . Headache(784.0)    migraines  . Hemorrhoid   . Hemorrhoids   . History of kidney stones   . Hyperlipidemia   . Hypothyroidism 1990s   hashimoto's thyroiditis  . IBS (irritable bowel syndrome)   . IBS (irritable bowel syndrome)   . Incontinence of urine    s/p sling procedure-unresolved  . Iron deficiency    h/o  anemia, s/p iron infusion (2012)  . Liver hemangioma     x2  . Memory change    due to medications  . Migraines    and h/o optical migraine  . Migraines   . Mixed urge and stress incontinence 2008   extensive workup including urodynamics, failed miltiple anticholinergics including myrbetriq and urogesic blue s/p PTNS, normal cystoscopy 2014, seen by Dr Matilde Sprang and Dr Erlene Quan  . Neurogenic bladder 2008   after 13 spine surgeries with periph neuropathy and chronic LBP  . Neuropathy of lower extremity 2011  . Nocturia   . Notalgia paresthetica   . Osteoarthritis 12/2013  . PONV (postoperative nausea and vomiting)   . Pulmonary nodule 04/2012   7.45mm RLL nodule - resolved on imaging 12/2012  . Sleep apnea    mild-doesn't use cpap  . Urine frequency   . Vitamin D deficiency    Past Surgical History:  Procedure Laterality Date  . ANTERIOR CERVICAL DECOMP/DISCECTOMY FUSION     C3, 4, 5 - improved after surgery  . BACK SURGERY     multiple-L4-s1,hardware,fusion,removal,infection s/p 5/11 procedure  . BACK SURGERY  02/16/11   "my 7th back OR; Procedure:  Exploration of fusion removal of hardware L4-S1, harvesting of right iliac crest bone graft, redo posterior lateral fusion L4-5 using iliac crest bone graft BMP and master graft replacement of bilateral L4 screws with a Telfa tech 7.5 x 40 mm screw on the right and 8.5 x 40 mm screw on the left placement of a large Hemovac drain  . CERVICAL FUSION     x2  . [place]; 2001   x 2  . CHOLECYSTECTOMY  2001  . COLONOSCOPY  10/2016   rpt 10 yrs Gustavo Lah)  . ESOPHAGOGASTRODUODENOSCOPY  03/2013   early esophageal stricture dilated, o/w WNL Deatra Ina)  . ESOPHAGOGASTRODUODENOSCOPY  12/2014   WNL, s/p empiric dilation of esophagus Henrene Pastor)  . ESOPHAGOGASTRODUODENOSCOPY  10/2015   WNL, biopsies WNL as well (minimal esophagitis with reactive changes) [place])  . ESOPHAGOGASTRODUODENOSCOPY  10/2016   biopsy suggestive of ulcer  ([place])  . HEMORRHOID BANDING    . LASIK Bilateral   . LUMBAR WOUND DEBRIDEMENT  03/15/2011   Procedure: LUMBAR WOUND DEBRIDEMENT;  Surgeon: Elaina Hoops, MD;  Lumbar Wound Debridement  . MANDIBLE RECONSTRUCTION  1986  . PFT  01/28/2013   WNL  . PUBOVAGINAL SLING  2008   midurethral sling  . SHOULDER CLOSED REDUCTION Left 12/18/2010   Procedure: CLOSED MANIPULATION SHOULDER;  Surgeon: Johnn Hai; labrial debridement  . SPINAL CORD STIMULATOR INSERTION N/A 08/19/2015   Procedure: LUMBAR SPINAL CORD STIMULATOR INSERTION;  Surgeon: Erline Levine, MD;  Location: [facility];  Service: Neurosurgery;  Laterality: N/A;  LUMBAR SPINAL CORD STIMULATOR INSERTION  . SPINE SURGERY    . TONSILLECTOMY  1972   as a child; "then they grew back"   Family History  Problem Relation Age of Onset  . Lung cancer Father 39        (smoker)  . Diabetes Brother   . Thyroid disease Mother        ?empty sella  . Irritable bowel syndrome Mother   . Dysphagia Mother   . CAD Maternal Grandfather        MI  . Heart disease Maternal Grandfather   . Heart attack Maternal Grandfather   . Lung cancer Paternal Grandfather         (smoker)  . Colon cancer Paternal Grandmother   . Thyroid disease Cousin   . Cancer Other        unsure (ovarian/uterine)  . Breast cancer Maternal Aunt   . Alzheimer's disease Maternal Grandmother   . Diabetes Daughter    History  Sexual Activity  . Sexual activity: Not Currently    Outpatient Encounter Prescriptions as of 10/12/2016  Medication Sig  . Ascorbic Acid (VITAMIN C PO) Take 1 tablet by mouth as needed (for immune system support).   . baclofen (LIORESAL) 10 MG tablet Take 10 mg by mouth 3 (three) times daily.  . calcium carbonate (TUMS SMOOTHIES) 750 MG chewable tablet Chew 2-3 tablets by mouth 2 (two) times daily.  . camphor-menthol (SARNA) lotion Apply 1 application topically at bedtime as needed for itching.   . clobetasol ointment (TEMOVATE) 1.44 % Apply 1  application topically as needed (for eczema).   . clonazePAM (KLONOPIN) 0.5 MG tablet TAKE 1 TABLET BY MOUTH TWICE DAILY AS NEEDED  . DEXILANT 60 MG capsule TAKE 1 CAPSULE(60 MG) BY MOUTH TWICE DAILY  . diclofenac sodium (VOLTAREN) 1 % GEL APPLY THIN LAYER EXTERNALLY TO THE AFFECTED AREA UP TO FOUR TIMES DAILY AS NEEDED  . dronabinol (  MARINOL) 2.5 MG capsule Take 2.5 mg by mouth daily as needed (for nausea and pain).   Marland Kitchen escitalopram (LEXAPRO) 10 MG tablet TAKE 1 TABLET BY MOUTH EVERY DAY  . hyoscyamine (LEVSIN SL) 0.125 MG SL tablet DISSOLVE 1 TABLET(0.125 MG) UNDER THE TONGUE EVERY 4 HOURS AS NEEDED  . metroNIDAZOLE (METROGEL) 0.75 % gel Apply 1 application topically 2 (two) times daily.  . NON FORMULARY Place 1 drop under the tongue 3 (three) times daily.  . ondansetron (ZOFRAN) 4 MG tablet Take 1 tablet (4 mg total) by mouth every 8 (eight) hours as needed for nausea or vomiting.  Marland Kitchen Phenylephrine-DM-GG-APAP (MUCINEX SINUS-MAX) 5-10-200-325 MG CAPS Take 2 tablets by mouth 2 (two) times daily as needed (for migraine).   . tapentadol HCl (NUCYNTA) 75 MG tablet   . thyroid (ARMOUR) 60 MG tablet Take 60 mg by mouth daily before breakfast. Three days a week take extra tablet daily  . topiramate (TOPAMAX) 100 MG tablet Take 1 tablet (100 mg total) by mouth 2 (two) times daily.  . Cholecalciferol (VITAMIN D) 2000 units CAPS Take 1 capsule (2,000 Units total) by mouth daily. (Patient not taking: Reported on 10/12/2016)  . [DISCONTINUED] Tapentadol HCl (NUCYNTA) 100 MG TABS Take 100 mg by mouth every 8 (eight) hours.    Facility-Administered Encounter Medications as of 10/12/2016  Medication  . promethazine (PHENERGAN) tablet 25 mg    Activities of Daily Living In your present state of health, do you have any difficulty performing the following activities: 10/12/2016  Hearing? Y  Comment loud,ringing in both ears; worsens with migraines  Vision? Y  Comment left eye - blurred vision  Difficulty  concentrating or making decisions? Y  Comment memory loss related to migraines  Walking or climbing stairs? Y  Dressing or bathing? N  Doing errands, shopping? Y  Comment pt drives short distances only  Conservation officer, nature and eating ? Y  Using the Toilet? N  In the past six months, have you accidently leaked urine? Y  Do you have problems with loss of bowel control? Y  Managing your Medications? N  Managing your Finances? Y  Housekeeping or managing your Housekeeping? Y  Some recent data might be hidden    Patient Care Team: Ria Bush, MD as PCP - General (Family Medicine) Erline Levine, MD as Consulting Physician (Neurosurgery) Kary Kos, MD as Consulting Physician (Neurosurgery) Harold Hedge, Darrick Grinder, MD as Consulting Physician (Allergy and Immunology) Gabriel Carina Betsey Holiday, MD as Physician Assistant (Internal Medicine) Nicholaus Bloom, MD as Consulting Physician (Anesthesiology)    Assessment:     Hearing Screening   125Hz  250Hz  500Hz  1000Hz  2000Hz  3000Hz  4000Hz  6000Hz  8000Hz   Right ear:   40 40 40  40    Left ear:   40 40 40  40    Vision Screening Comments: Last vision exam on June 06, 2016 with Dr. Edison Pace   Exercise Activities and Dietary recommendations Current Exercise Habits: Home exercise routine, Type of exercise: stretching, Time (Minutes): 30, Frequency (Times/Week): 7, Weekly Exercise (Minutes/Week): 210, Intensity: Mild, Exercise limited by: orthopedic condition(s);neurologic condition(s)  Goals    . Increase physical activity          Starting 10/12/16, I will continue to do stretching 15 min twice daily.       Fall Risk Fall Risk  10/12/2016 10/02/2016 10/05/2015 07/15/2014  Falls in the past year? Yes Yes Yes Yes  Number falls in past yr: 2 or more 2 or more 1 2 or  more  Injury with Fall? No No No No  Risk Factor Category  High Fall Risk - - -  Risk for fall due to : Impaired balance/gait;Impaired mobility;History of fall(s) - - -  Follow up - Falls evaluation  completed Falls evaluation completed -   Depression Screen PHQ 2/9 Scores 10/12/2016 10/05/2015 07/15/2014  PHQ - 2 Score 5 4 4   PHQ- 9 Score 17 21 12      Cognitive Function MMSE - Mini Mental State Exam 10/12/2016 10/05/2015  Orientation to time 5 5  Orientation to Place 5 5  Registration 3 3  Attention/ Calculation 0 0  Recall 1 3  Recall-comments unable to recall 2 of 3 words -  Language- name 2 objects 0 0  Language- repeat 1 1  Language- follow 3 step command 3 3  Language- read & follow direction 0 0  Write a sentence 0 0  Copy design 0 0  Total score 18 20       PLEASE NOTE: A Mini-Cog screen was completed. Maximum score is 20. A value of 0 denotes this part of [place] MMSE was not completed or the patient failed this part of the Mini-Cog screening.   Mini-Cog Screening Orientation to Time - Max 5 pts Orientation to Place - Max 5 pts Registration - Max 3 pts Recall - Max 3 pts Language Repeat - Max 1 pts Language Follow 3 Step Command - Max 3 pts    There is no immunization history on file for this patient. Screening Tests Health Maintenance  Topic Date Due  . DTaP/Tdap/Td (1 - Tdap) 10/13/2026 (Originally [birth year]01/1984)  . TETANUS/TDAP  10/13/2026 (Originally [birth year]/01/1985)  . MAMMOGRAM  06/20/2018  . PAP SMEAR  06/26/2019  . COLONOSCOPY  10/03/2026  . HIV Screening  Completed      Plan:     I have personally reviewed and addressed the Medicare Annual Wellness questionnaire and have noted the following in the patient's chart:  A. Medical and social history B. Use of alcohol, tobacco or illicit drugs  C. Current medications and supplements D. Functional ability and status E.  Nutritional status F.  Physical activity G. Advance directives H. List of other physicians I.  Hospitalizations, surgeries, and ER visits in previous 12 months J.  [place] to include hearing, vision, cognitive, depression L. Referrals and appointments - none  In addition,  I have reviewed and discussed with patient certain preventive protocols, quality metrics, and best practice recommendations. A written personalized care plan for preventive services as well as general preventive health recommendations were provided to patient.  See attached scanned questionnaire for additional information.   Signed,   Lindell Noe, MHA, BS, LPN Health Coach

## 2016-10-15 ENCOUNTER — Ambulatory Visit
Admission: RE | Admit: 2016-10-15 | Discharge: 2016-10-15 | Disposition: A | Discharge status: Home / Self Care | Payer: Medicare Other | Source: Ambulatory Visit | Attending: Anesthesiology | Admitting: Anesthesiology

## 2016-10-15 VITALS — BP 86/68 | HR 70

## 2016-10-15 DIAGNOSIS — M542 Cervicalgia: Secondary | ICD-10-CM

## 2016-10-15 DIAGNOSIS — M5030 Other cervical disc degeneration, unspecified cervical region: Secondary | ICD-10-CM

## 2016-10-15 MED ORDER — ONDANSETRON HCL 4 MG/2ML IJ SOLN: 4.0000 mg | Freq: Four times a day (QID) | INTRAMUSCULAR | Status: DC | PRN

## 2016-10-15 MED ORDER — IOPAMIDOL (ISOVUE-M 300) INJECTION 61%
10.0000 mL | Freq: Once | INTRAMUSCULAR | Status: AC | PRN
  Administered 2016-10-15: 10 mL via INTRATHECAL

## 2016-10-15 MED ORDER — MEPERIDINE HCL 100 MG/ML IJ SOLN
100.0000 mg | Freq: Once | INTRAMUSCULAR | Status: AC
  Administered 2016-10-15: 100 mg via INTRAMUSCULAR

## 2016-10-15 MED ORDER — DIAZEPAM 5 MG PO TABS
10.0000 mg | ORAL_TABLET | Freq: Once | ORAL | Status: AC
  Administered 2016-10-15: 5 mg via ORAL

## 2016-10-15 MED ORDER — ONDANSETRON HCL 4 MG/2ML IJ SOLN
4.0000 mg | Freq: Once | INTRAMUSCULAR | Status: AC
  Administered 2016-10-15: 4 mg via INTRAMUSCULAR

## 2016-10-15 NOTE — Progress Notes (Signed)
Pt states she has been off Lexapro since Saturday.

## 2016-10-15 NOTE — Discharge Instructions (Signed)
Myelogram Discharge Instructions  1. Go home and rest quietly for the next 24 hours.  It is important to lie flat for the next 24 hours.  Get up only to go to the restroom.  You may lie in the bed or on a couch on your back, your stomach, your left side or your right side.  You may have one pillow under your head.  You may have pillows between your knees while you are on your side or under your knees while you are on your back.  2. DO NOT drive today.  Recline the seat as far back as it will go, while still wearing your seat belt, on the way home.  3. You may get up to go to the bathroom as needed.  You may sit up for 10 minutes to eat.  You may resume your normal diet and medications unless otherwise indicated.  Drink lots of extra fluids today and tomorrow.  4. The incidence of headache, nausea, or vomiting is about 5% (one in 20 patients).  If you develop a headache, lie flat and drink plenty of fluids until the headache goes away.  Caffeinated beverages may be helpful.  If you develop severe nausea and vomiting or a headache that does not go away with flat bed rest, call [phone].  5. You may resume normal activities after your 24 hours of bed rest is over; however, do not exert yourself strongly or do any heavy lifting tomorrow. If when you get up you have a headache when standing, go back to bed and force fluids for another 24 hours.  6. Call your physician for a follow-up appointment.  The results of your myelogram will be sent directly to your physician by the following day.  7. If you have any questions or if complications develop after you arrive home, please call [phone].  Discharge instructions have been explained to the patient.  The patient, or the person responsible for the patient, fully understands these instructions.       May resume Lexapro on Oct. 16, 2018, after 1:00 pm.

## 2016-10-15 NOTE — Progress Notes (Signed)
I reviewed health advisor's note, was available for consultation, and agree with documentation and plan.  

## 2016-10-17 ENCOUNTER — Encounter: Payer: Self-pay | Admitting: Family Medicine

## 2016-10-17 DIAGNOSIS — Z0289 Encounter for other administrative examinations: Secondary | ICD-10-CM

## 2016-10-19 ENCOUNTER — Encounter: Payer: Self-pay | Admitting: Neurology

## 2016-10-22 ENCOUNTER — Encounter: Payer: Self-pay | Admitting: Family Medicine

## 2016-10-22 ENCOUNTER — Encounter: Payer: Self-pay | Admitting: Neurology

## 2016-10-22 ENCOUNTER — Telehealth: Payer: Self-pay | Admitting: Neurology

## 2016-10-22 ENCOUNTER — Other Ambulatory Visit: Payer: Self-pay

## 2016-10-22 NOTE — Telephone Encounter (Signed)
New Message  Medication autho request was approved for Eletriptan 40 mg tablets for a year.  They'll be getting an approval letter and pt notified.

## 2016-10-23 DIAGNOSIS — G894 Chronic pain syndrome: Secondary | ICD-10-CM | POA: Diagnosis not present

## 2016-10-23 DIAGNOSIS — M5412 Radiculopathy, cervical region: Secondary | ICD-10-CM | POA: Diagnosis not present

## 2016-10-23 DIAGNOSIS — Z79891 Long term (current) use of opiate analgesic: Secondary | ICD-10-CM | POA: Diagnosis not present

## 2016-10-23 DIAGNOSIS — M961 Postlaminectomy syndrome, not elsewhere classified: Secondary | ICD-10-CM | POA: Diagnosis not present

## 2016-10-25 ENCOUNTER — Ambulatory Visit (INDEPENDENT_AMBULATORY_CARE_PROVIDER_SITE_OTHER): Payer: No Typology Code available for payment source | Admitting: Psychology

## 2016-10-25 DIAGNOSIS — F332 Major depressive disorder, recurrent severe without psychotic features: Secondary | ICD-10-CM

## 2016-10-25 DIAGNOSIS — F4321 Adjustment disorder with depressed mood: Secondary | ICD-10-CM | POA: Diagnosis not present

## 2016-10-30 ENCOUNTER — Other Ambulatory Visit: Payer: Self-pay | Admitting: Family Medicine

## 2016-10-30 NOTE — Telephone Encounter (Signed)
Last filled:  09/28/16, #60 Last OV:  10/08/16 Next OV:  none

## 2016-10-30 NOTE — Telephone Encounter (Signed)
plz phone in. 

## 2016-10-31 ENCOUNTER — Other Ambulatory Visit (INDEPENDENT_AMBULATORY_CARE_PROVIDER_SITE_OTHER): Payer: Self-pay | Admitting: Orthopaedic Surgery

## 2016-10-31 NOTE — Telephone Encounter (Signed)
Refill left on vm at pharmacy.  

## 2016-11-06 ENCOUNTER — Telehealth: Payer: Self-pay

## 2016-11-06 ENCOUNTER — Ambulatory Visit (HOSPITAL_COMMUNITY)
Admission: RE | Admit: 2016-11-06 | Discharge: 2016-11-06 | Disposition: A | Discharge status: Home / Self Care | Payer: Medicare Other | Source: Ambulatory Visit | Attending: Neurology | Admitting: Neurology

## 2016-11-06 DIAGNOSIS — R200 Anesthesia of skin: Secondary | ICD-10-CM | POA: Diagnosis not present

## 2016-11-06 DIAGNOSIS — G43009 Migraine without aura, not intractable, without status migrainosus: Secondary | ICD-10-CM | POA: Diagnosis not present

## 2016-11-06 DIAGNOSIS — R51 Headache: Secondary | ICD-10-CM | POA: Diagnosis not present

## 2016-11-06 NOTE — Telephone Encounter (Signed)
-----   Message from Pieter Partridge, DO sent at 11/06/2016  7:19 AM EST ----- Regarding: FW: ins precert Sandi, Please look into this ----- Message ----- From: Belva Chimes H Sent: 11/05/2016   4:52 PM To: Pieter Partridge, DO Subject: ins precert                                    pts ins precert is for the wrong location. It needs to be for [facility].. Thanks

## 2016-11-06 NOTE — Telephone Encounter (Signed)
Rcvd call from April at [facility] scheduling yesterday. Pts MRI  PA is approved for [facility], due to implanted stimulator in Pts back, it was changed to [facility]. Sent info to [facility], who had the location changed. Called April, she was already aware.

## 2016-11-08 ENCOUNTER — Telehealth: Payer: Self-pay

## 2016-11-08 ENCOUNTER — Ambulatory Visit (INDEPENDENT_AMBULATORY_CARE_PROVIDER_SITE_OTHER): Payer: No Typology Code available for payment source | Admitting: Psychology

## 2016-11-08 DIAGNOSIS — F332 Major depressive disorder, recurrent severe without psychotic features: Secondary | ICD-10-CM

## 2016-11-08 DIAGNOSIS — F4323 Adjustment disorder with mixed anxiety and depressed mood: Secondary | ICD-10-CM | POA: Diagnosis not present

## 2016-11-08 NOTE — Telephone Encounter (Signed)
-----   Message from Pieter Partridge, DO sent at 11/07/2016  6:57 AM EST ----- MRI of brain looks normal.  I have no explanation for continued left facial numbness.

## 2016-11-08 NOTE — Telephone Encounter (Signed)
Called and spoke with Pt, advsd her of MRI results. 

## 2016-11-20 DIAGNOSIS — Z79891 Long term (current) use of opiate analgesic: Secondary | ICD-10-CM | POA: Diagnosis not present

## 2016-11-20 DIAGNOSIS — M5412 Radiculopathy, cervical region: Secondary | ICD-10-CM | POA: Diagnosis not present

## 2016-11-20 DIAGNOSIS — G894 Chronic pain syndrome: Secondary | ICD-10-CM | POA: Diagnosis not present

## 2016-11-20 DIAGNOSIS — M961 Postlaminectomy syndrome, not elsewhere classified: Secondary | ICD-10-CM | POA: Diagnosis not present

## 2016-11-26 ENCOUNTER — Encounter: Payer: Self-pay | Admitting: Family Medicine

## 2016-11-29 NOTE — Telephone Encounter (Signed)
Scheduled CPE for 12/03/16 at 3:00 PM. Pt is aware.

## 2016-11-29 NOTE — Telephone Encounter (Signed)
plz call to schedule CPE. Will cc erin - see mychart message - any ideas how appt was cancelled?

## 2016-12-03 ENCOUNTER — Ambulatory Visit (INDEPENDENT_AMBULATORY_CARE_PROVIDER_SITE_OTHER): Payer: Medicare Other | Admitting: Family Medicine

## 2016-12-03 ENCOUNTER — Encounter: Payer: Self-pay | Admitting: Family Medicine

## 2016-12-03 VITALS — BP 118/76 | HR 83 | Temp 97.9°F | Ht 65.0 in | Wt 186.5 lb

## 2016-12-03 DIAGNOSIS — E7800 Pure hypercholesterolemia, unspecified: Secondary | ICD-10-CM

## 2016-12-03 DIAGNOSIS — G43109 Migraine with aura, not intractable, without status migrainosus: Secondary | ICD-10-CM | POA: Diagnosis not present

## 2016-12-03 DIAGNOSIS — F331 Major depressive disorder, recurrent, moderate: Secondary | ICD-10-CM

## 2016-12-03 DIAGNOSIS — E559 Vitamin D deficiency, unspecified: Secondary | ICD-10-CM | POA: Diagnosis not present

## 2016-12-03 DIAGNOSIS — Z0000 Encounter for general adult medical examination without abnormal findings: Secondary | ICD-10-CM

## 2016-12-03 DIAGNOSIS — G894 Chronic pain syndrome: Secondary | ICD-10-CM

## 2016-12-03 DIAGNOSIS — Z0001 Encounter for general adult medical examination with abnormal findings: Secondary | ICD-10-CM

## 2016-12-03 DIAGNOSIS — M79604 Pain in right leg: Secondary | ICD-10-CM

## 2016-12-03 DIAGNOSIS — R0989 Other specified symptoms and signs involving the circulatory and respiratory systems: Secondary | ICD-10-CM | POA: Diagnosis not present

## 2016-12-03 MED ORDER — CLONAZEPAM 0.5 MG PO TABS: 0.5000 mg | ORAL_TABLET | Freq: Two times a day (BID) | ORAL | 0 refills | Status: DC | PRN

## 2016-12-03 MED ORDER — PROMETHAZINE HCL 25 MG PO TABS: 25.0000 mg | ORAL_TABLET | Freq: Three times a day (TID) | ORAL | 0 refills | Status: DC | PRN

## 2016-12-03 MED ORDER — DULOXETINE HCL 20 MG PO CPEP: 20.0000 mg | ORAL_CAPSULE | Freq: Two times a day (BID) | ORAL | 6 refills | Status: DC

## 2016-12-03 NOTE — Progress Notes (Signed)
BP 118/76 (BP Location: Left Arm, Patient Position: Sitting, Cuff Size: Normal)   Pulse 83   Temp 97.9 F (36.6 C) (Oral)   Ht 5\' 5"  (1.651 m)   Wt 186 lb 8 oz (84.6 kg)   LMP 10/04/2014 Comment: per labs  SpO2 96%   BMI 31.04 kg/m    CC: CPE Subjective:    Patient ID: Karen Aguilar, female    DOB: 1965/07/26, 51 y.o.   MRN: [id number]  HPI: Karen Aguilar is a 51 y.o. female presenting on 12/03/2016 for Annual Exam (Pt 2) and Migraine (Could not afford Zofran. Requests rx phenergan)   Saw Katha Cabal last month for medicare wellness visit. Note reviewed.  Currently has migraine despite relpax this morning. zofran unaffordable - requests phenergan Rx. States we never filled PA for zofran - reviewing chart I don't see we ever received request.  Ongoing depression - longstanding treatment with lexapro 10mg  daily. Persistent and worsening depression despite this.   Ongoing concern over bladder spasms, muscle spasm, leg weakness R>L. Has had 1 fall. Chronic R leg paresthesias and numbness.   Started using essential oil supplements to help sleep which has helped. She also uses cold air humidifier. Feels improved sleep has helped her migraine control - has had 2 migraines in the past 30 days. Has not been keeping headache diary.   Preventative: EGD - esophageal stricture 10/2016 Gustavo Lah)  COLONOSCOPY 10/2016 rpt 10 yrs Gustavo Lah)  Well woman: Dr. Ritta Slot OBGYN. Normal pap 06/2016. Post-menopausal confirmed by hormone testing. Tried HRT by Dr Gabriel Carina with worsening pain levels.  Mammogram 06/2016 WNL report reviewed Flu shot - declines due to thimerosal concerns  Tetanus - declines due to coverage  Advanced planning - Received advanced directives 09/2014, scanned. HCPOA is Jimmy Picket then mother Harrie Jeans. Does not want life prolonging measures if terminally ill.  Seat belt use discussed  Sunscreen use discussed. No changing moles on skin.  Dentist - twice  yearly ophthalmologist - last seen 2017 - small cataract  No smoker  Alcohol - rarely  LMP sometime 2016 - hormone confirmed menopause   Lives with husband and 2 daughters (one in college) Occupation: was Public affairs consultant Disability since 2011 - arachnoiditis Edu: associate's degree Activity: severe limitations 2/2 arachnoiditis and chronic pain, walks to end of driveway and back  Diet: good water, fruits/vegetables daily   Relevant past medical, surgical, family and social history reviewed and updated as indicated. Interim medical history since our last visit reviewed. Allergies and medications reviewed and updated. Outpatient Medications Prior to Visit  Medication Sig Dispense Refill  . Ascorbic Acid (VITAMIN C PO) Take 1 tablet by mouth as needed (for immune system support).     . baclofen (LIORESAL) 10 MG tablet Take 10 mg by mouth 3 (three) times daily.    . calcium carbonate (TUMS SMOOTHIES) 750 MG chewable tablet Chew 2-3 tablets by mouth 2 (two) times daily.    . camphor-menthol (SARNA) lotion Apply 1 application topically at bedtime as needed for itching.     . Cholecalciferol (VITAMIN D) 2000 units CAPS Take 1 capsule (2,000 Units total) by mouth daily. 30 capsule   . clobetasol ointment (TEMOVATE) 6.23 % Apply 1 application topically as needed (for eczema).     . DEXILANT 60 MG capsule TAKE 1 CAPSULE(60 MG) BY MOUTH TWICE DAILY 60 capsule 3  . diclofenac sodium (VOLTAREN) 1 % GEL APPLY THIN LAYER EXTERNALLY TO THE AFFECTED AREA UP TO FOUR  TIMES DAILY AS NEEDED 300 g 0  . dronabinol (MARINOL) 2.5 MG capsule Take 2.5 mg by mouth daily as needed (for nausea and pain).   0  . eletriptan (RELPAX) 40 MG tablet Take 40 mg by mouth as needed for migraine or headache. May repeat in 2 hours if headache persists or recurs.    Marland Kitchen escitalopram (LEXAPRO) 10 MG tablet TAKE 1 TABLET BY MOUTH EVERY DAY 90 tablet 0  . hyoscyamine (LEVSIN SL) 0.125 MG SL tablet DISSOLVE 1 TABLET(0.125  MG) UNDER THE TONGUE EVERY 4 HOURS AS NEEDED 30 tablet 0  . metroNIDAZOLE (METROGEL) 0.75 % gel Apply 1 application topically 2 (two) times daily. 45 g 3  . NON FORMULARY Place 1 drop under the tongue 3 (three) times daily.    Marland Kitchen Phenylephrine-DM-GG-APAP (MUCINEX SINUS-MAX) 5-10-200-325 MG CAPS Take 2 tablets by mouth 2 (two) times daily as needed (for migraine).     . tapentadol HCl (NUCYNTA) 75 MG tablet     . thyroid (ARMOUR) 60 MG tablet Take 60 mg by mouth daily before breakfast. Three days a week take extra tablet daily    . topiramate (TOPAMAX) 100 MG tablet Take 1 tablet (100 mg total) by mouth daily.    . clonazePAM (KLONOPIN) 0.5 MG tablet TAKE 1 TABLET BY MOUTH TWICE DAILY AS NEEDED 60 tablet 0  . topiramate (TOPAMAX) 100 MG tablet Take 1 tablet (100 mg total) by mouth 2 (two) times daily. 180 tablet 1  . ondansetron (ZOFRAN) 4 MG tablet Take 1 tablet (4 mg total) by mouth every 8 (eight) hours as needed for nausea or vomiting. (Patient not taking: Reported on 12/03/2016) 20 tablet 0   No facility-administered medications prior to visit.      Per HPI unless specifically indicated in ROS section below Review of Systems  Constitutional: Positive for appetite change (topamax related). Negative for activity change, chills, fatigue, fever and unexpected weight change.       Hot flashes  HENT: Negative for hearing loss.   Eyes: Negative for visual disturbance.  Respiratory: Negative for cough, chest tightness, shortness of breath and wheezing.   Cardiovascular: Positive for chest pain (attributed to esophageal spasms). Negative for palpitations and leg swelling.  Gastrointestinal: Positive for blood in stool (blood with wiping), constipation, nausea (migraine related) and vomiting. Negative for abdominal distention, abdominal pain and diarrhea.  Genitourinary: Negative for difficulty urinating and hematuria.  Musculoskeletal: Negative for arthralgias, myalgias and neck pain.  Skin:  Negative for rash.  Neurological: Positive for headaches (migraines). Negative for dizziness, seizures and syncope.  Hematological: Negative for adenopathy. Does not bruise/bleed easily.  Psychiatric/Behavioral: Positive for dysphoric mood. The patient is nervous/anxious.        Objective:    BP 118/76 (BP Location: Left Arm, Patient Position: Sitting, Cuff Size: Normal)   Pulse 83   Temp 97.9 F (36.6 C) (Oral)   Ht 5\' 5"  (1.651 m)   Wt 186 lb 8 oz (84.6 kg)   LMP 10/04/2014 Comment: per labs  SpO2 96%   BMI 31.04 kg/m   Wt Readings from Last 3 Encounters:  12/03/16 186 lb 8 oz (84.6 kg)  10/12/16 188 lb 12 oz (85.6 kg)  10/08/16 187 lb 12 oz (85.2 kg)    Physical Exam  Constitutional: She is oriented to person, place, and time. She appears well-developed and well-nourished. No distress.  HENT:  Head: Normocephalic and atraumatic.  Right Ear: Hearing, tympanic membrane, external ear and ear canal normal.  Left Ear: Hearing, tympanic membrane, external ear and ear canal normal.  Nose: Nose normal.  Mouth/Throat: Uvula is midline, oropharynx is clear and moist and mucous membranes are normal. No oropharyngeal exudate, posterior oropharyngeal edema or posterior oropharyngeal erythema.  Eyes: Conjunctivae and EOM are normal. Pupils are equal, round, and reactive to light. No scleral icterus.  Neck: Normal range of motion. Neck supple. No thyromegaly present.  Cardiovascular: Normal rate, regular rhythm, normal heart sounds and intact distal pulses.  No murmur heard. Pulses:      Radial pulses are 2+ on the right side, and 2+ on the left side.  Pulmonary/Chest: Effort normal and breath sounds normal. No respiratory distress. She has no wheezes. She has no rales.  Abdominal: Soft. Bowel sounds are normal. She exhibits no distension and no mass. There is no tenderness. There is no rebound and no guarding.  Musculoskeletal: Normal range of motion. She exhibits no edema.  Diminished  pulses RLE 2+ LLE LLE calf circ: 34.5cm RLE calf circ: 33cm  Lymphadenopathy:    She has no cervical adenopathy.  Neurological: She is alert and oriented to person, place, and time.  CN grossly intact, station and gait intact  Skin: Skin is warm and dry. No rash noted.  Psychiatric: She has a normal mood and affect. Her behavior is normal. Judgment and thought content normal.  Nursing note and vitals reviewed.  Results for orders placed or performed in visit on 10/12/16  Lipid panel  Result Value Ref Range   Cholesterol 239 (H) 0 - 200 mg/dL   Triglycerides 125.0 0.0 - 149.0 mg/dL   HDL 50.50 >39.00 mg/dL   VLDL 25.0 0.0 - 40.0 mg/dL   LDL Cholesterol 164 (H) 0 - 99 mg/dL   Total CHOL/HDL Ratio 5    NonHDL 188.72   Comprehensive metabolic panel  Result Value Ref Range   Sodium 141 135 - 145 mEq/L   Potassium 4.7 3.5 - 5.1 mEq/L   Chloride 105 96 - 112 mEq/L   CO2 30 19 - 32 mEq/L   Glucose, Bld 111 (H) 70 - 99 mg/dL   BUN 11 6 - 23 mg/dL   Creatinine, Ser 1.04 0.40 - 1.20 mg/dL   Total Bilirubin 0.3 0.2 - 1.2 mg/dL   Alkaline Phosphatase 62 39 - 117 U/L   AST 12 0 - 37 U/L   ALT 15 0 - 35 U/L   Total Protein 6.8 6.0 - 8.3 g/dL   Albumin 4.3 3.5 - 5.2 g/dL   Calcium 9.3 8.4 - 10.5 mg/dL   GFR 59.51 (L) >60.00 mL/min  TSH  Result Value Ref Range   TSH 0.91 0.35 - 4.50 uIU/mL  T4, free  Result Value Ref Range   Free T4 0.61 0.60 - 1.60 ng/dL  VITAMIN D 25 Hydroxy (Vit-D Deficiency, Fractures)  Result Value Ref Range   VITD 28.09 (L) 30.00 - 100.00 ng/mL      Assessment & Plan:   Problem List Items Addressed This Visit    Chronic pain syndrome   Complicated migraine    Improved over the past month. Currently with headache. zofran unaffordable. Will Rx phenergan. She decreased topamax to 100mg  once daily, feels doing better on lower dose. Will continue this ppx.       Relevant Medications   eletriptan (RELPAX) 40 MG tablet   clonazePAM (KLONOPIN) 0.5 MG tablet    topiramate (TOPAMAX) 100 MG tablet   DULoxetine (CYMBALTA) 20 MG capsule   Health maintenance examination -  Primary    Preventative protocols reviewed and updated unless pt declined. Discussed healthy diet and lifestyle.       Hyperlipidemia    Chronic. Reviewed diet changes to improve lipid levels.       MDD (major depressive disorder), recurrent episode, moderate (HCC)    Deteriorated. Previous success with cymbalta, desires retrial. Will cross taper off lexapro and onto cymbalta. Discussed psychiatry referral option if not effective.       Relevant Medications   DULoxetine (CYMBALTA) 20 MG capsule   Right leg pain    Chronic. Diminished pulses noted today. Will check ABI.       Relevant Orders   VAS [place] LE ART SEG MULTI (Segm&LE [facility]aud[facility])   Vitamin D deficiency    She had stopped vitamin D, but has now restarted.        Other Visit Diagnoses    Diminished pulses in lower extremity       Relevant Orders   VAS [place] LE ART SEG MULTI (Segm&LE [facility]auds)       Follow up plan: Return in about 3 months (around 03/03/2017) for follow up visit.  Ria Bush, MD

## 2016-12-03 NOTE — Patient Instructions (Addendum)
Let's cross taper off lexapro onto cymbalta. Decrease lexapro to 1/2 tablet daily for 1 week and at the same time start cymbalta '20mg'$  daily. After 1 week may stop lexapro and increase cymbalta to '40mg'$  daily.  After 1 month update me with effect. Consider psychiatry evaluation for further medication management.  We will refer you for ABI arterial evaluation of legs to ensure good circulation.  Phenergan refilled today.  Return in 3 months for follow up visit.  Health Maintenance, Female Adopting a healthy lifestyle and getting preventive care can go a long way to promote health and wellness. Talk with your health care provider about what schedule of regular examinations is right for you. This is a good chance for you to check in with your provider about disease prevention and staying healthy. In between checkups, there are plenty of things you can do on your own. Experts have done a lot of research about which lifestyle changes and preventive measures are most likely to keep you healthy. Ask your health care provider for more information. Weight and diet Eat a healthy diet  Be sure to include plenty of vegetables, fruits, low-fat dairy products, and lean protein.  Do not eat a lot of foods high in solid fats, added sugars, or salt.  Get regular exercise. This is one of the most important things you can do for your health. ? Most adults should exercise for at least 150 minutes each week. The exercise should increase your heart rate and make you sweat (moderate-intensity exercise). ? Most adults should also do strengthening exercises at least twice a week. This is in addition to the moderate-intensity exercise.  Maintain a healthy weight  Body mass index (BMI) is a measurement that can be used to identify possible weight problems. It estimates body fat based on height and weight. Your health care provider can help determine your BMI and help you achieve or maintain a healthy weight.  For females  71 years of age and older: ? A BMI below 18.5 is considered underweight. ? A BMI of 18.5 to 24.9 is normal. ? A BMI of 25 to 29.9 is considered overweight. ? A BMI of 30 and above is considered obese.  Watch levels of cholesterol and blood lipids  You should start having your blood tested for lipids and cholesterol at 50 years of age, then have this test every 5 years.  You may need to have your cholesterol levels checked more often if: ? Your lipid or cholesterol levels are high. ? You are older than 50 years of age. ? You are at high risk for heart disease.  Cancer screening Lung Cancer  Lung cancer screening is recommended for adults 50-86 years old who are at high risk for lung cancer because of a history of smoking.  A yearly low-dose CT scan of the lungs is recommended for people who: ? Currently smoke. ? Have quit within the past 15 years. ? Have at least a 30-pack-year history of smoking. A pack year is smoking an average of one pack of cigarettes a day for 1 year.  Yearly screening should continue until it has been 15 years since you quit.  Yearly screening should stop if you develop a health problem that would prevent you from having lung cancer treatment.  Breast Cancer  Practice breast self-awareness. This means understanding how your breasts normally appear and feel.  It also means doing regular breast self-exams. Let your health care provider know about any changes, no matter  how small.  If you are in your 20s or 30s, you should have a clinical breast exam (CBE) by a health care provider every 1-3 years as part of a regular health exam.  If you are 22 or older, have a CBE every year. Also consider having a breast X-ray (mammogram) every year.  If you have a family history of breast cancer, talk to your health care provider about genetic screening.  If you are at high risk for breast cancer, talk to your health care provider about having an MRI and a mammogram  every year.  Breast cancer gene (BRCA) assessment is recommended for women who have family members with BRCA-related cancers. BRCA-related cancers include: ? Breast. ? Ovarian. ? Tubal. ? Peritoneal cancers.  Results of the assessment will determine the need for genetic counseling and BRCA1 and BRCA2 testing.  Cervical Cancer Your health care provider may recommend that you be screened regularly for cancer of the pelvic organs (ovaries, uterus, and vagina). This screening involves a pelvic examination, including checking for microscopic changes to the surface of your cervix (Pap test). You may be encouraged to have this screening done every 3 years, beginning at age 51.  For women ages 68-65, health care providers may recommend pelvic exams and Pap testing every 3 years, or they may recommend the Pap and pelvic exam, combined with testing for human papilloma virus (HPV), every 5 years. Some types of HPV increase your risk of cervical cancer. Testing for HPV may also be done on women of any age with unclear Pap test results.  Other health care providers may not recommend any screening for nonpregnant women who are considered low risk for pelvic cancer and who do not have symptoms. Ask your health care provider if a screening pelvic exam is right for you.  If you have had past treatment for cervical cancer or a condition that could lead to cancer, you need Pap tests and screening for cancer for at least 20 years after your treatment. If Pap tests have been discontinued, your risk factors (such as having a new sexual partner) need to be reassessed to determine if screening should resume. Some women have medical problems that increase the chance of getting cervical cancer. In these cases, your health care provider may recommend more frequent screening and Pap tests.  Colorectal Cancer  This type of cancer can be detected and often prevented.  Routine colorectal cancer screening usually begins at  50 years of age and continues through 50 years of age.  Your health care provider may recommend screening at an earlier age if you have risk factors for colon cancer.  Your health care provider may also recommend using home test kits to check for hidden blood in the stool.  A small camera at the end of a tube can be used to examine your colon directly (sigmoidoscopy or colonoscopy). This is done to check for the earliest forms of colorectal cancer.  Routine screening usually begins at age 53.  Direct examination of the colon should be repeated every 5-10 years through 50 years of age. However, you may need to be screened more often if early forms of precancerous polyps or small growths are found.  Skin Cancer  Check your skin from head to toe regularly.  Tell your health care provider about any new moles or changes in moles, especially if there is a change in a mole's shape or color.  Also tell your health care provider if you have a  mole that is larger than the size of a pencil eraser.  Always use sunscreen. Apply sunscreen liberally and repeatedly throughout the day.  Protect yourself by wearing long sleeves, pants, a wide-brimmed hat, and sunglasses whenever you are outside.  Heart disease, diabetes, and high blood pressure  High blood pressure causes heart disease and increases the risk of stroke. High blood pressure is more likely to develop in: ? People who have blood pressure in the high end of the normal range (130-139/85-89 mm Hg). ? People who are overweight or obese. ? People who are African American.  If you are 59-74 years of age, have your blood pressure checked every 3-5 years. If you are 54 years of age or older, have your blood pressure checked every year. You should have your blood pressure measured twice-once when you are at a hospital or clinic, and once when you are not at a hospital or clinic. Record the average of the two measurements. To check your blood  pressure when you are not at a hospital or clinic, you can use: ? An automated blood pressure machine at a pharmacy. ? A home blood pressure monitor.  If you are between 63 years and 85 years old, ask your health care provider if you should take aspirin to prevent strokes.  Have regular diabetes screenings. This involves taking a blood sample to check your fasting blood sugar level. ? If you are at a normal weight and have a low risk for diabetes, have this test once every three years after 50 years of age. ? If you are overweight and have a high risk for diabetes, consider being tested at a younger age or more often. Preventing infection Hepatitis B  If you have a higher risk for hepatitis B, you should be screened for this virus. You are considered at high risk for hepatitis B if: ? You were born in a country where hepatitis B is common. Ask your health care provider which countries are considered high risk. ? Your parents were born in a high-risk country, and you have not been immunized against hepatitis B (hepatitis B vaccine). ? You have HIV or AIDS. ? You use needles to inject street drugs. ? You live with someone who has hepatitis B. ? You have had sex with someone who has hepatitis B. ? You get hemodialysis treatment. ? You take certain medicines for conditions, including cancer, organ transplantation, and autoimmune conditions.  Hepatitis C  Blood testing is recommended for: ? Everyone born from [birth year] through 1964. ? Anyone with known risk factors for hepatitis C.  Sexually transmitted infections (STIs)  You should be screened for sexually transmitted infections (STIs) including gonorrhea and chlamydia if: ? You are sexually active and are younger than 49 years of age. ? You are older than 50 years of age and your health care provider tells you that you are at risk for this type of infection. ? Your sexual activity has changed since you were last screened and you are at an  increased risk for chlamydia or gonorrhea. Ask your health care provider if you are at risk.  If you do not have HIV, but are at risk, it may be recommended that you take a prescription medicine daily to prevent HIV infection. This is called pre-exposure prophylaxis (PrEP). You are considered at risk if: ? You are sexually active and do not regularly use condoms or know the HIV status of your partner(s). ? You take drugs by injection. ? You  are sexually active with a partner who has HIV.  Talk with your health care provider about whether you are at high risk of being infected with HIV. If you choose to begin PrEP, you should first be tested for HIV. You should then be tested every 3 months for as long as you are taking PrEP. Pregnancy  If you are premenopausal and you may become pregnant, ask your health care provider about preconception counseling.  If you may become pregnant, take 400 to 800 micrograms (mcg) of folic acid every day.  If you want to prevent pregnancy, talk to your health care provider about birth control (contraception). Osteoporosis and menopause  Osteoporosis is a disease in which the bones lose minerals and strength with aging. This can result in serious bone fractures. Your risk for osteoporosis can be identified using a bone density scan.  If you are 22 years of age or older, or if you are at risk for osteoporosis and fractures, ask your health care provider if you should be screened.  Ask your health care provider whether you should take a calcium or vitamin D supplement to lower your risk for osteoporosis.  Menopause may have certain physical symptoms and risks.  Hormone replacement therapy may reduce some of these symptoms and risks. Talk to your health care provider about whether hormone replacement therapy is right for you. Follow these instructions at home:  Schedule regular health, dental, and eye exams.  Stay current with your immunizations.  Do not use  any tobacco products including cigarettes, chewing tobacco, or electronic cigarettes.  If you are pregnant, do not drink alcohol.  If you are breastfeeding, limit how much and how often you drink alcohol.  Limit alcohol intake to no more than 1 drink per day for nonpregnant women. One drink equals 12 ounces of beer, 5 ounces of wine, or 1 ounces of hard liquor.  Do not use street drugs.  Do not share needles.  Ask your health care provider for help if you need support or information about quitting drugs.  Tell your health care provider if you often feel depressed.  Tell your health care provider if you have ever been abused or do not feel safe at home. This information is not intended to replace advice given to you by your health care provider. Make sure you discuss any questions you have with your health care provider. Document Released: 07/03/2010 Document Revised: 05/26/2015 Document Reviewed: 09/21/2014 Elsevier Interactive Patient Education  [employer].

## 2016-12-06 ENCOUNTER — Ambulatory Visit: Payer: No Typology Code available for payment source | Admitting: Psychology

## 2016-12-06 DIAGNOSIS — F332 Major depressive disorder, recurrent severe without psychotic features: Secondary | ICD-10-CM | POA: Diagnosis not present

## 2016-12-06 DIAGNOSIS — F4321 Adjustment disorder with depressed mood: Secondary | ICD-10-CM | POA: Diagnosis not present

## 2016-12-06 DIAGNOSIS — M79604 Pain in right leg: Secondary | ICD-10-CM | POA: Insufficient documentation

## 2016-12-06 NOTE — Assessment & Plan Note (Signed)
Preventative protocols reviewed and updated unless pt declined. Discussed healthy diet and lifestyle.  

## 2016-12-06 NOTE — Assessment & Plan Note (Addendum)
Improved over the past month. Currently with headache. zofran unaffordable. Will Rx phenergan. She decreased topamax to 100mg  once daily, feels doing better on lower dose. Will continue this ppx.

## 2016-12-06 NOTE — Assessment & Plan Note (Addendum)
Deteriorated. Previous success with cymbalta, desires retrial. Will cross taper off lexapro and onto cymbalta. Discussed psychiatry referral option if not effective.

## 2016-12-06 NOTE — Assessment & Plan Note (Signed)
Chronic. Diminished pulses noted today. Will check ABI.

## 2016-12-06 NOTE — Assessment & Plan Note (Signed)
She had stopped vitamin D, but has now restarted.

## 2016-12-06 NOTE — Assessment & Plan Note (Signed)
Chronic. Reviewed diet changes to improve lipid levels.

## 2016-12-11 DIAGNOSIS — K5900 Constipation, unspecified: Secondary | ICD-10-CM | POA: Diagnosis not present

## 2016-12-11 DIAGNOSIS — K222 Esophageal obstruction: Secondary | ICD-10-CM | POA: Diagnosis not present

## 2016-12-11 DIAGNOSIS — K219 Gastro-esophageal reflux disease without esophagitis: Secondary | ICD-10-CM | POA: Diagnosis not present

## 2016-12-11 DIAGNOSIS — R1310 Dysphagia, unspecified: Secondary | ICD-10-CM | POA: Diagnosis not present

## 2016-12-12 ENCOUNTER — Encounter: Payer: Self-pay | Admitting: Family Medicine

## 2016-12-18 DIAGNOSIS — M5412 Radiculopathy, cervical region: Secondary | ICD-10-CM | POA: Diagnosis not present

## 2016-12-18 DIAGNOSIS — Z79891 Long term (current) use of opiate analgesic: Secondary | ICD-10-CM | POA: Diagnosis not present

## 2016-12-18 DIAGNOSIS — G894 Chronic pain syndrome: Secondary | ICD-10-CM | POA: Diagnosis not present

## 2016-12-18 DIAGNOSIS — M961 Postlaminectomy syndrome, not elsewhere classified: Secondary | ICD-10-CM | POA: Diagnosis not present

## 2016-12-20 ENCOUNTER — Ambulatory Visit (INDEPENDENT_AMBULATORY_CARE_PROVIDER_SITE_OTHER): Payer: No Typology Code available for payment source | Admitting: Psychology

## 2016-12-20 DIAGNOSIS — F332 Major depressive disorder, recurrent severe without psychotic features: Secondary | ICD-10-CM

## 2016-12-20 DIAGNOSIS — F4321 Adjustment disorder with depressed mood: Secondary | ICD-10-CM

## 2016-12-24 ENCOUNTER — Ambulatory Visit: Payer: Self-pay | Admitting: Psychology

## 2016-12-26 ENCOUNTER — Ambulatory Visit: Payer: Self-pay | Admitting: Psychology

## 2016-12-28 ENCOUNTER — Telehealth: Payer: Self-pay | Admitting: *Deleted

## 2016-12-28 ENCOUNTER — Ambulatory Visit (INDEPENDENT_AMBULATORY_CARE_PROVIDER_SITE_OTHER): Payer: Medicare Other

## 2016-12-28 ENCOUNTER — Ambulatory Visit: Payer: Self-pay | Admitting: Psychology

## 2016-12-28 DIAGNOSIS — R0989 Other specified symptoms and signs involving the circulatory and respiratory systems: Secondary | ICD-10-CM

## 2016-12-28 DIAGNOSIS — M79604 Pain in right leg: Secondary | ICD-10-CM | POA: Diagnosis not present

## 2016-12-28 NOTE — Telephone Encounter (Signed)
Patient in our office today for a ABI's per Dr. Danise Mina.  Asked by the vascular tech to come evaluate the patient due to complaints of chest pain. In speaking with the patient, she has had intermittent chest tightness under her left breast since the spring of this year.  Today, she woke up with tightness under her left breast that radiated up to her left arm that was much more intense than her usual and lasted longer (about 1- 1.5 minutes).  No personal history of heart disease- she does not know anything about her father/ his side of the family- mother has a cousin with heart issues.  She does have a history of severe acid reflux, but this usually causes her to have spasms in the middle of her chest. Per her report, the feelings she had today were very different than when she has acid reflux.  She does report taking Relpax yesterday for a migraine.  She is currently symptom free.  Reviewed patient symptoms and side effects of relpax with Christell Faith, PA.  Per Thurmond Butts: Relpax and cause vasospasm and this may be what she was experiencing-   since she is pain free, can offer follow up with one of our cardiologist, hold off on relpax for now until she can discuss with her neurologist if there is an alternative she can take.  I have notified the patient of the above and she voices understanding.  She is also aware if symptoms return/ persist/ she develops nausea, SOB, diaphoresis with these, she should report to the ER. She is agreeable.

## 2016-12-29 ENCOUNTER — Encounter: Payer: Self-pay | Admitting: Family Medicine

## 2016-12-31 ENCOUNTER — Ambulatory Visit: Payer: Self-pay | Admitting: Psychology

## 2017-01-02 ENCOUNTER — Ambulatory Visit: Payer: Self-pay | Admitting: Psychology

## 2017-01-02 ENCOUNTER — Other Ambulatory Visit: Payer: Self-pay | Admitting: Family Medicine

## 2017-01-03 ENCOUNTER — Ambulatory Visit: Payer: No Typology Code available for payment source | Admitting: Psychology

## 2017-01-03 DIAGNOSIS — F4321 Adjustment disorder with depressed mood: Secondary | ICD-10-CM

## 2017-01-03 DIAGNOSIS — F332 Major depressive disorder, recurrent severe without psychotic features: Secondary | ICD-10-CM

## 2017-01-03 NOTE — Telephone Encounter (Signed)
Last filled:  12/04/16, #60 Last OV (CPE):  12/03/16 Next OV:  02/12/17

## 2017-01-04 ENCOUNTER — Other Ambulatory Visit: Payer: Self-pay | Admitting: Family Medicine

## 2017-01-04 ENCOUNTER — Ambulatory Visit: Payer: Self-pay | Admitting: Psychology

## 2017-01-04 NOTE — Telephone Encounter (Signed)
Sent electronically 

## 2017-01-07 ENCOUNTER — Ambulatory Visit: Payer: Self-pay | Admitting: Psychology

## 2017-01-09 ENCOUNTER — Ambulatory Visit: Payer: Self-pay | Admitting: Psychology

## 2017-01-11 ENCOUNTER — Ambulatory Visit: Payer: Self-pay | Admitting: Psychology

## 2017-01-14 ENCOUNTER — Ambulatory Visit: Payer: Self-pay | Admitting: Psychology

## 2017-01-16 ENCOUNTER — Ambulatory Visit: Payer: Self-pay | Admitting: Psychology

## 2017-01-17 ENCOUNTER — Ambulatory Visit: Payer: No Typology Code available for payment source | Admitting: Psychology

## 2017-01-17 DIAGNOSIS — Z79891 Long term (current) use of opiate analgesic: Secondary | ICD-10-CM | POA: Diagnosis not present

## 2017-01-17 DIAGNOSIS — G894 Chronic pain syndrome: Secondary | ICD-10-CM | POA: Diagnosis not present

## 2017-01-17 DIAGNOSIS — F4321 Adjustment disorder with depressed mood: Secondary | ICD-10-CM

## 2017-01-17 DIAGNOSIS — F332 Major depressive disorder, recurrent severe without psychotic features: Secondary | ICD-10-CM

## 2017-01-17 DIAGNOSIS — M961 Postlaminectomy syndrome, not elsewhere classified: Secondary | ICD-10-CM | POA: Diagnosis not present

## 2017-01-17 DIAGNOSIS — M5412 Radiculopathy, cervical region: Secondary | ICD-10-CM | POA: Diagnosis not present

## 2017-01-18 ENCOUNTER — Encounter: Payer: Self-pay | Admitting: Neurology

## 2017-01-18 ENCOUNTER — Ambulatory Visit: Payer: Medicare Other | Admitting: Neurology

## 2017-01-18 VITALS — BP 104/80 | HR 88 | Ht 67.5 in | Wt 184.0 lb

## 2017-01-18 DIAGNOSIS — M5412 Radiculopathy, cervical region: Secondary | ICD-10-CM

## 2017-01-18 DIAGNOSIS — G43719 Chronic migraine without aura, intractable, without status migrainosus: Secondary | ICD-10-CM | POA: Diagnosis not present

## 2017-01-18 MED ORDER — DICLOFENAC POTASSIUM(MIGRAINE) 50 MG PO PACK: 50.0000 mg | PACK | ORAL | 0 refills | Status: DC

## 2017-01-18 MED ORDER — DICLOFENAC POTASSIUM(MIGRAINE) 50 MG PO PACK: 50.0000 mg | PACK | ORAL | 3 refills | Status: DC

## 2017-01-18 NOTE — Progress Notes (Signed)
NEUROLOGY FOLLOW UP OFFICE NOTE  Karen Aguilar [id number]  HISTORY OF PRESENT ILLNESS: Karen Aguilar is a 52 year old right-handed woman with degenerative cervical and lumbar disc disease with residual neuropathy status post MRI-compatible spinal cord stimulator and neurogenic bladder, hypothyroidism, depression, anxiety, IBS/gastroparesis and who follows up for migraines.     UPDATE: Due to persistent left sided facial numbness, MRI of brain was performed on 11/06/16, which was personally reviewed and was normal.  In addition to the left facial numbness, she frequently endorses monocular triple vision in the left eye, not associated with migraine.  She continues to have neck pain with shooting pain down the right arm, radiating to the last 3 fingers.  She had a CT myelogram of the cervical spine on 10/15/16, which demonstrated C4-C6 ADCF without stenosis, mild spurring at C2-3 without stenosis and unchanged moderate right neural foraminal stenosis at C7-T1 with possible C8 nerve root impingement. Epidural injection was ineffective.  Intensity:  severe Duration:  1 to 3 days Frequency:  3 to 4 times a month. Current NSAIDS:  Aleve (it hasn't bothered her stomach) Current analgesics:  Nucynta Current triptans:  None (stopped Relpax 40mg  due to chest pain) Current anti-emetic:  Promethazine 25mg  Current muscle relaxants:  baclofen Current anti-anxiolytic:  Klonopin Current sleep aide:  no Current Antihypertensive medications:  no Current Antidepressant medications:  Cymbalta 20mg  twice daily Current Anticonvulsant medications:  topiramate 100mg  once daily (twice daily caused cognitive changes and attributes constipation to it). Current Vitamins/Herbal/Supplements:  magnesium, Hemp oil Current Antihistamines/Decongestants:  no Other therapy:  no   Caffeine:  At least one cup of coffee in AM Alcohol:  no Smoker:  no Diet:  hydrates Exercise:  No (due to  pain) Depression/anxiety:  Significant depression and anxiety due to her pain and limitations Sleep hygiene:  Poor (due to pain)   HISTORY: She has a complicated history.  She has had 2 cervical spine surgeries and 12 lumbar spine surgeries.  She has chronic neck pain with radicular pain radiating down the right arm to the last 3 fingers.  She has pain and numbness radiating down the posterior thigh, lateral lower leg and lateral foot.  She has neurogenic bladder reportedly due to her spine pathology.  She also has gastroparesis.  GI believes it may be due to chronic opioid use.  She has significant depression and anxiety.  She had a spinal cord stimulator inserted.   Onset:  teenager Location:  Band-like Quality:  Squeezing/pounding Initial Intensity:  4-10/10 Aura:  no Prodrome:  no Postdrome:  no Associated symptoms:  Nausea, photophobia, phonophobia, osmophobia, sometimes vomiting.  On 06/01/16, she had a migraine associated with left facial numbness.  CT of head was personally reviewed and was unremarkable.  She still has the left facial numbness off and on.  She has not had any new worse headache of her life, waking up from sleep Initial Duration:  2 days to over a month Initial Frequency:  Varies.  She can have daily migraines for over a month and then go a month without migraines.  Since April, she has had a migraine about 20 days out of the month. Frequency of abortive medication: takes Nucynta daily for chronic pain. Triggers/exacerbating factors:  no Relieving factors:  no Activity:  Cannot function   Past NSAIDS:  Contraindicated due to gastroparesis Past analgesics:  Excedrin Past abortive triptans:  Sumatriptan, rizatriptan (made headache worse), Relpax (chest pain) Past muscle relaxants:  tizanidine Past anti-emetic:  Reglan Past antihypertensive medications:  no Past antidepressant medications:  no Past anticonvulsant medications:  Gabapentin (side effects) Past  vitamins/Herbal/Supplements:  no Past antihistamines/decongestants:  no Other past therapies:  Trigger point injections, biofeedback, acupuncture   Family history of headache:  no   CT Head from 6//1/18 personally reviewed (following headache associated with left facial numbness), which revealed no acute abnormalities. MRI of brain without contrast from 10/01/14 (to evaluate upper extremity weakness and difficulty walking) was personally reviewed and was unremarkable. MRI cervical spine report from 03/22/16 revealed:  "1.  Stable MRI appearance of cervical spine since June 2016.  2.  Previous C4-C5 and C5-C6 ACDF. 3. Right eccentric chronic disc and endplate degeneration at both the adjacent segments, and chronic right facet and endplate spurring at F7-T0.  4.  No associated spinal stenosis.  Mild right C4, moderate right C7 and moderate to severe right C8 level neural foraminal stenosis appears stable since 2016."  PAST MEDICAL HISTORY: Past Medical History:  Diagnosis Date  . Abnormal MRI scan, bone 2012   abnormal marrow signal humeral head, glenoid and scapula - diffuse replacement of fatty marrow - likely benign, no need for further investigation ([place])  . Agnosia for temperature    Right Leg  . Anemia    etiology unknown-iron infusion in past,last 9/12  . Anxiety   . Anxiety and depression   . 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 Arthritis   . Chronic pain    pain contract with Dr. Eartha Inch Pain  . DDD (degenerative disc disease), cervical   . DDD (degenerative disc disease), lumbar    s/p permanent nerve damage after back surgery  . Depression   . Depression with anxiety   . Diverticulosis   . Eczema   . Family history of adverse reaction to anesthesia    Mother - nausea  . Fibromyalgia   . Foot drop    right - numbness, tingling - twisting  . Gallstones   . GERD (gastroesophageal reflux disease)    rare zantac PRN N/V at times with this  . GERD  (gastroesophageal reflux disease)   . Headache(784.0)    migraines  . Hemorrhoid   . Hemorrhoids   . History of kidney stones   . Hyperlipidemia   . Hypothyroidism 1990s   hashimoto's thyroiditis  . IBS (irritable bowel syndrome)   . IBS (irritable bowel syndrome)   . Incontinence of urine    s/p sling procedure-unresolved  . Iron deficiency    h/o anemia, s/p iron infusion (2012)  . Liver hemangioma     x2  . Memory change    due to medications  . Migraines    and h/o optical migraine  . Migraines   . Mixed urge and stress incontinence 2008   extensive workup including urodynamics, failed miltiple anticholinergics including myrbetriq and urogesic blue s/p PTNS, normal cystoscopy 2014, seen by Dr Matilde Sprang and Dr Erlene Quan  . Neurogenic bladder 2008   after 13 spine surgeries with periph neuropathy and chronic LBP  . Neuropathy of lower extremity 2011  . Nocturia   . Notalgia paresthetica   . Osteoarthritis 12/2013  . PONV (postoperative nausea and vomiting)   . Pulmonary nodule 04/2012   7.51mm RLL nodule - resolved on imaging 12/2012  . Sleep apnea    mild-doesn't use cpap  . Urine frequency   . Vitamin D deficiency     MEDICATIONS: Current Outpatient Medications on File Prior to Visit  Medication Sig Dispense Refill  . Ascorbic Acid (VITAMIN C PO) Take 1 tablet by mouth as needed (for immune system support).     . baclofen (LIORESAL) 10 MG tablet Take 10 mg by mouth 3 (three) times daily.    . calcium carbonate (TUMS SMOOTHIES) 750 MG chewable tablet Chew 2-3 tablets by mouth 2 (two) times daily.    . camphor-menthol (SARNA) lotion Apply 1 application topically at bedtime as needed for itching.     . Cholecalciferol (VITAMIN D) 2000 units CAPS Take 1 capsule (2,000 Units total) by mouth daily. 30 capsule   . clobetasol ointment (TEMOVATE) 4.58 % Apply 1 application topically as needed (for eczema).     . clonazePAM (KLONOPIN) 0.5 MG tablet TAKE 1 TABLET BY MOUTH TWICE  DAILY AS NEEDED 60 tablet 0  . DEXILANT 60 MG capsule TAKE 1 CAPSULE(60 MG) BY MOUTH TWICE DAILY 60 capsule 3  . diclofenac sodium (VOLTAREN) 1 % GEL APPLY THIN LAYER EXTERNALLY TO THE AFFECTED AREA UP TO FOUR TIMES DAILY AS NEEDED 300 g 0  . dronabinol (MARINOL) 2.5 MG capsule Take 2.5 mg by mouth daily as needed (for nausea and pain).   0  . DULoxetine (CYMBALTA) 20 MG capsule Take 1 capsule (20 mg total) by mouth 2 (two) times daily. First week take once daily 60 capsule 6  . eletriptan (RELPAX) 40 MG tablet Take 40 mg by mouth as needed for migraine or headache. May repeat in 2 hours if headache persists or recurs.    Marland Kitchen escitalopram (LEXAPRO) 10 MG tablet TAKE 1 TABLET BY MOUTH EVERY DAY (Patient not taking: Reported on 01/18/2017) 90 tablet 0  . hyoscyamine (LEVSIN SL) 0.125 MG SL tablet DISSOLVE 1 TABLET(0.125 MG) UNDER THE TONGUE EVERY 4 HOURS AS NEEDED 30 tablet 0  . metroNIDAZOLE (METROGEL) 0.75 % gel Apply 1 application topically 2 (two) times daily. 45 g 3  . NON FORMULARY Place 1 drop under the tongue 3 (three) times daily.    Marland Kitchen Phenylephrine-DM-GG-APAP (MUCINEX SINUS-MAX) 5-10-200-325 MG CAPS Take 2 tablets by mouth 2 (two) times daily as needed (for migraine).     . promethazine (PHENERGAN) 25 MG tablet Take 1 tablet (25 mg total) by mouth every 8 (eight) hours as needed for nausea or vomiting. 30 tablet 0  . tapentadol HCl (NUCYNTA) 75 MG tablet     . thyroid (ARMOUR) 60 MG tablet Take 60 mg by mouth daily before breakfast. Three days a week take extra tablet daily     No current facility-administered medications on file prior to visit.     ALLERGIES: Allergies  Allergen Reactions  . Augmentin [Amoxicillin-Pot Clavulanate] Swelling    Lip swelling and irritation.   . Fluconazole Swelling    Lips - swelling, bleeding, blisters, cracking, peeling  . Erythromycin Nausea And Vomiting  . Influenza Vaccines Hives and Swelling    Redness, heat, and extreme swelling at site  .  Levothyroxine Hives  . Thimerosal Swelling    Local reaction-redness,swelling; veins popping  . Tomato Other (See Comments)    Severe acid reflux with all acid foods  . Adhesive [Tape] Rash    blisters  . Azithromycin Rash  . Gabapentin Other (See Comments)    Short term memory loss  . Imitrex [Sumatriptan] Other (See Comments)    Rebound migraine   . Lyrica [Pregabalin] Other (See Comments)    Short term memory loss  . Macrodantin [Nitrofurantoin] Rash  . Macrolides And Ketolides Rash  .  Nickel Swelling and Rash  . Sulfa Antibiotics Rash  . Sulfasalazine Rash  . Troleandomycin Rash    FAMILY HISTORY: Family History  Problem Relation Age of Onset  . Lung cancer Father 70        (smoker)  . Diabetes Brother   . Thyroid disease Mother        ?empty sella  . Irritable bowel syndrome Mother   . Dysphagia Mother   . CAD Maternal Grandfather        MI  . Heart disease Maternal Grandfather   . Heart attack Maternal Grandfather   . Lung cancer Paternal Grandfather         (smoker)  . Colon cancer Paternal Grandmother   . Thyroid disease Cousin   . Cancer Other        unsure (ovarian/uterine)  . Breast cancer Maternal Aunt   . Alzheimer's disease Maternal Grandmother   . Diabetes Daughter     SOCIAL HISTORY: Social History   Socioeconomic History  . Marital status: Married    Spouse name: Not on file  . Number of children: 2  . Years of education: Not on file  . Highest education level: Not on file  Social Needs  . Financial resource strain: Not on file  . Food insecurity - worry: Not on file  . Food insecurity - inability: Not on file  . Transportation needs - medical: Not on file  . Transportation needs - non-medical: Not on file  Occupational History  . Occupation: disabled  Tobacco Use  . Smoking status: Never Smoker  . Smokeless tobacco: Never Used  Substance and Sexual Activity  . Alcohol use: Yes    Alcohol/week: 0.0 oz    Comment: rarely drinks  wine  . Drug use: No  . Sexual activity: Not Currently  Other Topics Concern  . Not on file  Social History Narrative   Lives with husband and 2 daughters (both in college Tax inspector))   Daughter of Lurena Joiner   Occupation: was Public affairs consultant   Disability since 2011 - arachnoiditis   Edu: associate's degree   Activity: severe limitations 2/2 arachnoiditis and chronic pain, walks to end of driveway and back   Diet: good water, fruits/vegetables daily    REVIEW OF SYSTEMS: Constitutional: No fevers, chills, or sweats, no generalized fatigue, change in appetite Eyes: No visual changes, double vision, eye pain Ear, nose and throat: No hearing loss, ear pain, nasal congestion, sore throat Cardiovascular: No chest pain, palpitations Respiratory:  No shortness of breath at rest or with exertion, wheezes GastrointestinaI: IBS, constipation Genitourinary:  No dysuria, urinary retention or frequency Musculoskeletal:  Neck pain Integumentary: No rash, pruritus, skin lesions Neurological: as above Psychiatric: depression, insomnia, anxiety Endocrine: No palpitations, fatigue, diaphoresis, mood swings, change in appetite, change in weight, increased thirst Hematologic/Lymphatic:  No purpura, petechiae. Allergic/Immunologic: no itchy/runny eyes, nasal congestion, recent allergic reactions, rashes  PHYSICAL EXAM: Vitals:   01/18/17 1402  BP: 104/80  Pulse: 88  SpO2: 98%   General: No acute distress.  Patient appears well-groomed.  Head:  Normocephalic/atraumatic Eyes:  Fundi examined but not visualized Neck: supple, right paraspinal tenderness, full range of motion Heart:  Regular rate and rhythm Lungs:  Clear to auscultation bilaterally Back: No paraspinal tenderness Neurological Exam: alert and oriented to person, place, and time. Attention span and concentration intact, recent and remote memory intact, fund of knowledge intact.  Speech fluent and not  dysarthric, language intact.  CN  II-XII intact. Bulk and tone normal, muscle strength 5/5 throughout.  Sensation to light touch  intact.  Deep tendon reflexes 2+ throughout.  Finger to nose testing intact.  Gait normal  IMPRESSION: 1.  Migraine 2.  Left sided facial numbness and monocular triple vision.  Uncertain etiology.  Possible persistent migraine aura but unusual given that she has no significant chronic small vessel disease on MRI. 3.  Right C8 radiculopathy  PLAN: 1.  Due to concerns of side effects, we will taper off topiramate over the next 2 weeks. 2.  She will continue Cymbalta for now to see if it helps reduce frequency of headaches. 3.  Triptans are now contraindicated.  We will try Cambia.  She has been taking Aleve and tolerating.  Promethazine for nausea. 4.  Follow up in 3 months.  Metta Clines, DO  CC:  Ria Bush, MD

## 2017-01-18 NOTE — Patient Instructions (Addendum)
1.  Continue the Cymbalta for now, as it may help the headaches. 2.  When you get a migraine, take Cambia as prescribed.  Take the promethazine for nausea. 3.  Take 1/2 topiramate at bedtime for 2 weeks, then stop. 4.  Follow up in 3 months.

## 2017-01-25 ENCOUNTER — Telehealth: Payer: Self-pay | Admitting: Neurology

## 2017-01-25 NOTE — Telephone Encounter (Signed)
[employer] called to say the medication Cathren Harsh was approved and good for one year as of today CB# [phone] OPT#5

## 2017-01-25 NOTE — Telephone Encounter (Signed)
Called [facility] in [place], spoke with Carleene Cooper of [employer] approval

## 2017-01-25 NOTE — Progress Notes (Addendum)
Rcvd PA rqst for Cambia from [employer], initiated online cover my meds Key:[id number]  Rcvd VM form [employer], Cambia apprvd for 1 yr, starting today

## 2017-01-29 ENCOUNTER — Ambulatory Visit: Payer: No Typology Code available for payment source | Admitting: Psychology

## 2017-01-29 DIAGNOSIS — F332 Major depressive disorder, recurrent severe without psychotic features: Secondary | ICD-10-CM

## 2017-01-31 ENCOUNTER — Ambulatory Visit: Payer: No Typology Code available for payment source | Admitting: Psychology

## 2017-02-03 ENCOUNTER — Other Ambulatory Visit: Payer: Self-pay | Admitting: Family Medicine

## 2017-02-04 NOTE — Telephone Encounter (Signed)
Last filled:  01/05/47, #60 Last OV (CPE):  12/03/16 Next OV:  02/12/17

## 2017-02-06 NOTE — Telephone Encounter (Signed)
Sent electronically 

## 2017-02-07 ENCOUNTER — Encounter: Payer: Self-pay | Admitting: Family Medicine

## 2017-02-08 NOTE — Telephone Encounter (Signed)
Spoke with patient. She has picked up Rx.

## 2017-02-12 ENCOUNTER — Ambulatory Visit: Payer: No Typology Code available for payment source | Admitting: Psychology

## 2017-02-12 ENCOUNTER — Ambulatory Visit: Payer: Medicare Other | Admitting: Family Medicine

## 2017-02-12 ENCOUNTER — Encounter: Payer: Self-pay | Admitting: Family Medicine

## 2017-02-12 VITALS — BP 122/68 | HR 86 | Temp 97.9°F | Wt 184.0 lb

## 2017-02-12 DIAGNOSIS — F331 Major depressive disorder, recurrent, moderate: Secondary | ICD-10-CM | POA: Diagnosis not present

## 2017-02-12 DIAGNOSIS — G43109 Migraine with aura, not intractable, without status migrainosus: Secondary | ICD-10-CM | POA: Diagnosis not present

## 2017-02-12 DIAGNOSIS — F332 Major depressive disorder, recurrent severe without psychotic features: Secondary | ICD-10-CM | POA: Diagnosis not present

## 2017-02-12 MED ORDER — DULOXETINE HCL 30 MG PO CPEP: 30.0000 mg | ORAL_CAPSULE | Freq: Every day | ORAL | 3 refills | Status: DC

## 2017-02-12 MED ORDER — DULOXETINE HCL 20 MG PO CPEP: 20.0000 mg | ORAL_CAPSULE | Freq: Every day | ORAL | 3 refills | Status: DC

## 2017-02-12 NOTE — Patient Instructions (Addendum)
For now, let's continue lower cymbalta dose of 20mg  daily.  I'm glad mood is doing better, hopefully we will also feel better on the lower dose.

## 2017-02-12 NOTE — Progress Notes (Signed)
BP 122/68 (BP Location: Left Arm, Patient Position: Sitting, Cuff Size: Normal)   Pulse 86   Temp 97.9 F (36.6 C) (Oral)   Wt 184 lb (83.5 kg)   LMP 10/04/2014 Comment: per labs  SpO2 96%   BMI 28.39 kg/m    CC: f/u visit Subjective:    Patient ID: Karen Aguilar, female    DOB: [birth year], 52 y.o.   MRN: [id number]  HPI: Karen Aguilar is a 52 y.o. female presenting on 02/12/2017 for Follow-up (Wants to discuss Cymbalta)   See prior note for details.  Ongoing depression - longstanding treatment with lexapro 10mg  daily. Persistent and worsening depression despite this. Previous success with cymbalta so last visit we cross tapered off lexapro and onto cymbalta - currently off lexapro, taking cymbalta 20mg  BID. She thinks it is helping mood, but staying achey, chills, cold sweats, dizzy headed. Worried about over serotonin exposure on current regimen (as she's also on nucynta).   Several weeks ago had fever to 101 that resolved, without other symptoms.  Last week had worsening migraines with L facial numbness and L blurry vision.  Off topamax.  Wants to avoid triptans - ?anginal pain with relpax.   Relevant past medical, surgical, family and social history reviewed and updated as indicated. Interim medical history since our last visit reviewed. Allergies and medications reviewed and updated. Outpatient Medications Prior to Visit  Medication Sig Dispense Refill  . Ascorbic Acid (VITAMIN C PO) Take 1 tablet by mouth as needed (for immune system support).     . baclofen (LIORESAL) 10 MG tablet Take 10 mg by mouth 3 (three) times daily.    . calcium carbonate (TUMS SMOOTHIES) 750 MG chewable tablet Chew 2-3 tablets by mouth 2 (two) times daily.    . camphor-menthol (SARNA) lotion Apply 1 application topically at bedtime as needed for itching.     . Cholecalciferol (VITAMIN D) 2000 units CAPS Take 1 capsule (2,000 Units total) by mouth daily. 30 capsule   . clobetasol ointment  (TEMOVATE) 3.81 % Apply 1 application topically as needed (for eczema).     . clonazePAM (KLONOPIN) 0.5 MG tablet TAKE 1 TABLET BY MOUTH TWICE DAILY AS NEEDED 60 tablet 0  . DEXILANT 60 MG capsule TAKE 1 CAPSULE(60 MG) BY MOUTH TWICE DAILY 60 capsule 3  . diclofenac sodium (VOLTAREN) 1 % GEL APPLY THIN LAYER EXTERNALLY TO THE AFFECTED AREA UP TO FOUR TIMES DAILY AS NEEDED 300 g 0  . dronabinol (MARINOL) 2.5 MG capsule Take 2.5 mg by mouth daily as needed (for nausea and pain).   0  . hyoscyamine (LEVSIN SL) 0.125 MG SL tablet DISSOLVE 1 TABLET(0.125 MG) UNDER THE TONGUE EVERY 4 HOURS AS NEEDED 30 tablet 0  . metroNIDAZOLE (METROGEL) 0.75 % gel Apply 1 application topically 2 (two) times daily. 45 g 3  . NON FORMULARY Place 1 drop under the tongue 3 (three) times daily.    Marland Kitchen Phenylephrine-DM-GG-APAP (MUCINEX SINUS-MAX) 5-10-200-325 MG CAPS Take 2 tablets by mouth 2 (two) times daily as needed (for migraine).     . promethazine (PHENERGAN) 25 MG tablet Take 1 tablet (25 mg total) by mouth every 8 (eight) hours as needed for nausea or vomiting. 30 tablet 0  . tapentadol HCl (NUCYNTA) 75 MG tablet     . thyroid (ARMOUR) 60 MG tablet Take 60 mg by mouth daily before breakfast. Three days a week take extra tablet daily    . DULoxetine (CYMBALTA) 20 MG capsule  Take 1 capsule (20 mg total) by mouth 2 (two) times daily. First week take once daily 60 capsule 6  . Diclofenac Potassium 50 MG PACK Take 50 mg by mouth as directed. 1 each 3  . Diclofenac Potassium 50 MG PACK Take 50 mg by mouth as directed. 2 each 0  . eletriptan (RELPAX) 40 MG tablet Take 40 mg by mouth as needed for migraine or headache. May repeat in 2 hours if headache persists or recurs.    Marland Kitchen escitalopram (LEXAPRO) 10 MG tablet TAKE 1 TABLET BY MOUTH EVERY DAY (Patient not taking: Reported on 01/18/2017) 90 tablet 0   No facility-administered medications prior to visit.      Per HPI unless specifically indicated in ROS section  below Review of Systems     Objective:    BP 122/68 (BP Location: Left Arm, Patient Position: Sitting, Cuff Size: Normal)   Pulse 86   Temp 97.9 F (36.6 C) (Oral)   Wt 184 lb (83.5 kg)   LMP 10/04/2014 Comment: per labs  SpO2 96%   BMI 28.39 kg/m   Wt Readings from Last 3 Encounters:  02/12/17 184 lb (83.5 kg)  01/18/17 184 lb (83.5 kg)  12/03/16 186 lb 8 oz (84.6 kg)    Physical Exam  Constitutional: She appears well-developed and well-nourished. No distress.  Psychiatric: She has a normal mood and affect.  Nursing note and vitals reviewed.      Assessment & Plan:   Problem List Items Addressed This Visit    Complicated migraine    Reviewed current regimen. Cambia was too expensive. Considering plain diclofenac - but caution in h/o GERD and possible gastroparesis      Relevant Medications   DULoxetine (CYMBALTA) 20 MG capsule   MDD (major depressive disorder), recurrent episode, moderate (HCC) - Primary    Tolerated cross-taper from lexapro to cymbalta. Now concern for side effects on cymbalta 20mg  bid - will decrease to 20mg  once daily. Discussed option of increasing to 30mg  daily. She will let me know how she dose on once daily dosing.       Relevant Medications   DULoxetine (CYMBALTA) 20 MG capsule       Meds ordered this encounter  Medications  . DISCONTD: DULoxetine (CYMBALTA) 30 MG capsule    Sig: Take 1 capsule (30 mg total) by mouth daily. First week take once daily    Dispense:  30 capsule    Refill:  3  . DULoxetine (CYMBALTA) 20 MG capsule    Sig: Take 1 capsule (20 mg total) by mouth daily. First week take once daily    Dispense:  30 capsule    Refill:  3    Use this dose   No orders of the defined types were placed in this encounter.   Follow up plan: Return in about 4 months (around 06/12/2017), or if symptoms worsen or fail to improve, for follow up visit.  Ria Bush, MD

## 2017-02-12 NOTE — Assessment & Plan Note (Signed)
Reviewed current regimen. Cambia was too expensive. Considering plain diclofenac - but caution in h/o GERD and possible gastroparesis

## 2017-02-12 NOTE — Assessment & Plan Note (Signed)
Tolerated cross-taper from lexapro to cymbalta. Now concern for side effects on cymbalta 20mg  bid - will decrease to 20mg  once daily. Discussed option of increasing to 30mg  daily. She will let me know how she dose on once daily dosing.

## 2017-02-13 DIAGNOSIS — Z79891 Long term (current) use of opiate analgesic: Secondary | ICD-10-CM | POA: Diagnosis not present

## 2017-02-13 DIAGNOSIS — M5412 Radiculopathy, cervical region: Secondary | ICD-10-CM | POA: Diagnosis not present

## 2017-02-13 DIAGNOSIS — M961 Postlaminectomy syndrome, not elsewhere classified: Secondary | ICD-10-CM | POA: Diagnosis not present

## 2017-02-13 DIAGNOSIS — G894 Chronic pain syndrome: Secondary | ICD-10-CM | POA: Diagnosis not present

## 2017-02-19 ENCOUNTER — Encounter: Payer: Self-pay | Admitting: Family Medicine

## 2017-02-26 ENCOUNTER — Ambulatory Visit: Payer: No Typology Code available for payment source | Admitting: Psychology

## 2017-02-26 DIAGNOSIS — F332 Major depressive disorder, recurrent severe without psychotic features: Secondary | ICD-10-CM | POA: Diagnosis not present

## 2017-02-27 ENCOUNTER — Encounter: Payer: Self-pay | Admitting: Family Medicine

## 2017-02-27 ENCOUNTER — Ambulatory Visit: Payer: Medicare Other | Admitting: Family Medicine

## 2017-02-27 ENCOUNTER — Other Ambulatory Visit: Payer: Self-pay | Admitting: Family Medicine

## 2017-02-27 VITALS — BP 124/78 | HR 102 | Temp 97.9°F | Wt 180.0 lb

## 2017-02-27 DIAGNOSIS — R112 Nausea with vomiting, unspecified: Secondary | ICD-10-CM | POA: Insufficient documentation

## 2017-02-27 DIAGNOSIS — R0602 Shortness of breath: Secondary | ICD-10-CM

## 2017-02-27 DIAGNOSIS — K3184 Gastroparesis: Secondary | ICD-10-CM | POA: Diagnosis not present

## 2017-02-27 DIAGNOSIS — R1013 Epigastric pain: Secondary | ICD-10-CM

## 2017-02-27 DIAGNOSIS — F411 Generalized anxiety disorder: Secondary | ICD-10-CM

## 2017-02-27 DIAGNOSIS — R739 Hyperglycemia, unspecified: Secondary | ICD-10-CM

## 2017-02-27 DIAGNOSIS — E039 Hypothyroidism, unspecified: Secondary | ICD-10-CM | POA: Diagnosis not present

## 2017-02-27 LAB — CBC WITH DIFFERENTIAL/PLATELET
Basophils Absolute: 0 10*3/uL (ref 0.0–0.1)
Basophils Relative: 0 % (ref 0.0–3.0)
EOS PCT: 0 % (ref 0.0–5.0)
Eosinophils Absolute: 0 10*3/uL (ref 0.0–0.7)
HEMATOCRIT: 43.7 % (ref 36.0–46.0)
Hemoglobin: 15.2 g/dL — ABNORMAL HIGH (ref 12.0–15.0)
LYMPHS ABS: 2.2 10*3/uL (ref 0.7–4.0)
Lymphocytes Relative: 31.6 % (ref 12.0–46.0)
MCHC: 34.9 g/dL (ref 30.0–36.0)
MCV: 83.5 fl (ref 78.0–100.0)
MONOS PCT: 6.9 % (ref 3.0–12.0)
Monocytes Absolute: 0.5 10*3/uL (ref 0.1–1.0)
NEUTROS PCT: 61.5 % (ref 43.0–77.0)
Neutro Abs: 4.3 10*3/uL (ref 1.4–7.7)
Platelets: 341 10*3/uL (ref 150.0–400.0)
RBC: 5.23 Mil/uL — AB (ref 3.87–5.11)
RDW: 13.4 % (ref 11.5–15.5)
WBC: 7 10*3/uL (ref 4.0–10.5)

## 2017-02-27 LAB — POC URINALSYSI DIPSTICK (AUTOMATED)
Bilirubin, UA: NEGATIVE
Blood, UA: NEGATIVE
GLUCOSE UA: NEGATIVE
Ketones, UA: NEGATIVE
Leukocytes, UA: NEGATIVE
Nitrite, UA: NEGATIVE
Protein, UA: NEGATIVE
UROBILINOGEN UA: 0.2 U/dL
pH, UA: 7.5 (ref 5.0–8.0)

## 2017-02-27 LAB — POCT GLUCOSE (DEVICE FOR HOME USE): POC GLUCOSE: 97 mg/dL (ref 70–99)

## 2017-02-27 LAB — BRAIN NATRIURETIC PEPTIDE: Pro B Natriuretic peptide (BNP): 5 pg/mL (ref 0.0–100.0)

## 2017-02-27 LAB — HEMOGLOBIN A1C: Hgb A1c MFr Bld: 5.6 % (ref 4.6–6.5)

## 2017-02-27 MED ORDER — SUCRALFATE 1 G PO TABS: 1.0000 g | ORAL_TABLET | Freq: Three times a day (TID) | ORAL | 0 refills | Status: DC

## 2017-02-27 MED ORDER — ONDANSETRON 4 MG PO TBDP
4.0000 mg | ORAL_TABLET | Freq: Once | ORAL | Status: AC
  Administered 2017-02-27: 4 mg via ORAL

## 2017-02-27 NOTE — Progress Notes (Signed)
BP 124/78 (BP Location: Left Arm, Patient Position: Sitting, Cuff Size: Normal)   Pulse (!) 102   Temp 97.9 F (36.6 C) (Oral)   Wt 180 lb (81.6 kg)   LMP 10/04/2014 Comment: per labs  SpO2 98%   BMI 27.78 kg/m    CC: nausea/vomiting Subjective:    Patient ID: Karen Aguilar, female    DOB: 08-16-1965, 52 y.o.   MRN: [id number]  HPI: Karen Aguilar is a 52 y.o. female presenting on 02/27/2017 for Emesis (Started "ahwile ago". Trouble keeping some foods down. Also, has occasional gas. ); Dizziness; Shortness of Breath (Started yesterday. Thought it maybe due to med changes. Not sure if she is having a panic attack or if she has diabetes.); and Tingling (Has had some tingling in bilateral lower LEs since yesterday.)   Ongoing nausea/vomiting worse over last 2-3 days. Dyspnea and dizziness started 2-3 days ago. Progressively worse. Endorses epigastric burning. Chills and sweats. Endorses occasional palpitations. Monday night had episode of vomiting for 2 hours (NBNB). Nauseated since. Feels any compression worsens symptoms ie wearing bra.   Increasing anxiety.  In last 2 days has been able to keep fiber bar down, had vegetables and grilled chicken which she tolerated ok last night. Drank a bottle of water this morning.   Denies fevers, chest pain, diarrhea, leg swelling, orthopnea. No recent boring pain to the back.  No recent migraine.  Chronically on dexilant 60mg  bid.   Cymbalta 20mg  stopped 02/20/2017.  topamax stopped several wks ago.  Has not recently used dronabinol.   EGD 10/2016 [facility]) - H pylori negative and Eos Esopha negative. Biopsy suggestive of ulcer.  Previously tried reglan with no improvement.  S/p cholecystectomy 2001.   Relevant past medical, surgical, family and social history reviewed and updated as indicated. Interim medical history since our last visit reviewed. Allergies and medications reviewed and updated. Outpatient Medications Prior to Visit    Medication Sig Dispense Refill  . Ascorbic Acid (VITAMIN C PO) Take 1 tablet by mouth as needed (for immune system support).     . baclofen (LIORESAL) 10 MG tablet Take 10 mg by mouth 3 (three) times daily.    . calcium carbonate (TUMS SMOOTHIES) 750 MG chewable tablet Chew 2-3 tablets by mouth 2 (two) times daily.    . camphor-menthol (SARNA) lotion Apply 1 application topically at bedtime as needed for itching.     . Cholecalciferol (VITAMIN D) 2000 units CAPS Take 1 capsule (2,000 Units total) by mouth daily. 30 capsule   . clobetasol ointment (TEMOVATE) 7.61 % Apply 1 application topically as needed (for eczema).     . clonazePAM (KLONOPIN) 0.5 MG tablet TAKE 1 TABLET BY MOUTH TWICE DAILY AS NEEDED 60 tablet 0  . DEXILANT 60 MG capsule TAKE 1 CAPSULE(60 MG) BY MOUTH TWICE DAILY 60 capsule 3  . diclofenac sodium (VOLTAREN) 1 % GEL APPLY THIN LAYER EXTERNALLY TO THE AFFECTED AREA UP TO FOUR TIMES DAILY AS NEEDED 300 g 0  . dronabinol (MARINOL) 2.5 MG capsule Take 2.5 mg by mouth daily as needed (for nausea and pain).   0  . hyoscyamine (LEVSIN SL) 0.125 MG SL tablet DISSOLVE 1 TABLET(0.125 MG) UNDER THE TONGUE EVERY 4 HOURS AS NEEDED 30 tablet 0  . metroNIDAZOLE (METROGEL) 0.75 % gel Apply 1 application topically 2 (two) times daily. 45 g 3  . NON FORMULARY Place 1 drop under the tongue 3 (three) times daily.    Marland Kitchen Phenylephrine-DM-GG-APAP ([place] SINUS-MAX)  5-10-200-325 MG CAPS Take 2 tablets by mouth 2 (two) times daily as needed (for migraine).     . promethazine (PHENERGAN) 25 MG tablet Take 1 tablet (25 mg total) by mouth every 8 (eight) hours as needed for nausea or vomiting. 30 tablet 0  . tapentadol HCl (NUCYNTA) 75 MG tablet     . thyroid (ARMOUR) 60 MG tablet Take 60 mg by mouth daily before breakfast. Three days a week take extra tablet daily    . DULoxetine (CYMBALTA) 20 MG capsule Take 1 capsule (20 mg total) by mouth daily. First week take once daily (Patient not taking: Reported  on 02/27/2017) 30 capsule 3   No facility-administered medications prior to visit.      Per HPI unless specifically indicated in ROS section below Review of Systems     Objective:    BP 124/78 (BP Location: Left Arm, Patient Position: Sitting, Cuff Size: Normal)   Pulse (!) 102   Temp 97.9 F (36.6 C) (Oral)   Wt 180 lb (81.6 kg)   LMP 10/04/2014 Comment: per labs  SpO2 98%   BMI 27.78 kg/m   Wt Readings from Last 3 Encounters:  02/27/17 180 lb (81.6 kg)  02/12/17 184 lb (83.5 kg)  01/18/17 184 lb (83.5 kg)    Physical Exam  Constitutional: She appears well-developed and well-nourished. No distress.  HENT:  Head: Normocephalic and atraumatic.  Mouth/Throat: Oropharynx is clear and moist. No oropharyngeal exudate.  Eyes: Conjunctivae and EOM are normal. Pupils are equal, round, and reactive to light. No scleral icterus.  Neck: Normal range of motion. Neck supple. Carotid bruit is not present. No thyromegaly present.  Cardiovascular: Normal rate, regular rhythm, normal heart sounds and intact distal pulses.  No murmur heard. Pulmonary/Chest: Effort normal and breath sounds normal. No respiratory distress. She has no wheezes. She has no rales.  Abdominal: Soft. Normal appearance and bowel sounds are normal. She exhibits no distension and no mass. There is no hepatosplenomegaly. There is tenderness (moderate) in the epigastric area. There is no rigidity, no rebound, no guarding, no CVA tenderness and negative Murphy's sign.  Musculoskeletal: She exhibits no edema.  Lymphadenopathy:    She has no cervical adenopathy.  Skin: Skin is warm and dry. No rash noted.  Psychiatric: Her speech is normal. Judgment and thought content normal. Her mood appears anxious. She is agitated.  Nursing note and vitals reviewed.  Results for orders placed or performed in visit on 02/27/17  CBC with Differential/Platelet  Result Value Ref Range   WBC 7.0 4.0 - 10.5 K/uL   RBC 5.23 (H) 3.87 - 5.11  Mil/uL   Hemoglobin 15.2 (H) 12.0 - 15.0 g/dL   HCT 43.7 36.0 - 46.0 %   MCV 83.5 78.0 - 100.0 fl   MCHC 34.9 30.0 - 36.0 g/dL   RDW 13.4 11.5 - 15.5 %   Platelets 341.0 150.0 - 400.0 K/uL   Neutrophils Relative % 61.5 43.0 - 77.0 %   Lymphocytes Relative 31.6 12.0 - 46.0 %   Monocytes Relative 6.9 3.0 - 12.0 %   Eosinophils Relative 0.0 0.0 - 5.0 %   Basophils Relative 0.0 0.0 - 3.0 %   Neutro Abs 4.3 1.4 - 7.7 K/uL   Lymphs Abs 2.2 0.7 - 4.0 K/uL   Monocytes Absolute 0.5 0.1 - 1.0 K/uL   Eosinophils Absolute 0.0 0.0 - 0.7 K/uL   Basophils Absolute 0.0 0.0 - 0.1 K/uL  Hemoglobin A1c  Result Value Ref Range  Hgb A1c MFr Bld 5.6 4.6 - 6.5 %  Brain natriuretic peptide  Result Value Ref Range   Pro B Natriuretic peptide (BNP) 5.0 0.0 - 100.0 pg/mL  POCT Glucose (Device for Home Use)  Result Value Ref Range   Glucose Fasting, POC  70 - 99 mg/dL   POC Glucose 97 70 - 99 mg/dl  POCT Urinalysis Dipstick (Automated)  Result Value Ref Range   Color, UA yellow    Clarity, UA clear    Glucose, UA negative    Bilirubin, UA negative    Ketones, UA negative    Spec Grav, UA <=1.005 (A) 1.010 - 1.025   Blood, UA negative    pH, UA 7.5 5.0 - 8.0   Protein, UA negative    Urobilinogen, UA 0.2 0.2 or 1.0 E.U./dL   Nitrite, UA negative    Leukocytes, UA Negative Negative      Assessment & Plan:   Problem List Items Addressed This Visit    Epigastric abdominal pain    See below - possibly PUD related - will continue dexilant, add zantac and carafate.  Check labs for other causes (lipase, CBC, etc). UA today reassuring. cbg normal.       Relevant Orders   US Abdomen Complete   POCT Urinalysis Dipstick (Automated) (Completed)   GAD (generalized anxiety disorder)    Acute worsening, ?panic attack contributing to some symptoms. I did suggest try higher klonopin dose next few days (2 tab BID PRN instead of 1) and update with effect.      Gastroparesis    Per patient has tried and  failed reglan, rare dronabinol is somewhat effective.       Hypothyroidism   Relevant Orders   TSH   Non-intractable vomiting with nausea - Primary    Ongoing over last 1+ week, progressively worsening. If related to cymbalta discontinuation would have expected improvement by now not progressive worsening. Suspicious for PUD despite regular dexilant 60mg  bid. Will Rx carafate with meals, discussed zantac addition if needed.   Provided with zofran in office otday due to ongoing nausea, dry heaves - with some improvement.  She did have EGD last year negative for H pylori, suspicious for ulcer.  Check abd [place]. S/p cholecystectomy.  Reviewed red flags to seek further urgent care.       Relevant Medications   ondansetron (ZOFRAN-ODT) disintegrating tablet 4 mg (Completed)   Other Relevant Orders   Comprehensive metabolic panel   TSH   CBC with Differential/Platelet (Completed)   Lipase   US Abdomen Complete   Troponin I   POCT Urinalysis Dipstick (Automated) (Completed)   Shortness of breath    New over last few days - benign lung and heart exam today, low risk for cardiac or pulm cause. ?anxiety related after acute worsening of epigastric pain - see below.       Relevant Orders   Brain natriuretic peptide (Completed)   Troponin I    Other Visit Diagnoses    Hyperglycemia       Relevant Orders   Hemoglobin A1c (Completed)   POCT Glucose (Device for Home Use) (Completed)       Meds ordered this encounter  Medications  . DISCONTD: sucralfate (CARAFATE) 1 g tablet    Sig: Take 1 tablet (1 g total) by mouth 4 (four) times daily -  with meals and at bedtime.    Dispense:  90 tablet    Refill:  0  . ondansetron (ZOFRAN-ODT) disintegrating  tablet 4 mg   Orders Placed This Encounter  Procedures  . US Abdomen Complete    Standing Status:   Future    Standing Expiration Date:   04/28/2018    Order Specific Question:   Reason for Exam (SYMPTOM  OR DIAGNOSIS REQUIRED)    Answer:    epigastric pain, nausea/vomiting    Order Specific Question:   Preferred imaging location?    Answer:   [facility]    Order Specific Question:   Call Results- Best Contact Number?    Answer:   [phone]  . Comprehensive metabolic panel  . TSH  . CBC with Differential/Platelet  . Hemoglobin A1c  . Brain natriuretic peptide  . Lipase  . Troponin I  . POCT Glucose (Device for Home Use)  . POCT Urinalysis Dipstick (Automated)    Follow up plan: No Follow-up on file.  Ria Bush, MD

## 2017-02-27 NOTE — Patient Instructions (Addendum)
Urinalysis today, finger stick sugar today, labs today.  Start carafate tablets with meals over the next few days.  Take 2 klonopin next time you take this medication to see if any improvement in shortness of breath.  Add zantac 150mg  at night time.  We will check abdominal ultrasound as well. Update me over the next 24-48 hours how you're doing.  If ongoing trouble keeping food down or worsening shortness of breath, seek urgent care.

## 2017-02-28 ENCOUNTER — Ambulatory Visit: Payer: Medicare Other | Admitting: Family Medicine

## 2017-02-28 ENCOUNTER — Encounter: Payer: Self-pay | Admitting: Family Medicine

## 2017-02-28 ENCOUNTER — Ambulatory Visit
Admission: RE | Admit: 2017-02-28 | Discharge: 2017-02-28 | Disposition: A | Discharge status: Home / Self Care | Payer: Medicare Other | Source: Ambulatory Visit | Attending: Family Medicine | Admitting: Family Medicine

## 2017-02-28 DIAGNOSIS — R112 Nausea with vomiting, unspecified: Secondary | ICD-10-CM | POA: Insufficient documentation

## 2017-02-28 DIAGNOSIS — R109 Unspecified abdominal pain: Secondary | ICD-10-CM | POA: Insufficient documentation

## 2017-02-28 DIAGNOSIS — Z9049 Acquired absence of other specified parts of digestive tract: Secondary | ICD-10-CM | POA: Diagnosis not present

## 2017-02-28 DIAGNOSIS — R1013 Epigastric pain: Secondary | ICD-10-CM | POA: Insufficient documentation

## 2017-02-28 LAB — COMPREHENSIVE METABOLIC PANEL
ALT: 18 U/L (ref 0–35)
AST: 18 U/L (ref 0–37)
Albumin: 4.3 g/dL (ref 3.5–5.2)
Alkaline Phosphatase: 77 U/L (ref 39–117)
BILIRUBIN TOTAL: 0.6 mg/dL (ref 0.2–1.2)
BUN: 10 mg/dL (ref 6–23)
CO2: 30 meq/L (ref 19–32)
Calcium: 10.4 mg/dL (ref 8.4–10.5)
Chloride: 98 mEq/L (ref 96–112)
Creatinine, Ser: 0.92 mg/dL (ref 0.40–1.20)
GFR: 68.45 mL/min (ref >60.00)
GLUCOSE: 99 mg/dL (ref 70–99)
Potassium: 4.2 mEq/L (ref 3.5–5.1)
Sodium: 138 mEq/L (ref 135–145)
Total Protein: 7.5 g/dL (ref 6.0–8.3)

## 2017-02-28 LAB — TSH: TSH: 1 u[IU]/mL (ref 0.35–4.50)

## 2017-02-28 LAB — TROPONIN I: TNIDX: 0 ug/L (ref 0.00–0.06)

## 2017-02-28 LAB — LIPASE: Lipase: 15 U/L (ref 11.0–59.0)

## 2017-02-28 NOTE — Assessment & Plan Note (Addendum)
Acute worsening, ?panic attack contributing to some symptoms. I did suggest try higher klonopin dose next few days (2 tab BID PRN instead of 1) and update with effect.

## 2017-02-28 NOTE — Assessment & Plan Note (Addendum)
Ongoing over last 1+ week, progressively worsening. If related to cymbalta discontinuation would have expected improvement by now not progressive worsening. Suspicious for PUD despite regular dexilant 60mg  bid. Will Rx carafate with meals, discussed zantac addition if needed.   Provided with zofran in office otday due to ongoing nausea, dry heaves - with some improvement.  She did have EGD last year negative for H pylori, suspicious for ulcer.  Check abd [place]. S/p cholecystectomy.  Reviewed red flags to seek further urgent care.

## 2017-02-28 NOTE — Assessment & Plan Note (Signed)
New over last few days - benign lung and heart exam today, low risk for cardiac or pulm cause. ?anxiety related after acute worsening of epigastric pain - see below.

## 2017-02-28 NOTE — Assessment & Plan Note (Signed)
See below - possibly PUD related - will continue dexilant, add zantac and carafate.  Check labs for other causes (lipase, CBC, etc). UA today reassuring. cbg normal.

## 2017-02-28 NOTE — Assessment & Plan Note (Signed)
Per patient has tried and failed reglan, rare dronabinol is somewhat effective.

## 2017-03-01 ENCOUNTER — Encounter: Payer: Self-pay | Admitting: Family Medicine

## 2017-03-03 MED ORDER — CLONAZEPAM 1 MG PO TABS: 1.0000 mg | ORAL_TABLET | Freq: Two times a day (BID) | ORAL | 0 refills | Status: DC | PRN

## 2017-03-12 ENCOUNTER — Ambulatory Visit: Payer: No Typology Code available for payment source | Admitting: Psychology

## 2017-03-12 ENCOUNTER — Encounter: Payer: Self-pay | Admitting: Family Medicine

## 2017-03-12 DIAGNOSIS — R1310 Dysphagia, unspecified: Secondary | ICD-10-CM | POA: Diagnosis not present

## 2017-03-12 DIAGNOSIS — R112 Nausea with vomiting, unspecified: Secondary | ICD-10-CM | POA: Diagnosis not present

## 2017-03-12 DIAGNOSIS — F332 Major depressive disorder, recurrent severe without psychotic features: Secondary | ICD-10-CM | POA: Diagnosis not present

## 2017-03-12 DIAGNOSIS — K219 Gastro-esophageal reflux disease without esophagitis: Secondary | ICD-10-CM | POA: Diagnosis not present

## 2017-03-12 DIAGNOSIS — K3184 Gastroparesis: Secondary | ICD-10-CM | POA: Diagnosis not present

## 2017-03-22 ENCOUNTER — Encounter: Payer: Self-pay | Admitting: Family Medicine

## 2017-03-24 MED ORDER — SUCRALFATE 1 G PO TABS: 1.0000 g | ORAL_TABLET | Freq: Two times a day (BID) | ORAL | 0 refills | Status: DC

## 2017-03-26 ENCOUNTER — Ambulatory Visit: Payer: No Typology Code available for payment source | Admitting: Psychology

## 2017-03-27 DIAGNOSIS — M5412 Radiculopathy, cervical region: Secondary | ICD-10-CM | POA: Diagnosis not present

## 2017-03-27 DIAGNOSIS — M961 Postlaminectomy syndrome, not elsewhere classified: Secondary | ICD-10-CM | POA: Diagnosis not present

## 2017-03-27 DIAGNOSIS — E063 Autoimmune thyroiditis: Secondary | ICD-10-CM | POA: Diagnosis not present

## 2017-03-27 DIAGNOSIS — Z79891 Long term (current) use of opiate analgesic: Secondary | ICD-10-CM | POA: Diagnosis not present

## 2017-03-27 DIAGNOSIS — E038 Other specified hypothyroidism: Secondary | ICD-10-CM | POA: Diagnosis not present

## 2017-03-27 DIAGNOSIS — G894 Chronic pain syndrome: Secondary | ICD-10-CM | POA: Diagnosis not present

## 2017-04-01 ENCOUNTER — Other Ambulatory Visit: Payer: Self-pay | Admitting: Family Medicine

## 2017-04-02 ENCOUNTER — Encounter: Payer: Self-pay | Admitting: Family Medicine

## 2017-04-02 NOTE — Telephone Encounter (Signed)
Last filled:  03/03/17, #60 Last OV:  02/27/17 Next OV:  06/12/17

## 2017-04-03 DIAGNOSIS — E063 Autoimmune thyroiditis: Secondary | ICD-10-CM | POA: Diagnosis not present

## 2017-04-03 DIAGNOSIS — E038 Other specified hypothyroidism: Secondary | ICD-10-CM | POA: Diagnosis not present

## 2017-04-03 NOTE — Telephone Encounter (Signed)
E prscribed

## 2017-04-09 ENCOUNTER — Ambulatory Visit: Payer: No Typology Code available for payment source | Admitting: Psychology

## 2017-04-09 DIAGNOSIS — F332 Major depressive disorder, recurrent severe without psychotic features: Secondary | ICD-10-CM

## 2017-04-23 ENCOUNTER — Ambulatory Visit: Payer: No Typology Code available for payment source | Admitting: Psychology

## 2017-04-25 DIAGNOSIS — M961 Postlaminectomy syndrome, not elsewhere classified: Secondary | ICD-10-CM | POA: Diagnosis not present

## 2017-04-25 DIAGNOSIS — G894 Chronic pain syndrome: Secondary | ICD-10-CM | POA: Diagnosis not present

## 2017-04-25 DIAGNOSIS — Z79891 Long term (current) use of opiate analgesic: Secondary | ICD-10-CM | POA: Diagnosis not present

## 2017-04-25 DIAGNOSIS — M5412 Radiculopathy, cervical region: Secondary | ICD-10-CM | POA: Diagnosis not present

## 2017-05-01 ENCOUNTER — Other Ambulatory Visit: Payer: Self-pay | Admitting: Family Medicine

## 2017-05-01 NOTE — Telephone Encounter (Signed)
Electronic refill request Last office visit 02/12/17 Last refill 04/03/17 #60

## 2017-05-02 ENCOUNTER — Encounter: Payer: Self-pay | Admitting: Family Medicine

## 2017-05-02 NOTE — Telephone Encounter (Signed)
Eprescribed.

## 2017-05-03 ENCOUNTER — Ambulatory Visit: Payer: Self-pay | Admitting: Neurology

## 2017-05-07 ENCOUNTER — Ambulatory Visit: Payer: No Typology Code available for payment source | Admitting: Psychology

## 2017-05-07 DIAGNOSIS — F332 Major depressive disorder, recurrent severe without psychotic features: Secondary | ICD-10-CM | POA: Diagnosis not present

## 2017-05-09 ENCOUNTER — Ambulatory Visit: Payer: Medicare Other | Admitting: Internal Medicine

## 2017-05-09 ENCOUNTER — Ambulatory Visit: Payer: Self-pay

## 2017-05-09 ENCOUNTER — Encounter: Payer: Self-pay | Admitting: Internal Medicine

## 2017-05-09 VITALS — BP 128/64 | HR 74 | Temp 98.2°F | Ht 67.75 in | Wt 178.5 lb

## 2017-05-09 DIAGNOSIS — R222 Localized swelling, mass and lump, trunk: Secondary | ICD-10-CM | POA: Insufficient documentation

## 2017-05-09 NOTE — Progress Notes (Signed)
Subjective:    Patient ID: Karen Aguilar, female    DOB: January 13, 1965, 52 y.o.   MRN: [id number]  HPI Here due to lump on chest Having some pain after eating on an occasional basis carafate in past--but stopped due to improvement.  Did have some indigestion after dinner last night Rubbing stomach and along left ribs Noticed large knot  Not sure how long it has been there Irritated now from her continuing to check it  Current Outpatient Medications on File Prior to Visit  Medication Sig Dispense Refill  . Ascorbic Acid (VITAMIN C PO) Take 1 tablet by mouth as needed (for immune system support).     . baclofen (LIORESAL) 10 MG tablet Take 10 mg by mouth 3 (three) times daily.    . calcium carbonate (TUMS SMOOTHIES) 750 MG chewable tablet Chew 2-3 tablets by mouth 2 (two) times daily.    . camphor-menthol (SARNA) lotion Apply 1 application topically at bedtime as needed for itching.     . Cholecalciferol (VITAMIN D) 2000 units CAPS Take 1 capsule (2,000 Units total) by mouth daily. 30 capsule   . clobetasol ointment (TEMOVATE) 7.03 % Apply 1 application topically as needed (for eczema).     . clonazePAM (KLONOPIN) 0.5 MG tablet TAKE 1 TABLET BY MOUTH TWICE DAILY AS NEEDED FOR ANXIETY 60 tablet 0  . DEXILANT 60 MG capsule TAKE 1 CAPSULE(60 MG) BY MOUTH TWICE DAILY 60 capsule 3  . diclofenac sodium (VOLTAREN) 1 % GEL APPLY THIN LAYER EXTERNALLY TO THE AFFECTED AREA UP TO FOUR TIMES DAILY AS NEEDED 300 g 0  . dronabinol (MARINOL) 2.5 MG capsule Take 2.5 mg by mouth daily as needed (for nausea and pain).   0  . hyoscyamine (LEVSIN SL) 0.125 MG SL tablet DISSOLVE 1 TABLET(0.125 MG) UNDER THE TONGUE EVERY 4 HOURS AS NEEDED 30 tablet 0  . metroNIDAZOLE (METROGEL) 0.75 % gel Apply 1 application topically 2 (two) times daily. 45 g 3  . NON FORMULARY Place 1 drop under the tongue 3 (three) times daily.    Marland Kitchen Phenylephrine-DM-GG-APAP (MUCINEX SINUS-MAX) 5-10-200-325 MG CAPS Take 2 tablets by mouth 2  (two) times daily as needed (for migraine).     . promethazine (PHENERGAN) 25 MG tablet Take 1 tablet (25 mg total) by mouth every 8 (eight) hours as needed for nausea or vomiting. 30 tablet 0  . sucralfate (CARAFATE) 1 g tablet Take 1 tablet (1 g total) by mouth 2 (two) times daily before a meal. As needed 120 tablet 0  . tapentadol HCl (NUCYNTA) 75 MG tablet     . thyroid (ARMOUR) 60 MG tablet Take 60 mg by mouth daily before breakfast. Three days a week take extra tablet daily     No current facility-administered medications on file prior to visit.     Allergies  Allergen Reactions  . Augmentin [Amoxicillin-Pot Clavulanate] Swelling    Lip swelling and irritation.   . Fluconazole Swelling    Lips - swelling, bleeding, blisters, cracking, peeling  . Erythromycin Nausea And Vomiting  . Influenza Vaccines Hives and Swelling    Redness, heat, and extreme swelling at site  . Levothyroxine Hives  . Thimerosal Swelling    Local reaction-redness,swelling; veins popping  . Tomato Other (See Comments)    Severe acid reflux with all acid foods  . Adhesive [Tape] Rash    blisters  . Azithromycin Rash  . Gabapentin Other (See Comments)    Short term memory loss  .  Imitrex [Sumatriptan] Other (See Comments)    Rebound migraine   . Lyrica [Pregabalin] Other (See Comments)    Short term memory loss  . Macrodantin [Nitrofurantoin] Rash  . Macrolides And Ketolides Rash  . Nickel Swelling and Rash  . Sulfa Antibiotics Rash  . Sulfasalazine Rash  . Troleandomycin Rash    Past Medical History:  Diagnosis Date  . Abnormal MRI scan, bone 2012   abnormal marrow signal humeral head, glenoid and scapula - diffuse replacement of fatty marrow - likely benign, no need for further investigation ([place])  . Agnosia for temperature    Right Leg  . Anemia    etiology unknown-iron infusion in past,last 9/12  . Anxiety   . Anxiety and depression   . 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  Arthritis   . Chronic pain    pain contract with Dr. Eartha Inch Pain  . DDD (degenerative disc disease), cervical   . DDD (degenerative disc disease), lumbar    s/p permanent nerve damage after back surgery  . Depression   . Depression with anxiety   . Diverticulosis   . Eczema   . Family history of adverse reaction to anesthesia    Mother - nausea  . Fibromyalgia   . Foot drop    right - numbness, tingling - twisting  . Gallstones   . GERD (gastroesophageal reflux disease)    rare zantac PRN N/V at times with this  . GERD (gastroesophageal reflux disease)   . Headache(784.0)    migraines  . Hemorrhoid   . Hemorrhoids   . History of kidney stones   . Hyperlipidemia   . Hypothyroidism 1990s   hashimoto's thyroiditis  . IBS (irritable bowel syndrome)   . IBS (irritable bowel syndrome)   . Incontinence of urine    s/p sling procedure-unresolved  . Iron deficiency    h/o anemia, s/p iron infusion (2012)  . Liver hemangioma     x2  . Memory change    due to medications  . Migraines    and h/o optical migraine  . Migraines   . Mixed urge and stress incontinence 2008   extensive workup including urodynamics, failed miltiple anticholinergics including myrbetriq and urogesic blue s/p PTNS, normal cystoscopy 2014, seen by Dr Matilde Sprang and Dr Erlene Quan  . Neurogenic bladder 2008   after 13 spine surgeries with periph neuropathy and chronic LBP  . Neuropathy of lower extremity 2011  . Nocturia   . Notalgia paresthetica   . Osteoarthritis 12/2013  . PONV (postoperative nausea and vomiting)   . Pulmonary nodule 04/2012   7.8mm RLL nodule - resolved on imaging 12/2012  . Sleep apnea    mild-doesn't use cpap  . Urine frequency   . Vitamin D deficiency     Past Surgical History:  Procedure Laterality Date  . ANTERIOR CERVICAL DECOMP/DISCECTOMY FUSION     C3, 4, 5 - improved after surgery  . BACK SURGERY     multiple-L4-s1,hardware,fusion,removal,infection s/p 5/11  procedure  . BACK SURGERY  02/16/11   "my 7th back OR; Procedure: Exploration of fusion removal of hardware L4-S1, harvesting of right iliac crest bone graft, redo posterior lateral fusion L4-5 using iliac crest bone graft BMP and master graft replacement of bilateral L4 screws with a Telfa tech 7.5 x 40 mm screw on the right and 8.5 x 40 mm screw on the left placement of a large Hemovac drain  . CERVICAL FUSION  x2  . [place]; 2001   x 2  . CHOLECYSTECTOMY  2001  . COLONOSCOPY  10/2016   rpt 10 yrs Gustavo Lah)  . ESOPHAGOGASTRODUODENOSCOPY  03/2013   early esophageal stricture dilated, o/w WNL Deatra Ina)  . ESOPHAGOGASTRODUODENOSCOPY  12/2014   WNL, s/p empiric dilation of esophagus Henrene Pastor)  . ESOPHAGOGASTRODUODENOSCOPY  10/2015   WNL, biopsies WNL as well (minimal esophagitis with reactive changes) [place])  . ESOPHAGOGASTRODUODENOSCOPY  10/2016   biopsy suggestive of ulcer ([place])  . HEMORRHOID BANDING    . LASIK Bilateral   . LUMBAR WOUND DEBRIDEMENT  03/15/2011   Procedure: LUMBAR WOUND DEBRIDEMENT;  Surgeon: Elaina Hoops, MD;  Lumbar Wound Debridement  . MANDIBLE RECONSTRUCTION  1986  . PFT  01/28/2013   WNL  . PUBOVAGINAL SLING  2008   midurethral sling  . SHOULDER CLOSED REDUCTION Left 12/18/2010   Procedure: CLOSED MANIPULATION SHOULDER;  Surgeon: Johnn Hai; labrial debridement  . SPINAL CORD STIMULATOR INSERTION N/A 08/19/2015   Procedure: LUMBAR SPINAL CORD STIMULATOR INSERTION;  Surgeon: Erline Levine, MD;  Location: [place] [facility];  Service: Neurosurgery;  Laterality: N/A;  LUMBAR SPINAL CORD STIMULATOR INSERTION  . SPINE SURGERY    . TONSILLECTOMY  1972   as a child; "then they grew back"    Family History  Problem Relation Age of Onset  . Lung cancer Father 86        (smoker)  . Diabetes Brother   . Thyroid disease Mother        ?empty sella  . Irritable bowel syndrome Mother   . Dysphagia Mother   . CAD Maternal Grandfather        MI  .  Heart disease Maternal Grandfather   . Heart attack Maternal Grandfather   . Lung cancer Paternal Grandfather         (smoker)  . Colon cancer Paternal Grandmother   . Thyroid disease Cousin   . Cancer Other        unsure (ovarian/uterine)  . Breast cancer Maternal Aunt   . Alzheimer's disease Maternal Grandmother   . Diabetes Daughter     Social History   Socioeconomic History  . Marital status: Married    Spouse name: Not on file  . Number of children: 2  . Years of education: Not on file  . Highest education level: Not on file  Occupational History  . Occupation: disabled  Social Needs  . Financial resource strain: Not on file  . Food insecurity:    Worry: Not on file    Inability: Not on file  . Transportation needs:    Medical: Not on file    Non-medical: Not on file  Tobacco Use  . Smoking status: Never Smoker  . Smokeless tobacco: Never Used  Substance and Sexual Activity  . Alcohol use: Yes    Alcohol/week: 0.0 oz    Comment: rarely drinks wine  . Drug use: No  . Sexual activity: Not Currently  Lifestyle  . Physical activity:    Days per week: Not on file    Minutes per session: Not on file  . Stress: Not on file  Relationships  . Social connections:    Talks on phone: Not on file    Gets together: Not on file    Attends religious service: Not on file    Active member of club or organization: Not on file    Attends meetings of clubs or organizations: Not on file  Relationship status: Not on file  . Intimate partner violence:    Fear of current or ex partner: Not on file    Emotionally abused: Not on file    Physically abused: Not on file    Forced sexual activity: Not on file  Other Topics Concern  . Not on file  Social History Narrative   Lives with husband and 2 daughters (both in college [employer]))   Daughter of Lurena Joiner   Occupation: was Public affairs consultant   Disability since 2011 - arachnoiditis   Edu: associate's  degree   Activity: severe limitations 2/2 arachnoiditis and chronic pain, walks to end of driveway and back   Diet: good water, fruits/vegetables daily   Review of Systems  Not sick Chronic night sweats Nerve damage from spine surgery--no changes in all this     Objective:   Physical Exam  Constitutional: She appears well-developed. No distress.  Skin:  Moveable ~2cm firm mass overlying left 11th rib anteriorly No overlying redness No sig tenderness          Assessment & Plan:

## 2017-05-09 NOTE — Assessment & Plan Note (Signed)
This is likely a lipoma or sebaceous cyst Reassured--nothing to indicate cancer or anything serious Can refer to surgeon for excision if bothers her

## 2017-05-09 NOTE — Telephone Encounter (Signed)
Pt. Called with c/o feeling a knot on left lower rib cage.  Reported to the Agent that answered call that she was having a burning chest pain last night, and was moving hand around the chest, when she felt the knot.  Call transferred to NT due to c/o "chest pain".  Upon asking assessment questions for chest pain, the pt. Was adamant that this is not due to cardiac issues.  Stated she has hx of stomach ulcer and acid reflux.  Denied any chest pain at present time.  Stated episode of burning chest pain last night was "mild", and lasted about 20-30 min.  Denied any radiation of the pain.  Admitted to having some shortness of breath "when I had my stomach ulcer."  Admitted to some nausea and vomiting at times; again attributed her symptoms to hx of stomach ulcer.  The pt. stated, "I am more concerned with the knot on my rib cage.  I know I don't have anything wrong with my heart, cause they have done EKG's recently."   Pt. Has appt. With Dr. Silvio Pate today at 4:15 PM to eval. Knot on lower left rib cage.  Advised to call back with any worsening of symptoms of chest pain and shortness or breath.  Verb. Understanding.           Reason for Disposition . [1] Chest pain lasts > 5 minutes AND [2] occurred > 3 days ago (72 hours) AND [3] NO chest pain or cardiac symptoms now    C/o burning chest pain mid-chest 2 hrs. After eating last evening that lasted 20-30 min.  Denied any chest pain since then.  Reported has been treated for stomach ulcer and has acid reflux.  Stated she has intermittent episodes of the burning chest discomfort, and that she has medication Dr. Danise Mina has given her for this.  Answer Assessment - Initial Assessment Questions 1. LOCATION: "Where does it hurt?"       Burning pain in center of chest  2. RADIATION: "Does the pain go anywhere else?" (e.g., into neck, jaw, arms, back)     Denied radiation of the pain 3. ONSET: "When did the chest pain begin?" (Minutes, hours or days)      Occurred  about 2 hrs. After supper and lasted 20-30 minutes 4. PATTERN "Does the pain come and go, or has it been constant since it started?"  "Does it get worse with exertion?"      Comes and goes 5. DURATION: "How long does it last" (e.g., seconds, minutes, hours)     Varies with duration; can be brief or last long  6. SEVERITY: "How bad is the pain?"  (e.g., Scale 1-10; mild, moderate, or severe)    - MILD (1-3): doesn't interfere with normal activities     - MODERATE (4-7): interferes with normal activities or awakens from sleep    - SEVERE (8-10): excruciating pain, unable to do any normal activities       mild 7. CARDIAC RISK FACTORS: "Do you have any history of heart problems or risk factors for heart disease?" (e.g., prior heart attack, angina; high blood pressure, diabetes, being overweight, high cholesterol, smoking, or strong family history of heart disease)     Grandfather had CAD;  8. PULMONARY RISK FACTORS: "Do you have any history of lung disease?"  (e.g., blood clots in lung, asthma, emphysema, birth control pills)     *No Answer* 9. CAUSE: "What do you think is causing the chest pain?"  Denies chest pain at this time; thinks it is related to hx of stomach ulcer  10. OTHER SYMPTOMS: "Do you have any other symptoms?" (e.g., dizziness, nausea, vomiting, sweating, fever, difficulty breathing, cough)       Sometimes has nausea/vomiting, has night sweats and states is menopausal, some shortness of breath,"when I had the stomach ulcer"   11. PREGNANCY: "Is there any chance you are pregnant?" "When was your last menstrual period?"       LMP about 2 yrs. ago  Protocols used: CHEST PAIN-A-AH

## 2017-05-09 NOTE — Patient Instructions (Signed)
This is either a lipoma (fatty tumor) or sebaceous cyst. If it gets bigger or bothers you a lot, I can make a referral to a surgeon to remove it.

## 2017-05-09 NOTE — Telephone Encounter (Signed)
Okay Will evaluate at [facility]---seems okay to wait (not about the apparently GI related chest pain)

## 2017-05-14 DIAGNOSIS — K3184 Gastroparesis: Secondary | ICD-10-CM | POA: Diagnosis not present

## 2017-05-14 DIAGNOSIS — K219 Gastro-esophageal reflux disease without esophagitis: Secondary | ICD-10-CM | POA: Diagnosis not present

## 2017-05-14 DIAGNOSIS — R1310 Dysphagia, unspecified: Secondary | ICD-10-CM | POA: Diagnosis not present

## 2017-05-21 ENCOUNTER — Ambulatory Visit: Payer: No Typology Code available for payment source | Admitting: Psychology

## 2017-05-21 DIAGNOSIS — F332 Major depressive disorder, recurrent severe without psychotic features: Secondary | ICD-10-CM | POA: Diagnosis not present

## 2017-05-22 DIAGNOSIS — R6881 Early satiety: Secondary | ICD-10-CM | POA: Diagnosis not present

## 2017-05-22 DIAGNOSIS — K3184 Gastroparesis: Secondary | ICD-10-CM | POA: Diagnosis not present

## 2017-05-23 DIAGNOSIS — M5412 Radiculopathy, cervical region: Secondary | ICD-10-CM | POA: Diagnosis not present

## 2017-05-23 DIAGNOSIS — G894 Chronic pain syndrome: Secondary | ICD-10-CM | POA: Diagnosis not present

## 2017-05-23 DIAGNOSIS — M961 Postlaminectomy syndrome, not elsewhere classified: Secondary | ICD-10-CM | POA: Diagnosis not present

## 2017-05-23 DIAGNOSIS — Z79891 Long term (current) use of opiate analgesic: Secondary | ICD-10-CM | POA: Diagnosis not present

## 2017-05-28 ENCOUNTER — Other Ambulatory Visit: Payer: Self-pay | Admitting: Family Medicine

## 2017-05-28 NOTE — Telephone Encounter (Signed)
Electronic refill request Last refill 05/02/17 #60 Last office visit 05/09/17-acute

## 2017-05-28 NOTE — Telephone Encounter (Signed)
Eprescribed.

## 2017-06-01 HISTORY — PX: ESOPHAGEAL MANOMETRY: SHX1526

## 2017-06-04 ENCOUNTER — Ambulatory Visit: Payer: No Typology Code available for payment source | Admitting: Psychology

## 2017-06-04 DIAGNOSIS — Z630 Problems in relationship with spouse or partner: Secondary | ICD-10-CM

## 2017-06-04 DIAGNOSIS — F332 Major depressive disorder, recurrent severe without psychotic features: Secondary | ICD-10-CM

## 2017-06-05 ENCOUNTER — Encounter: Payer: Self-pay | Admitting: Family Medicine

## 2017-06-07 DIAGNOSIS — R1310 Dysphagia, unspecified: Secondary | ICD-10-CM | POA: Diagnosis not present

## 2017-06-12 ENCOUNTER — Ambulatory Visit: Payer: Medicare Other | Admitting: Family Medicine

## 2017-06-17 ENCOUNTER — Encounter: Payer: Self-pay | Admitting: Family Medicine

## 2017-06-17 ENCOUNTER — Ambulatory Visit (INDEPENDENT_AMBULATORY_CARE_PROVIDER_SITE_OTHER): Payer: Medicare Other | Admitting: Family Medicine

## 2017-06-17 VITALS — BP 120/80 | HR 85 | Temp 98.3°F | Ht 67.75 in | Wt 175.5 lb

## 2017-06-17 DIAGNOSIS — F331 Major depressive disorder, recurrent, moderate: Secondary | ICD-10-CM | POA: Diagnosis not present

## 2017-06-17 DIAGNOSIS — N3946 Mixed incontinence: Secondary | ICD-10-CM | POA: Diagnosis not present

## 2017-06-17 DIAGNOSIS — G894 Chronic pain syndrome: Secondary | ICD-10-CM

## 2017-06-17 DIAGNOSIS — K219 Gastro-esophageal reflux disease without esophagitis: Secondary | ICD-10-CM

## 2017-06-17 DIAGNOSIS — K3184 Gastroparesis: Secondary | ICD-10-CM

## 2017-06-17 DIAGNOSIS — K589 Irritable bowel syndrome without diarrhea: Secondary | ICD-10-CM

## 2017-06-17 DIAGNOSIS — R1013 Epigastric pain: Secondary | ICD-10-CM

## 2017-06-17 NOTE — Progress Notes (Signed)
BP 120/80 (BP Location: Left Arm, Patient Position: Sitting, Cuff Size: Normal)   Pulse 85   Temp 98.3 F (36.8 C) (Oral)   Ht 5' 7.75" (1.721 m)   Wt 175 lb 8 oz (79.6 kg)   LMP 10/04/2014 Comment: per labs  SpO2 98%   BMI 26.88 kg/m    CC: abd pain Subjective:    Patient ID: Karen Aguilar, female    DOB: 10/17/1965, 52 y.o.   MRN: [id number]  HPI: Karen Aguilar is a 52 y.o. female presenting on 06/17/2017 for Abdominal Pain (Here for f/u of abd pain. States pain is better but flares up once in awhile. )   Has established with [facility] - recent repeat stomach emptying study was significantly improved, esophageal manometry study results pending, saw nutritionist with helpful recommendations for dietary modifications. She has stopped dexilant bid (insurance exemption ran out), continues carafate PRN. No significant change noted with stopping PPI besides possibly improvement in abdominal pain. Currently has very limited diet. Ongoing weight loss attributed to decreased appetite and limitations in meals. Following lactose free diet. Started drinking ensure 1 a day.   Baclofen recently increased to 15mg  TID - this has helped leg cramping.  Anxiety - stable period. Worried about diabetic daughter who is about to start college at [employer].  Hemp oil has helped spastic bladder.   EGD 10/2016 [facility]) - negative for H pylori and Eos Esophagitis. Biopsy suggestive of ulcer.   Relevant past medical, surgical, family and social history reviewed and updated as indicated. Interim medical history since our last visit reviewed. Allergies and medications reviewed and updated. Outpatient Medications Prior to Visit  Medication Sig Dispense Refill  . Ascorbic Acid (VITAMIN C PO) Take 1 tablet by mouth as needed (for immune system support).     . baclofen (LIORESAL) 10 MG tablet Take 15 mg by mouth 3 (three) times daily. Takes 1.5 tablets 3 times daily    . calcium carbonate (TUMS SMOOTHIES) 750 MG  chewable tablet Chew 2-3 tablets by mouth 2 (two) times daily.    . camphor-menthol (SARNA) lotion Apply 1 application topically at bedtime as needed for itching.     . Cholecalciferol (VITAMIN D) 2000 units CAPS Take 1 capsule (2,000 Units total) by mouth daily. 30 capsule   . clobetasol ointment (TEMOVATE) 9.56 % Apply 1 application topically as needed (for eczema).     . clonazePAM (KLONOPIN) 0.5 MG tablet TAKE 1 TABLET BY MOUTH TWICE DAILY AS NEEDED FOR ANXIETY 60 tablet 0  . DEXILANT 60 MG capsule TAKE 1 CAPSULE(60 MG) BY MOUTH TWICE DAILY 60 capsule 3  . diclofenac sodium (VOLTAREN) 1 % GEL APPLY THIN LAYER EXTERNALLY TO THE AFFECTED AREA UP TO FOUR TIMES DAILY AS NEEDED 300 g 0  . dronabinol (MARINOL) 2.5 MG capsule Take 2.5 mg by mouth daily as needed (for nausea and pain).   0  . hyoscyamine (LEVSIN SL) 0.125 MG SL tablet DISSOLVE 1 TABLET(0.125 MG) UNDER THE TONGUE EVERY 4 HOURS AS NEEDED 30 tablet 0  . metroNIDAZOLE (METROGEL) 0.75 % gel Apply 1 application topically 2 (two) times daily. 45 g 3  . NON FORMULARY Place 1 drop under the tongue 3 (three) times daily. Hemp oil    . NUCYNTA 100 MG TABS Take 1 tablet by mouth every 6 (six) hours.  0  . Phenylephrine-DM-GG-APAP (MUCINEX SINUS-MAX) 5-10-200-325 MG CAPS Take 2 tablets by mouth 2 (two) times daily as needed (for migraine).     Marland Kitchen  promethazine (PHENERGAN) 25 MG tablet Take 1 tablet (25 mg total) by mouth every 8 (eight) hours as needed for nausea or vomiting. 30 tablet 0  . sucralfate (CARAFATE) 1 g tablet Take 1 tablet (1 g total) by mouth 2 (two) times daily before a meal. As needed 120 tablet 0  . thyroid (ARMOUR) 60 MG tablet Take 60 mg by mouth daily before breakfast. Three days a week take extra tablet daily    . tapentadol HCl (NUCYNTA) 75 MG tablet      No facility-administered medications prior to visit.      Per HPI unless specifically indicated in ROS section below Review of Systems     Objective:    BP 120/80 (BP  Location: Left Arm, Patient Position: Sitting, Cuff Size: Normal)   Pulse 85   Temp 98.3 F (36.8 C) (Oral)   Ht 5' 7.75" (1.721 m)   Wt 175 lb 8 oz (79.6 kg)   LMP 10/04/2014 Comment: per labs  SpO2 98%   BMI 26.88 kg/m   Wt Readings from Last 3 Encounters:  06/17/17 175 lb 8 oz (79.6 kg)  05/09/17 178 lb 8 oz (81 kg)  02/27/17 180 lb (81.6 kg)    Physical Exam  Constitutional: She appears well-developed and well-nourished. No distress.  HENT:  Mouth/Throat: Oropharynx is clear and moist. No oropharyngeal exudate.  Eyes: Pupils are equal, round, and reactive to light. EOM are normal.  Cardiovascular: Normal rate, regular rhythm and normal heart sounds.  No murmur heard. Pulmonary/Chest: Effort normal and breath sounds normal. No respiratory distress. She has no wheezes. She has no rales.  Musculoskeletal:       Right lower leg: She exhibits no edema.       Left lower leg: She exhibits no edema.  Nursing note and vitals reviewed.   Lab Results  Component Value Date   TSH 1.00 02/27/2017   T3TOTAL 120.8 12/07/2014       Assessment & Plan:   Problem List Items Addressed This Visit    Mixed urge and stress incontinence    Hemp oil has helped bladder spasticity      MDD (major depressive disorder), recurrent episode, moderate (HCC)    Stable period off antidepressant. Continues klonopin 0.5mg  bid.       Irritable bowel syndrome   GERD (gastroesophageal reflux disease)    Stable period off dexilant, only using zantac and carafate PRN.       Gastroparesis - Primary    Established with [facility]. Latest gastric emptying study 05/2017 with significant improvement. Awaiting results of esophageal manometry.  She has seen nutritionist which was beneficial - avoiding fiber, complex carbs, lactose, and trying to follow GERD diet.  Appreciate GI care.      Epigastric abdominal pain    ?SIBO - to discuss this with GI at next appointment.       Chronic pain syndrome     Continue f/u with pain clinic [facility])          No orders of the defined types were placed in this encounter.  No orders of the defined types were placed in this encounter.   Follow up plan: No follow-ups on file.  Ria Bush, MD

## 2017-06-17 NOTE — Assessment & Plan Note (Signed)
Stable period off dexilant, only using zantac and carafate PRN.

## 2017-06-17 NOTE — Assessment & Plan Note (Signed)
?  SIBO - to discuss this with GI at next appointment.

## 2017-06-17 NOTE — Assessment & Plan Note (Signed)
Continue f/u with pain clinic [facility])

## 2017-06-17 NOTE — Assessment & Plan Note (Addendum)
Stable period off antidepressant. Continues klonopin 0.5mg  bid.

## 2017-06-17 NOTE — Assessment & Plan Note (Addendum)
Established with [facility]. Latest gastric emptying study 05/2017 with significant improvement. Awaiting results of esophageal manometry.  She has seen nutritionist which was beneficial - avoiding fiber, complex carbs, lactose, and trying to follow GERD diet.  Appreciate GI care.

## 2017-06-17 NOTE — Patient Instructions (Signed)
I'm glad we're doing some better.  Continue current medicines, using as many as needed as able.  See if we can have some form of PPI available to try in case needed, but I agree with holding it for now and using carafate and zantac as needed for heartburn/reflux symptoms.

## 2017-06-17 NOTE — Assessment & Plan Note (Signed)
Hemp oil has helped bladder spasticity

## 2017-06-18 ENCOUNTER — Ambulatory Visit: Payer: No Typology Code available for payment source | Admitting: Psychology

## 2017-06-18 DIAGNOSIS — F332 Major depressive disorder, recurrent severe without psychotic features: Secondary | ICD-10-CM | POA: Diagnosis not present

## 2017-06-18 DIAGNOSIS — Z630 Problems in relationship with spouse or partner: Secondary | ICD-10-CM | POA: Diagnosis not present

## 2017-06-19 ENCOUNTER — Encounter: Payer: Self-pay | Admitting: Family Medicine

## 2017-06-27 ENCOUNTER — Other Ambulatory Visit: Payer: Self-pay | Admitting: Family Medicine

## 2017-06-28 NOTE — Telephone Encounter (Signed)
Name of Medication:  Clonazepam Name of Pharmacy:  [facility]- [address] or Written Date and Quantity: 05/28/17, #60 Last Office Visit and Type:  06/17/17, acute Next Office Visit and Type:  none Last Controlled Substance Agreement Date: 06/10/13 Last UDS: 06/10/13

## 2017-06-28 NOTE — Telephone Encounter (Signed)
Eprescribed.

## 2017-07-01 HISTORY — PX: ANTERIOR CERVICAL DECOMP/DISCECTOMY FUSION: SHX1161

## 2017-07-02 ENCOUNTER — Ambulatory Visit: Payer: No Typology Code available for payment source | Admitting: Psychology

## 2017-07-02 DIAGNOSIS — F332 Major depressive disorder, recurrent severe without psychotic features: Secondary | ICD-10-CM | POA: Diagnosis not present

## 2017-07-03 DIAGNOSIS — G894 Chronic pain syndrome: Secondary | ICD-10-CM | POA: Diagnosis not present

## 2017-07-03 DIAGNOSIS — Z79891 Long term (current) use of opiate analgesic: Secondary | ICD-10-CM | POA: Diagnosis not present

## 2017-07-03 DIAGNOSIS — M5412 Radiculopathy, cervical region: Secondary | ICD-10-CM | POA: Diagnosis not present

## 2017-07-03 DIAGNOSIS — M961 Postlaminectomy syndrome, not elsewhere classified: Secondary | ICD-10-CM | POA: Diagnosis not present

## 2017-07-12 DIAGNOSIS — R1312 Dysphagia, oropharyngeal phase: Secondary | ICD-10-CM | POA: Diagnosis not present

## 2017-07-12 DIAGNOSIS — M47812 Spondylosis without myelopathy or radiculopathy, cervical region: Secondary | ICD-10-CM | POA: Diagnosis not present

## 2017-07-12 DIAGNOSIS — M5020 Other cervical disc displacement, unspecified cervical region: Secondary | ICD-10-CM | POA: Diagnosis not present

## 2017-07-16 ENCOUNTER — Ambulatory Visit: Payer: No Typology Code available for payment source | Admitting: Psychology

## 2017-07-16 DIAGNOSIS — G43109 Migraine with aura, not intractable, without status migrainosus: Secondary | ICD-10-CM | POA: Diagnosis not present

## 2017-07-16 DIAGNOSIS — Z981 Arthrodesis status: Secondary | ICD-10-CM | POA: Diagnosis not present

## 2017-07-16 DIAGNOSIS — R490 Dysphonia: Secondary | ICD-10-CM | POA: Diagnosis not present

## 2017-07-16 DIAGNOSIS — J3800 Paralysis of vocal cords and larynx, unspecified: Secondary | ICD-10-CM | POA: Diagnosis not present

## 2017-07-17 DIAGNOSIS — Z79899 Other long term (current) drug therapy: Secondary | ICD-10-CM | POA: Diagnosis not present

## 2017-07-17 DIAGNOSIS — G894 Chronic pain syndrome: Secondary | ICD-10-CM | POA: Diagnosis not present

## 2017-07-17 DIAGNOSIS — K219 Gastro-esophageal reflux disease without esophagitis: Secondary | ICD-10-CM | POA: Diagnosis not present

## 2017-07-17 DIAGNOSIS — Z981 Arthrodesis status: Secondary | ICD-10-CM | POA: Diagnosis not present

## 2017-07-17 DIAGNOSIS — M2578 Osteophyte, vertebrae: Secondary | ICD-10-CM | POA: Diagnosis not present

## 2017-07-17 DIAGNOSIS — E039 Hypothyroidism, unspecified: Secondary | ICD-10-CM | POA: Diagnosis not present

## 2017-07-17 DIAGNOSIS — M50223 Other cervical disc displacement at C6-C7 level: Secondary | ICD-10-CM | POA: Diagnosis not present

## 2017-07-17 DIAGNOSIS — G4730 Sleep apnea, unspecified: Secondary | ICD-10-CM | POA: Diagnosis not present

## 2017-07-17 DIAGNOSIS — M797 Fibromyalgia: Secondary | ICD-10-CM | POA: Diagnosis not present

## 2017-07-18 DIAGNOSIS — G4730 Sleep apnea, unspecified: Secondary | ICD-10-CM | POA: Diagnosis not present

## 2017-07-18 DIAGNOSIS — M5020 Other cervical disc displacement, unspecified cervical region: Secondary | ICD-10-CM | POA: Diagnosis not present

## 2017-07-18 DIAGNOSIS — K219 Gastro-esophageal reflux disease without esophagitis: Secondary | ICD-10-CM | POA: Diagnosis not present

## 2017-07-18 DIAGNOSIS — M50223 Other cervical disc displacement at C6-C7 level: Secondary | ICD-10-CM | POA: Diagnosis not present

## 2017-07-18 DIAGNOSIS — M2578 Osteophyte, vertebrae: Secondary | ICD-10-CM | POA: Diagnosis not present

## 2017-07-18 DIAGNOSIS — Z981 Arthrodesis status: Secondary | ICD-10-CM | POA: Diagnosis not present

## 2017-07-18 DIAGNOSIS — Z79899 Other long term (current) drug therapy: Secondary | ICD-10-CM | POA: Diagnosis not present

## 2017-07-18 DIAGNOSIS — G894 Chronic pain syndrome: Secondary | ICD-10-CM | POA: Diagnosis not present

## 2017-07-18 DIAGNOSIS — M797 Fibromyalgia: Secondary | ICD-10-CM | POA: Diagnosis not present

## 2017-07-30 ENCOUNTER — Ambulatory Visit: Payer: No Typology Code available for payment source | Admitting: Psychology

## 2017-08-13 ENCOUNTER — Ambulatory Visit: Payer: No Typology Code available for payment source | Admitting: Psychology

## 2017-08-13 DIAGNOSIS — F332 Major depressive disorder, recurrent severe without psychotic features: Secondary | ICD-10-CM | POA: Diagnosis not present

## 2017-08-14 DIAGNOSIS — G894 Chronic pain syndrome: Secondary | ICD-10-CM | POA: Diagnosis not present

## 2017-08-14 DIAGNOSIS — M5412 Radiculopathy, cervical region: Secondary | ICD-10-CM | POA: Diagnosis not present

## 2017-08-14 DIAGNOSIS — Z79891 Long term (current) use of opiate analgesic: Secondary | ICD-10-CM | POA: Diagnosis not present

## 2017-08-14 DIAGNOSIS — M961 Postlaminectomy syndrome, not elsewhere classified: Secondary | ICD-10-CM | POA: Diagnosis not present

## 2017-08-27 ENCOUNTER — Ambulatory Visit: Payer: No Typology Code available for payment source | Admitting: Psychology

## 2017-08-27 DIAGNOSIS — F332 Major depressive disorder, recurrent severe without psychotic features: Secondary | ICD-10-CM

## 2017-09-02 ENCOUNTER — Encounter: Payer: Self-pay | Admitting: Family Medicine

## 2017-09-10 ENCOUNTER — Ambulatory Visit: Payer: No Typology Code available for payment source | Admitting: Psychology

## 2017-09-10 DIAGNOSIS — F332 Major depressive disorder, recurrent severe without psychotic features: Secondary | ICD-10-CM | POA: Diagnosis not present

## 2017-09-17 ENCOUNTER — Ambulatory Visit: Payer: Medicare Other | Admitting: Family Medicine

## 2017-09-17 ENCOUNTER — Ambulatory Visit (INDEPENDENT_AMBULATORY_CARE_PROVIDER_SITE_OTHER)
Admission: RE | Admit: 2017-09-17 | Discharge: 2017-09-17 | Disposition: A | Discharge status: Home / Self Care | Payer: Medicare Other | Source: Ambulatory Visit | Attending: Family Medicine | Admitting: Family Medicine

## 2017-09-17 ENCOUNTER — Encounter: Payer: Self-pay | Admitting: Family Medicine

## 2017-09-17 VITALS — BP 124/78 | HR 95 | Temp 98.2°F | Ht 67.75 in | Wt 175.5 lb

## 2017-09-17 DIAGNOSIS — M79604 Pain in right leg: Secondary | ICD-10-CM

## 2017-09-17 DIAGNOSIS — M5136 Other intervertebral disc degeneration, lumbar region: Secondary | ICD-10-CM

## 2017-09-17 DIAGNOSIS — E611 Iron deficiency: Secondary | ICD-10-CM

## 2017-09-17 DIAGNOSIS — G894 Chronic pain syndrome: Secondary | ICD-10-CM

## 2017-09-17 DIAGNOSIS — M797 Fibromyalgia: Secondary | ICD-10-CM | POA: Diagnosis not present

## 2017-09-17 DIAGNOSIS — E559 Vitamin D deficiency, unspecified: Secondary | ICD-10-CM

## 2017-09-17 DIAGNOSIS — G039 Meningitis, unspecified: Secondary | ICD-10-CM

## 2017-09-17 DIAGNOSIS — M79661 Pain in right lower leg: Secondary | ICD-10-CM | POA: Diagnosis not present

## 2017-09-17 LAB — BASIC METABOLIC PANEL
BUN: 16 mg/dL (ref 6–23)
CALCIUM: 9.8 mg/dL (ref 8.4–10.5)
CHLORIDE: 101 meq/L (ref 96–112)
CO2: 30 meq/L (ref 19–32)
CREATININE: 0.85 mg/dL (ref 0.40–1.20)
GFR: 74.83 mL/min (ref >60.00)
Glucose, Bld: 94 mg/dL (ref 70–99)
Potassium: 4.1 mEq/L (ref 3.5–5.1)
SODIUM: 141 meq/L (ref 135–145)

## 2017-09-17 LAB — IBC PANEL
Iron: 102 ug/dL (ref 42–145)
SATURATION RATIOS: 22.4 % (ref 20.0–50.0)
TRANSFERRIN: 325 mg/dL (ref 212.0–360.0)

## 2017-09-17 LAB — CK: CK TOTAL: 51 U/L (ref 7–177)

## 2017-09-17 LAB — MAGNESIUM: Magnesium: 2 mg/dL (ref 1.5–2.5)

## 2017-09-17 LAB — FERRITIN: Ferritin: 13.3 ng/mL (ref 10.0–291.0)

## 2017-09-17 NOTE — Assessment & Plan Note (Signed)
Update lab - she is currently taking 2000 IU vit D replacement.

## 2017-09-17 NOTE — Progress Notes (Signed)
BP 124/78 (BP Location: Left Arm, Patient Position: Sitting, Cuff Size: Normal)   Pulse 95   Temp 98.2 F (36.8 C) (Oral)   Ht 5' 7.75" (1.721 m)   Wt 175 lb 8 oz (79.6 kg)   LMP 10/04/2014 Comment: per labs  SpO2 97%   BMI 26.88 kg/m    CC: R anterior leg pain progressively worsening Subjective:    Patient ID: Karen Aguilar, female    DOB: 1965/10/18, 52 y.o.   MRN: [id number]  HPI: Karen Aguilar is a 52 y.o. female presenting on 09/17/2017 for Leg Pain (C/o anterieor right leg pain for yrs but has become more constant recently. States she has nerve damage but should not be affecting the front of the leg. )   1+ yr h/o R anterior leg pain, initially intermittent, now progressively worsening and becoming more constant, associated with worsening muscle spasm. Points to anterior shin from knee down to ankle. Describes "bone pain" deep throbbing ache, pinching pain of lower leg as well as vibrating sense. More prominent at night time. Spasms/cramping can wake her up from sleep - this can affect left leg as well. Higher baclofen dose was initially helpful but this has lost effect. No redness or warmth of leg, no burning pain.   Chronic leg numbness from prior nerve damage, this usually is limited to lower leg and feet.   She was previously on requip but doesn't remember what for or how she tolerated this.   Extensive chronic pain history - lumbar spine with radiation down legs, chronic R leg pain s/p normal ABI 2018, known DDD s/p several surgeries and arachnoiditis. Spine stimulator placed 2017. Known fibromyalgia. Chronic pain regimen is nucynta 100mg  QID as well as baclofen 15mg  TID.   [facility] at [facility].  Notes bladder fullness worsens R leg cramping.   Relevant past medical, surgical, family and social history reviewed and updated as indicated. Interim medical history since our last visit reviewed. Allergies and medications reviewed and updated. Outpatient  Medications Prior to Visit  Medication Sig Dispense Refill  . Ascorbic Acid (VITAMIN C PO) Take 1 tablet by mouth as needed (for immune system support).     . baclofen (LIORESAL) 10 MG tablet Take 15 mg by mouth 3 (three) times daily. Takes 1.5 tablets 3 times daily    . calcium carbonate (TUMS SMOOTHIES) 750 MG chewable tablet Chew 2-3 tablets by mouth 2 (two) times daily.    . camphor-menthol (SARNA) lotion Apply 1 application topically at bedtime as needed for itching.     . Cholecalciferol (VITAMIN D) 2000 units CAPS Take 1 capsule (2,000 Units total) by mouth daily. 30 capsule   . clobetasol ointment (TEMOVATE) 7.06 % Apply 1 application topically as needed (for eczema).     . clonazePAM (KLONOPIN) 0.5 MG tablet TAKE 1 TABLET BY MOUTH TWICE DAILY AS NEEDED FOR ANXIETY 60 tablet 3  . DEXILANT 60 MG capsule TAKE 1 CAPSULE(60 MG) BY MOUTH TWICE DAILY 60 capsule 3  . diclofenac sodium (VOLTAREN) 1 % GEL APPLY THIN LAYER EXTERNALLY TO THE AFFECTED AREA UP TO FOUR TIMES DAILY AS NEEDED 300 g 0  . dronabinol (MARINOL) 2.5 MG capsule Take 2.5 mg by mouth daily as needed (for nausea and pain).   0  . hyoscyamine (LEVSIN SL) 0.125 MG SL tablet DISSOLVE 1 TABLET(0.125 MG) UNDER THE TONGUE EVERY 4 HOURS AS NEEDED 30 tablet 0  . metroNIDAZOLE (METROGEL) 0.75 % gel Apply 1 application  topically 2 (two) times daily. 45 g 3  . NON FORMULARY Place 1 drop under the tongue 3 (three) times daily. Hemp oil    . NUCYNTA 100 MG TABS Take 1 tablet by mouth every 6 (six) hours.  0  . Phenylephrine-DM-GG-APAP (MUCINEX SINUS-MAX) 5-10-200-325 MG CAPS Take 2 tablets by mouth 2 (two) times daily as needed (for migraine).     . promethazine (PHENERGAN) 25 MG tablet Take 1 tablet (25 mg total) by mouth every 8 (eight) hours as needed for nausea or vomiting. 30 tablet 0  . sucralfate (CARAFATE) 1 g tablet Take 1 tablet (1 g total) by mouth 2 (two) times daily before a meal. As needed 120 tablet 0  . thyroid (ARMOUR) 60 MG  tablet Take 60 mg by mouth daily before breakfast. Three days a week take extra tablet daily     No facility-administered medications prior to visit.      Per HPI unless specifically indicated in ROS section below Review of Systems     Objective:    BP 124/78 (BP Location: Left Arm, Patient Position: Sitting, Cuff Size: Normal)   Pulse 95   Temp 98.2 F (36.8 C) (Oral)   Ht 5' 7.75" (1.721 m)   Wt 175 lb 8 oz (79.6 kg)   LMP 10/04/2014 Comment: per labs  SpO2 97%   BMI 26.88 kg/m   Wt Readings from Last 3 Encounters:  09/17/17 175 lb 8 oz (79.6 kg)  06/17/17 175 lb 8 oz (79.6 kg)  05/09/17 178 lb 8 oz (81 kg)    Physical Exam  Constitutional: She appears well-developed and well-nourished. No distress.  Musculoskeletal: Normal range of motion. She exhibits no edema.  1+ DP bilaterally FROM at knees, ankles without discomfort Tender to palpation along R tibia, mild discomfort to palpation distal fibula without point tenderness  Neurological: She is alert. She has normal strength. She displays no atrophy. No sensory deficit. Coordination and gait normal.  Diminished sensation to light touch from R foot to just above ankle Skin R>L cool to touch Normal stance  Skin: Skin is warm. Capillary refill takes less than 2 seconds. No rash noted. No erythema.  Nursing note and vitals reviewed.  Results for orders placed or performed in visit on 02/27/17  Comprehensive metabolic panel  Result Value Ref Range   Sodium 138 135 - 145 mEq/L   Potassium 4.2 3.5 - 5.1 mEq/L   Chloride 98 96 - 112 mEq/L   CO2 30 19 - 32 mEq/L   Glucose, Bld 99 70 - 99 mg/dL   BUN 10 6 - 23 mg/dL   Creatinine, Ser 0.92 0.40 - 1.20 mg/dL   Total Bilirubin 0.6 0.2 - 1.2 mg/dL   Alkaline Phosphatase 77 39 - 117 U/L   AST 18 0 - 37 U/L   ALT 18 0 - 35 U/L   Total Protein 7.5 6.0 - 8.3 g/dL   Albumin 4.3 3.5 - 5.2 g/dL   Calcium 10.4 8.4 - 10.5 mg/dL   GFR 68.45 >60.00 mL/min  TSH  Result Value Ref  Range   TSH 1.00 0.35 - 4.50 uIU/mL  CBC with Differential/Platelet  Result Value Ref Range   WBC 7.0 4.0 - 10.5 K/uL   RBC 5.23 (H) 3.87 - 5.11 Mil/uL   Hemoglobin 15.2 (H) 12.0 - 15.0 g/dL   HCT 43.7 36.0 - 46.0 %   MCV 83.5 78.0 - 100.0 fl   MCHC 34.9 30.0 - 36.0 g/dL  RDW 13.4 11.5 - 15.5 %   Platelets 341.0 150.0 - 400.0 K/uL   Neutrophils Relative % 61.5 43.0 - 77.0 %   Lymphocytes Relative 31.6 12.0 - 46.0 %   Monocytes Relative 6.9 3.0 - 12.0 %   Eosinophils Relative 0.0 0.0 - 5.0 %   Basophils Relative 0.0 0.0 - 3.0 %   Neutro Abs 4.3 1.4 - 7.7 K/uL   Lymphs Abs 2.2 0.7 - 4.0 K/uL   Monocytes Absolute 0.5 0.1 - 1.0 K/uL   Eosinophils Absolute 0.0 0.0 - 0.7 K/uL   Basophils Absolute 0.0 0.0 - 0.1 K/uL  Hemoglobin A1c  Result Value Ref Range   Hgb A1c MFr Bld 5.6 4.6 - 6.5 %  Brain natriuretic peptide  Result Value Ref Range   Pro B Natriuretic peptide (BNP) 5.0 0.0 - 100.0 pg/mL  Lipase  Result Value Ref Range   Lipase 15.0 11.0 - 59.0 U/L  Troponin I  Result Value Ref Range   TNIDX 0.00 0.00 - 0.06 ug/l  POCT Glucose (Device for Home Use)  Result Value Ref Range   Glucose Fasting, POC  70 - 99 mg/dL   POC Glucose 97 70 - 99 mg/dl  POCT Urinalysis Dipstick (Automated)  Result Value Ref Range   Color, UA yellow    Clarity, UA clear    Glucose, UA negative    Bilirubin, UA negative    Ketones, UA negative    Spec Grav, UA <=1.005 (A) 1.010 - 1.025   Blood, UA negative    pH, UA 7.5 5.0 - 8.0   Protein, UA negative    Urobilinogen, UA 0.2 0.2 or 1.0 E.U./dL   Nitrite, UA negative    Leukocytes, UA Negative Negative      Assessment & Plan:   Problem List Items Addressed This Visit    Vitamin D deficiency    Update lab - she is currently taking 2000 IU vit D replacement.       Right leg pain - Primary    Anticipate combination of chronic muscle spasm/cramping + known neuropathy. Describes ?progressively worsening R bone pain and restless sensation.  Check labs today - CPK, Mg, Phos, vit D - check tib/fib film. Update with results, consider requip to treat possible RLS. Check iron panel today. Pt agrees with plan.       Relevant Orders   Basic metabolic panel   CK   Ferritin   IBC panel   DG Tibia/Fibula Right   Magnesium   Iron deficiency    H/o this. No anemia on recent CBC. Update iron panel.       Fibromyalgia   DDD (degenerative disc disease), lumbar   Chronic pain syndrome   Arachnoiditis       No orders of the defined types were placed in this encounter.  Orders Placed This Encounter  Procedures  . DG Tibia/Fibula Right    Standing Status:   Future    Number of Occurrences:   1    Standing Expiration Date:   11/18/2018    Order Specific Question:   Reason for Exam (SYMPTOM  OR DIAGNOSIS REQUIRED)    Answer:   R lower leg bone pain    Order Specific Question:   Is patient pregnant?    Answer:   No    Order Specific Question:   Preferred imaging location?    Answer:   [facility]    Order Specific Question:   Radiology Contrast Protocol - do  NOT remove file path    Answer:   [email or web address]  . Basic metabolic panel  . CK  . Ferritin  . IBC panel  . Magnesium    Follow up plan: Return if symptoms worsen or fail to improve.  Ria Bush, MD

## 2017-09-17 NOTE — Assessment & Plan Note (Signed)
H/o this. No anemia on recent CBC. Update iron panel.

## 2017-09-17 NOTE — Patient Instructions (Addendum)
Labs today. Xray today.  We will be in touch with results.  ?restless leg syndrome.

## 2017-09-17 NOTE — Assessment & Plan Note (Signed)
Anticipate combination of chronic muscle spasm/cramping + known neuropathy. Describes ?progressively worsening R bone pain and restless sensation. Check labs today - CPK, Mg, Phos, vit D - check tib/fib film. Update with results, consider requip to treat possible RLS. Check iron panel today. Pt agrees with plan.

## 2017-09-20 ENCOUNTER — Encounter: Payer: Self-pay | Admitting: Family Medicine

## 2017-09-20 NOTE — Telephone Encounter (Signed)
Results released via mychart. 

## 2017-09-22 ENCOUNTER — Encounter: Payer: Self-pay | Admitting: Family Medicine

## 2017-09-24 ENCOUNTER — Ambulatory Visit: Payer: No Typology Code available for payment source | Admitting: Psychology

## 2017-09-24 MED ORDER — ROPINIROLE HCL 0.25 MG PO TABS: 0.2500 mg | ORAL_TABLET | Freq: Every day | ORAL | 3 refills | Status: DC

## 2017-09-27 ENCOUNTER — Other Ambulatory Visit (INDEPENDENT_AMBULATORY_CARE_PROVIDER_SITE_OTHER): Payer: Self-pay | Admitting: Orthopaedic Surgery

## 2017-10-09 DIAGNOSIS — Z79891 Long term (current) use of opiate analgesic: Secondary | ICD-10-CM | POA: Diagnosis not present

## 2017-10-09 DIAGNOSIS — M961 Postlaminectomy syndrome, not elsewhere classified: Secondary | ICD-10-CM | POA: Diagnosis not present

## 2017-10-09 DIAGNOSIS — M62831 Muscle spasm of calf: Secondary | ICD-10-CM | POA: Diagnosis not present

## 2017-10-09 DIAGNOSIS — G894 Chronic pain syndrome: Secondary | ICD-10-CM | POA: Diagnosis not present

## 2017-10-14 DIAGNOSIS — E038 Other specified hypothyroidism: Secondary | ICD-10-CM | POA: Diagnosis not present

## 2017-10-14 DIAGNOSIS — E063 Autoimmune thyroiditis: Secondary | ICD-10-CM | POA: Diagnosis not present

## 2017-10-31 ENCOUNTER — Other Ambulatory Visit: Payer: Self-pay | Admitting: Family Medicine

## 2017-11-01 NOTE — Telephone Encounter (Signed)
Eprescribed.

## 2017-11-13 ENCOUNTER — Encounter: Payer: Self-pay | Admitting: Family Medicine

## 2017-11-20 DIAGNOSIS — M62831 Muscle spasm of calf: Secondary | ICD-10-CM | POA: Diagnosis not present

## 2017-11-20 DIAGNOSIS — G894 Chronic pain syndrome: Secondary | ICD-10-CM | POA: Diagnosis not present

## 2017-11-20 DIAGNOSIS — Z79891 Long term (current) use of opiate analgesic: Secondary | ICD-10-CM | POA: Diagnosis not present

## 2017-11-20 DIAGNOSIS — M961 Postlaminectomy syndrome, not elsewhere classified: Secondary | ICD-10-CM | POA: Diagnosis not present

## 2017-12-01 IMAGING — CT CT CERVICAL SPINE WO/W CM
3 of 5 series · 9 of 27 positions shown, 11 images · non-contrast
Comparison: None.

CLINICAL DATA: Right arm numbness radiating to the right hand.

EXAM:
CT CERVICAL SPINE WITH AND WITHOUT CONTRAST
TECHNIQUE: Multidetector CT imaging of the cervical spine was performed with
and without intravenous contrast. Multiplanar CT image
reconstructions were also generated.

[Series 3: c spine soft · axial · 0.27mm/px · z∈[-151,-97]mm · 2 of 81 slices shown]
[im 27/81  soft-tissue]
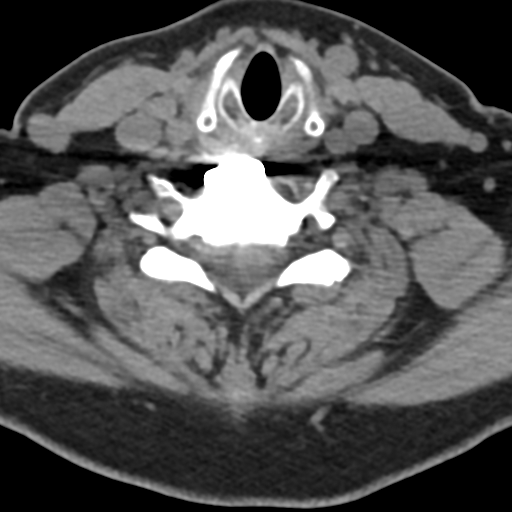
[im 54/81  soft-tissue]
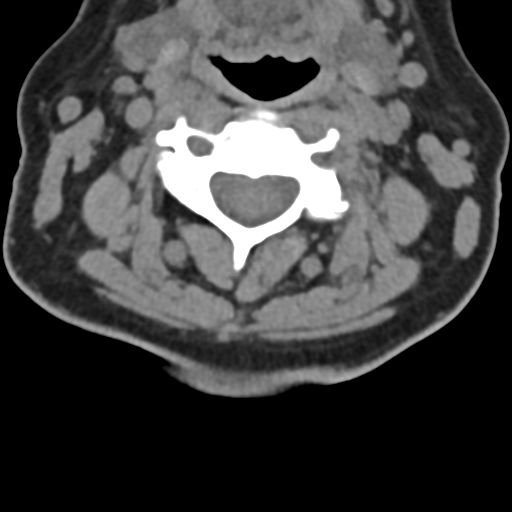

[Series 14: orthogonal bone · axial · 0.20mm/px · z∈[-169,-108]mm · 2 of 96 slices shown, 3 images]
[im 32/96  soft-tissue]
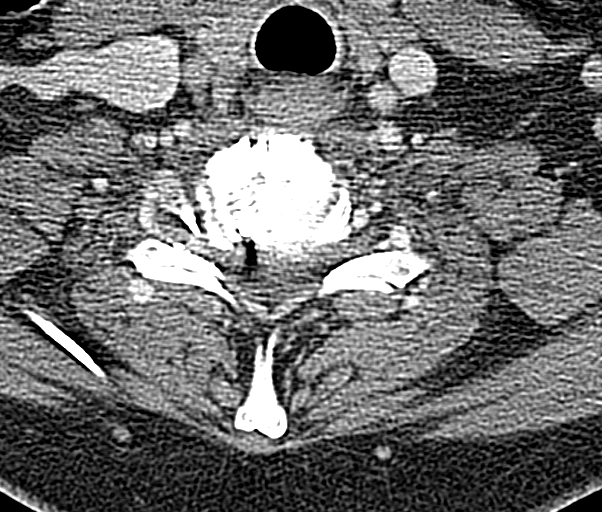
[im 32/96  bone]
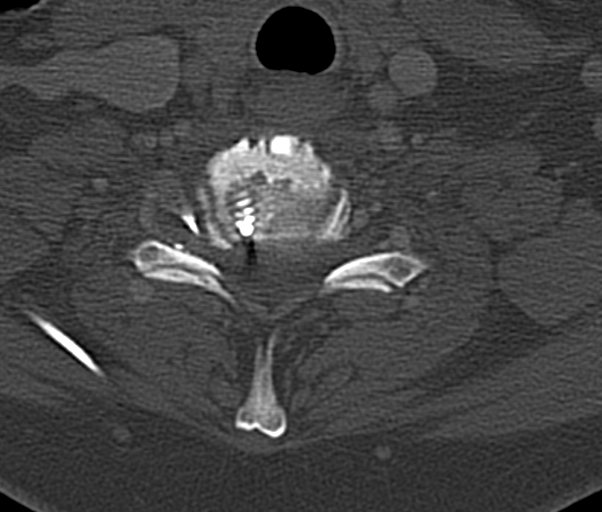
[im 64/96  bone]
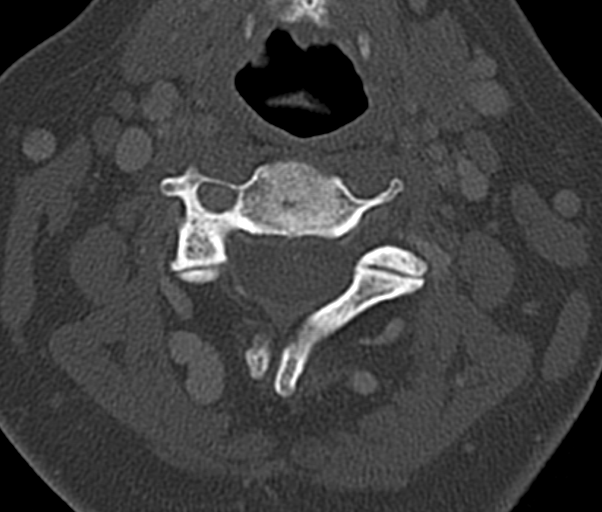

[Series 503: c spine soft sag · sagittal · 0.32mm/px · 5 of 68 slices shown, 6 images]
[im 23/68  bone]
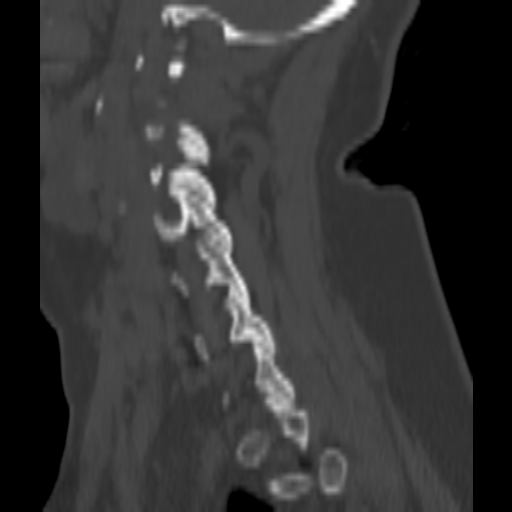
[im 28/68  bone]
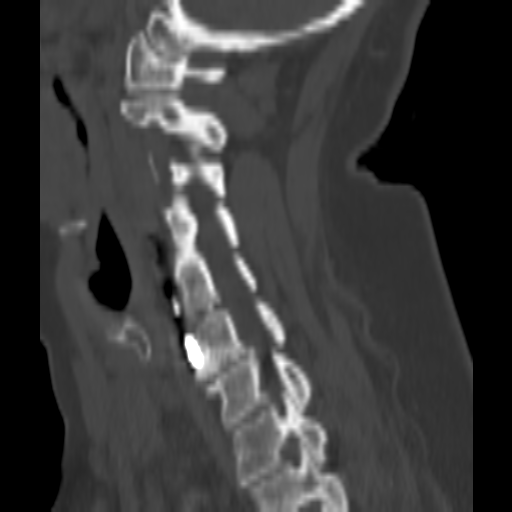
[im 34/68  soft-tissue]
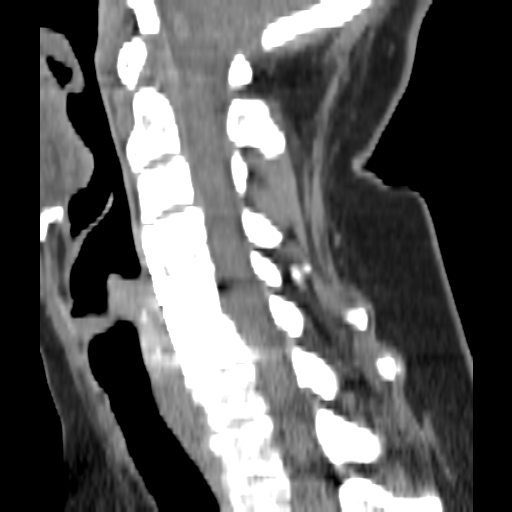
[im 34/68  bone]
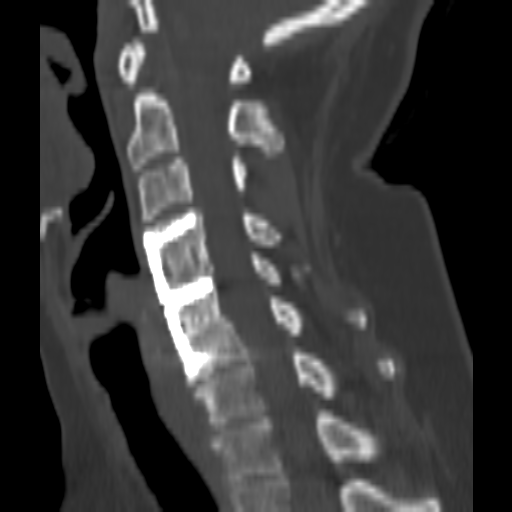
[im 40/68  bone]
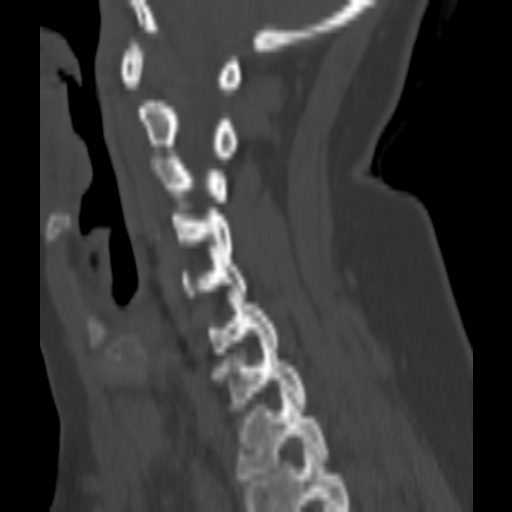
[im 45/68  bone]
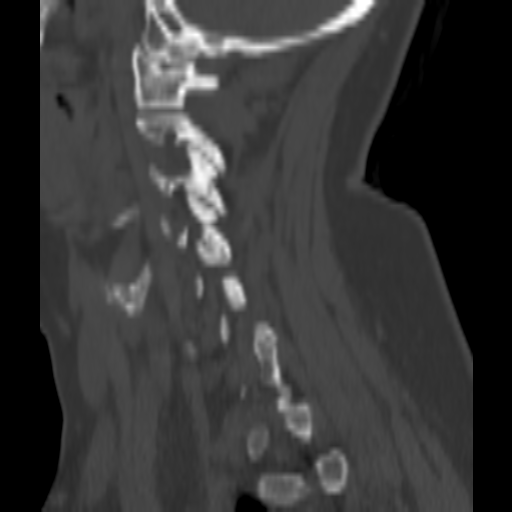

[9 of 27 positions shown; findings below may reference images not displayed]

FINDINGS: Alignment: Normal

Skull base and vertebrae: No fracture. There is ACDF hardware from
C4-C6 with solid osseous fusion.

Soft tissues and spinal canal: No prevertebral fluid or swelling. No
visible canal hematoma.

Disc levels:

C1-C2:  Normal

C2-C3:  Normal

C3-C4: Bilateral uncovertebral hypertrophy, left worse than right
with small disc bulge. No spinal canal stenosis. No neural foraminal
stenosis.

C4-C5: Postfusion changes with solid osseous fusion mass anteriorly.
No spinal canal stenosis or neural foraminal stenosis.

C5-C6: Postfusion changes with solid anterior fusion mass. No spinal
canal or neural foraminal stenosis.

C6-C7: Moderate right and mild-to-moderate left neural foraminal
stenosis due to uncovertebral hypertrophy. There is contrast
enhancement of the right-sided emissary veins at this level.

C7-T1: Right-sided uncovertebral hypertrophy with mild foraminal
stenosis.

Upper chest: Negative.

Other: None.
IMPRESSION: 1. No acute abnormality of the cervical spine.
2. Allowing for differences in modality, lower adjacent segment
disease at C6-C7 with moderate right and mild-to-moderate left
neural foraminal stenosis appears unchanged.
3. Right-sided C7-T1 uncovertebral hypertrophy mildly narrows the
neural foramen.
4. Upper adjacent segment disease at C3-C4 without associated
stenosis.

## 2017-12-30 ENCOUNTER — Other Ambulatory Visit: Payer: Self-pay | Admitting: Family Medicine

## 2017-12-30 ENCOUNTER — Other Ambulatory Visit (INDEPENDENT_AMBULATORY_CARE_PROVIDER_SITE_OTHER): Payer: Self-pay | Admitting: Orthopaedic Surgery

## 2017-12-30 ENCOUNTER — Encounter: Payer: Self-pay | Admitting: Family Medicine

## 2017-12-31 NOTE — Telephone Encounter (Signed)
Name of Medication: Clonazepam 0.5 mg Name of [facility]acy: [facility] Date and Quantity: 11/01/17, #60/0 Last Office Visit and Type: 09/17/17, f/u Next Office Visit and Type: none Last Controlled Substance Agreement Date: 06/10/13 Last UDS: 06/10/13

## 2017-12-31 NOTE — Telephone Encounter (Signed)
E perscribed 

## 2018-01-02 ENCOUNTER — Encounter: Payer: Self-pay | Admitting: Family Medicine

## 2018-01-02 ENCOUNTER — Ambulatory Visit: Payer: Medicare Other | Admitting: Family Medicine

## 2018-01-02 VITALS — BP 130/68 | HR 77 | Temp 98.4°F | Ht 67.75 in | Wt 173.2 lb

## 2018-01-02 DIAGNOSIS — E611 Iron deficiency: Secondary | ICD-10-CM | POA: Diagnosis not present

## 2018-01-02 DIAGNOSIS — R197 Diarrhea, unspecified: Secondary | ICD-10-CM

## 2018-01-02 LAB — CBC WITH DIFFERENTIAL/PLATELET
BASOS PCT: 0.1 % (ref 0.0–3.0)
Basophils Absolute: 0 10*3/uL (ref 0.0–0.1)
Eosinophils Absolute: 0 10*3/uL (ref 0.0–0.7)
Eosinophils Relative: 0 % (ref 0.0–5.0)
HEMATOCRIT: 43.3 % (ref 36.0–46.0)
Hemoglobin: 14.9 g/dL (ref 12.0–15.0)
LYMPHS PCT: 35.9 % (ref 12.0–46.0)
Lymphs Abs: 1.8 10*3/uL (ref 0.7–4.0)
MCHC: 34.4 g/dL (ref 30.0–36.0)
MCV: 84.4 fl (ref 78.0–100.0)
Monocytes Absolute: 0.3 10*3/uL (ref 0.1–1.0)
Monocytes Relative: 5.8 % (ref 3.0–12.0)
Neutro Abs: 2.9 10*3/uL (ref 1.4–7.7)
Neutrophils Relative %: 58.2 % (ref 43.0–77.0)
Platelets: 302 10*3/uL (ref 150.0–400.0)
RBC: 5.13 Mil/uL — ABNORMAL HIGH (ref 3.87–5.11)
RDW: 13.1 % (ref 11.5–15.5)
WBC: 4.9 10*3/uL (ref 4.0–10.5)

## 2018-01-02 LAB — COMPREHENSIVE METABOLIC PANEL
ALT: 12 U/L (ref 0–35)
AST: 13 U/L (ref 0–37)
Albumin: 4.1 g/dL (ref 3.5–5.2)
Alkaline Phosphatase: 78 U/L (ref 39–117)
BUN: 4 mg/dL — ABNORMAL LOW (ref 6–23)
CO2: 34 mEq/L — ABNORMAL HIGH (ref 19–32)
Calcium: 9.4 mg/dL (ref 8.4–10.5)
Chloride: 96 mEq/L (ref 96–112)
Creatinine, Ser: 0.94 mg/dL (ref 0.40–1.20)
GFR: 66.55 mL/min (ref >60.00)
Glucose, Bld: 104 mg/dL — ABNORMAL HIGH (ref 70–99)
Potassium: 3.5 mEq/L (ref 3.5–5.1)
Sodium: 137 mEq/L (ref 135–145)
Total Bilirubin: 0.5 mg/dL (ref 0.2–1.2)
Total Protein: 6.9 g/dL (ref 6.0–8.3)

## 2018-01-02 LAB — FERRITIN: Ferritin: 22 ng/mL (ref 10.0–291.0)

## 2018-01-02 LAB — LIPASE: Lipase: 44 U/L (ref 11.0–59.0)

## 2018-01-02 MED ORDER — CIPROFLOXACIN HCL 500 MG PO TABS: 500.0000 mg | ORAL_TABLET | Freq: Two times a day (BID) | ORAL | 0 refills | Status: DC

## 2018-01-02 NOTE — Assessment & Plan Note (Signed)
H/o IBS but this seems different - and not consistent with diverticulitis. Anticipate infectious cause. She seems to be maintaining good hydration status so ok for outpt workup. Check labwork today including CBC, CMP, lipase as well as GI pathogen panel. Treat empirically for bacterial diarrhea with cipro 3d course. continue bland diet. Pt agrees with plan.

## 2018-01-02 NOTE — Patient Instructions (Signed)
Come back in for flu shot and Tdap (tetanus and pertussis) when feeling better. Labs today, stool test today. After collecting stool sample, start antibiotic course (500mg  cipro twice daily for 3 days).  We will be in touch with results. Bland diet, clear liquid diet over next few days.

## 2018-01-02 NOTE — Assessment & Plan Note (Signed)
Update iron stores on QOD oral iron for last 3 months.

## 2018-01-02 NOTE — Progress Notes (Signed)
BP 130/68 (BP Location: Right Arm, Patient Position: Sitting, Cuff Size: Normal)   Pulse 77   Temp 98.4 F (36.9 C) (Oral)   Ht 5' 7.75" (1.721 m)   Wt 173 lb 4 oz (78.6 kg)   LMP 10/04/2014 Comment: per labs  SpO2 99%   BMI 26.54 kg/m    CC: diarrhea Subjective:    Patient ID: Karen Aguilar, female    DOB: 1965-06-24, 53 y.o.   MRN: [id number]  HPI: Karen Aguilar is a 53 y.o. female presenting on 01/02/2018 for Iron defiency (per pt) and Diarrhea (x1 week)   9d h/o loose/watery diarrhea several times a day. + abd pain, cramping, even diarrhea at night. Nausea, gassiness.    No fevers/chills, blood or mucous in stool (some blood with wiping due to hemorrhoids). No greasy or floating stools. No black tarry stools. No vomiting.  Bland diet over last few days hasn't helped.  No sick contacts at home. Denies new travel or new foods. No new medicines.  She has been using desitin to bottom. She has been taking imodium to help control diarrhea.   New granddaughter Rinaldo Cloud!   Requests labwork for iron level f/u. Not taking oral iron at this time. Has not been taking armour thyroid.   Allergic to several abx.      Relevant past medical, surgical, family and social history reviewed and updated as indicated. Interim medical history since our last visit reviewed. Allergies and medications reviewed and updated. Outpatient Medications Prior to Visit  Medication Sig Dispense Refill  . Ascorbic Acid (VITAMIN C PO) Take 1 tablet by mouth as needed (for immune system support).     . baclofen (LIORESAL) 10 MG tablet Take 15 mg by mouth 3 (three) times daily. Takes 1.5 tablets 3 times daily    . calcium carbonate (TUMS SMOOTHIES) 750 MG chewable tablet Chew 2-3 tablets by mouth 2 (two) times daily.    . camphor-menthol (SARNA) lotion Apply 1 application topically at bedtime as needed for itching.     . Cholecalciferol (VITAMIN D) 2000 units CAPS Take 1 capsule (2,000 Units total) by mouth  daily. 30 capsule   . clobetasol ointment (TEMOVATE) 7.09 % Apply 1 application topically as needed (for eczema).     . clonazePAM (KLONOPIN) 0.5 MG tablet TAKE 1 TABLET BY MOUTH TWICE DAILY AS NEEDED FOR ANXIETY 60 tablet 0  . diclofenac sodium (VOLTAREN) 1 % GEL APPLY THIN LAYER EXTERNALLY TO THE AFFECTED AREA UP TO FOUR TIMES DAILY AS NEEDED 300 g 0  . dronabinol (MARINOL) 2.5 MG capsule Take 2.5 mg by mouth daily as needed (for nausea and pain).   0  . hyoscyamine (LEVSIN SL) 0.125 MG SL tablet DISSOLVE 1 TABLET(0.125 MG) UNDER THE TONGUE EVERY 4 HOURS AS NEEDED 30 tablet 0  . metroNIDAZOLE (METROGEL) 0.75 % gel Apply 1 application topically 2 (two) times daily. 45 g 3  . NON FORMULARY Place 1 drop under the tongue 3 (three) times daily. Hemp oil    . NUCYNTA 100 MG TABS Take 1 tablet by mouth every 6 (six) hours.  0  . Phenylephrine-DM-GG-APAP (MUCINEX SINUS-MAX) 5-10-200-325 MG CAPS Take 2 tablets by mouth 2 (two) times daily as needed (for migraine).     . promethazine (PHENERGAN) 25 MG tablet Take 1 tablet (25 mg total) by mouth every 8 (eight) hours as needed for nausea or vomiting. 30 tablet 0  . sucralfate (CARAFATE) 1 g tablet Take 1 tablet (1  g total) by mouth 2 (two) times daily before a meal. As needed 120 tablet 0  . thyroid (ARMOUR) 60 MG tablet Take 60 mg by mouth daily before breakfast. Three days a week take extra tablet daily    . IRON PO Take 20 mg by mouth every other day.    Marland Kitchen DEXILANT 60 MG capsule TAKE 1 CAPSULE(60 MG) BY MOUTH TWICE DAILY 60 capsule 3  . rOPINIRole (REQUIP) 0.25 MG tablet Take 1 tablet (0.25 mg total) by mouth at bedtime. 30 tablet 3   No facility-administered medications prior to visit.      Per HPI unless specifically indicated in ROS section below Review of Systems Objective:    BP 130/68 (BP Location: Right Arm, Patient Position: Sitting, Cuff Size: Normal)   Pulse 77   Temp 98.4 F (36.9 C) (Oral)   Ht 5' 7.75" (1.721 m)   Wt 173 lb 4 oz  (78.6 kg)   LMP 10/04/2014 Comment: per labs  SpO2 99%   BMI 26.54 kg/m   Wt Readings from Last 3 Encounters:  01/02/18 173 lb 4 oz (78.6 kg)  09/17/17 175 lb 8 oz (79.6 kg)  06/17/17 175 lb 8 oz (79.6 kg)    Physical Exam Vitals signs and nursing note reviewed.  Constitutional:      General: She is not in acute distress.    Appearance: Normal appearance.     Comments: tired appearing  HENT:     Head: Normocephalic and atraumatic.     Mouth/Throat:     Mouth: Mucous membranes are moist.     Pharynx: Oropharynx is clear.  Cardiovascular:     Rate and Rhythm: Normal rate and regular rhythm.     Pulses: Normal pulses.     Heart sounds: Normal heart sounds. No murmur.  Pulmonary:     Effort: Pulmonary effort is normal. No respiratory distress.     Breath sounds: Normal breath sounds. No wheezing, rhonchi or rales.  Abdominal:     General: Abdomen is flat. Bowel sounds are normal. There is no distension.     Palpations: Abdomen is soft. There is no mass.     Tenderness: There is no abdominal tenderness. There is no right CVA tenderness, left CVA tenderness, guarding or rebound.     Hernia: No hernia is present.  Musculoskeletal:     Right lower leg: No edema.     Left lower leg: No edema.  Neurological:     Mental Status: She is alert.  Psychiatric:        Mood and Affect: Mood normal.       Results for orders placed or performed in visit on 01/74/94  Basic metabolic panel  Result Value Ref Range   Sodium 141 135 - 145 mEq/L   Potassium 4.1 3.5 - 5.1 mEq/L   Chloride 101 96 - 112 mEq/L   CO2 30 19 - 32 mEq/L   Glucose, Bld 94 70 - 99 mg/dL   BUN 16 6 - 23 mg/dL   Creatinine, Ser 0.85 0.40 - 1.20 mg/dL   Calcium 9.8 8.4 - 10.5 mg/dL   GFR 74.83 >60.00 mL/min  CK  Result Value Ref Range   Total CK 51 7 - 177 U/L  Ferritin  Result Value Ref Range   Ferritin 13.3 10.0 - 291.0 ng/mL  IBC panel  Result Value Ref Range   Iron 102 42 - 145 ug/dL   Transferrin 325.0  212.0 - 360.0 mg/dL  Saturation Ratios 22.4 20.0 - 50.0 %  Magnesium  Result Value Ref Range   Magnesium 2.0 1.5 - 2.5 mg/dL   Assessment & Plan:  Discussed influenza and Tdap vaccinations with newborn grand daughter - she will return for these when feeling better. Problem List Items Addressed This Visit    Iron deficiency    Update iron stores on QOD oral iron for last 3 months.       Relevant Orders   Ferritin   Diarrhea - Primary    H/o IBS but this seems different - and not consistent with diverticulitis. Anticipate infectious cause. She seems to be maintaining good hydration status so ok for outpt workup. Check labwork today including CBC, CMP, lipase as well as GI pathogen panel. Treat empirically for bacterial diarrhea with cipro 3d course. continue bland diet. Pt agrees with plan.       Relevant Orders   Comprehensive metabolic panel   CBC with Differential/Platelet   Lipase   Gastrointestinal Pathogen Panel PCR       Meds ordered this encounter  Medications  . ciprofloxacin (CIPRO) 500 MG tablet    Sig: Take 1 tablet (500 mg total) by mouth 2 (two) times daily.    Dispense:  6 tablet    Refill:  0   Orders Placed This Encounter  Procedures  . Comprehensive metabolic panel  . CBC with Differential/Platelet  . Lipase  . Ferritin  . Gastrointestinal Pathogen Panel PCR    Follow up plan: Return if symptoms worsen or fail to improve.  Ria Bush, MD

## 2018-01-02 NOTE — Telephone Encounter (Signed)
Seen today. 

## 2018-01-03 ENCOUNTER — Ambulatory Visit: Payer: Self-pay | Admitting: Family Medicine

## 2018-01-03 NOTE — Addendum Note (Signed)
Addended by: Ellamae Sia on: 01/03/2018 02:27 PM   Modules accepted: Orders

## 2018-01-09 DIAGNOSIS — R197 Diarrhea, unspecified: Secondary | ICD-10-CM | POA: Diagnosis not present

## 2018-01-09 NOTE — Addendum Note (Signed)
Addended by: Ellamae Sia on: 01/09/2018 12:15 PM   Modules accepted: Orders

## 2018-01-10 LAB — GASTROINTESTINAL PATHOGEN PANEL PCR
C. difficile Tox A/B, PCR: NOT DETECTED
Campylobacter, PCR: NOT DETECTED
Cryptosporidium, PCR: NOT DETECTED
E COLI (ETEC) LT/ST, PCR: NOT DETECTED
E coli (STEC) stx1/stx2, PCR: NOT DETECTED
E coli 0157, PCR: NOT DETECTED
Giardia lamblia, PCR: NOT DETECTED
Norovirus, PCR: NOT DETECTED
Rotavirus A, PCR: NOT DETECTED
Salmonella, PCR: NOT DETECTED
Shigella, PCR: NOT DETECTED

## 2018-01-13 ENCOUNTER — Encounter: Payer: Self-pay | Admitting: Family Medicine

## 2018-01-15 ENCOUNTER — Encounter: Payer: Self-pay | Admitting: Family Medicine

## 2018-01-15 DIAGNOSIS — M961 Postlaminectomy syndrome, not elsewhere classified: Secondary | ICD-10-CM | POA: Diagnosis not present

## 2018-01-15 DIAGNOSIS — G894 Chronic pain syndrome: Secondary | ICD-10-CM | POA: Diagnosis not present

## 2018-01-15 DIAGNOSIS — Z79891 Long term (current) use of opiate analgesic: Secondary | ICD-10-CM | POA: Diagnosis not present

## 2018-01-15 DIAGNOSIS — M62831 Muscle spasm of calf: Secondary | ICD-10-CM | POA: Diagnosis not present

## 2018-01-15 MED ORDER — HYOSCYAMINE SULFATE 0.125 MG SL SUBL: SUBLINGUAL_TABLET | SUBLINGUAL | 3 refills | Status: DC

## 2018-01-30 ENCOUNTER — Other Ambulatory Visit: Payer: Self-pay | Admitting: Family Medicine

## 2018-01-31 NOTE — Telephone Encounter (Signed)
Name of Medication: Clonazepam Name of [facility] Date and Quantity: 12/31/17, #60/0 Last Office Visit and Type: 01/02/18, acute Next Office Visit and Type: none Last Controlled Substance Agreement Date: 06/10/13 Last UDS: 06/10/13

## 2018-02-01 NOTE — Telephone Encounter (Signed)
Eprescribed.

## 2018-03-03 ENCOUNTER — Other Ambulatory Visit: Payer: Self-pay | Admitting: Family Medicine

## 2018-03-03 NOTE — Telephone Encounter (Signed)
Name of Medication: Clonazepam Name of [facility]: [facility] Date and Quantity: 02/01/18, #60 Last Office Visit and Type: 01/02/18, acute Next Office Visit and Type: none Last Controlled Substance Agreement Date: 06/10/13 Last UDS: 06/10/13

## 2018-03-04 NOTE — Telephone Encounter (Signed)
E perscribed 

## 2018-03-12 DIAGNOSIS — M961 Postlaminectomy syndrome, not elsewhere classified: Secondary | ICD-10-CM | POA: Diagnosis not present

## 2018-03-12 DIAGNOSIS — G894 Chronic pain syndrome: Secondary | ICD-10-CM | POA: Diagnosis not present

## 2018-03-12 DIAGNOSIS — M62831 Muscle spasm of calf: Secondary | ICD-10-CM | POA: Diagnosis not present

## 2018-03-12 DIAGNOSIS — Z79891 Long term (current) use of opiate analgesic: Secondary | ICD-10-CM | POA: Diagnosis not present

## 2018-04-14 ENCOUNTER — Ambulatory Visit (INDEPENDENT_AMBULATORY_CARE_PROVIDER_SITE_OTHER): Payer: Medicare Other | Admitting: Family Medicine

## 2018-04-14 ENCOUNTER — Encounter: Payer: Self-pay | Admitting: Family Medicine

## 2018-04-14 ENCOUNTER — Other Ambulatory Visit: Payer: Self-pay

## 2018-04-14 ENCOUNTER — Other Ambulatory Visit: Payer: Medicare Other

## 2018-04-14 VITALS — Temp 99.2°F | Ht 67.75 in | Wt 174.0 lb

## 2018-04-14 DIAGNOSIS — R1030 Lower abdominal pain, unspecified: Secondary | ICD-10-CM | POA: Insufficient documentation

## 2018-04-14 DIAGNOSIS — N3946 Mixed incontinence: Secondary | ICD-10-CM | POA: Diagnosis not present

## 2018-04-14 DIAGNOSIS — M545 Low back pain, unspecified: Secondary | ICD-10-CM

## 2018-04-14 DIAGNOSIS — N319 Neuromuscular dysfunction of bladder, unspecified: Secondary | ICD-10-CM | POA: Diagnosis not present

## 2018-04-14 LAB — POC URINALSYSI DIPSTICK (AUTOMATED)
Bilirubin, UA: NEGATIVE
Blood, UA: NEGATIVE
Glucose, UA: NEGATIVE
Ketones, UA: NEGATIVE
Leukocytes, UA: NEGATIVE
Nitrite, UA: NEGATIVE
Protein, UA: NEGATIVE
Spec Grav, UA: 1.015 (ref 1.010–1.025)
Urobilinogen, UA: 0.2 E.U./dL
pH, UA: 5.5 (ref 5.0–8.0)

## 2018-04-14 LAB — CBC WITH DIFFERENTIAL/PLATELET
Basophils Absolute: 0 10*3/uL (ref 0.0–0.1)
Basophils Relative: 0.1 % (ref 0.0–3.0)
Eosinophils Absolute: 0 10*3/uL (ref 0.0–0.7)
Eosinophils Relative: 0 % (ref 0.0–5.0)
HCT: 39.9 % (ref 36.0–46.0)
Hemoglobin: 13.9 g/dL (ref 12.0–15.0)
Lymphocytes Relative: 26.1 % (ref 12.0–46.0)
Lymphs Abs: 2.1 10*3/uL (ref 0.7–4.0)
MCHC: 34.9 g/dL (ref 30.0–36.0)
MCV: 86.6 fl (ref 78.0–100.0)
Monocytes Absolute: 0.6 10*3/uL (ref 0.1–1.0)
Monocytes Relative: 8.2 % (ref 3.0–12.0)
Neutro Abs: 5.2 10*3/uL (ref 1.4–7.7)
Neutrophils Relative %: 65.6 % (ref 43.0–77.0)
Platelets: 297 10*3/uL (ref 150.0–400.0)
RBC: 4.6 Mil/uL (ref 3.87–5.11)
RDW: 13.1 % (ref 11.5–15.5)
WBC: 7.9 10*3/uL (ref 4.0–10.5)

## 2018-04-14 LAB — COMPREHENSIVE METABOLIC PANEL
ALT: 10 U/L (ref 0–35)
AST: 13 U/L (ref 0–37)
Albumin: 4.1 g/dL (ref 3.5–5.2)
Alkaline Phosphatase: 72 U/L (ref 39–117)
BUN: 12 mg/dL (ref 6–23)
CO2: 33 mEq/L — ABNORMAL HIGH (ref 19–32)
Calcium: 9.5 mg/dL (ref 8.4–10.5)
Chloride: 99 mEq/L (ref 96–112)
Creatinine, Ser: 0.81 mg/dL (ref 0.40–1.20)
GFR: 74.27 mL/min (ref >60.00)
Glucose, Bld: 106 mg/dL — ABNORMAL HIGH (ref 70–99)
Potassium: 3.9 mEq/L (ref 3.5–5.1)
Sodium: 138 mEq/L (ref 135–145)
Total Bilirubin: 0.5 mg/dL (ref 0.2–1.2)
Total Protein: 7 g/dL (ref 6.0–8.3)

## 2018-04-14 NOTE — Assessment & Plan Note (Signed)
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 Will ask patient to come in for labs and urinalysis to evaluate for urinary tract infection as cause. Pt agrees with plan.

## 2018-04-14 NOTE — Progress Notes (Signed)
Virtual visit completed through [email or web address]. Due to national recommendations of social distancing due to [place] 19, a virtual visit is felt to be most appropriate for this patient at this time.  Interactive audio and video telecommunications were attempted between myself and Karen Aguilar, however failed due to patient having technical difficulties. We continued and completed visit with audio only (although she could see me well).  Time: 12:20pm - 12:36pm   Patient location: home  Provider location: [place] at [facility], office  If any vitals were documented, they were collected by patient at home unless specified below.    Temp 99.2 F (37.3 C) (Oral)   Ht 5' 7.75" (1.721 m)   Wt 174 lb (78.9 kg)   LMP 10/04/2014 Comment: per labs  BMI 26.65 kg/m    CC: ?UTI Subjective:    Patient ID: Karen Aguilar, female    DOB: 08-Feb-1965, 53 y.o.   MRN: [id number]  HPI: Karen Aguilar is a 53 y.o. female presenting on 04/14/2018 for Back Pain (C/o low back pain radiating around to abd, worse at night. Denies any dysuria but c/o urinary frequency. Thinks it she may have kidney infections. Sxs started 04/12/18. )   3-4 d h/o lower back pain with radiation to bilateral lower abdomen R>L, no groin pain. Some pain shooting down legs. Noticing increasing urinary frequency but no dysuria. Longstanding urinary urgency. Known h/o neurogenic bladder. Yesterday was especially bad day - with increased joint pains, possibly feverish. Pain worse at night time while in bed. Some chills. Chest feels heavy. Ongoing loose stools alternating with constipation. 2-3 BMs with blood when wiping, last noted a few weeks ago.   Chronic GERD.  No nausea/vomiting, no blood in stool or urine, mucous in stool.  No cough, dyspnea, wheezing, head congestion, ST.   She is having allergic symptoms of rhinorrhea and sneezing when she goes outdoors. No sick contacts at home.       Relevant past medical, surgical, family and  social history reviewed and updated as indicated. Interim medical history since our last visit reviewed. Allergies and medications reviewed and updated. Outpatient Medications Prior to Visit  Medication Sig Dispense Refill  . Ascorbic Acid (VITAMIN C PO) Take 1 tablet by mouth as needed (for immune system support).     . baclofen (LIORESAL) 10 MG tablet Take 15 mg by mouth 3 (three) times daily. Takes 1.5 tablets 3 times daily    . calcium carbonate (TUMS SMOOTHIES) 750 MG chewable tablet Chew 2-3 tablets by mouth 2 (two) times daily.    . camphor-menthol (SARNA) lotion Apply 1 application topically at bedtime as needed for itching.     . clobetasol ointment (TEMOVATE) 2.83 % Apply 1 application topically as needed (for eczema).     . clonazePAM (KLONOPIN) 0.5 MG tablet TAKE 1 TABLET BY MOUTH TWICE DAILY AS NEEDED FOR ANXIETY 60 tablet 3  . diclofenac sodium (VOLTAREN) 1 % GEL APPLY THIN LAYER EXTERNALLY TO THE AFFECTED AREA UP TO FOUR TIMES DAILY AS NEEDED 300 g 0  . dronabinol (MARINOL) 2.5 MG capsule Take 2.5 mg by mouth daily as needed (for nausea and pain).   0  . hyoscyamine (LEVSIN SL) 0.125 MG SL tablet DISSOLVE 1 TABLET(0.125 MG) UNDER THE TONGUE EVERY 4 HOURS AS NEEDED 30 tablet 3  . IRON PO Take 20 mg by mouth every other day.    . metroNIDAZOLE (METROGEL) 0.75 % gel Apply 1 application topically 2 (two) times daily. (Patient  taking differently: Apply 1 application topically 2 (two) times daily. As needed) 45 g 3  . NON FORMULARY Place 1 drop under the tongue 3 (three) times daily. Hemp oil as needed    . NUCYNTA 100 MG TABS Take 1 tablet by mouth every 6 (six) hours.  0  . Phenylephrine-DM-GG-APAP (MUCINEX SINUS-MAX) 5-10-200-325 MG CAPS Take 2 tablets by mouth 2 (two) times daily as needed (for migraine).     . promethazine (PHENERGAN) 25 MG tablet Take 1 tablet (25 mg total) by mouth every 8 (eight) hours as needed for nausea or vomiting. 30 tablet 0  . sucralfate (CARAFATE) 1 g  tablet Take 1 tablet (1 g total) by mouth 2 (two) times daily before a meal. As needed 120 tablet 0  . thyroid (ARMOUR) 60 MG tablet Take 60 mg by mouth daily before breakfast. Three days a week take extra tablet daily    . Cholecalciferol (VITAMIN D) 2000 units CAPS Take 1 capsule (2,000 Units total) by mouth daily. (Patient not taking: Reported on 04/14/2018) 30 capsule   . ciprofloxacin (CIPRO) 500 MG tablet Take 1 tablet (500 mg total) by mouth 2 (two) times daily. 6 tablet 0   No facility-administered medications prior to visit.      Per HPI unless specifically indicated in ROS section below Review of Systems Objective:    Temp 99.2 F (37.3 C) (Oral)   Ht 5' 7.75" (1.721 m)   Wt 174 lb (78.9 kg)   LMP 10/04/2014 Comment: per labs  BMI 26.65 kg/m   Wt Readings from Last 3 Encounters:  04/14/18 174 lb (78.9 kg)  01/02/18 173 lb 4 oz (78.6 kg)  09/17/17 175 lb 8 oz (79.6 kg)     Physical exam: Gen: alert, NAD, not ill appearing Pulm: speaks in complete sentences without increased work of breathing Psych: normal mood, normal thought content      Results for orders placed or performed in visit on 04/14/18  POCT Urinalysis Dipstick (Automated)  Result Value Ref Range   Color, UA yellow    Clarity, UA clear    Glucose, UA Negative Negative   Bilirubin, UA negative    Ketones, UA negative    Spec Grav, UA 1.015 1.010 - 1.025   Blood, UA negative    pH, UA 5.5 5.0 - 8.0   Protein, UA Negative Negative   Urobilinogen, UA 0.2 0.2 or 1.0 E.U./dL   Nitrite, UA negative    Leukocytes, UA Negative Negative   Assessment & Plan:   Problem List Items Addressed This Visit    Neurogenic bladder   Mixed urge and stress incontinence   Lower abdominal pain - Primary    Lower back pain radiating to R>L lower abdomen in 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 Will ask patient to come in for labs and urinalysis to evaluate for urinary tract  infection as cause. Pt agrees with plan.       Relevant Orders   CBC with Differential/Platelet   Comprehensive metabolic panel   POCT Urinalysis Dipstick (Automated) (Completed)    Other Visit Diagnoses    Acute midline low back pain without sciatica       Relevant Orders   POCT Urinalysis Dipstick (Automated) (Completed)       No orders of the defined types were placed in this encounter.  Orders Placed This Encounter  Procedures  . CBC with Differential/Platelet  . Comprehensive metabolic panel  .  POCT Urinalysis Dipstick (Automated)    Follow up plan: No follow-ups on file.  Ria Bush, MD

## 2018-04-16 DIAGNOSIS — E063 Autoimmune thyroiditis: Secondary | ICD-10-CM | POA: Diagnosis not present

## 2018-04-16 DIAGNOSIS — E038 Other specified hypothyroidism: Secondary | ICD-10-CM | POA: Diagnosis not present

## 2018-04-18 DIAGNOSIS — E063 Autoimmune thyroiditis: Secondary | ICD-10-CM | POA: Diagnosis not present

## 2018-04-18 DIAGNOSIS — E038 Other specified hypothyroidism: Secondary | ICD-10-CM | POA: Diagnosis not present

## 2018-04-23 ENCOUNTER — Encounter: Payer: Self-pay | Admitting: Family Medicine

## 2018-04-23 DIAGNOSIS — Z79891 Long term (current) use of opiate analgesic: Secondary | ICD-10-CM | POA: Diagnosis not present

## 2018-04-23 DIAGNOSIS — G894 Chronic pain syndrome: Secondary | ICD-10-CM | POA: Diagnosis not present

## 2018-04-23 DIAGNOSIS — M62831 Muscle spasm of calf: Secondary | ICD-10-CM | POA: Diagnosis not present

## 2018-04-23 DIAGNOSIS — M961 Postlaminectomy syndrome, not elsewhere classified: Secondary | ICD-10-CM | POA: Diagnosis not present

## 2018-05-12 ENCOUNTER — Encounter: Payer: Self-pay | Admitting: Family Medicine

## 2018-05-13 ENCOUNTER — Other Ambulatory Visit: Payer: Self-pay | Admitting: Family Medicine

## 2018-05-13 MED ORDER — SUCRALFATE 1 G PO TABS: 1.0000 g | ORAL_TABLET | Freq: Two times a day (BID) | ORAL | 0 refills | Status: DC

## 2018-05-13 NOTE — Telephone Encounter (Signed)
E-scribed refill.  Notified pt via MyChart.  ?

## 2018-06-18 DIAGNOSIS — M961 Postlaminectomy syndrome, not elsewhere classified: Secondary | ICD-10-CM | POA: Diagnosis not present

## 2018-06-18 DIAGNOSIS — M62831 Muscle spasm of calf: Secondary | ICD-10-CM | POA: Diagnosis not present

## 2018-06-18 DIAGNOSIS — G894 Chronic pain syndrome: Secondary | ICD-10-CM | POA: Diagnosis not present

## 2018-06-18 DIAGNOSIS — Z79891 Long term (current) use of opiate analgesic: Secondary | ICD-10-CM | POA: Diagnosis not present

## 2018-07-03 ENCOUNTER — Other Ambulatory Visit: Payer: Self-pay | Admitting: Family Medicine

## 2018-07-03 NOTE — Telephone Encounter (Signed)
Eprescribed.

## 2018-07-07 DIAGNOSIS — M5135 Other intervertebral disc degeneration, thoracolumbar region: Secondary | ICD-10-CM | POA: Diagnosis not present

## 2018-07-07 DIAGNOSIS — M961 Postlaminectomy syndrome, not elsewhere classified: Secondary | ICD-10-CM | POA: Diagnosis not present

## 2018-07-18 DIAGNOSIS — E038 Other specified hypothyroidism: Secondary | ICD-10-CM | POA: Diagnosis not present

## 2018-07-18 DIAGNOSIS — E063 Autoimmune thyroiditis: Secondary | ICD-10-CM | POA: Diagnosis not present

## 2018-07-25 DIAGNOSIS — E038 Other specified hypothyroidism: Secondary | ICD-10-CM | POA: Diagnosis not present

## 2018-07-25 DIAGNOSIS — E063 Autoimmune thyroiditis: Secondary | ICD-10-CM | POA: Diagnosis not present

## 2018-07-28 DIAGNOSIS — M62831 Muscle spasm of calf: Secondary | ICD-10-CM | POA: Diagnosis not present

## 2018-07-28 DIAGNOSIS — Z79891 Long term (current) use of opiate analgesic: Secondary | ICD-10-CM | POA: Diagnosis not present

## 2018-07-28 DIAGNOSIS — G894 Chronic pain syndrome: Secondary | ICD-10-CM | POA: Diagnosis not present

## 2018-07-28 DIAGNOSIS — M961 Postlaminectomy syndrome, not elsewhere classified: Secondary | ICD-10-CM | POA: Diagnosis not present

## 2018-08-01 ENCOUNTER — Other Ambulatory Visit: Payer: Self-pay | Admitting: Family Medicine

## 2018-08-01 NOTE — Telephone Encounter (Signed)
Name of Medication: Clonazepam Name of [facility] Date and Quantity: 07/03/18, #60 Last Office Visit and Type: 04/14/18, acute Next Office Visit and Type: none Last Controlled Substance Agreement Date:  Last UDS: 06/10/13, scanned

## 2018-08-01 NOTE — Telephone Encounter (Signed)
Eprescribed.

## 2018-08-18 ENCOUNTER — Other Ambulatory Visit: Payer: Self-pay | Admitting: Obstetrics and Gynecology

## 2018-08-18 ENCOUNTER — Other Ambulatory Visit (HOSPITAL_COMMUNITY)
Admission: RE | Admit: 2018-08-18 | Discharge: 2018-08-18 | Disposition: A | Discharge status: Home / Self Care | Payer: Medicare Other | Source: Ambulatory Visit | Attending: Obstetrics and Gynecology | Admitting: Obstetrics and Gynecology

## 2018-08-18 ENCOUNTER — Ambulatory Visit (INDEPENDENT_AMBULATORY_CARE_PROVIDER_SITE_OTHER): Payer: Medicare Other | Admitting: Obstetrics and Gynecology

## 2018-08-18 ENCOUNTER — Other Ambulatory Visit: Payer: Self-pay

## 2018-08-18 ENCOUNTER — Encounter: Payer: Self-pay | Admitting: Obstetrics and Gynecology

## 2018-08-18 VITALS — BP 110/80 | HR 98 | Ht 68.0 in | Wt 174.0 lb

## 2018-08-18 DIAGNOSIS — N6310 Unspecified lump in the right breast, unspecified quadrant: Secondary | ICD-10-CM

## 2018-08-18 DIAGNOSIS — Z124 Encounter for screening for malignant neoplasm of cervix: Secondary | ICD-10-CM | POA: Insufficient documentation

## 2018-08-18 DIAGNOSIS — Z01419 Encounter for gynecological examination (general) (routine) without abnormal findings: Secondary | ICD-10-CM | POA: Diagnosis not present

## 2018-08-18 DIAGNOSIS — Z1151 Encounter for screening for human papillomavirus (HPV): Secondary | ICD-10-CM | POA: Diagnosis not present

## 2018-08-18 DIAGNOSIS — Z1239 Encounter for other screening for malignant neoplasm of breast: Secondary | ICD-10-CM

## 2018-08-18 DIAGNOSIS — N6320 Unspecified lump in the left breast, unspecified quadrant: Secondary | ICD-10-CM

## 2018-08-18 DIAGNOSIS — Z1231 Encounter for screening mammogram for malignant neoplasm of breast: Secondary | ICD-10-CM

## 2018-08-18 NOTE — Progress Notes (Signed)
Gynecology Annual Exam  PCP: Ria Bush, MD  Chief Complaint:  Chief Complaint  Patient presents with  . Gynecologic Exam    History of Present Illness:Patient is a 53 y.o. G2P2 presents for annual exam. The patient has no complaints today.   LMP: Patient's last menstrual period was 10/04/2014. Postmenopausal, no bleeding concerns  The patient is sexually active. She denies dyspareunia.  The patient does perform self breast exams.  There is no notable family history of breast or ovarian cancer in her family.  The patient wears seatbelts: yes.   The patient has regular exercise: not asked.    The patient denies current symptoms of depression.     Review of Systems: ROS  Past Medical History:  Past Medical History:  Diagnosis Date  . Abnormal MRI scan, bone 2012   abnormal marrow signal humeral head, glenoid and scapula - diffuse replacement of fatty marrow - likely benign, no need for further investigation ([place])  . Agnosia for temperature    Right Leg  . Anemia    etiology unknown-iron infusion in past,last 9/12  . Anxiety   . Anxiety and depression   . 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 Arthritis   . Chronic pain    pain contract with Dr. Eartha Inch Pain  . DDD (degenerative disc disease), cervical   . DDD (degenerative disc disease), lumbar    s/p permanent nerve damage after back surgery  . Depression   . Depression with anxiety   . Diverticulosis   . Eczema   . Family history of adverse reaction to anesthesia    Mother - nausea  . Fibromyalgia   . Foot drop    right - numbness, tingling - twisting  . Gallstones   . GERD (gastroesophageal reflux disease)    rare zantac PRN N/V at times with this  . GERD (gastroesophageal reflux disease)   . Headache(784.0)    migraines  . Hemorrhoid   . Hemorrhoids   . History of kidney stones   . Hyperlipidemia   . Hypothyroidism 1990s   hashimoto's thyroiditis  . IBS (irritable bowel  syndrome)   . IBS (irritable bowel syndrome)   . Incontinence of urine    s/p sling procedure-unresolved  . Iron deficiency    h/o anemia, s/p iron infusion (2012)  . Liver hemangioma     x2  . Memory change    due to medications  . Migraines    and h/o optical migraine  . Migraines   . Mixed urge and stress incontinence 2008   extensive workup including urodynamics, failed miltiple anticholinergics including myrbetriq and urogesic blue s/p PTNS, normal cystoscopy 2014, seen by Dr Matilde Sprang and Dr Erlene Quan  . Neurogenic bladder 2008   after 13 spine surgeries with periph neuropathy and chronic LBP  . Neuropathy of lower extremity 2011  . Nocturia   . Notalgia paresthetica   . Osteoarthritis 12/2013  . PONV (postoperative nausea and vomiting)   . Pulmonary nodule 04/2012   7.30mm RLL nodule - resolved on imaging 12/2012  . Sleep apnea    mild-doesn't use cpap  . Urine frequency   . Vitamin D deficiency     Past Surgical History:  Past Surgical History:  Procedure Laterality Date  . ANTERIOR CERVICAL DECOMP/DISCECTOMY FUSION     C3, 4, 5 - improved after surgery  . ANTERIOR CERVICAL DECOMP/DISCECTOMY FUSION  07/2017   C6/7 Dr Pamala Hurry  . BACK SURGERY  multiple-L4-s1,hardware,fusion,removal,infection s/p 5/11 procedure  . BACK SURGERY  02/16/11   "my 7th back OR; Procedure: Exploration of fusion removal of hardware L4-S1, harvesting of right iliac crest bone graft, redo posterior lateral fusion L4-5 using iliac crest bone graft BMP and master graft replacement of bilateral L4 screws with a Telfa tech 7.5 x 40 mm screw on the right and 8.5 x 40 mm screw on the left placement of a large Hemovac drain  . [place]; 2001   x 2  . CHOLECYSTECTOMY  2001  . COLONOSCOPY  10/2016   rpt 10 yrs Gustavo Lah)  . ESOPHAGEAL MANOMETRY  06/2017   WNL (Dr Earnie Larsson)  . ESOPHAGOGASTRODUODENOSCOPY  03/2013   early esophageal stricture dilated, o/w WNL Deatra Ina)  .  ESOPHAGOGASTRODUODENOSCOPY  12/2014   WNL, s/p empiric dilation of esophagus Henrene Pastor)  . ESOPHAGOGASTRODUODENOSCOPY  10/2015   WNL, biopsies WNL as well (minimal esophagitis with reactive changes) [place])  . ESOPHAGOGASTRODUODENOSCOPY  10/2016   biopsy suggestive of ulcer ([place])  . HEMORRHOID BANDING    . LASIK Bilateral   . LUMBAR WOUND DEBRIDEMENT  03/15/2011   Procedure: LUMBAR WOUND DEBRIDEMENT;  Surgeon: Elaina Hoops, MD;  Lumbar Wound Debridement  . MANDIBLE RECONSTRUCTION  1986  . PFT  01/28/2013   WNL  . PUBOVAGINAL SLING  2008   midurethral sling  . SHOULDER CLOSED REDUCTION Left 12/18/2010   Procedure: CLOSED MANIPULATION SHOULDER;  Surgeon: Johnn Hai; labrial debridement  . SPINAL CORD STIMULATOR INSERTION N/A 08/19/2015   Procedure: LUMBAR SPINAL CORD STIMULATOR INSERTION;  Surgeon: Erline Levine, MD;  Location: [facility];  Service: Neurosurgery;  Laterality: N/A;  LUMBAR SPINAL CORD STIMULATOR INSERTION  . SPINE SURGERY    . TONSILLECTOMY  1972   as a child; "then they grew back"    Gynecologic History:  Patient's last menstrual period was 10/04/2014. Last Pap: Results were: 06/25/2016 NIL and HR HPV negative  Last mammogram: 06/19/2016 Results were: [facility])  Obstetric History: G2P2  Family History:  Family History  Problem Relation Age of Onset  . Lung cancer Father 53        (smoker)  . Diabetes Brother   . Thyroid disease Mother        ?empty sella  . Irritable bowel syndrome Mother   . Dysphagia Mother   . CAD Maternal Grandfather        MI  . Heart disease Maternal Grandfather   . Heart attack Maternal Grandfather   . Lung cancer Paternal Grandfather         (smoker)  . Colon cancer Paternal Grandmother   . Thyroid disease Cousin   . Cancer Other        unsure (ovarian/uterine)  . Breast cancer Maternal Aunt   . Alzheimer's disease Maternal Grandmother   . Diabetes Daughter     Social History:  Social History   Socioeconomic  History  . Marital status: Married    Spouse name: Not on file  . Number of children: 2  . Years of education: Not on file  . Highest education level: Not on file  Occupational History  . Occupation: disabled  Social Needs  . Financial resource strain: Not on file  . Food insecurity    Worry: Not on file    Inability: Not on file  . Transportation needs    Medical: Not on file    Non-medical: Not on file  Tobacco Use  . Smoking status: Never Smoker  .  Smokeless tobacco: Never Used  Substance and Sexual Activity  . Alcohol use: Yes    Alcohol/week: 0.0 standard drinks    Comment: rarely drinks wine  . Drug use: No  . Sexual activity: Not Currently  Lifestyle  . Physical activity    Days per week: Not on file    Minutes per session: Not on file  . Stress: Not on file  Relationships  . Social Herbalist on phone: Not on file    Gets together: Not on file    Attends religious service: Not on file    Active member of club or organization: Not on file    Attends meetings of clubs or organizations: Not on file    Relationship status: Not on file  . Intimate partner violence    Fear of current or ex partner: Not on file    Emotionally abused: Not on file    Physically abused: Not on file    Forced sexual activity: Not on file  Other Topics Concern  . Not on file  Social History Narrative   Lives with husband and 2 daughters (both in college [employer]))   Daughter of Lurena Joiner   Occupation: was Public affairs consultant   Disability since 2011 - arachnoiditis   Edu: associate's degree   Activity: severe limitations 2/2 arachnoiditis and chronic pain, walks to end of driveway and back   Diet: good water, fruits/vegetables daily    Allergies:  Allergies  Allergen Reactions  . Augmentin [Amoxicillin-Pot Clavulanate] Swelling    Lip swelling and irritation.   . Fluconazole Swelling    Lips - swelling, bleeding, blisters, cracking, peeling  .  Erythromycin Nausea And Vomiting  . Influenza Vaccines Hives and Swelling    Redness, heat, and extreme swelling at site  . Levothyroxine Hives  . Thimerosal Swelling    Local reaction-redness,swelling; veins popping  . Tomato Other (See Comments)    Severe acid reflux with all acid foods  . Adhesive [Tape] Rash    blisters  . Azithromycin Rash  . Gabapentin Other (See Comments)    Short term memory loss  . Imitrex [Sumatriptan] Other (See Comments)    Rebound migraine   . Lyrica [Pregabalin] Other (See Comments)    Short term memory loss  . Macrodantin [Nitrofurantoin] Rash  . Macrolides And Ketolides Rash  . Nickel Swelling and Rash  . Sulfa Antibiotics Rash  . Sulfasalazine Rash  . Troleandomycin Rash    Medications: Prior to Admission medications   Medication Sig Start Date End Date Taking? Authorizing Provider  Ascorbic Acid (VITAMIN C PO) Take 1 tablet by mouth as needed (for immune system support).    Yes [provider]  baclofen (LIORESAL) 10 MG tablet Take 15 mg by mouth 3 (three) times daily. Takes 1.5 tablets 3 times daily   Yes [provider]  calcium carbonate (TUMS SMOOTHIES) 750 MG chewable tablet Chew 2-3 tablets by mouth 2 (two) times daily.   Yes [provider]  camphor-menthol Timoteo Ace) lotion Apply 1 application topically at bedtime as needed for itching.    Yes [provider]  Cholecalciferol (VITAMIN D) 2000 units CAPS Take 1 capsule (2,000 Units total) by mouth daily. 10/10/15  Yes Ria Bush, MD  clobetasol ointment (TEMOVATE) 8.56 % Apply 1 application topically as needed (for eczema).    Yes [provider]  clonazePAM (KLONOPIN) 0.5 MG tablet TAKE 1 TABLET BY MOUTH TWICE DAILY AS NEEDED FOR  ANXIETY 08/01/18  Yes Ria Bush, MD  diclofenac sodium (VOLTAREN) 1 % GEL APPLY THIN LAYER EXTERNALLY TO THE AFFECTED AREA UP TO FOUR TIMES DAILY AS NEEDED 12/31/17  Yes Mcarthur Rossetti, MD   dronabinol (MARINOL) 2.5 MG capsule Take 2.5 mg by mouth daily as needed (for nausea and pain).  02/03/14  Yes [provider]  hyoscyamine (LEVSIN SL) 0.125 MG SL tablet DISSOLVE 1 TABLET(0.125 MG) UNDER THE TONGUE EVERY 4 HOURS AS NEEDED 01/15/18  Yes Ria Bush, MD  IRON PO Take 20 mg by mouth every other day.   Yes [provider]  metroNIDAZOLE (METROGEL) 0.75 % gel Apply 1 application topically 2 (two) times daily. Patient taking differently: Apply 1 application topically 2 (two) times daily. As needed 06/08/16  Yes Ria Bush, MD  NON FORMULARY Place 1 drop under the tongue 3 (three) times daily. Hemp oil as needed   Yes [provider]  NUCYNTA 100 MG TABS Take 1 tablet by mouth every 6 (six) hours. 05/23/17  Yes [provider]  promethazine (PHENERGAN) 25 MG tablet Take 1 tablet (25 mg total) by mouth every 8 (eight) hours as needed for nausea or vomiting. 12/03/16  Yes Ria Bush, MD  sucralfate (CARAFATE) 1 g tablet Take 1 tablet (1 g total) by mouth 2 (two) times daily before a meal. As needed 05/13/18  Yes Ria Bush, MD  thyroid (ARMOUR) 60 MG tablet Take 60 mg by mouth daily before breakfast. Three days a week take extra tablet daily 10/10/15  Yes Ria Bush, MD  Phenylephrine-DM-GG-APAP [facility] SINUS-MAX) 5-10-200-325 MG CAPS Take 2 tablets by mouth 2 (two) times daily as needed (for migraine).     [provider]    Physical Exam Vitals: Blood pressure 110/80, pulse 98, height 5\' 8"  (1.727 m), weight 174 lb (78.9 kg), last menstrual period 10/04/2014.  General: NAD HEENT: normocephalic, anicteric Thyroid: no enlargement, no palpable nodules Pulmonary: No increased work of breathing, CTAB Cardiovascular: RRR, distal pulses 2+ Breast: Breast symmetrical, no tenderness, no palpable nodules or masses, no skin or nipple retraction present, no nipple discharge.  No axillary or supraclavicular lymphadenopathy.  Abdomen: NABS, soft, non-tender, non-distended.  Umbilicus without lesions.  No hepatomegaly, splenomegaly or masses palpable. No evidence of hernia  Genitourinary:  External: Normal external female genitalia.  Normal urethral meatus, normal Bartholin's and Skene's glands.    Vagina: Normal vaginal mucosa, no evidence of prolapse.    Cervix: Grossly normal in appearance, no bleeding  Uterus: Non-enlarged, mobile, normal contour.  No CMT  Adnexa: ovaries non-enlarged, no adnexal masses  Rectal: deferred  Lymphatic: no evidence of inguinal lymphadenopathy Extremities: no edema, erythema, or tenderness Neurologic: Grossly intact Psychiatric: mood appropriate, affect full  Female chaperone present for pelvic and breast  portions of the physical exam     Assessment: 53 y.o. G2P2 routine annual exam  Plan: Problem List Items Addressed This Visit    None    Visit Diagnoses    Encounter for gynecological examination without abnormal finding    -  Primary   Screening for malignant neoplasm of cervix       Relevant Orders   Cytology - PAP   Breast screening          1) Mammogram - recommend yearly screening mammogram.  Mammogram Was ordered today  2) STI screening  was notoffered and therefore not obtained  3) ASCCP guidelines and rational discussed.  Patient opts for yearly screening interval  4) Osteoporosis  -  per USPTF routine screening DEXA at age 23  5) Routine healthcare maintenance including cholesterol, diabetes screening discussed managed by PCP  8) No follow-ups on file.    Malachy Mood, MD Mosetta Pigeon, [facility] 08/18/2018, 11:29 AM

## 2018-08-18 NOTE — Patient Instructions (Signed)
[facility] [address] [place] [address]  [facility]  [address] [place] [address]  Phone: [phone]  

## 2018-08-21 LAB — CYTOLOGY - PAP
Adequacy: ABSENT
Diagnosis: NEGATIVE
HPV: NOT DETECTED

## 2018-08-26 ENCOUNTER — Other Ambulatory Visit: Payer: Self-pay

## 2018-08-26 ENCOUNTER — Ambulatory Visit
Admission: RE | Admit: 2018-08-26 | Discharge: 2018-08-26 | Disposition: A | Discharge status: Home / Self Care | Payer: Medicare Other | Source: Ambulatory Visit | Attending: Obstetrics and Gynecology | Admitting: Obstetrics and Gynecology

## 2018-08-26 DIAGNOSIS — N6310 Unspecified lump in the right breast, unspecified quadrant: Secondary | ICD-10-CM

## 2018-08-26 DIAGNOSIS — N6314 Unspecified lump in the right breast, lower inner quadrant: Secondary | ICD-10-CM | POA: Diagnosis not present

## 2018-08-26 DIAGNOSIS — Z1231 Encounter for screening mammogram for malignant neoplasm of breast: Secondary | ICD-10-CM

## 2018-08-26 DIAGNOSIS — N6320 Unspecified lump in the left breast, unspecified quadrant: Secondary | ICD-10-CM

## 2018-08-26 DIAGNOSIS — N6321 Unspecified lump in the left breast, upper outer quadrant: Secondary | ICD-10-CM | POA: Insufficient documentation

## 2018-08-26 DIAGNOSIS — N6313 Unspecified lump in the right breast, lower outer quadrant: Secondary | ICD-10-CM | POA: Diagnosis not present

## 2018-08-26 DIAGNOSIS — R928 Other abnormal and inconclusive findings on diagnostic imaging of breast: Secondary | ICD-10-CM | POA: Diagnosis not present

## 2018-08-26 DIAGNOSIS — N6311 Unspecified lump in the right breast, upper outer quadrant: Secondary | ICD-10-CM | POA: Insufficient documentation

## 2018-08-26 DIAGNOSIS — N6323 Unspecified lump in the left breast, lower outer quadrant: Secondary | ICD-10-CM | POA: Diagnosis not present

## 2018-08-31 ENCOUNTER — Other Ambulatory Visit: Payer: Self-pay | Admitting: Family Medicine

## 2018-09-01 ENCOUNTER — Encounter: Payer: Self-pay | Admitting: Family Medicine

## 2018-09-01 DIAGNOSIS — D1803 Hemangioma of intra-abdominal structures: Secondary | ICD-10-CM | POA: Insufficient documentation

## 2018-09-01 NOTE — Telephone Encounter (Signed)
LOV 04/14/2018 for acute visit, no future appointment on file. Last filled 08/01/2018 for #60 with 0 refills.

## 2018-09-01 NOTE — Telephone Encounter (Signed)
Forwarding to PCP who will be back in office tomorrow.

## 2018-09-02 DIAGNOSIS — U071 COVID-19: Secondary | ICD-10-CM

## 2018-09-02 HISTORY — DX: COVID-19: U07.1

## 2018-09-02 NOTE — Telephone Encounter (Signed)
Eprescribed.

## 2018-09-21 ENCOUNTER — Other Ambulatory Visit: Payer: Self-pay

## 2018-09-21 ENCOUNTER — Emergency Department
Admission: EM | Admit: 2018-09-21 | Discharge: 2018-09-21 | Disposition: A | Discharge status: Home / Self Care | Payer: Medicare Other | Attending: Emergency Medicine | Admitting: Emergency Medicine

## 2018-09-21 ENCOUNTER — Encounter: Payer: Self-pay | Admitting: Emergency Medicine

## 2018-09-21 DIAGNOSIS — E039 Hypothyroidism, unspecified: Secondary | ICD-10-CM | POA: Insufficient documentation

## 2018-09-21 DIAGNOSIS — H1033 Unspecified acute conjunctivitis, bilateral: Secondary | ICD-10-CM

## 2018-09-21 DIAGNOSIS — B349 Viral infection, unspecified: Secondary | ICD-10-CM | POA: Diagnosis not present

## 2018-09-21 DIAGNOSIS — Z79899 Other long term (current) drug therapy: Secondary | ICD-10-CM | POA: Insufficient documentation

## 2018-09-21 DIAGNOSIS — R509 Fever, unspecified: Secondary | ICD-10-CM | POA: Diagnosis present

## 2018-09-21 DIAGNOSIS — U071 COVID-19: Secondary | ICD-10-CM | POA: Insufficient documentation

## 2018-09-21 DIAGNOSIS — B9689 Other specified bacterial agents as the cause of diseases classified elsewhere: Secondary | ICD-10-CM | POA: Diagnosis not present

## 2018-09-21 MED ORDER — FLUORESCEIN SODIUM 1 MG OP STRP
1.0000 | ORAL_STRIP | Freq: Once | OPHTHALMIC | Status: AC
  Administered 2018-09-21: 1 via OPHTHALMIC
  Filled 2018-09-21: qty 1

## 2018-09-21 MED ORDER — TETRACAINE HCL 0.5 % OP SOLN
2.0000 [drp] | Freq: Once | OPHTHALMIC | Status: AC
  Administered 2018-09-21: 2 [drp] via OPHTHALMIC
  Filled 2018-09-21: qty 4

## 2018-09-21 MED ORDER — CIPROFLOXACIN HCL 0.3 % OP SOLN: 1.0000 [drp] | OPHTHALMIC | 0 refills | Status: AC

## 2018-09-21 NOTE — ED Provider Notes (Signed)
[facility] Emergency Department Provider Note  ____________________________________________  Time seen: Approximately 1:42 PM  I have reviewed the triage vital signs and the nursing notes.   HISTORY  Chief Complaint Eye Problem and Generalized Body Aches    HPI Karen Aguilar is a 53 y.o. female that presents to the emergency department for evaluation of red eyes with watery drainage, low-grade fever, sneezing, body aches for 2 days.  Patient states that left eye became red, irritated, swollen yesterday.  Right eye was red this morning.  She is noticing clear drainage from eyes bilaterally.  Left eye is painful, right eye has no pain.  No foreign body sensation.  She has some mild photophobia.  She has some mild blurry vision due to the watering.  No trauma.  Body aches all over. No contacts with COVID-19.  She does not wear glasses or contacts.  She has been taking allergy medicines, without relief.  She has been using a heating pad to left eye.  No cough, shortness of breath, nausea or vomiting.  Past Medical History:  Diagnosis Date  . Abnormal MRI scan, bone 2012   abnormal marrow signal humeral head, glenoid and scapula - diffuse replacement of fatty marrow - likely benign, no need for further investigation ([place])  . Agnosia for temperature    Right Leg  . Anemia    etiology unknown-iron infusion in past,last 9/12  . Anxiety   . Anxiety and depression   . 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 Arthritis   . Chronic pain    pain contract with Dr. Eartha Inch Pain  . DDD (degenerative disc disease), cervical   . DDD (degenerative disc disease), lumbar    s/p permanent nerve damage after back surgery  . Depression   . Depression with anxiety   . Diverticulosis   . Eczema   . Family history of adverse reaction to anesthesia    Mother - nausea  . Fibromyalgia   . Foot drop    right - numbness, tingling - twisting  . Gallstones   . GERD  (gastroesophageal reflux disease)    rare zantac PRN N/V at times with this  . GERD (gastroesophageal reflux disease)   . Headache(784.0)    migraines  . Hemorrhoid   . Hemorrhoids   . History of kidney stones   . Hyperlipidemia   . Hypothyroidism 1990s   hashimoto's thyroiditis  . IBS (irritable bowel syndrome)   . IBS (irritable bowel syndrome)   . Incontinence of urine    s/p sling procedure-unresolved  . Iron deficiency    h/o anemia, s/p iron infusion (2012)  . Liver hemangioma     x2  . Memory change    due to medications  . Migraines    and h/o optical migraine  . Migraines   . Mixed urge and stress incontinence 2008   extensive workup including urodynamics, failed miltiple anticholinergics including myrbetriq and urogesic blue s/p PTNS, normal cystoscopy 2014, seen by Dr Matilde Sprang and Dr Erlene Quan  . Neurogenic bladder 2008   after 13 spine surgeries with periph neuropathy and chronic LBP  . Neuropathy of lower extremity 2011  . Nocturia   . Notalgia paresthetica   . Osteoarthritis 12/2013  . PONV (postoperative nausea and vomiting)   . Pulmonary nodule 04/2012   7.1mm RLL nodule - resolved on imaging 12/2012  . Sleep apnea    mild-doesn't use cpap  . Urine frequency   .  Vitamin D deficiency     Patient Active Problem List   Diagnosis Date Noted  . Hepatic hemangioma 09/01/2018  . Lower abdominal pain 04/14/2018  . Mass of chest wall, left 05/09/2017  . Epigastric abdominal pain 02/28/2017  . Non-intractable vomiting with nausea 02/27/2017  . Right leg pain 12/06/2016  . DDD (degenerative disc disease), cervical 06/09/2016  . GAD (generalized anxiety disorder) 02/18/2016  . Right shoulder pain 02/18/2016  . Pelvic pressure in female 01/23/2016  . Post-menopausal 10/10/2015  . Urticaria 09/08/2015  . Neurogenic bladder   . Mixed urge and stress incontinence   . Chronic headache 09/22/2014  . Advanced care planning/counseling discussion 07/15/2014  .  Health maintenance examination 07/15/2014  . Irritable bowel syndrome 02/23/2014  . Esophageal dysphagia 06/05/2013  . Diarrhea 05/12/2013  . Chronic pelvic pain in female 05/05/2013  . Proctalgia fugax 05/05/2013  . Gastroparesis 05/04/2013  . Solitary pulmonary nodule 03/18/2013  . Shortness of breath 03/18/2013  . Unspecified constipation 02/10/2013  . Internal hemorrhoids 02/10/2013  . Hypothyroidism   . Fibromyalgia   . GERD (gastroesophageal reflux disease)   . Chronic pain syndrome   . Sleep apnea   . MDD (major depressive disorder), recurrent episode, moderate ([place])   . Arachnoiditis   . Hyperlipidemia   . Complicated migraine   . Vitamin D deficiency   . Iron deficiency   . DDD (degenerative disc disease), lumbar     Past Surgical History:  Procedure Laterality Date  . ANTERIOR CERVICAL DECOMP/DISCECTOMY FUSION     C3, 4, 5 - improved after surgery  . ANTERIOR CERVICAL DECOMP/DISCECTOMY FUSION  07/2017   C6/7 Dr Pamala Hurry  . BACK SURGERY     multiple-L4-s1,hardware,fusion,removal,infection s/p 5/11 procedure  . BACK SURGERY  02/16/11   "my 7th back OR; Procedure: Exploration of fusion removal of hardware L4-S1, harvesting of right iliac crest bone graft, redo posterior lateral fusion L4-5 using iliac crest bone graft BMP and master graft replacement of bilateral L4 screws with a Telfa tech 7.5 x 40 mm screw on the right and 8.5 x 40 mm screw on the left placement of a large Hemovac drain  . [place]; 2001   x 2  . CHOLECYSTECTOMY  2001  . COLONOSCOPY  10/2016   rpt 10 yrs Gustavo Lah)  . ESOPHAGEAL MANOMETRY  06/2017   WNL (Dr Earnie Larsson)  . ESOPHAGOGASTRODUODENOSCOPY  03/2013   early esophageal stricture dilated, o/w WNL Deatra Ina)  . ESOPHAGOGASTRODUODENOSCOPY  12/2014   WNL, s/p empiric dilation of esophagus Henrene Pastor)  . ESOPHAGOGASTRODUODENOSCOPY  10/2015   WNL, biopsies WNL as well (minimal esophagitis with reactive changes) Benson [place])  .  ESOPHAGOGASTRODUODENOSCOPY  10/2016   biopsy suggestive of ulcer ([place])  . HEMORRHOID BANDING    . LASIK Bilateral   . LUMBAR WOUND DEBRIDEMENT  03/15/2011   Procedure: LUMBAR WOUND DEBRIDEMENT;  Surgeon: Elaina Hoops, MD;  Lumbar Wound Debridement  . MANDIBLE RECONSTRUCTION  1986  . PFT  01/28/2013   WNL  . PUBOVAGINAL SLING  2008   midurethral sling  . SHOULDER CLOSED REDUCTION Left 12/18/2010   Procedure: CLOSED MANIPULATION SHOULDER;  Surgeon: Johnn Hai; labrial debridement  . SPINAL CORD STIMULATOR INSERTION N/A 08/19/2015   Procedure: LUMBAR SPINAL CORD STIMULATOR INSERTION;  Surgeon: Erline Levine, MD;  Location: [place] [facility];  Service: Neurosurgery;  Laterality: N/A;  LUMBAR SPINAL CORD STIMULATOR INSERTION  . SPINE SURGERY    . TONSILLECTOMY  1972   as a child; "then  they grew back"    Prior to Admission medications   Medication Sig Start Date End Date Taking? Authorizing Provider  Ascorbic Acid (VITAMIN C PO) Take 1 tablet by mouth as needed (for immune system support).     [provider]  baclofen (LIORESAL) 10 MG tablet Take 15 mg by mouth 3 (three) times daily. Takes 1.5 tablets 3 times daily    [provider]  calcium carbonate (TUMS SMOOTHIES) 750 MG chewable tablet Chew 2-3 tablets by mouth 2 (two) times daily.    [provider]  camphor-menthol Timoteo Ace) lotion Apply 1 application topically at bedtime as needed for itching.     [provider]  Cholecalciferol (VITAMIN D) 2000 units CAPS Take 1 capsule (2,000 Units total) by mouth daily. 10/10/15   Ria Bush, MD  ciprofloxacin (CILOXAN) 0.3 % ophthalmic solution Place 1 drop into both eyes every 2 (two) hours for 5 days. Administer 1 drop, every 2 hours, while awake, for 2 days. Then 1 drop, every 4 hours, while awake, for the next 5 days. 09/21/18 09/26/18  Laban Emperor, PA-C  clobetasol ointment (TEMOVATE) [id number]-[id number] % Apply 1 application topically as needed (for eczema).      [provider]  clonazePAM (KLONOPIN) 0.5 MG tablet TAKE 1 TABLET BY MOUTH TWICE DAILY AS NEEDED FOR ANXIETY 09/02/18   Ria Bush, MD  diclofenac sodium (VOLTAREN) 1 % GEL APPLY THIN LAYER EXTERNALLY TO THE AFFECTED AREA UP TO FOUR TIMES DAILY AS NEEDED 12/31/17   Mcarthur Rossetti, MD  dronabinol (MARINOL) 2.5 MG capsule Take 2.5 mg by mouth daily as needed (for nausea and pain).  02/03/14   [provider]  hyoscyamine (LEVSIN SL) 0.125 MG SL tablet DISSOLVE 1 TABLET(0.125 MG) UNDER THE TONGUE EVERY 4 HOURS AS NEEDED 01/15/18   Ria Bush, MD  IRON PO Take 20 mg by mouth every other day.    [provider]  metroNIDAZOLE (METROGEL) 0.75 % gel Apply 1 application topically 2 (two) times daily. Patient taking differently: Apply 1 application topically 2 (two) times daily. As needed 06/08/16   Ria Bush, MD  NON FORMULARY Place 1 drop under the tongue 3 (three) times daily. Hemp oil as needed    [provider]  NUCYNTA 100 MG TABS Take 1 tablet by mouth every 6 (six) hours. 05/23/17   [provider]  Phenylephrine-DM-GG-APAP (MUCINEX SINUS-MAX) 5-10-200-325 MG CAPS Take 2 tablets by mouth 2 (two) times daily as needed (for migraine).     [provider]  promethazine (PHENERGAN) 25 MG tablet Take 1 tablet (25 mg total) by mouth every 8 (eight) hours as needed for nausea or vomiting. 12/03/16   Ria Bush, MD  sucralfate (CARAFATE) 1 g tablet Take 1 tablet (1 g total) by mouth 2 (two) times daily before a meal. As needed 05/13/18   Ria Bush, MD  thyroid (ARMOUR) 60 MG tablet Take 60 mg by mouth daily before breakfast. Three days a week take extra tablet daily 10/10/15   Ria Bush, MD    Allergies Augmentin [amoxicillin-pot clavulanate], Fluconazole, Erythromycin, Influenza vaccines, Levothyroxine, Thimerosal, Tomato, Adhesive [tape], Azithromycin, Gabapentin, Imitrex [sumatriptan], Lyrica [pregabalin],  Macrodantin [nitrofurantoin], Macrolides and ketolides, Nickel, Sulfa antibiotics, Sulfasalazine, and Troleandomycin  Family History  Problem Relation Age of Onset  . Lung cancer Father 61        (smoker)  . Diabetes Brother   . Thyroid disease Mother        ?empty sella  . Irritable  bowel syndrome Mother   . Dysphagia Mother   . CAD Maternal Grandfather        MI  . Heart disease Maternal Grandfather   . Heart attack Maternal Grandfather   . Lung cancer Paternal Grandfather         (smoker)  . Colon cancer Paternal Grandmother   . Thyroid disease Cousin   . Cancer Other        unsure (ovarian/uterine)  . Breast cancer Maternal Aunt   . Alzheimer's disease Maternal Grandmother   . Diabetes Daughter   . Breast cancer Maternal Aunt     Social History Social History   Tobacco Use  . Smoking status: Never Smoker  . Smokeless tobacco: Never Used  Substance Use Topics  . Alcohol use: Yes    Alcohol/week: 0.0 standard drinks    Comment: rarely drinks wine  . Drug use: No     Review of Systems  Constitutional: Positive for low grade fever. Eyes: Positive for red, watery eyes. ENT: Positive for congestion and rhinorrhea. Respiratory: Negative for cough. No SOB. Gastrointestinal: No abdominal pain.  No nausea, no vomiting.  No diarrhea.  No constipation. Musculoskeletal: Negative for musculoskeletal pain. Skin: Negative for rash, abrasions, lacerations, ecchymosis. Neurological: Negative for headaches.   ____________________________________________   PHYSICAL EXAM:  VITAL SIGNS: ED Triage Vitals  Enc Vitals Group     BP 09/21/18 1215 (!) 144/84     Pulse Rate 09/21/18 1215 (!) 105     Resp 09/21/18 1215 18     Temp 09/21/18 1215 99.1 F (37.3 C)     Temp Source 09/21/18 1215 Oral     SpO2 09/21/18 1215 97 %     Weight 09/21/18 1216 170 lb (77.1 kg)     Height 09/21/18 1216 5\' 7"  (1.702 m)     Head Circumference --      Peak Flow --      Pain Score 09/21/18  1215 5     Pain Loc --      Pain Edu? --      Excl. in [place]? --      Constitutional: Alert and oriented. Well appearing and in no acute distress. Eyes: Conjunctivae are injected bilaterally. PERRL. EOMI. purulent drainage present bilaterally.  No defect on fluorescein stain. Head: Atraumatic. ENT: No frontal and maxillary sinus tenderness.      Ears: Tympanic membranes pearly gray with good landmarks. No discharge.      Nose: Mild congestion/rhinnorhea.      Mouth/Throat: Mucous membranes are moist. Oropharynx non-erythematous.  Neck: No stridor.   Hematological/Lymphatic/Immunilogical: No cervical lymphadenopathy. Cardiovascular: Normal rate, regular rhythm.  Good peripheral circulation. Respiratory: Normal respiratory effort without tachypnea or retractions. Lungs CTAB. Good air entry to the bases with no decreased or absent breath sounds. Gastrointestinal: Bowel sounds 4 quadrants. Soft and nontender to palpation. No guarding or rigidity. No palpable masses. No distention. Musculoskeletal: Full range of motion to all extremities. No gross deformities appreciated. Neurologic:  Normal speech and language. No gross focal neurologic deficits are appreciated.  Skin:  Skin is warm, dry and intact. No rash noted. Psychiatric: Mood and affect are normal. Speech and behavior are normal. Patient exhibits appropriate insight and judgement.   ____________________________________________   LABS (all labs ordered are listed, but only abnormal results are displayed)  Labs Reviewed  NOVEL CORONAVIRUS, NAA (HOSP ORDER, SEND-OUT TO REF LAB; TAT 18-24 HRS)   ____________________________________________  EKG   ____________________________________________  RADIOLOGY   No  results found.  ____________________________________________    PROCEDURES  Procedure(s) performed:    Procedures    Medications  fluorescein ophthalmic strip 1 strip (1 strip Left Eye Given 09/21/18 1350)   tetracaine (PONTOCAINE) 0.5 % ophthalmic solution 2 drop (2 drops Left Eye Given by Other 09/21/18 1350)     ____________________________________________   INITIAL IMPRESSION / ASSESSMENT AND PLAN / ED COURSE  Pertinent labs & imaging results that were available during my care of the patient were reviewed by me and considered in my medical decision making (see chart for details).  Review of the [place] CSRS was performed in accordance of the [place] prior to dispensing any controlled drugs.     Patient's diagnosis is consistent with conjunctivitis and viral illness. Vital signs and exam are reassuring.  Exam is consistent with conjunctivitis.  COVID test is pending.  Patient will be discharged home with prescriptions for ciprofloxacin eyedrops. Patient is to follow up with primary care and opthalmology as needed or otherwise directed.  Referral was given to Dr. Edison Pace.  Patient is given ED precautions to return to the ED for any worsening or new symptoms.   Karen Aguilar was evaluated in Emergency Department on 09/21/2018 for the symptoms described in the history of present illness. She was evaluated in the context of the global COVID-19 pandemic, which necessitated consideration that the patient might be at risk for infection with the SARS-CoV-2 virus that causes COVID-19. Institutional protocols and algorithms that pertain to the evaluation of patients at risk for COVID-19 are in a state of rapid change based on information released by regulatory bodies including the CDC and federal and state organizations. These policies and algorithms were followed during the patient's care in the ED.  ____________________________________________  FINAL CLINICAL IMPRESSION(S) / ED DIAGNOSES  Final diagnoses:  Acute bacterial conjunctivitis of both eyes  Viral illness      NEW MEDICATIONS STARTED DURING THIS VISIT:  ED Discharge Orders         Ordered    ciprofloxacin (CILOXAN) 0.3 % ophthalmic solution   Every 2 hours     09/21/18 1427              This chart was dictated using voice recognition software/Dragon. Despite best efforts to proofread, errors can occur which can change the meaning. Any change was purely unintentional.    Laban Emperor, PA-C 09/21/18 1731    Lavonia Drafts, MD 09/23/18 [phone]

## 2018-09-21 NOTE — ED Notes (Signed)
See triage note  Presents with bilateral eye swelling  And states both eyes are watery  States low grade fever at home  States 99.2 o    Pressure behind eyes

## 2018-09-21 NOTE — ED Triage Notes (Signed)
Pt arrived via POV with reports red eye and swollen left eye since yesterday, pt states when she woke up this morning the right eye was red as well.  Pt c/o body aches and states she had a temp of 99.4 at home.

## 2018-09-21 NOTE — Discharge Instructions (Addendum)
I do not see thimerosal on the preservative ingredient list for ciprofloxacin eyedrops.  Please confirm this with the pharmacist when you pick up your prescription.  Please follow-up with ophthalmology in 2 days if symptoms have not resolved.  Your COVID results will be available in about 2 days.  Please continue your allergy medicines.

## 2018-09-23 ENCOUNTER — Telehealth: Payer: Self-pay | Admitting: Emergency Medicine

## 2018-09-23 LAB — NOVEL CORONAVIRUS, NAA (HOSP ORDER, SEND-OUT TO REF LAB; TAT 18-24 HRS): SARS-CoV-2, NAA: DETECTED — AB

## 2018-09-23 NOTE — Telephone Encounter (Signed)
Called and patient is aware of covid positive.  She says her eyes are still bothering her and she has appt with [facility].  She will call them to tell them about covid and take instructions from them.

## 2018-09-30 ENCOUNTER — Other Ambulatory Visit: Payer: Self-pay | Admitting: Family Medicine

## 2018-10-01 NOTE — Telephone Encounter (Signed)
ERx 

## 2018-10-13 DIAGNOSIS — M961 Postlaminectomy syndrome, not elsewhere classified: Secondary | ICD-10-CM | POA: Diagnosis not present

## 2018-10-13 DIAGNOSIS — G894 Chronic pain syndrome: Secondary | ICD-10-CM | POA: Diagnosis not present

## 2018-10-13 DIAGNOSIS — Z79891 Long term (current) use of opiate analgesic: Secondary | ICD-10-CM | POA: Diagnosis not present

## 2018-10-13 DIAGNOSIS — M62831 Muscle spasm of calf: Secondary | ICD-10-CM | POA: Diagnosis not present

## 2018-10-24 DIAGNOSIS — H2511 Age-related nuclear cataract, right eye: Secondary | ICD-10-CM | POA: Diagnosis not present

## 2018-10-24 DIAGNOSIS — H524 Presbyopia: Secondary | ICD-10-CM | POA: Diagnosis not present

## 2018-10-29 ENCOUNTER — Other Ambulatory Visit: Payer: Self-pay

## 2018-10-29 MED ORDER — CLONAZEPAM 0.5 MG PO TABS: 0.5000 mg | ORAL_TABLET | Freq: Two times a day (BID) | ORAL | 0 refills | Status: DC | PRN

## 2018-10-29 NOTE — Telephone Encounter (Signed)
ERx 

## 2018-10-29 NOTE — Telephone Encounter (Signed)
Name of Medication: Clonazepam Name of [facility]acy: [facility] Date and Quantity: 10/01/18, #60 Last Office Visit and Type: 04/14/18, acute abd pain Next Office Visit and Type: none Last Controlled Substance Agreement Date: 06/10/13 Last UDS: scanned 06/10/13

## 2018-11-26 ENCOUNTER — Other Ambulatory Visit: Payer: Self-pay

## 2018-11-26 NOTE — Telephone Encounter (Signed)
Name of Medication: Clonazepam Name of [facility]: [facility] Date and Quantity: 10/29/18, #60 Last Office Visit and Type: 04/14/18, acute low abd pain Next Office Visit and Type: none Last Controlled Substance Agreement Date: 06/10/13 Last UDS: 06/10/13, scanned

## 2018-11-29 MED ORDER — CLONAZEPAM 0.5 MG PO TABS: 0.5000 mg | ORAL_TABLET | Freq: Two times a day (BID) | ORAL | 0 refills | Status: DC | PRN

## 2018-11-29 NOTE — Telephone Encounter (Signed)
ERx 

## 2018-12-09 DIAGNOSIS — M62831 Muscle spasm of calf: Secondary | ICD-10-CM | POA: Diagnosis not present

## 2018-12-09 DIAGNOSIS — Z79891 Long term (current) use of opiate analgesic: Secondary | ICD-10-CM | POA: Diagnosis not present

## 2018-12-09 DIAGNOSIS — G894 Chronic pain syndrome: Secondary | ICD-10-CM | POA: Diagnosis not present

## 2018-12-09 DIAGNOSIS — M961 Postlaminectomy syndrome, not elsewhere classified: Secondary | ICD-10-CM | POA: Diagnosis not present

## 2018-12-30 ENCOUNTER — Encounter: Payer: Self-pay | Admitting: Family Medicine

## 2018-12-31 MED ORDER — CLONAZEPAM 0.5 MG PO TABS: 0.5000 mg | ORAL_TABLET | Freq: Two times a day (BID) | ORAL | 0 refills | Status: DC | PRN

## 2018-12-31 NOTE — Telephone Encounter (Signed)
Spoke with pt scheduling MyChart visit tomorrow at 10:00.

## 2018-12-31 NOTE — Telephone Encounter (Signed)
I don't see where we've received prior klonopin Rx request. Eprescribed today.  Yes would offer virtual visit for diarrhea.

## 2018-12-31 NOTE — Telephone Encounter (Signed)
Name of Medication: Clonazepam Name of [facility]: [facility] Date and Quantity: 11/29/18, #60 Last Office Visit and Type: 04/14/18, acute abd pain Next Office Visit and Type: none Last Controlled Substance Agreement Date: 06/10/13 Last UDS: 06/10/13, scanned  Also, does pt need a virtual visit?  Looks like you have seen her for the diarrhea in the past.

## 2019-01-01 ENCOUNTER — Other Ambulatory Visit: Payer: Self-pay

## 2019-01-01 ENCOUNTER — Encounter: Payer: Self-pay | Admitting: Family Medicine

## 2019-01-01 ENCOUNTER — Telehealth (INDEPENDENT_AMBULATORY_CARE_PROVIDER_SITE_OTHER): Payer: Medicare Other | Admitting: Family Medicine

## 2019-01-01 VITALS — BP 119/87 | HR 89 | Temp 96.5°F | Ht 67.0 in | Wt 176.0 lb

## 2019-01-01 DIAGNOSIS — K582 Mixed irritable bowel syndrome: Secondary | ICD-10-CM

## 2019-01-01 DIAGNOSIS — E039 Hypothyroidism, unspecified: Secondary | ICD-10-CM | POA: Diagnosis not present

## 2019-01-01 DIAGNOSIS — K529 Noninfective gastroenteritis and colitis, unspecified: Secondary | ICD-10-CM | POA: Diagnosis not present

## 2019-01-01 DIAGNOSIS — G894 Chronic pain syndrome: Secondary | ICD-10-CM | POA: Diagnosis not present

## 2019-01-01 DIAGNOSIS — U071 COVID-19: Secondary | ICD-10-CM

## 2019-01-01 MED ORDER — COLESEVELAM HCL 625 MG PO TABS: 625.0000 mg | ORAL_TABLET | Freq: Two times a day (BID) | ORAL | 3 refills | Status: DC

## 2019-01-01 NOTE — Progress Notes (Addendum)
Virtual visit completed through [facility]. Due to national recommendations of social distancing due to COVID-19, a virtual visit is felt to be most appropriate for this patient at this time. Reviewed limitations of a virtual visit.   Patient location: home Provider location: [place] at [facility], office If any vitals were documented, they were collected by patient at home unless specified below.    BP 119/87   Pulse 89   Temp (!) 96.5 F (35.8 C)   Ht 5\' 7"  (1.702 m)   Wt 176 lb (79.8 kg)   LMP 10/04/2014 Comment: per labs  BMI 27.57 kg/m    CC: diarrhea Subjective:    Patient ID: Karen Aguilar, female    DOB: 01/12/1965, 53 y.o.   MRN: [id number]  HPI: Karen Aguilar is a 53 y.o. female presenting on 01/01/2019 for Diarrhea (C/o persistent diarrhea. )   1+ yr h/o diarrhea presumed IBS-D. Ongoing trouble. Stool urgency. Describes initial loose stool that transitions to watery stools, can be up to 8 episodes. Some abd pain with diarrhea. Sometimes dark and sticky and more malodorous than normal. No pale stools or mucous in stools or more buoyant stools. No red blood in stool. Bottom can get raw from diarrhea.   01/2018 GI pathogen panel negative. Episodes are not as severe as back then.  No recent abx use.  S/p cholecystectomy 2001.   Already limits diet due to GERD and digestive issues. Continues diet recommended by [facility] - avoids trigger foods (ie mushrooms, onions, legumes, fried foods, whole grain breads).   Treating with imodium AD, needs to take 3-4 pills before this improves. After imodium use, can develop over-compensatory constipation. Levsin doesn't really help. Probiotics have not helped in the past.   Has tried stopping CBD oil, armour thyroid, decreasing baclofen without benefit.  Has not taken dronabinol in last several months.   Hypothyroidism - on armour thyroid followed by Dr Gabriel Carina. Latest TSH normal 07/2018.   Covid-19 infection 09/2018 - notes persistent  fatigue and decreased taste/smell.      Relevant past medical, surgical, family and social history reviewed and updated as indicated. Interim medical history since our last visit reviewed. Allergies and medications reviewed and updated. Outpatient Medications Prior to Visit  Medication Sig Dispense Refill  . Ascorbic Acid (VITAMIN C PO) Take 1 tablet by mouth as needed (for immune system support).     . baclofen (LIORESAL) 10 MG tablet Take 15 mg by mouth 3 (three) times daily. Takes 1 tablet daily    . calcium carbonate (TUMS SMOOTHIES) 750 MG chewable tablet Chew 2-3 tablets by mouth 2 (two) times daily.    . camphor-menthol (SARNA) lotion Apply 1 application topically at bedtime as needed for itching.     . Cholecalciferol (VITAMIN D) 2000 units CAPS Take 1 capsule (2,000 Units total) by mouth daily. 30 capsule   . clobetasol ointment (TEMOVATE) AB-123456789 % Apply 1 application topically as needed (for eczema).     . clonazePAM (KLONOPIN) 0.5 MG tablet Take 1 tablet (0.5 mg total) by mouth 2 (two) times daily as needed. for anxiety 60 tablet 0  . diclofenac sodium (VOLTAREN) 1 % GEL APPLY THIN LAYER EXTERNALLY TO THE AFFECTED AREA UP TO FOUR TIMES DAILY AS NEEDED 300 g 0  . dronabinol (MARINOL) 2.5 MG capsule Take 2.5 mg by mouth daily as needed (for nausea and pain).   0  . hyoscyamine (LEVSIN SL) 0.125 MG SL tablet DISSOLVE 1 TABLET(0.125 MG) UNDER THE  TONGUE EVERY 4 HOURS AS NEEDED 30 tablet 3  . IRON PO Take 20 mg by mouth every other day.    . metroNIDAZOLE (METROGEL) 0.75 % gel Apply 1 application topically 2 (two) times daily. (Patient taking differently: Apply 1 application topically 2 (two) times daily. As needed) 45 g 3  . NON FORMULARY Place 1 drop under the tongue 3 (three) times daily. Hemp oil as needed    . NUCYNTA 100 MG TABS Take 1 tablet by mouth every 6 (six) hours.  0  . Phenylephrine-DM-GG-APAP (MUCINEX SINUS-MAX) 5-10-200-325 MG CAPS Take 2 tablets by mouth 2 (two) times daily  as needed (for migraine).     . promethazine (PHENERGAN) 25 MG tablet Take 1 tablet (25 mg total) by mouth every 8 (eight) hours as needed for nausea or vomiting. 30 tablet 0  . sucralfate (CARAFATE) 1 g tablet Take 1 tablet (1 g total) by mouth 2 (two) times daily before a meal. As needed 120 tablet 0  . thyroid (ARMOUR) 60 MG tablet Take 60 mg by mouth daily before breakfast. Three days a week take extra tablet daily     No facility-administered medications prior to visit.     Per HPI unless specifically indicated in ROS section below Review of Systems Objective:    BP 119/87   Pulse 89   Temp (!) 96.5 F (35.8 C)   Ht 5\' 7"  (1.702 m)   Wt 176 lb (79.8 kg)   LMP 10/04/2014 Comment: per labs  BMI 27.57 kg/m   Wt Readings from Last 3 Encounters:  01/01/19 176 lb (79.8 kg)  09/21/18 170 lb (77.1 kg)  08/18/18 174 lb (78.9 kg)     Physical exam: Gen: alert, NAD, not ill appearing Pulm: speaks in complete sentences without increased work of breathing Psych: normal mood, normal thought content      Results for orders placed or performed during the hospital encounter of 09/21/18  Novel Coronavirus, NAA (Hosp order, Send-out to Ref Lab; TAT 18-24 hrs   Specimen: Nasopharyngeal Swab; Respiratory  Result Value Ref Range   SARS-CoV-2, NAA DETECTED (A) NOT DETECTED   Coronavirus Source NASOPHARYNGEAL    Lab Results  Component Value Date   TSH 1.00 02/27/2017    Assessment & Plan:  She will call back to schedule physical exam.  Problem List Items Addressed This Visit    Irritable bowel syndrome    Diarrhea predominant. Has seen [facility], following restrictive diet.  Continues levsin PRN. Did not benfit from probiotics.      Hypothyroidism    Recent thyroid function stable. Has endo f/u scheduled 01/2019.       COVID-19 virus infection    Some persistent fatigue after Covid-19 illness this fall.       Chronic pain syndrome    On Nucynta followed by Dr Hardin Negus at  [facility].       Chronic diarrhea - Primary    H/o IBS, predominant diarrhea. She is also s/p cholecystectomy.  GI pathogen panel negative (01/2018), symptoms have lessed some since then but persist.  There may be bile acid malabsorption contributing to symptoms - will trial bile acid sequestrant. Reviewed mechanism of action of benefit of medication.  Will start low dose wellchol 625mg  bid, update with effect.  Send iFOB given some endorsed black tarry stools.       Relevant Orders   Fecal occult blood, imunochemical       Meds ordered this encounter  Medications  . colesevelam (WELCHOL) 625 MG tablet    Sig: Take 1 tablet (625 mg total) by mouth 2 (two) times daily with a meal.    Dispense:  60 tablet    Refill:  3   Orders Placed This Encounter  Procedures  . Fecal occult blood, imunochemical    Standing Status:   Future    Standing Expiration Date:   01/01/2020    I discussed the assessment and treatment plan with the patient. The patient was provided an opportunity to ask questions and all were answered. The patient agreed with the plan and demonstrated an understanding of the instructions. The patient was advised to call back or seek an in-person evaluation if the symptoms worsen or if the condition fails to improve as anticipated.  Follow up plan: No follow-ups on file.  Ria Bush, MD

## 2019-01-03 NOTE — Assessment & Plan Note (Signed)
Diarrhea predominant. Has seen [facility], following restrictive diet.  Continues levsin PRN. Did not benfit from probiotics.

## 2019-01-03 NOTE — Assessment & Plan Note (Signed)
Some persistent fatigue after Covid-19 illness this fall.

## 2019-01-03 NOTE — Assessment & Plan Note (Addendum)
H/o IBS, predominant diarrhea. She is also s/p cholecystectomy.  GI pathogen panel negative (01/2018), symptoms have lessed some since then but persist.  There may be bile acid malabsorption contributing to symptoms - will trial bile acid sequestrant. Reviewed mechanism of action of benefit of medication.  Will start low dose wellchol 625mg  bid, update with effect.  Send iFOB given some endorsed black tarry stools.

## 2019-01-03 NOTE — Assessment & Plan Note (Signed)
On Nucynta followed by Dr Hardin Negus at [facility].

## 2019-01-03 NOTE — Assessment & Plan Note (Signed)
Recent thyroid function stable. Has endo f/u scheduled 01/2019.

## 2019-01-05 ENCOUNTER — Telehealth: Payer: Self-pay

## 2019-01-05 NOTE — Telephone Encounter (Signed)
Form from insurance for PA on Colesevelam placed on Dr Synthia Innocent desk to review. Filled out information that was available to me.

## 2019-01-05 NOTE — Telephone Encounter (Signed)
Filled and in Lisa's box 

## 2019-01-07 NOTE — Telephone Encounter (Signed)
Faxed form over to the insurance and awaiting reply from them now

## 2019-01-08 ENCOUNTER — Encounter: Payer: Self-pay | Admitting: Family Medicine

## 2019-01-08 NOTE — Telephone Encounter (Signed)
Noted thank you. Will await insurance response.  Initial diagnosis was incorrect - this is being prescribed for chronic diarrhea.

## 2019-01-08 NOTE — Telephone Encounter (Addendum)
Received a call from [place] with [place] [facility] Dept Colesevelam (Welchol) 625mg  was denied d/t the diagnosis code provided K52.9 "noninfectious gastroenteritis".  Pt was seen on 01/01/2019 in a Video Visit with Dr Danise Mina and prescribed this medication for IBS and Chronic Diarrhea.           Patient Insurance ID# [id number]  Advised to call the appeal department is wanting to expedite appeal.  [phone] - expedited appeals, Opt 5 Spoke with Langley Gauss, she is going to fax over the Appeals form -- we will need to correct the diagnosis codes and specify why this needs to be expedited along with any additional information of why the patient needs this medication.   Will send to Dr Darnell Level as [name] Will also send to Lattie Haw to keep an eye out for the [facility] paperwork

## 2019-01-08 NOTE — Telephone Encounter (Signed)
Noted  

## 2019-01-08 NOTE — Telephone Encounter (Signed)
Received a call again from [place] to update on status of PA This has been sent to Expedited Appeals dept - will have a determination within 72hours.   Additional clinical information given over the phone to help determine coverage. Diagnosis explained as to why this is being used for Chronic Diarrhea / IBS and not Hyperlipidemia.   Chronic diarrhea - Primary H/o IBS, predominant diarrhea. She is also s/p cholecystectomy.  GI pathogen panel negative (01/2018), symptoms have lessed some since then but persist.  There may be bile acid malabsorption contributing to symptoms - will trial bile acid sequestrant. Reviewed mechanism of action of benefit of medication.  Will start low dose wellchol 625mg  bid, update with effect.  Send iFOB given some endorsed black tarry stools.  Sending to Dr Darnell Level and Lattie Haw as [name] that this has gone to Appeal and unless there is additional clinical information to be added they do not need the form faxed over.

## 2019-01-08 NOTE — Telephone Encounter (Signed)
Please see telephone encounter for the PA of the Colesevelam 625mg    Pt aware that we do not carry samples.   Pt also aware that we are working on an appeal for this with her insurance. Awaiting paperwork which is being faxed today by [facility]

## 2019-01-09 NOTE — Telephone Encounter (Signed)
Received message from [place] from [facility] that said Appeal for generic Karen Aguilar has been approved and they will send fax with more information to [place]. I called and let patient know this information and that pharmacy should not have any issues with this now. Sending to PCP as [name]

## 2019-01-22 ENCOUNTER — Encounter: Payer: Self-pay | Admitting: Family Medicine

## 2019-01-22 DIAGNOSIS — R195 Other fecal abnormalities: Secondary | ICD-10-CM

## 2019-01-22 DIAGNOSIS — K529 Noninfective gastroenteritis and colitis, unspecified: Secondary | ICD-10-CM

## 2019-01-23 ENCOUNTER — Telehealth: Payer: Self-pay | Admitting: Radiology

## 2019-01-23 ENCOUNTER — Other Ambulatory Visit (INDEPENDENT_AMBULATORY_CARE_PROVIDER_SITE_OTHER): Payer: Medicare Other

## 2019-01-23 DIAGNOSIS — K529 Noninfective gastroenteritis and colitis, unspecified: Secondary | ICD-10-CM

## 2019-01-23 LAB — FECAL OCCULT BLOOD, IMMUNOCHEMICAL: Fecal Occult Bld: POSITIVE — AB

## 2019-01-23 NOTE — Telephone Encounter (Signed)
[facility] called a positive ifob, results given to Dr Gutierrez 

## 2019-01-24 NOTE — Telephone Encounter (Signed)
Noted  

## 2019-01-26 DIAGNOSIS — E063 Autoimmune thyroiditis: Secondary | ICD-10-CM | POA: Diagnosis not present

## 2019-01-26 DIAGNOSIS — E038 Other specified hypothyroidism: Secondary | ICD-10-CM | POA: Diagnosis not present

## 2019-01-28 ENCOUNTER — Encounter: Payer: Self-pay | Admitting: Family Medicine

## 2019-01-28 NOTE — Telephone Encounter (Signed)
Would like to see if [facility] (Dr Earnie Larsson who she's previously seen) would see patient for positive iFOB. Thanks.

## 2019-01-28 NOTE — Addendum Note (Signed)
Addended by: Ria Bush on: 01/28/2019 07:27 AM   Modules accepted: Orders

## 2019-01-29 ENCOUNTER — Encounter: Payer: Self-pay | Admitting: Family Medicine

## 2019-01-29 ENCOUNTER — Other Ambulatory Visit: Payer: Self-pay | Admitting: Family Medicine

## 2019-01-29 NOTE — Telephone Encounter (Signed)
Name of Medication: Clonazepam Name of [facility]acy: [facility] Date and Quantity: 12/31/18 Last Office Visit and Type: 01/01/19, acute Next Office Visit and Type: none Last Controlled Substance Agreement Date: 06/10/13 Last UDS: 06/10/13, scanned

## 2019-01-30 NOTE — Telephone Encounter (Signed)
See mychart message. New dose sent in.

## 2019-02-04 ENCOUNTER — Encounter: Payer: Self-pay | Admitting: Family Medicine

## 2019-02-04 DIAGNOSIS — E038 Other specified hypothyroidism: Secondary | ICD-10-CM | POA: Diagnosis not present

## 2019-02-04 DIAGNOSIS — G894 Chronic pain syndrome: Secondary | ICD-10-CM | POA: Diagnosis not present

## 2019-02-04 DIAGNOSIS — M961 Postlaminectomy syndrome, not elsewhere classified: Secondary | ICD-10-CM | POA: Diagnosis not present

## 2019-02-04 DIAGNOSIS — E063 Autoimmune thyroiditis: Secondary | ICD-10-CM | POA: Diagnosis not present

## 2019-02-04 DIAGNOSIS — Z79891 Long term (current) use of opiate analgesic: Secondary | ICD-10-CM | POA: Diagnosis not present

## 2019-02-04 DIAGNOSIS — M62831 Muscle spasm of calf: Secondary | ICD-10-CM | POA: Diagnosis not present

## 2019-02-05 ENCOUNTER — Encounter: Payer: Self-pay | Admitting: Family Medicine

## 2019-02-06 MED ORDER — COLESEVELAM HCL 625 MG PO TABS: 1250.0000 mg | ORAL_TABLET | Freq: Two times a day (BID) | ORAL | 6 refills | Status: DC

## 2019-02-20 DIAGNOSIS — Z01812 Encounter for preprocedural laboratory examination: Secondary | ICD-10-CM | POA: Diagnosis not present

## 2019-02-20 DIAGNOSIS — R197 Diarrhea, unspecified: Secondary | ICD-10-CM | POA: Diagnosis not present

## 2019-02-25 DIAGNOSIS — R197 Diarrhea, unspecified: Secondary | ICD-10-CM | POA: Diagnosis not present

## 2019-03-02 HISTORY — PX: COLONOSCOPY: SHX174

## 2019-03-02 HISTORY — PX: ESOPHAGOGASTRODUODENOSCOPY: SHX1529

## 2019-03-04 ENCOUNTER — Other Ambulatory Visit: Payer: Self-pay | Admitting: Family Medicine

## 2019-03-05 NOTE — Telephone Encounter (Signed)
Name of Medication: Clonazepam Name of [facility]acy: [facility] Date and Quantity: 01/30/19, #60 Last Office Visit and Type: 01/01/19, diarrhea Next Office Visit and Type: none Last Controlled Substance Agreement Date: 06/10/13 Last UDS:  06/10/13, scanned

## 2019-03-06 NOTE — Telephone Encounter (Signed)
ER

## 2019-03-07 DIAGNOSIS — Z20822 Contact with and (suspected) exposure to covid-19: Secondary | ICD-10-CM | POA: Diagnosis not present

## 2019-03-07 DIAGNOSIS — Z01812 Encounter for preprocedural laboratory examination: Secondary | ICD-10-CM | POA: Diagnosis not present

## 2019-03-10 ENCOUNTER — Encounter: Payer: Self-pay | Admitting: Family Medicine

## 2019-03-10 DIAGNOSIS — M797 Fibromyalgia: Secondary | ICD-10-CM | POA: Diagnosis not present

## 2019-03-10 DIAGNOSIS — R195 Other fecal abnormalities: Secondary | ICD-10-CM | POA: Diagnosis not present

## 2019-03-10 DIAGNOSIS — K209 Esophagitis, unspecified without bleeding: Secondary | ICD-10-CM | POA: Diagnosis not present

## 2019-03-10 DIAGNOSIS — K5710 Diverticulosis of small intestine without perforation or abscess without bleeding: Secondary | ICD-10-CM | POA: Diagnosis not present

## 2019-03-10 DIAGNOSIS — K529 Noninfective gastroenteritis and colitis, unspecified: Secondary | ICD-10-CM | POA: Diagnosis not present

## 2019-03-10 DIAGNOSIS — D124 Benign neoplasm of descending colon: Secondary | ICD-10-CM | POA: Diagnosis not present

## 2019-03-10 DIAGNOSIS — K208 Other esophagitis without bleeding: Secondary | ICD-10-CM | POA: Diagnosis not present

## 2019-03-10 DIAGNOSIS — K2100 Gastro-esophageal reflux disease with esophagitis, without bleeding: Secondary | ICD-10-CM | POA: Diagnosis not present

## 2019-03-10 DIAGNOSIS — Z888 Allergy status to other drugs, medicaments and biological substances status: Secondary | ICD-10-CM | POA: Diagnosis not present

## 2019-03-10 DIAGNOSIS — J449 Chronic obstructive pulmonary disease, unspecified: Secondary | ICD-10-CM | POA: Diagnosis not present

## 2019-03-10 DIAGNOSIS — E038 Other specified hypothyroidism: Secondary | ICD-10-CM | POA: Diagnosis not present

## 2019-03-10 DIAGNOSIS — K219 Gastro-esophageal reflux disease without esophagitis: Secondary | ICD-10-CM | POA: Diagnosis not present

## 2019-03-10 DIAGNOSIS — K766 Portal hypertension: Secondary | ICD-10-CM | POA: Diagnosis not present

## 2019-03-10 DIAGNOSIS — Z882 Allergy status to sulfonamides status: Secondary | ICD-10-CM | POA: Diagnosis not present

## 2019-03-10 DIAGNOSIS — R197 Diarrhea, unspecified: Secondary | ICD-10-CM | POA: Diagnosis not present

## 2019-03-10 DIAGNOSIS — M069 Rheumatoid arthritis, unspecified: Secondary | ICD-10-CM | POA: Diagnosis not present

## 2019-03-10 DIAGNOSIS — G4730 Sleep apnea, unspecified: Secondary | ICD-10-CM | POA: Diagnosis not present

## 2019-03-10 DIAGNOSIS — K3189 Other diseases of stomach and duodenum: Secondary | ICD-10-CM | POA: Diagnosis not present

## 2019-03-10 DIAGNOSIS — K259 Gastric ulcer, unspecified as acute or chronic, without hemorrhage or perforation: Secondary | ICD-10-CM | POA: Diagnosis not present

## 2019-03-10 DIAGNOSIS — Z880 Allergy status to penicillin: Secondary | ICD-10-CM | POA: Diagnosis not present

## 2019-03-11 ENCOUNTER — Encounter: Payer: Self-pay | Admitting: Family Medicine

## 2019-03-13 ENCOUNTER — Encounter: Payer: Self-pay | Admitting: Family Medicine

## 2019-03-18 DIAGNOSIS — E038 Other specified hypothyroidism: Secondary | ICD-10-CM | POA: Diagnosis not present

## 2019-03-18 DIAGNOSIS — E063 Autoimmune thyroiditis: Secondary | ICD-10-CM | POA: Diagnosis not present

## 2019-03-24 ENCOUNTER — Encounter: Payer: Self-pay | Admitting: Family Medicine

## 2019-03-25 ENCOUNTER — Encounter: Payer: Self-pay | Admitting: Family Medicine

## 2019-03-25 MED ORDER — COLESEVELAM HCL 625 MG PO TABS: 1250.0000 mg | ORAL_TABLET | Freq: Two times a day (BID) | ORAL | 6 refills | Status: DC

## 2019-04-01 DIAGNOSIS — M961 Postlaminectomy syndrome, not elsewhere classified: Secondary | ICD-10-CM | POA: Diagnosis not present

## 2019-04-01 DIAGNOSIS — G894 Chronic pain syndrome: Secondary | ICD-10-CM | POA: Diagnosis not present

## 2019-04-01 DIAGNOSIS — Z79891 Long term (current) use of opiate analgesic: Secondary | ICD-10-CM | POA: Diagnosis not present

## 2019-04-01 DIAGNOSIS — M62831 Muscle spasm of calf: Secondary | ICD-10-CM | POA: Diagnosis not present

## 2019-04-15 DIAGNOSIS — K838 Other specified diseases of biliary tract: Secondary | ICD-10-CM | POA: Diagnosis not present

## 2019-04-15 DIAGNOSIS — K3189 Other diseases of stomach and duodenum: Secondary | ICD-10-CM | POA: Diagnosis not present

## 2019-04-15 DIAGNOSIS — K766 Portal hypertension: Secondary | ICD-10-CM | POA: Diagnosis not present

## 2019-04-17 ENCOUNTER — Other Ambulatory Visit: Payer: Self-pay | Admitting: Family Medicine

## 2019-04-18 NOTE — Telephone Encounter (Signed)
Last filled 03-06-19 #60 Last OV 12*-31-20 Acute No Future OV [facility]

## 2019-04-22 NOTE — Telephone Encounter (Signed)
ERx 

## 2019-04-24 ENCOUNTER — Ambulatory Visit: Payer: Medicare Other | Admitting: Family Medicine

## 2019-04-24 ENCOUNTER — Encounter: Payer: Self-pay | Admitting: Family Medicine

## 2019-04-24 ENCOUNTER — Other Ambulatory Visit (INDEPENDENT_AMBULATORY_CARE_PROVIDER_SITE_OTHER): Payer: Medicare Other

## 2019-04-24 ENCOUNTER — Ambulatory Visit (INDEPENDENT_AMBULATORY_CARE_PROVIDER_SITE_OTHER)
Admission: RE | Admit: 2019-04-24 | Discharge: 2019-04-24 | Disposition: A | Discharge status: Home / Self Care | Payer: Medicare Other | Source: Ambulatory Visit | Attending: Family Medicine | Admitting: Family Medicine

## 2019-04-24 ENCOUNTER — Other Ambulatory Visit: Payer: Self-pay

## 2019-04-24 ENCOUNTER — Telehealth (INDEPENDENT_AMBULATORY_CARE_PROVIDER_SITE_OTHER): Payer: Medicare Other | Admitting: Family Medicine

## 2019-04-24 ENCOUNTER — Telehealth: Payer: Medicare Other | Admitting: Family Medicine

## 2019-04-24 VITALS — BP 118/86 | HR 118 | Temp 97.6°F | Ht 67.0 in | Wt 171.0 lb

## 2019-04-24 DIAGNOSIS — K529 Noninfective gastroenteritis and colitis, unspecified: Secondary | ICD-10-CM | POA: Diagnosis not present

## 2019-04-24 DIAGNOSIS — R1011 Right upper quadrant pain: Secondary | ICD-10-CM

## 2019-04-24 DIAGNOSIS — K5989 Other specified functional intestinal disorders: Secondary | ICD-10-CM | POA: Diagnosis not present

## 2019-04-24 DIAGNOSIS — R350 Frequency of micturition: Secondary | ICD-10-CM | POA: Diagnosis not present

## 2019-04-24 LAB — CBC WITH DIFFERENTIAL/PLATELET
Basophils Absolute: 0 10*3/uL (ref 0.0–0.1)
Basophils Relative: 0.1 % (ref 0.0–3.0)
Eosinophils Absolute: 0 10*3/uL (ref 0.0–0.7)
Eosinophils Relative: 0 % (ref 0.0–5.0)
HCT: 43.6 % (ref 36.0–46.0)
Hemoglobin: 14.8 g/dL (ref 12.0–15.0)
Lymphocytes Relative: 17.8 % (ref 12.0–46.0)
Lymphs Abs: 1.8 10*3/uL (ref 0.7–4.0)
MCHC: 33.9 g/dL (ref 30.0–36.0)
MCV: 87.1 fl (ref 78.0–100.0)
Monocytes Absolute: 0.8 10*3/uL (ref 0.1–1.0)
Monocytes Relative: 7.4 % (ref 3.0–12.0)
Neutro Abs: 7.6 10*3/uL (ref 1.4–7.7)
Neutrophils Relative %: 74.7 % (ref 43.0–77.0)
Platelets: 295 10*3/uL (ref 150.0–400.0)
RBC: 5.01 Mil/uL (ref 3.87–5.11)
RDW: 13.5 % (ref 11.5–15.5)
WBC: 10.2 10*3/uL (ref 4.0–10.5)

## 2019-04-24 LAB — POC URINALSYSI DIPSTICK (AUTOMATED)
Bilirubin, UA: NEGATIVE
Blood, UA: NEGATIVE
Glucose, UA: NEGATIVE
Ketones, UA: NEGATIVE
Leukocytes, UA: NEGATIVE
Nitrite, UA: NEGATIVE
Protein, UA: POSITIVE — AB
Spec Grav, UA: >=1.03 — AB (ref 1.010–1.025)
Urobilinogen, UA: 0.2 E.U./dL
pH, UA: 5 (ref 5.0–8.0)

## 2019-04-24 LAB — COMPREHENSIVE METABOLIC PANEL
ALT: 86 U/L — ABNORMAL HIGH (ref 0–35)
AST: 62 U/L — ABNORMAL HIGH (ref 0–37)
Albumin: 4.4 g/dL (ref 3.5–5.2)
Alkaline Phosphatase: 106 U/L (ref 39–117)
BUN: 7 mg/dL (ref 6–23)
CO2: 33 mEq/L — ABNORMAL HIGH (ref 19–32)
Calcium: 9.7 mg/dL (ref 8.4–10.5)
Chloride: 98 mEq/L (ref 96–112)
Creatinine, Ser: 0.92 mg/dL (ref 0.40–1.20)
GFR: 63.86 mL/min (ref >60.00)
Glucose, Bld: 110 mg/dL — ABNORMAL HIGH (ref 70–99)
Potassium: 4.1 mEq/L (ref 3.5–5.1)
Sodium: 138 mEq/L (ref 135–145)
Total Bilirubin: 0.7 mg/dL (ref 0.2–1.2)
Total Protein: 7.7 g/dL (ref 6.0–8.3)

## 2019-04-24 LAB — LIPASE: Lipase: 11 U/L (ref 11.0–59.0)

## 2019-04-24 MED ORDER — PROMETHAZINE HCL 25 MG PO TABS: 25.0000 mg | ORAL_TABLET | Freq: Three times a day (TID) | ORAL | 0 refills | Status: DC | PRN

## 2019-04-24 NOTE — Assessment & Plan Note (Signed)
Welchol has helped chronic bile salt diarrhea

## 2019-04-24 NOTE — Assessment & Plan Note (Signed)
Several weeks of progressively worsening RUQ abd pain with radiation to lower back associated with nausea, bloating, worse with meals. Tachycardic today suggestive of dehydration. Discussed options of ER eval vs stat labs in office - she declines ER. Reviewed ER precautions over the weekend.  I have asked her to come in for stat labs (CBC , CMP lipase) and 2 view abd and UA.  Rx phenergan PRN nausea which is effective.  Welchol may be contributing to symptoms - advised stop until symptoms improved. She is currently not experiencing diarrhea.

## 2019-04-24 NOTE — Addendum Note (Signed)
Addended by: Brenton Grills on: [id number] AM   Modules accepted: Orders

## 2019-04-24 NOTE — Progress Notes (Signed)
Virtual visit completed through [facility]. Due to national recommendations of social distancing due to COVID-19, a virtual visit is felt to be most appropriate for this patient at this time. Reviewed limitations of a virtual visit.   Patient location: home Provider location: [place] at [facility], office If any vitals were documented, they were collected by patient at home unless specified below.    BP 118/86   Pulse (!) 118   Temp 97.6 F (36.4 C)   Ht 5\' 7"  (1.702 m)   Wt 171 lb (77.6 kg)   LMP 10/04/2014 Comment: per labs  BMI 26.78 kg/m   Pulse Readings from Last 3 Encounters:  04/24/19 (!) 118  01/01/19 89  09/21/18 (!) 105  Repeat BP at home - 142/85, HR 121 with irregular rhythm.   CC: abd pain Subjective:    Patient ID: Karen Aguilar, female    DOB: 01/21/1965, 54 y.o.   MRN: [id number]  HPI: Karen Aguilar is a 54 y.o. female presenting on 04/24/2019 for Flank Pain (C/o right side pain radiating around to back.  Sxs started about 3 wks ago.  Also, c/o fatigue, bloating, gas pain and burping even when pt has not eaten.  Sxs are worsening.  States food makes her nauseous.  Denies any vomiting or diarrhea. )   3 wk h/o progressively worsening nausea, RUQ pain worse with any meal, constant belching, burping, bloating. Pain can radiate to right back/lower back. Some cold chilss. Vomited over the weekend, not since. Worse pain after eating. She is passing gas and having bowel movements. Increased migraines recently. Some urinary pressure. Also with significant drainage and ST - attributed to viral illness.   Grand daughter recently had GI virus - she and her daughter caught it.  She feels she's been staying well hydrated.   No fevers, vomiting, new diarrhea, constipation. No blood in stool. No dysuria, hematuria, urgency.  No shoulder blade pain.   Finds welchol helpful for bile acid diarrhea after gallbladder surgery - was on 1250mg  BID, dropped to 625mg  BID.  Has some  phenergan at home - will refill.   Recent EGD and colonoscopy through GI, records in chart. Portal hypertensive gastropathy. Planned GI f/u 08/2019.  Recent liver ultrasound - stable hemangiomas in liver, dilated CBD s/p cholecystectomy, largely unchanged.   Has decided to hold on covid vaccine for now.      Relevant past medical, surgical, family and social history reviewed and updated as indicated. Interim medical history since our last visit reviewed. Allergies and medications reviewed and updated. Outpatient Medications Prior to Visit  Medication Sig Dispense Refill  . Ascorbic Acid (VITAMIN C PO) Take 1 tablet by mouth as needed (for immune system support).     . baclofen (LIORESAL) 10 MG tablet Take 15 mg by mouth 3 (three) times daily. Takes 1 tablet daily    . calcium carbonate (TUMS SMOOTHIES) 750 MG chewable tablet Chew 2-3 tablets by mouth 2 (two) times daily.    . camphor-menthol (SARNA) lotion Apply 1 application topically at bedtime as needed for itching.     . Cholecalciferol (VITAMIN D) 2000 units CAPS Take 1 capsule (2,000 Units total) by mouth daily. 30 capsule   . clobetasol ointment (TEMOVATE) [id number] % Apply 1 application topically as needed (for eczema).     . clonazePAM (KLONOPIN) 1 MG tablet TAKE ONE-HALF TABLET BY MOUTH EVERY DAY AND 1 TABLET AT BEDTIME 60 tablet 0  . colesevelam (WELCHOL) 625 MG tablet Take  2 tablets (1,250 mg total) by mouth 2 (two) times daily with a meal. 120 tablet 6  . diclofenac sodium (VOLTAREN) 1 % GEL APPLY THIN LAYER EXTERNALLY TO THE AFFECTED AREA UP TO FOUR TIMES DAILY AS NEEDED 300 g 0  . dronabinol (MARINOL) 2.5 MG capsule Take 2.5 mg by mouth daily as needed (for nausea and pain).   0  . hyoscyamine (LEVSIN SL) 0.125 MG SL tablet DISSOLVE 1 TABLET(0.125 MG) UNDER THE TONGUE EVERY 4 HOURS AS NEEDED 30 tablet 3  . IRON PO Take 20 mg by mouth every other day.    . metroNIDAZOLE (METROGEL) 0.75 % gel Apply 1 application topically 2 (two) times  daily. (Patient taking differently: Apply 1 application topically 2 (two) times daily. As needed) 45 g 3  . NON FORMULARY Place 1 drop under the tongue 3 (three) times daily. Hemp oil as needed    . NUCYNTA 100 MG TABS Take 1 tablet by mouth every 6 (six) hours.  0  . Phenylephrine-DM-GG-APAP (MUCINEX SINUS-MAX) 5-10-200-325 MG CAPS Take 2 tablets by mouth 2 (two) times daily as needed (for migraine).     . sucralfate (CARAFATE) 1 g tablet Take 1 tablet (1 g total) by mouth 2 (two) times daily before a meal. As needed 120 tablet 0  . thyroid (ARMOUR) 60 MG tablet Take 60 mg by mouth daily before breakfast. Three days a week take extra tablet daily    . promethazine (PHENERGAN) 25 MG tablet Take 1 tablet (25 mg total) by mouth every 8 (eight) hours as needed for nausea or vomiting. 30 tablet 0   No facility-administered medications prior to visit.     Per HPI unless specifically indicated in ROS section below Review of Systems Objective:    BP 118/86   Pulse (!) 118   Temp 97.6 F (36.4 C)   Ht 5\' 7"  (1.702 m)   Wt 171 lb (77.6 kg)   LMP 10/04/2014 Comment: per labs  BMI 26.78 kg/m   Wt Readings from Last 3 Encounters:  04/24/19 171 lb (77.6 kg)  01/01/19 176 lb (79.8 kg)  09/21/18 170 lb (77.1 kg)     Physical exam: Gen: alert, NAD, not ill appearing Pulm: speaks in complete sentences without increased work of breathing Psych: normal mood, normal thought content      Results for orders placed or performed in visit on 01/23/19  Fecal occult blood, imunochemical   Specimen: Stool  Result Value Ref Range   Fecal Occult Bld Positive (A) Negative   Lab Results  Component Value Date   TSH 1.00 02/27/2017    Assessment & Plan:   Problem List Items Addressed This Visit    RUQ abdominal pain - Primary    Several weeks of progressively worsening RUQ abd pain with radiation to lower back associated with nausea, bloating, worse with meals. Tachycardic today suggestive of  dehydration. Discussed options of ER eval vs stat labs in office - she declines ER. Reviewed ER precautions over the weekend.  I have asked her to come in for stat labs (CBC , CMP lipase) and 2 view abd and UA.  Rx phenergan PRN nausea which is effective.  Welchol may be contributing to symptoms - advised stop until symptoms improved. She is currently not experiencing diarrhea.       Relevant Orders   DG Abd 2 Views   Comprehensive metabolic panel   CBC with Differential/Platelet   Lipase   Chronic diarrhea  Welchol has helped chronic bile salt diarrhea        Other Visit Diagnoses    Urinary frequency           Meds ordered this encounter  Medications  . promethazine (PHENERGAN) 25 MG tablet    Sig: Take 1 tablet (25 mg total) by mouth every 8 (eight) hours as needed for nausea or vomiting.    Dispense:  30 tablet    Refill:  0   Orders Placed This Encounter  Procedures  . DG Abd 2 Views    Standing Status:   Future    Standing Expiration Date:   06/23/2020    Order Specific Question:   Reason for Exam (SYMPTOM  OR DIAGNOSIS REQUIRED)    Answer:   RUQ abd pain, bloating, belching    Order Specific Question:   Is patient pregnant?    Answer:   No    Order Specific Question:   Preferred imaging location?    Answer:   [facility]    Order Specific Question:   Radiology Contrast Protocol - do NOT remove file path    Answer:   [email or web address]  . Comprehensive metabolic panel    Standing Status:   Future    Standing Expiration Date:   04/23/2020  . CBC with Differential/Platelet    Standing Status:   Future    Standing Expiration Date:   04/23/2020  . Lipase    Standing Status:   Future    Standing Expiration Date:   04/23/2020    I discussed the assessment and treatment plan with the patient. The patient was provided an opportunity to ask questions and all were answered. The patient agreed with the plan and demonstrated  an understanding of the instructions. The patient was advised to call back or seek an in-person evaluation if the symptoms worsen or if the condition fails to improve as anticipated.  Follow up plan: No follow-ups on file.  Ria Bush, MD

## 2019-04-25 ENCOUNTER — Other Ambulatory Visit: Payer: Self-pay

## 2019-04-25 ENCOUNTER — Emergency Department
Admission: EM | Admit: 2019-04-25 | Discharge: 2019-04-25 | Disposition: A | Discharge status: Home / Self Care | Payer: Medicare Other | Attending: Emergency Medicine | Admitting: Emergency Medicine

## 2019-04-25 ENCOUNTER — Emergency Department: Payer: Medicare Other

## 2019-04-25 ENCOUNTER — Encounter: Payer: Self-pay | Admitting: Emergency Medicine

## 2019-04-25 DIAGNOSIS — R1011 Right upper quadrant pain: Secondary | ICD-10-CM | POA: Insufficient documentation

## 2019-04-25 DIAGNOSIS — R110 Nausea: Secondary | ICD-10-CM | POA: Insufficient documentation

## 2019-04-25 DIAGNOSIS — G8929 Other chronic pain: Secondary | ICD-10-CM | POA: Diagnosis not present

## 2019-04-25 DIAGNOSIS — E039 Hypothyroidism, unspecified: Secondary | ICD-10-CM | POA: Insufficient documentation

## 2019-04-25 DIAGNOSIS — Z79899 Other long term (current) drug therapy: Secondary | ICD-10-CM | POA: Diagnosis not present

## 2019-04-25 DIAGNOSIS — R109 Unspecified abdominal pain: Secondary | ICD-10-CM | POA: Diagnosis not present

## 2019-04-25 DIAGNOSIS — R112 Nausea with vomiting, unspecified: Secondary | ICD-10-CM | POA: Diagnosis not present

## 2019-04-25 LAB — URINALYSIS, COMPLETE (UACMP) WITH MICROSCOPIC
Bacteria, UA: NONE SEEN
Glucose, UA: NEGATIVE mg/dL
Leukocytes,Ua: NEGATIVE
Nitrite: NEGATIVE
Specific Gravity, Urine: >1.03 — ABNORMAL HIGH (ref 1.005–1.030)
pH: 5 (ref 5.0–8.0)

## 2019-04-25 LAB — CBC WITH DIFFERENTIAL/PLATELET
Abs Immature Granulocytes: 0.02 10*3/uL (ref 0.00–0.07)
Basophils Absolute: 0 10*3/uL (ref 0.0–0.1)
Basophils Relative: 0 %
Eosinophils Absolute: 0 10*3/uL (ref 0.0–0.5)
Eosinophils Relative: 0 %
HCT: 46.5 % — ABNORMAL HIGH (ref 36.0–46.0)
Hemoglobin: 15 g/dL (ref 12.0–15.0)
Immature Granulocytes: 0 %
Lymphocytes Relative: 20 %
Lymphs Abs: 1.4 10*3/uL (ref 0.7–4.0)
MCH: 28.7 pg (ref 26.0–34.0)
MCHC: 32.3 g/dL (ref 30.0–36.0)
MCV: 88.9 fL (ref 80.0–100.0)
Monocytes Absolute: 0.4 10*3/uL (ref 0.1–1.0)
Monocytes Relative: 6 %
Neutro Abs: 5.1 10*3/uL (ref 1.7–7.7)
Neutrophils Relative %: 74 %
Platelets: 249 10*3/uL (ref 150–400)
RBC: 5.23 MIL/uL — ABNORMAL HIGH (ref 3.87–5.11)
RDW: 13.2 % (ref 11.5–15.5)
WBC: 7 10*3/uL (ref 4.0–10.5)
nRBC: 0 % (ref 0.0–0.2)

## 2019-04-25 LAB — COMPREHENSIVE METABOLIC PANEL
ALT: 76 U/L — ABNORMAL HIGH (ref 0–44)
AST: 42 U/L — ABNORMAL HIGH (ref 15–41)
Albumin: 4.3 g/dL (ref 3.5–5.0)
Alkaline Phosphatase: 93 U/L (ref 38–126)
Anion gap: 6 (ref 5–15)
BUN: 9 mg/dL (ref 6–20)
CO2: 33 mmol/L — ABNORMAL HIGH (ref 22–32)
Calcium: 9.5 mg/dL (ref 8.9–10.3)
Chloride: 100 mmol/L (ref 98–111)
Creatinine, Ser: 0.87 mg/dL (ref 0.44–1.00)
GFR calc Af Amer: >60 mL/min (ref >60)
GFR calc non Af Amer: >60 mL/min (ref >60)
Glucose, Bld: 100 mg/dL — ABNORMAL HIGH (ref 70–99)
Potassium: 4.2 mmol/L (ref 3.5–5.1)
Sodium: 139 mmol/L (ref 135–145)
Total Bilirubin: 1.1 mg/dL (ref 0.3–1.2)
Total Protein: 8.2 g/dL — ABNORMAL HIGH (ref 6.5–8.1)

## 2019-04-25 LAB — LIPASE, BLOOD: Lipase: 21 U/L (ref 11–51)

## 2019-04-25 MED ORDER — MORPHINE SULFATE (PF) 4 MG/ML IV SOLN
4.0000 mg | Freq: Once | INTRAVENOUS | Status: AC
  Administered 2019-04-25: 4 mg via INTRAVENOUS
  Filled 2019-04-25: qty 1

## 2019-04-25 MED ORDER — LACTATED RINGERS IV BOLUS: 1000.0000 mL | Freq: Once | INTRAVENOUS | Status: DC

## 2019-04-25 MED ORDER — ONDANSETRON HCL 4 MG/2ML IJ SOLN
4.0000 mg | Freq: Once | INTRAMUSCULAR | Status: AC
  Administered 2019-04-25: 4 mg via INTRAVENOUS
  Filled 2019-04-25: qty 2

## 2019-04-25 MED ORDER — IOHEXOL 300 MG/ML  SOLN
100.0000 mL | Freq: Once | INTRAMUSCULAR | Status: AC | PRN
  Administered 2019-04-25: 100 mL via INTRAVENOUS

## 2019-04-25 MED ORDER — SODIUM CHLORIDE 0.9 % IV BOLUS
1000.0000 mL | Freq: Once | INTRAVENOUS | Status: AC
  Administered 2019-04-25: 12:00:00 1000 mL via INTRAVENOUS

## 2019-04-25 NOTE — ED Provider Notes (Signed)
[facility] Emergency Department Provider Note   ____________________________________________   First MD Initiated Contact with Patient 04/25/19 1056     (approximate)  I have reviewed the triage vital signs and the nursing notes.   HISTORY  Chief Complaint Abdominal Pain    HPI Karen Aguilar is a 54 y.o. female with medical history of fibromyalgia, GERD, IBS, chronic diarrhea, and thyroiditis who presents to the ED complaining of abdominal pain.  Patient reports that she has been dealing with approximately 3 weeks of constant pain in the right upper quadrant of her abdomen.  She describes it as sharp and not exacerbated or alleviated by anything, but can radiate into her right flank.  She has been feeling nauseous with occasional vomiting, takes Phenergan on a regular basis at home with only partial relief.  She has not had any dysuria, hematuria, diarrhea, or constipation.  She was seen by her PCP for this problem 2 days ago, where chest x-ray showed possible bowel obstruction and she was referred to the ED for further evaluation.  She is status post cholecystectomy.        Past Medical History:  Diagnosis Date  . Abnormal MRI scan, bone 2012   abnormal marrow signal humeral head, glenoid and scapula - diffuse replacement of fatty marrow - likely benign, no need for further investigation ([place])  . Agnosia for temperature    Right Leg  . Anemia    etiology unknown-iron infusion in past,last 9/12  . Anxiety   . Anxiety and depression   . 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 Arthritis   . Chronic pain    pain contract with Dr. Eartha Inch Pain  . COVID-19 virus infection 09/2018  . DDD (degenerative disc disease), cervical   . DDD (degenerative disc disease), lumbar    s/p permanent nerve damage after back surgery  . Depression   . Depression with anxiety   . Diverticulosis   . Eczema   . Family history of adverse reaction to  anesthesia    Mother - nausea  . Fibromyalgia   . Foot drop    right - numbness, tingling - twisting  . Gallstones   . GERD (gastroesophageal reflux disease)    rare zantac PRN N/V at times with this  . GERD (gastroesophageal reflux disease)   . Headache(784.0)    migraines  . Hemorrhoid   . Hemorrhoids   . History of kidney stones   . Hyperlipidemia   . Hypothyroidism 1990s   hashimoto's thyroiditis  . IBS (irritable bowel syndrome)   . IBS (irritable bowel syndrome)   . Incontinence of urine    s/p sling procedure-unresolved  . Iron deficiency    h/o anemia, s/p iron infusion (2012)  . Liver hemangioma     x2  . Memory change    due to medications  . Migraines    and h/o optical migraine  . Migraines   . Mixed urge and stress incontinence 2008   extensive workup including urodynamics, failed miltiple anticholinergics including myrbetriq and urogesic blue s/p PTNS, normal cystoscopy 2014, seen by Dr Matilde Sprang and Dr Erlene Quan  . Neurogenic bladder 2008   after 13 spine surgeries with periph neuropathy and chronic LBP  . Neuropathy of lower extremity 2011  . Nocturia   . Notalgia paresthetica   . Osteoarthritis 12/2013  . PONV (postoperative nausea and vomiting)   . Pulmonary nodule 04/2012   7.17mm RLL nodule -  resolved on imaging 12/2012  . Sleep apnea    mild-doesn't use cpap  . Urine frequency   . Vitamin D deficiency     Patient Active Problem List   Diagnosis Date Noted  . Hepatic hemangioma 09/01/2018  . Lower abdominal pain 04/14/2018  . Mass of chest wall, left 05/09/2017  . Epigastric abdominal pain 02/28/2017  . Non-intractable vomiting with nausea 02/27/2017  . Right leg pain 12/06/2016  . DDD (degenerative disc disease), cervical 06/09/2016  . GAD (generalized anxiety disorder) 02/18/2016  . Right shoulder pain 02/18/2016  . Pelvic pressure in female 01/23/2016  . Post-menopausal 10/10/2015  . Urticaria 09/08/2015  . Neurogenic bladder   . Mixed  urge and stress incontinence   . Chronic headache 09/22/2014  . Advanced care planning/counseling discussion 07/15/2014  . Health maintenance examination 07/15/2014  . Irritable bowel syndrome 02/23/2014  . Esophageal dysphagia 06/05/2013  . Chronic diarrhea 05/12/2013  . Chronic pelvic pain in female 05/05/2013  . Proctalgia fugax 05/05/2013  . Gastroparesis 05/04/2013  . Solitary pulmonary nodule 03/18/2013  . Shortness of breath 03/18/2013  . RUQ abdominal pain 02/10/2013  . Unspecified constipation 02/10/2013  . Internal hemorrhoids 02/10/2013  . Hypothyroidism   . Fibromyalgia   . GERD (gastroesophageal reflux disease)   . Chronic pain syndrome   . Sleep apnea   . MDD (major depressive disorder), recurrent episode, moderate ([place])   . Arachnoiditis   . Hyperlipidemia   . Complicated migraine   . Vitamin D deficiency   . Iron deficiency   . DDD (degenerative disc disease), lumbar     Past Surgical History:  Procedure Laterality Date  . ANTERIOR CERVICAL DECOMP/DISCECTOMY FUSION     C3, 4, 5 - improved after surgery  . ANTERIOR CERVICAL DECOMP/DISCECTOMY FUSION  07/2017   C6/7 Dr Pamala Hurry  . BACK SURGERY     multiple-L4-s1,hardware,fusion,removal,infection s/p 5/11 procedure  . BACK SURGERY  02/16/11   "my 7th back OR; Procedure: Exploration of fusion removal of hardware L4-S1, harvesting of right iliac crest bone graft, redo posterior lateral fusion L4-5 using iliac crest bone graft BMP and master graft replacement of bilateral L4 screws with a Telfa tech 7.5 x 40 mm screw on the right and 8.5 x 40 mm screw on the left placement of a large Hemovac drain  . [place]; 2001   x 2  . CHOLECYSTECTOMY  2001  . COLONOSCOPY  10/2016   rpt 10 yrs Gustavo Lah)  . COLONOSCOPY  03/2019   1 polyp, biopsies taken ([place])  . ESOPHAGEAL MANOMETRY  06/2017   WNL (Dr Earnie Larsson)  . ESOPHAGOGASTRODUODENOSCOPY  03/2013   early esophageal stricture dilated, o/w WNL Deatra Ina)  .  ESOPHAGOGASTRODUODENOSCOPY  12/2014   WNL, s/p empiric dilation of esophagus Henrene Pastor)  . ESOPHAGOGASTRODUODENOSCOPY  10/2015   WNL, biopsies WNL as well (minimal esophagitis with reactive changes) Benson [place])  . ESOPHAGOGASTRODUODENOSCOPY  10/2016   biopsy suggestive of ulcer ([place])  . ESOPHAGOGASTRODUODENOSCOPY  03/2019   mild portal hypertensive gastropathy, gastric erosion without bleeding, duodenal diverticulum ([place])  . HEMORRHOID BANDING    . LASIK Bilateral   . LUMBAR WOUND DEBRIDEMENT  03/15/2011   Procedure: LUMBAR WOUND DEBRIDEMENT;  Surgeon: Elaina Hoops, MD;  Lumbar Wound Debridement  . MANDIBLE RECONSTRUCTION  1986  . PFT  01/28/2013   WNL  . PUBOVAGINAL SLING  2008   midurethral sling  . SHOULDER CLOSED REDUCTION Left 12/18/2010   Procedure: CLOSED MANIPULATION SHOULDER;  Surgeon: Doroteo Bradford  Be[place]; labrial debridement  . SPINAL CORD STIMULATOR INSERTION N/A 08/19/2015   Procedure: LUMBAR SPINAL CORD STIMULATOR INSERTION;  Surgeon: Erline Levine, MD;  Location: [place] [facility];  Service: Neurosurgery;  Laterality: N/A;  LUMBAR SPINAL CORD STIMULATOR INSERTION  . SPINE SURGERY    . TONSILLECTOMY  1972   as a child; "then they grew back"    Prior to Admission medications   Medication Sig Start Date End Date Taking? Authorizing Provider  Ascorbic Acid (VITAMIN C PO) Take 1 tablet by mouth as needed (for immune system support).     [provider]  baclofen (LIORESAL) 10 MG tablet Take 15 mg by mouth 3 (three) times daily. Takes 1 tablet daily    [provider]  calcium carbonate (TUMS SMOOTHIES) 750 MG chewable tablet Chew 2-3 tablets by mouth 2 (two) times daily.    [provider]  camphor-menthol Timoteo Ace) lotion Apply 1 application topically at bedtime as needed for itching.     [provider]  Cholecalciferol (VITAMIN D) 2000 units CAPS Take 1 capsule (2,000 Units total) by mouth daily. 10/10/15   Ria Bush, MD  clobetasol ointment  (TEMOVATE) [id number] % Apply 1 application topically as needed (for eczema).     [provider]  clonazePAM (KLONOPIN) 1 MG tablet TAKE ONE-HALF TABLET BY MOUTH EVERY DAY AND 1 TABLET AT BEDTIME 04/22/19   Ria Bush, MD  colesevelam [facility]) 625 MG tablet Take 2 tablets (1,250 mg total) by mouth 2 (two) times daily with a meal. 03/25/19   Ria Bush, MD  diclofenac sodium (VOLTAREN) 1 % GEL APPLY THIN LAYER EXTERNALLY TO THE AFFECTED AREA UP TO FOUR TIMES DAILY AS NEEDED 12/31/17   Mcarthur Rossetti, MD  dronabinol (MARINOL) 2.5 MG capsule Take 2.5 mg by mouth daily as needed (for nausea and pain).  02/03/14   [provider]  hyoscyamine (LEVSIN SL) 0.125 MG SL tablet DISSOLVE 1 TABLET(0.125 MG) UNDER THE TONGUE EVERY 4 HOURS AS NEEDED 01/15/18   Ria Bush, MD  IRON PO Take 20 mg by mouth every other day.    [provider]  metroNIDAZOLE (METROGEL) 0.75 % gel Apply 1 application topically 2 (two) times daily. Patient taking differently: Apply 1 application topically 2 (two) times daily. As needed 06/08/16   Ria Bush, MD  NON FORMULARY Place 1 drop under the tongue 3 (three) times daily. Hemp oil as needed    [provider]  NUCYNTA 100 MG TABS Take 1 tablet by mouth every 6 (six) hours. 05/23/17   [provider]  Phenylephrine-DM-GG-APAP (MUCINEX SINUS-MAX) 5-10-200-325 MG CAPS Take 2 tablets by mouth 2 (two) times daily as needed (for migraine).     [provider]  promethazine (PHENERGAN) 25 MG tablet Take 1 tablet (25 mg total) by mouth every 8 (eight) hours as needed for nausea or vomiting. 04/24/19   Ria Bush, MD  sucralfate (CARAFATE) 1 g tablet Take 1 tablet (1 g total) by mouth 2 (two) times daily before a meal. As needed 05/13/18   Ria Bush, MD  thyroid (ARMOUR) 60 MG tablet Take 60 mg by mouth daily before breakfast. Three days a week take extra tablet daily 10/10/15   Ria Bush, MD     Allergies Augmentin [amoxicillin-pot clavulanate], Fluconazole, Erythromycin, Influenza vaccines, Levothyroxine, Thimerosal, Tomato, Adhesive [tape], Azithromycin, Gabapentin, Imitrex [sumatriptan], Lyrica [pregabalin], Macrodantin [nitrofurantoin], Macrolides and ketolides, Nickel, Sulfa antibiotics, Sulfasalazine, and Troleandomycin  Family History  Problem Relation Age of Onset  .  Lung cancer Father 62        (smoker)  . Diabetes Brother   . Thyroid disease Mother        ?empty sella  . Irritable bowel syndrome Mother   . Dysphagia Mother   . CAD Maternal Grandfather        MI  . Heart disease Maternal Grandfather   . Heart attack Maternal Grandfather   . Lung cancer Paternal Grandfather         (smoker)  . Colon cancer Paternal Grandmother   . Thyroid disease Cousin   . Cancer Other        unsure (ovarian/uterine)  . Breast cancer Maternal Aunt   . Alzheimer's disease Maternal Grandmother   . Diabetes Daughter   . Breast cancer Maternal Aunt     Social History Social History   Tobacco Use  . Smoking status: Never Smoker  . Smokeless tobacco: Never Used  Substance Use Topics  . Alcohol use: Yes    Alcohol/week: 0.0 standard drinks    Comment: rarely drinks wine  . Drug use: No    Review of Systems  Constitutional: No fever/chills Eyes: No visual changes. ENT: No sore throat. Cardiovascular: Denies chest pain. Respiratory: Denies shortness of breath. Gastrointestinal: Positive for abdominal pain.  Positive for nausea and vomiting.  No diarrhea.  No constipation. Genitourinary: Negative for dysuria. Musculoskeletal: Negative for back pain. Skin: Negative for rash. Neurological: Negative for headaches, focal weakness or numbness.  ____________________________________________   PHYSICAL EXAM:  VITAL SIGNS: ED Triage Vitals  Enc Vitals Group     BP 04/25/19 1049 114/87     Pulse Rate 04/25/19 1049 (!) 125     Resp 04/25/19 1049 16     Temp 04/25/19  1049 99.2 F (37.3 C)     Temp Source 04/25/19 1049 Oral     SpO2 04/25/19 1049 92 %     Weight --      Height --      Head Circumference --      Peak Flow --      Pain Score 04/25/19 1052 3     Pain Loc --      Pain Edu? --      Excl. in [place]? --     Constitutional: Alert and oriented. Eyes: Conjunctivae are normal. Head: Atraumatic. Nose: No congestion/rhinnorhea. Mouth/Throat: Mucous membranes are moist. Neck: Normal ROM Cardiovascular: Normal rate, regular rhythm. Grossly normal heart sounds. Respiratory: Normal respiratory effort.  No retractions. Lungs CTAB. Gastrointestinal: Soft and tender to palpation in the right upper quadrant with no rebound or guarding. No distention. Genitourinary: deferred Musculoskeletal: No lower extremity tenderness nor edema. Neurologic:  Normal speech and language. No gross focal neurologic deficits are appreciated. Skin:  Skin is warm, dry and intact. No rash noted. Psychiatric: Mood and affect are normal. Speech and behavior are normal.  ____________________________________________   LABS (all labs ordered are listed, but only abnormal results are displayed)  Labs Reviewed  CBC WITH DIFFERENTIAL/PLATELET - Abnormal; Notable for the following components:      Result Value   RBC 5.23 (*)    HCT 46.5 (*)    All other components within normal limits  COMPREHENSIVE METABOLIC PANEL - Abnormal; Notable for the following components:   CO2 33 (*)    Glucose, Bld 100 (*)    Total Protein 8.2 (*)    AST 42 (*)    ALT 76 (*)    All other components within normal  limits  URINALYSIS, COMPLETE (UACMP) WITH MICROSCOPIC - Abnormal; Notable for the following components:   Specific Gravity, Urine >1.030 (*)    Hgb urine dipstick TRACE (*)    Bilirubin Urine SMALL (*)    Ketones, ur TRACE (*)    Protein, ur TRACE (*)    All other components within normal limits  LIPASE, BLOOD     PROCEDURES  Procedure(s) performed (including Critical  Care):  Procedures   ____________________________________________   INITIAL IMPRESSION / ASSESSMENT AND PLAN / ED COURSE       54 year old female presents to the ED with 3 weeks of right upper quadrant abdominal pain radiating to her right flank with some nausea and vomiting but no associated urinary symptoms.  She does have focal tenderness in her right upper quadrant, however she is status post cholecystectomy.  Lab work is essentially unremarkable other than very mild transaminitis, similar to prior.  UA is unremarkable and CT scan was negative for acute process.  At this point, there is no apparent emergent pathology or clear explanation for patient's pain.  She does seem to have an element of chronic abdominal pain for which she follows with GI.  I have advised her to follow-up with GI and otherwise return to the ED for new worsening symptoms.  Patient agrees with plan.      ____________________________________________   FINAL CLINICAL IMPRESSION(S) / ED DIAGNOSES  Final diagnoses:  Right upper quadrant abdominal pain  Chronic nausea     ED Discharge Orders    None       Note:  This document was prepared using Dragon voice recognition software and may include unintentional dictation errors.   Blake Divine, MD 04/25/19 [phone]

## 2019-04-25 NOTE — ED Triage Notes (Signed)
Pt to ED via POV c/o right sided abd pain and increased gas. Pt states that she has x-ray done yesterday at her PCP officer that showed SBO. Pt is in NAD.

## 2019-04-27 ENCOUNTER — Encounter: Payer: Self-pay | Admitting: Family Medicine

## 2019-05-27 DIAGNOSIS — M961 Postlaminectomy syndrome, not elsewhere classified: Secondary | ICD-10-CM | POA: Diagnosis not present

## 2019-05-27 DIAGNOSIS — M62831 Muscle spasm of calf: Secondary | ICD-10-CM | POA: Diagnosis not present

## 2019-05-27 DIAGNOSIS — G894 Chronic pain syndrome: Secondary | ICD-10-CM | POA: Diagnosis not present

## 2019-05-27 DIAGNOSIS — Z79891 Long term (current) use of opiate analgesic: Secondary | ICD-10-CM | POA: Diagnosis not present

## 2019-06-11 ENCOUNTER — Other Ambulatory Visit: Payer: Self-pay | Admitting: Family Medicine

## 2019-06-11 MED ORDER — CLONAZEPAM 1 MG PO TABS: 1.0000 mg | ORAL_TABLET | Freq: Two times a day (BID) | ORAL | 0 refills | Status: DC | PRN

## 2019-06-11 NOTE — Telephone Encounter (Signed)
ERx 

## 2019-07-04 ENCOUNTER — Emergency Department: Payer: Medicare Other

## 2019-07-04 ENCOUNTER — Encounter: Payer: Self-pay | Admitting: Emergency Medicine

## 2019-07-04 ENCOUNTER — Emergency Department
Admission: EM | Admit: 2019-07-04 | Discharge: 2019-07-04 | Disposition: A | Discharge status: Home / Self Care | Payer: Medicare Other | Attending: Emergency Medicine | Admitting: Emergency Medicine

## 2019-07-04 ENCOUNTER — Other Ambulatory Visit: Payer: Self-pay

## 2019-07-04 DIAGNOSIS — E039 Hypothyroidism, unspecified: Secondary | ICD-10-CM | POA: Diagnosis not present

## 2019-07-04 DIAGNOSIS — R05 Cough: Secondary | ICD-10-CM | POA: Diagnosis not present

## 2019-07-04 DIAGNOSIS — B349 Viral infection, unspecified: Secondary | ICD-10-CM

## 2019-07-04 DIAGNOSIS — Z20822 Contact with and (suspected) exposure to covid-19: Secondary | ICD-10-CM | POA: Diagnosis not present

## 2019-07-04 DIAGNOSIS — Z79899 Other long term (current) drug therapy: Secondary | ICD-10-CM | POA: Insufficient documentation

## 2019-07-04 DIAGNOSIS — J9801 Acute bronchospasm: Secondary | ICD-10-CM | POA: Insufficient documentation

## 2019-07-04 DIAGNOSIS — R509 Fever, unspecified: Secondary | ICD-10-CM | POA: Diagnosis not present

## 2019-07-04 LAB — CBC WITH DIFFERENTIAL/PLATELET
Abs Immature Granulocytes: 0.01 10*3/uL (ref 0.00–0.07)
Basophils Absolute: 0 10*3/uL (ref 0.0–0.1)
Basophils Relative: 0 %
Eosinophils Absolute: 0 10*3/uL (ref 0.0–0.5)
Eosinophils Relative: 0 %
HCT: 42.3 % (ref 36.0–46.0)
Hemoglobin: 14.2 g/dL (ref 12.0–15.0)
Immature Granulocytes: 0 %
Lymphocytes Relative: 20 %
Lymphs Abs: 1 10*3/uL (ref 0.7–4.0)
MCH: 28.8 pg (ref 26.0–34.0)
MCHC: 33.6 g/dL (ref 30.0–36.0)
MCV: 85.8 fL (ref 80.0–100.0)
Monocytes Absolute: 0.5 10*3/uL (ref 0.1–1.0)
Monocytes Relative: 10 %
Neutro Abs: 3.4 10*3/uL (ref 1.7–7.7)
Neutrophils Relative %: 70 %
Platelets: 218 10*3/uL (ref 150–400)
RBC: 4.93 MIL/uL (ref 3.87–5.11)
RDW: 12.3 % (ref 11.5–15.5)
WBC: 4.8 10*3/uL (ref 4.0–10.5)
nRBC: 0 % (ref 0.0–0.2)

## 2019-07-04 LAB — BASIC METABOLIC PANEL
Anion gap: 12 (ref 5–15)
BUN: 10 mg/dL (ref 6–20)
CO2: 29 mmol/L (ref 22–32)
Calcium: 8.9 mg/dL (ref 8.9–10.3)
Chloride: 94 mmol/L — ABNORMAL LOW (ref 98–111)
Creatinine, Ser: 0.91 mg/dL (ref 0.44–1.00)
GFR calc Af Amer: >60 mL/min (ref >60)
GFR calc non Af Amer: >60 mL/min (ref >60)
Glucose, Bld: 113 mg/dL — ABNORMAL HIGH (ref 70–99)
Potassium: 4 mmol/L (ref 3.5–5.1)
Sodium: 135 mmol/L (ref 135–145)

## 2019-07-04 LAB — SARS CORONAVIRUS 2 BY RT PCR (HOSPITAL ORDER, PERFORMED IN ~~LOC~~ HOSPITAL LAB): SARS Coronavirus 2: NEGATIVE

## 2019-07-04 MED ORDER — HYDROCOD POLST-CPM POLST ER 10-8 MG/5ML PO SUER: 5.0000 mL | Freq: Two times a day (BID) | ORAL | 0 refills | Status: DC

## 2019-07-04 MED ORDER — ONDANSETRON HCL 4 MG/2ML IJ SOLN
4.0000 mg | Freq: Once | INTRAMUSCULAR | Status: AC
  Administered 2019-07-04: 4 mg via INTRAVENOUS
  Filled 2019-07-04: qty 2

## 2019-07-04 MED ORDER — METHYLPREDNISOLONE 4 MG PO TBPK
ORAL_TABLET | ORAL | 0 refills | Status: DC
Start: 2019-07-04 — End: 2019-08-26

## 2019-07-04 MED ORDER — ACETAMINOPHEN 325 MG PO TABS
650.0000 mg | ORAL_TABLET | Freq: Once | ORAL | Status: AC
  Administered 2019-07-04: 650 mg via ORAL
  Filled 2019-07-04: qty 2

## 2019-07-04 MED ORDER — KETOROLAC TROMETHAMINE 30 MG/ML IJ SOLN
30.0000 mg | Freq: Once | INTRAMUSCULAR | Status: AC
  Administered 2019-07-04: 30 mg via INTRAVENOUS
  Filled 2019-07-04: qty 1

## 2019-07-04 MED ORDER — HYDROCOD POLST-CPM POLST ER 10-8 MG/5ML PO SUER
5.0000 mL | Freq: Once | ORAL | Status: AC
  Administered 2019-07-04: 5 mL via ORAL
  Filled 2019-07-04: qty 5

## 2019-07-04 MED ORDER — SODIUM CHLORIDE 0.9 % IV BOLUS
1000.0000 mL | Freq: Once | INTRAVENOUS | Status: AC
  Administered 2019-07-04: 1000 mL via INTRAVENOUS

## 2019-07-04 NOTE — Discharge Instructions (Addendum)
Follow discharge care instruction take medication as directed.  Advised extra strength Tylenol as needed for fever.

## 2019-07-04 NOTE — ED Provider Notes (Signed)
[facility] Emergency Department Provider Note   ____________________________________________   First MD Initiated Contact with Patient 07/04/19 1035     (approximate)  I have reviewed the triage vital signs and the nursing notes.   HISTORY  Chief Complaint Cough and Nasal Congestion    HPI Karen Aguilar is a 54 y.o. female patient presents with body aches, nasal congestion, cough, and fever for 5 days.  Patient also complained of nausea and decreased appetite.  Patient denies recent travel or known contact with COVID-19.  Patient has not taken COVID-19 vaccine.  Patient test positive for COVID-19 in September 2020.         Past Medical History:  Diagnosis Date  . Abnormal MRI scan, bone 2012   abnormal marrow signal humeral head, glenoid and scapula - diffuse replacement of fatty marrow - likely benign, no need for further investigation ([place])  . Agnosia for temperature    Right Leg  . Anemia    etiology unknown-iron infusion in past,last 9/12  . Anxiety   . Anxiety and depression   . 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 Arthritis   . Chronic pain    pain contract with Dr. Eartha Inch Pain  . COVID-19 virus infection 09/2018  . DDD (degenerative disc disease), cervical   . DDD (degenerative disc disease), lumbar    s/p permanent nerve damage after back surgery  . Depression   . Depression with anxiety   . Diverticulosis   . Eczema   . Family history of adverse reaction to anesthesia    Mother - nausea  . Fibromyalgia   . Foot drop    right - numbness, tingling - twisting  . Gallstones   . GERD (gastroesophageal reflux disease)    rare zantac PRN N/V at times with this  . GERD (gastroesophageal reflux disease)   . Headache(784.0)    migraines  . Hemorrhoid   . Hemorrhoids   . History of kidney stones   . Hyperlipidemia   . Hypothyroidism 1990s   hashimoto's thyroiditis  . IBS (irritable bowel syndrome)   . IBS  (irritable bowel syndrome)   . Incontinence of urine    s/p sling procedure-unresolved  . Iron deficiency    h/o anemia, s/p iron infusion (2012)  . Liver hemangioma     x2  . Memory change    due to medications  . Migraines    and h/o optical migraine  . Migraines   . Mixed urge and stress incontinence 2008   extensive workup including urodynamics, failed miltiple anticholinergics including myrbetriq and urogesic blue s/p PTNS, normal cystoscopy 2014, seen by Dr Matilde Sprang and Dr Erlene Quan  . Neurogenic bladder 2008   after 13 spine surgeries with periph neuropathy and chronic LBP  . Neuropathy of lower extremity 2011  . Nocturia   . Notalgia paresthetica   . Osteoarthritis 12/2013  . PONV (postoperative nausea and vomiting)   . Pulmonary nodule 04/2012   7.58mm RLL nodule - resolved on imaging 12/2012  . Sleep apnea    mild-doesn't use cpap  . Urine frequency   . Vitamin D deficiency     Patient Active Problem List   Diagnosis Date Noted  . Hepatic hemangioma 09/01/2018  . Lower abdominal pain 04/14/2018  . Mass of chest wall, left 05/09/2017  . Epigastric abdominal pain 02/28/2017  . Non-intractable vomiting with nausea 02/27/2017  . Right leg pain 12/06/2016  . DDD (degenerative disc  disease), cervical 06/09/2016  . GAD (generalized anxiety disorder) 02/18/2016  . Right shoulder pain 02/18/2016  . Pelvic pressure in female 01/23/2016  . Post-menopausal 10/10/2015  . Urticaria 09/08/2015  . Neurogenic bladder   . Mixed urge and stress incontinence   . Chronic headache 09/22/2014  . Advanced care planning/counseling discussion 07/15/2014  . Health maintenance examination 07/15/2014  . Irritable bowel syndrome 02/23/2014  . Esophageal dysphagia 06/05/2013  . Chronic diarrhea 05/12/2013  . Chronic pelvic pain in female 05/05/2013  . Proctalgia fugax 05/05/2013  . Gastroparesis 05/04/2013  . Solitary pulmonary nodule 03/18/2013  . Shortness of breath 03/18/2013  .  RUQ abdominal pain 02/10/2013  . Unspecified constipation 02/10/2013  . Internal hemorrhoids 02/10/2013  . Hypothyroidism   . Fibromyalgia   . GERD (gastroesophageal reflux disease)   . Chronic pain syndrome   . Sleep apnea   . MDD (major depressive disorder), recurrent episode, moderate ([place])   . Arachnoiditis   . Hyperlipidemia   . Complicated migraine   . Vitamin D deficiency   . Iron deficiency   . DDD (degenerative disc disease), lumbar     Past Surgical History:  Procedure Laterality Date  . ANTERIOR CERVICAL DECOMP/DISCECTOMY FUSION     C3, 4, 5 - improved after surgery  . ANTERIOR CERVICAL DECOMP/DISCECTOMY FUSION  07/2017   C6/7 Dr Pamala Hurry  . BACK SURGERY     multiple-L4-s1,hardware,fusion,removal,infection s/p 5/11 procedure  . BACK SURGERY  02/16/11   "my 7th back OR; Procedure: Exploration of fusion removal of hardware L4-S1, harvesting of right iliac crest bone graft, redo posterior lateral fusion L4-5 using iliac crest bone graft BMP and master graft replacement of bilateral L4 screws with a Telfa tech 7.5 x 40 mm screw on the right and 8.5 x 40 mm screw on the left placement of a large Hemovac drain  . [employer]; 2001   x 2  . CHOLECYSTECTOMY  2001  . COLONOSCOPY  10/2016   rpt 10 yrs Gustavo Lah)  . COLONOSCOPY  03/2019   1 polyp, biopsies taken ([place])  . ESOPHAGEAL MANOMETRY  06/2017   WNL (Dr Earnie Larsson)  . ESOPHAGOGASTRODUODENOSCOPY  03/2013   early esophageal stricture dilated, o/w WNL Deatra Ina)  . ESOPHAGOGASTRODUODENOSCOPY  12/2014   WNL, s/p empiric dilation of esophagus Henrene Pastor)  . ESOPHAGOGASTRODUODENOSCOPY  10/2015   WNL, biopsies WNL as well (minimal esophagitis with reactive changes) Benson [place])  . ESOPHAGOGASTRODUODENOSCOPY  10/2016   biopsy suggestive of ulcer ([place])  . ESOPHAGOGASTRODUODENOSCOPY  03/2019   mild portal hypertensive gastropathy, gastric erosion without bleeding, duodenal diverticulum ([place])  . HEMORRHOID BANDING    .  LASIK Bilateral   . LUMBAR WOUND DEBRIDEMENT  03/15/2011   Procedure: LUMBAR WOUND DEBRIDEMENT;  Surgeon: Elaina Hoops, MD;  Lumbar Wound Debridement  . MANDIBLE RECONSTRUCTION  1986  . PFT  01/28/2013   WNL  . PUBOVAGINAL SLING  2008   midurethral sling  . SHOULDER CLOSED REDUCTION Left 12/18/2010   Procedure: CLOSED MANIPULATION SHOULDER;  Surgeon: Johnn Hai; labrial debridement  . SPINAL CORD STIMULATOR INSERTION N/A 08/19/2015   Procedure: LUMBAR SPINAL CORD STIMULATOR INSERTION;  Surgeon: Erline Levine, MD;  Location: [facility];  Service: Neurosurgery;  Laterality: N/A;  LUMBAR SPINAL CORD STIMULATOR INSERTION  . SPINE SURGERY    . TONSILLECTOMY  1972   as a child; "then they grew back"    Prior to Admission medications   Medication Sig Start Date End Date Taking? Authorizing Provider  Ascorbic Acid (VITAMIN C PO) Take 1 tablet by mouth as needed (for immune system support).     [provider]  baclofen (LIORESAL) 10 MG tablet Take 15 mg by mouth 3 (three) times daily. Takes 1 tablet daily    [provider]  calcium carbonate (TUMS SMOOTHIES) 750 MG chewable tablet Chew 2-3 tablets by mouth 2 (two) times daily.    [provider]  camphor-menthol Timoteo Ace) lotion Apply 1 application topically at bedtime as needed for itching.     [provider]  chlorpheniramine-HYDROcodone (TUSSIONEX PENNKINETIC ER) 10-8 MG/5ML SUER Take 5 mLs by mouth 2 (two) times daily. 07/04/19   Sable Feil, PA-C  Cholecalciferol (VITAMIN D) 2000 units CAPS Take 1 capsule (2,000 Units total) by mouth daily. 10/10/15   Ria Bush, MD  clobetasol ointment (TEMOVATE) 3.32 % Apply 1 application topically as needed (for eczema).     [provider]  clonazePAM (KLONOPIN) 1 MG tablet Take 1 tablet (1 mg total) by mouth 2 (two) times daily as needed for anxiety. 06/11/19   Ria Bush, MD  colesevelam [facility]) 625 MG tablet Take 2 tablets (1,250 mg total)  by mouth 2 (two) times daily with a meal. 03/25/19   Ria Bush, MD  diclofenac sodium (VOLTAREN) 1 % GEL APPLY THIN LAYER EXTERNALLY TO THE AFFECTED AREA UP TO FOUR TIMES DAILY AS NEEDED 12/31/17   Mcarthur Rossetti, MD  dronabinol (MARINOL) 2.5 MG capsule Take 2.5 mg by mouth daily as needed (for nausea and pain).  02/03/14   [provider]  hyoscyamine (LEVSIN SL) 0.125 MG SL tablet DISSOLVE 1 TABLET(0.125 MG) UNDER THE TONGUE EVERY 4 HOURS AS NEEDED 01/15/18   Ria Bush, MD  IRON PO Take 20 mg by mouth every other day.    [provider]  methylPREDNISolone (MEDROL DOSEPAK) 4 MG TBPK tablet Take Tapered dose as directed 07/04/19   Sable Feil, PA-C  metroNIDAZOLE (METROGEL) 0.75 % gel Apply 1 application topically 2 (two) times daily. Patient taking differently: Apply 1 application topically 2 (two) times daily. As needed 06/08/16   Ria Bush, MD  NON FORMULARY Place 1 drop under the tongue 3 (three) times daily. Hemp oil as needed    [provider]  NUCYNTA 100 MG TABS Take 1 tablet by mouth every 6 (six) hours. 05/23/17   [provider]  Phenylephrine-DM-GG-APAP (MUCINEX SINUS-MAX) 5-10-200-325 MG CAPS Take 2 tablets by mouth 2 (two) times daily as needed (for migraine).     [provider]  promethazine (PHENERGAN) 25 MG tablet Take 1 tablet (25 mg total) by mouth every 8 (eight) hours as needed for nausea or vomiting. 04/24/19   Ria Bush, MD  sucralfate (CARAFATE) 1 g tablet Take 1 tablet (1 g total) by mouth 2 (two) times daily before a meal. As needed 05/13/18   Ria Bush, MD  thyroid (ARMOUR) 60 MG tablet Take 60 mg by mouth daily before breakfast. Three days a week take extra tablet daily 10/10/15   Ria Bush, MD    Allergies Augmentin [amoxicillin-pot clavulanate], Fluconazole, Erythromycin, Influenza vaccines, Levothyroxine, Thimerosal, Tomato, Adhesive [tape], Azithromycin, Gabapentin,  Imitrex [sumatriptan], Lyrica [pregabalin], Macrodantin [nitrofurantoin], Macrolides and ketolides, Nickel, Sulfa antibiotics, Sulfasalazine, and Troleandomycin  Family History  Problem Relation Age of Onset  . Lung cancer Father 66        (smoker)  . Diabetes Brother   . Thyroid disease Mother        ?empty sella  .  Irritable bowel syndrome Mother   . Dysphagia Mother   . CAD Maternal Grandfather        MI  . Heart disease Maternal Grandfather   . Heart attack Maternal Grandfather   . Lung cancer Paternal Grandfather         (smoker)  . Colon cancer Paternal Grandmother   . Thyroid disease Cousin   . Cancer Other        unsure (ovarian/uterine)  . Breast cancer Maternal Aunt   . Alzheimer's disease Maternal Grandmother   . Diabetes Daughter   . Breast cancer Maternal Aunt     Social History Social History   Tobacco Use  . Smoking status: Never Smoker  . Smokeless tobacco: Never Used  Vaping Use  . Vaping Use: Never used  Substance Use Topics  . Alcohol use: Yes    Alcohol/week: 0.0 standard drinks    Comment: rarely drinks wine  . Drug use: No    Review of Systems Constitutional:  Fever/chills.  Body aches. Eyes: No visual changes. ENT: No sore throat.  Nasal congestion. Cardiovascular: Denies chest pain. Respiratory: Denies shortness of breath.  Productive cough. Gastrointestinal: No abdominal pain.  Nausea and vomiting.  No diarrhea.  No constipation. Genitourinary: Negative for dysuria. Musculoskeletal: Positive for  back pain. Skin: Negative for rash. Neurological: Negative for headaches, focal weakness or numbness. Psychiatric:  Anxiety and depression. Allergic/Immunilogical: See extensive allergy list. ____________________________________________   PHYSICAL EXAM:  VITAL SIGNS: ED Triage Vitals  Enc Vitals Group     BP 07/04/19 1023 103/90     Pulse Rate 07/04/19 1023 (!) 113     Resp 07/04/19 1023 18     Temp 07/04/19 1023 100.1 F (37.8 C)      Temp Source 07/04/19 1023 Oral     SpO2 07/04/19 1023 99 %     Weight 07/04/19 1024 170 lb (77.1 kg)     Height 07/04/19 1024 5\' 8"  (1.727 m)     Head Circumference --      Peak Flow --      Pain Score 07/04/19 1024 0     Pain Loc --      Pain Edu? --      Excl. in [place]? --     Constitutional: Alert and oriented.  Moderate distress.  Febrile.   Eyes: Conjunctivae are normal. PERRL. EOMI. Head: Atraumatic. Nose: Edematous nasal turbinates.. Mouth/Throat: Mucous membranes are moist.  Oropharynx non-erythematous.  Postnasal drainage. Neck: No stridor.  Hematological/Lymphatic/Immunilogical: No cervical lymphadenopathy. Cardiovascular: Tachycardic, regular rhythm. Grossly normal heart sounds.  Good peripheral circulation. Respiratory: Normal respiratory effort.  No retractions. Lungs mild rales. Gastrointestinal: Soft and nontender. No distention. No abdominal bruits. No CVA tenderness. Genitourinary: Deferred Musculoskeletal: No lower extremity tenderness nor edema.  No joint effusions. Neurologic:  Normal speech and language. No gross focal neurologic deficits are appreciated. No gait instability. Skin:  Skin is warm, dry and intact. No rash noted. Psychiatric: Mood and affect are normal. Speech and behavior are normal.  ____________________________________________   LABS (all labs ordered are listed, but only abnormal results are displayed)  Labs Reviewed  BASIC METABOLIC PANEL - Abnormal; Notable for the following components:      Result Value   Chloride 94 (*)    Glucose, Bld 113 (*)    All other components within normal limits  SARS CORONAVIRUS 2 BY RT PCR (HOSPITAL ORDER, [facility])  CBC WITH DIFFERENTIAL/PLATELET   ____________________________________________  EKG  ____________________________________________  RADIOLOGY  ED MD interpretation:    Official radiology report(s): DG Chest Portable 1 View  Result Date:  07/04/2019 CLINICAL DATA:  Fever and cough EXAM: PORTABLE CHEST 1 VIEW COMPARISON:  Chest radiograph dated 01/30/2013. FINDINGS: The heart size and mediastinal contours are within normal limits. Both lungs are clear. The visualized skeletal structures are unremarkable. A spinal stimulator lead overlies the thoracic spine. IMPRESSION: No active disease. Electronically Signed   By: Zerita Boers M.D.   On: 07/04/2019 11:15    ____________________________________________   PROCEDURES  Procedure(s) performed (including Critical Care):  Procedures   ____________________________________________   INITIAL IMPRESSION / ASSESSMENT AND PLAN / ED COURSE  As part of my medical decision making, I reviewed the following data within the [facility]     Patient presents with nasal congestion cough and fever.  Discussed lab results with patient which was negative for COVID-19.  Discussed x-ray results with patient.  Patient complaint physical exam consistent with viral illness.  Patient given discharge care instructions and advised take medication as directed.    DEMISHA NOKES was evaluated in Emergency Department on 07/04/2019 for the symptoms described in the history of present illness. She was evaluated in the context of the global COVID-19 pandemic, which necessitated consideration that the patient might be at risk for infection with the SARS-CoV-2 virus that causes COVID-19. Institutional protocols and algorithms that pertain to the evaluation of patients at risk for COVID-19 are in a state of rapid change based on information released by regulatory bodies including the CDC and federal and state organizations. These policies and algorithms were followed during the patient's care in the ED.        ____________________________________________   FINAL CLINICAL IMPRESSION(S) / ED DIAGNOSES  Final diagnoses:  Cough due to bronchospasm  Viral illness     ED Discharge Orders          Ordered    methylPREDNISolone (MEDROL DOSEPAK) 4 MG TBPK tablet     Discontinue  Reprint     07/04/19 1243    chlorpheniramine-HYDROcodone (TUSSIONEX PENNKINETIC ER) 10-8 MG/5ML SUER  2 times daily     Discontinue  Reprint     07/04/19 1243           Note:  This document was prepared using Dragon voice recognition software and may include unintentional dictation errors.    Sable Feil, PA-C 07/04/19 1256    Vanessa Freeland, MD 07/05/19 [phone]

## 2019-07-04 NOTE — ED Triage Notes (Signed)
Pt to ED via POV c/o cough, nasal congestion and pressure, chest congestion, and fever. Pt is in NAD at this time.

## 2019-07-07 DIAGNOSIS — R12 Heartburn: Secondary | ICD-10-CM | POA: Diagnosis not present

## 2019-07-07 DIAGNOSIS — K9049 Malabsorption due to intolerance, not elsewhere classified: Secondary | ICD-10-CM | POA: Diagnosis not present

## 2019-07-07 DIAGNOSIS — K529 Noninfective gastroenteritis and colitis, unspecified: Secondary | ICD-10-CM | POA: Diagnosis not present

## 2019-07-07 DIAGNOSIS — R1011 Right upper quadrant pain: Secondary | ICD-10-CM | POA: Diagnosis not present

## 2019-07-20 DIAGNOSIS — K529 Noninfective gastroenteritis and colitis, unspecified: Secondary | ICD-10-CM | POA: Diagnosis not present

## 2019-07-20 DIAGNOSIS — R12 Heartburn: Secondary | ICD-10-CM | POA: Diagnosis not present

## 2019-07-20 DIAGNOSIS — R1011 Right upper quadrant pain: Secondary | ICD-10-CM | POA: Diagnosis not present

## 2019-07-20 DIAGNOSIS — K9049 Malabsorption due to intolerance, not elsewhere classified: Secondary | ICD-10-CM | POA: Diagnosis not present

## 2019-07-29 ENCOUNTER — Other Ambulatory Visit: Payer: Self-pay | Admitting: Family Medicine

## 2019-07-29 DIAGNOSIS — M62831 Muscle spasm of calf: Secondary | ICD-10-CM | POA: Diagnosis not present

## 2019-07-29 DIAGNOSIS — M961 Postlaminectomy syndrome, not elsewhere classified: Secondary | ICD-10-CM | POA: Diagnosis not present

## 2019-07-29 DIAGNOSIS — Z79891 Long term (current) use of opiate analgesic: Secondary | ICD-10-CM | POA: Diagnosis not present

## 2019-07-29 DIAGNOSIS — G894 Chronic pain syndrome: Secondary | ICD-10-CM | POA: Diagnosis not present

## 2019-07-29 NOTE — Telephone Encounter (Signed)
Refill request Clonazepam Last refill 06/11/19 #60 Last office visit 04/24/19 video visit

## 2019-07-29 NOTE — Telephone Encounter (Signed)
Already refilled

## 2019-07-29 NOTE — Telephone Encounter (Signed)
ERx 

## 2019-08-07 DIAGNOSIS — H04123 Dry eye syndrome of bilateral lacrimal glands: Secondary | ICD-10-CM | POA: Diagnosis not present

## 2019-08-17 ENCOUNTER — Telehealth: Payer: Self-pay

## 2019-08-17 NOTE — Telephone Encounter (Signed)
[facility] - Client TELEPHONE ADVICE RECORD [facility] Patient Name: Karen Aguilar Gender: Female DOB: 17-May-1965 Age: 54 Y 63 M 15 D Return Phone Number: [phone] (Primary) Address: City/State/Zip: [place] [address] Client [facility] - Client Client Site [facility] - Day Physician Ria Bush - MD Contact Type Call Who Is Calling Patient / Member / Family / Caregiver Call Type Triage / Clinical Relationship To Patient Self Return Phone Number [phone] (Primary) Chief Complaint Headache Reason for Call Symptomatic / Request for [place] has a migraine, that is abnormal than her usual migraine. Her whole left side of her face hurt. She has had vision loss in her left eye. Eye Dr. said that the cornea is damaged and doesn't know if it is nerve or dry eye (they are treating her for now). She has smell sensitivity. Translation No Nurse Assessment Nurse: Ronnald Ramp, RN, Olivia Mackie Date/Time (Eastern Time): 08/17/2019 12:33:41 PM Confirm and document reason for call. If symptomatic, describe symptoms. ---Caller's has a migraine, that is abnormal than her usual migraine. Her whole left side of her face hurt. She has had vision loss in her left eye. Eye Dr. said that the cornea is damaged and doesn't know if it is nerve or dry eye (they are treating her for now). She has smell sensitivity. Nauseated. Has the patient had close contact with a person known or suspected to have the novel coronavirus illness OR traveled / lives in area with major community spread (including international travel) in the last 14 days from the onset of symptoms? * If Asymptomatic, screen for exposure and travel within the last 14 days. ---No Does the patient have any new or worsening symptoms? ---Yes Will a triage be completed? ---Yes Related visit to physician within the last 2 weeks?  ---No Does the PT have any chronic conditions? (i.e. diabetes, asthma, this includes High risk factors for pregnancy, etc.) ---No Is the patient pregnant or possibly pregnant? (Ask all females between the ages of 72-55) ---No Is this a behavioral health or substance abuse call? ---No PLEASE NOTE: All timestamps contained within this report are represented as [place] Standard Time. CONFIDENTIALTY NOTICE: This fax transmission is intended only for the addressee. It contains information that is legally privileged, confidential or otherwise protected from use or disclosure. If you are not the intended recipient, you are strictly prohibited from reviewing, disclosing, copying using or disseminating any of this information or taking any action in reliance on or regarding this information. If you have received this fax in error, please notify us immediately by telephone so that we can arrange for its return to [place]. Phone: [phone], Toll-Free: [phone], Fax: [phone] Page: 2 of 2 Call Id: [id number] Guidelines Guideline Title Affirmed Question Affirmed Notes Nurse Date/Time Eilene Ghazi Time) Headache Loss of vision or double vision (Exception: same as prior migraines) Ronnald Ramp, RN, Olivia Mackie 08/17/2019 12:34:17 PM Disp. Time Eilene Ghazi Time) Disposition Final User 08/17/2019 12:41:23 PM Go to ED Now (or PCP triage) Yes Ronnald Ramp, RN, [place] Rm Disagree/Comply Disagree Caller Understands Yes PreDisposition Call Doctor Care Advice Given Per Guideline GO TO ED NOW (OR PCP TRIAGE): ANOTHER ADULT SHOULD DRIVE: * It is better and safer if another adult drives instead of you. CARE ADVICE given per Headache (Adult) guideline. Comments User: Gifford Shave, RN Date/Time (Eastern Time): 08/17/2019 12:45:55 PM Called and spoke to [place] in the office, relayed pt's information and disposition and pt's reluctance and  wanting to be seen in the office. Raquel Sarna stated she'd pass this info along to one of the nurses  there to follow up on. Referrals GO TO FACILITY UNDECIDED

## 2019-08-17 NOTE — Telephone Encounter (Signed)
Pt c/o severe L side of head migraine yesterday with vision loss". Pt reports today it is more of a bad headache. Pt reports she has hx of migraines but normally they are not on one side of her head. Pt also reports she has been going to ophthalmology for one year concerning "vision loss" which she reports later as being progressively worsening blurred vision. Pt has never lost complete vision. Only had blurred vision and it is not coincided with migraines.  Pt denies any severe headache or migraine at this time. Advised pt she should have f/u apt with PCP to discuss the migraines she is having and see if referral may be needed and if any symptoms worsen or she needs anything before the apt to contact the office. Pt verbalized understanding.  Scheduled pt for apt with PCP on 8/25

## 2019-08-20 NOTE — Telephone Encounter (Signed)
Noted. Will see then.  

## 2019-08-26 ENCOUNTER — Ambulatory Visit: Payer: Medicare Other | Admitting: Family Medicine

## 2019-08-26 ENCOUNTER — Other Ambulatory Visit: Payer: Self-pay

## 2019-08-26 ENCOUNTER — Encounter: Payer: Self-pay | Admitting: Family Medicine

## 2019-08-26 VITALS — BP 122/72 | HR 99 | Temp 98.3°F | Ht 68.0 in | Wt 167.4 lb

## 2019-08-26 DIAGNOSIS — G43109 Migraine with aura, not intractable, without status migrainosus: Secondary | ICD-10-CM | POA: Diagnosis not present

## 2019-08-26 DIAGNOSIS — R1011 Right upper quadrant pain: Secondary | ICD-10-CM

## 2019-08-26 DIAGNOSIS — R200 Anesthesia of skin: Secondary | ICD-10-CM | POA: Diagnosis not present

## 2019-08-26 DIAGNOSIS — G43009 Migraine without aura, not intractable, without status migrainosus: Secondary | ICD-10-CM

## 2019-08-26 LAB — POC URINALSYSI DIPSTICK (AUTOMATED)
Blood, UA: NEGATIVE
Glucose, UA: NEGATIVE
Ketones, UA: NEGATIVE
Leukocytes, UA: NEGATIVE
Nitrite, UA: NEGATIVE
Protein, UA: NEGATIVE
Spec Grav, UA: >=1.03 — AB (ref 1.010–1.025)
Urobilinogen, UA: 2 E.U./dL — AB
pH, UA: 6 (ref 5.0–8.0)

## 2019-08-26 MED ORDER — TOPIRAMATE 50 MG PO TABS
50.0000 mg | ORAL_TABLET | Freq: Every day | ORAL | 3 refills | Status: DC
Start: 2019-08-26 — End: 2020-02-09

## 2019-08-26 NOTE — Progress Notes (Signed)
This visit was conducted in person.  BP 122/72 (BP Location: Left Arm, Patient Position: Sitting, Cuff Size: Normal)   Pulse 99   Temp 98.3 F (36.8 C) (Temporal)   Ht _0  (1.727 m)   Wt 167 lb 7 oz (75.9 kg)   LMP 10/04/2014 Comment: per labs  SpO2 97%   BMI 25.46 kg/m    CC: migraines and abdominal pain Subjective:    Patient ID: Karen Aguilar, female    DOB: 11-Jun-1965, 54 y.o.   MRN: [id number]  HPI: Karen Aguilar is a 54 y.o. female presenting on 08/26/2019 for Migraine (C/o migraine, worsening.  Also, c/o left side of face and teeth.) and Abdominal Pain (C/o RUQ abd pain, off and on.  Also, c/o nausea and bloating.  Sxs started about 2 wks ago. Denies any vomiting or diarrhea. )   Several week history of worsening migraines associated with L facial pain, tooth pain, peeling bottom lip (using neosporin night time lip repair with benefit) and blurry left eye vision. Describes L hemicranial pain - with nausea (no vomiting), photo/phonophobia, activity limiting. Manages nausea with phenergan. Somewhat different to prior migraines. Decreased sensation to L face for years. She was managed on topamax 157m bid with benefit, recently tapered off due to longterm stability and paresthesia side effect.  H/o complicated migraine with optical migraine.  Cambia previously too expensive. Avoiding NSAIDs in general due to GI symptoms. She feels flexeril worsened migraines. Poor tolerance to triptans.   [facility] [place] of L eye - told had some corneal damage ?dry eye or nerve damage related although optic nerve looked ok. Has f/u appt in a few weeks for this.  Saw dentist - normal tooth exam.    Several weeks of sharp stabbing RLQ abd pain lasting 20-30 min. Ongoing nausea, bloating, gassiness over the last several days, decreased appetite. Previous diarrhea has resolved after she slowly tapered herself off wellchol. Bowel movements are  slowed but formed. She stopped welchol due to worsening constipation/GI upset while on it - as well as partial SBO.  Just drinking small sips of water, greek yogurt, toast.   No fevers/chills, vomiting, diarrhea, dysuria, hematuria, vaginal bleeding or discharge.   Sees [facility] for food intolerance and chronic diarrhea - last saw 07/07/2019 for telemedicine visit, note reviewed - welchol may have caused worsening constipation but off of it diarrhea has returned - seemed to do ok with 1/2 tab welchol BID (just some nausea) but decided to wean off completely. GI testing so far reassuringly normal - esophageal function testing, EGD, colonoscopy with biopsies. Underwent food testing - unrvealing. At risk for SIBO. Started on higher pepcid 413mdosing. Planned f/u in 3 months.   S/p cholecystectomy.  LMP - years ago.      Relevant past medical, surgical, family and social history reviewed and updated as indicated. Interim medical history since our last visit reviewed. Allergies and medications reviewed and updated. Outpatient Medications Prior to Visit  Medication Sig Dispense Refill  . Ascorbic Acid (VITAMIN C PO) Take 1 tablet by mouth as needed (for immune system support).     . baclofen (LIORESAL) 10 MG tablet Take 1 tablet (10 mg total) by mouth 3 (three) times daily.    . calcium carbonate (TUMS SMOOTHIES) 750 MG chewable tablet Chew 2-3 tablets by mouth 2 (two) times daily.    . camphor-menthol (SARNA) lotion Apply 1 application topically at  bedtime as needed for itching.     . clobetasol ointment (TEMOVATE) 1.61 % Apply 1 application topically as needed (for eczema).     . clonazePAM (KLONOPIN) 1 MG tablet TAKE 1 TABLET BY MOUTH TWICE DAILY AS NEEDED ANXIETY 60 tablet 0  . diclofenac sodium (VOLTAREN) 1 % GEL APPLY THIN LAYER EXTERNALLY TO THE AFFECTED AREA UP TO FOUR TIMES DAILY AS NEEDED 300 g 0  . dronabinol (MARINOL) 2.5 MG capsule Take 2.5 mg by mouth daily as needed (for nausea and  pain).   0  . hyoscyamine (LEVSIN SL) 0.125 MG SL tablet DISSOLVE 1 TABLET(0.125 MG) UNDER THE TONGUE EVERY 4 HOURS AS NEEDED 30 tablet 3  . IRON PO Take 20 mg by mouth every other day.    . metroNIDAZOLE (METROGEL) 0.75 % gel Apply 1 application topically 2 (two) times daily. (Patient taking differently: Apply 1 application topically 2 (two) times daily. As needed) 45 g 3  . NUCYNTA 100 MG TABS Take 1 tablet by mouth every 6 (six) hours.  0  . Phenylephrine-DM-GG-APAP (MUCINEX SINUS-MAX) 5-10-200-325 MG CAPS Take 2 tablets by mouth 2 (two) times daily as needed (for migraine).     . promethazine (PHENERGAN) 25 MG tablet Take 1 tablet (25 mg total) by mouth every 8 (eight) hours as needed for nausea or vomiting. 30 tablet 0  . sucralfate (CARAFATE) 1 g tablet Take 1 tablet (1 g total) by mouth 2 (two) times daily before a meal. As needed 120 tablet 0  . thyroid (ARMOUR) 60 MG tablet Take 60 mg by mouth daily before breakfast. Three days a week take extra tablet daily    . baclofen (LIORESAL) 10 MG tablet Take 15 mg by mouth 3 (three) times daily. Takes 1 tablet daily    . Cholecalciferol (VITAMIN D) 2000 units CAPS Take 1 capsule (2,000 Units total) by mouth daily. (Patient not taking: Reported on 08/26/2019) 30 capsule   . NON FORMULARY Place 1 drop under the tongue 3 (three) times daily. Hemp oil as needed (Patient not taking: Reported on 08/26/2019)    . chlorpheniramine-HYDROcodone (TUSSIONEX PENNKINETIC ER) 10-8 MG/5ML SUER Take 5 mLs by mouth 2 (two) times daily. 115 mL 0  . colesevelam (WELCHOL) 625 MG tablet Take 2 tablets (1,250 mg total) by mouth 2 (two) times daily with a meal. 120 tablet 6  . methylPREDNISolone (MEDROL DOSEPAK) 4 MG TBPK tablet Take Tapered dose as directed 21 tablet 0   No facility-administered medications prior to visit.     Per HPI unless specifically indicated in ROS section below Review of Systems Objective:  BP 122/72 (BP Location: Left Arm, Patient Position:  Sitting, Cuff Size: Normal)   Pulse 99   Temp 98.3 F (36.8 C) (Temporal)   Ht _0  (1.727 m)   Wt 167 lb 7 oz (75.9 kg)   LMP 10/04/2014 Comment: per labs  SpO2 97%   BMI 25.46 kg/m   Wt Readings from Last 3 Encounters:  08/26/19 167 lb 7 oz (75.9 kg)  07/04/19 170 lb (77.1 kg)  04/24/19 171 lb (77.6 kg)      Physical Exam Vitals and nursing note reviewed.  Constitutional:      General: She is not in acute distress.    Appearance: Normal appearance. She is well-developed. She is not ill-appearing.  HENT:     Head: Normocephalic and atraumatic.     Comments: No significant pain at temporal arteries bilaterally, strong pulses     Right  Ear: Hearing, tympanic membrane, ear canal and external ear normal.     Left Ear: Hearing, tympanic membrane, ear canal and external ear normal.     Nose: Nose normal. No mucosal edema, congestion or rhinorrhea.     Right Sinus: No maxillary sinus tenderness or frontal sinus tenderness.     Left Sinus: No maxillary sinus tenderness or frontal sinus tenderness.     Mouth/Throat:     Mouth: Mucous membranes are moist.     Pharynx: Oropharynx is clear. Uvula midline. No oropharyngeal exudate or posterior oropharyngeal erythema.     Tonsils: No tonsillar abscesses.  Eyes:     General: No scleral icterus.    Conjunctiva/sclera: Conjunctivae normal.     Pupils: Pupils are equal, round, and reactive to light.  Cardiovascular:     Rate and Rhythm: Normal rate and regular rhythm.     Pulses: Normal pulses.     Heart sounds: Normal heart sounds. No murmur heard.   Pulmonary:     Effort: Pulmonary effort is normal. No respiratory distress.     Breath sounds: Normal breath sounds. No wheezing, rhonchi or rales.  Abdominal:     General: Abdomen is flat. Bowel sounds are normal. There is no distension.     Palpations: Abdomen is soft. There is no mass.     Tenderness: There is generalized abdominal tenderness (mild) and tenderness in the right upper  quadrant, epigastric area and suprapubic area. There is no guarding or rebound. Negative signs include Murphy's sign.     Hernia: No hernia is present.  Musculoskeletal:     Cervical back: Normal range of motion and neck supple.  Lymphadenopathy:     Cervical: No cervical adenopathy.  Skin:    General: Skin is warm and dry.     Findings: No rash.  Neurological:     General: No focal deficit present.     Mental Status: She is alert.     Cranial Nerves: Cranial nerve deficit present. No dysarthria or facial asymmetry.     Sensory: Sensory deficit present.     Motor: Motor function is intact.     Coordination: Coordination is intact.     Gait: Gait is intact. Gait normal.     Comments:  L facial numbness along all compartments of L trigeminal nerve without weakness. CN 2-12 otherwise intact.  FTN intact  Psychiatric:        Mood and Affect: Mood normal.        Behavior: Behavior normal.       Results for orders placed or performed in visit on 08/26/19  Comprehensive metabolic panel  Result Value Ref Range   Sodium 139 135 - 145 mEq/L   Potassium 4.3 3.5 - 5.1 mEq/L   Chloride 100 96 - 112 mEq/L   CO2 32 19 - 32 mEq/L   Glucose, Bld 117 (H) 70 - 99 mg/dL   BUN 11 6 - 23 mg/dL   Creatinine, Ser 0.76 0.40 - 1.20 mg/dL   Total Bilirubin 0.5 0.2 - 1.2 mg/dL   Alkaline Phosphatase 169 (H) 39 - 117 U/L   AST 126 (H) 0 - 37 U/L   ALT 71 (H) 0 - 35 U/L   Total Protein 7.1 6.0 - 8.3 g/dL   Albumin 4.0 3.5 - 5.2 g/dL   GFR 79.51 >60.00 mL/min   Calcium 9.4 8.4 - 10.5 mg/dL  CBC with Differential/Platelet  Result Value Ref Range   WBC 3.1 (L) 4.0 -  10.5 K/uL   RBC 4.73 3.87 - 5.11 Mil/uL   Hemoglobin 13.9 12.0 - 15.0 g/dL   HCT 40.3 36 - 46 %   MCV 85.2 78.0 - 100.0 fl   MCHC 34.4 30.0 - 36.0 g/dL   RDW 12.9 11.5 - 15.5 %   Platelets 234.0 150 - 400 K/uL   Neutrophils Relative % 66.6 43 - 77 %   Lymphocytes Relative 21.0 12 - 46 %   Monocytes Relative 12.0 3 - 12 %    Eosinophils Relative 0.0 0 - 5 %   Basophils Relative 0.4 0 - 3 %   Neutro Abs 2.1 1.4 - 7.7 K/uL   Lymphs Abs 0.6 (L) 0.7 - 4.0 K/uL   Monocytes Absolute 0.4 0 - 1 K/uL   Eosinophils Absolute 0.0 0 - 0 K/uL   Basophils Absolute 0.0 0 - 0 K/uL  Sedimentation rate  Result Value Ref Range   Sed Rate 30 0 - 30 mm/hr  High sensitivity CRP  Result Value Ref Range   CRP, High Sensitivity 7.460 (H) 0.000 - 5.000 mg/L  POCT Urinalysis Dipstick (Automated)  Result Value Ref Range   Color, UA yellow    Clarity, UA clear    Glucose, UA Negative Negative   Bilirubin, UA 1+    Ketones, UA negative    Spec Grav, UA >=1.030 (A) 1.010 - 1.025   Blood, UA negative    pH, UA 6.0 5.0 - 8.0   Protein, UA Negative Negative   Urobilinogen, UA 2.0 (A) 0.2 or 1.0 E.U./dL   Nitrite, UA negative    Leukocytes, UA Negative Negative   Assessment & Plan:  This visit occurred during the SARS-CoV-2 public health emergency.  Safety protocols were in place, including screening questions prior to the visit, additional usage of staff PPE, and extensive cleaning of exam room while observing appropriate contact time as indicated for disinfecting solutions.   Problem List Items Addressed This Visit    RUQ abdominal pain    Ongoing of unclear cause. Check labwork. Sees [facility], f/u planned later this year. Overall reassuring eval by [facility] including esophageal function test, EGD and colonoscopy. Also had unrevealing food allergy testing.      Relevant Orders   POCT Urinalysis Dipstick (Automated) (Completed)   Comprehensive metabolic panel (Completed)   CBC with Differential/Platelet (Completed)   Left facial numbness    Chronic s/p normal MRI 2018, saw neuro      Complicated migraine - Primary    H/o this as well as optical migraine, suspect recurrence. Overall reassuring neurological exam except for chronic L facial numbness.  Restart topamax 77m nightly with option to increase, monitoring for paresthesias.   Reviewed prior brain MRI 2018 WNL.  Discussed option of return to neuro/[facility] if not improving with this. [facility] well as [facility] Dr JTomi Likens  Given predominantly L sided temporal headache, check ESR, CRP to evaluate for temporal arteritis as possible cause.      Relevant Medications   topiramate (TOPAMAX) 50 MG tablet   baclofen (LIORESAL) 10 MG tablet    Other Visit Diagnoses    Migraine without aura and without status migrainosus, not intractable       Relevant Medications   topiramate (TOPAMAX) 50 MG tablet   baclofen (LIORESAL) 10 MG tablet   Other Relevant Orders   Sedimentation rate (Completed)   High sensitivity CRP (Completed)       Meds ordered this  encounter  Medications  . topiramate (TOPAMAX) 50 MG tablet    Sig: Take 1 tablet (50 mg total) by mouth at bedtime.    Dispense:  30 tablet    Refill:  3   Orders Placed This Encounter  Procedures  . Comprehensive metabolic panel  . CBC with Differential/Platelet  . Sedimentation rate  . High sensitivity CRP  . POCT Urinalysis Dipstick (Automated)    Patient Instructions  Restart topamax for possible complicated migraines - 53RT at night time.  Labs today to rule out other cause of headache.  Labs today to check for cause of abdominal pain. Urine today didn't show blood or signs of infection.  We may refer you to neurology pending lab results.    Follow up plan: Return if symptoms worsen or fail to improve.  Ria Bush, MD

## 2019-08-26 NOTE — Patient Instructions (Addendum)
Restart topamax for possible complicated migraines - 50mg  at night time.  Labs today to rule out other cause of headache.  Labs today to check for cause of abdominal pain. Urine today didn't show blood or signs of infection.  We may refer you to neurology pending lab results.

## 2019-08-27 ENCOUNTER — Encounter: Payer: Self-pay | Admitting: Family Medicine

## 2019-08-27 ENCOUNTER — Other Ambulatory Visit: Payer: Self-pay | Admitting: Family Medicine

## 2019-08-27 DIAGNOSIS — R519 Headache, unspecified: Secondary | ICD-10-CM

## 2019-08-27 DIAGNOSIS — R7982 Elevated C-reactive protein (CRP): Secondary | ICD-10-CM

## 2019-08-27 DIAGNOSIS — R7401 Elevation of levels of liver transaminase levels: Secondary | ICD-10-CM

## 2019-08-27 DIAGNOSIS — R200 Anesthesia of skin: Secondary | ICD-10-CM | POA: Insufficient documentation

## 2019-08-27 LAB — CBC WITH DIFFERENTIAL/PLATELET
Basophils Absolute: 0 10*3/uL (ref 0.0–0.1)
Basophils Relative: 0.4 % (ref 0.0–3.0)
Eosinophils Absolute: 0 10*3/uL (ref 0.0–0.7)
Eosinophils Relative: 0 % (ref 0.0–5.0)
HCT: 40.3 % (ref 36.0–46.0)
Hemoglobin: 13.9 g/dL (ref 12.0–15.0)
Lymphocytes Relative: 21 % (ref 12.0–46.0)
Lymphs Abs: 0.6 10*3/uL — ABNORMAL LOW (ref 0.7–4.0)
MCHC: 34.4 g/dL (ref 30.0–36.0)
MCV: 85.2 fl (ref 78.0–100.0)
Monocytes Absolute: 0.4 10*3/uL (ref 0.1–1.0)
Monocytes Relative: 12 % (ref 3.0–12.0)
Neutro Abs: 2.1 10*3/uL (ref 1.4–7.7)
Neutrophils Relative %: 66.6 % (ref 43.0–77.0)
Platelets: 234 10*3/uL (ref 150.0–400.0)
RBC: 4.73 Mil/uL (ref 3.87–5.11)
RDW: 12.9 % (ref 11.5–15.5)
WBC: 3.1 10*3/uL — ABNORMAL LOW (ref 4.0–10.5)

## 2019-08-27 LAB — COMPREHENSIVE METABOLIC PANEL
ALT: 71 U/L — ABNORMAL HIGH (ref 0–35)
AST: 126 U/L — ABNORMAL HIGH (ref 0–37)
Albumin: 4 g/dL (ref 3.5–5.2)
Alkaline Phosphatase: 169 U/L — ABNORMAL HIGH (ref 39–117)
BUN: 11 mg/dL (ref 6–23)
CO2: 32 mEq/L (ref 19–32)
Calcium: 9.4 mg/dL (ref 8.4–10.5)
Chloride: 100 mEq/L (ref 96–112)
Creatinine, Ser: 0.76 mg/dL (ref 0.40–1.20)
GFR: 79.51 mL/min (ref >60.00)
Glucose, Bld: 117 mg/dL — ABNORMAL HIGH (ref 70–99)
Potassium: 4.3 mEq/L (ref 3.5–5.1)
Sodium: 139 mEq/L (ref 135–145)
Total Bilirubin: 0.5 mg/dL (ref 0.2–1.2)
Total Protein: 7.1 g/dL (ref 6.0–8.3)

## 2019-08-27 LAB — HIGH SENSITIVITY CRP: CRP, High Sensitivity: 7.46 mg/L — ABNORMAL HIGH (ref 0.000–5.000)

## 2019-08-27 LAB — SEDIMENTATION RATE: Sed Rate: 30 mm/hr (ref 0–30)

## 2019-08-27 NOTE — Assessment & Plan Note (Signed)
Ongoing of unclear cause. Check labwork. Sees [facility], f/u planned later this year. Overall reassuring eval by [facility] including esophageal function test, EGD and colonoscopy. Also had unrevealing food allergy testing.

## 2019-08-27 NOTE — Assessment & Plan Note (Addendum)
H/o this as well as optical migraine, suspect recurrence. Overall reassuring neurological exam except for chronic L facial numbness.  Restart topamax 73m nightly with option to increase, monitoring for paresthesias.  Reviewed prior brain MRI 2018 WNL.  Discussed option of return to neuro/[facility] if not improving with this. [facility] well as [facility] Dr JTomi Likens  Given predominantly L sided temporal headache, check ESR, CRP to evaluate for temporal arteritis as possible cause.

## 2019-08-27 NOTE — Assessment & Plan Note (Signed)
Chronic s/p normal MRI 2018, saw neuro

## 2019-08-28 ENCOUNTER — Encounter: Payer: Self-pay | Admitting: Family Medicine

## 2019-08-28 DIAGNOSIS — R519 Headache, unspecified: Secondary | ICD-10-CM

## 2019-08-28 DIAGNOSIS — G8929 Other chronic pain: Secondary | ICD-10-CM

## 2019-08-28 MED ORDER — PREDNISONE 20 MG PO TABS
60.0000 mg | ORAL_TABLET | Freq: Every day | ORAL | 0 refills | Status: DC
Start: 2019-08-28 — End: 2020-04-13

## 2019-08-28 NOTE — Telephone Encounter (Signed)
Pt left VM at Triage with the same information she put in her mychart message. She did also request Dr. Darnell Level pt an order in for a temporal biopsy as well she said Rheum request Dr. Darnell Level put the order in   CB# [phone]

## 2019-08-31 DIAGNOSIS — R519 Headache, unspecified: Secondary | ICD-10-CM | POA: Diagnosis not present

## 2019-08-31 DIAGNOSIS — G43109 Migraine with aura, not intractable, without status migrainosus: Secondary | ICD-10-CM | POA: Diagnosis not present

## 2019-08-31 DIAGNOSIS — M5030 Other cervical disc degeneration, unspecified cervical region: Secondary | ICD-10-CM | POA: Diagnosis not present

## 2019-08-31 DIAGNOSIS — R7982 Elevated C-reactive protein (CRP): Secondary | ICD-10-CM | POA: Diagnosis not present

## 2019-08-31 NOTE — Addendum Note (Signed)
Addended by: Ria Bush on: 08/31/2019 02:17 PM   Modules accepted: Orders

## 2019-08-31 NOTE — Telephone Encounter (Signed)
fyi cancelling [facility] referral as rheum was able to get pt in this week with [facility].

## 2019-09-01 ENCOUNTER — Telehealth: Payer: Self-pay | Admitting: Family Medicine

## 2019-09-01 ENCOUNTER — Other Ambulatory Visit
Admission: RE | Admit: 2019-09-01 | Discharge: 2019-09-01 | Disposition: A | Discharge status: Home / Self Care | Payer: Medicare Other | Source: Ambulatory Visit | Attending: General Surgery | Admitting: General Surgery

## 2019-09-01 ENCOUNTER — Ambulatory Visit: Payer: Self-pay | Admitting: General Surgery

## 2019-09-01 ENCOUNTER — Other Ambulatory Visit: Payer: Self-pay

## 2019-09-01 DIAGNOSIS — Z20822 Contact with and (suspected) exposure to covid-19: Secondary | ICD-10-CM | POA: Diagnosis not present

## 2019-09-01 DIAGNOSIS — Z01812 Encounter for preprocedural laboratory examination: Secondary | ICD-10-CM | POA: Diagnosis not present

## 2019-09-01 DIAGNOSIS — R519 Headache, unspecified: Secondary | ICD-10-CM | POA: Diagnosis not present

## 2019-09-01 LAB — SARS CORONAVIRUS 2 (TAT 6-24 HRS): SARS Coronavirus 2: NEGATIVE

## 2019-09-01 NOTE — H&P (Signed)
PATIENT PROFILE: Karen Aguilar is a 54 y.o. female who presents to the Clinic for consultation at the request of Dr. Posey Pronto for evaluation of left temporal artery biopsy.  PCP:  Ria Bush, MD  HISTORY OF PRESENT ILLNESS: Karen Aguilar reports having headaches since few months ago.  She reported that it is mostly on the left side of the head.  Pain is concentrated on the temporal area.  Sometimes pain radiates to the face and neck.  She does report worsening blurry vision on the left side more than the right side.  There has been no alleviating or aggravating factors.  Patient has been evaluated by rheumatologist.  Due to elevation of CRP and the symptoms of this patient, temporal artery was he was recommended.   PROBLEM LIST:        Problem List  Date Reviewed: 08/31/2019       Noted   Neck pain 07/18/2017   Hyperlipidemia 07/18/2017   Overview    Last Assessment & Plan:  Chronic. Reviewed diet changes to improve lipid levels.      MDD (major depressive disorder), recurrent episode, moderate (CMS-HCC) 07/18/2017   Overview    Last Assessment & Plan:  Stable period off antidepressant. Continues klonopin 0.92m bid.      GERD (gastroesophageal reflux disease) 07/16/2017   Overview    rare zantac PRN  Last Assessment & Plan:  Stable period off dexilant, only using zantac and carafate PRN.      Complicated migraine 71/28/7867  Overview    and h/o optical migraine  Last Assessment & Plan:  Reviewed current regimen. Cambia was too expensive. Considering plain diclofenac - but caution in h/o GERD and possible gastroparesis      Hypothyroidism 07/16/2017   Overview    Last Assessment & Plan:  Stable period. Followed by endo Dr SGabriel Carina Has f/u appt next month.      Neurogenic bladder 07/16/2017   Overview    after 13 spine surgeries with periph neuropathy and chronic LBP  Last Assessment & Plan:  Planned f/u with neuro-urologist       Chronic pain syndrome 07/16/2017   Overview    Pain managed by Dr PHardin Negusof [facility]. Chronic pain stems from DDD s/p several surgeries and resulting arachnoiditis  Last Assessment & Plan:  Continue f/u with pain clinic (Hardin Negus      Fibromyalgia 07/16/2017   Overview    Last Assessment & Plan:  Ongoing struggle.      Sleep apnea 07/16/2017   Overview    mild-doesn't use cpap      Mass of chest wall, left 05/09/2017   Overview    Last Assessment & Plan:  This is likely a lipoma or sebaceous cyst Reassured--nothing to indicate cancer or anything serious Can refer to surgeon for excision if bothers her      DDD (degenerative disc disease), cervical 06/09/2016   Overview    MRI 03/2016 - stable disease, s/p ACDF C4/5 C5/6, chronic R eccentric disc degeneration at C7-T1, mild R C4, mod R C7, mod-severe R C8 neural foraminal stenosis appears stable      GAD (generalized anxiety disorder) 02/18/2016   Overview    Last Assessment & Plan:  Acute worsening, ?panic attack contributing to some symptoms. I did suggest try higher klonopin dose next few days (2 tab BID PRN instead of 1) and update with effect.      Hypothyroidism due to Hashimoto's thyroiditis Unknown   Irritable bowel syndrome  02/23/2014   Overview    Last Assessment & Plan:  Continue levsin PRN      Esophageal dysphagia 06/05/2013   Overview    Last Assessment & Plan:  Has established with Dr Benson Norway s/p biopsy which per pt report was negative for eosinophilic esophagitis. Pending esophageal manometry. Will request records today.      Chronic pelvic pain in female 05/05/2013   Levator spasm 05/05/2013      GENERAL REVIEW OF SYSTEMS:   General ROS: negative for - chills, fatigue, fever, weight gain or weight loss Allergy and Immunology ROS: negative for - hives  Hematological and Lymphatic ROS: negative for - bleeding problems or bruising, negative for palpable  nodes Endocrine ROS: negative for - heat or cold intolerance, hair changes Respiratory ROS: negative for - cough, shortness of breath or wheezing Cardiovascular ROS: no chest pain or palpitations GI ROS: negative for nausea, vomiting, abdominal pain, diarrhea, positive for constipation Musculoskeletal ROS: Positive for - joint swelling or muscle pain Neurological ROS: negative for - confusion, syncope.  Positive for headaches and dizziness Dermatological ROS: negative for pruritus and rash Psychiatric: Positive for anxiety, depression, difficulty sleeping and memory loss  MEDICATIONS: Current Medications        Current Outpatient Medications  Medication Sig Dispense Refill   ascorbic acid (VITAMIN C ORAL) Take 1 tablet by mouth as needed     baclofen (LIORESAL) 10 MG tablet Take 10 mg by mouth 3 (three) times daily        calcium carbonate (TUMS E-X) 300 mg (750 mg) chewable tablet Take 300 mg of elemental by mouth as directed for Heartburn Patient takes 2-3 tablets PO BID.        camphor-menthol (SARNA) lotion Apply 1 applicator topically as needed for Itching.     cholecalciferol (VITAMIN D3) 2,000 unit capsule Take 1 capsule by mouth once daily     clobetasol (TEMOVATE) 0.05 % cream Apply topically on elbows as needed       clonazePAM (KLONOPIN) 1 MG tablet Take 1/2 tablet every morning and 1 tablet every evening        diclofenac (VOLTAREN) 1 % topical gel APPLY THIN LAYER EXTERNALLY TO THE AFFECTED AREA UP TO FOUR TIMES DAILY AS NEEDED     dronabinol (MARINOL) 2.5 MG capsule Take 2.5 mg by mouth as needed       hyoscyamine (LEVSIN/SL) 0.125 mg SL tablet DISSOLVE 1 TABLET(0.125 MG) UNDER THE TONGUE EVERY 4 HOURS AS NEEDED     metroNIDAZOLE (METROGEL) 1 % gel Apply 1 Application topically 2 (two) times daily as needed       NUCYNTA 100 mg tablet Take 100 mg by mouth every 6 (six) hours    0   predniSONE (DELTASONE) 20 MG tablet Take 60 mg by mouth  once daily     promethazine (PHENERGAN) 25 MG tablet Take 25 mg by mouth every 8 (eight) hours as needed       thyroid (ARMOUR THYROID) 60 mg tablet TAKE 1 TABLET BY MOUTH 4 DAYS PER WEEK. TAKE 2 TABLETS ON MONDAY, WEDNESDAY AND SATURDAY 112 tablet 3   topiramate (TOPAMAX) 50 MG tablet Take 50 mg by mouth once daily        No current facility-administered medications for this visit.      ALLERGIES: Amoxicillin-pot clavulanate, Fluconazole, Influenza virus vaccines, Levothyroxine, Erythromycin, Gabapentin, Other, Pregabalin, Sulfa (sulfonamide antibiotics), Thimerosal, Adhesive, Adhesive tape-silicones, Azithromycin, Levothyroxine, Macrobid [nitrofurantoin monohyd/m-cryst], Macrolide antibiotics, Nickel, and Sumatriptan  PAST  MEDICAL HISTORY:     Past Medical History:  Diagnosis Date   Anemia    History of low iron and B12   Anxiety    Arachnoiditis    COPD (chronic obstructive pulmonary disease) (CMS-HCC)    Fibromyalgia    GERD (gastroesophageal reflux disease)    Hypothyroidism due to Hashimoto's thyroiditis    IBS (irritable bowel syndrome)    Migraines    Motion sickness    PONV (postoperative nausea and vomiting)    Rheumatoid arthritis, adult (CMS-HCC)    Sleep apnea    Vitamin deficiency     PAST SURGICAL HISTORY:      Past Surgical History:  Procedure Laterality Date   ARTHRODESIS ANTERIOR CERVICLE SPINE N/A 07/17/2017   Procedure: Removal of previous Unknown  plate at  O9-6 then Anterior Cervical Discectomy and fusion at C 6-7  with Vertigraft VG1 allograft and Nuvasive Archon plating;  Surgeon: Redge Gainer, MD;  Location: [facility];  Service: Neurosurgery;  Laterality: N/A;   ARTHRODESIS ANTERIOR CERVICLE SPINE N/A 07/17/2017   Procedure: ARTHRODESIS ANT INTERBODY INC DISCECTOMY, CERVICAL EACH ADDL;  Surgeon: Redge Gainer, MD;  Location: [facility];  Service: Neurosurgery;  Laterality: N/A;    Arthroscopic shoulder surgery Left    [place]  2001   CHOLECYSTECTOMY     COLONOSCOPY  08/22/2006   Dr. Ivor Messier @ [place]   COLONOSCOPY  09/24/2016   Negative colon biopsy/Normal Colon/Repeat 89yr/MUS   COLONOSCOPY W/BIOPSY N/A 03/10/2019   Procedure: Colonoscopy;  Surgeon: OTrixie Deis MD;  Location: [facility];  Service: Gastroenterology;  Laterality: N/A;   COLONOSCOPY W/REMOVAL LESIONS BY SNARE N/A 03/10/2019   Procedure: COLONOSCOPY, FLEXIBLE; WITH REMOVAL OF TUMOR(S), POLYP(S), OR OTHER LESION(S) BY SNARE TECHNIQUE;  Surgeon: OTrixie Deis MD;  Location: [facility];  Service: Gastroenterology;  Laterality: N/A;   EGD  09/24/2016   Negative for H. Pylori/Reflux changes/No Repeat/TKT   ESOPHAGOGASTRODOUDENOSCOPY W/BIOPSY N/A 03/10/2019   Procedure: Upper Endoscopy (EGD);  Surgeon: OTrixie Deis MD;  Location: [facility];  Service: Gastroenterology;  Laterality: N/A;  Schedule in [facility] ONLY   INSERTION STRUCTURAL BONE ALLOGRAFT FOR SPINE SURGERY N/A 07/17/2017   Procedure: INSERTION STRUCTURAL BONE ALLOGRAFT FOR SPINE SURGERY;  Surgeon: HRedge Gainer MD;  Location: [facility];  Service: Neurosurgery;  Laterality: N/A;   INSTRUMENTATION ANTERIOR SPINE 2 TO 3 VERTEBRAL SEGMENTS N/A 07/17/2017   Procedure: INSTRUMENTATION ANTERIOR SPINE 2 TO 3 VERTEBRAL SEGMENTS;  Surgeon: HRedge Gainer MD;  Location: [facility];  Service: Neurosurgery;  Laterality: N/A;   Lumbar back surgery     X 12   PELVIC LAPAROSCOPY     REMOVAL ANTERIOR CERVICLE SPINAL HARDWARE N/A 07/17/2017   Procedure: REMOVAL ANTERIOR CERVICAL SPINAL HARDWARE;  Surgeon: HRedge Gainer MD;  Location: [facility];  Service: Neurosurgery;  Laterality: N/A;   SLING FOR STRESS INCONTINENCE  2008   SPINE SURGERY     3 c-spine & ~12 lumbar surgeries, &  spinal cord stimulator   TONSILECTOMY  01/01/1970   UPPER ENDOSCOPY  01/02/1999   & ESOPHAGEAL STRETCH.    UPPER MANDIBLE RECONSTRUCTION  07/01/1994   ALSO IN 2001     FAMILY HISTORY:      Family History  Problem Relation Age of Onset   Breast cancer Maternal Aunt    Diabetes Maternal Grandfather    Heart disease Maternal  Grandfather    Coronary Artery Disease (Blocked arteries around heart) Maternal Grandfather    Diabetes Paternal Grandfather    Lung cancer Paternal Grandfather    Thyroid disease Mother    Adrenal disorder Mother    Anxiety Mother    Depression Mother    Lung cancer Father    Diabetes Brother    Colon cancer Paternal Grandmother    Diabetes Cousin    Thyroid disease Cousin    Diabetes Daughter    Alzheimer's disease Maternal Grandmother    Anesthesia problems Neg Hx    Malignant hyperthermia Neg Hx      SOCIAL HISTORY: Social History          Socioeconomic History   Marital status: Married    Spouse name: Not on file   Number of children: Not on file   Years of education: Not on file   Highest education level: Not on file  Occupational History   Not on file  Tobacco Use   Smoking status: Never Smoker   Smokeless tobacco: Never Used  Vaping Use   Vaping Use: Never used  Substance and Sexual Activity   Alcohol use: Not Currently    Comment: Very rarely. May only have one glass wine to relax.   Drug use: No   Sexual activity: Not Currently    Partners: Male    Birth control/protection: Post-menopausal    Comment: Married 29 years  Other Topics Concern   Not on file  Social History Narrative   Not on file   Social Determinants of Health      Financial Resource Strain:    Difficulty of Paying Living Expenses:   Food Insecurity:    Worried About Charity fundraiser in the Last Year:    Arboriculturist in the Last Year:   Transportation Needs:    Lexicographer (Medical):    Lack of Transportation (Non-Medical):       PHYSICAL EXAM:    Vitals:   09/01/19 1501  BP: 105/74  Pulse: 78   Body mass index is 24.51 kg/m. Weight: 75.3 kg (166 lb)   GENERAL: Alert, active, oriented x3  HEENT: Pupils equal reactive to light. Extraocular movements are intact. Sclera clear. Palpebral conjunctiva normal red color.Pharynx clear.  NECK: Supple with no palpable mass and no adenopathy.  LUNGS: Sound clear with no rales rhonchi or wheezes.  HEART: Regular rhythm S1 and S2 without murmur.  ABDOMEN: Soft and depressible, nontender with no palpable mass, no hepatomegaly. Wounds dry and clean.  EXTREMITIES: Well-developed well-nourished symmetrical with no dependent edema.  NEUROLOGICAL: Awake alert oriented, facial expression symmetrical, moving all extremities.  REVIEW OF DATA: I have reviewed the following data today:      No visits with results within 3 Month(s) from this visit.  Latest known visit with results is:  Appointment on 03/18/2019  Component Date Value   Thyroid Stimulating Horm* 03/18/2019 0.482      ASSESSMENT: Karen Aguilar is a 54 y.o. female presenting for consultation for left temporal artery biopsy.  Patient with left-sided headache mostly concentrated on the temporal area.  Patient also with vision changes symptoms.  Patient also with elevated CRP.  Patient was evaluated by rheumatologist recommended temporal artery biopsy.  The patient was oriented about the purpose of the temporal artery biopsy.  The patient was oriented about the procedure and the risk of surgery that includes: Infection, bleeding, pain, scar, among others.  The patient report  understood and agreed to proceed.  Temporal headache [R51.9]  PLAN: 1. Left temporal artery biopsy (15615) 2. Avoid taking any aspirin or blood thinner 3. Contact us if you have any concern.   Patient verbalized understanding, all questions were  answered, and were agreeable with the plan outlined above.     [name], MD  Electronically signed by [name], MD

## 2019-09-01 NOTE — H&P (View-Only) (Signed)
PATIENT PROFILE: Karen Aguilar is a 54 y.o. female who presents to the Clinic for consultation at the request of Dr. Posey Pronto for evaluation of left temporal artery biopsy.  PCP:  Ria Bush, MD  HISTORY OF PRESENT ILLNESS: Karen Aguilar reports having headaches since few months ago.  She reported that it is mostly on the left side of the head.  Pain is concentrated on the temporal area.  Sometimes pain radiates to the face and neck.  She does report worsening blurry vision on the left side more than the right side.  There has been no alleviating or aggravating factors.  Patient has been evaluated by rheumatologist.  Due to elevation of CRP and the symptoms of this patient, temporal artery was he was recommended.   PROBLEM LIST:        Problem List  Date Reviewed: 08/31/2019       Noted   Neck pain 07/18/2017   Hyperlipidemia 07/18/2017   Overview    Last Assessment & Plan:  Chronic. Reviewed diet changes to improve lipid levels.      MDD (major depressive disorder), recurrent episode, moderate (CMS-HCC) 07/18/2017   Overview    Last Assessment & Plan:  Stable period off antidepressant. Continues klonopin 0.60m bid.      GERD (gastroesophageal reflux disease) 07/16/2017   Overview    rare zantac PRN  Last Assessment & Plan:  Stable period off dexilant, only using zantac and carafate PRN.      Complicated migraine 70/92/3300  Overview    and h/o optical migraine  Last Assessment & Plan:  Reviewed current regimen. Cambia was too expensive. Considering plain diclofenac - but caution in h/o GERD and possible gastroparesis      Hypothyroidism 07/16/2017   Overview    Last Assessment & Plan:  Stable period. Followed by endo Dr SGabriel Carina Has f/u appt next month.      Neurogenic bladder 07/16/2017   Overview    after 13 spine surgeries with periph neuropathy and chronic LBP  Last Assessment & Plan:  Planned f/u with neuro-urologist       Chronic pain syndrome 07/16/2017   Overview    Pain managed by Dr PHardin Negusof [facility]. Chronic pain stems from DDD s/p several surgeries and resulting arachnoiditis  Last Assessment & Plan:  Continue f/u with pain clinic (Hardin Negus      Fibromyalgia 07/16/2017   Overview    Last Assessment & Plan:  Ongoing struggle.      Sleep apnea 07/16/2017   Overview    mild-doesn't use cpap      Mass of chest wall, left 05/09/2017   Overview    Last Assessment & Plan:  This is likely a lipoma or sebaceous cyst Reassured--nothing to indicate cancer or anything serious Can refer to surgeon for excision if bothers her      DDD (degenerative disc disease), cervical 06/09/2016   Overview    MRI 03/2016 - stable disease, s/p ACDF C4/5 C5/6, chronic R eccentric disc degeneration at C7-T1, mild R C4, mod R C7, mod-severe R C8 neural foraminal stenosis appears stable      GAD (generalized anxiety disorder) 02/18/2016   Overview    Last Assessment & Plan:  Acute worsening, ?panic attack contributing to some symptoms. I did suggest try higher klonopin dose next few days (2 tab BID PRN instead of 1) and update with effect.      Hypothyroidism due to Hashimoto's thyroiditis Unknown   Irritable bowel syndrome  02/23/2014   Overview    Last Assessment & Plan:  Continue levsin PRN      Esophageal dysphagia 06/05/2013   Overview    Last Assessment & Plan:  Has established with Dr Benson Norway s/p biopsy which per pt report was negative for eosinophilic esophagitis. Pending esophageal manometry. Will request records today.      Chronic pelvic pain in female 05/05/2013   Levator spasm 05/05/2013      GENERAL REVIEW OF SYSTEMS:   General ROS: negative for - chills, fatigue, fever, weight gain or weight loss Allergy and Immunology ROS: negative for - hives  Hematological and Lymphatic ROS: negative for - bleeding problems or bruising, negative for palpable  nodes Endocrine ROS: negative for - heat or cold intolerance, hair changes Respiratory ROS: negative for - cough, shortness of breath or wheezing Cardiovascular ROS: no chest pain or palpitations GI ROS: negative for nausea, vomiting, abdominal pain, diarrhea, positive for constipation Musculoskeletal ROS: Positive for - joint swelling or muscle pain Neurological ROS: negative for - confusion, syncope.  Positive for headaches and dizziness Dermatological ROS: negative for pruritus and rash Psychiatric: Positive for anxiety, depression, difficulty sleeping and memory loss  MEDICATIONS: Current Medications        Current Outpatient Medications  Medication Sig Dispense Refill  . ascorbic acid (VITAMIN C ORAL) Take 1 tablet by mouth as needed    . baclofen (LIORESAL) 10 MG tablet Take 10 mg by mouth 3 (three) times daily       . calcium carbonate (TUMS E-X) 300 mg (750 mg) chewable tablet Take 300 mg of elemental by mouth as directed for Heartburn Patient takes 2-3 tablets PO BID.       Marland Kitchen camphor-menthol (SARNA) lotion Apply 1 applicator topically as needed for Itching.    . cholecalciferol (VITAMIN D3) 2,000 unit capsule Take 1 capsule by mouth once daily    . clobetasol (TEMOVATE) 0.05 % cream Apply topically on elbows as needed      . clonazePAM (KLONOPIN) 1 MG tablet Take 1/2 tablet every morning and 1 tablet every evening       . diclofenac (VOLTAREN) 1 % topical gel APPLY THIN LAYER EXTERNALLY TO THE AFFECTED AREA UP TO FOUR TIMES DAILY AS NEEDED    . dronabinol (MARINOL) 2.5 MG capsule Take 2.5 mg by mouth as needed      . hyoscyamine (LEVSIN/SL) 0.125 mg SL tablet DISSOLVE 1 TABLET(0.125 MG) UNDER THE TONGUE EVERY 4 HOURS AS NEEDED    . metroNIDAZOLE (METROGEL) 1 % gel Apply 1 Application topically 2 (two) times daily as needed      . NUCYNTA 100 mg tablet Take 100 mg by mouth every 6 (six) hours    0  . predniSONE (DELTASONE) 20 MG tablet Take 60 mg by mouth  once daily    . promethazine (PHENERGAN) 25 MG tablet Take 25 mg by mouth every 8 (eight) hours as needed      . thyroid (ARMOUR THYROID) 60 mg tablet TAKE 1 TABLET BY MOUTH 4 DAYS PER WEEK. TAKE 2 TABLETS ON MONDAY, WEDNESDAY AND SATURDAY 112 tablet 3  . topiramate (TOPAMAX) 50 MG tablet Take 50 mg by mouth once daily        No current facility-administered medications for this visit.      ALLERGIES: Amoxicillin-pot clavulanate, Fluconazole, Influenza virus vaccines, Levothyroxine, Erythromycin, Gabapentin, Other, Pregabalin, Sulfa (sulfonamide antibiotics), Thimerosal, Adhesive, Adhesive tape-silicones, Azithromycin, Levothyroxine, Macrobid [nitrofurantoin monohyd/m-cryst], Macrolide antibiotics, Nickel, and Sumatriptan  PAST  MEDICAL HISTORY:     Past Medical History:  Diagnosis Date  . Anemia    History of low iron and B12  . Anxiety   . Arachnoiditis   . COPD (chronic obstructive pulmonary disease) (CMS-HCC)   . Fibromyalgia   . GERD (gastroesophageal reflux disease)   . Hypothyroidism due to Hashimoto's thyroiditis   . IBS (irritable bowel syndrome)   . Migraines   . Motion sickness   . PONV (postoperative nausea and vomiting)   . Rheumatoid arthritis, adult (CMS-HCC)   . Sleep apnea   . Vitamin deficiency     PAST SURGICAL HISTORY:      Past Surgical History:  Procedure Laterality Date  . ARTHRODESIS ANTERIOR CERVICLE SPINE N/A 07/17/2017   Procedure: Removal of previous Unknown  plate at  M0-1 then Anterior Cervical Discectomy and fusion at C 6-7  with Vertigraft VG1 allograft and Nuvasive Archon plating;  Surgeon: Redge Gainer, MD;  Location: [facility];  Service: Neurosurgery;  Laterality: N/A;  . ARTHRODESIS ANTERIOR CERVICLE SPINE N/A 07/17/2017   Procedure: ARTHRODESIS ANT INTERBODY INC DISCECTOMY, CERVICAL EACH ADDL;  Surgeon: Redge Gainer, MD;  Location: [facility];  Service: Neurosurgery;  Laterality: N/A;   . Arthroscopic shoulder surgery Left   . CESAREAN SECTION  1996  . CESAREAN SECTION  2001  . CHOLECYSTECTOMY    . COLONOSCOPY  08/22/2006   Dr. Ivor Messier @ [facility]  . COLONOSCOPY  09/24/2016   Negative colon biopsy/Normal Colon/Repeat 45yr/MUS  . COLONOSCOPY W/BIOPSY N/A 03/10/2019   Procedure: Colonoscopy;  Surgeon: OTrixie Deis MD;  Location: [facility];  Service: Gastroenterology;  Laterality: N/A;  . COLONOSCOPY W/REMOVAL LESIONS BY SNARE N/A 03/10/2019   Procedure: COLONOSCOPY, FLEXIBLE; WITH REMOVAL OF TUMOR(S), POLYP(S), OR OTHER LESION(S) BY SNARE TECHNIQUE;  Surgeon: OTrixie Deis MD;  Location: [facility];  Service: Gastroenterology;  Laterality: N/A;  . EGD  09/24/2016   Negative for H. Pylori/Reflux changes/No Repeat/TKT  . ESOPHAGOGASTRODOUDENOSCOPY W/BIOPSY N/A 03/10/2019   Procedure: Upper Endoscopy (EGD);  Surgeon: OTrixie Deis MD;  Location: [facility];  Service: Gastroenterology;  Laterality: N/A;  Schedule in [facility] ONLY  . INSERTION STRUCTURAL BONE ALLOGRAFT FOR SPINE SURGERY N/A 07/17/2017   Procedure: INSERTION STRUCTURAL BONE ALLOGRAFT FOR SPINE SURGERY;  Surgeon: HRedge Gainer MD;  Location: [facility];  Service: Neurosurgery;  Laterality: N/A;  . INSTRUMENTATION ANTERIOR SPINE 2 TO 3 VERTEBRAL SEGMENTS N/A 07/17/2017   Procedure: INSTRUMENTATION ANTERIOR SPINE 2 TO 3 VERTEBRAL SEGMENTS;  Surgeon: HRedge Gainer MD;  Location: [facility];  Service: Neurosurgery;  Laterality: N/A;  . Lumbar back surgery     X 12  . PELVIC LAPAROSCOPY    . REMOVAL ANTERIOR CERVICLE SPINAL HARDWARE N/A 07/17/2017   Procedure: REMOVAL ANTERIOR CERVICAL SPINAL HARDWARE;  Surgeon: HRedge Gainer MD;  Location: [facility];  Service: Neurosurgery;  Laterality: N/A;  . SLING FOR STRESS INCONTINENCE  2008  . SPINE SURGERY     3 c-spine & ~12 lumbar surgeries, &  spinal cord stimulator  . TONSILECTOMY  01/01/1970  . UPPER ENDOSCOPY  01/02/1999   & ESOPHAGEAL STRETCH.   .Marland KitchenUPPER MANDIBLE RECONSTRUCTION  07/01/1994   ALSO IN 2001     FAMILY HISTORY:      Family History  Problem Relation Age of Onset  . Breast cancer Maternal Aunt   . Diabetes Maternal Grandfather   . Heart disease Maternal  Grandfather   . Coronary Artery Disease (Blocked arteries around heart) Maternal Grandfather   . Diabetes Paternal Grandfather   . Lung cancer Paternal Grandfather   . Thyroid disease Mother   . Adrenal disorder Mother   . Anxiety Mother   . Depression Mother   . Lung cancer Father   . Diabetes Brother   . Colon cancer Paternal Grandmother   . Diabetes Cousin   . Thyroid disease Cousin   . Diabetes Daughter   . Alzheimer's disease Maternal Grandmother   . Anesthesia problems Neg Hx   . Malignant hyperthermia Neg Hx      SOCIAL HISTORY: Social History          Socioeconomic History  . Marital status: Married    Spouse name: Not on file  . Number of children: Not on file  . Years of education: Not on file  . Highest education level: Not on file  Occupational History  . Not on file  Tobacco Use  . Smoking status: Never Smoker  . Smokeless tobacco: Never Used  Vaping Use  . Vaping Use: Never used  Substance and Sexual Activity  . Alcohol use: Not Currently    Comment: Very rarely. May only have one glass wine to relax.  . Drug use: No  . Sexual activity: Not Currently    Partners: Male    Birth control/protection: Post-menopausal    Comment: Married 29 years  Other Topics Concern  . Not on file  Social History Narrative  . Not on file   Social Determinants of Health      Financial Resource Strain:   . Difficulty of Paying Living Expenses:   Food Insecurity:   . Worried About Charity fundraiser in the Last Year:   . Arboriculturist in the Last Year:   Transportation Needs:   . Lexicographer (Medical):   Marland Kitchen Lack of Transportation (Non-Medical):       PHYSICAL EXAM:    Vitals:   09/01/19 1501  BP: 105/74  Pulse: 78   Body mass index is 24.51 kg/m. Weight: 75.3 kg (166 lb)   GENERAL: Alert, active, oriented x3  HEENT: Pupils equal reactive to light. Extraocular movements are intact. Sclera clear. Palpebral conjunctiva normal red color.Pharynx clear.  NECK: Supple with no palpable mass and no adenopathy.  LUNGS: Sound clear with no rales rhonchi or wheezes.  HEART: Regular rhythm S1 and S2 without murmur.  ABDOMEN: Soft and depressible, nontender with no palpable mass, no hepatomegaly. Wounds dry and clean.  EXTREMITIES: Well-developed well-nourished symmetrical with no dependent edema.  NEUROLOGICAL: Awake alert oriented, facial expression symmetrical, moving Aguilar extremities.  REVIEW OF DATA: I have reviewed the following data today:      No visits with results within 3 Month(s) from this visit.  Latest known visit with results is:  Appointment on 03/18/2019  Component Date Value  . Thyroid Stimulating Horm* 03/18/2019 0.482      ASSESSMENT: Karen Aguilar is a 54 y.o. female presenting for consultation for left temporal artery biopsy.  Patient with left-sided headache mostly concentrated on the temporal area.  Patient also with vision changes symptoms.  Patient also with elevated CRP.  Patient was evaluated by rheumatologist recommended temporal artery biopsy.  The patient was oriented about the purpose of the temporal artery biopsy.  The patient was oriented about the procedure and the risk of surgery that includes: Infection, bleeding, pain, scar, among others.  The patient report  understood and agreed to proceed.  Temporal headache [R51.9]  PLAN: 1. Left temporal artery biopsy (67591) 2. Avoid taking any aspirin or blood thinner 3. Contact us if you have any concern.   Patient verbalized understanding, Aguilar questions were  answered, and were agreeable with the plan outlined above.     [name], MD  Electronically signed by [name], MD

## 2019-09-02 ENCOUNTER — Ambulatory Visit: Payer: Medicare Other | Admitting: Certified Registered Nurse Anesthetist

## 2019-09-02 ENCOUNTER — Encounter
Admission: RE | Disposition: A | Discharge status: Home / Self Care | Payer: Self-pay | Source: Home / Self Care | Attending: General Surgery

## 2019-09-02 ENCOUNTER — Encounter: Payer: Self-pay | Admitting: General Surgery

## 2019-09-02 ENCOUNTER — Other Ambulatory Visit: Payer: Medicare Other

## 2019-09-02 ENCOUNTER — Ambulatory Visit
Admission: RE | Admit: 2019-09-02 | Discharge: 2019-09-02 | Disposition: A | Discharge status: Home / Self Care | Payer: Medicare Other | Attending: General Surgery | Admitting: General Surgery

## 2019-09-02 DIAGNOSIS — Z880 Allergy status to penicillin: Secondary | ICD-10-CM | POA: Diagnosis not present

## 2019-09-02 DIAGNOSIS — J449 Chronic obstructive pulmonary disease, unspecified: Secondary | ICD-10-CM | POA: Diagnosis not present

## 2019-09-02 DIAGNOSIS — Z887 Allergy status to serum and vaccine status: Secondary | ICD-10-CM | POA: Diagnosis not present

## 2019-09-02 DIAGNOSIS — Z79899 Other long term (current) drug therapy: Secondary | ICD-10-CM | POA: Insufficient documentation

## 2019-09-02 DIAGNOSIS — E039 Hypothyroidism, unspecified: Secondary | ICD-10-CM | POA: Diagnosis not present

## 2019-09-02 DIAGNOSIS — Z881 Allergy status to other antibiotic agents status: Secondary | ICD-10-CM | POA: Diagnosis not present

## 2019-09-02 DIAGNOSIS — F329 Major depressive disorder, single episode, unspecified: Secondary | ICD-10-CM | POA: Diagnosis not present

## 2019-09-02 DIAGNOSIS — Z882 Allergy status to sulfonamides status: Secondary | ICD-10-CM | POA: Insufficient documentation

## 2019-09-02 DIAGNOSIS — M069 Rheumatoid arthritis, unspecified: Secondary | ICD-10-CM | POA: Insufficient documentation

## 2019-09-02 DIAGNOSIS — K219 Gastro-esophageal reflux disease without esophagitis: Secondary | ICD-10-CM | POA: Diagnosis not present

## 2019-09-02 DIAGNOSIS — Z888 Allergy status to other drugs, medicaments and biological substances status: Secondary | ICD-10-CM | POA: Insufficient documentation

## 2019-09-02 DIAGNOSIS — R519 Headache, unspecified: Secondary | ICD-10-CM | POA: Insufficient documentation

## 2019-09-02 DIAGNOSIS — G4730 Sleep apnea, unspecified: Secondary | ICD-10-CM | POA: Insufficient documentation

## 2019-09-02 DIAGNOSIS — Z79890 Hormone replacement therapy (postmenopausal): Secondary | ICD-10-CM | POA: Insufficient documentation

## 2019-09-02 DIAGNOSIS — F411 Generalized anxiety disorder: Secondary | ICD-10-CM | POA: Diagnosis not present

## 2019-09-02 DIAGNOSIS — M797 Fibromyalgia: Secondary | ICD-10-CM | POA: Diagnosis not present

## 2019-09-02 DIAGNOSIS — E063 Autoimmune thyroiditis: Secondary | ICD-10-CM | POA: Insufficient documentation

## 2019-09-02 DIAGNOSIS — Z91041 Radiographic dye allergy status: Secondary | ICD-10-CM | POA: Insufficient documentation

## 2019-09-02 DIAGNOSIS — Z8349 Family history of other endocrine, nutritional and metabolic diseases: Secondary | ICD-10-CM | POA: Insufficient documentation

## 2019-09-02 DIAGNOSIS — E785 Hyperlipidemia, unspecified: Secondary | ICD-10-CM | POA: Diagnosis not present

## 2019-09-02 DIAGNOSIS — Z8249 Family history of ischemic heart disease and other diseases of the circulatory system: Secondary | ICD-10-CM | POA: Insufficient documentation

## 2019-09-02 DIAGNOSIS — K589 Irritable bowel syndrome without diarrhea: Secondary | ICD-10-CM | POA: Diagnosis not present

## 2019-09-02 DIAGNOSIS — R7982 Elevated C-reactive protein (CRP): Secondary | ICD-10-CM | POA: Diagnosis not present

## 2019-09-02 HISTORY — PX: ARTERY BIOPSY: SHX891

## 2019-09-02 SURGERY — BIOPSY TEMPORAL ARTERY
Anesthesia: General | Site: Head | Laterality: Left

## 2019-09-02 MED ORDER — CHLORHEXIDINE GLUCONATE 0.12 % MT SOLN: 15.0000 mL | Freq: Once | OROMUCOSAL | Status: AC

## 2019-09-02 MED ORDER — FENTANYL CITRATE (PF) 100 MCG/2ML IJ SOLN
INTRAMUSCULAR | Status: DC | PRN
Start: 2019-09-02 — End: 2019-09-02
  Administered 2019-09-02: 25 ug via INTRAVENOUS

## 2019-09-02 MED ORDER — FENTANYL CITRATE (PF) 100 MCG/2ML IJ SOLN: 25.0000 ug | INTRAMUSCULAR | Status: DC | PRN

## 2019-09-02 MED ORDER — ONDANSETRON HCL 4 MG/2ML IJ SOLN
INTRAMUSCULAR | Status: AC
  Filled 2019-09-02: qty 2

## 2019-09-02 MED ORDER — PROPOFOL 500 MG/50ML IV EMUL
INTRAVENOUS | Status: AC
  Filled 2019-09-02: qty 50

## 2019-09-02 MED ORDER — FENTANYL CITRATE (PF) 100 MCG/2ML IJ SOLN
INTRAMUSCULAR | Status: AC
  Filled 2019-09-02: qty 2

## 2019-09-02 MED ORDER — PROPOFOL 10 MG/ML IV BOLUS
INTRAVENOUS | Status: DC | PRN
  Administered 2019-09-02: 50 mg via INTRAVENOUS

## 2019-09-02 MED ORDER — OXYCODONE HCL 5 MG PO TABS: 5.0000 mg | ORAL_TABLET | Freq: Once | ORAL | Status: DC | PRN

## 2019-09-02 MED ORDER — DEXMEDETOMIDINE HCL 200 MCG/2ML IV SOLN
INTRAVENOUS | Status: DC | PRN
  Administered 2019-09-02 (×2): 4 ug via INTRAVENOUS
  Administered 2019-09-02: 8 ug via INTRAVENOUS
  Administered 2019-09-02: 4 ug via INTRAVENOUS

## 2019-09-02 MED ORDER — LACTATED RINGERS IV SOLN: INTRAVENOUS | Status: DC

## 2019-09-02 MED ORDER — ORAL CARE MOUTH RINSE: 15.0000 mL | Freq: Once | OROMUCOSAL | Status: AC

## 2019-09-02 MED ORDER — LIDOCAINE-EPINEPHRINE 1 %-1:100000 IJ SOLN
INTRAMUSCULAR | Status: DC | PRN
  Administered 2019-09-02: 3 mL

## 2019-09-02 MED ORDER — LIDOCAINE HCL (PF) 2 % IJ SOLN
INTRAMUSCULAR | Status: AC
  Filled 2019-09-02: qty 5

## 2019-09-02 MED ORDER — ONDANSETRON HCL 4 MG/2ML IJ SOLN
INTRAMUSCULAR | Status: DC | PRN
  Administered 2019-09-02: 4 mg via INTRAVENOUS

## 2019-09-02 MED ORDER — DEXAMETHASONE SODIUM PHOSPHATE 10 MG/ML IJ SOLN
INTRAMUSCULAR | Status: DC | PRN
  Administered 2019-09-02: 10 mg via INTRAVENOUS

## 2019-09-02 MED ORDER — DEXAMETHASONE SODIUM PHOSPHATE 10 MG/ML IJ SOLN
INTRAMUSCULAR | Status: AC
  Filled 2019-09-02: qty 1

## 2019-09-02 MED ORDER — CHLORHEXIDINE GLUCONATE 0.12 % MT SOLN
OROMUCOSAL | Status: AC
  Administered 2019-09-02: 15 mL via OROMUCOSAL
  Filled 2019-09-02: qty 15

## 2019-09-02 MED ORDER — MIDAZOLAM HCL 2 MG/2ML IJ SOLN
INTRAMUSCULAR | Status: DC | PRN
  Administered 2019-09-02: 2 mg via INTRAVENOUS

## 2019-09-02 MED ORDER — PROPOFOL 500 MG/50ML IV EMUL
INTRAVENOUS | Status: DC | PRN
  Administered 2019-09-02: 150 ug/kg/min via INTRAVENOUS

## 2019-09-02 MED ORDER — OXYCODONE HCL 5 MG/5ML PO SOLN: 5.0000 mg | Freq: Once | ORAL | Status: DC | PRN

## 2019-09-02 MED ORDER — FAMOTIDINE 20 MG PO TABS: 20.0000 mg | ORAL_TABLET | Freq: Once | ORAL | Status: AC

## 2019-09-02 MED ORDER — MIDAZOLAM HCL 2 MG/2ML IJ SOLN
INTRAMUSCULAR | Status: AC
  Filled 2019-09-02: qty 2

## 2019-09-02 MED ORDER — EPHEDRINE SULFATE 50 MG/ML IJ SOLN
INTRAMUSCULAR | Status: DC | PRN
  Administered 2019-09-02 (×2): 5 mg via INTRAVENOUS

## 2019-09-02 MED ORDER — FAMOTIDINE 20 MG PO TABS
ORAL_TABLET | ORAL | Status: AC
  Administered 2019-09-02: 20 mg via ORAL
  Filled 2019-09-02: qty 1

## 2019-09-02 MED ORDER — HYDROCODONE-ACETAMINOPHEN 5-325 MG PO TABS: 1.0000 | ORAL_TABLET | ORAL | 0 refills | Status: AC | PRN

## 2019-09-02 SURGICAL SUPPLY — 45 items
BLADE SURG 15 STRL LF DISP TIS (BLADE) ×1 IMPLANT
BLADE SURG 15 STRL SS (BLADE) ×1
BLADE SURG SZ11 CARB STEEL (BLADE) ×2 IMPLANT
CNTNR SPEC 2.5X3XGRAD LEK (MISCELLANEOUS)
CONT SPEC 4OZ STER OR WHT (MISCELLANEOUS)
CONTAINER SPEC 2.5X3XGRAD LEK (MISCELLANEOUS) IMPLANT
COTTON BALL STRL MEDIUM (GAUZE/BANDAGES/DRESSINGS) ×2 IMPLANT
COVER WAND RF STERILE (DRAPES) ×2 IMPLANT
DERMABOND ADVANCED (GAUZE/BANDAGES/DRESSINGS) ×1
DERMABOND ADVANCED .7 DNX12 (GAUZE/BANDAGES/DRESSINGS) ×1 IMPLANT
DRAPE LAPAROTOMY 77X122 PED (DRAPES) ×2 IMPLANT
DRSG TELFA 4X3 1S NADH ST (GAUZE/BANDAGES/DRESSINGS) ×2 IMPLANT
ELECT CAUTERY BLADE 6.4 (BLADE) ×2 IMPLANT
ELECT REM PT RETURN 9FT ADLT (ELECTROSURGICAL) ×2
ELECTRODE REM PT RTRN 9FT ADLT (ELECTROSURGICAL) ×1 IMPLANT
GLOVE BIO SURGEON STRL SZ 6.5 (GLOVE) ×2 IMPLANT
GLOVE BIOGEL PI IND STRL 6.5 (GLOVE) ×1 IMPLANT
GLOVE BIOGEL PI INDICATOR 6.5 (GLOVE) ×1
GOWN STRL REUS W/ TWL LRG LVL3 (GOWN DISPOSABLE) ×1 IMPLANT
GOWN STRL REUS W/ TWL XL LVL3 (GOWN DISPOSABLE) ×2 IMPLANT
GOWN STRL REUS W/TWL LRG LVL3 (GOWN DISPOSABLE) ×1
GOWN STRL REUS W/TWL XL LVL3 (GOWN DISPOSABLE) ×2
KIT TURNOVER KIT A (KITS) ×2 IMPLANT
LABEL OR SOLS (LABEL) ×2 IMPLANT
NEEDLE HYPO 25X1 1.5 SAFETY (NEEDLE) IMPLANT
NS IRRIG 500ML POUR BTL (IV SOLUTION) ×2 IMPLANT
PACK BASIN MINOR (MISCELLANEOUS) ×2 IMPLANT
SOL PREP PVP 2OZ (MISCELLANEOUS) ×2
SOLUTION PREP PVP 2OZ (MISCELLANEOUS) ×1 IMPLANT
SUCTION FRAZIER HANDLE 10FR (MISCELLANEOUS) ×1
SUCTION TUBE FRAZIER 10FR DISP (MISCELLANEOUS) ×1 IMPLANT
SUT MNCRL AB 4-0 PS2 18 (SUTURE) ×2 IMPLANT
SUT SILK 2 0 (SUTURE) ×1
SUT SILK 2-0 18XBRD TIE 12 (SUTURE) ×1 IMPLANT
SUT SILK 3 0 (SUTURE) ×1
SUT SILK 3-0 18XBRD TIE 12 (SUTURE) ×1 IMPLANT
SUT SILK 4 0 (SUTURE) ×1
SUT SILK 4-0 18XBRD TIE 12 (SUTURE) ×1 IMPLANT
SUT VIC AB 3-0 SH 27 (SUTURE) ×1
SUT VIC AB 3-0 SH 27X BRD (SUTURE) ×1 IMPLANT
SUT VIC AB 4-0 SH 27 (SUTURE) ×1
SUT VIC AB 4-0 SH 27XANBCTRL (SUTURE) ×1 IMPLANT
SYR 10ML LL (SYRINGE) IMPLANT
SYR BULB IRRIG 60ML STRL (SYRINGE) ×2 IMPLANT
TAPE TRANSPORE STRL 2 31045 (GAUZE/BANDAGES/DRESSINGS) ×2 IMPLANT

## 2019-09-02 NOTE — Op Note (Signed)
Pre op diagnosis: Left temporal headache  Post op diagnosis: Left temporal headache  Surgeon: Dr. Windell Moment  Anesthesia: Sedation and local  Indications: This 54 year old female has new onset headache loss of visual acuity and elevated CRP with left temporal artery reduced pulsation. The patient has been informed that a temporal artery biopsy is required to confirm the diagnosis of temporal arteritis. The risks of the procedure were explained to the patient who elected to proceed with the biopsy.   Description of procedure: The patient was placed in the supine position with the head turned so the operative side is up. Sedation was induced by anesthesia. A time-out was completed verifying correct patient, procedure, site, positioning, and implant(s) and/or special equipment prior to beginning this procedure. The left temporal area was prepped and draped in the usual sterile fashion. Local anesthetics (1% Lidocaine) without epinephrine to minimize arterial spasm were injected. A 3.5 cm incision was made directly over the artery through the skin and subcutaneous tissue using a #15 blade scalpel. The temporal artery was identified in the superficial layers of the superficial temporal fascia. Blunt dissection with a hemostat was performed parallel to the vessel to avoid tearing it. The dissection proceeded beneath the vessel so that a hemostat was passed below it. After the vessel was isolated, 4-0 silk sutures were passed around the proximal and distal portions of the isolated artery and tied. Branches of the main artery were also ligated. The vessel was then transected. After ensuring hemostasis, the subcutaneous tissues were closed using interrupted 4-0 Vicryl sutures. The skin was closed with a running subcuticular 4-0 Monocryl sutures. Dermabond were applied over the wound.  The patient tolerated well the procedure.   Specimen: Left temporal artery   [name], MD

## 2019-09-02 NOTE — Transfer of Care (Signed)
Immediate Anesthesia Transfer of Care Note  Patient: Karen Aguilar  Procedure(s) Performed: BIOPSY TEMPORAL ARTERY (Left Head)  Patient Location: PACU  Anesthesia Type:General  Level of Consciousness: awake, alert  and oriented  Airway & Oxygen Therapy: Patient Spontanous Breathing and Patient connected to face mask oxygen  Post-op Assessment: Report given to RN and Post -op Vital signs reviewed and stable  Post vital signs: Reviewed and stable  Last Vitals:  Vitals Value Taken Time  BP 97/82 09/02/19 1224  Temp    Pulse 78 09/02/19 1225  Resp 10 09/02/19 1225  SpO2 100 % 09/02/19 1225  Vitals shown include unvalidated device data.  Last Pain:  Vitals:   09/02/19 1014  TempSrc: Temporal  PainSc: 6          Complications: No complications documented.

## 2019-09-02 NOTE — Interval H&P Note (Signed)
History and Physical Interval Note:  09/02/2019 10:59 AM  Karen Aguilar  has presented today for surgery, with the diagnosis of R51.9 Temporal headache.  The various methods of treatment have been discussed with the patient and family. After consideration of risks, benefits and other options for treatment, the patient has consented to  Procedure(s): BIOPSY TEMPORAL ARTERY (Left) as a surgical intervention.  The patient's history has been reviewed, patient examined, no change in status, stable for surgery.  I have reviewed the patient's chart and labs.  Left temporal area marked in the pre procedure room. Questions were answered to the patient's satisfaction.     [name]

## 2019-09-02 NOTE — Anesthesia Preprocedure Evaluation (Signed)
Anesthesia Evaluation  Patient identified by MRN, date of birth, ID band Patient awake    Reviewed: Allergy & Precautions, H&P , NPO status , Patient's Chart, lab work & pertinent test results  History of Anesthesia Complications (+) PONV, Family history of anesthesia reaction and history of anesthetic complications  Airway Mallampati: III  TM Distance: <3 FB Neck ROM: limited    Dental  (+) Chipped   Pulmonary neg shortness of breath, sleep apnea ,    Pulmonary exam normal        Cardiovascular Exercise Tolerance: Good (-) angina(-) Past MI and (-) DOE negative cardio ROS Normal cardiovascular exam     Neuro/Psych  Headaches, PSYCHIATRIC DISORDERS  Neuromuscular disease    GI/Hepatic Neg liver ROS, GERD  Medicated and Controlled,  Endo/Other  Hypothyroidism   Renal/GU negative Renal ROS  negative genitourinary   Musculoskeletal  (+) Arthritis , Fibromyalgia -  Abdominal   Peds  Hematology negative hematology ROS (+)   Anesthesia Other Findings Past Medical History: 2012: Abnormal MRI scan, bone     Comment:  abnormal marrow signal humeral head, glenoid and scapula              - diffuse replacement of fatty marrow - likely benign, no              need for further investigation ([place]) No date: Agnosia for temperature     Comment:  Right Leg No date: Anemia     Comment:  etiology unknown-iron infusion in past,last 9/12 No date: Anxiety No date: Anxiety and depression No date: Arachnoiditis     Comment:  S1 nerve root, L4/L5 No date: Arthritis No date: Chronic pain     Comment:  pain contract with Dr. Eartha Inch Pain 09/2018: COVID-19 virus infection No date: DDD (degenerative disc disease), cervical No date: DDD (degenerative disc disease), lumbar     Comment:  s/p permanent nerve damage after back surgery No date: Depression No date: Depression with anxiety No date: Diverticulosis No date:  Eczema No date: Family history of adverse reaction to anesthesia     Comment:  Mother - nausea No date: Fibromyalgia No date: Foot drop     Comment:  right - numbness, tingling - twisting No date: Gallstones No date: GERD (gastroesophageal reflux disease)     Comment:  rare zantac PRN N/V at times with this No date: GERD (gastroesophageal reflux disease) No date: Headache(784.0)     Comment:  migraines No date: Hemorrhoid No date: Hemorrhoids No date: History of kidney stones No date: Hyperlipidemia 1990s: Hypothyroidism     Comment:  hashimoto's thyroiditis No date: IBS (irritable bowel syndrome) No date: IBS (irritable bowel syndrome) No date: Incontinence of urine     Comment:  s/p sling procedure-unresolved No date: Iron deficiency     Comment:  h/o anemia, s/p iron infusion (2012) No date: Liver hemangioma     Comment:   x2 No date: Memory change     Comment:  due to medications No date: Migraines     Comment:  and h/o optical migraine No date: Migraines 2008: Mixed urge and stress incontinence     Comment:  extensive workup including urodynamics, failed miltiple               anticholinergics including myrbetriq and urogesic blue               s/p PTNS, normal cystoscopy 2014, seen by Dr Matilde Sprang  and Dr Erlene Quan 2008: Neurogenic bladder     Comment:  after 13 spine surgeries with periph neuropathy and               chronic LBP 2011: Neuropathy of lower extremity No date: Nocturia No date: Notalgia paresthetica 12/2013: Osteoarthritis No date: PONV (postoperative nausea and vomiting) 04/2012: Pulmonary nodule     Comment:  7.44mm RLL nodule - resolved on imaging 12/2012 No date: Sleep apnea     Comment:  mild-doesn't use cpap No date: Urine frequency No date: Vitamin D deficiency  Past Surgical History: No date: ANTERIOR CERVICAL DECOMP/DISCECTOMY FUSION     Comment:  C3, 4, 5 - improved after surgery 07/2017: ANTERIOR CERVICAL DECOMP/DISCECTOMY  FUSION     Comment:  C6/7 Dr Pamala Hurry No date: BACK SURGERY     Comment:  multiple-L4-s1,hardware,fusion,removal,infection s/p               5/11 procedure 02/16/11: BACK SURGERY     Comment:  "my 7th back OR; Procedure: Exploration of fusion               removal of hardware L4-S1, harvesting of right iliac               crest bone graft, redo posterior lateral fusion L4-5               using iliac crest bone graft BMP and master graft               replacement of bilateral L4 screws with a Telfa tech 7.5               x 40 mm screw on the right and 8.5 x 40 mm screw on the               left placement of a large Hemovac drain 1996; 2001: CESAREAN SECTION     Comment:  x 2 2001: CHOLECYSTECTOMY 10/2016: COLONOSCOPY     Comment:  rpt 10 yrs Gustavo Lah) 03/2019: COLONOSCOPY     Comment:  1 polyp, biopsies taken ([place]) 06/2017: ESOPHAGEAL MANOMETRY     Comment:  WNL (Dr Earnie Larsson) 03/2013: ESOPHAGOGASTRODUODENOSCOPY     Comment:  early esophageal stricture dilated, o/w WNL Deatra Ina) 12/2014: ESOPHAGOGASTRODUODENOSCOPY     Comment:  WNL, s/p empiric dilation of esophagus Henrene Pastor) 10/2015: ESOPHAGOGASTRODUODENOSCOPY     Comment:  WNL, biopsies WNL as well (minimal esophagitis with               reactive changes) Benson Norway) 10/2016: ESOPHAGOGASTRODUODENOSCOPY     Comment:  biopsy suggestive of ulcer Gustavo Lah) 03/2019: ESOPHAGOGASTRODUODENOSCOPY     Comment:  mild portal hypertensive gastropathy, gastric erosion               without bleeding, duodenal diverticulum ([place]) No date: HEMORRHOID BANDING No date: LASIK; Bilateral 03/15/2011: LUMBAR WOUND DEBRIDEMENT     Comment:  Procedure: LUMBAR WOUND DEBRIDEMENT;  Surgeon: Elaina Hoops, MD;  Lumbar Wound Debridement 1986: MANDIBLE RECONSTRUCTION 01/28/2013: PFT     Comment:  WNL 2008: PUBOVAGINAL SLING     Comment:  midurethral sling 12/18/2010: SHOULDER CLOSED REDUCTION; Left     Comment:  Procedure: CLOSED MANIPULATION  SHOULDER;  Surgeon:               Johnn Hai; labrial debridement 08/19/2015: SPINAL CORD STIMULATOR INSERTION; N/A     Comment:  Procedure: LUMBAR  SPINAL CORD STIMULATOR INSERTION;                Surgeon: Erline Levine, MD;  Location: [facility];                Service: Neurosurgery;  Laterality: N/A;  LUMBAR SPINAL               CORD STIMULATOR INSERTION No date: SPINE SURGERY 1972: TONSILLECTOMY     Comment:  as a child; "then they grew back"  BMI    Body Mass Index: 25.46 kg/m      Reproductive/Obstetrics negative OB ROS                             Anesthesia Physical Anesthesia Plan  ASA: III  Anesthesia Plan: General   Post-op Pain Management:    Induction: Intravenous  PONV Risk Score and Plan: Propofol infusion and TIVA  Airway Management Planned: Natural Airway and Nasal Cannula  Additional Equipment:   Intra-op Plan:   Post-operative Plan:   Informed Consent: I have reviewed the patients History and Physical, chart, labs and discussed the procedure including the risks, benefits and alternatives for the proposed anesthesia with the patient or authorized representative who has indicated his/her understanding and acceptance.     Dental Advisory Given  Plan Discussed with: Anesthesiologist, CRNA and Surgeon  Anesthesia Plan Comments: (Patient consented for risks of anesthesia including but not limited to:  - adverse reactions to medications - risk of intubation if required - damage to eyes, teeth, lips or other oral mucosa - nerve damage due to positioning  - sore throat or hoarseness - Damage to heart, brain, nerves, lungs, other parts of body or loss of life  Patient voiced understanding.)        Anesthesia Quick Evaluation

## 2019-09-02 NOTE — Discharge Instructions (Signed)
AMBULATORY SURGERY  DISCHARGE INSTRUCTIONS   1) The drugs that you were given will stay in your system until tomorrow so for the next 24 hours you should not:  A) Drive an automobile B) Make any legal decisions C) Drink any alcoholic beverage   2) You may resume regular meals tomorrow.  Today it is better to start with liquids and gradually work up to solid foods.  You may eat anything you prefer, but it is better to start with liquids, then soup and crackers, and gradually work up to solid foods.   3) Please notify your doctor immediately if you have any unusual bleeding, trouble breathing, redness and pain at the surgery site, drainage, fever, or pain not relieved by medication.    4) Additional Instructions:        Please contact your physician with any problems or Same Day Surgery at [phone], Monday through Friday 6 am to 4 pm, or [place] at [facility] number at [phone]. Diet: Resume home heart healthy regular diet.   Activity: Continue previous activity as tolerated, light activity and walking are encouraged. Do not drive or drink alcohol if taking narcotic pain medications.  Wound care: May shower with soapy water and pat dry (do not rub incisions), but no baths or submerging incision underwater until follow-up. (no swimming)   Medications: Resume all home medications. For mild to moderate pain: acetaminophen (Tylenol) or ibuprofen (if no kidney disease). Combining Tylenol with alcohol can substantially increase your risk of causing liver disease. Narcotic pain medications, if prescribed, can be used for severe pain, though may cause nausea, constipation, and drowsiness. Do not combine Tylenol and Norco within a 6 hour period as Norco contains Tylenol. If you do not need the narcotic pain medication, you do not need to fill the prescription.  Call office [phone]) at any time if any questions, worsening pain, fevers/chills, bleeding, drainage from  incision site, or other concerns.

## 2019-09-03 LAB — SURGICAL PATHOLOGY

## 2019-09-03 NOTE — Anesthesia Postprocedure Evaluation (Signed)
Anesthesia Post Note  Patient: Karen Aguilar  Procedure(s) Performed: BIOPSY TEMPORAL ARTERY (Left Head)  Patient location during evaluation: PACU Anesthesia Type: General Level of consciousness: awake and alert Pain management: pain level controlled Vital Signs Assessment: post-procedure vital signs reviewed and stable Respiratory status: spontaneous breathing, nonlabored ventilation, respiratory function stable and patient connected to nasal cannula oxygen Cardiovascular status: blood pressure returned to baseline and stable Postop Assessment: no apparent nausea or vomiting Anesthetic complications: no   No complications documented.   Last Vitals:  Vitals:   09/02/19 1254 09/02/19 1315  BP: 112/77 (!) 116/58  Pulse: 80 86  Resp: 19 16  Temp: (!) 36.3 C 36.4 C  SpO2: 100% 100%    Last Pain:  Vitals:   09/02/19 1315  TempSrc: Temporal  PainSc: 4                  Precious Haws Eleasha Cataldo

## 2019-09-07 ENCOUNTER — Other Ambulatory Visit: Payer: Self-pay | Admitting: Family Medicine

## 2019-09-08 NOTE — Telephone Encounter (Addendum)
Patient called in today and made lab appointment for tomorrow at 115pm.   Also, She wants to be sure Dr. Darnell Level sees her message attached here where she requests for additional labs to be ordered if needed.    Will Flag this message for PCP.

## 2019-09-09 ENCOUNTER — Other Ambulatory Visit: Payer: Self-pay

## 2019-09-09 ENCOUNTER — Other Ambulatory Visit (INDEPENDENT_AMBULATORY_CARE_PROVIDER_SITE_OTHER): Payer: Medicare Other

## 2019-09-09 DIAGNOSIS — R7401 Elevation of levels of liver transaminase levels: Secondary | ICD-10-CM

## 2019-09-09 DIAGNOSIS — M62831 Muscle spasm of calf: Secondary | ICD-10-CM | POA: Diagnosis not present

## 2019-09-09 DIAGNOSIS — R519 Headache, unspecified: Secondary | ICD-10-CM | POA: Diagnosis not present

## 2019-09-09 DIAGNOSIS — G894 Chronic pain syndrome: Secondary | ICD-10-CM | POA: Diagnosis not present

## 2019-09-09 DIAGNOSIS — M960 Pseudarthrosis after fusion or arthrodesis: Secondary | ICD-10-CM | POA: Diagnosis not present

## 2019-09-09 DIAGNOSIS — R51 Headache with orthostatic component, not elsewhere classified: Secondary | ICD-10-CM | POA: Diagnosis not present

## 2019-09-09 LAB — HEPATIC FUNCTION PANEL
ALT: 17 U/L (ref 0–35)
AST: 11 U/L (ref 0–37)
Albumin: 4.2 g/dL (ref 3.5–5.2)
Alkaline Phosphatase: 84 U/L (ref 39–117)
Bilirubin, Direct: 0.1 mg/dL (ref 0.0–0.3)
Total Bilirubin: 0.6 mg/dL (ref 0.2–1.2)
Total Protein: 6.6 g/dL (ref 6.0–8.3)

## 2019-09-09 LAB — CBC WITH DIFFERENTIAL/PLATELET
Basophils Absolute: 0 10*3/uL (ref 0.0–0.1)
Basophils Relative: 0.1 % (ref 0.0–3.0)
Eosinophils Absolute: 0 10*3/uL (ref 0.0–0.7)
Eosinophils Relative: 0 % (ref 0.0–5.0)
HCT: 43.7 % (ref 36.0–46.0)
Hemoglobin: 14.7 g/dL (ref 12.0–15.0)
Lymphocytes Relative: 24.4 % (ref 12.0–46.0)
Lymphs Abs: 1.7 10*3/uL (ref 0.7–4.0)
MCHC: 33.7 g/dL (ref 30.0–36.0)
MCV: 86.5 fl (ref 78.0–100.0)
Monocytes Absolute: 0.5 10*3/uL (ref 0.1–1.0)
Monocytes Relative: 7.2 % (ref 3.0–12.0)
Neutro Abs: 4.9 10*3/uL (ref 1.4–7.7)
Neutrophils Relative %: 68.3 % (ref 43.0–77.0)
Platelets: 310 10*3/uL (ref 150.0–400.0)
RBC: 5.06 Mil/uL (ref 3.87–5.11)
RDW: 13.3 % (ref 11.5–15.5)
WBC: 7.1 10*3/uL (ref 4.0–10.5)

## 2019-09-09 LAB — IBC PANEL
Iron: 59 ug/dL (ref 42–145)
Saturation Ratios: 16 % — ABNORMAL LOW (ref 20.0–50.0)
Transferrin: 263 mg/dL (ref 212.0–360.0)

## 2019-09-09 LAB — FERRITIN: Ferritin: 88.2 ng/mL (ref 10.0–291.0)

## 2019-09-09 LAB — TSH: TSH: 0.3 u[IU]/mL — ABNORMAL LOW (ref 0.35–4.50)

## 2019-09-09 LAB — SEDIMENTATION RATE: Sed Rate: 26 mm/hr (ref 0–30)

## 2019-09-09 MED ORDER — CLONAZEPAM 1 MG PO TABS: 1.0000 mg | ORAL_TABLET | Freq: Two times a day (BID) | ORAL | 0 refills | Status: DC | PRN

## 2019-09-09 NOTE — Telephone Encounter (Signed)
ERx 

## 2019-09-09 NOTE — Telephone Encounter (Signed)
Name of Medication: Clonazepam Name of Pharmacy: [facility] or Written Date and Quantity: 07/29/19, #60 Last Office Visit and Type: 08/26/19, migraine Next Office Visit and Type: none Last Controlled Substance Agreement Date: 06/10/13 Last UDS: 06/10/13, scanned

## 2019-09-09 NOTE — Telephone Encounter (Signed)
Spoke with patient at lab visit today.

## 2019-09-10 ENCOUNTER — Encounter: Payer: Self-pay | Admitting: Family Medicine

## 2019-09-10 ENCOUNTER — Other Ambulatory Visit: Payer: Self-pay | Admitting: Family Medicine

## 2019-09-10 DIAGNOSIS — R519 Headache, unspecified: Secondary | ICD-10-CM

## 2019-09-11 DIAGNOSIS — H04123 Dry eye syndrome of bilateral lacrimal glands: Secondary | ICD-10-CM | POA: Diagnosis not present

## 2019-09-11 LAB — HEPATITIS PANEL, ACUTE
Hep A IgM: NONREACTIVE
Hep B C IgM: NONREACTIVE
Hepatitis B Surface Ag: NONREACTIVE
Hepatitis C Ab: NONREACTIVE
SIGNAL TO CUT-OFF: 0.01 (ref <1.00)

## 2019-09-11 LAB — ANTI-NUCLEAR AB-TITER (ANA TITER): ANA Titer 1: 1:40 {titer} — ABNORMAL HIGH

## 2019-09-11 LAB — PROTEIN ELECTROPHORESIS, SERUM, WITH REFLEX
Albumin ELP: 3.9 g/dL (ref 3.8–4.8)
Alpha 1: 0.3 g/dL (ref 0.2–0.3)
Alpha 2: 0.7 g/dL (ref 0.5–0.9)
Beta 2: 0.3 g/dL (ref 0.2–0.5)
Beta Globulin: 0.4 g/dL (ref 0.4–0.6)
Gamma Globulin: 0.8 g/dL (ref 0.8–1.7)
Total Protein: 6.4 g/dL (ref 6.1–8.1)

## 2019-09-11 LAB — ANA: Anti Nuclear Antibody (ANA): POSITIVE — AB

## 2019-09-12 ENCOUNTER — Encounter: Payer: Self-pay | Admitting: Family Medicine

## 2019-09-12 NOTE — Telephone Encounter (Signed)
Replied via lab result  

## 2019-09-14 DIAGNOSIS — E559 Vitamin D deficiency, unspecified: Secondary | ICD-10-CM | POA: Diagnosis not present

## 2019-09-14 DIAGNOSIS — E611 Iron deficiency: Secondary | ICD-10-CM | POA: Diagnosis not present

## 2019-09-14 DIAGNOSIS — E519 Thiamine deficiency, unspecified: Secondary | ICD-10-CM | POA: Diagnosis not present

## 2019-09-14 DIAGNOSIS — E538 Deficiency of other specified B group vitamins: Secondary | ICD-10-CM | POA: Diagnosis not present

## 2019-09-14 DIAGNOSIS — E612 Magnesium deficiency: Secondary | ICD-10-CM | POA: Diagnosis not present

## 2019-09-17 ENCOUNTER — Encounter: Payer: Self-pay | Admitting: Family Medicine

## 2019-09-17 DIAGNOSIS — H04123 Dry eye syndrome of bilateral lacrimal glands: Secondary | ICD-10-CM | POA: Diagnosis not present

## 2019-09-18 ENCOUNTER — Other Ambulatory Visit: Payer: Self-pay | Admitting: Acute Care

## 2019-09-18 DIAGNOSIS — G379 Demyelinating disease of central nervous system, unspecified: Secondary | ICD-10-CM

## 2019-09-21 DIAGNOSIS — E038 Other specified hypothyroidism: Secondary | ICD-10-CM | POA: Diagnosis not present

## 2019-09-21 DIAGNOSIS — E063 Autoimmune thyroiditis: Secondary | ICD-10-CM | POA: Diagnosis not present

## 2019-09-21 DIAGNOSIS — E538 Deficiency of other specified B group vitamins: Secondary | ICD-10-CM | POA: Diagnosis not present

## 2019-09-29 ENCOUNTER — Other Ambulatory Visit: Payer: Self-pay | Admitting: Family Medicine

## 2019-09-29 DIAGNOSIS — G43109 Migraine with aura, not intractable, without status migrainosus: Secondary | ICD-10-CM | POA: Diagnosis not present

## 2019-09-29 DIAGNOSIS — R519 Headache, unspecified: Secondary | ICD-10-CM | POA: Diagnosis not present

## 2019-09-29 DIAGNOSIS — R768 Other specified abnormal immunological findings in serum: Secondary | ICD-10-CM | POA: Diagnosis not present

## 2019-09-29 DIAGNOSIS — K589 Irritable bowel syndrome without diarrhea: Secondary | ICD-10-CM | POA: Diagnosis not present

## 2019-09-29 DIAGNOSIS — E538 Deficiency of other specified B group vitamins: Secondary | ICD-10-CM | POA: Diagnosis not present

## 2019-09-30 NOTE — Telephone Encounter (Signed)
Name of Medication: Clonazepam Name of Pharmacy: [facility] or Written Date and Quantity: 09/09/19, #60 Last Office Visit and Type: 08/26/19, migraine Next Office Visit and Type: none Last Controlled Substance Agreement Date: 06/10/13 Last UDS: 06/10/13, scanned

## 2019-10-02 NOTE — Telephone Encounter (Signed)
ERx 

## 2019-10-06 ENCOUNTER — Ambulatory Visit
Admission: RE | Admit: 2019-10-06 | Discharge: 2019-10-06 | Disposition: A | Discharge status: Home / Self Care | Payer: Medicare Other | Source: Ambulatory Visit | Attending: Acute Care | Admitting: Acute Care

## 2019-10-06 ENCOUNTER — Other Ambulatory Visit: Payer: Self-pay

## 2019-10-06 DIAGNOSIS — R202 Paresthesia of skin: Secondary | ICD-10-CM | POA: Diagnosis not present

## 2019-10-06 DIAGNOSIS — R140 Abdominal distension (gaseous): Secondary | ICD-10-CM | POA: Diagnosis not present

## 2019-10-06 DIAGNOSIS — G379 Demyelinating disease of central nervous system, unspecified: Secondary | ICD-10-CM

## 2019-10-06 DIAGNOSIS — R945 Abnormal results of liver function studies: Secondary | ICD-10-CM | POA: Diagnosis not present

## 2019-10-06 DIAGNOSIS — R198 Other specified symptoms and signs involving the digestive system and abdomen: Secondary | ICD-10-CM | POA: Diagnosis not present

## 2019-10-06 DIAGNOSIS — R519 Headache, unspecified: Secondary | ICD-10-CM | POA: Diagnosis not present

## 2019-10-06 DIAGNOSIS — R200 Anesthesia of skin: Secondary | ICD-10-CM | POA: Diagnosis not present

## 2019-10-06 DIAGNOSIS — H538 Other visual disturbances: Secondary | ICD-10-CM | POA: Diagnosis not present

## 2019-10-06 DIAGNOSIS — E538 Deficiency of other specified B group vitamins: Secondary | ICD-10-CM | POA: Diagnosis not present

## 2019-10-06 DIAGNOSIS — R768 Other specified abnormal immunological findings in serum: Secondary | ICD-10-CM | POA: Diagnosis not present

## 2019-10-07 ENCOUNTER — Other Ambulatory Visit: Payer: Self-pay | Admitting: Family Medicine

## 2019-10-07 DIAGNOSIS — M62831 Muscle spasm of calf: Secondary | ICD-10-CM | POA: Diagnosis not present

## 2019-10-07 DIAGNOSIS — R51 Headache with orthostatic component, not elsewhere classified: Secondary | ICD-10-CM | POA: Diagnosis not present

## 2019-10-07 DIAGNOSIS — M961 Postlaminectomy syndrome, not elsewhere classified: Secondary | ICD-10-CM | POA: Diagnosis not present

## 2019-10-07 DIAGNOSIS — G894 Chronic pain syndrome: Secondary | ICD-10-CM | POA: Diagnosis not present

## 2019-10-07 NOTE — Telephone Encounter (Signed)
Confirmed with pharmacy, refill was received.  They are filling today.  Plz deny duplicate request.

## 2019-10-17 ENCOUNTER — Other Ambulatory Visit: Payer: Self-pay | Admitting: Family Medicine

## 2019-10-19 NOTE — Telephone Encounter (Signed)
ERx 

## 2019-10-26 DIAGNOSIS — R519 Headache, unspecified: Secondary | ICD-10-CM | POA: Diagnosis not present

## 2019-11-05 DIAGNOSIS — H16232 Neurotrophic keratoconjunctivitis, left eye: Secondary | ICD-10-CM | POA: Diagnosis not present

## 2019-11-17 DIAGNOSIS — Z79891 Long term (current) use of opiate analgesic: Secondary | ICD-10-CM | POA: Diagnosis not present

## 2019-11-17 DIAGNOSIS — R51 Headache with orthostatic component, not elsewhere classified: Secondary | ICD-10-CM | POA: Diagnosis not present

## 2019-11-17 DIAGNOSIS — M961 Postlaminectomy syndrome, not elsewhere classified: Secondary | ICD-10-CM | POA: Diagnosis not present

## 2019-11-17 DIAGNOSIS — G894 Chronic pain syndrome: Secondary | ICD-10-CM | POA: Diagnosis not present

## 2019-11-19 DIAGNOSIS — H16232 Neurotrophic keratoconjunctivitis, left eye: Secondary | ICD-10-CM | POA: Diagnosis not present

## 2019-11-19 DIAGNOSIS — H18892 Other specified disorders of cornea, left eye: Secondary | ICD-10-CM | POA: Diagnosis not present

## 2019-11-25 DIAGNOSIS — H16232 Neurotrophic keratoconjunctivitis, left eye: Secondary | ICD-10-CM | POA: Diagnosis not present

## 2019-12-03 DIAGNOSIS — H16232 Neurotrophic keratoconjunctivitis, left eye: Secondary | ICD-10-CM | POA: Diagnosis not present

## 2019-12-07 DIAGNOSIS — H539 Unspecified visual disturbance: Secondary | ICD-10-CM | POA: Diagnosis not present

## 2019-12-07 DIAGNOSIS — F419 Anxiety disorder, unspecified: Secondary | ICD-10-CM | POA: Diagnosis not present

## 2019-12-07 DIAGNOSIS — R519 Headache, unspecified: Secondary | ICD-10-CM | POA: Diagnosis not present

## 2019-12-08 DIAGNOSIS — E038 Other specified hypothyroidism: Secondary | ICD-10-CM | POA: Diagnosis not present

## 2019-12-08 DIAGNOSIS — E063 Autoimmune thyroiditis: Secondary | ICD-10-CM | POA: Diagnosis not present

## 2019-12-15 DIAGNOSIS — E038 Other specified hypothyroidism: Secondary | ICD-10-CM | POA: Diagnosis not present

## 2019-12-15 DIAGNOSIS — E063 Autoimmune thyroiditis: Secondary | ICD-10-CM | POA: Diagnosis not present

## 2019-12-22 DIAGNOSIS — R1011 Right upper quadrant pain: Secondary | ICD-10-CM | POA: Diagnosis not present

## 2019-12-22 DIAGNOSIS — R198 Other specified symptoms and signs involving the digestive system and abdomen: Secondary | ICD-10-CM | POA: Diagnosis not present

## 2019-12-28 DIAGNOSIS — G509 Disorder of trigeminal nerve, unspecified: Secondary | ICD-10-CM | POA: Diagnosis not present

## 2019-12-28 DIAGNOSIS — G500 Trigeminal neuralgia: Secondary | ICD-10-CM | POA: Diagnosis not present

## 2019-12-28 DIAGNOSIS — R519 Headache, unspecified: Secondary | ICD-10-CM | POA: Diagnosis not present

## 2019-12-28 DIAGNOSIS — M7918 Myalgia, other site: Secondary | ICD-10-CM | POA: Diagnosis not present

## 2019-12-29 DIAGNOSIS — Z79891 Long term (current) use of opiate analgesic: Secondary | ICD-10-CM | POA: Diagnosis not present

## 2019-12-29 DIAGNOSIS — R51 Headache with orthostatic component, not elsewhere classified: Secondary | ICD-10-CM | POA: Diagnosis not present

## 2019-12-29 DIAGNOSIS — M961 Postlaminectomy syndrome, not elsewhere classified: Secondary | ICD-10-CM | POA: Diagnosis not present

## 2019-12-29 DIAGNOSIS — G894 Chronic pain syndrome: Secondary | ICD-10-CM | POA: Diagnosis not present

## 2020-01-19 DIAGNOSIS — H16232 Neurotrophic keratoconjunctivitis, left eye: Secondary | ICD-10-CM | POA: Diagnosis not present

## 2020-01-19 DIAGNOSIS — G509 Disorder of trigeminal nerve, unspecified: Secondary | ICD-10-CM | POA: Diagnosis not present

## 2020-01-19 DIAGNOSIS — R200 Anesthesia of skin: Secondary | ICD-10-CM | POA: Diagnosis not present

## 2020-01-20 DIAGNOSIS — G509 Disorder of trigeminal nerve, unspecified: Secondary | ICD-10-CM | POA: Diagnosis not present

## 2020-02-04 DIAGNOSIS — R198 Other specified symptoms and signs involving the digestive system and abdomen: Secondary | ICD-10-CM | POA: Diagnosis not present

## 2020-02-04 DIAGNOSIS — E538 Deficiency of other specified B group vitamins: Secondary | ICD-10-CM | POA: Diagnosis not present

## 2020-02-04 DIAGNOSIS — R140 Abdominal distension (gaseous): Secondary | ICD-10-CM | POA: Diagnosis not present

## 2020-02-08 DIAGNOSIS — H539 Unspecified visual disturbance: Secondary | ICD-10-CM | POA: Diagnosis not present

## 2020-02-08 DIAGNOSIS — R519 Headache, unspecified: Secondary | ICD-10-CM | POA: Diagnosis not present

## 2020-02-08 DIAGNOSIS — F419 Anxiety disorder, unspecified: Secondary | ICD-10-CM | POA: Diagnosis not present

## 2020-02-09 ENCOUNTER — Encounter: Payer: Self-pay | Admitting: Family Medicine

## 2020-02-09 ENCOUNTER — Other Ambulatory Visit: Payer: Self-pay

## 2020-02-09 ENCOUNTER — Ambulatory Visit (INDEPENDENT_AMBULATORY_CARE_PROVIDER_SITE_OTHER): Payer: Medicare Other | Admitting: Family Medicine

## 2020-02-09 VITALS — BP 112/80 | HR 93 | Temp 97.7°F | Ht 68.0 in | Wt 165.0 lb

## 2020-02-09 DIAGNOSIS — G894 Chronic pain syndrome: Secondary | ICD-10-CM | POA: Diagnosis not present

## 2020-02-09 DIAGNOSIS — K3184 Gastroparesis: Secondary | ICD-10-CM

## 2020-02-09 DIAGNOSIS — Z79891 Long term (current) use of opiate analgesic: Secondary | ICD-10-CM | POA: Diagnosis not present

## 2020-02-09 DIAGNOSIS — R110 Nausea: Secondary | ICD-10-CM | POA: Diagnosis not present

## 2020-02-09 DIAGNOSIS — E039 Hypothyroidism, unspecified: Secondary | ICD-10-CM

## 2020-02-09 DIAGNOSIS — R51 Headache with orthostatic component, not elsewhere classified: Secondary | ICD-10-CM | POA: Diagnosis not present

## 2020-02-09 DIAGNOSIS — R591 Generalized enlarged lymph nodes: Secondary | ICD-10-CM | POA: Diagnosis not present

## 2020-02-09 DIAGNOSIS — R1310 Dysphagia, unspecified: Secondary | ICD-10-CM

## 2020-02-09 DIAGNOSIS — H04123 Dry eye syndrome of bilateral lacrimal glands: Secondary | ICD-10-CM

## 2020-02-09 DIAGNOSIS — K219 Gastro-esophageal reflux disease without esophagitis: Secondary | ICD-10-CM

## 2020-02-09 DIAGNOSIS — G43109 Migraine with aura, not intractable, without status migrainosus: Secondary | ICD-10-CM

## 2020-02-09 DIAGNOSIS — M961 Postlaminectomy syndrome, not elsewhere classified: Secondary | ICD-10-CM | POA: Diagnosis not present

## 2020-02-09 MED ORDER — OMEPRAZOLE 40 MG PO CPDR: 40.0000 mg | DELAYED_RELEASE_CAPSULE | Freq: Every day | ORAL | 1 refills | Status: DC

## 2020-02-09 MED ORDER — ONDANSETRON 4 MG PO TBDP
4.0000 mg | ORAL_TABLET | Freq: Once | ORAL | Status: AC
  Administered 2020-02-09: 4 mg via ORAL

## 2020-02-09 MED ORDER — LORAZEPAM 1 MG PO TABS: 0.5000 mg | ORAL_TABLET | Freq: Two times a day (BID) | ORAL | 0 refills | Status: DC | PRN

## 2020-02-09 NOTE — Progress Notes (Unsigned)
Patient ID: Karen Aguilar, female    DOB: [birth year], 55 y.o.   MRN: [id number]  This visit was conducted in person.  BP 112/80 (BP Location: Right Arm, Patient Position: Sitting, Cuff Size: Normal)   Pulse 93   Temp 97.7 F (36.5 C) (Temporal)   Ht 5\' 8"  (1.727 m)   Wt 165 lb (74.8 kg)   LMP 10/04/2014 Comment: per labs  SpO2 97%   BMI 25.09 kg/m    CC: check lump in throat Subjective:   HPI: Karen Aguilar is a 55 y.o. female presenting on 02/09/2020 for Mass (C/o lump in her throat.  Noticed about 2 wks ago.  Also, c/o trouble swallowing and a feeling of gagging. ) and Discuss Medication (Wants to discuss clonazepam. )   2 mo h/o increased gagging, trouble swallowing medications. 2 wks ago noted lump to left throat. No recent immunizations or URI symptoms. Globus sensation to throat.some dysphagia to firm solids, does ok with liquids. No unexpected weight changes, no early satiety. Notes some increased heartburn and burping. No significant allergic rhinitis besides occasional sneezing fits - no itchy eyes or runny nose or congestion. Chronic GI issues, abd pain, nausea. Pain with chewing due to trigeminal neuralgia.   She continues nightly pepcid.  Armour thyroid 60mg  recently reduced to 1 tablet daily, 2 tablets twice weekly ([facility]).  She stopped baclofen 10mg  due to side effects but may consider restarting at lower dose.   Yesterday saw neurology Dr Melrose Nakayama, tegretol dose recently increased for trigeminal neuralgia/headache. Last night first dose - with increased nausea noted this morning. Wonders if med related. Has had trouble tolerating meds to date - trileptal GI upset and now possibly tegretol.   Notes increasing dry eyes L>R eye. Thinks clonazepam may be worsening symptoms - she's cut down to 1/2 tab BID. She just ran out of klonopin today (last filled 10/2019). Did recently see neuro ophthalmologist at [facility] last month. Planning to see rheum through [facility].      Relevant  past medical, surgical, family and social history reviewed and updated as indicated. Interim medical history since our last visit reviewed. Allergies and medications reviewed and updated. Outpatient Medications Prior to Visit  Medication Sig Dispense Refill  . Ascorbic Acid (VITAMIN C PO) Take 1 tablet by mouth as needed (for immune system support).     . calcium carbonate (TUMS EX) 750 MG chewable tablet Chew 2-3 tablets by mouth 2 (two) times daily.    . camphor-menthol (SARNA) lotion Apply 1 application topically at bedtime as needed for itching.     . carbamazepine (TEGRETOL) 200 MG tablet Take by mouth.    . Cholecalciferol (VITAMIN D) 2000 units CAPS Take 1 capsule (2,000 Units total) by mouth daily. 30 capsule   . clobetasol ointment (TEMOVATE) 6.78 % Apply 1 application topically as needed (for eczema).    . diclofenac sodium (VOLTAREN) 1 % GEL APPLY THIN LAYER EXTERNALLY TO THE AFFECTED AREA UP TO FOUR TIMES DAILY AS NEEDED 300 g 0  . docusate sodium (COLACE) 100 MG capsule Take 100 mg by mouth 2 (two) times daily. As needed    . hyoscyamine (LEVSIN SL) 0.125 MG SL tablet DISSOLVE 1 TABLET(0.125 MG) UNDER THE TONGUE EVERY 4 HOURS AS NEEDED 30 tablet 3  . metroNIDAZOLE (METROGEL) 0.75 % gel Apply 1 application topically 2 (two) times daily. 45 g 3  . NUCYNTA 100 MG TABS Take 1 tablet by mouth every 6 (six) hours.  0  .  pediatric multivitamin-iron (POLY-VI-SOL WITH IRON) 15 MG chewable tablet Chew 1 tablet by mouth daily.    Marland Kitchen Phenylephrine-DM-GG-APAP 5-10-200-325 MG CAPS Take 2 tablets by mouth 2 (two) times daily as needed (for migraine).    Vladimir Faster Glycol-Propyl Glycol (SYSTANE FREE OP) Apply to eye.    . predniSONE (DELTASONE) 20 MG tablet Take 3 tablets (60 mg total) by mouth daily with breakfast. 60 tablet 0  . promethazine (PHENERGAN) 25 MG tablet Take 1 tablet (25 mg total) by mouth every 8 (eight) hours as needed for nausea or vomiting. 30 tablet 0  . sucralfate (CARAFATE) 1 g  tablet Take 1 tablet (1 g total) by mouth 2 (two) times daily before a meal. As needed 120 tablet 0  . thyroid (ARMOUR) 60 MG tablet Take 60 mg by mouth daily before breakfast. Two days a week take extra tablet daily    . clonazePAM (KLONOPIN) 1 MG tablet TAKE 1 TABLET BY MOUTH TWICE DAILY AS NEEDED ANXIETY (Patient taking differently: Takes 1/2 tablet twice a day) 60 tablet 0  . famotidine (PEPCID) 40 MG tablet Take 40 mg by mouth daily.    . famotidine (PEPCID) 40 MG tablet Take 1 tablet (40 mg total) by mouth at bedtime.    . baclofen (LIORESAL) 10 MG tablet Take 1 tablet (10 mg total) by mouth 3 (three) times daily.    Marland Kitchen dronabinol (MARINOL) 2.5 MG capsule Take 2.5 mg by mouth daily as needed (for nausea and pain).  (Patient not taking: Reported on 09/02/2019)  0  . IRON PO Take 20 mg by mouth every other day. (Patient not taking: Reported on 09/02/2019)    . NON FORMULARY Place 1 drop under the tongue 3 (three) times daily. Hemp oil as needed (Patient not taking: Reported on 08/26/2019)    . topiramate (TOPAMAX) 50 MG tablet Take 1 tablet (50 mg total) by mouth at bedtime. 30 tablet 3   No facility-administered medications prior to visit.     Per HPI unless specifically indicated in ROS section below Review of Systems Objective:  BP 112/80 (BP Location: Right Arm, Patient Position: Sitting, Cuff Size: Normal)   Pulse 93   Temp 97.7 F (36.5 C) (Temporal)   Ht 5\' 8"  (1.727 m)   Wt 165 lb (74.8 kg)   LMP 10/04/2014 Comment: per labs  SpO2 97%   BMI 25.09 kg/m   Wt Readings from Last 3 Encounters:  02/09/20 165 lb (74.8 kg)  09/02/19 167 lb 7 oz (75.9 kg)  08/26/19 167 lb 7 oz (75.9 kg)      Physical Exam Vitals and nursing note reviewed.  Constitutional:      Appearance: Normal appearance. She is not ill-appearing.  HENT:     Mouth/Throat:     Lips: Pink. No lesions.     Mouth: Mucous membranes are moist. No oral lesions.     Dentition: Normal dentition.     Tongue: No  lesions.     Palate: No mass.     Pharynx: Oropharynx is clear. No pharyngeal swelling, oropharyngeal exudate, posterior oropharyngeal erythema or uvula swelling.  Eyes:     Extraocular Movements: Extraocular movements intact.     Pupils: Pupils are equal, round, and reactive to light.  Neck:     Thyroid: No thyroid mass, thyromegaly or thyroid tenderness.  Cardiovascular:     Rate and Rhythm: Normal rate and regular rhythm.     Pulses: Normal pulses.     Heart sounds: Normal  heart sounds. No murmur heard.   Pulmonary:     Effort: Pulmonary effort is normal. No respiratory distress.     Breath sounds: Normal breath sounds. No wheezing, rhonchi or rales.  Chest:  Breasts:     Right: No supraclavicular adenopathy.     Left: No supraclavicular adenopathy.    Musculoskeletal:     Cervical back: Normal range of motion and neck supple.     Right lower leg: No edema.     Left lower leg: No edema.  Lymphadenopathy:     Head:     Right side of head: No submental, submandibular, tonsillar, preauricular or posterior auricular adenopathy.     Left side of head: No submental, submandibular, tonsillar, preauricular or posterior auricular adenopathy.     Cervical: Cervical adenopathy present.     Right cervical: Deep cervical adenopathy (~1cm L sided, tender) present. No superficial cervical adenopathy.    Left cervical: No superficial or deep cervical adenopathy.     Upper Body:     Right upper body: No supraclavicular adenopathy.     Left upper body: No supraclavicular adenopathy.  Skin:    General: Skin is warm and dry.     Findings: No rash.  Neurological:     Mental Status: She is alert.  Psychiatric:        Mood and Affect: Mood normal.        Behavior: Behavior normal.       Lab Results  Component Value Date   TSH 0.30 (L) 09/09/2019    Assessment & Plan:  This visit occurred during the SARS-CoV-2 public health emergency.  Safety protocols were in place, including  screening questions prior to the visit, additional usage of staff PPE, and extensive cleaning of exam room while observing appropriate contact time as indicated for disinfecting solutions.   Problem List Items Addressed This Visit    Lymphadenopathy of head and neck    Small palpable cervical lymph node on left of 2 wks duration.  No other lymph nodes palpated. Anticipate transient LAD. Will watch for now, if persists after another 1-2 wks, will have her return for labs and possible imaging study. She agrees with plan.       Hypothyroidism    Managed through endo ([facility]). Dose decreased recently.       GERD (gastroesophageal reflux disease)    Managed with nightly pepcid. GERD could contribute to endorsed dysphagia with globus sensation - Rx omeprazole 40mg  daily x 3 wks and update with effect.  She has GI f/u planned in 2 months.       Relevant Medications   famotidine (PEPCID) 40 MG tablet   omeprazole (PRILOSEC) 40 MG capsule   Gastroparesis    Longstanding h/o this.  Worsening nausea after increasing tegretol dose, provided with zofran in office today.       Dysphagia - Primary    Previous h/o esophageal dysphagia, now describes more oropharyngeal dysphagia. If PPI not helpful, could consider swallow study, MBS through speech pathology.       Dry eyes    Endorses dry eyes L>R possibly worse by clonazepam - which has xerophthalmia listed as possible side effect.  Will trial lorazepam hopeful for less dry eye with this benzo.  She also is planning to see [facility] rheumatology for further evaluation.       Complicated migraine    Followed by neuro.       Relevant Medications   carbamazepine (TEGRETOL) 200 MG tablet  Other Visit Diagnoses    Nausea       Relevant Medications   ondansetron (ZOFRAN-ODT) disintegrating tablet 4 mg (Completed)       Meds ordered this encounter  Medications  . LORazepam (ATIVAN) 1 MG tablet    Sig: Take 0.5-1 tablets (0.5-1 mg total) by  mouth 2 (two) times daily as needed for anxiety.    Dispense:  60 tablet    Refill:  0    To replace clonazepam  . omeprazole (PRILOSEC) 40 MG capsule    Sig: Take 1 capsule (40 mg total) by mouth daily. For 3 weeks then as needed    Dispense:  30 capsule    Refill:  1  . ondansetron (ZOFRAN-ODT) disintegrating tablet 4 mg   No orders of the defined types were placed in this encounter.   Patient Instructions  zofran for nausea today  Possible lymph node on left which should go away on its own - let's give it some time, if persistent after 1-2 weeks let me know for labwork and imaging study of neck.  For trouble swallowing, start omeprazole 40mg  daily for 3 weeks then as needed. If ongoing, let me know to consider swallow study. Touch base with GI at upcoming appointment 04/2020.  Let's change klonopin to lorazepam - take 1/2-1 tablet twice daily, let me know how you do on this. See if this helps with dry eye.   Follow up plan: No follow-ups on file.  Ria Bush, MD

## 2020-02-09 NOTE — Patient Instructions (Addendum)
zofran for nausea today  Possible lymph node on left which should go away on its own - let's give it some time, if persistent after 1-2 weeks let me know for labwork and imaging study of neck.  For trouble swallowing, start omeprazole 40mg  daily for 3 weeks then as needed. If ongoing, let me know to consider swallow study. Touch base with GI at upcoming appointment 04/2020.  Let's change klonopin to lorazepam - take 1/2-1 tablet twice daily, let me know how you do on this. See if this helps with dry eye.

## 2020-02-10 ENCOUNTER — Telehealth: Payer: Self-pay

## 2020-02-10 DIAGNOSIS — R591 Generalized enlarged lymph nodes: Secondary | ICD-10-CM | POA: Insufficient documentation

## 2020-02-10 DIAGNOSIS — H04123 Dry eye syndrome of bilateral lacrimal glands: Secondary | ICD-10-CM | POA: Insufficient documentation

## 2020-02-10 NOTE — Telephone Encounter (Signed)
PA has been started in covermymeds for lorazepam 1 mg tabs. Key: [id number]. Awaiting determination.

## 2020-02-10 NOTE — Assessment & Plan Note (Signed)
Previous h/o esophageal dysphagia, now describes more oropharyngeal dysphagia. If PPI not helpful, could consider swallow study, MBS through speech pathology.

## 2020-02-10 NOTE — Assessment & Plan Note (Signed)
Managed with nightly pepcid. GERD could contribute to endorsed dysphagia with globus sensation - Rx omeprazole 40mg  daily x 3 wks and update with effect.  She has GI f/u planned in 2 months.

## 2020-02-10 NOTE — Assessment & Plan Note (Signed)
Endorses dry eyes L>R possibly worse by clonazepam - which has xerophthalmia listed as possible side effect.  Will trial lorazepam hopeful for less dry eye with this benzo.  She also is planning to see [facility] rheumatology for further evaluation.

## 2020-02-10 NOTE — Assessment & Plan Note (Signed)
Managed through endo ([facility]). Dose decreased recently.

## 2020-02-10 NOTE — Assessment & Plan Note (Signed)
Followed by neuro 

## 2020-02-10 NOTE — Assessment & Plan Note (Signed)
Longstanding h/o this.  Worsening nausea after increasing tegretol dose, provided with zofran in office today.

## 2020-02-10 NOTE — Telephone Encounter (Signed)
PA has been approved from 02/10/2020 to 02/09/2021

## 2020-02-10 NOTE — Assessment & Plan Note (Signed)
Small palpable cervical lymph node on left of 2 wks duration.  No other lymph nodes palpated. Anticipate transient LAD. Will watch for now, if persists after another 1-2 wks, will have her return for labs and possible imaging study. She agrees with plan.

## 2020-02-11 NOTE — Telephone Encounter (Signed)
Spoke with [facility] making them aware of PA approval.  States pt paid out of pocket for med on 02/09/19 but they will document approval and dates for next refill.

## 2020-02-17 DIAGNOSIS — Z6824 Body mass index (BMI) 24.0-24.9, adult: Secondary | ICD-10-CM | POA: Diagnosis not present

## 2020-02-17 DIAGNOSIS — K1379 Other lesions of oral mucosa: Secondary | ICD-10-CM | POA: Diagnosis not present

## 2020-02-17 DIAGNOSIS — G509 Disorder of trigeminal nerve, unspecified: Secondary | ICD-10-CM | POA: Diagnosis not present

## 2020-02-17 DIAGNOSIS — L98498 Non-pressure chronic ulcer of skin of other sites with other specified severity: Secondary | ICD-10-CM | POA: Diagnosis not present

## 2020-02-17 DIAGNOSIS — Z79899 Other long term (current) drug therapy: Secondary | ICD-10-CM | POA: Diagnosis not present

## 2020-02-17 DIAGNOSIS — G500 Trigeminal neuralgia: Secondary | ICD-10-CM | POA: Diagnosis not present

## 2020-02-17 DIAGNOSIS — R768 Other specified abnormal immunological findings in serum: Secondary | ICD-10-CM | POA: Diagnosis not present

## 2020-02-17 DIAGNOSIS — R634 Abnormal weight loss: Secondary | ICD-10-CM | POA: Diagnosis not present

## 2020-02-17 DIAGNOSIS — R519 Headache, unspecified: Secondary | ICD-10-CM | POA: Diagnosis not present

## 2020-02-22 ENCOUNTER — Encounter: Payer: Self-pay | Admitting: Family Medicine

## 2020-02-22 DIAGNOSIS — K1379 Other lesions of oral mucosa: Secondary | ICD-10-CM | POA: Insufficient documentation

## 2020-03-14 ENCOUNTER — Other Ambulatory Visit: Payer: Self-pay | Admitting: Family Medicine

## 2020-03-14 DIAGNOSIS — K121 Other forms of stomatitis: Secondary | ICD-10-CM | POA: Diagnosis not present

## 2020-03-14 DIAGNOSIS — E063 Autoimmune thyroiditis: Secondary | ICD-10-CM | POA: Diagnosis not present

## 2020-03-14 DIAGNOSIS — Z8349 Family history of other endocrine, nutritional and metabolic diseases: Secondary | ICD-10-CM | POA: Diagnosis not present

## 2020-03-14 DIAGNOSIS — Z79899 Other long term (current) drug therapy: Secondary | ICD-10-CM | POA: Diagnosis not present

## 2020-03-15 NOTE — Telephone Encounter (Signed)
Name of Medication: Lorazepam Name of Pharmacy: [facility] or Written Date and Quantity: 02/09/20, #60 Last Office Visit and Type: 02/09/20, dysphagia Next Office Visit and Type: none Last Controlled Substance Agreement Date: 06/10/13 Last UDS: 12/10/13

## 2020-03-17 ENCOUNTER — Other Ambulatory Visit: Payer: Self-pay | Admitting: Family Medicine

## 2020-03-18 DIAGNOSIS — G509 Disorder of trigeminal nerve, unspecified: Secondary | ICD-10-CM | POA: Diagnosis not present

## 2020-03-18 DIAGNOSIS — R590 Localized enlarged lymph nodes: Secondary | ICD-10-CM | POA: Diagnosis not present

## 2020-03-18 DIAGNOSIS — R519 Headache, unspecified: Secondary | ICD-10-CM | POA: Diagnosis not present

## 2020-03-18 DIAGNOSIS — R1310 Dysphagia, unspecified: Secondary | ICD-10-CM | POA: Diagnosis not present

## 2020-03-18 MED ORDER — LORAZEPAM 1 MG PO TABS
0.5000 mg | ORAL_TABLET | Freq: Two times a day (BID) | ORAL | 0 refills | Status: DC | PRN
Start: 2020-03-18 — End: 2020-04-26

## 2020-03-18 NOTE — Telephone Encounter (Signed)
ERx 

## 2020-03-23 DIAGNOSIS — M961 Postlaminectomy syndrome, not elsewhere classified: Secondary | ICD-10-CM | POA: Diagnosis not present

## 2020-03-23 DIAGNOSIS — R51 Headache with orthostatic component, not elsewhere classified: Secondary | ICD-10-CM | POA: Diagnosis not present

## 2020-03-23 DIAGNOSIS — G894 Chronic pain syndrome: Secondary | ICD-10-CM | POA: Diagnosis not present

## 2020-03-23 DIAGNOSIS — Z79891 Long term (current) use of opiate analgesic: Secondary | ICD-10-CM | POA: Diagnosis not present

## 2020-03-24 DIAGNOSIS — H18892 Other specified disorders of cornea, left eye: Secondary | ICD-10-CM | POA: Diagnosis not present

## 2020-04-05 DIAGNOSIS — R198 Other specified symptoms and signs involving the digestive system and abdomen: Secondary | ICD-10-CM | POA: Diagnosis not present

## 2020-04-05 DIAGNOSIS — R140 Abdominal distension (gaseous): Secondary | ICD-10-CM | POA: Diagnosis not present

## 2020-04-05 DIAGNOSIS — R12 Heartburn: Secondary | ICD-10-CM | POA: Diagnosis not present

## 2020-04-07 DIAGNOSIS — E063 Autoimmune thyroiditis: Secondary | ICD-10-CM | POA: Diagnosis not present

## 2020-04-07 DIAGNOSIS — E038 Other specified hypothyroidism: Secondary | ICD-10-CM | POA: Diagnosis not present

## 2020-04-13 ENCOUNTER — Encounter: Payer: Self-pay | Admitting: Family Medicine

## 2020-04-13 ENCOUNTER — Ambulatory Visit (INDEPENDENT_AMBULATORY_CARE_PROVIDER_SITE_OTHER): Payer: Medicare Other | Admitting: Family Medicine

## 2020-04-13 ENCOUNTER — Other Ambulatory Visit: Payer: Self-pay

## 2020-04-13 VITALS — BP 116/78 | HR 113 | Temp 97.9°F | Ht 68.0 in | Wt 164.2 lb

## 2020-04-13 DIAGNOSIS — E038 Other specified hypothyroidism: Secondary | ICD-10-CM | POA: Diagnosis not present

## 2020-04-13 DIAGNOSIS — K1379 Other lesions of oral mucosa: Secondary | ICD-10-CM

## 2020-04-13 DIAGNOSIS — H04123 Dry eye syndrome of bilateral lacrimal glands: Secondary | ICD-10-CM | POA: Diagnosis not present

## 2020-04-13 DIAGNOSIS — E063 Autoimmune thyroiditis: Secondary | ICD-10-CM | POA: Diagnosis not present

## 2020-04-13 DIAGNOSIS — R591 Generalized enlarged lymph nodes: Secondary | ICD-10-CM

## 2020-04-13 DIAGNOSIS — T280XXA Burn of mouth and pharynx, initial encounter: Secondary | ICD-10-CM | POA: Diagnosis not present

## 2020-04-13 MED ORDER — TRIAMCINOLONE ACETONIDE 0.1 % MT PSTE: 1.0000 "application " | PASTE | Freq: Two times a day (BID) | OROMUCOSAL | 0 refills | Status: DC
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
## 2020-04-13 NOTE — Patient Instructions (Addendum)
Touch base with rheum about getting scheduled for cervical lymph node biopsy.  For mouth - try triamcinolone paste along with the numbing cream - should help symptoms of pain. Continue saline rinses to area.

## 2020-04-16 DIAGNOSIS — T280XXA Burn of mouth and pharynx, initial encounter: Secondary | ICD-10-CM | POA: Insufficient documentation
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
## 2020-04-16 NOTE — Assessment & Plan Note (Signed)
Note some improvement with benzo switch - continue lorazepam for now.

## 2020-04-16 NOTE — Assessment & Plan Note (Addendum)
Bilateral cervical LAD apparently present on prior neck CT 10/2016 as well as newly noted tender presumed R inguinal LAD without rash or vaginal discharge. Reasonable to proceed with cervical LN biopsy as per [facility] rheum rec.
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
## 2020-04-21 DIAGNOSIS — H16232 Neurotrophic keratoconjunctivitis, left eye: Secondary | ICD-10-CM | POA: Diagnosis not present

## 2020-04-22 DIAGNOSIS — L040 Acute lymphadenitis of face, head and neck: Secondary | ICD-10-CM | POA: Diagnosis not present

## 2020-04-22 DIAGNOSIS — G500 Trigeminal neuralgia: Secondary | ICD-10-CM | POA: Diagnosis not present

## 2020-04-22 DIAGNOSIS — G509 Disorder of trigeminal nerve, unspecified: Secondary | ICD-10-CM | POA: Diagnosis not present

## 2020-04-25 ENCOUNTER — Other Ambulatory Visit: Payer: Self-pay | Admitting: Family Medicine

## 2020-04-25 NOTE — Telephone Encounter (Signed)
Refill request 03/18/20 #60 Last office visit 04/13/20

## 2020-04-26 ENCOUNTER — Other Ambulatory Visit: Payer: Self-pay | Admitting: Family Medicine

## 2020-04-26 NOTE — Telephone Encounter (Signed)
ERx 

## 2020-05-05 DIAGNOSIS — G894 Chronic pain syndrome: Secondary | ICD-10-CM | POA: Diagnosis not present

## 2020-05-05 DIAGNOSIS — R51 Headache with orthostatic component, not elsewhere classified: Secondary | ICD-10-CM | POA: Diagnosis not present

## 2020-05-05 DIAGNOSIS — M961 Postlaminectomy syndrome, not elsewhere classified: Secondary | ICD-10-CM | POA: Diagnosis not present

## 2020-05-05 DIAGNOSIS — Z79891 Long term (current) use of opiate analgesic: Secondary | ICD-10-CM | POA: Diagnosis not present

## 2020-05-11 DIAGNOSIS — H16232 Neurotrophic keratoconjunctivitis, left eye: Secondary | ICD-10-CM | POA: Diagnosis not present

## 2020-05-25 ENCOUNTER — Other Ambulatory Visit (INDEPENDENT_AMBULATORY_CARE_PROVIDER_SITE_OTHER): Payer: Medicare Other

## 2020-05-25 ENCOUNTER — Other Ambulatory Visit: Payer: Self-pay | Admitting: Family Medicine

## 2020-05-25 ENCOUNTER — Other Ambulatory Visit: Payer: Self-pay

## 2020-05-25 DIAGNOSIS — E7800 Pure hypercholesterolemia, unspecified: Secondary | ICD-10-CM | POA: Diagnosis not present

## 2020-05-25 DIAGNOSIS — E611 Iron deficiency: Secondary | ICD-10-CM

## 2020-05-25 DIAGNOSIS — R200 Anesthesia of skin: Secondary | ICD-10-CM

## 2020-05-25 DIAGNOSIS — E559 Vitamin D deficiency, unspecified: Secondary | ICD-10-CM

## 2020-05-25 DIAGNOSIS — E039 Hypothyroidism, unspecified: Secondary | ICD-10-CM

## 2020-05-25 LAB — CBC WITH DIFFERENTIAL/PLATELET
Basophils Absolute: 0 10*3/uL (ref 0.0–0.1)
Basophils Relative: 0 % (ref 0.0–3.0)
Eosinophils Absolute: 0 10*3/uL (ref 0.0–0.7)
Eosinophils Relative: 0 % (ref 0.0–5.0)
HCT: 39.5 % (ref 36.0–46.0)
Hemoglobin: 13.7 g/dL (ref 12.0–15.0)
Lymphocytes Relative: 33.5 % (ref 12.0–46.0)
Lymphs Abs: 1.8 10*3/uL (ref 0.7–4.0)
MCHC: 34.8 g/dL (ref 30.0–36.0)
MCV: 85.7 fl (ref 78.0–100.0)
Monocytes Absolute: 0.3 10*3/uL (ref 0.1–1.0)
Monocytes Relative: 5.9 % (ref 3.0–12.0)
Neutro Abs: 3.2 10*3/uL (ref 1.4–7.7)
Neutrophils Relative %: 60.6 % (ref 43.0–77.0)
Platelets: 276 10*3/uL (ref 150.0–400.0)
RBC: 4.61 Mil/uL (ref 3.87–5.11)
RDW: 13 % (ref 11.5–15.5)
WBC: 5.2 10*3/uL (ref 4.0–10.5)

## 2020-05-25 LAB — TSH: TSH: 15.55 u[IU]/mL — ABNORMAL HIGH (ref 0.35–4.50)

## 2020-05-25 LAB — IBC PANEL
Iron: 34 ug/dL — ABNORMAL LOW (ref 42–145)
Saturation Ratios: 8.2 % — ABNORMAL LOW (ref 20.0–50.0)
Transferrin: 295 mg/dL (ref 212.0–360.0)

## 2020-05-25 LAB — COMPREHENSIVE METABOLIC PANEL
ALT: 11 U/L (ref 0–35)
AST: 11 U/L (ref 0–37)
Albumin: 4.2 g/dL (ref 3.5–5.2)
Alkaline Phosphatase: 79 U/L (ref 39–117)
BUN: 21 mg/dL (ref 6–23)
CO2: 34 mEq/L — ABNORMAL HIGH (ref 19–32)
Calcium: 9.6 mg/dL (ref 8.4–10.5)
Chloride: 101 mEq/L (ref 96–112)
Creatinine, Ser: 0.86 mg/dL (ref 0.40–1.20)
GFR: 76.78 mL/min (ref >60.00)
Glucose, Bld: 105 mg/dL — ABNORMAL HIGH (ref 70–99)
Potassium: 4.2 mEq/L (ref 3.5–5.1)
Sodium: 141 mEq/L (ref 135–145)
Total Bilirubin: 0.3 mg/dL (ref 0.2–1.2)
Total Protein: 6.5 g/dL (ref 6.0–8.3)

## 2020-05-25 LAB — LIPID PANEL
Cholesterol: 248 mg/dL — ABNORMAL HIGH (ref 0–200)
HDL: 58 mg/dL (ref >39.00)
LDL Cholesterol: 169 mg/dL — ABNORMAL HIGH (ref 0–99)
NonHDL: 189.89
Total CHOL/HDL Ratio: 4
Triglycerides: 105 mg/dL (ref 0.0–149.0)
VLDL: 21 mg/dL (ref 0.0–40.0)

## 2020-05-25 LAB — FERRITIN: Ferritin: 29.2 ng/mL (ref 10.0–291.0)

## 2020-05-25 LAB — T4, FREE: Free T4: 0.47 ng/dL — ABNORMAL LOW (ref 0.60–1.60)

## 2020-05-25 LAB — VITAMIN B12: Vitamin B-12: 656 pg/mL (ref 211–911)

## 2020-05-25 LAB — VITAMIN D 25 HYDROXY (VIT D DEFICIENCY, FRACTURES): VITD: 26.9 ng/mL — ABNORMAL LOW (ref 30.00–100.00)

## 2020-05-26 DIAGNOSIS — Z79899 Other long term (current) drug therapy: Secondary | ICD-10-CM | POA: Diagnosis not present

## 2020-05-26 DIAGNOSIS — K219 Gastro-esophageal reflux disease without esophagitis: Secondary | ICD-10-CM | POA: Diagnosis not present

## 2020-05-26 DIAGNOSIS — M069 Rheumatoid arthritis, unspecified: Secondary | ICD-10-CM | POA: Diagnosis not present

## 2020-05-26 DIAGNOSIS — E785 Hyperlipidemia, unspecified: Secondary | ICD-10-CM | POA: Diagnosis not present

## 2020-05-26 DIAGNOSIS — R590 Localized enlarged lymph nodes: Secondary | ICD-10-CM | POA: Diagnosis not present

## 2020-05-26 DIAGNOSIS — E039 Hypothyroidism, unspecified: Secondary | ICD-10-CM | POA: Diagnosis not present

## 2020-05-26 DIAGNOSIS — M797 Fibromyalgia: Secondary | ICD-10-CM | POA: Diagnosis not present

## 2020-05-26 DIAGNOSIS — Z79890 Hormone replacement therapy (postmenopausal): Secondary | ICD-10-CM | POA: Diagnosis not present

## 2020-05-26 LAB — T3: T3, Total: 110 ng/dL (ref 76–181)

## 2020-05-27 ENCOUNTER — Encounter: Payer: Self-pay | Admitting: Family Medicine

## 2020-05-27 ENCOUNTER — Ambulatory Visit: Payer: Medicare Other

## 2020-06-01 ENCOUNTER — Other Ambulatory Visit: Payer: Self-pay

## 2020-06-01 ENCOUNTER — Encounter: Payer: Self-pay | Admitting: Family Medicine

## 2020-06-01 ENCOUNTER — Ambulatory Visit (INDEPENDENT_AMBULATORY_CARE_PROVIDER_SITE_OTHER): Payer: Medicare Other | Admitting: Family Medicine

## 2020-06-01 VITALS — BP 114/78 | HR 89 | Temp 97.6°F | Ht 67.0 in | Wt 167.4 lb

## 2020-06-01 DIAGNOSIS — F411 Generalized anxiety disorder: Secondary | ICD-10-CM

## 2020-06-01 DIAGNOSIS — K5903 Drug induced constipation: Secondary | ICD-10-CM

## 2020-06-01 DIAGNOSIS — F331 Major depressive disorder, recurrent, moderate: Secondary | ICD-10-CM

## 2020-06-01 DIAGNOSIS — R591 Generalized enlarged lymph nodes: Secondary | ICD-10-CM

## 2020-06-01 DIAGNOSIS — G894 Chronic pain syndrome: Secondary | ICD-10-CM

## 2020-06-01 DIAGNOSIS — Z2839 Other underimmunization status: Secondary | ICD-10-CM

## 2020-06-01 DIAGNOSIS — K219 Gastro-esophageal reflux disease without esophagitis: Secondary | ICD-10-CM

## 2020-06-01 DIAGNOSIS — E559 Vitamin D deficiency, unspecified: Secondary | ICD-10-CM

## 2020-06-01 DIAGNOSIS — E038 Other specified hypothyroidism: Secondary | ICD-10-CM | POA: Diagnosis not present

## 2020-06-01 DIAGNOSIS — E039 Hypothyroidism, unspecified: Secondary | ICD-10-CM

## 2020-06-01 DIAGNOSIS — K582 Mixed irritable bowel syndrome: Secondary | ICD-10-CM

## 2020-06-01 DIAGNOSIS — Z0000 Encounter for general adult medical examination without abnormal findings: Secondary | ICD-10-CM | POA: Diagnosis not present

## 2020-06-01 DIAGNOSIS — G500 Trigeminal neuralgia: Secondary | ICD-10-CM

## 2020-06-01 DIAGNOSIS — E7800 Pure hypercholesterolemia, unspecified: Secondary | ICD-10-CM

## 2020-06-01 DIAGNOSIS — E611 Iron deficiency: Secondary | ICD-10-CM

## 2020-06-01 DIAGNOSIS — K3184 Gastroparesis: Secondary | ICD-10-CM

## 2020-06-01 DIAGNOSIS — Z833 Family history of diabetes mellitus: Secondary | ICD-10-CM | POA: Diagnosis not present

## 2020-06-01 DIAGNOSIS — E063 Autoimmune thyroiditis: Secondary | ICD-10-CM | POA: Diagnosis not present

## 2020-06-01 DIAGNOSIS — Z780 Asymptomatic menopausal state: Secondary | ICD-10-CM

## 2020-06-01 MED ORDER — LORAZEPAM 1 MG PO TABS: 0.5000 mg | ORAL_TABLET | Freq: Two times a day (BID) | ORAL | 0 refills | Status: DC | PRN

## 2020-06-01 NOTE — Progress Notes (Signed)
Patient ID: Karen Aguilar, female    DOB: 30-Jun-1965, 55 y.o.   MRN: [id number]  This visit was conducted in person.  BP 114/78   Pulse 89   Temp 97.6 F (36.4 C) (Temporal)   Ht _0  (1.702 m)   Wt 167 lb 7 oz (75.9 kg)   LMP 10/04/2014 Comment: per labs  SpO2 97%   BMI 26.22 kg/m    CC: AMW/CPE Subjective:   HPI: Karen Aguilar is a 55 y.o. female presenting on 06/01/2020 for Medicare Wellness   Did not see health advisor this year.    Hearing Screening   _1  _2  _3  _4  _5  _6  _7  _8  _9   Right ear:   25 40 20  20    Left ear:   25 0 25  25    Vision Screening Comments: Last eye exam, 05/2020.  [facility] Office Visit from 06/01/2020 in [place] at [facility]  PHQ-2 Total Score 4      Fall Risk  06/01/2020 01/18/2017 10/12/2016 10/02/2016 10/05/2015  Falls in the past year? 0 Yes Yes Yes Yes  Number falls in past yr: - 1 2 or more 2 or more 1  Injury with Fall? - - No No No  Risk Factor Category  - - High Fall Risk - -  Risk for fall due to : - - Impaired balance/gait;Impaired mobility;History of fall(s) - -  Follow up - - - Falls evaluation completed Falls evaluation completed    Seeing ENT for trigeminal neuralgia with cervical LAD - s/p reassuring excisional LN biopsy.  She states she's had multiple MMR vaccines and they have never granted immunity.   Preventative: COLONOSCOPY 10/2016 rpt 10 yrs Gustavo Lah)  COLONOSCOPY 03/2019 - 1 polyp, biopsies taken ([place]) - sees Dr Earnie Larsson [facility] Well woman: Dr. Ritta Slot OBGYN.Normal pap 08/2018, Q5 yrs. Post-menopausal confirmed by hormone testing.  Mammogram8/2020 [facility] @ [place]. Fibrous breasts - strong fmhx breast cancer (maternal aunt, maternal great aunt).  Flu shot - declines - has reacted previously COVID vaccine - discussed, declined Tetanus - declines due to coverage  Shingrix - declines Advanced planning -Received advanced directives 09/2014, scanned. HCPOA is Jimmy Picket then mother Harrie Jeans. Does not want life prolonging measures if terminally ill. Seat belt use discussed  Sunscreen use discussed. No changing moles on skin.  Non smoker  Alcohol - rarely  Dentist - q6 mo Ophthalmologist - regularly - recent corneal damage due to trigeminal neuralgia  LMPsometime 2016 - lab confirmed menopause  Bowel - alternating diarrhea, constipation Bladder -   Lives with husband and 2 daughters (one in college) Occupation: was Public affairs consultant, on disability since 2011 - arachnoiditis Edu: associate's degree Activity: severe limitations 2/2 arachnoiditis and chronic pain, walks to end of driveway and back  Diet: good water, fruits/vegetables daily      Relevant past medical, surgical, family and social history reviewed and updated as indicated. Interim medical history since our last visit reviewed. Allergies and medications reviewed and updated. Outpatient Medications Prior to Visit  Medication Sig Dispense Refill  . Ascorbic Acid (VITAMIN C PO) Take 1 tablet by mouth as needed (for immune system support).     . baclofen (LIORESAL) 10 MG tablet Take 10 mg by mouth 3 (three) times daily as needed. Takes 1.5 tablets 3 times a day.    . calcium carbonate (TUMS EX) 750 MG chewable tablet Chew 2-3 tablets by mouth 2 (two) times daily.    Marland Kitchen  camphor-menthol (SARNA) lotion Apply 1 application topically at bedtime as needed for itching.     . Cholecalciferol (VITAMIN D) 2000 units CAPS Take 1 capsule (2,000 Units total) by mouth daily. 30 capsule   . diclofenac sodium (VOLTAREN) 1 % GEL APPLY THIN LAYER EXTERNALLY TO THE AFFECTED AREA UP TO FOUR TIMES DAILY AS NEEDED 300 g 0  . docusate sodium (COLACE) 100 MG capsule Take 100 mg by mouth 2 (two) times daily. As needed    . hyoscyamine (LEVSIN SL) 0.125 MG SL tablet DISSOLVE 1 TABLET(0.125 MG) UNDER THE TONGUE EVERY 4 HOURS AS NEEDED 30 tablet 3  . NUCYNTA ER 200 MG TB12 Take 1 tablet by mouth 2  (two) times daily.    Marland Kitchen omeprazole (PRILOSEC) 40 MG capsule TAKE 1 CAPSULE BY MOUTH ONCE DAILY FOR 3WKS THEN AS NEEDED 30 capsule 3  . pediatric multivitamin-iron (POLY-VI-SOL WITH IRON) 15 MG chewable tablet Chew 1 tablet by mouth daily.    . promethazine (PHENERGAN) 25 MG tablet Take 1 tablet (25 mg total) by mouth every 8 (eight) hours as needed for nausea or vomiting. 30 tablet 0  . thyroid (ARMOUR) 60 MG tablet Take 60 mg by mouth daily before breakfast. Two days a week take extra tablet daily    . LORazepam (ATIVAN) 1 MG tablet TAKE 1/2 TO 1 TABLET BY MOUTH TWICE DAILY AS NEEDED ANXIETY 60 tablet 0  . Cenegermin-bkbj (OXERVATE) 0.002 % SOLN Apply to eye.    . clobetasol ointment (TEMOVATE) 8.18 % Apply 1 application topically as needed (for eczema).    . metroNIDAZOLE (METROGEL) 0.75 % gel Apply 1 application topically 2 (two) times daily. 45 g 3  . Phenylephrine-DM-GG-APAP 5-10-200-325 MG CAPS Take 2 tablets by mouth 2 (two) times daily as needed (for migraine).    . sucralfate (CARAFATE) 1 g tablet Take 1 tablet (1 g total) by mouth 2 (two) times daily before a meal. As needed 120 tablet 0  . triamcinolone (KENALOG) 0.1 % paste Use as directed 1 application in the mouth or throat 2 (two) times daily. 5 g 0   No facility-administered medications prior to visit.     Per HPI unless specifically indicated in ROS section below Review of Systems  Constitutional: Positive for appetite change. Negative for activity change, chills, fatigue, fever and unexpected weight change.  HENT: Negative for hearing loss.   Eyes: Negative for visual disturbance.  Respiratory: Negative for cough, chest tightness, shortness of breath and wheezing.   Cardiovascular: Positive for palpitations. Negative for chest pain and leg swelling (not bad).  Gastrointestinal: Positive for abdominal pain, diarrhea (loose stools with mucous) and nausea. Negative for abdominal distention, blood in stool, constipation and  vomiting.  Endocrine: Positive for cold intolerance.  Genitourinary: Negative for difficulty urinating and hematuria.  Musculoskeletal: Negative for arthralgias, myalgias and neck pain.  Skin: Negative for rash.  Neurological: Positive for dizziness and headaches (migraines). Negative for seizures and syncope.  Hematological: Positive for adenopathy. Does not bruise/bleed easily.  Psychiatric/Behavioral: Positive for dysphoric mood. The patient is nervous/anxious.    Objective:  BP 114/78   Pulse 89   Temp 97.6 F (36.4 C) (Temporal)   Ht _0  (1.702 m)   Wt 167 lb 7 oz (75.9 kg)   LMP 10/04/2014 Comment: per labs  SpO2 97%   BMI 26.22 kg/m   Wt Readings from Last 3 Encounters:  06/01/20 167 lb 7 oz (75.9 kg)  04/13/20 164 lb 3 oz (74.5 kg)  02/09/20 165 lb (74.8 kg)      Physical Exam Vitals and nursing note reviewed.  Constitutional:      General: She is not in acute distress.    Appearance: Normal appearance. She is well-developed. She is not ill-appearing.  HENT:     Head: Normocephalic and atraumatic.     Right Ear: Hearing, tympanic membrane, ear canal and external ear normal.     Left Ear: Hearing, tympanic membrane, ear canal and external ear normal.     Mouth/Throat:     Pharynx: Uvula midline.  Eyes:     General: No scleral icterus.    Extraocular Movements: Extraocular movements intact.     Conjunctiva/sclera: Conjunctivae normal.     Pupils: Pupils are equal, round, and reactive to light.  Cardiovascular:     Rate and Rhythm: Normal rate and regular rhythm.     Pulses: Normal pulses.          Radial pulses are 2+ on the right side and 2+ on the left side.     Heart sounds: Normal heart sounds. No murmur heard.   Pulmonary:     Effort: Pulmonary effort is normal. No respiratory distress.     Breath sounds: Normal breath sounds. No wheezing, rhonchi or rales.  Abdominal:     General: Bowel sounds are normal. There is no distension.     Palpations:  Abdomen is soft. There is no mass.     Tenderness: There is no abdominal tenderness. There is no guarding or rebound.     Hernia: No hernia is present.  Musculoskeletal:        General: Normal range of motion.     Cervical back: Normal range of motion and neck supple.  Lymphadenopathy:     Cervical: No cervical adenopathy.  Skin:    General: Skin is warm and dry.     Findings: No rash.  Neurological:     General: No focal deficit present.     Mental Status: She is alert and oriented to person, place, and time.     Comments:  CN grossly intact, station and gait intact Recall 3/3 Calculation 5/5 DLROW  Psychiatric:        Mood and Affect: Mood normal.        Behavior: Behavior normal.        Thought Content: Thought content normal.        Judgment: Judgment normal.       Results for orders placed or performed in visit on 05/25/20  Vitamin B12  Result Value Ref Range   Vitamin B-12 656 211 - 911 pg/mL  T4, free  Result Value Ref Range   Free T4 0.47 (L) 0.60 - 1.60 ng/dL  T3  Result Value Ref Range   T3, Total 110 76 - 181 ng/dL  CBC with Differential/Platelet  Result Value Ref Range   WBC 5.2 4.0 - 10.5 K/uL   RBC 4.61 3.87 - 5.11 Mil/uL   Hemoglobin 13.7 12.0 - 15.0 g/dL   HCT 39.5 36.0 - 46.0 %   MCV 85.7 78.0 - 100.0 fl   MCHC 34.8 30.0 - 36.0 g/dL   RDW 13.0 11.5 - 15.5 %   Platelets 276.0 150.0 - 400.0 K/uL   Neutrophils Relative % 60.6 43.0 - 77.0 %   Lymphocytes Relative 33.5 12.0 - 46.0 %   Monocytes Relative 5.9 3.0 - 12.0 %   Eosinophils Relative 0.0 0.0 - 5.0 %   Basophils Relative 0.0  0.0 - 3.0 %   Neutro Abs 3.2 1.4 - 7.7 K/uL   Lymphs Abs 1.8 0.7 - 4.0 K/uL   Monocytes Absolute 0.3 0.1 - 1.0 K/uL   Eosinophils Absolute 0.0 0.0 - 0.7 K/uL   Basophils Absolute 0.0 0.0 - 0.1 K/uL  Ferritin  Result Value Ref Range   Ferritin 29.2 10.0 - 291.0 ng/mL  IBC panel  Result Value Ref Range   Iron 34 (L) 42 - 145 ug/dL   Transferrin 295.0 212.0 - 360.0  mg/dL   Saturation Ratios 8.2 (L) 20.0 - 50.0 %  VITAMIN D 25 Hydroxy (Vit-D Deficiency, Fractures)  Result Value Ref Range   VITD 26.90 (L) 30.00 - 100.00 ng/mL  TSH  Result Value Ref Range   TSH 15.55 (H) 0.35 - 4.50 uIU/mL  Comprehensive metabolic panel  Result Value Ref Range   Sodium 141 135 - 145 mEq/L   Potassium 4.2 3.5 - 5.1 mEq/L   Chloride 101 96 - 112 mEq/L   CO2 34 (H) 19 - 32 mEq/L   Glucose, Bld 105 (H) 70 - 99 mg/dL   BUN 21 6 - 23 mg/dL   Creatinine, Ser 0.86 0.40 - 1.20 mg/dL   Total Bilirubin 0.3 0.2 - 1.2 mg/dL   Alkaline Phosphatase 79 39 - 117 U/L   AST 11 0 - 37 U/L   ALT 11 0 - 35 U/L   Total Protein 6.5 6.0 - 8.3 g/dL   Albumin 4.2 3.5 - 5.2 g/dL   GFR 76.78 >60.00 mL/min   Calcium 9.6 8.4 - 10.5 mg/dL  Lipid panel  Result Value Ref Range   Cholesterol 248 (H) 0 - 200 mg/dL   Triglycerides 105.0 0.0 - 149.0 mg/dL   HDL 58.00 >39.00 mg/dL   VLDL 21.0 0.0 - 40.0 mg/dL   LDL Cholesterol 169 (H) 0 - 99 mg/dL   Total CHOL/HDL Ratio 4    NonHDL 189.89    Depression screen [facility] 2/9 06/01/2020 10/12/2016 10/05/2015 07/15/2014  Decreased Interest _0 Down, Depressed, Hopeless _1 PHQ - 2 Score _2 Altered sleeping _3 Tired, decreased energy _4 Change in appetite _5 Feeling bad or failure about yourself  _6 Trouble concentrating _7 Moving slowly or fidgety/restless 0 1 - 0  Suicidal thoughts 0 _8 PHQ-9 Score _9 Difficult doing work/chores - Very difficult Somewhat difficult -  Some recent data might be hidden    GAD 7 : Generalized Anxiety Score 06/01/2020  Nervous, Anxious, on Edge 2  Control/stop worrying 2  Worry too much - different things 1  Trouble relaxing 2  Restless 2  Easily annoyed or irritable 1  Afraid - awful might happen 0  Total GAD 7 Score 10   Assessment & Plan:  This visit occurred during the SARS-CoV-2 public health emergency.  Safety protocols were in place, including  screening questions prior to the visit, additional usage of staff PPE, and extensive cleaning of exam room while observing appropriate contact time as indicated for disinfecting solutions.   Problem List Items Addressed This Visit    Hypothyroidism    Managed through endo on armour thyroid.  Recent rpt labs after our TFTs abnromal.       GERD (gastroesophageal reflux disease)    Continues PPI PRN  Chronic pain syndrome    On nucynta through pain clinic.      MDD (major depressive disorder), recurrent episode, moderate (HCC)    Continue PRN lorazepam. Not on antidepressant.       Relevant Medications   LORazepam (ATIVAN) 1 MG tablet   Hyperlipidemia    Chronic, not on statin. Low ASCVD risk. Food intolerances/GI issues limit low chol diet (fiber, vegetables).  The 10-year ASCVD risk score Mikey Bussing DC Brooke Bonito., et al., 2013) is: 1.7%   Values used to calculate the score:     Age: 85 years     Sex: Female     Is Non-Hispanic African American: No     Diabetic: No     Tobacco smoker: No     Systolic Blood Pressure: 734 mmHg     Is BP treated: No     HDL Cholesterol: 58 mg/dL     Total Cholesterol: 248 mg/dL       Vitamin D deficiency    rec restart vit D 2000 IU daily.       Iron deficiency    Iron deficiency without anemia. Low normal ferritin. Unable to tolerate oral iron. Provided with iron rich diet handout to work on.       Constipation    Chronic. Opioid-related. Unable to tolerate oral iron for this reason.       Gastroparesis   Irritable bowel syndrome   Health maintenance examination    Preventative protocols reviewed and updated unless pt declined. Discussed healthy diet and lifestyle.  Declines immunizations due to bad reactions in the past.       Post-menopausal   GAD (generalized anxiety disorder)    Continue lorazepam - changed from klonopin as there was concern klonopin was causing dry eyes       Relevant Medications   LORazepam (ATIVAN) 1 MG  tablet   Lymphadenopathy    Recent reassuring biopsy by [facility].       Medicare annual wellness visit, subsequent - Primary    I have personally reviewed the Medicare Annual Wellness questionnaire and have noted 1. The patient's medical and social history 2. Their use of alcohol, tobacco or illicit drugs 3. Their current medications and supplements 4. The patient's functional ability including ADL's, fall risks, home safety risks and hearing or visual impairment. Cognitive function has been assessed and addressed as indicated.  5. Diet and physical activity 6. Evidence for depression or mood disorders The patients weight, height, BMI have been recorded in the chart. I have made referrals, counseling and provided education to the patient based on review of the above and I have provided the pt with a written personalized care plan for preventive services. Provider list updated.. See scanned questionairre as needed for further documentation. Reviewed preventative protocols and updated unless pt declined.       Trigeminal neuralgia    To left face, [facility].       Relevant Medications   LORazepam (ATIVAN) 1 MG tablet   Immunization deficiency    States has never responded to MMR. Unclear cause.          Meds ordered this encounter  Medications  . LORazepam (ATIVAN) 1 MG tablet    Sig: Take 0.5-1 tablets (0.5-1 mg total) by mouth 2 (two) times daily as needed for anxiety.    Dispense:  60 tablet    Refill:  0   No orders of the defined types were placed in this encounter.  Patient instructions: Call to schedule mammogram at your convenience: [facility] at [facility] [phone] Iron rich diet handout provided today Restart vitamin D as levels were a bit low.  Good to see you today Return in 4 months for repeat labs (iron) and office visit.   Follow up plan: Return in about 6 months (around 12/01/2020) for follow up visit.  Ria Bush, MD

## 2020-06-01 NOTE — Patient Instructions (Addendum)
Call to schedule mammogram at your convenience: [facility] at [facility] [phone] Iron rich diet handout provided today Restart vitamin D as levels were a bit low.  Good to see you today Return in 4 months for repeat labs (iron) and office visit.   Health Maintenance for Postmenopausal Women Menopause is a normal process in which your ability to get pregnant comes to an end. This process happens slowly over many months or years, usually between the ages of 13 and 46. Menopause is complete when you have missed your menstrual periods for 12 months. It is important to talk with your health care provider about some of the most common conditions that affect women after menopause (postmenopausal women). These include heart disease, cancer, and bone loss (osteoporosis). Adopting a healthy lifestyle and getting preventive care can help to promote your health and wellness. The actions you take can also lower your chances of developing some of these common conditions. What should I know about menopause? During menopause, you may get a number of symptoms, such as:  Hot flashes. These can be moderate or severe.  Night sweats.  Decrease in sex drive.  Mood swings.  Headaches.  Tiredness.  Irritability.  Memory problems.  Insomnia. Choosing to treat or not to treat these symptoms is a decision that you make with your health care provider. Do I need hormone replacement therapy?  Hormone replacement therapy is effective in treating symptoms that are caused by menopause, such as hot flashes and night sweats.  Hormone replacement carries certain risks, especially as you become older. If you are thinking about using estrogen or estrogen with progestin, discuss the benefits and risks with your health care provider. What is my risk for heart disease and stroke? The risk of heart disease, heart attack, and stroke increases as you age. One of the causes may be a change in the body's  hormones during menopause. This can affect how your body uses dietary fats, triglycerides, and cholesterol. Heart attack and stroke are medical emergencies. There are many things that you can do to help prevent heart disease and stroke. Watch your blood pressure  High blood pressure causes heart disease and increases the risk of stroke. This is more likely to develop in people who have high blood pressure readings, are of African descent, or are overweight.  Have your blood pressure checked: ? Every 3-5 years if you are 20-73 years of age. ? Every year if you are 21 years old or older. Eat a healthy diet  Eat a diet that includes plenty of vegetables, fruits, low-fat dairy products, and lean protein.  Do not eat a lot of foods that are high in solid fats, added sugars, or sodium.   Get regular exercise Get regular exercise. This is one of the most important things you can do for your health. Most adults should:  Try to exercise for at least 150 minutes each week. The exercise should increase your heart rate and make you sweat (moderate-intensity exercise).  Try to do strengthening exercises at least twice each week. Do these in addition to the moderate-intensity exercise.  Spend less time sitting. Even light physical activity can be beneficial. Other tips  Work with your health care provider to achieve or maintain a healthy weight.  Do not use any products that contain nicotine or tobacco, such as cigarettes, e-cigarettes, and chewing tobacco. If you need help quitting, ask your health care provider.  Know your numbers. Ask your health care provider to  check your cholesterol and your blood sugar (glucose). Continue to have your blood tested as directed by your health care provider. Do I need screening for cancer? Depending on your health history and family history, you may need to have cancer screening at different stages of your life. This may include screening for:  Breast  cancer.  Cervical cancer.  Lung cancer.  Colorectal cancer. What is my risk for osteoporosis? After menopause, you may be at increased risk for osteoporosis. Osteoporosis is a condition in which bone destruction happens more quickly than new bone creation. To help prevent osteoporosis or the bone fractures that can happen because of osteoporosis, you may take the following actions:  If you are 93-37 years old, get at least 1,000 mg of calcium and at least 600 mg of vitamin D per day.  If you are older than age 37 but younger than age 16, get at least 1,200 mg of calcium and at least 600 mg of vitamin D per day.  If you are older than age 16, get at least 1,200 mg of calcium and at least 800 mg of vitamin D per day. Smoking and drinking excessive alcohol increase the risk of osteoporosis. Eat foods that are rich in calcium and vitamin D, and do weight-bearing exercises several times each week as directed by your health care provider. How does menopause affect my mental health? Depression may occur at any age, but it is more common as you become older. Common symptoms of depression include:  Low or sad mood.  Changes in sleep patterns.  Changes in appetite or eating patterns.  Feeling an overall lack of motivation or enjoyment of activities that you previously enjoyed.  Frequent crying spells. Talk with your health care provider if you think that you are experiencing depression. General instructions See your health care provider for regular wellness exams and vaccines. This may include:  Scheduling regular health, dental, and eye exams.  Getting and maintaining your vaccines. These include: ? Influenza vaccine. Get this vaccine each year before the flu season begins. ? Pneumonia vaccine. ? Shingles vaccine. ? Tetanus, diphtheria, and pertussis (Tdap) booster vaccine. Your health care provider may also recommend other immunizations. Tell your health care provider if you have ever  been abused or do not feel safe at home. Summary  Menopause is a normal process in which your ability to get pregnant comes to an end.  This condition causes hot flashes, night sweats, decreased interest in sex, mood swings, headaches, or lack of sleep.  Treatment for this condition may include hormone replacement therapy.  Take actions to keep yourself healthy, including exercising regularly, eating a healthy diet, watching your weight, and checking your blood pressure and blood sugar levels.  Get screened for cancer and depression. Make sure that you are up to date with all your vaccines. This information is not intended to replace advice given to you by your health care provider. Make sure you discuss any questions you have with your health care provider. Document Revised: 12/11/2017 Document Reviewed: 12/11/2017 Elsevier Patient Education  2021 [employer].

## 2020-06-02 DIAGNOSIS — Z0000 Encounter for general adult medical examination without abnormal findings: Secondary | ICD-10-CM | POA: Insufficient documentation

## 2020-06-02 DIAGNOSIS — Z2839 Other underimmunization status: Secondary | ICD-10-CM | POA: Insufficient documentation

## 2020-06-02 DIAGNOSIS — G500 Trigeminal neuralgia: Secondary | ICD-10-CM | POA: Insufficient documentation

## 2020-06-02 NOTE — Assessment & Plan Note (Signed)
On nucynta through pain clinic.

## 2020-06-02 NOTE — Assessment & Plan Note (Signed)
Continue PRN lorazepam. Not on antidepressant.

## 2020-06-02 NOTE — Assessment & Plan Note (Signed)
rec restart vit D 2000 IU daily.

## 2020-06-02 NOTE — Assessment & Plan Note (Signed)
Iron deficiency without anemia. Low normal ferritin. Unable to tolerate oral iron. Provided with iron rich diet handout to work on.

## 2020-06-02 NOTE — Assessment & Plan Note (Signed)
Managed through endo on armour thyroid.  Recent rpt labs after our TFTs abnromal.

## 2020-06-02 NOTE — Assessment & Plan Note (Signed)
To left face, followed by [facility].

## 2020-06-02 NOTE — Assessment & Plan Note (Signed)
Continues PPI PRN 

## 2020-06-02 NOTE — Assessment & Plan Note (Addendum)
Recent reassuring biopsy by [facility].

## 2020-06-02 NOTE — Assessment & Plan Note (Signed)
States has never responded to MMR. Unclear cause.

## 2020-06-02 NOTE — Assessment & Plan Note (Addendum)
Preventative protocols reviewed and updated unless pt declined. Discussed healthy diet and lifestyle.  Declines immunizations due to bad reactions in the past.

## 2020-06-02 NOTE — Assessment & Plan Note (Addendum)
Chronic, not on statin. Low ASCVD risk. Food intolerances/GI issues limit low chol diet (fiber, vegetables).  The 10-year ASCVD risk score Mikey Bussing DC Brooke Bonito., et al., 2013) is: 1.7%   Values used to calculate the score:     Age: 55 years     Sex: Female     Is Non-Hispanic African American: No     Diabetic: No     Tobacco smoker: No     Systolic Blood Pressure: 335 mmHg     Is BP treated: No     HDL Cholesterol: 58 mg/dL     Total Cholesterol: 248 mg/dL

## 2020-06-02 NOTE — Assessment & Plan Note (Addendum)
Continue lorazepam - changed from klonopin as there was concern klonopin was causing dry eyes

## 2020-06-02 NOTE — Assessment & Plan Note (Signed)

## 2020-06-02 NOTE — Assessment & Plan Note (Addendum)
Chronic. Opioid-related. Unable to tolerate oral iron for this reason.

## 2020-06-08 ENCOUNTER — Telehealth: Payer: Self-pay

## 2020-06-08 NOTE — Telephone Encounter (Signed)
Received faxed PA form from [place] for lorazepam 1 mg tab.  Placed form in Dr. Synthia Innocent box.

## 2020-06-08 NOTE — Telephone Encounter (Signed)
Faxed form.

## 2020-06-08 NOTE — Telephone Encounter (Signed)
Filled and in Lisa's box 

## 2020-06-09 NOTE — Telephone Encounter (Signed)
Received call from [facility] re pre auth for Lorazepam Authorized:[id number]  Dates: 06/08/20-06/08/21

## 2020-06-09 NOTE — Telephone Encounter (Signed)
Noted.  Made pharmacy aware.

## 2020-06-16 DIAGNOSIS — M47812 Spondylosis without myelopathy or radiculopathy, cervical region: Secondary | ICD-10-CM | POA: Diagnosis not present

## 2020-06-16 DIAGNOSIS — R51 Headache with orthostatic component, not elsewhere classified: Secondary | ICD-10-CM | POA: Diagnosis not present

## 2020-06-16 DIAGNOSIS — M62831 Muscle spasm of calf: Secondary | ICD-10-CM | POA: Diagnosis not present

## 2020-06-16 DIAGNOSIS — G894 Chronic pain syndrome: Secondary | ICD-10-CM | POA: Diagnosis not present

## 2020-06-23 DIAGNOSIS — H18892 Other specified disorders of cornea, left eye: Secondary | ICD-10-CM | POA: Diagnosis not present

## 2020-06-29 ENCOUNTER — Ambulatory Visit: Payer: Medicare Other

## 2020-06-30 ENCOUNTER — Ambulatory Visit: Payer: Medicare Other

## 2020-07-01 DIAGNOSIS — H524 Presbyopia: Secondary | ICD-10-CM | POA: Diagnosis not present

## 2020-07-20 ENCOUNTER — Emergency Department
Admission: EM | Admit: 2020-07-20 | Discharge: 2020-07-20 | Disposition: A | Discharge status: Home / Self Care | Payer: Medicare Other | Attending: Emergency Medicine | Admitting: Emergency Medicine

## 2020-07-20 ENCOUNTER — Emergency Department: Payer: Medicare Other

## 2020-07-20 ENCOUNTER — Encounter: Payer: Self-pay | Admitting: Emergency Medicine

## 2020-07-20 ENCOUNTER — Other Ambulatory Visit: Payer: Self-pay

## 2020-07-20 ENCOUNTER — Telehealth: Payer: Self-pay

## 2020-07-20 DIAGNOSIS — U071 COVID-19: Secondary | ICD-10-CM | POA: Diagnosis not present

## 2020-07-20 DIAGNOSIS — E039 Hypothyroidism, unspecified: Secondary | ICD-10-CM | POA: Insufficient documentation

## 2020-07-20 DIAGNOSIS — J069 Acute upper respiratory infection, unspecified: Secondary | ICD-10-CM

## 2020-07-20 DIAGNOSIS — Z28310 Unvaccinated for covid-19: Secondary | ICD-10-CM | POA: Diagnosis not present

## 2020-07-20 DIAGNOSIS — Z79899 Other long term (current) drug therapy: Secondary | ICD-10-CM | POA: Diagnosis not present

## 2020-07-20 DIAGNOSIS — R509 Fever, unspecified: Secondary | ICD-10-CM | POA: Diagnosis not present

## 2020-07-20 DIAGNOSIS — Z8616 Personal history of COVID-19: Secondary | ICD-10-CM | POA: Diagnosis not present

## 2020-07-20 DIAGNOSIS — R059 Cough, unspecified: Secondary | ICD-10-CM | POA: Diagnosis not present

## 2020-07-20 LAB — URINALYSIS, COMPLETE (UACMP) WITH MICROSCOPIC
Bacteria, UA: NONE SEEN
Bilirubin Urine: NEGATIVE
Glucose, UA: NEGATIVE mg/dL
Hgb urine dipstick: NEGATIVE
Ketones, ur: NEGATIVE mg/dL
Leukocytes,Ua: NEGATIVE
Nitrite: NEGATIVE
Protein, ur: NEGATIVE mg/dL
Specific Gravity, Urine: 1.021 (ref 1.005–1.030)
pH: 5 (ref 5.0–8.0)

## 2020-07-20 LAB — RESP PANEL BY RT-PCR (FLU A&B, COVID) ARPGX2
Influenza A by PCR: NEGATIVE
Influenza B by PCR: NEGATIVE
SARS Coronavirus 2 by RT PCR: NEGATIVE

## 2020-07-20 LAB — GROUP A STREP BY PCR: Group A Strep by PCR: NOT DETECTED

## 2020-07-20 MED ORDER — HYDROCOD POLST-CPM POLST ER 10-8 MG/5ML PO SUER: 5.0000 mL | Freq: Two times a day (BID) | ORAL | 0 refills | Status: DC | PRN

## 2020-07-20 NOTE — ED Provider Notes (Signed)
[facility] Emergency Department Provider Note   ____________________________________________   Event Date/Time   First MD Initiated Contact with Patient 07/20/20 1326     (approximate)  I have reviewed the triage vital signs and the nursing notes.   HISTORY  Chief Complaint URI   HPI Karen Aguilar is a 55 y.o. female presents to the ED with complaint of cough, shortness of breath, chills and fever for the last 3 weeks.  Patient states that her symptoms were improving but 3 days ago returned with a low-grade temp between 99 and 100 at home.  Patient states that she is taken home COVID test which have been negative.  She is not vaccinated and is unaware of any known COVID exposure.  Patient denies any shortness of breath but is exhausted from coughing which is also given her sore throat.       Past Medical History:  Diagnosis Date   Abnormal MRI scan, bone 2012   abnormal marrow signal humeral head, glenoid and scapula - diffuse replacement of fatty marrow - likely benign, no need for further investigation ([place])   Agnosia for temperature    Right Leg   Anemia    etiology unknown-iron infusion in past,last 9/12   Anxiety    Anxiety and depression    Arachnoiditis    S1 nerve root, 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 Eartha Inch Pain   COVID-19 virus infection 09/2018   DDD (degenerative disc disease), cervical    DDD (degenerative disc disease), lumbar    s/p permanent nerve damage after back surgery   Depression    Depression with anxiety    Diverticulosis    Eczema    Family history of adverse reaction to anesthesia    Mother - nausea   Fibromyalgia    Foot drop    right - numbness, tingling - twisting   Gallstones    GERD (gastroesophageal reflux disease)    rare zantac PRN N/V at times with this   Headache(784.0)    migraines   Hemorrhoid    Hemorrhoids    History of kidney stones     Hyperlipidemia    Hypothyroidism 1990s   hashimoto's thyroiditis   IBS (irritable bowel syndrome)    IBS (irritable bowel syndrome)    Incontinence of urine    s/p sling procedure-unresolved   Iron deficiency    h/o anemia, s/p iron infusion (2012)   Liver hemangioma     x2   Memory change    due to medications   Migraines    and h/o optical migraine   Migraines    Mixed urge and stress incontinence 2008   extensive workup including urodynamics, failed miltiple anticholinergics including myrbetriq and urogesic blue s/p PTNS, normal cystoscopy 2014, seen by Dr Matilde Sprang and Dr Erlene Quan   Neurogenic bladder 2008   after 13 spine surgeries with periph neuropathy and chronic LBP   Neuropathy of lower extremity 2011   Nocturia    Notalgia paresthetica    Osteoarthritis 12/2013   PONV (postoperative nausea and vomiting)    Pulmonary nodule 04/2012   7.25mm RLL nodule - resolved on imaging 12/2012   Sleep apnea    mild-doesn't use cpap   Urine frequency    Vitamin D deficiency     Patient Active Problem List   Diagnosis Date Noted   Medicare annual wellness visit, subsequent 06/02/2020   Trigeminal neuralgia  06/02/2020   Immunization deficiency 06/02/2020   Mouth burn, initial encounter 04/16/2020   Recurrent oral ulcers 02/22/2020   Dry eyes 02/10/2020   Lymphadenopathy 02/10/2020   Left facial numbness 08/27/2019   Hepatic hemangioma 09/01/2018   Lower abdominal pain 04/14/2018   Mass of chest wall, left 05/09/2017   Epigastric abdominal pain 02/28/2017   Non-intractable vomiting with nausea 02/27/2017   Right leg pain 12/06/2016   DDD (degenerative disc disease), cervical 06/09/2016   GAD (generalized anxiety disorder) 02/18/2016   Right shoulder pain 02/18/2016   Pelvic pressure in female 01/23/2016   Post-menopausal 10/10/2015   Urticaria 09/08/2015   Neurogenic bladder    Mixed urge and stress incontinence    Chronic headache 09/22/2014   Advanced care  planning/counseling discussion 07/15/2014   Health maintenance examination 07/15/2014   Irritable bowel syndrome 02/23/2014   Dysphagia 06/05/2013   Chronic diarrhea 05/12/2013   Chronic pelvic pain in female 05/05/2013   Gastroparesis 05/04/2013   Solitary pulmonary nodule 03/18/2013   RUQ abdominal pain 02/10/2013   Constipation 02/10/2013   Internal hemorrhoids 02/10/2013   Hypothyroidism    Fibromyalgia    GERD (gastroesophageal reflux disease)    Chronic pain syndrome    Sleep apnea    MDD (major depressive disorder), recurrent episode, moderate (HCC)    Arachnoiditis    Hyperlipidemia    Complicated migraine    Vitamin D deficiency    Iron deficiency    DDD (degenerative disc disease), lumbar     Past Surgical History:  Procedure Laterality Date   ANTERIOR CERVICAL DECOMP/DISCECTOMY FUSION     C3, 4, 5 - improved after surgery   ANTERIOR CERVICAL DECOMP/DISCECTOMY FUSION  07/2017   C6/7 Dr Pamala Hurry   ARTERY BIOPSY Left 09/02/2019   Procedure: BIOPSY TEMPORAL ARTERY;  Surgeon: [name], MD;  Location: [facility] ORS;  Service: General;  Laterality: Left;   BACK SURGERY     multiple-L4-s1,hardware,fusion,removal,infection s/p 5/11 procedure   BACK SURGERY  02/16/11   "my 7th back OR; Procedure: Exploration of fusion removal of hardware L4-S1, harvesting of right iliac crest bone graft, redo posterior lateral fusion L4-5 using iliac crest bone graft BMP and master graft replacement of bilateral L4 screws with a Telfa tech 7.5 x 40 mm screw on the right and 8.5 x 40 mm screw on the left placement of a large Hemovac drain   [place]; 2001   x 2   CHOLECYSTECTOMY  2001   COLONOSCOPY  10/2016   rpt 10 yrs Gustavo Lah)   COLONOSCOPY  03/2019   1 polyp, biopsies taken ([place])   ESOPHAGEAL MANOMETRY  06/2017   WNL (Dr Earnie Larsson)   ESOPHAGOGASTRODUODENOSCOPY  03/2013   early esophageal stricture dilated, o/w WNL Deatra Ina)   ESOPHAGOGASTRODUODENOSCOPY  12/2014    WNL, s/p empiric dilation of esophagus Henrene Pastor)   ESOPHAGOGASTRODUODENOSCOPY  10/2015   WNL, biopsies WNL as well (minimal esophagitis with reactive changes) [place])   ESOPHAGOGASTRODUODENOSCOPY  10/2016   biopsy suggestive of ulcer ([place])   ESOPHAGOGASTRODUODENOSCOPY  03/2019   mild portal hypertensive gastropathy, gastric erosion without bleeding, duodenal diverticulum ([place])   HEMORRHOID BANDING     LASIK Bilateral    LUMBAR WOUND DEBRIDEMENT  03/15/2011   Procedure: LUMBAR WOUND DEBRIDEMENT;  Surgeon: Elaina Hoops, MD;  Lumbar Wound Debridement   MANDIBLE RECONSTRUCTION  1986   PFT  01/28/2013   WNL   PUBOVAGINAL SLING  2008   midurethral sling   SHOULDER CLOSED REDUCTION Left  12/18/2010   Procedure: CLOSED MANIPULATION SHOULDER;  Surgeon: Johnn Hai; labrial debridement   SPINAL CORD STIMULATOR INSERTION N/A 08/19/2015   Procedure: LUMBAR SPINAL CORD STIMULATOR INSERTION;  Surgeon: Erline Levine, MD;  Location: [place] [facility];  Service: Neurosurgery;  Laterality: N/A;  LUMBAR SPINAL CORD STIMULATOR INSERTION   [place]   as a child; "then they grew back"    Prior to Admission medications   Medication Sig Start Date End Date Taking? Authorizing Provider  chlorpheniramine-HYDROcodone (TUSSIONEX PENNKINETIC ER) 10-8 MG/5ML SUER Take 5 mLs by mouth every 12 (twelve) hours as needed for cough. 07/20/20  Yes Johnn Hai, PA-C  Ascorbic Acid (VITAMIN C PO) Take 1 tablet by mouth as needed (for immune system support).     [provider]  baclofen (LIORESAL) 10 MG tablet Take 10 mg by mouth 3 (three) times daily as needed. Takes 1.5 tablets 3 times a day. 04/06/20   [provider]  calcium carbonate (TUMS EX) 750 MG chewable tablet Chew 2-3 tablets by mouth 2 (two) times daily.    [provider]  camphor-menthol Timoteo Ace) lotion Apply 1 application topically at bedtime as needed for itching.     [provider]   Cholecalciferol (VITAMIN D) 2000 units CAPS Take 1 capsule (2,000 Units total) by mouth daily. 10/10/15   Ria Bush, MD  diclofenac sodium (VOLTAREN) 1 % GEL APPLY THIN LAYER EXTERNALLY TO THE AFFECTED AREA UP TO FOUR TIMES DAILY AS NEEDED 12/31/17   Mcarthur Rossetti, MD  docusate sodium (COLACE) 100 MG capsule Take 100 mg by mouth 2 (two) times daily. As needed    [provider]  hyoscyamine (LEVSIN SL) 0.125 MG SL tablet DISSOLVE 1 TABLET(0.125 MG) UNDER THE TONGUE EVERY 4 HOURS AS NEEDED 01/15/18   Ria Bush, MD  LORazepam (ATIVAN) 1 MG tablet Take 0.5-1 tablets (0.5-1 mg total) by mouth 2 (two) times daily as needed for anxiety. 06/01/20   Ria Bush, MD  NUCYNTA ER 200 MG TB12 Take 1 tablet by mouth 2 (two) times daily. 04/05/20   [provider]  omeprazole (PRILOSEC) 40 MG capsule TAKE 1 CAPSULE BY MOUTH ONCE DAILY FOR 3WKS THEN AS NEEDED 04/26/20   Ria Bush, MD  pediatric multivitamin-iron (POLY-VI-SOL WITH IRON) 15 MG chewable tablet Chew 1 tablet by mouth daily.    [provider]  promethazine (PHENERGAN) 25 MG tablet Take 1 tablet (25 mg total) by mouth every 8 (eight) hours as needed for nausea or vomiting. 04/24/19   Ria Bush, MD  thyroid (ARMOUR) 60 MG tablet Take 60 mg by mouth daily before breakfast. Two days a week take extra tablet daily 10/10/15   Ria Bush, MD    Allergies Augmentin [amoxicillin-pot clavulanate], Fluconazole, Erythromycin, Influenza vaccines, Levothyroxine, Thimerosal, Tomato, Adhesive [tape], Azithromycin, Gabapentin, Imitrex [sumatriptan], Lyrica [pregabalin], Macrodantin [nitrofurantoin], Macrolides and ketolides, Nickel, Sulfa antibiotics, Sulfasalazine, and Troleandomycin  Family History  Problem Relation Age of Onset   Lung cancer Father 61        (smoker)   Diabetes Brother    Thyroid disease Mother        ?empty sella   Irritable bowel syndrome Mother    Dysphagia Mother     CAD Maternal Grandfather        MI   Heart disease Maternal Grandfather    Heart attack Maternal Grandfather    Lung cancer Paternal Grandfather         (  smoker)   Colon cancer Paternal Grandmother    Thyroid disease Cousin    Cancer Other        unsure (ovarian/uterine)   Breast cancer Maternal Aunt    Alzheimer's disease Maternal Grandmother    Diabetes Daughter     Social History Social History   Tobacco Use   Smoking status: Never   Smokeless tobacco: Never  Vaping Use   Vaping Use: Never used  Substance Use Topics   Alcohol use: Yes    Alcohol/week: 0.0 standard drinks    Comment: rarely drinks wine   Drug use: No    Review of Systems Constitutional: Positive fever/positive chills Eyes: No visual changes. ENT: Positive sore throat. Cardiovascular: Denies chest pain. Respiratory: Denies shortness of breath.  Positive for cough. Gastrointestinal: No abdominal pain.  No nausea, no vomiting.  No diarrhea.  No constipation. Genitourinary: Negative for dysuria. Musculoskeletal: Positive muscle aches. Skin: Negative for rash. Neurological: Negative for headaches, focal weakness or numbness.  ____________________________________________   PHYSICAL EXAM:  VITAL SIGNS: ED Triage Vitals  Enc Vitals Group     BP 07/20/20 1232 115/82     Pulse Rate 07/20/20 1232 (!) 118     Resp 07/20/20 1232 18     Temp 07/20/20 1232 100 F (37.8 C)     Temp Source 07/20/20 1232 Oral     SpO2 07/20/20 1232 98 %     Weight 07/20/20 1202 167 lb 5.3 oz (75.9 kg)     Height 07/20/20 1202 5\' 7"  (1.702 m)     Head Circumference --      Peak Flow --      Pain Score 07/20/20 1201 6     Pain Loc --      Pain Edu? --      Excl. in [place]? --     Constitutional: Alert and oriented. Well appearing and in no acute distress. Eyes: Conjunctivae are normal.  Head: Atraumatic. Nose: No congestion/rhinnorhea. Mouth/Throat: Mucous membranes are moist.  Oropharynx non-erythematous.  No  exudate and uvula is midline. Neck: No stridor.   Cardiovascular: Normal rate, regular rhythm. Grossly normal heart sounds.  Good peripheral circulation. Respiratory: Normal respiratory effort.  No retractions. Lungs CTAB.  Frequent coarse cough is noted during exam. Gastrointestinal: Soft and nontender. No distention.  Musculoskeletal: No lower extremity tenderness nor edema.  No joint effusions. Neurologic:  Normal speech and language. No gross focal neurologic deficits are appreciated. No gait instability. Skin:  Skin is warm, dry and intact. No rash noted. Psychiatric: Mood and affect are normal. Speech and behavior are normal.  ____________________________________________   LABS (all labs ordered are listed, but only abnormal results are displayed)  Labs Reviewed  URINALYSIS, COMPLETE (UACMP) WITH MICROSCOPIC - Abnormal; Notable for the following components:      Result Value   Color, Urine YELLOW (*)    APPearance HAZY (*)    All other components within normal limits  RESP PANEL BY RT-PCR (FLU A&B, COVID) ARPGX2  GROUP A STREP BY PCR   ____________________________________________  EKG Reviewed by doctors in major ED. Sinus tachycardia with ventricular rate of 112  ____________________________________________  RADIOLOGY Leana Gamer, personally viewed and evaluated these images (plain radiographs) as part of my medical decision making, as well as reviewing the written report by the radiologist.   Official radiology report(s): DG Chest 2 View  Result Date: 07/20/2020 CLINICAL DATA:  Cough and fever EXAM: CHEST - 2 VIEW COMPARISON:  07/04/2019 FINDINGS:  Heart size is normal. Mediastinal shadows are normal. The lungs are clear. No infiltrate, collapse or effusion. Thoracic neurostimulator in place. Mild scoliotic curvature of the spine. Previous ACDF. IMPRESSION: No active cardiopulmonary disease. Electronically Signed   By: Nelson Chimes M.D.   On: 07/20/2020 14:06     ____________________________________________   PROCEDURES  Procedure(s) performed (including Critical Care):  Procedures   ____________________________________________   INITIAL IMPRESSION / ASSESSMENT AND PLAN / ED COURSE  As part of my medical decision making, I reviewed the following data within the electronic MEDICAL RECORD NUMBER Notes from prior ED visits and [place] Controlled Substance Database  55 year old female presents to the ED with complaint of cough and congestion which seem to be getting better until the last several days which her symptoms have returned and she has had a low-grade temp at home.  Chest x-ray was negative for pneumonia and reassuring.  COVID, influenza, strep test were negative.  Patient was encouraged to drink more fluids as her urine was hazy but was negative for infection.  Patient is to continue with clear liquids, Tylenol or ibuprofen as needed for body aches and fever and a prescription for Tussionex was sent to the pharmacy as needed for cough.  She is to follow-up with her PCP if any continued problems or return to the emergency department if any severe worsening of her symptoms.   ____________________________________________   FINAL CLINICAL IMPRESSION(S) / ED DIAGNOSES  Final diagnoses:  Viral URI with cough     ED Discharge Orders          Ordered    chlorpheniramine-HYDROcodone (TUSSIONEX PENNKINETIC ER) 10-8 MG/5ML SUER  Every 12 hours PRN        07/20/20 1543             Note:  This document was prepared using Dragon voice recognition software and may include unintentional dictation errors.    Johnn Hai, PA-C 07/20/20 1615    Lavonia Drafts, MD 07/20/20 1630

## 2020-07-20 NOTE — ED Notes (Signed)
See triage note  Presents with low grade temp,cough and some SOB  States sx's started about 3 weeks ago  Sx's did return 2 days ago

## 2020-07-20 NOTE — ED Triage Notes (Signed)
C/O cough, SOB, Chills, fevers x 3 weeks.  States symptoms had improved, but returned on Monday.  States fevers have been low grade 99-100.  Strong cough noted.  No SOB. DOE

## 2020-07-20 NOTE — Telephone Encounter (Signed)
Pt called stating starting 2 wks ago then got better and now worse again with symptoms of cough, fever 100 taking tylenol, pt thinks has ulcers in esophagus; now pt cannot eat with difficulty in swallowing, pt having difficulty breathing and pts throat feels like swelling. Advised pt she needs to go to ED now; pt declined 911 and said her husband is with her and only few mins from hospital. Pt does not want to go to ED; I advised pt she cannot go to UC with these symptoms and sometimes these symptoms can progress very quickly. Pt said OK and she would go to ED now. Sending note to Dr Darnell Level and Lattie Haw CMA. Taking note to Dr Darnell Level and Lisa's area now also.

## 2020-07-20 NOTE — Discharge Instructions (Addendum)
Follow-up with your primary care provider if any continued problems.  Increase fluids.  Tylenol or ibuprofen if needed for muscle aches, fever, sore throat or headache.  A prescription for Tussionex was sent to the pharmacy.  Do not drive or operate machinery while taking this medication as it could cause drowsiness and increase your risk for injury.  This is 1 teaspoon every 12 hours as needed for cough.

## 2020-07-21 NOTE — Telephone Encounter (Signed)
Seen at ER with reassuring evaluation dx viral uri with cough.  Plz call this week for update on symptoms.

## 2020-07-21 NOTE — Telephone Encounter (Signed)
Spoke with pt asking four update on sxs.  Pt states she still has some trouble breathing, still has cough- pulled muscle in back during coughing episode.  Says she had fever last night of 100 but today seems normal- did not actually take temp today.

## 2020-07-26 DIAGNOSIS — Z79891 Long term (current) use of opiate analgesic: Secondary | ICD-10-CM | POA: Diagnosis not present

## 2020-07-26 DIAGNOSIS — G894 Chronic pain syndrome: Secondary | ICD-10-CM | POA: Diagnosis not present

## 2020-07-26 DIAGNOSIS — M961 Postlaminectomy syndrome, not elsewhere classified: Secondary | ICD-10-CM | POA: Diagnosis not present

## 2020-07-26 DIAGNOSIS — R519 Headache, unspecified: Secondary | ICD-10-CM | POA: Diagnosis not present

## 2020-07-28 ENCOUNTER — Other Ambulatory Visit: Payer: Self-pay | Admitting: Family Medicine

## 2020-08-01 MED ORDER — LORAZEPAM 1 MG PO TABS: 0.5000 mg | ORAL_TABLET | Freq: Two times a day (BID) | ORAL | 0 refills | Status: DC | PRN

## 2020-08-01 NOTE — Telephone Encounter (Signed)
ERx 

## 2020-08-03 DIAGNOSIS — R1310 Dysphagia, unspecified: Secondary | ICD-10-CM | POA: Diagnosis not present

## 2020-08-03 DIAGNOSIS — K219 Gastro-esophageal reflux disease without esophagitis: Secondary | ICD-10-CM | POA: Diagnosis not present

## 2020-08-31 DIAGNOSIS — K3189 Other diseases of stomach and duodenum: Secondary | ICD-10-CM | POA: Diagnosis not present

## 2020-08-31 DIAGNOSIS — K219 Gastro-esophageal reflux disease without esophagitis: Secondary | ICD-10-CM | POA: Diagnosis not present

## 2020-08-31 DIAGNOSIS — Z880 Allergy status to penicillin: Secondary | ICD-10-CM | POA: Diagnosis not present

## 2020-08-31 DIAGNOSIS — Z883 Allergy status to other anti-infective agents status: Secondary | ICD-10-CM | POA: Diagnosis not present

## 2020-08-31 DIAGNOSIS — R519 Headache, unspecified: Secondary | ICD-10-CM | POA: Diagnosis not present

## 2020-08-31 DIAGNOSIS — K589 Irritable bowel syndrome without diarrhea: Secondary | ICD-10-CM | POA: Diagnosis not present

## 2020-08-31 DIAGNOSIS — D649 Anemia, unspecified: Secondary | ICD-10-CM | POA: Diagnosis not present

## 2020-08-31 DIAGNOSIS — Z881 Allergy status to other antibiotic agents status: Secondary | ICD-10-CM | POA: Diagnosis not present

## 2020-08-31 DIAGNOSIS — R1310 Dysphagia, unspecified: Secondary | ICD-10-CM | POA: Diagnosis not present

## 2020-08-31 DIAGNOSIS — E063 Autoimmune thyroiditis: Secondary | ICD-10-CM | POA: Diagnosis not present

## 2020-08-31 DIAGNOSIS — G8929 Other chronic pain: Secondary | ICD-10-CM | POA: Diagnosis not present

## 2020-08-31 DIAGNOSIS — J449 Chronic obstructive pulmonary disease, unspecified: Secondary | ICD-10-CM | POA: Diagnosis not present

## 2020-08-31 DIAGNOSIS — M069 Rheumatoid arthritis, unspecified: Secondary | ICD-10-CM | POA: Diagnosis not present

## 2020-08-31 DIAGNOSIS — E039 Hypothyroidism, unspecified: Secondary | ICD-10-CM | POA: Diagnosis not present

## 2020-08-31 DIAGNOSIS — G508 Other disorders of trigeminal nerve: Secondary | ICD-10-CM | POA: Diagnosis not present

## 2020-08-31 DIAGNOSIS — F419 Anxiety disorder, unspecified: Secondary | ICD-10-CM | POA: Diagnosis not present

## 2020-08-31 DIAGNOSIS — G4730 Sleep apnea, unspecified: Secondary | ICD-10-CM | POA: Diagnosis not present

## 2020-08-31 DIAGNOSIS — K319 Disease of stomach and duodenum, unspecified: Secondary | ICD-10-CM | POA: Diagnosis not present

## 2020-08-31 HISTORY — PX: ESOPHAGOGASTRODUODENOSCOPY: SHX1529

## 2020-09-01 ENCOUNTER — Other Ambulatory Visit: Payer: Self-pay | Admitting: Family Medicine

## 2020-09-01 DIAGNOSIS — Z1231 Encounter for screening mammogram for malignant neoplasm of breast: Secondary | ICD-10-CM

## 2020-09-01 HISTORY — PX: ESOPHAGOGASTRODUODENOSCOPY: SHX1529

## 2020-09-06 DIAGNOSIS — R51 Headache with orthostatic component, not elsewhere classified: Secondary | ICD-10-CM | POA: Diagnosis not present

## 2020-09-06 DIAGNOSIS — Z79891 Long term (current) use of opiate analgesic: Secondary | ICD-10-CM | POA: Diagnosis not present

## 2020-09-06 DIAGNOSIS — M961 Postlaminectomy syndrome, not elsewhere classified: Secondary | ICD-10-CM | POA: Diagnosis not present

## 2020-09-06 DIAGNOSIS — G894 Chronic pain syndrome: Secondary | ICD-10-CM | POA: Diagnosis not present

## 2020-09-12 ENCOUNTER — Encounter: Payer: Self-pay | Admitting: Family Medicine

## 2020-09-12 ENCOUNTER — Other Ambulatory Visit: Payer: Self-pay

## 2020-09-12 ENCOUNTER — Ambulatory Visit
Admission: RE | Admit: 2020-09-12 | Discharge: 2020-09-12 | Disposition: A | Discharge status: Home / Self Care | Payer: Medicare Other | Source: Ambulatory Visit | Attending: Family Medicine | Admitting: Family Medicine

## 2020-09-12 DIAGNOSIS — Z1231 Encounter for screening mammogram for malignant neoplasm of breast: Secondary | ICD-10-CM | POA: Diagnosis not present

## 2020-09-26 ENCOUNTER — Other Ambulatory Visit: Payer: Self-pay | Admitting: Family Medicine

## 2020-09-26 MED ORDER — LORAZEPAM 1 MG PO TABS: 0.5000 mg | ORAL_TABLET | Freq: Two times a day (BID) | ORAL | 0 refills | Status: DC | PRN

## 2020-09-26 NOTE — Telephone Encounter (Signed)
Refill request Lorazepam Last office visit 06/01/20 Upcoming appointment 12/02/20 Last refill 08/01/20 #60

## 2020-09-26 NOTE — Telephone Encounter (Signed)
ERx 

## 2020-09-28 ENCOUNTER — Encounter: Payer: Self-pay | Admitting: Family Medicine

## 2020-09-28 DIAGNOSIS — K3189 Other diseases of stomach and duodenum: Secondary | ICD-10-CM

## 2020-09-28 DIAGNOSIS — R1013 Epigastric pain: Secondary | ICD-10-CM

## 2020-09-28 DIAGNOSIS — E611 Iron deficiency: Secondary | ICD-10-CM

## 2020-09-28 DIAGNOSIS — E559 Vitamin D deficiency, unspecified: Secondary | ICD-10-CM

## 2020-09-28 DIAGNOSIS — H18892 Other specified disorders of cornea, left eye: Secondary | ICD-10-CM | POA: Diagnosis not present

## 2020-09-28 DIAGNOSIS — Z2839 Other underimmunization status: Secondary | ICD-10-CM

## 2020-09-30 ENCOUNTER — Telehealth: Payer: Self-pay | Admitting: *Deleted

## 2020-09-30 ENCOUNTER — Telehealth: Payer: Self-pay | Admitting: Family Medicine

## 2020-09-30 NOTE — Telephone Encounter (Signed)
Pt wants to know if more test need to be ran because she has been diagnosed with gastritis. Having trouble with vitamins, minerals and deficiency due to condition and she want to test for those on Monday. Please advise

## 2020-09-30 NOTE — Telephone Encounter (Signed)
Please place future orders for lab appt.on Monday  Looks like pt also request additional labs. See other phone message

## 2020-10-01 ENCOUNTER — Encounter: Payer: Self-pay | Admitting: Family Medicine

## 2020-10-01 MED ORDER — ESOMEPRAZOLE MAGNESIUM 40 MG PO CPDR: 40.0000 mg | DELAYED_RELEASE_CAPSULE | Freq: Two times a day (BID) | ORAL | Status: DC

## 2020-10-01 MED ORDER — FAMOTIDINE 20 MG PO TABS: 20.0000 mg | ORAL_TABLET | Freq: Two times a day (BID) | ORAL | Status: DC

## 2020-10-01 NOTE — Telephone Encounter (Signed)
Labs ordered. [place]

## 2020-10-01 NOTE — Telephone Encounter (Signed)
Labs ordered. Thanks!

## 2020-10-03 ENCOUNTER — Other Ambulatory Visit (INDEPENDENT_AMBULATORY_CARE_PROVIDER_SITE_OTHER): Payer: Medicare Other

## 2020-10-03 ENCOUNTER — Other Ambulatory Visit: Payer: Self-pay

## 2020-10-03 ENCOUNTER — Other Ambulatory Visit: Payer: Medicare Other

## 2020-10-03 DIAGNOSIS — E611 Iron deficiency: Secondary | ICD-10-CM

## 2020-10-03 DIAGNOSIS — Z2839 Other underimmunization status: Secondary | ICD-10-CM

## 2020-10-03 DIAGNOSIS — R1013 Epigastric pain: Secondary | ICD-10-CM | POA: Diagnosis not present

## 2020-10-03 DIAGNOSIS — K3189 Other diseases of stomach and duodenum: Secondary | ICD-10-CM

## 2020-10-03 DIAGNOSIS — E559 Vitamin D deficiency, unspecified: Secondary | ICD-10-CM

## 2020-10-03 NOTE — Addendum Note (Signed)
Addended by: Leeanne Rio on: 10/03/2020 03:00 PM   Modules accepted: Orders

## 2020-10-03 NOTE — Addendum Note (Signed)
Addended by: Leeanne Rio on: 10/03/2020 03:01 PM   Modules accepted: Orders

## 2020-10-05 LAB — BASIC METABOLIC PANEL
BUN: 12 mg/dL (ref 7–25)
CO2: 30 mmol/L (ref 20–32)
Calcium: 9.2 mg/dL (ref 8.6–10.4)
Chloride: 100 mmol/L (ref 98–110)
Creat: 0.8 mg/dL (ref 0.50–1.03)
Glucose, Bld: 111 mg/dL — ABNORMAL HIGH (ref 65–99)
Potassium: 3.9 mmol/L (ref 3.5–5.3)
Sodium: 139 mmol/L (ref 135–146)

## 2020-10-05 LAB — MUMPS ANTIBODY, IGG: Mumps IgG: 10.4 AU/mL — ABNORMAL LOW

## 2020-10-05 LAB — FOLATE: Folate: 6.4 ng/mL

## 2020-10-05 LAB — IRON,TIBC AND FERRITIN PANEL
%SAT: 21 % (calc) (ref 16–45)
Ferritin: 44 ng/mL (ref 16–232)
Iron: 63 ug/dL (ref 45–160)
TIBC: 301 mcg/dL (calc) (ref 250–450)

## 2020-10-05 LAB — VITAMIN D 25 HYDROXY (VIT D DEFICIENCY, FRACTURES): Vit D, 25-Hydroxy: 33 ng/mL (ref 30–100)

## 2020-10-05 LAB — COPPER, SERUM: Copper: 143 ug/dL (ref 70–175)

## 2020-10-05 LAB — RUBELLA SCREEN: Rubella: 1.93 Index

## 2020-10-05 LAB — RUBEOLA ANTIBODY IGG: Rubeola IgG: 86.9 AU/mL

## 2020-10-05 NOTE — Telephone Encounter (Signed)
Plz schedule video visit for HA and sinus congestion.

## 2020-10-06 NOTE — Telephone Encounter (Signed)
Called patient and no answer.

## 2020-10-07 DIAGNOSIS — E063 Autoimmune thyroiditis: Secondary | ICD-10-CM | POA: Diagnosis not present

## 2020-10-07 DIAGNOSIS — E038 Other specified hypothyroidism: Secondary | ICD-10-CM | POA: Diagnosis not present

## 2020-10-12 DIAGNOSIS — K219 Gastro-esophageal reflux disease without esophagitis: Secondary | ICD-10-CM | POA: Diagnosis not present

## 2020-10-14 DIAGNOSIS — E038 Other specified hypothyroidism: Secondary | ICD-10-CM | POA: Diagnosis not present

## 2020-10-14 DIAGNOSIS — E063 Autoimmune thyroiditis: Secondary | ICD-10-CM | POA: Diagnosis not present

## 2020-10-18 DIAGNOSIS — R51 Headache with orthostatic component, not elsewhere classified: Secondary | ICD-10-CM | POA: Diagnosis not present

## 2020-10-18 DIAGNOSIS — M961 Postlaminectomy syndrome, not elsewhere classified: Secondary | ICD-10-CM | POA: Diagnosis not present

## 2020-10-18 DIAGNOSIS — G894 Chronic pain syndrome: Secondary | ICD-10-CM | POA: Diagnosis not present

## 2020-10-18 DIAGNOSIS — Z79891 Long term (current) use of opiate analgesic: Secondary | ICD-10-CM | POA: Diagnosis not present

## 2020-10-20 DIAGNOSIS — H16232 Neurotrophic keratoconjunctivitis, left eye: Secondary | ICD-10-CM | POA: Diagnosis not present

## 2020-10-20 DIAGNOSIS — G509 Disorder of trigeminal nerve, unspecified: Secondary | ICD-10-CM | POA: Diagnosis not present

## 2020-10-20 DIAGNOSIS — R200 Anesthesia of skin: Secondary | ICD-10-CM | POA: Diagnosis not present

## 2020-11-17 ENCOUNTER — Other Ambulatory Visit: Payer: Self-pay | Admitting: Family Medicine

## 2020-11-21 NOTE — Telephone Encounter (Signed)
Name of Medication: Lorazepam Name of Pharmacy: [facility] or Written Date and Quantity: 09/26/20, #60 Last Office Visit and Type: 06/01/20, AWV Next Office Visit and Type: 12/02/20, 6 mo f/u & iron labs Last Controlled Substance Agreement Date: 06/10/13 Last UDS: 06/15/20, scanned

## 2020-11-22 MED ORDER — LORAZEPAM 1 MG PO TABS: 0.5000 mg | ORAL_TABLET | Freq: Two times a day (BID) | ORAL | 0 refills | Status: DC | PRN

## 2020-11-22 NOTE — Telephone Encounter (Signed)
ERx 

## 2020-11-29 DIAGNOSIS — R519 Headache, unspecified: Secondary | ICD-10-CM | POA: Diagnosis not present

## 2020-11-29 DIAGNOSIS — Z79899 Other long term (current) drug therapy: Secondary | ICD-10-CM | POA: Diagnosis not present

## 2020-11-29 DIAGNOSIS — M961 Postlaminectomy syndrome, not elsewhere classified: Secondary | ICD-10-CM | POA: Diagnosis not present

## 2020-11-29 DIAGNOSIS — G894 Chronic pain syndrome: Secondary | ICD-10-CM | POA: Diagnosis not present

## 2020-12-02 ENCOUNTER — Encounter: Payer: Self-pay | Admitting: Family Medicine

## 2020-12-02 ENCOUNTER — Ambulatory Visit (INDEPENDENT_AMBULATORY_CARE_PROVIDER_SITE_OTHER): Payer: Medicare Other | Admitting: Family Medicine

## 2020-12-02 ENCOUNTER — Other Ambulatory Visit: Payer: Self-pay

## 2020-12-02 VITALS — BP 122/76 | HR 86 | Temp 97.8°F | Ht 67.0 in | Wt 172.6 lb

## 2020-12-02 DIAGNOSIS — F411 Generalized anxiety disorder: Secondary | ICD-10-CM

## 2020-12-02 DIAGNOSIS — K582 Mixed irritable bowel syndrome: Secondary | ICD-10-CM

## 2020-12-02 DIAGNOSIS — K529 Noninfective gastroenteritis and colitis, unspecified: Secondary | ICD-10-CM | POA: Diagnosis not present

## 2020-12-02 DIAGNOSIS — H04123 Dry eye syndrome of bilateral lacrimal glands: Secondary | ICD-10-CM

## 2020-12-02 DIAGNOSIS — H168 Other keratitis: Secondary | ICD-10-CM | POA: Diagnosis not present

## 2020-12-02 DIAGNOSIS — G500 Trigeminal neuralgia: Secondary | ICD-10-CM

## 2020-12-02 DIAGNOSIS — E039 Hypothyroidism, unspecified: Secondary | ICD-10-CM

## 2020-12-02 DIAGNOSIS — K219 Gastro-esophageal reflux disease without esophagitis: Secondary | ICD-10-CM | POA: Diagnosis not present

## 2020-12-02 NOTE — Progress Notes (Signed)
Patient ID: Karen Aguilar, female    DOB: 15-Apr-1965, 55 y.o.   MRN: [id number]  This visit was conducted in person.  BP 122/76   Pulse 86   Temp 97.8 F (36.6 C) (Temporal)   Ht 5\' 7"  (1.702 m)   Wt 172 lb 9 oz (78.3 kg)   LMP 10/04/2014 Comment: per labs  SpO2 95%   BMI 27.03 kg/m    CC: 6 mo f/u visit  Subjective:   HPI: Karen Aguilar is a 55 y.o. female presenting on 12/02/2020 for Follow-up (Here for 6 mo f/u. )   Seeing ophtho for h/o neurotrophic keratitis planning to start PRGF ([facility]). This presented with vision distortion.   GI - EGD 08/31/2020 showing reactive gastropathy despite nexium BID, pepcid and carafate. Found carafate was most helpful. Using BID to QID PRN. Notes ongoing globus sensation. Prior eval including esophageal manometry non-diagnostic. ?bile acid reflux - prior use of bile acid sequestrant (Welchol) led to hospitalization/bowel obstruction symptoms. Asks about natural bile acid binders.  Hypothyroidism - on armour thyroid 90mg  daily, 1/2 tab once weekly followed by endo ([facility]). Upcoming appt 01/2021.   Recent labs from 10/2020 reviewed - iron panel, vitamin D, folate, copper normal. She does take vit D 2000 IU when able as well as poly vi sol with iron (pediatric MVI).   Anxiety - continues lorazepam 0.5-1mg  BID PRN. Tries to limit due to eye dryness.      Relevant past medical, surgical, family and social history reviewed and updated as indicated. Interim medical history since our last visit reviewed. Allergies and medications reviewed and updated. Outpatient Medications Prior to Visit  Medication Sig Dispense Refill   Ascorbic Acid (VITAMIN C PO) Take 1 tablet by mouth as needed (for immune system support).      baclofen (LIORESAL) 10 MG tablet Take 10 mg by mouth 3 (three) times daily as needed. Takes 1.5 tablets 3 times a day.     calcium carbonate (TUMS EX) 750 MG chewable tablet Chew 2-3 tablets by mouth 2 (two) times daily.      camphor-menthol (SARNA) lotion Apply 1 application topically at bedtime as needed for itching.      Cholecalciferol (VITAMIN D) 2000 units CAPS Take 1 capsule (2,000 Units total) by mouth daily. 30 capsule    diclofenac sodium (VOLTAREN) 1 % GEL APPLY THIN LAYER EXTERNALLY TO THE AFFECTED AREA UP TO FOUR TIMES DAILY AS NEEDED 300 g 0   docusate sodium (COLACE) 100 MG capsule Take 100 mg by mouth 2 (two) times daily. As needed     LORazepam (ATIVAN) 1 MG tablet Take 0.5-1 tablets (0.5-1 mg total) by mouth 2 (two) times daily as needed for anxiety. 60 tablet 0   NUCYNTA ER 200 MG TB12 Take 1 tablet by mouth 2 (two) times daily.     omeprazole (PRILOSEC) 40 MG capsule Take 40 mg by mouth 2 (two) times daily.     pediatric multivitamin-iron (POLY-VI-SOL WITH IRON) 15 MG chewable tablet Chew 1 tablet by mouth daily.     promethazine (PHENERGAN) 25 MG tablet Take 1 tablet (25 mg total) by mouth every 8 (eight) hours as needed for nausea or vomiting. 30 tablet 0   sucralfate (CARAFATE) 1 g tablet Take 1 g by mouth 4 (four) times daily.     thyroid (ARMOUR) 90 MG tablet Take by mouth.     thyroid (ARMOUR) 90 MG tablet Take 1 tablet (90 mg total) by mouth daily.  One day a week take 1/2 tablet     chlorpheniramine-HYDROcodone (TUSSIONEX PENNKINETIC ER) 10-8 MG/5ML SUER Take 5 mLs by mouth every 12 (twelve) hours as needed for cough. 120 mL 0   esomeprazole (NEXIUM) 40 MG capsule Take 1 capsule (40 mg total) by mouth 2 (two) times daily before a meal.     famotidine (PEPCID) 20 MG tablet Take 1 tablet (20 mg total) by mouth 2 (two) times daily.     hyoscyamine (LEVSIN SL) 0.125 MG SL tablet DISSOLVE 1 TABLET(0.125 MG) UNDER THE TONGUE EVERY 4 HOURS AS NEEDED 30 tablet 3   thyroid (ARMOUR) 60 MG tablet Take 60 mg by mouth daily before breakfast. Two days a week take extra tablet daily     No facility-administered medications prior to visit.     Per HPI unless specifically indicated in ROS section  below Review of Systems  Objective:  BP 122/76   Pulse 86   Temp 97.8 F (36.6 C) (Temporal)   Ht 5\' 7"  (1.702 m)   Wt 172 lb 9 oz (78.3 kg)   LMP 10/04/2014 Comment: per labs  SpO2 95%   BMI 27.03 kg/m   Wt Readings from Last 3 Encounters:  12/02/20 172 lb 9 oz (78.3 kg)  07/20/20 167 lb 5.3 oz (75.9 kg)  06/01/20 167 lb 7 oz (75.9 kg)      Physical Exam Vitals and nursing note reviewed.  Constitutional:      Appearance: Normal appearance. She is not ill-appearing.  HENT:     Head: Normocephalic and atraumatic.  Eyes:     Extraocular Movements: Extraocular movements intact.     Pupils: Pupils are equal, round, and reactive to light.  Cardiovascular:     Rate and Rhythm: Normal rate and regular rhythm.     Pulses: Normal pulses.     Heart sounds: Normal heart sounds. No murmur heard. Pulmonary:     Effort: Pulmonary effort is normal. No respiratory distress.     Breath sounds: Normal breath sounds. No wheezing, rhonchi or rales.  Musculoskeletal:     Right lower leg: No edema.     Left lower leg: No edema.  Skin:    General: Skin is warm and dry.     Findings: No rash.  Neurological:     Mental Status: She is alert.  Psychiatric:        Mood and Affect: Mood normal.        Behavior: Behavior normal.      Results for orders placed or performed in visit on 10/03/20  Iron, TIBC and Ferritin Panel  Result Value Ref Range   Iron 63 45 - 160 mcg/dL   TIBC 301 250 - 450 mcg/dL (calc)   %SAT 21 16 - 45 % (calc)   Ferritin 44 16 - 232 ng/mL  VITAMIN D 25 Hydroxy (Vit-D Deficiency, Fractures)  Result Value Ref Range   Vit D, 25-Hydroxy 33 30 - 100 ng/mL  Basic metabolic panel  Result Value Ref Range   Glucose, Bld 111 (H) 65 - 99 mg/dL   BUN 12 7 - 25 mg/dL   Creat 0.80 0.50 - 1.03 mg/dL   BUN/Creatinine Ratio NOT APPLICABLE 6 - 22 (calc)   Sodium 139 135 - 146 mmol/L   Potassium 3.9 3.5 - 5.3 mmol/L   Chloride 100 98 - 110 mmol/L   CO2 30 20 - 32 mmol/L    Calcium 9.2 8.6 - 10.4 mg/dL  Folate  Result Value  Ref Range   Folate 6.4 ng/mL  Copper, Serum  Result Value Ref Range   Copper 143 70 - 175 mcg/dL  Rubella screen  Result Value Ref Range   Rubella 1.93 Index  Mumps antibody, IgG  Result Value Ref Range   Mumps IgG 10.40 (L) AU/mL  Rubeola antibody IgG  Result Value Ref Range   Rubeola IgG 86.90 AU/mL    Assessment & Plan:  This visit occurred during the SARS-CoV-2 public health emergency.  Safety protocols were in place, including screening questions prior to the visit, additional usage of staff PPE, and extensive cleaning of exam room while observing appropriate contact time as indicated for disinfecting solutions.   Problem List Items Addressed This Visit     Hypothyroidism    On armour thyroid  Appreciate endo care ([facility])       Relevant Medications   thyroid (ARMOUR) 90 MG tablet   GERD (gastroesophageal reflux disease) - Primary    Severe, stable period on current regimen of PPI BID and carafate 2-4 times daily PRN. Appreciate [facility].       Relevant Medications   omeprazole (PRILOSEC) 40 MG capsule   sucralfate (CARAFATE) 1 g tablet   Chronic diarrhea    Component of bile salt-induced diarrhea however previous welchol worsened abdominal pain with concern for bowel obstruction.  Discussed possible benefits of diet choices like okra, beets, asparagus, and home-prepared oatmeal.       Irritable bowel syndrome   Relevant Medications   omeprazole (PRILOSEC) 40 MG capsule   sucralfate (CARAFATE) 1 g tablet   GAD (generalized anxiety disorder)    Stable period on lorazepam PRN, although it causes some eye dryness      Dry eyes    Recent dx neurotrophic keratopathy presumed from trigeminal neuralgia       Trigeminal neuralgia   Neurotrophic keratitis of left eye    Seeing [facility], has started PRGF drops.  Thought sequelae of prior trigeminal neuralgia.  Appreciate ophtho care.         No  orders of the defined types were placed in this encounter.  No orders of the defined types were placed in this encounter.    Patient Instructions  Continue current medicines Look into steel cut oats to make oatmeal - may be natural way to bind bile.  You can youtube different oat options Return as needed or in 6 months for physical/wellness visit.   Follow up plan: Return in about 6 months (around 06/02/2021) for annual exam, prior fasting for blood work, medicare wellness visit.  Ria Bush, MD

## 2020-12-02 NOTE — Patient Instructions (Addendum)
Continue current medicines Look into steel cut oats to make oatmeal - may be natural way to bind bile.  You can youtube different oat options Return as needed or in 6 months for physical/wellness visit.

## 2020-12-03 DIAGNOSIS — H168 Other keratitis: Secondary | ICD-10-CM | POA: Insufficient documentation

## 2020-12-03 NOTE — Assessment & Plan Note (Addendum)
Seeing [facility], has started PRGF drops.  Thought sequelae of prior trigeminal neuralgia.  Appreciate ophtho care.

## 2020-12-03 NOTE — Assessment & Plan Note (Signed)
Recent dx neurotrophic keratopathy presumed from trigeminal neuralgia

## 2020-12-03 NOTE — Assessment & Plan Note (Signed)
Stable period on lorazepam PRN, although it causes some eye dryness

## 2020-12-03 NOTE — Assessment & Plan Note (Deleted)
Sees [facility].  Latest esophageal motility exam non-diagnostic

## 2020-12-03 NOTE — Assessment & Plan Note (Signed)
On armour thyroid  Appreciate endo care ([facility])

## 2020-12-03 NOTE — Assessment & Plan Note (Signed)
Severe, stable period on current regimen of PPI BID and carafate 2-4 times daily PRN. Appreciate [facility].

## 2020-12-03 NOTE — Assessment & Plan Note (Addendum)
Component of bile salt-induced diarrhea however previous welchol worsened abdominal pain with concern for bowel obstruction.  Discussed possible benefits of diet choices like okra, beets, asparagus, and home-prepared oatmeal.

## 2020-12-08 DIAGNOSIS — H04129 Dry eye syndrome of unspecified lacrimal gland: Secondary | ICD-10-CM | POA: Diagnosis not present

## 2020-12-08 DIAGNOSIS — H16232 Neurotrophic keratoconjunctivitis, left eye: Secondary | ICD-10-CM | POA: Diagnosis not present

## 2020-12-27 ENCOUNTER — Encounter (INDEPENDENT_AMBULATORY_CARE_PROVIDER_SITE_OTHER): Payer: Self-pay

## 2021-01-22 ENCOUNTER — Other Ambulatory Visit: Payer: Self-pay | Admitting: Family Medicine

## 2021-01-23 MED ORDER — LORAZEPAM 1 MG PO TABS: 0.5000 mg | ORAL_TABLET | Freq: Two times a day (BID) | ORAL | 0 refills | Status: DC | PRN

## 2021-01-23 NOTE — Telephone Encounter (Signed)
Refill request Ativan Last refill 11/22/20 #60 Last office visit 12/02/20 Upcoming appointment 01/24/21

## 2021-01-23 NOTE — Telephone Encounter (Signed)
ERx 

## 2021-01-24 ENCOUNTER — Ambulatory Visit: Payer: Medicare Other | Admitting: Family Medicine

## 2021-01-24 ENCOUNTER — Other Ambulatory Visit: Payer: Self-pay

## 2021-01-24 ENCOUNTER — Encounter: Payer: Self-pay | Admitting: Family Medicine

## 2021-01-24 ENCOUNTER — Ambulatory Visit (INDEPENDENT_AMBULATORY_CARE_PROVIDER_SITE_OTHER): Payer: Medicare Other

## 2021-01-24 VITALS — BP 118/82 | HR 93 | Temp 97.7°F | Ht 67.0 in | Wt 172.1 lb

## 2021-01-24 DIAGNOSIS — M5412 Radiculopathy, cervical region: Secondary | ICD-10-CM

## 2021-01-24 DIAGNOSIS — H168 Other keratitis: Secondary | ICD-10-CM | POA: Diagnosis not present

## 2021-01-24 DIAGNOSIS — H6192 Disorder of left external ear, unspecified: Secondary | ICD-10-CM | POA: Diagnosis not present

## 2021-01-24 MED ORDER — KETOROLAC TROMETHAMINE 30 MG/ML IJ SOLN
30.0000 mg | Freq: Once | INTRAMUSCULAR | Status: AC
  Administered 2021-01-24: 16:00:00 30 mg via INTRAMUSCULAR

## 2021-01-24 NOTE — Progress Notes (Signed)
Patient ID: Karen Aguilar, female    DOB: 04-07-1965, 56 y.o.   MRN: [id number]  This visit was conducted in person.  BP 118/82    Pulse 93    Temp 97.7 F (36.5 C) (Temporal)    Ht 5\' 7"  (1.702 m)    Wt 172 lb 2 oz (78.1 kg)    LMP 10/04/2014 Comment: per labs   SpO2 98%    BMI 26.96 kg/m    CC: headache and neck pain Subjective:   HPI: Karen Aguilar is a 56 y.o. female presenting on 01/24/2021 for Headache (C/o HA and pain in posterior right shoulder down into right arm.  Wants to discuss Toradol inj.  Also, c/o tinnitus in bilateral ears.  )   R headache, R neck pain with radiation down arm into hands, ongoing pain flare since Thanksgiving. Also associated with R hand weakness and bilateral hand cramping. No tremors noted. Treating with diclofenac gel with benefit. Avoids oral NSAIDs due to GI history. Wants to avoid steroids if possible. Pain clinic suggested she reach out to her PCP about this possibility.   Over the past month noticing bad frontal headaches (difficulty describing pain) associated with nausea, photo/phonophobia - which is different than typical migraine pain. 2 wks ago noted sore knot to anterior left ear, felt more than seen.   Over the weekend while leaning over to clean toilet, noted acute worsening of tinnitus as well as painful R ear vibration with high pitched loud sound.   No new medicines, vitamins, supplements.  They started vitamin C, D, and zinc combo pill.   Neurotrophic keratitis in h/o trigeminal neuralgia OS followed by [facility], treating with PRGF eye drops.      Relevant past medical, surgical, family and social history reviewed and updated as indicated. Interim medical history since our last visit reviewed. Allergies and medications reviewed and updated. Outpatient Medications Prior to Visit  Medication Sig Dispense Refill   Ascorbic Acid (VITAMIN C PO) Take 1 tablet by mouth as needed (for immune system support).      baclofen  (LIORESAL) 10 MG tablet Take 10 mg by mouth 3 (three) times daily as needed. Takes 1.5 tablets 3 times a day.     calcium carbonate (TUMS EX) 750 MG chewable tablet Chew 2-3 tablets by mouth 2 (two) times daily.     camphor-menthol (SARNA) lotion Apply 1 application topically at bedtime as needed for itching.      Cholecalciferol (VITAMIN D) 2000 units CAPS Take 1 capsule (2,000 Units total) by mouth daily. 30 capsule    diclofenac sodium (VOLTAREN) 1 % GEL APPLY THIN LAYER EXTERNALLY TO THE AFFECTED AREA UP TO FOUR TIMES DAILY AS NEEDED 300 g 0   docusate sodium (COLACE) 100 MG capsule Take 100 mg by mouth 2 (two) times daily. As needed     LORazepam (ATIVAN) 1 MG tablet Take 0.5-1 tablets (0.5-1 mg total) by mouth 2 (two) times daily as needed for anxiety. 60 tablet 0   NUCYNTA ER 200 MG TB12 Take 1 tablet by mouth 2 (two) times daily.     omeprazole (PRILOSEC) 40 MG capsule Take 40 mg by mouth 2 (two) times daily.     pediatric multivitamin-iron (POLY-VI-SOL WITH IRON) 15 MG chewable tablet Chew 1 tablet by mouth daily.     promethazine (PHENERGAN) 25 MG tablet Take 1 tablet (25 mg total) by mouth every 8 (eight) hours as needed for nausea or vomiting. [place]  tablet 0   sucralfate (CARAFATE) 1 g tablet Take 1 g by mouth 4 (four) times daily.     thyroid (ARMOUR) 90 MG tablet Take 1 tablet (90 mg total) by mouth daily. One day a week take 1/2 tablet     No facility-administered medications prior to visit.     Per HPI unless specifically indicated in ROS section below Review of Systems  Objective:  BP 118/82    Pulse 93    Temp 97.7 F (36.5 C) (Temporal)    Ht 5\' 7"  (1.702 m)    Wt 172 lb 2 oz (78.1 kg)    LMP 10/04/2014 Comment: per labs   SpO2 98%    BMI 26.96 kg/m   Wt Readings from Last 3 Encounters:  01/24/21 172 lb 2 oz (78.1 kg)  12/02/20 172 lb 9 oz (78.3 kg)  07/20/20 167 lb 5.3 oz (75.9 kg)      Physical Exam Vitals and nursing note reviewed.  Constitutional:      Appearance:  Normal appearance. She is not ill-appearing.  HENT:     Head: Normocephalic and atraumatic.     Right Ear: Tympanic membrane and ear canal normal. There is no impacted cerumen.     Left Ear: Tympanic membrane and ear canal normal. There is no impacted cerumen.     Ears:      Comments: Nub of cartilage anterior external L ear as continuation of upper pinna superior to tragus - more prominent than on right side    Mouth/Throat:     Mouth: Mucous membranes are moist.     Pharynx: Oropharynx is clear. No oropharyngeal exudate or posterior oropharyngeal erythema.  Eyes:     Extraocular Movements: Extraocular movements intact.     Pupils: Pupils are equal, round, and reactive to light.  Neck:     Thyroid: No thyroid mass or thyromegaly.     Comments:  Limited ROM cervical neck to right in lateral flexion and lateral rotation due to pain Lower cervical midline discomfort to palpation  Pain to palpation of trapezius mm R>L Cardiovascular:     Rate and Rhythm: Normal rate and regular rhythm.     Pulses: Normal pulses.     Heart sounds: Normal heart sounds. No murmur heard. Pulmonary:     Effort: Pulmonary effort is normal. No respiratory distress.     Breath sounds: Normal breath sounds. No wheezing, rhonchi or rales.  Musculoskeletal:        General: Tenderness present. No swelling.     Cervical back: Normal range of motion and neck supple. No tenderness.     Right lower leg: No edema.     Left lower leg: No edema.     Comments:  FROM at shoulders, albeit with discomfort Crepitus without marked pain with rotation of humeral head in [facility] joint   Lymphadenopathy:     Cervical: No cervical adenopathy.  Skin:    General: Skin is warm and dry.     Findings: No rash.  Neurological:     General: No focal deficit present.     Mental Status: She is alert. Mental status is at baseline.     Cranial Nerves: No cranial nerve deficit.     Comments:  CN 2-12 intact FTN intact EOMI No pronator  drift 5/5 strength BUE, grip strength intact Painful spurling test on right  Psychiatric:        Mood and Affect: Mood normal.  Behavior: Behavior normal.      Results for orders placed or performed in visit on 10/03/20  Iron, TIBC and Ferritin Panel  Result Value Ref Range   Iron 63 45 - 160 mcg/dL   TIBC 301 250 - 450 mcg/dL (calc)   %SAT 21 16 - 45 % (calc)   Ferritin 44 16 - 232 ng/mL  VITAMIN D 25 Hydroxy (Vit-D Deficiency, Fractures)  Result Value Ref Range   Vit D, 25-Hydroxy 33 30 - 100 ng/mL  Basic metabolic panel  Result Value Ref Range   Glucose, Bld 111 (H) 65 - 99 mg/dL   BUN 12 7 - 25 mg/dL   Creat 0.80 0.50 - 1.03 mg/dL   BUN/Creatinine Ratio NOT APPLICABLE 6 - 22 (calc)   Sodium 139 135 - 146 mmol/L   Potassium 3.9 3.5 - 5.3 mmol/L   Chloride 100 98 - 110 mmol/L   CO2 30 20 - 32 mmol/L   Calcium 9.2 8.6 - 10.4 mg/dL  Folate  Result Value Ref Range   Folate 6.4 ng/mL  Copper, Serum  Result Value Ref Range   Copper 143 70 - 175 mcg/dL  Rubella screen  Result Value Ref Range   Rubella 1.93 Index  Mumps antibody, IgG  Result Value Ref Range   Mumps IgG 10.40 (L) AU/mL  Rubeola antibody IgG  Result Value Ref Range   Rubeola IgG 86.90 AU/mL  GFR = 70s  MRI CERVICAL SPINE WITHOUT CONTRAST IMPRESSION: 1. Interval ACDF procedures from C4 through C7. Solid union with wide patency of the canal and foramina from C4 through C6. Mild bilateral foraminal encroachment by bone at C6-7. This would have some potential to affect the C7 nerves. 2. C7-T1: Bilateral uncovertebral prominence with bilateral foraminal narrowing, right worse than left. Some potential to affect either or both C8 nerves, particularly the right. 3. C3-4: Mild disc bulge. Mild right foraminal narrowing due to facet hypertrophy. Electronically Signed   By: Nelson Chimes M.D.   On: 10/06/2019 13:22  Assessment & Plan:  This visit occurred during the SARS-CoV-2 public health emergency.   Safety protocols were in place, including screening questions prior to the visit, additional usage of staff PPE, and extensive cleaning of exam room while observing appropriate contact time as indicated for disinfecting solutions.   Problem List Items Addressed This Visit     Neurotrophic keratitis of left eye    Continues seen [facility] on PRGF eye drops      Radiculitis of right cervical region - Primary    Reviewed MRI from 11/2019 showing evidence of foraminal stenosis R>L at lower cervical region which could account for current symptoms. reasonable to provide toradol 30mg  IM today, rec further supportive care with diclofenac, heating pad, gentle stretching.  Will also update cervical neck films.  She is s/p ACDF and PT. She has been recommended against steroid injections by pain clinic. If worsening, consider updated advanced imaging.       Relevant Orders   DG Cervical Spine Complete   Earlobe lesion, left    Anticipate prominent cartilage as cause of nub/nodule noted today - likely benign. Update [place] if enlarging or changing.         Meds ordered this encounter  Medications   ketorolac (TORADOL) 30 MG/ML injection 30 mg   Orders Placed This Encounter  Procedures   DG Cervical Spine Complete    Standing Status:   Future    Number of Occurrences:   1  Standing Expiration Date:   01/24/2022    Order Specific Question:   Reason for Exam (SYMPTOM  OR DIAGNOSIS REQUIRED)    Answer:   R cervical radiculopathy    Order Specific Question:   Is patient pregnant?    Answer:   No    Order Specific Question:   Preferred imaging location?    Answer:   [facility]     Patient Instructions  Neck Xrays today Toradol 30mg  shot today - update [place] with effect.  Continue gentle stretching of neck, heating pad.  For spot on ear - feels like piece of cartilage/part of external ear. Let us know if enlarging or worsening.   Follow up plan: Return if symptoms worsen or fail  to improve.  Ria Bush, MD

## 2021-01-24 NOTE — Patient Instructions (Addendum)
Neck Xrays today Toradol 30mg  shot today - update [place] with effect.  Continue gentle stretching of neck, heating pad.  For spot on ear - feels like piece of cartilage/part of external ear. Let us know if enlarging or worsening.

## 2021-01-25 DIAGNOSIS — H6192 Disorder of left external ear, unspecified: Secondary | ICD-10-CM | POA: Insufficient documentation

## 2021-01-25 NOTE — Assessment & Plan Note (Addendum)
Reviewed MRI from 11/2019 showing evidence of foraminal stenosis R>L at lower cervical region which could account for current symptoms. reasonable to provide toradol 30mg  IM today, rec further supportive care with diclofenac, heating pad, gentle stretching.  Will also update cervical neck films.  She is s/p ACDF and PT. She has been recommended against steroid injections by pain clinic. If worsening, consider updated advanced imaging.

## 2021-01-25 NOTE — Assessment & Plan Note (Signed)
Anticipate prominent cartilage as cause of nub/nodule noted today - likely benign. Update [place] if enlarging or changing.

## 2021-01-25 NOTE — Assessment & Plan Note (Signed)
Continues seen [facility] on PRGF eye drops

## 2021-02-19 ENCOUNTER — Emergency Department: Payer: Medicare Other

## 2021-02-19 ENCOUNTER — Other Ambulatory Visit: Payer: Self-pay

## 2021-02-19 ENCOUNTER — Inpatient Hospital Stay
Admission: EM | Admit: 2021-02-19 | Discharge: 2021-02-21 | DRG: 872 | Disposition: A | Discharge status: Home / Self Care | Payer: Medicare Other | Attending: Internal Medicine | Admitting: Internal Medicine

## 2021-02-19 DIAGNOSIS — F32A Depression, unspecified: Secondary | ICD-10-CM | POA: Diagnosis present

## 2021-02-19 DIAGNOSIS — Z801 Family history of malignant neoplasm of trachea, bronchus and lung: Secondary | ICD-10-CM

## 2021-02-19 DIAGNOSIS — A0811 Acute gastroenteropathy due to Norwalk agent: Secondary | ICD-10-CM

## 2021-02-19 DIAGNOSIS — A419 Sepsis, unspecified organism: Principal | ICD-10-CM

## 2021-02-19 DIAGNOSIS — Z800 Family history of malignant neoplasm of digestive organs: Secondary | ICD-10-CM | POA: Diagnosis not present

## 2021-02-19 DIAGNOSIS — Z8349 Family history of other endocrine, nutritional and metabolic diseases: Secondary | ICD-10-CM | POA: Diagnosis not present

## 2021-02-19 DIAGNOSIS — G500 Trigeminal neuralgia: Secondary | ICD-10-CM | POA: Diagnosis present

## 2021-02-19 DIAGNOSIS — Z981 Arthrodesis status: Secondary | ICD-10-CM

## 2021-02-19 DIAGNOSIS — E785 Hyperlipidemia, unspecified: Secondary | ICD-10-CM | POA: Diagnosis present

## 2021-02-19 DIAGNOSIS — I959 Hypotension, unspecified: Secondary | ICD-10-CM | POA: Diagnosis present

## 2021-02-19 DIAGNOSIS — Z882 Allergy status to sulfonamides status: Secondary | ICD-10-CM

## 2021-02-19 DIAGNOSIS — Z820 Family history of epilepsy and other diseases of the nervous system: Secondary | ICD-10-CM

## 2021-02-19 DIAGNOSIS — E559 Vitamin D deficiency, unspecified: Secondary | ICD-10-CM | POA: Diagnosis present

## 2021-02-19 DIAGNOSIS — Z833 Family history of diabetes mellitus: Secondary | ICD-10-CM | POA: Diagnosis not present

## 2021-02-19 DIAGNOSIS — Z20822 Contact with and (suspected) exposure to covid-19: Secondary | ICD-10-CM | POA: Diagnosis present

## 2021-02-19 DIAGNOSIS — A09 Infectious gastroenteritis and colitis, unspecified: Secondary | ICD-10-CM | POA: Diagnosis present

## 2021-02-19 DIAGNOSIS — R6520 Severe sepsis without septic shock: Secondary | ICD-10-CM | POA: Diagnosis present

## 2021-02-19 DIAGNOSIS — K589 Irritable bowel syndrome without diarrhea: Secondary | ICD-10-CM | POA: Diagnosis present

## 2021-02-19 DIAGNOSIS — Z8249 Family history of ischemic heart disease and other diseases of the circulatory system: Secondary | ICD-10-CM

## 2021-02-19 DIAGNOSIS — Z881 Allergy status to other antibiotic agents status: Secondary | ICD-10-CM

## 2021-02-19 DIAGNOSIS — Z887 Allergy status to serum and vaccine status: Secondary | ICD-10-CM

## 2021-02-19 DIAGNOSIS — Z888 Allergy status to other drugs, medicaments and biological substances status: Secondary | ICD-10-CM

## 2021-02-19 DIAGNOSIS — M797 Fibromyalgia: Secondary | ICD-10-CM | POA: Diagnosis present

## 2021-02-19 DIAGNOSIS — E038 Other specified hypothyroidism: Secondary | ICD-10-CM | POA: Diagnosis present

## 2021-02-19 DIAGNOSIS — E872 Acidosis, unspecified: Secondary | ICD-10-CM | POA: Diagnosis present

## 2021-02-19 DIAGNOSIS — Z8661 Personal history of infections of the central nervous system: Secondary | ICD-10-CM

## 2021-02-19 DIAGNOSIS — L270 Generalized skin eruption due to drugs and medicaments taken internally: Secondary | ICD-10-CM | POA: Diagnosis not present

## 2021-02-19 DIAGNOSIS — Z803 Family history of malignant neoplasm of breast: Secondary | ICD-10-CM

## 2021-02-19 DIAGNOSIS — Z87442 Personal history of urinary calculi: Secondary | ICD-10-CM | POA: Diagnosis not present

## 2021-02-19 DIAGNOSIS — F419 Anxiety disorder, unspecified: Secondary | ICD-10-CM | POA: Diagnosis present

## 2021-02-19 DIAGNOSIS — R000 Tachycardia, unspecified: Secondary | ICD-10-CM | POA: Diagnosis present

## 2021-02-19 DIAGNOSIS — T368X5A Adverse effect of other systemic antibiotics, initial encounter: Secondary | ICD-10-CM | POA: Diagnosis not present

## 2021-02-19 DIAGNOSIS — K5790 Diverticulosis of intestine, part unspecified, without perforation or abscess without bleeding: Secondary | ICD-10-CM | POA: Diagnosis present

## 2021-02-19 DIAGNOSIS — Z9049 Acquired absence of other specified parts of digestive tract: Secondary | ICD-10-CM

## 2021-02-19 DIAGNOSIS — K219 Gastro-esophageal reflux disease without esophagitis: Secondary | ICD-10-CM | POA: Diagnosis present

## 2021-02-19 DIAGNOSIS — K529 Noninfective gastroenteritis and colitis, unspecified: Secondary | ICD-10-CM

## 2021-02-19 DIAGNOSIS — F418 Other specified anxiety disorders: Secondary | ICD-10-CM

## 2021-02-19 DIAGNOSIS — E039 Hypothyroidism, unspecified: Secondary | ICD-10-CM | POA: Diagnosis present

## 2021-02-19 DIAGNOSIS — Z79899 Other long term (current) drug therapy: Secondary | ICD-10-CM

## 2021-02-19 DIAGNOSIS — E063 Autoimmune thyroiditis: Secondary | ICD-10-CM | POA: Diagnosis present

## 2021-02-19 DIAGNOSIS — G894 Chronic pain syndrome: Secondary | ICD-10-CM | POA: Diagnosis present

## 2021-02-19 DIAGNOSIS — G43909 Migraine, unspecified, not intractable, without status migrainosus: Secondary | ICD-10-CM | POA: Diagnosis present

## 2021-02-19 DIAGNOSIS — E611 Iron deficiency: Secondary | ICD-10-CM | POA: Diagnosis present

## 2021-02-19 HISTORY — DX: Acute gastroenteropathy due to Norwalk agent: A08.11

## 2021-02-19 LAB — COMPREHENSIVE METABOLIC PANEL
ALT: 18 U/L (ref 0–44)
AST: 30 U/L (ref 15–41)
Albumin: 4.3 g/dL (ref 3.5–5.0)
Alkaline Phosphatase: 88 U/L (ref 38–126)
Anion gap: 16 — ABNORMAL HIGH (ref 5–15)
BUN: 20 mg/dL (ref 6–20)
CO2: 25 mmol/L (ref 22–32)
Calcium: 9.7 mg/dL (ref 8.9–10.3)
Chloride: 98 mmol/L (ref 98–111)
Creatinine, Ser: 1.05 mg/dL — ABNORMAL HIGH (ref 0.44–1.00)
GFR, Estimated: >60 mL/min (ref >60)
Glucose, Bld: 161 mg/dL — ABNORMAL HIGH (ref 70–99)
Potassium: 3.6 mmol/L (ref 3.5–5.1)
Sodium: 139 mmol/L (ref 135–145)
Total Bilirubin: 0.8 mg/dL (ref 0.3–1.2)
Total Protein: 8.4 g/dL — ABNORMAL HIGH (ref 6.5–8.1)

## 2021-02-19 LAB — CBC WITH DIFFERENTIAL/PLATELET
Abs Immature Granulocytes: 0.08 10*3/uL — ABNORMAL HIGH (ref 0.00–0.07)
Basophils Absolute: 0 10*3/uL (ref 0.0–0.1)
Basophils Relative: 0 %
Eosinophils Absolute: 0 10*3/uL (ref 0.0–0.5)
Eosinophils Relative: 0 %
HCT: 49 % — ABNORMAL HIGH (ref 36.0–46.0)
Hemoglobin: 16.3 g/dL — ABNORMAL HIGH (ref 12.0–15.0)
Immature Granulocytes: 1 %
Lymphocytes Relative: 3 %
Lymphs Abs: 0.4 10*3/uL — ABNORMAL LOW (ref 0.7–4.0)
MCH: 28 pg (ref 26.0–34.0)
MCHC: 33.3 g/dL (ref 30.0–36.0)
MCV: 84.2 fL (ref 80.0–100.0)
Monocytes Absolute: 0.2 10*3/uL (ref 0.1–1.0)
Monocytes Relative: 1 %
Neutro Abs: 14.1 10*3/uL — ABNORMAL HIGH (ref 1.7–7.7)
Neutrophils Relative %: 95 %
Platelets: 395 10*3/uL (ref 150–400)
RBC: 5.82 MIL/uL — ABNORMAL HIGH (ref 3.87–5.11)
RDW: 12.1 % (ref 11.5–15.5)
WBC: 14.9 10*3/uL — ABNORMAL HIGH (ref 4.0–10.5)
nRBC: 0 % (ref 0.0–0.2)

## 2021-02-19 LAB — PROTIME-INR
INR: 0.9 (ref 0.8–1.2)
Prothrombin Time: 12.5 seconds (ref 11.4–15.2)

## 2021-02-19 LAB — LIPASE, BLOOD: Lipase: 28 U/L (ref 11–51)

## 2021-02-19 LAB — RESP PANEL BY RT-PCR (FLU A&B, COVID) ARPGX2
Influenza A by PCR: NEGATIVE
Influenza B by PCR: NEGATIVE
SARS Coronavirus 2 by RT PCR: NEGATIVE

## 2021-02-19 LAB — LACTIC ACID, PLASMA: Lactic Acid, Venous: 3.2 mmol/L (ref 0.5–1.9)

## 2021-02-19 LAB — TROPONIN I (HIGH SENSITIVITY): Troponin I (High Sensitivity): 6 ng/L (ref <18)

## 2021-02-19 MED ORDER — ONDANSETRON HCL 4 MG PO TABS: 4.0000 mg | ORAL_TABLET | Freq: Four times a day (QID) | ORAL | Status: DC | PRN

## 2021-02-19 MED ORDER — LACTATED RINGERS IV SOLN: INTRAVENOUS | Status: AC

## 2021-02-19 MED ORDER — LORAZEPAM 0.5 MG PO TABS
0.5000 mg | ORAL_TABLET | Freq: Two times a day (BID) | ORAL | Status: DC | PRN
  Administered 2021-02-20 (×2): 1 mg via ORAL
  Filled 2021-02-19 (×2): qty 2

## 2021-02-19 MED ORDER — LORAZEPAM 2 MG/ML IJ SOLN
1.0000 mg | Freq: Once | INTRAMUSCULAR | Status: AC
  Administered 2021-02-19: 1 mg via INTRAVENOUS
  Filled 2021-02-19: qty 1

## 2021-02-19 MED ORDER — IOHEXOL 300 MG/ML  SOLN
100.0000 mL | Freq: Once | INTRAMUSCULAR | Status: AC | PRN
  Administered 2021-02-19: 100 mL via INTRAVENOUS

## 2021-02-19 MED ORDER — VANCOMYCIN HCL IN DEXTROSE 1-5 GM/200ML-% IV SOLN: 1000.0000 mg | Freq: Once | INTRAVENOUS | Status: DC

## 2021-02-19 MED ORDER — PANTOPRAZOLE SODIUM 40 MG IV SOLR
40.0000 mg | INTRAVENOUS | Status: DC
  Administered 2021-02-20: 40 mg via INTRAVENOUS
  Filled 2021-02-19: qty 10

## 2021-02-19 MED ORDER — SODIUM CHLORIDE 0.9 % IV SOLN
2.0000 g | Freq: Once | INTRAVENOUS | Status: AC
  Administered 2021-02-19: 2 g via INTRAVENOUS
  Filled 2021-02-19: qty 2

## 2021-02-19 MED ORDER — METRONIDAZOLE 500 MG/100ML IV SOLN
500.0000 mg | Freq: Once | INTRAVENOUS | Status: AC
  Administered 2021-02-19: 500 mg via INTRAVENOUS
  Filled 2021-02-19: qty 100

## 2021-02-19 MED ORDER — METRONIDAZOLE 500 MG/100ML IV SOLN
500.0000 mg | Freq: Two times a day (BID) | INTRAVENOUS | Status: DC
  Administered 2021-02-20 (×2): 500 mg via INTRAVENOUS
  Filled 2021-02-19 (×3): qty 100

## 2021-02-19 MED ORDER — VANCOMYCIN HCL 1750 MG/350ML IV SOLN
1750.0000 mg | Freq: Once | INTRAVENOUS | Status: AC
  Administered 2021-02-19: 1750 mg via INTRAVENOUS
  Filled 2021-02-19: qty 350

## 2021-02-19 MED ORDER — ONDANSETRON HCL 4 MG/2ML IJ SOLN
4.0000 mg | Freq: Four times a day (QID) | INTRAMUSCULAR | Status: DC | PRN
  Administered 2021-02-20: 4 mg via INTRAVENOUS
  Filled 2021-02-19: qty 2

## 2021-02-19 MED ORDER — ACETAMINOPHEN 650 MG RE SUPP: 650.0000 mg | Freq: Four times a day (QID) | RECTAL | Status: DC | PRN

## 2021-02-19 MED ORDER — ENOXAPARIN SODIUM 40 MG/0.4ML IJ SOSY
40.0000 mg | PREFILLED_SYRINGE | INTRAMUSCULAR | Status: DC
  Administered 2021-02-20 – 2021-02-21 (×2): 40 mg via SUBCUTANEOUS
  Filled 2021-02-19 (×2): qty 0.4

## 2021-02-19 MED ORDER — LACTATED RINGERS IV BOLUS (SEPSIS)
1000.0000 mL | Freq: Once | INTRAVENOUS | Status: AC
  Administered 2021-02-19: 1000 mL via INTRAVENOUS

## 2021-02-19 MED ORDER — LACTATED RINGERS IV BOLUS (SEPSIS)
500.0000 mL | Freq: Once | INTRAVENOUS | Status: AC
  Administered 2021-02-20: 500 mL via INTRAVENOUS

## 2021-02-19 MED ORDER — ONDANSETRON HCL 4 MG/2ML IJ SOLN
4.0000 mg | Freq: Once | INTRAMUSCULAR | Status: AC
  Administered 2021-02-19: 4 mg via INTRAVENOUS
  Filled 2021-02-19: qty 2

## 2021-02-19 MED ORDER — HYDROCODONE-ACETAMINOPHEN 5-325 MG PO TABS: 1.0000 | ORAL_TABLET | ORAL | Status: DC | PRN

## 2021-02-19 MED ORDER — ACETAMINOPHEN 325 MG PO TABS
650.0000 mg | ORAL_TABLET | Freq: Four times a day (QID) | ORAL | Status: DC | PRN
Start: 2021-02-19 — End: 2021-02-21
  Administered 2021-02-20: 650 mg via ORAL
  Filled 2021-02-19: qty 2

## 2021-02-19 NOTE — H&P (Signed)
History and Physical    Patient: Karen Aguilar OZH:[id number] DOB: 1965/02/18 DOA: 02/19/2021 DOS: the patient was seen and examined on 02/19/2021 PCP: Ria Bush, MD  Patient coming from: Home  Chief Complaint:  Chief Complaint  Patient presents with   Tachycardia    HPI: Karen Aguilar is a 56 y.o. female with medical history significant of Irritable bowel syndrome, esophageal dysphagia followed by [place] [facility] last seen 01/25/2021, anxiety and depression, thyroid disease, fibromyalgia and chronic pain syndrome, trigeminal neuropathy, thyroid disease, who presents to the ED with a complaint of diarrhea x3 days and vomiting that started on the day of arrival.  Diarrhea is nonbloody and nonmelanotic and emesis is nonbloody and nonbilious and non-coffee-ground.  Denies abdominal pain.  Denies fever or chills, cough or shortness of breath.  ED course: On arrival temperature 100, pulse 160, respirations 30.  BP 87/47 but probably erroneous as was subsequently 162/150 without intervention.  Currently 122/68 Blood work: WBC 15,000 with lactic acid 3.2 Urinalysis pending CMP unremarkable.  Lipase normal at 28 Troponin 6  EKG, personally viewed and interpreted: Sinus tachycardia at 151 with no acute ST-T wave changes  Imaging: CT abdomen and pelvis without acute abnormality.  Chronic stable liver hemangiomas since 2014 Chest x-ray with no acute chest findings  Patient started on sepsis fluids, cefepime, Vanco and metronidazole for sepsis of unknown source.  Hospitalist consulted for admission.    Data Reviewed: Relevant notes from primary care and specialist visits, past discharge summaries available in EHR, including Care Everywhere. Prior diagnostic testing as pertinent to current admission diagnoses Updated medications and problem lists for reconciliation ED course, including vitals, labs, imaging, treatment and response to treatment Triage notes and ED providers  notes Notable results as noted in HPI    Review of Systems: As mentioned in the history of present illness. All other systems reviewed and are negative. Past Medical History:  Diagnosis Date   Abnormal MRI scan, bone 2012   abnormal marrow signal humeral head, glenoid and scapula - diffuse replacement of fatty marrow - likely benign, no need for further investigation [place])   Agnosia for temperature    Right Leg   Anemia    etiology unknown-iron infusion in past,last 9/12   Anxiety    Anxiety and depression    Arachnoiditis    S1 nerve root, 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 Eartha Inch Pain   COVID-19 virus infection 09/2018   DDD (degenerative disc disease), cervical    DDD (degenerative disc disease), lumbar    s/p permanent nerve damage after back surgery   Depression    Depression with anxiety    Diverticulosis    Eczema    Family history of adverse reaction to anesthesia    Mother - nausea   Fibromyalgia    Foot drop    right - numbness, tingling - twisting   Gallstones    GERD (gastroesophageal reflux disease)    rare zantac PRN N/V at times with this   Headache(784.0)    migraines   Hemorrhoid    Hemorrhoids    History of kidney stones    Hyperlipidemia    Hypothyroidism 1990s   hashimoto's thyroiditis   IBS (irritable bowel syndrome)    IBS (irritable bowel syndrome)    Incontinence of urine    s/p sling procedure-unresolved   Iron deficiency    h/o anemia, s/p iron infusion (2012)   Liver  hemangioma     x2   Memory change    due to medications   Migraines    and h/o optical migraine   Migraines    Mixed urge and stress incontinence 2008   extensive workup including urodynamics, failed miltiple anticholinergics including myrbetriq and urogesic blue s/p PTNS, normal cystoscopy 2014, seen by Dr Matilde Sprang and Dr Erlene Quan   Neurogenic bladder 2008   after 13 spine surgeries with periph neuropathy and chronic LBP    Neuropathy of lower extremity 2011   Nocturia    Notalgia paresthetica    Osteoarthritis 12/2013   PONV (postoperative nausea and vomiting)    Pulmonary nodule 04/2012   7.22mm RLL nodule - resolved on imaging 12/2012   Sleep apnea    mild-doesn't use cpap   Urine frequency    Vitamin D deficiency    Past Surgical History:  Procedure Laterality Date   ANTERIOR CERVICAL DECOMP/DISCECTOMY FUSION     C3, 4, 5 - improved after surgery   ANTERIOR CERVICAL DECOMP/DISCECTOMY FUSION  07/2017   C6/7 Dr Pamala Hurry   ARTERY BIOPSY Left 09/02/2019   Procedure: BIOPSY TEMPORAL ARTERY;  Surgeon: [name], MD;  Location: [facility] ORS;  Service: General;  Laterality: Left;   BACK SURGERY     multiple-L4-s1,hardware,fusion,removal,infection s/p 5/11 procedure   BACK SURGERY  02/16/2011   "my 7th back OR; Procedure: Exploration of fusion removal of hardware L4-S1, harvesting of right iliac crest bone graft, redo posterior lateral fusion L4-5 using iliac crest bone graft BMP and master graft replacement of bilateral L4 screws with a Telfa tech 7.5 x 40 mm screw on the right and 8.5 x 40 mm screw on the left placement of a large Hemovac drain   [place]; 2001   x 2   CHOLECYSTECTOMY  2001   COLONOSCOPY  10/2016   rpt 10 yrs Gustavo Lah)   COLONOSCOPY  03/2019   1 polyp, biopsies taken ([place])   ESOPHAGEAL MANOMETRY  06/2017   WNL (Dr Earnie Larsson)   ESOPHAGOGASTRODUODENOSCOPY  03/2013   early esophageal stricture dilated, o/w WNL Deatra Ina)   ESOPHAGOGASTRODUODENOSCOPY  12/2014   WNL, s/p empiric dilation of esophagus Henrene Pastor)   ESOPHAGOGASTRODUODENOSCOPY  10/2015   WNL, biopsies WNL as well (minimal esophagitis with reactive changes) [place])   ESOPHAGOGASTRODUODENOSCOPY  10/2016   biopsy suggestive of ulcer ([place])   ESOPHAGOGASTRODUODENOSCOPY  03/2019   mild portal hypertensive gastropathy, gastric erosion without bleeding, duodenal diverticulum ([place])   ESOPHAGOGASTRODUODENOSCOPY   09/2020   normal esophagus with mild edema, no gastritis, H pylori neg (Dr Redmond Pulling at [facility])   ESOPHAGOGASTRODUODENOSCOPY  08/31/2020   biopsy with reactive gastropathy ([facility])   HEMORRHOID BANDING     LASIK Bilateral    LUMBAR WOUND DEBRIDEMENT  03/15/2011   Procedure: LUMBAR WOUND DEBRIDEMENT;  Surgeon: Elaina Hoops, MD;  Lumbar Wound Debridement   MANDIBLE RECONSTRUCTION  1986   PFT  01/28/2013   WNL   PUBOVAGINAL SLING  2008   midurethral sling   SHOULDER CLOSED REDUCTION Left 12/18/2010   Procedure: CLOSED MANIPULATION SHOULDER;  Surgeon: Johnn Hai; labrial debridement   SPINAL CORD STIMULATOR INSERTION N/A 08/19/2015   Procedure: LUMBAR SPINAL CORD STIMULATOR INSERTION;  Surgeon: Erline Levine, MD;  Location: [place] [facility];  Service: Neurosurgery;  Laterality: N/A;  LUMBAR SPINAL CORD STIMULATOR INSERTION   [place]   as a child; "then they grew back"   Social History:  reports  that she has never smoked. She has never used smokeless tobacco. She reports current alcohol use. She reports that she does not use drugs.  Allergies  Allergen Reactions   Augmentin [Amoxicillin-Pot Clavulanate] Swelling    Lip swelling and irritation.    Fluconazole Swelling    Lips - swelling, bleeding, blisters, cracking, peeling   Erythromycin Nausea And Vomiting   Influenza Vaccines Hives and Swelling    Redness, heat, and extreme swelling at site   Levothyroxine Hives   Thimerosal Swelling    Local reaction-redness,swelling; veins popping   Tomato Other (See Comments)    Severe acid reflux with all acid foods   Adhesive [Tape] Rash    blisters   Azithromycin Rash   Gabapentin Other (See Comments)    Short term memory loss   Imitrex [Sumatriptan] Other (See Comments)    Rebound migraine    Lyrica [Pregabalin] Other (See Comments)    Short term memory loss   Macrodantin [Nitrofurantoin] Rash   Macrolides And Ketolides Rash   Nickel Swelling and Rash    Sulfa Antibiotics Rash   Sulfasalazine Rash   Troleandomycin Rash    Family History  Problem Relation Age of Onset   Lung cancer Father 33        (smoker)   Diabetes Brother    Thyroid disease Mother        ?empty sella   Irritable bowel syndrome Mother    Dysphagia Mother    CAD Maternal Grandfather        MI   Heart disease Maternal Grandfather    Heart attack Maternal Grandfather    Lung cancer Paternal Grandfather         (smoker)   Colon cancer Paternal Grandmother    Thyroid disease Cousin    Cancer Other        unsure (ovarian/uterine)   Breast cancer Maternal Aunt    Alzheimer's disease Maternal Grandmother    Diabetes Daughter     Prior to Admission medications   Medication Sig Start Date End Date Taking? Authorizing Provider  Ascorbic Acid (VITAMIN C PO) Take 1 tablet by mouth as needed (for immune system support).     [provider]  baclofen (LIORESAL) 10 MG tablet Take 10 mg by mouth 3 (three) times daily as needed. Takes 1.5 tablets 3 times a day. 04/06/20   [provider]  calcium carbonate (TUMS EX) 750 MG chewable tablet Chew 2-3 tablets by mouth 2 (two) times daily.    [provider]  camphor-menthol Timoteo Ace) lotion Apply 1 application topically at bedtime as needed for itching.     [provider]  Cholecalciferol (VITAMIN D) 2000 units CAPS Take 1 capsule (2,000 Units total) by mouth daily. 10/10/15   Ria Bush, MD  diclofenac sodium (VOLTAREN) 1 % GEL APPLY THIN LAYER EXTERNALLY TO THE AFFECTED AREA UP TO FOUR TIMES DAILY AS NEEDED 12/31/17   Mcarthur Rossetti, MD  docusate sodium (COLACE) 100 MG capsule Take 100 mg by mouth 2 (two) times daily. As needed    [provider]  LORazepam (ATIVAN) 1 MG tablet Take 0.5-1 tablets (0.5-1 mg total) by mouth 2 (two) times daily as needed for anxiety. 01/23/21   Ria Bush, MD  NUCYNTA ER 200 MG TB12 Take 1 tablet by mouth 2 (two) times daily. 04/05/20    [provider]  omeprazole (PRILOSEC) 40 MG capsule Take 40 mg by mouth 2 (two) times daily. 10/18/20   [provider]  pediatric multivitamin-iron (POLY-VI-SOL WITH IRON) 15 MG chewable tablet Chew 1 tablet by mouth daily.    [provider]  promethazine (PHENERGAN) 25 MG tablet Take 1 tablet (25 mg total) by mouth every 8 (eight) hours as needed for nausea or vomiting. 04/24/19   Ria Bush, MD  sucralfate (CARAFATE) 1 g tablet Take 1 g by mouth 4 (four) times daily. 11/18/20   [provider]  thyroid (ARMOUR) 90 MG tablet Take 1 tablet (90 mg total) by mouth daily. One day a week take 1/2 tablet 12/02/20   Ria Bush, MD    Physical Exam: Vitals:   02/19/21 2101 02/19/21 2156 02/19/21 2200 02/19/21 2300  BP:  (!) 162/150 109/66 122/68  Pulse:  (!) 115 (!) 117 (!) 118  Resp:  14 (!) 26 14  Temp:  99.7 F (37.6 C)    TempSrc:  Oral    SpO2:  96% 96% 98%  Weight: 77.1 kg     Height: 5\' 8"  (1.727 m)      Physical Exam Vitals and nursing note reviewed.  Constitutional:      General: She is not in acute distress.    Appearance: Normal appearance.  HENT:     Head: Normocephalic and atraumatic.  Cardiovascular:     Rate and Rhythm: Regular rhythm. Tachycardia present.     Pulses: Normal pulses.     Heart sounds: Normal heart sounds. No murmur heard. Pulmonary:     Effort: Pulmonary effort is normal. Tachypnea present.     Breath sounds: Normal breath sounds. No wheezing or rhonchi.  Abdominal:     General: Bowel sounds are normal.     Palpations: Abdomen is soft.     Tenderness: There is no abdominal tenderness.  Musculoskeletal:        General: No swelling or tenderness. Normal range of motion.     Cervical back: Normal range of motion and neck supple.  Skin:    General: Skin is warm and dry.  Neurological:     General: No focal deficit present.     Mental Status: She is alert. Mental status is at baseline.  Psychiatric:         Mood and Affect: Mood normal.        Behavior: Behavior normal.      Assessment and Plan: * Sepsis ([place])- (present on admission) Source uncertain, suspecting acute gastroenteritis Patient received broad-spectrum antibiotics with vancomycin cefepime and Flagyl in the ED Continue sepsis fluids Treat acute gastroenteritis as outlined below Continue to trend lactate Follow urinalysis to evaluate for UTI  Acute gastroenteritis Stool studies Viral versus bacterial Cipro and Flagyl for now Clear liquid diet IV hydration, IV antiemetics, IV Protonix  Depression with anxiety Continue home lorazepam  Trigeminal neuralgia- (present on admission) Follows with neurology  Irritable bowel syndrome- (present on admission) Protonix Hold home Colace  Fibromyalgia- (present on admission) Continue baclofen and tapentadol/Nucynta  Hypothyroidism- (present on admission) Continue levothyroxine       Advance Care Planning:   Code Status: Prior full  Consults: none  Family Communication: husband at bedside  Severity of Illness: The appropriate patient status for this patient is INPATIENT. Inpatient status is judged to be reasonable and necessary in order to provide the required intensity of service to ensure the patient's safety. The patient's presenting symptoms, physical exam findings, and initial radiographic and laboratory data in the context of their chronic comorbidities is felt to place them at high risk for further  clinical deterioration. Furthermore, it is not anticipated that the patient will be medically stable for discharge from the hospital within 2 midnights of admission.   * I certify that at the point of admission it is my clinical judgment that the patient will require inpatient hospital care spanning beyond 2 midnights from the point of admission due to high intensity of service, high risk for further deterioration and high frequency of surveillance  required.*  Author: Athena Masse, MD 02/19/2021 11:40 PM  For on call review [email or web address].

## 2021-02-19 NOTE — Assessment & Plan Note (Signed)
Continue levothyroxine 

## 2021-02-19 NOTE — Assessment & Plan Note (Addendum)
GI panel was positive for norovirus.  C. difficile-negative We will discontinue antibiotics Clear liquid diet, advance as tolerated IV fluids, IV Protonix and IV antiemetics CT shows chronic stable liver hemangioma but no other acute abnormalities 6 Chest x-ray-negative

## 2021-02-19 NOTE — Assessment & Plan Note (Signed)
Follows with neurology 

## 2021-02-19 NOTE — ED Notes (Signed)
Critical lactic acid 3.2 per lab, [place] notified.

## 2021-02-19 NOTE — ED Notes (Signed)
Pt taken to CT at this time.

## 2021-02-19 NOTE — Assessment & Plan Note (Signed)
Protonix Hold home Colace

## 2021-02-19 NOTE — Assessment & Plan Note (Addendum)
Suspect secondary to acute gastroenteritis from norovirus.  Blood pressure is slightly soft but encourage oral fluids.  Additionally we will give gentle hydration.  Discontinue antibiotics.  UA is negative.  Chest x-ray is unremarkable.  COVID/flu negative.

## 2021-02-19 NOTE — Assessment & Plan Note (Addendum)
- 

## 2021-02-19 NOTE — Progress Notes (Signed)
CODE SEPSIS - PHARMACY COMMUNICATION  **Broad Spectrum Antibiotics should be administered within 1 hour of Sepsis diagnosis**  Time Code Sepsis Called/Page Received: 2123  Antibiotics Ordered: vancomycin, metronidazole, cefepime  Time of 1st antibiotic administration: 2145    Sherilyn Banker ,PharmD Clinical Pharmacist  02/19/2021  9:24 PM

## 2021-02-19 NOTE — ED Provider Notes (Signed)
[facility] Provider Note    Event Date/Time   First MD Initiated Contact with Patient 02/19/21 2105     (approximate)   History   Tachycardia   HPI  Karen Aguilar is a 56 y.o. female with a history of degenerative disc disease, chronic pain, IBS, migraines, and hypothyroidism who presents with persistent elevated heart rate over the last several days associated with initially diarrhea, and today with several episodes of bilious vomiting.  The patient reports diffuse abdominal discomfort that she describes as gas pain, but denies any focal abdominal pain.  She does report worsening of her chronic back and leg pain since she was unable to take her medication today.  She also reports shortness of breath.    Physical Exam   Triage Vital Signs: ED Triage Vitals  Enc Vitals Group     BP 02/19/21 2100 (!) 87/47     Pulse Rate 02/19/21 2100 (!) 160     Resp 02/19/21 2100 (!) 30     Temp 02/19/21 2100 100 F (37.8 C)     Temp Source 02/19/21 2100 Oral     SpO2 02/19/21 2100 100 %     Weight 02/19/21 2101 170 lb (77.1 kg)     Height 02/19/21 2101 5\' 8"  (1.727 m)     Head Circumference --      Peak Flow --      Pain Score --      Pain Loc --      Pain Edu? --      Excl. in [place]? --     Most recent vital signs: Vitals:   02/19/21 2200 02/19/21 2300  BP: 109/66 122/68  Pulse: (!) 117 (!) 118  Resp: (!) 26 14  Temp:    SpO2: 96% 98%     General: Alert, oriented x4, somewhat weak appearing but in no acute distress. CV:  Tachycardic, good peripheral perfusion.  Normal heart sounds. Resp:  Increased effort.  Lungs clear to auscultation bilaterally. Abd:  No distention.  Soft with mild discomfort but no focal tenderness. Other:  Mucous membranes somewhat dry.  No edema.  Motor intact in all extremities.   ED Results / Procedures / Treatments   Labs (all labs ordered are listed, but only abnormal results are displayed) Labs Reviewed   COMPREHENSIVE METABOLIC PANEL - Abnormal; Notable for the following components:      Result Value   Glucose, Bld 161 (*)    Creatinine, Ser 1.05 (*)    Total Protein 8.4 (*)    Anion gap 16 (*)    All other components within normal limits  LACTIC ACID, PLASMA - Abnormal; Notable for the following components:   Lactic Acid, Venous 3.2 (*)    All other components within normal limits  CBC WITH DIFFERENTIAL/PLATELET - Abnormal; Notable for the following components:   WBC 14.9 (*)    RBC 5.82 (*)    Hemoglobin 16.3 (*)    HCT 49.0 (*)    Neutro Abs 14.1 (*)    Lymphs Abs 0.4 (*)    Abs Immature Granulocytes 0.08 (*)    All other components within normal limits  RESP PANEL BY RT-PCR (FLU A&B, COVID) ARPGX2  CULTURE, BLOOD (ROUTINE X 2)  CULTURE, BLOOD (ROUTINE X 2)  PROTIME-INR  LIPASE, BLOOD  LACTIC ACID, PLASMA  URINALYSIS, ROUTINE W REFLEX MICROSCOPIC  URINALYSIS, COMPLETE (UACMP) WITH MICROSCOPIC  TROPONIN I (HIGH SENSITIVITY)  TROPONIN I (HIGH SENSITIVITY)  EKG  ED ECG REPORT I, Arta Silence, the attending physician, personally viewed and interpreted this ECG.  Date: 02/19/2021 EKG Time: 2059 Rate: 151 Rhythm: Sinus tachycardia QRS Axis: normal Intervals: normal ST/T Wave abnormalities: Nonspecific ST abnormalities Narrative Interpretation: Sinus tachycardia nonspecific ST abnormalities with no evidence of acute ischemia    RADIOLOGY  Chest x-ray: I independently viewed and interpreted the images; there is no focal consolidation or edema  CT abdomen/pelvis: I independently viewed and interpreted the images; there are no dilated bowel loops, significant wall thickening, or other acute abnormality  PROCEDURES:  Critical Care performed: Yes, see critical care procedure note(s)  .Critical Care Performed by: Arta Silence, MD Authorized by: Arta Silence, MD   Critical care provider statement:    Critical care time (minutes):  30    Critical care was necessary to treat or prevent imminent or life-threatening deterioration of the following conditions:  Sepsis   Critical care was time spent personally by me on the following activities:  Development of treatment plan with patient or surrogate, discussions with consultants, evaluation of patient's response to treatment, examination of patient, ordering and review of laboratory studies, ordering and review of radiographic studies, ordering and performing treatments and interventions, pulse oximetry, re-evaluation of patient's condition and review of old charts   Care discussed with: admitting provider     MEDICATIONS ORDERED IN ED: Medications  lactated ringers bolus 1,000 mL (0 mLs Intravenous Stopped 02/19/21 2324)    And  lactated ringers bolus 1,000 mL (1,000 mLs Intravenous New Bag/Given 02/19/21 2327)    And  lactated ringers bolus 500 mL (has no administration in time range)  vancomycin (VANCOREADY) IVPB 1750 mg/350 mL (1,750 mg Intravenous New Bag/Given 02/19/21 2328)  ceFEPIme (MAXIPIME) 2 g in sodium chloride 0.9 % 100 mL IVPB (0 g Intravenous Stopped 02/19/21 2214)  metroNIDAZOLE (FLAGYL) IVPB 500 mg (500 mg Intravenous New Bag/Given 02/19/21 2204)  ondansetron (ZOFRAN) injection 4 mg (4 mg Intravenous Given 02/19/21 2138)  LORazepam (ATIVAN) injection 1 mg (1 mg Intravenous Given 02/19/21 2135)  iohexol (OMNIPAQUE) 300 MG/ML solution 100 mL (100 mLs Intravenous Contrast Given 02/19/21 2242)     IMPRESSION / MDM / [place] / ED COURSE  I reviewed the triage vital signs and the nursing notes.  56 year old female with PMH as noted above presents with  Overall presentation is concerning for acute infection/sepsis.  Differential diagnosis includes, but is not limited to, gastroenteritis, colitis, diverticulitis, hepatobiliary etiology, pneumonia, COVID-19, other viral syndrome, UTI/pyelonephritis.  I have a lower suspicion for primary cardiac etiology.  We will  obtain lab work-up per sepsis protocol, give fluids, empiric broad-spectrum antibiotics, and obtain a chest x-ray and CT abdomen/pelvis.  The patient is on the cardiac monitor to evaluate for evidence of arrhythmia and/or significant heart rate changes.  ----------------------------------------- 11:29 PM on 02/19/2021 -----------------------------------------  Lab work-up confirms finding of sepsis with elevated lactate and leukocytosis.  Respiratory panel is negative and the CMP is unremarkable.  Chest x-ray shows no acute findings.  CT abdomen/pelvis also shows no acute findings.  The patient has known liver hemangiomas.  Overall presentation is still consistent with sepsis.  The urinalysis is still pending.  Broad-spectrum antibiotics have been administered.  The patient is receiving her fluid bolus and her vital signs are improved.  She is feeling significantly better.  She will still need admission.  I consulted Dr. Damita Dunnings from the hospitalist service; based on her discussion she agrees to admit the patient.  FINAL  CLINICAL IMPRESSION(S) / ED DIAGNOSES   Final diagnoses:  Sepsis, due to unspecified organism, unspecified whether acute organ dysfunction present [facility])     Rx / DC Orders   ED Discharge Orders     None        Note:  This document was prepared using Dragon voice recognition software and may include unintentional dictation errors.    Arta Silence, MD 02/19/21 2330

## 2021-02-19 NOTE — ED Triage Notes (Signed)
Pt states she feels like her heart rate is high, pt with tachypnea noted. Pt states has been vomiting and having diarrhea. Pt states "I need my pain medication." Pt appears in distress.

## 2021-02-19 NOTE — ED Notes (Signed)
Pt has had N/V soft stools with mucous x 1 day.  Denies symptoms at this time.

## 2021-02-19 NOTE — Progress Notes (Addendum)
PHARMACY -  BRIEF ANTIBIOTIC NOTE   Pharmacy has received consult(s) for vancomycin and cefepime from an ED provider.  The patient's profile has been reviewed for ht/wt/allergies/indication/available labs. Pt has documented allergy to Augmentin (lip swelling and irritation), however pt has received other cephalosporins in the past.   One time order(s) placed for: Cefepime 2 g IV Vancomycin 1750 mg IV   Further antibiotics/pharmacy consults should be ordered by admitting physician if indicated.                       Thank you, Forde Dandy Letonia Stead 02/19/2021  9:25 PM

## 2021-02-19 NOTE — ED Notes (Signed)
This rn accidentally typed information under lisa may screen, this rn obtained triage information.

## 2021-02-19 NOTE — Progress Notes (Signed)
Pt being followed by [facility] for Sepsis protocol. 

## 2021-02-19 NOTE — Assessment & Plan Note (Signed)
Continue baclofen and tapentadol/Nucynta

## 2021-02-20 DIAGNOSIS — A419 Sepsis, unspecified organism: Principal | ICD-10-CM

## 2021-02-20 DIAGNOSIS — R6520 Severe sepsis without septic shock: Secondary | ICD-10-CM

## 2021-02-20 LAB — C DIFFICILE QUICK SCREEN W PCR REFLEX
C Diff antigen: NEGATIVE
C Diff interpretation: NOT DETECTED
C Diff toxin: NEGATIVE

## 2021-02-20 LAB — CBC
HCT: 30.9 % — ABNORMAL LOW (ref 36.0–46.0)
HCT: 31.7 % — ABNORMAL LOW (ref 36.0–46.0)
Hemoglobin: 10.2 g/dL — ABNORMAL LOW (ref 12.0–15.0)
Hemoglobin: 10.3 g/dL — ABNORMAL LOW (ref 12.0–15.0)
MCH: 28.4 pg (ref 26.0–34.0)
MCH: 28.1 pg (ref 26.0–34.0)
MCHC: 33 g/dL (ref 30.0–36.0)
MCHC: 32.5 g/dL (ref 30.0–36.0)
MCV: 86.1 fL (ref 80.0–100.0)
MCV: 86.4 fL (ref 80.0–100.0)
Platelets: 213 10*3/uL (ref 150–400)
Platelets: 208 10*3/uL (ref 150–400)
RBC: 3.59 MIL/uL — ABNORMAL LOW (ref 3.87–5.11)
RBC: 3.67 MIL/uL — ABNORMAL LOW (ref 3.87–5.11)
RDW: 12.2 % (ref 11.5–15.5)
RDW: 12.4 % (ref 11.5–15.5)
WBC: 9.5 10*3/uL (ref 4.0–10.5)
WBC: 8.7 10*3/uL (ref 4.0–10.5)
nRBC: 0 % (ref 0.0–0.2)
nRBC: 0 % (ref 0.0–0.2)

## 2021-02-20 LAB — LACTIC ACID, PLASMA
Lactic Acid, Venous: 2.6 mmol/L (ref 0.5–1.9)
Lactic Acid, Venous: 2 mmol/L (ref 0.5–1.9)
Lactic Acid, Venous: 1.3 mmol/L (ref 0.5–1.9)

## 2021-02-20 LAB — URINALYSIS, COMPLETE (UACMP) WITH MICROSCOPIC
Bacteria, UA: NONE SEEN
Bilirubin Urine: NEGATIVE
Glucose, UA: NEGATIVE mg/dL
Hgb urine dipstick: NEGATIVE
Ketones, ur: 20 mg/dL — AB
Leukocytes,Ua: NEGATIVE
Nitrite: NEGATIVE
Protein, ur: NEGATIVE mg/dL
Specific Gravity, Urine: >1.046 — ABNORMAL HIGH (ref 1.005–1.030)
WBC, UA: NONE SEEN WBC/hpf (ref 0–5)
pH: 5 (ref 5.0–8.0)

## 2021-02-20 LAB — GASTROINTESTINAL PANEL BY PCR, STOOL (REPLACES STOOL CULTURE)

## 2021-02-20 LAB — HIV ANTIBODY (ROUTINE TESTING W REFLEX): HIV Screen 4th Generation wRfx: NONREACTIVE

## 2021-02-20 LAB — PROTIME-INR
INR: 1.1 (ref 0.8–1.2)
Prothrombin Time: 14.6 seconds (ref 11.4–15.2)

## 2021-02-20 LAB — APTT: aPTT: 33 seconds (ref 24–36)

## 2021-02-20 LAB — BASIC METABOLIC PANEL
Anion gap: 6 (ref 5–15)
BUN: 16 mg/dL (ref 6–20)
CO2: 27 mmol/L (ref 22–32)
Calcium: 8 mg/dL — ABNORMAL LOW (ref 8.9–10.3)
Chloride: 102 mmol/L (ref 98–111)
Creatinine, Ser: 0.8 mg/dL (ref 0.44–1.00)
GFR, Estimated: >60 mL/min (ref >60)
Glucose, Bld: 113 mg/dL — ABNORMAL HIGH (ref 70–99)
Potassium: 3.3 mmol/L — ABNORMAL LOW (ref 3.5–5.1)
Sodium: 135 mmol/L (ref 135–145)

## 2021-02-20 LAB — TROPONIN I (HIGH SENSITIVITY): Troponin I (High Sensitivity): 7 ng/L (ref <18)

## 2021-02-20 LAB — TSH: TSH: 0.416 u[IU]/mL (ref 0.350–4.500)

## 2021-02-20 LAB — CORTISOL: Cortisol, Plasma: 6 ug/dL

## 2021-02-20 LAB — PROCALCITONIN: Procalcitonin: 0.23 ng/mL

## 2021-02-20 MED ORDER — MIDODRINE HCL 5 MG PO TABS
5.0000 mg | ORAL_TABLET | Freq: Once | ORAL | Status: AC
  Administered 2021-02-20: 5 mg via ORAL
  Filled 2021-02-20: qty 1

## 2021-02-20 MED ORDER — GUAIFENESIN 100 MG/5ML PO LIQD
5.0000 mL | ORAL | Status: DC | PRN
Start: 2021-02-20 — End: 2021-02-21

## 2021-02-20 MED ORDER — METOPROLOL TARTRATE 5 MG/5ML IV SOLN
5.0000 mg | INTRAVENOUS | Status: DC | PRN
Start: 2021-02-20 — End: 2021-02-21

## 2021-02-20 MED ORDER — MIDODRINE HCL 5 MG PO TABS
10.0000 mg | ORAL_TABLET | Freq: Three times a day (TID) | ORAL | Status: DC
  Administered 2021-02-20 – 2021-02-21 (×3): 10 mg via ORAL
  Filled 2021-02-20 (×3): qty 2

## 2021-02-20 MED ORDER — LACTATED RINGERS IV BOLUS
500.0000 mL | Freq: Once | INTRAVENOUS | Status: AC
  Administered 2021-02-20: 500 mL via INTRAVENOUS

## 2021-02-20 MED ORDER — OXYCODONE HCL 5 MG PO TABS
5.0000 mg | ORAL_TABLET | ORAL | Status: DC | PRN
  Administered 2021-02-20: 5 mg via ORAL
  Filled 2021-02-20: qty 1

## 2021-02-20 MED ORDER — POTASSIUM CHLORIDE 10 MEQ/100ML IV SOLN
10.0000 meq | INTRAVENOUS | Status: AC
  Administered 2021-02-20 (×4): 10 meq via INTRAVENOUS
  Filled 2021-02-20: qty 100

## 2021-02-20 MED ORDER — SENNOSIDES-DOCUSATE SODIUM 8.6-50 MG PO TABS
1.0000 | ORAL_TABLET | Freq: Every evening | ORAL | Status: DC | PRN
Start: 2021-02-20 — End: 2021-02-21

## 2021-02-20 MED ORDER — FAMOTIDINE IN NACL 20-0.9 MG/50ML-% IV SOLN
20.0000 mg | Freq: Once | INTRAVENOUS | Status: AC
  Administered 2021-02-20: 20 mg via INTRAVENOUS
  Filled 2021-02-20: qty 50

## 2021-02-20 MED ORDER — PANTOPRAZOLE SODIUM 40 MG PO TBEC
40.0000 mg | DELAYED_RELEASE_TABLET | Freq: Every day | ORAL | Status: DC
  Administered 2021-02-21: 40 mg via ORAL
  Filled 2021-02-20: qty 1

## 2021-02-20 MED ORDER — MIDODRINE HCL 5 MG PO TABS: 5.0000 mg | ORAL_TABLET | Freq: Three times a day (TID) | ORAL | Status: DC

## 2021-02-20 MED ORDER — BACLOFEN 10 MG PO TABS
10.0000 mg | ORAL_TABLET | Freq: Three times a day (TID) | ORAL | Status: DC
  Administered 2021-02-20 – 2021-02-21 (×2): 10 mg via ORAL
  Filled 2021-02-20 (×2): qty 1

## 2021-02-20 MED ORDER — TAPENTADOL HCL ER 50 MG PO TB12
200.0000 mg | ORAL_TABLET | Freq: Two times a day (BID) | ORAL | Status: DC
  Administered 2021-02-20 – 2021-02-21 (×3): 200 mg via ORAL
  Filled 2021-02-20 (×2): qty 2
  Filled 2021-02-20 (×3): qty 4

## 2021-02-20 MED ORDER — SODIUM CHLORIDE 0.9 % IV BOLUS
1000.0000 mL | Freq: Once | INTRAVENOUS | Status: AC
  Administered 2021-02-20: 1000 mL via INTRAVENOUS

## 2021-02-20 MED ORDER — ALUM & MAG HYDROXIDE-SIMETH 200-200-20 MG/5ML PO SUSP
30.0000 mL | ORAL | Status: DC | PRN
  Filled 2021-02-20: qty 30

## 2021-02-20 MED ORDER — CALCIUM CARBONATE ANTACID 500 MG PO CHEW
1500.0000 mg | CHEWABLE_TABLET | Freq: Two times a day (BID) | ORAL | Status: DC
  Administered 2021-02-20 – 2021-02-21 (×3): 1500 mg via ORAL
  Filled 2021-02-20 (×3): qty 8

## 2021-02-20 MED ORDER — LORATADINE 10 MG PO TABS: 10.0000 mg | ORAL_TABLET | Freq: Every day | ORAL | Status: DC | PRN

## 2021-02-20 MED ORDER — DIPHENHYDRAMINE HCL 50 MG/ML IJ SOLN
50.0000 mg | Freq: Once | INTRAMUSCULAR | Status: AC
  Administered 2021-02-20: 50 mg via INTRAVENOUS
  Filled 2021-02-20: qty 1

## 2021-02-20 MED ORDER — IPRATROPIUM-ALBUTEROL 0.5-2.5 (3) MG/3ML IN SOLN: 3.0000 mL | RESPIRATORY_TRACT | Status: DC | PRN

## 2021-02-20 MED ORDER — PHENOL 1.4 % MT LIQD
1.0000 | OROMUCOSAL | Status: DC | PRN
  Filled 2021-02-20: qty 177

## 2021-02-20 MED ORDER — HYDROCORTISONE 1 % EX CREA
1.0000 | TOPICAL_CREAM | Freq: Three times a day (TID) | CUTANEOUS | Status: DC | PRN
Start: 2021-02-20 — End: 2021-02-21
  Filled 2021-02-20: qty 28

## 2021-02-20 MED ORDER — THYROID 30 MG PO TABS: 45.0000 mg | ORAL_TABLET | ORAL | Status: DC

## 2021-02-20 MED ORDER — SALINE SPRAY 0.65 % NA SOLN
1.0000 | NASAL | Status: DC | PRN
Start: 2021-02-20 — End: 2021-02-21
  Filled 2021-02-20: qty 44

## 2021-02-20 MED ORDER — THYROID 60 MG PO TABS
90.0000 mg | ORAL_TABLET | ORAL | Status: DC
  Administered 2021-02-20 – 2021-02-21 (×2): 90 mg via ORAL
  Filled 2021-02-20 (×2): qty 1

## 2021-02-20 MED ORDER — POLYVINYL ALCOHOL 1.4 % OP SOLN
1.0000 [drp] | OPHTHALMIC | Status: DC | PRN
  Filled 2021-02-20: qty 15

## 2021-02-20 MED ORDER — OXYCODONE HCL 5 MG PO TABS: 5.0000 mg | ORAL_TABLET | ORAL | Status: DC | PRN

## 2021-02-20 MED ORDER — CIPROFLOXACIN IN D5W 400 MG/200ML IV SOLN
400.0000 mg | Freq: Two times a day (BID) | INTRAVENOUS | Status: DC
Start: 2021-02-20 — End: 2021-02-21
  Administered 2021-02-20 – 2021-02-21 (×3): 400 mg via INTRAVENOUS
  Filled 2021-02-20 (×4): qty 200

## 2021-02-20 MED ORDER — TRAZODONE HCL 50 MG PO TABS: 50.0000 mg | ORAL_TABLET | Freq: Every evening | ORAL | Status: DC | PRN

## 2021-02-20 MED ORDER — BACLOFEN 10 MG PO TABS
10.0000 mg | ORAL_TABLET | Freq: Three times a day (TID) | ORAL | Status: DC | PRN
  Administered 2021-02-20: 10 mg via ORAL
  Filled 2021-02-20 (×3): qty 1

## 2021-02-20 MED ORDER — MUSCLE RUB 10-15 % EX CREA
1.0000 "application " | TOPICAL_CREAM | CUTANEOUS | Status: DC | PRN
  Filled 2021-02-20: qty 85

## 2021-02-20 MED ORDER — HYDROCORTISONE (PERIANAL) 2.5 % EX CREA
1.0000 "application " | TOPICAL_CREAM | Freq: Four times a day (QID) | CUTANEOUS | Status: DC | PRN
  Filled 2021-02-20: qty 28.35

## 2021-02-20 MED ORDER — HYDRALAZINE HCL 20 MG/ML IJ SOLN: 10.0000 mg | INTRAMUSCULAR | Status: DC | PRN

## 2021-02-20 MED ORDER — DOCUSATE SODIUM 100 MG PO CAPS: 100.0000 mg | ORAL_CAPSULE | Freq: Two times a day (BID) | ORAL | Status: DC

## 2021-02-20 NOTE — ED Notes (Signed)
Notified NP Foust that pt BP is 85/55 (62.)

## 2021-02-20 NOTE — Progress Notes (Signed)
PROGRESS NOTE    Karen [place]  QVZ:[id number] DOB: October 31, 1965 DOA: 02/19/2021 PCP: Ria Bush, MD   Brief Narrative:   56 year old with history of IBS, esophageal dysfunction followed at [facility], anxiety, depression, thyroid disease, fibromyalgia, chronic pain, trigeminal neuropathy comes to the hospital with ongoing diarrhea and vomiting for the past 3 days.  Upon admission CT of the abdomen pelvis was negative but she met sepsis criteria likely from acute gastroenteritis.  She was initially started on vancomycin cefepime and Flagyl but she developed "red man" syndrome from vancomycin.  Antibiotics were changed to Cipro and Flagyl.  She was also hypotensive requiring fluid resuscitation and midodrine.  Assessment & Plan:  Principal Problem:   Severe sepsis ([place]) Active Problems:   Acute gastroenteritis   Hypothyroidism   Fibromyalgia   Chronic pain syndrome   Irritable bowel syndrome   Trigeminal neuralgia   Depression with anxiety     Assessment and Plan: * Severe sepsis ([place]) Suspect secondary to acute gastroenteritis.  Currently on broad-spectrum antibiotics. We will continue aggressive fluid resuscitation due to still borderline low blood pressure.  Monitor urine output. Lactic acidosis on IVF Dose of midodrine given On Abx= Cipro and Flagyl Flu/COVID = neg UA- Chest x-ray- unremarkable Procalcitonin- 0.23    Acute gastroenteritis Appears to have infectious gastroenteritis.  Viral versus bacterial GI panel and C diff ordered Empiric antibiotics- Cipro Flagyl Clear liquid diet IV fluids, IV Protonix and IV antiemetics CT shows chronic stable liver hemangioma but no other acute abnormalities 6 Chest x-ray-negative  Depression with anxiety Continue home lorazepam  Trigeminal neuralgia- (present on admission) Follows with neurology  Irritable bowel syndrome- (present on admission) Protonix Hold home Colace  Chronic pain syndrome- (present on  admission) Continue home medications.  Follows with outpatient [place] [facility].  Fibromyalgia- (present on admission) Continue baclofen and tapentadol/Nucynta  Hypothyroidism- (present on admission) Continue levothyroxine          DVT prophylaxis: enoxaparin (LOVENOX) injection 40 mg Start: 02/20/21 0800 Code Status: Full code Family Communication:    Status is: Inpatient Remains inpatient appropriate because: Patient is still hypotensive with poor oral intake.  Requires aggressive IV fluids, antibiotics.  Continue clear liquid diet.  Slowly advance as tolerated.  Likely will stay in the hospital for at least next 24-48 hours.          Subjective: This morning patient remained hypotensive despite of IV fluids.  Another bolus and midodrine was ordered.  When I saw the patient she was quite weak.  Denied any diarrhea lower abdominal pain type symptoms but overall felt very weak.  She denies any sick contacts or eating anything unusual.  Review of Systems Otherwise negative except as per HPI, including: General: Denies fever, chills, night sweats or unintended weight loss. Resp: Denies cough, wheezing, shortness of breath. Cardiac: Denies chest pain, palpitations, orthopnea, paroxysmal nocturnal dyspnea. GI: Denies abdominal pain, nausea, vomiting, constipation GU: Denies dysuria, frequency, hesitancy or incontinence MS: Denies muscle aches, joint pain or swelling Neuro: Denies headache, neurologic deficits (focal weakness, numbness, tingling), abnormal gait Psych: Denies anxiety, depression, SI/HI/AVH Skin: Denies new rashes or lesions ID: Denies sick contacts, exotic exposures, travel  Examination:  General exam: Appears calm and comfortable, dry mouth Respiratory system: Clear to auscultation. Respiratory effort normal. Cardiovascular system: S1 & S2 heard, RRR. No JVD, murmurs, rubs, gallops or clicks. No pedal edema. Gastrointestinal system: Abdomen is  nondistended, soft and nontender. No organomegaly or masses felt. Normal bowel sounds heard. Central nervous system:  Alert and oriented. No focal neurological deficits. Extremities: Symmetric 5 x 5 power. Skin: No rashes, lesions or ulcers Psychiatry: Judgement and insight appear normal. Mood & affect appropriate.     Objective: Vitals:   02/20/21 0630 02/20/21 0700 02/20/21 0715 02/20/21 0730  BP: (!) 86/56 (!) 94/59 (!) 95/57 (!) 81/52  Pulse: 78 79 78 73  Resp: $Remo'12 15 14 18  'dUiFs$ Temp:      TempSrc:      SpO2: 98% 97% 99% 98%  Weight:      Height:        Intake/Output Summary (Last 24 hours) at 02/20/2021 1002 Last data filed at 02/20/2021 8786 Gross per 24 hour  Intake 6897.61 ml  Output --  Net 6897.61 ml   Filed Weights   02/19/21 2101  Weight: 77.1 kg     Data Reviewed:   CBC: Recent Labs  Lab 02/19/21 2118 02/20/21 0430 02/20/21 0722  WBC 14.9* 9.5 8.7  NEUTROABS 14.1*  --   --   HGB 16.3* 10.2* 10.3*  HCT 49.0* 30.9* 31.7*  MCV 84.2 86.1 86.4  PLT 395 213 767   Basic Metabolic Panel: Recent Labs  Lab 02/19/21 2118 02/20/21 0430  NA 139 135  K 3.6 3.3*  CL 98 102  CO2 25 27  GLUCOSE 161* 113*  BUN 20 16  CREATININE 1.05* 0.80  CALCIUM 9.7 8.0*   GFR: Estimated Creatinine Clearance: 87.8 mL/min (by C-G formula based on SCr of 0.8 mg/dL). Liver Function Tests: Recent Labs  Lab 02/19/21 2118  AST 30  ALT 18  ALKPHOS 88  BILITOT 0.8  PROT 8.4*  ALBUMIN 4.3   Recent Labs  Lab 02/19/21 2130  LIPASE 28   No results for input(s): AMMONIA in the last 168 hours. Coagulation Profile: Recent Labs  Lab 02/19/21 2118 02/20/21 0722  INR 0.9 1.1   Cardiac Enzymes: No results for input(s): CKTOTAL, CKMB, CKMBINDEX, TROPONINI in the last 168 hours. BNP (last 3 results) No results for input(s): PROBNP in the last 8760 hours. HbA1C: No results for input(s): HGBA1C in the last 72 hours. CBG: No results for input(s): GLUCAP in the last 168  hours. Lipid Profile: No results for input(s): CHOL, HDL, LDLCALC, TRIG, CHOLHDL, LDLDIRECT in the last 72 hours. Thyroid Function Tests: Recent Labs    02/19/21 2352  TSH 0.416   Anemia Panel: No results for input(s): VITAMINB12, FOLATE, FERRITIN, TIBC, IRON, RETICCTPCT in the last 72 hours. Sepsis Labs: Recent Labs  Lab 02/19/21 2119 02/19/21 2305 02/20/21 0430 02/20/21 0920  PROCALCITON  --   --  0.23  --   LATICACIDVEN 3.2* 2.6*  --  2.0*    Recent Results (from the past 240 hour(s))  Culture, blood (Routine x 2)     Status: None (Preliminary result)   Collection Time: 02/19/21  9:18 PM   Specimen: BLOOD  Result Value Ref Range Status   Specimen Description BLOOD LEFT ANTECUBITAL  Final   Special Requests   Final    BOTTLES DRAWN AEROBIC AND ANAEROBIC Blood Culture adequate volume   Culture   Final    NO GROWTH < 12 HOURS Performed at [facility], [address] [place], [place] [address]    Report Status PENDING  Incomplete  Culture, blood (Routine x 2)     Status: None (Preliminary result)   Collection Time: 02/19/21  9:18 PM   Specimen: BLOOD  Result Value Ref Range Status   Specimen Description BLOOD RIGHT ANTECUBITAL  Final   Special Requests   Final    BOTTLES DRAWN AEROBIC AND ANAEROBIC Blood Culture adequate volume   Culture   Final    NO GROWTH < 12 HOURS Performed at [facility], [address] [place], [place] [address]    Report Status PENDING  Incomplete  Resp Panel by RT-PCR (Flu A&B, Covid) Nasopharyngeal Swab     Status: None   Collection Time: 02/19/21 10:10 PM   Specimen: Nasopharyngeal Swab; Nasopharyngeal(NP) swabs in vial transport medium  Result Value Ref Range Status   SARS Coronavirus 2 by RT PCR NEGATIVE NEGATIVE Final    Comment: (NOTE) SARS-CoV-2 target nucleic acids are NOT DETECTED.  The SARS-CoV-2 RNA is generally detectable in upper respiratory specimens during the acute phase of infection. The  lowest concentration of SARS-CoV-2 viral copies this assay can detect is 138 copies/mL. A negative result does not preclude SARS-Cov-2 infection and should not be used as the sole basis for treatment or other patient management decisions. A negative result may occur with  improper specimen collection/handling, submission of specimen other than nasopharyngeal swab, presence of viral mutation(s) within the areas targeted by this assay, and inadequate number of viral copies(<138 copies/mL). A negative result must be combined with clinical observations, patient history, and epidemiological information. The expected result is Negative.  Fact Sheet for Patients:  EntrepreneurPulse.com.au  Fact Sheet for Healthcare Providers:  IncredibleEmployment.be  This test is no t yet approved or cleared by the [place] FDA and  has been authorized for detection and/or diagnosis of SARS-CoV-2 by FDA under an Emergency Use Authorization (EUA). This EUA will remain  in effect (meaning this test can be used) for the duration of the COVID-19 declaration under Section 564(b)(1) of the Act, 21 U.S.C.section 360bbb-3(b)(1), unless the authorization is terminated  or revoked sooner.       Influenza A by PCR NEGATIVE NEGATIVE Final   Influenza B by PCR NEGATIVE NEGATIVE Final    Comment: (NOTE) The Xpert Xpress SARS-CoV-2/FLU/RSV plus assay is intended as an aid in the diagnosis of influenza from Nasopharyngeal swab specimens and should not be used as a sole basis for treatment. Nasal washings and aspirates are unacceptable for Xpert Xpress SARS-CoV-2/FLU/RSV testing.  Fact Sheet for Patients: EntrepreneurPulse.com.au  Fact Sheet for Healthcare Providers: IncredibleEmployment.be  This test is not yet approved or cleared by the [place] FDA and has been authorized for detection and/or diagnosis of SARS-CoV-2 by FDA under  an Emergency Use Authorization (EUA). This EUA will remain in effect (meaning this test can be used) for the duration of the COVID-19 declaration under Section 564(b)(1) of the Act, 21 U.S.C. section 360bbb-3(b)(1), unless the authorization is terminated or revoked.  Performed at [facility], [address] [place],  [address]          Radiology Studies: DG Chest 1 View  Result Date: 02/19/2021 CLINICAL DATA:  Shortness of breath, tachycardia, fever. EXAM: CHEST  1 VIEW COMPARISON:  07/20/2020 FINDINGS: The cardiomediastinal contours are normal. The lungs are clear. Pulmonary vasculature is normal. No consolidation, pleural effusion, or pneumothorax. There is stimulator in place. Surgical hardware in the lower cervical spine is partially included. No acute osseous abnormalities are seen. IMPRESSION: No acute chest findings. Electronically Signed   By: Keith Rake M.D.   On: 02/19/2021 21:24   CT ABDOMEN PELVIS W CONTRAST  Result Date: 02/19/2021 CLINICAL DATA:  Abdominal pain, acute, nonlocalized. EXAM: CT ABDOMEN AND PELVIS WITH CONTRAST TECHNIQUE: Multidetector  CT imaging of the abdomen and pelvis was performed using the standard protocol following bolus administration of intravenous contrast. RADIATION DOSE REDUCTION: This exam was performed according to the departmental dose-optimization program which includes automated exposure control, adjustment of the mA and/or kV according to patient size and/or use of iterative reconstruction technique. CONTRAST:  131mL OMNIPAQUE IOHEXOL 300 MG/ML  SOLN COMPARISON:  04/25/2019.  04/03/2012.  02/12/2013. FINDINGS: Lower chest: Normal Hepatobiliary: 1.5 cm hemangioma at the dome of the liver as seen previously. 6 mm hemangioma within the lateral segment of the left lobe of the liver. 1.5 cm hemangioma at the caudal tip of the right lobe of the liver as seen previously. Previous cholecystectomy. Common duct is smaller than was  seen previously. This measured up to 16 mm on the study of April 2021 but measures only 10 mm today. Pancreas: Normal.  Duodenal diverticulum is present. Spleen: Normal Adrenals/Urinary Tract: Adrenal glands are normal. Kidneys are normal. No cyst, mass, stone or hydronephrosis. Bladder is normal. Stomach/Bowel: Stomach and small intestine are normal. Normal appendix. Normal appearance of the colon. Normal amount of fecal matter. Vascular/Lymphatic: Aortic atherosclerosis, minimal. No aneurysm. IVC is normal. No adenopathy. Reproductive: Normal.  No pelvic mass. Other: No free fluid or air. Musculoskeletal: Distant lumbar fusion surgeries L4 to the sacrum. No acute bone finding. IMPRESSION: No cause of the acute presentation is identified. No evidence of acute solid organ pathology or bowel pathology. Hemangiomas within the liver as seen previously, unchanged since 2014 normal range prominence of the extrahepatic biliary tree on today's examination, maximal diameter of the common bile duct 1 cm in this patient has had previous cholecystectomy. At the time of the examination in April 2021, the common duct measured up to 16 mm in diameter. No obstructing lesion was identified. Electronically Signed   By: Nelson Chimes M.D.   On: 02/19/2021 23:03        Scheduled Meds:  calcium carbonate  2-3 tablet Oral BID   docusate sodium  100 mg Oral BID   enoxaparin (LOVENOX) injection  40 mg Subcutaneous Q24H   midodrine  5 mg Oral TID WC   pantoprazole  40 mg Oral Daily   Tapentadol HCl  1 tablet Oral BID   thyroid  90 mg Oral Daily   Continuous Infusions:  ciprofloxacin Stopped (02/20/21 0226)   lactated ringers 150 mL/hr at 02/20/21 0711   metronidazole     potassium chloride 10 mEq (02/20/21 0935)     LOS: 1 day   Time spent= 35 mins    Michaelann Gunnoe Arsenio Loader, MD [facility]  If 7PM-7AM, please contact night-coverage  02/20/2021, 10:02 AM

## 2021-02-20 NOTE — ED Notes (Signed)
Pt ambulated to toilet and back to urinate, steady with 1 assist.

## 2021-02-20 NOTE — Progress Notes (Addendum)
° °      CROSS COVER NOTE  NAME: Karen Aguilar MRN: [id number] DOB : [birth year]   Secure chat received from [place]  Pt is almost done with her Vanc but her face, neck, upper chest are red from Red Man syndrome, I paused the infusion but just want to verify if it's okay to continue?  Can she have some benadryl? She says the back of her neck is itching.   Plan Vancomycin Infusion Reaction - Stop the infusion - Benadryl 50 mg IV - Pepcid 20 mg IV - Will give IV fluids as needed if patient becomes hypotensive - Consider premedication and infusion rate reduction with future doses of vancomycin   0500AM: Update: Patient hypotensive BP 80/42. Pt is asymptomatic and ambulating to toilet in room without dizziness or altered gait.  - LR 500 mL x 2 ordered - 5 mg PO midodrine ordered - Repeat CBC and Coags ordered - Discussed with PCCM for possible initiation of pressors - BP 94/59 MAP 70 at 0700, update sent via secure chat to rounding physician  Josephina Shih, MSN, FNP-BC Nurse Practitioner [facility] Pager [phone]

## 2021-02-20 NOTE — ED Notes (Signed)
Pt redness has resolved, states back of neck still itches but less than before.

## 2021-02-20 NOTE — Assessment & Plan Note (Signed)
Continue home medications.  Follows with outpatient [facility] [facility].

## 2021-02-20 NOTE — ED Notes (Signed)
CBC came back with decreased blood counts, redrew for lab to verify b/c they thought they might have been diluted but lab confirmed both draws matched up. Foust NP notified.

## 2021-02-20 NOTE — Progress Notes (Signed)
Pharmacy Antibiotic Note  Karen Aguilar is a 56 y.o. female admitted on 02/19/2021 with sepsis, intra-abdominal infection.  Pharmacy has been consulted for Ciprofloxacin dosing.  Plan: Cipro 400 mg IV Q12H ordered to start on 2/20 @ 0100.   Height: 5\' 8"  (172.7 cm) Weight: 77.1 kg (170 lb) IBW/kg (Calculated) : 63.9  Temp (24hrs), Avg:99.9 F (37.7 C), Min:99.7 F (37.6 C), Max:100 F (37.8 C)  Recent Labs  Lab 02/19/21 2118 02/19/21 2119  WBC 14.9*  --   CREATININE 1.05*  --   LATICACIDVEN  --  3.2*    Estimated Creatinine Clearance: 66.9 mL/min (A) (by C-G formula based on SCr of 1.05 mg/dL (H)).    Allergies  Allergen Reactions   Augmentin [Amoxicillin-Pot Clavulanate] Swelling    Lip swelling and irritation.    Fluconazole Swelling    Lips - swelling, bleeding, blisters, cracking, peeling   Erythromycin Nausea And Vomiting   Influenza Vaccines Hives and Swelling    Redness, heat, and extreme swelling at site   Levothyroxine Hives   Thimerosal Swelling    Local reaction-redness,swelling; veins popping   Tomato Other (See Comments)    Severe acid reflux with all acid foods   Adhesive [Tape] Rash    blisters   Azithromycin Rash   Gabapentin Other (See Comments)    Short term memory loss   Imitrex [Sumatriptan] Other (See Comments)    Rebound migraine    Lyrica [Pregabalin] Other (See Comments)    Short term memory loss   Macrodantin [Nitrofurantoin] Rash   Macrolides And Ketolides Rash   Nickel Swelling and Rash   Sulfa Antibiotics Rash   Sulfasalazine Rash   Troleandomycin Rash    Antimicrobials this admission:   >>    >>   Dose adjustments this admission:   Microbiology results:  BCx:   UCx:    Sputum:    MRSA PCR:   Thank you for allowing pharmacy to be a part of this patients care.  Lianah Peed D 02/20/2021 12:34 AM

## 2021-02-20 NOTE — ED Notes (Signed)
Lactic Acid sent to lab

## 2021-02-20 NOTE — Clinical Social Work Note (Signed)
°  Transition of Care [facility]) Screening Note   Patient Details  Name: Karen Aguilar Date of Birth: 1965/04/04   Transition of Care [facility]) CM/SW Contact:    Eileen Stanford, LCSW Phone [place] 02/20/2021, 3:41 PM    Transition of Care Department [facility]) has reviewed patient and no TOC needs have been identified at this time. We will continue to monitor patient advancement through interdisciplinary progression rounds. If new patient transition needs arise, please place a TOC consult.

## 2021-02-20 NOTE — ED Notes (Signed)
Pt resting in bed, alert at this time.

## 2021-02-20 NOTE — ED Notes (Signed)
Pt face, neck, upper chest are red from Red Man syndrome d/t Vanc infusion.  Denies pruritis but feels 'hot' in those areas.  Infusion paused.  NP Foust notified.

## 2021-02-21 ENCOUNTER — Telehealth: Payer: Self-pay | Admitting: Family Medicine

## 2021-02-21 LAB — CBC
HCT: 32.3 % — ABNORMAL LOW (ref 36.0–46.0)
Hemoglobin: 10.4 g/dL — ABNORMAL LOW (ref 12.0–15.0)
MCH: 28.1 pg (ref 26.0–34.0)
MCHC: 32.2 g/dL (ref 30.0–36.0)
MCV: 87.3 fL (ref 80.0–100.0)
Platelets: 202 10*3/uL (ref 150–400)
RBC: 3.7 MIL/uL — ABNORMAL LOW (ref 3.87–5.11)
RDW: 12.6 % (ref 11.5–15.5)
WBC: 5.2 10*3/uL (ref 4.0–10.5)
nRBC: 0 % (ref 0.0–0.2)

## 2021-02-21 LAB — COMPREHENSIVE METABOLIC PANEL
ALT: 14 U/L (ref 0–44)
AST: 16 U/L (ref 15–41)
Albumin: 2.8 g/dL — ABNORMAL LOW (ref 3.5–5.0)
Alkaline Phosphatase: 50 U/L (ref 38–126)
Anion gap: 6 (ref 5–15)
BUN: 6 mg/dL (ref 6–20)
CO2: 29 mmol/L (ref 22–32)
Calcium: 8.4 mg/dL — ABNORMAL LOW (ref 8.9–10.3)
Chloride: 105 mmol/L (ref 98–111)
Creatinine, Ser: 0.79 mg/dL (ref 0.44–1.00)
GFR, Estimated: >60 mL/min (ref >60)
Glucose, Bld: 95 mg/dL (ref 70–99)
Potassium: 3.6 mmol/L (ref 3.5–5.1)
Sodium: 140 mmol/L (ref 135–145)
Total Bilirubin: 0.4 mg/dL (ref 0.3–1.2)
Total Protein: 5 g/dL — ABNORMAL LOW (ref 6.5–8.1)

## 2021-02-21 LAB — MAGNESIUM: Magnesium: 1.6 mg/dL — ABNORMAL LOW (ref 1.7–2.4)

## 2021-02-21 MED ORDER — MAGNESIUM OXIDE -MG SUPPLEMENT 400 (240 MG) MG PO TABS
800.0000 mg | ORAL_TABLET | Freq: Once | ORAL | Status: AC
  Administered 2021-02-21: 800 mg via ORAL
  Filled 2021-02-21: qty 2

## 2021-02-21 MED ORDER — ONDANSETRON 4 MG PO TBDP
4.0000 mg | ORAL_TABLET | Freq: Three times a day (TID) | ORAL | 0 refills | Status: DC | PRN
Start: 2021-02-21 — End: 2023-05-22

## 2021-02-21 MED ORDER — SODIUM CHLORIDE 0.9 % IV SOLN
INTRAVENOUS | Status: DC
Start: 2021-02-21 — End: 2021-02-21

## 2021-02-21 NOTE — Discharge Summary (Signed)
Physician Discharge Summary  Karen Aguilar ZOX:[id number] DOB: 06/13/1965 DOA: 02/19/2021  PCP: Ria Bush, MD  Admit date: 02/19/2021 Discharge date: 02/21/2021  Admitted From: Home Disposition:  Home  Recommendations for Outpatient Follow-up:  Follow up with PCP in 1-2 weeks Please obtain BMP/CBC in one week your next doctors visit.  Zofran ODT as needed tablets given for nausea Advised oral hydration at home Would benefit from repeat ultrasound for liver hemangioma reevaluation in about 6 months  Home Health: None Equipment/Devices: None Discharge Condition: Stable CODE STATUS: Full code Diet recommendation: Slowly advance as tolerated at home  Brief/Interim Summary: 56 year old with history of IBS, esophageal dysfunction followed at [facility], anxiety, depression, thyroid disease, fibromyalgia, chronic pain, trigeminal neuropathy comes to the hospital with ongoing diarrhea and vomiting for the past 3 days.  Upon admission CT of the abdomen pelvis was negative but she met sepsis criteria likely from acute gastroenteritis.  She was initially started on vancomycin cefepime and Flagyl but she developed "red man" syndrome from vancomycin.  Antibiotics were changed to Cipro and Flagyl.  She was also hypotensive requiring fluid resuscitation and midodrine.  Over 24 hours her symptoms and vitals improved.  GI panel was positive for norovirus.  The following day she was tolerating orals therefore she was deemed stable for discharge.  All the questions answered by me.     Assessment and Plan: * Severe sepsis ([place]) Suspect secondary to acute gastroenteritis from norovirus.  Blood pressure is slightly soft but encourage oral fluids.  Additionally we will give gentle hydration.  Discontinue antibiotics.  UA is negative.  Chest x-ray is unremarkable.  COVID/flu negative.       Acute gastroenteritis GI panel was positive for norovirus.  C. difficile-negative We will discontinue  antibiotics Clear liquid diet, advance as tolerated IV fluids, IV Protonix and IV antiemetics CT shows chronic stable liver hemangioma but no other acute abnormalities 6 Chest x-ray-negative  Depression with anxiety Continue home lorazepam  Trigeminal neuralgia- (present on admission) Follows with neurology  Irritable bowel syndrome- (present on admission) Protonix Hold home Colace  Chronic pain syndrome- (present on admission) Continue home medications.  Follows with outpatient [place] pain.  Fibromyalgia- (present on admission) Continue baclofen and tapentadol/Nucynta  Hypothyroidism- (present on admission) Continue levothyroxine       Body mass index is 25.85 kg/m.         Discharge Diagnoses:  Principal Problem:   Severe sepsis ([place]) Active Problems:   Acute gastroenteritis   Hypothyroidism   Fibromyalgia   Chronic pain syndrome   Irritable bowel syndrome   Trigeminal neuralgia   Depression with anxiety      Consultations: None  Subjective: Feels well tolerating oral.  She would like to go home if possible today.  Discharge Exam: Vitals:   02/21/21 0527 02/21/21 0804  BP: (!) 93/58 101/67  Pulse: 81 73  Resp: 18 18  Temp: 98.5 F (36.9 C) 97.8 F (36.6 C)  SpO2: 97% 96%   Vitals:   02/21/21 0004 02/21/21 0006 02/21/21 0527 02/21/21 0804  BP: (!) 98/56 115/61 (!) 93/58 101/67  Pulse: 72 75 81 73  Resp: $Remo'18  18 18  'Vucxq$ Temp: 98.2 F (36.8 C)  98.5 F (36.9 C) 97.8 F (36.6 C)  TempSrc:    Oral  SpO2: 97% 99% 97% 96%  Weight:      Height:        General: Pt is alert, awake, not in acute distress Cardiovascular: RRR, S1/S2 +, no rubs,  no gallops Respiratory: CTA bilaterally, no wheezing, no rhonchi Abdominal: Soft, NT, ND, bowel sounds + Extremities: no edema, no cyanosis  Discharge Instructions  Discharge Instructions     Discharge patient   Complete by: As directed    Discharge disposition: 01-Home or Self Care    Discharge patient date: 02/21/2021      Allergies as of 02/21/2021       Reactions   Augmentin [amoxicillin-pot Clavulanate] Swelling   Lip swelling and irritation.    Fluconazole Swelling   Lips - swelling, bleeding, blisters, cracking, peeling   Erythromycin Nausea And Vomiting   Influenza Vaccines Hives, Swelling   Redness, heat, and extreme swelling at site   Levothyroxine Hives   Thimerosal Swelling   Local reaction-redness,swelling; veins popping   Tomato Other (See Comments)   Severe acid reflux with all acid foods   Adhesive [tape] Rash   blisters   Azithromycin Rash   Gabapentin Other (See Comments)   Short term memory loss   Imitrex [sumatriptan] Other (See Comments)   Rebound migraine    Lyrica [pregabalin] Other (See Comments)   Short term memory loss   Macrodantin [nitrofurantoin] Rash   Macrolides And Ketolides Rash   Nickel Swelling, Rash   Sulfa Antibiotics Rash   Sulfasalazine Rash   Troleandomycin Rash   Vancomycin Itching, Rash   Developed Redman's, may require premedication and infusion rate reduction in the future        Medication List     TAKE these medications    baclofen 10 MG tablet Commonly known as: LIORESAL Take 10 mg by mouth 3 (three) times daily as needed. Takes 1.5 tablets 3 times a day.   calcium carbonate 750 MG chewable tablet Commonly known as: TUMS EX Chew 2-3 tablets by mouth 2 (two) times daily.   camphor-menthol lotion Commonly known as: SARNA Apply 1 application topically at bedtime as needed for itching.   diclofenac sodium 1 % Gel Commonly known as: VOLTAREN APPLY THIN LAYER EXTERNALLY TO THE AFFECTED AREA UP TO FOUR TIMES DAILY AS NEEDED   docusate sodium 100 MG capsule Commonly known as: COLACE Take 100 mg by mouth 2 (two) times daily. As needed   LORazepam 1 MG tablet Commonly known as: ATIVAN Take 0.5-1 tablets (0.5-1 mg total) by mouth 2 (two) times daily as needed for anxiety.   Nucynta ER 200 MG  Tb12 Generic drug: Tapentadol HCl Take 1 tablet by mouth 2 (two) times daily.   omeprazole 40 MG capsule Commonly known as: PRILOSEC Take 40 mg by mouth 2 (two) times daily.   ondansetron 4 MG disintegrating tablet Commonly known as: ZOFRAN-ODT Take 1 tablet (4 mg total) by mouth every 8 (eight) hours as needed for nausea or vomiting.   pediatric multivitamin-iron 15 MG chewable tablet Chew 1 tablet by mouth daily.   promethazine 25 MG tablet Commonly known as: PHENERGAN Take 1 tablet (25 mg total) by mouth every 8 (eight) hours as needed for nausea or vomiting.   sucralfate 1 g tablet Commonly known as: CARAFATE Take 1 g by mouth 4 (four) times daily.   thyroid 90 MG tablet Commonly known as: ARMOUR Take 1 tablet (90 mg total) by mouth daily. One day a week take 1/2 tablet   VITAMIN C PO Take 1 tablet by mouth as needed (for immune system support).   Vitamin D 50 MCG (2000 UT) Caps Take 1 capsule (2,000 Units total) by mouth daily.  Follow-up Information     Ria Bush, MD. Call on 02/24/2021.   Specialty: Family Medicine Why: Per Caryl Pina at office  They will call patient to schedule follow up appt Contact information: [place] [address] [phone]                Allergies  Allergen Reactions   Augmentin [Amoxicillin-Pot Clavulanate] Swelling    Lip swelling and irritation.    Fluconazole Swelling    Lips - swelling, bleeding, blisters, cracking, peeling   Erythromycin Nausea And Vomiting   Influenza Vaccines Hives and Swelling    Redness, heat, and extreme swelling at site   Levothyroxine Hives   Thimerosal Swelling    Local reaction-redness,swelling; veins popping   Tomato Other (See Comments)    Severe acid reflux with all acid foods   Adhesive [Tape] Rash    blisters   Azithromycin Rash   Gabapentin Other (See Comments)    Short term memory loss   Imitrex [Sumatriptan] Other (See Comments)    Rebound  migraine    Lyrica [Pregabalin] Other (See Comments)    Short term memory loss   Macrodantin [Nitrofurantoin] Rash   Macrolides And Ketolides Rash   Nickel Swelling and Rash   Sulfa Antibiotics Rash   Sulfasalazine Rash   Troleandomycin Rash   Vancomycin Itching and Rash    Developed Redman's, may require premedication and infusion rate reduction in the future    You were cared for by a hospitalist during your hospital stay. If you have any questions about your discharge medications or the care you received while you were in the hospital after you are discharged, you can call the unit and asked to speak with the hospitalist on call if the hospitalist that took care of you is not available. Once you are discharged, your primary care physician will handle any further medical issues. Please note that no refills for any discharge medications will be authorized once you are discharged, as it is imperative that you return to your primary care physician (or establish a relationship with a primary care physician if you do not have one) for your aftercare needs so that they can reassess your need for medications and monitor your lab values.   Procedures/Studies: DG Chest 1 View  Result Date: 02/19/2021 CLINICAL DATA:  Shortness of breath, tachycardia, fever. EXAM: CHEST  1 VIEW COMPARISON:  07/20/2020 FINDINGS: The cardiomediastinal contours are normal. The lungs are clear. Pulmonary vasculature is normal. No consolidation, pleural effusion, or pneumothorax. There is stimulator in place. Surgical hardware in the lower cervical spine is partially included. No acute osseous abnormalities are seen. IMPRESSION: No acute chest findings. Electronically Signed   By: Keith Rake M.D.   On: 02/19/2021 21:24   DG Cervical Spine Complete  Result Date: 01/25/2021 CLINICAL DATA:  Right cervical radiculopathy. EXAM: CERVICAL SPINE - COMPLETE 4+ VIEW COMPARISON:  None. FINDINGS: There is no acute fracture or  subluxation of the cervical spine. There is straightening of normal cervical lordosis. C4-C7 ankylosis and C6-C7 ACDF. The visualized posterior elements and odontoid appear intact. There is anatomic alignment of the lateral masses of C1 and C2. The soft tissues are unremarkable. IMPRESSION: 1. No acute findings. 2. Cervical fusion. Electronically Signed   By: Anner Crete M.D.   On: 01/25/2021 22:32   CT ABDOMEN PELVIS W CONTRAST  Result Date: 02/19/2021 CLINICAL DATA:  Abdominal pain, acute, nonlocalized. EXAM: CT ABDOMEN AND PELVIS WITH CONTRAST TECHNIQUE: Multidetector CT  imaging of the abdomen and pelvis was performed using the standard protocol following bolus administration of intravenous contrast. RADIATION DOSE REDUCTION: This exam was performed according to the departmental dose-optimization program which includes automated exposure control, adjustment of the mA and/or kV according to patient size and/or use of iterative reconstruction technique. CONTRAST:  118mL OMNIPAQUE IOHEXOL 300 MG/ML  SOLN COMPARISON:  04/25/2019.  04/03/2012.  02/12/2013. FINDINGS: Lower chest: Normal Hepatobiliary: 1.5 cm hemangioma at the dome of the liver as seen previously. 6 mm hemangioma within the lateral segment of the left lobe of the liver. 1.5 cm hemangioma at the caudal tip of the right lobe of the liver as seen previously. Previous cholecystectomy. Common duct is smaller than was seen previously. This measured up to 16 mm on the study of April 2021 but measures only 10 mm today. Pancreas: Normal.  Duodenal diverticulum is present. Spleen: Normal Adrenals/Urinary Tract: Adrenal glands are normal. Kidneys are normal. No cyst, mass, stone or hydronephrosis. Bladder is normal. Stomach/Bowel: Stomach and small intestine are normal. Normal appendix. Normal appearance of the colon. Normal amount of fecal matter. Vascular/Lymphatic: Aortic atherosclerosis, minimal. No aneurysm. IVC is normal. No adenopathy.  Reproductive: Normal.  No pelvic mass. Other: No free fluid or air. Musculoskeletal: Distant lumbar fusion surgeries L4 to the sacrum. No acute bone finding. IMPRESSION: No cause of the acute presentation is identified. No evidence of acute solid organ pathology or bowel pathology. Hemangiomas within the liver as seen previously, unchanged since 2014 normal range prominence of the extrahepatic biliary tree on today's examination, maximal diameter of the common bile duct 1 cm in this patient has had previous cholecystectomy. At the time of the examination in April 2021, the common duct measured up to 16 mm in diameter. No obstructing lesion was identified. Electronically Signed   By: Nelson Chimes M.D.   On: 02/19/2021 23:03     The results of significant diagnostics from this hospitalization (including imaging, microbiology, ancillary and laboratory) are listed below for reference.     Microbiology: Recent Results (from the past 240 hour(s))  Culture, blood (Routine x 2)     Status: None (Preliminary result)   Collection Time: 02/19/21  9:18 PM   Specimen: BLOOD  Result Value Ref Range Status   Specimen Description BLOOD LEFT ANTECUBITAL  Final   Special Requests   Final    BOTTLES DRAWN AEROBIC AND ANAEROBIC Blood Culture adequate volume   Culture   Final    NO GROWTH 2 DAYS Performed at [facility], [address], [place], [place] [address]    Report Status PENDING  Incomplete  Culture, blood (Routine x 2)     Status: None (Preliminary result)   Collection Time: 02/19/21  9:18 PM   Specimen: BLOOD  Result Value Ref Range Status   Specimen Description BLOOD RIGHT ANTECUBITAL  Final   Special Requests   Final    BOTTLES DRAWN AEROBIC AND ANAEROBIC Blood Culture adequate volume   Culture   Final    NO GROWTH 2 DAYS Performed at [facility], [address] [place], [place] [address]    Report Status PENDING  Incomplete  Resp Panel by RT-PCR (Flu A&B, Covid)  Nasopharyngeal Swab     Status: None   Collection Time: 02/19/21 10:10 PM   Specimen: Nasopharyngeal Swab; Nasopharyngeal(NP) swabs in vial transport medium  Result Value Ref Range Status   SARS Coronavirus 2 by RT PCR NEGATIVE NEGATIVE Final    Comment: (NOTE) SARS-CoV-2  target nucleic acids are NOT DETECTED.  The SARS-CoV-2 RNA is generally detectable in upper respiratory specimens during the acute phase of infection. The lowest concentration of SARS-CoV-2 viral copies this assay can detect is 138 copies/mL. A negative result does not preclude SARS-Cov-2 infection and should not be used as the sole basis for treatment or other patient management decisions. A negative result may occur with  improper specimen collection/handling, submission of specimen other than nasopharyngeal swab, presence of viral mutation(s) within the areas targeted by this assay, and inadequate number of viral copies(<138 copies/mL). A negative result must be combined with clinical observations, patient history, and epidemiological information. The expected result is Negative.  Fact Sheet for Patients:  EntrepreneurPulse.com.au  Fact Sheet for Healthcare Providers:  IncredibleEmployment.be  This test is no t yet approved or cleared by the [place] FDA and  has been authorized for detection and/or diagnosis of SARS-CoV-2 by FDA under an Emergency Use Authorization (EUA). This EUA will remain  in effect (meaning this test can be used) for the duration of the COVID-19 declaration under Section 564(b)(1) of the Act, 21 U.S.C.section 360bbb-3(b)(1), unless the authorization is terminated  or revoked sooner.       Influenza A by PCR NEGATIVE NEGATIVE Final   Influenza B by PCR NEGATIVE NEGATIVE Final    Comment: (NOTE) The Xpert Xpress SARS-CoV-2/FLU/RSV plus assay is intended as an aid in the diagnosis of influenza from Nasopharyngeal swab specimens and should not be  used as a sole basis for treatment. Nasal washings and aspirates are unacceptable for Xpert Xpress SARS-CoV-2/FLU/RSV testing.  Fact Sheet for Patients: EntrepreneurPulse.com.au  Fact Sheet for Healthcare Providers: IncredibleEmployment.be  This test is not yet approved or cleared by the [place] FDA and has been authorized for detection and/or diagnosis of SARS-CoV-2 by FDA under an Emergency Use Authorization (EUA). This EUA will remain in effect (meaning this test can be used) for the duration of the COVID-19 declaration under Section 564(b)(1) of the Act, 21 U.S.C. section 360bbb-3(b)(1), unless the authorization is terminated or revoked.  Performed at [facility], [address] [place], [place] [address]   Gastrointestinal Panel by PCR , Stool     Status: Abnormal   Collection Time: 02/20/21  5:33 PM   Specimen: Stool  Result Value Ref Range Status   Campylobacter species NOT DETECTED NOT DETECTED Final   Plesimonas shigelloides NOT DETECTED NOT DETECTED Final   Salmonella species NOT DETECTED NOT DETECTED Final   Yersinia enterocolitica NOT DETECTED NOT DETECTED Final   Vibrio species NOT DETECTED NOT DETECTED Final   Vibrio cholerae NOT DETECTED NOT DETECTED Final   Enteroaggregative E coli (EAEC) NOT DETECTED NOT DETECTED Final   Enteropathogenic E coli (EPEC) NOT DETECTED NOT DETECTED Final   Enterotoxigenic E coli (ETEC) NOT DETECTED NOT DETECTED Final   Shiga like toxin producing E coli (STEC) NOT DETECTED NOT DETECTED Final   Shigella/Enteroinvasive E coli (EIEC) NOT DETECTED NOT DETECTED Final   Cryptosporidium NOT DETECTED NOT DETECTED Final   Cyclospora cayetanensis NOT DETECTED NOT DETECTED Final   Entamoeba histolytica NOT DETECTED NOT DETECTED Final   Giardia lamblia NOT DETECTED NOT DETECTED Final   Adenovirus F40/41 NOT DETECTED NOT DETECTED Final   Astrovirus NOT DETECTED NOT DETECTED Final   Norovirus  GI/GII DETECTED (A) NOT DETECTED Final    Comment: RESULT CALLED TO, READ BACK BY AND VERIFIED WITH: [place] 02/20/21 MU    Rotavirus A NOT DETECTED NOT DETECTED Final  Sapovirus (I, II, IV, and V) NOT DETECTED NOT DETECTED Final    Comment: Performed at [facility], [address] [place], [place] [address]  C Difficile Quick Screen w PCR reflex     Status: None   Collection Time: 02/20/21  5:34 PM   Specimen: STOOL  Result Value Ref Range Status   C Diff antigen NEGATIVE NEGATIVE Final   C Diff toxin NEGATIVE NEGATIVE Final   C Diff interpretation No C. difficile detected.  Final    Comment: Performed at [facility], [address] [place], [place] [address]     Labs: BNP (last 3 results) No results for input(s): BNP in the last 8760 hours. Basic Metabolic Panel: Recent Labs  Lab 02/19/21 2118 02/20/21 0430 02/21/21 0530  NA 139 135 140  K 3.6 3.3* 3.6  CL 98 102 105  CO2 $Re'25 27 29  'LjM$ GLUCOSE 161* 113* 95  BUN $Re'20 16 6  'trE$ CREATININE 1.05* 0.80 0.79  CALCIUM 9.7 8.0* 8.4*  MG  --   --  1.6*   Liver Function Tests: Recent Labs  Lab 02/19/21 2118 02/21/21 0530  AST 30 16  ALT 18 14  ALKPHOS 88 50  BILITOT 0.8 0.4  PROT 8.4* 5.0*  ALBUMIN 4.3 2.8*   Recent Labs  Lab 02/19/21 2130  LIPASE 28   No results for input(s): AMMONIA in the last 168 hours. CBC: Recent Labs  Lab 02/19/21 2118 02/20/21 0430 02/20/21 0722 02/21/21 0530  WBC 14.9* 9.5 8.7 5.2  NEUTROABS 14.1*  --   --   --   HGB 16.3* 10.2* 10.3* 10.4*  HCT 49.0* 30.9* 31.7* 32.3*  MCV 84.2 86.1 86.4 87.3  PLT 395 213 208 202   Cardiac Enzymes: No results for input(s): CKTOTAL, CKMB, CKMBINDEX, TROPONINI in the last 168 hours. BNP: Invalid input(s): POCBNP CBG: No results for input(s): GLUCAP in the last 168 hours. D-Dimer No results for input(s): DDIMER in the last 72 hours. Hgb A1c No results for input(s): HGBA1C in the last 72 hours. Lipid Profile No  results for input(s): CHOL, HDL, LDLCALC, TRIG, CHOLHDL, LDLDIRECT in the last 72 hours. Thyroid function studies Recent Labs    02/19/21 2352  TSH 0.416   Anemia work up No results for input(s): VITAMINB12, FOLATE, FERRITIN, TIBC, IRON, RETICCTPCT in the last 72 hours. Urinalysis    Component Value Date/Time   COLORURINE YELLOW (A) 02/19/2021 2210   APPEARANCEUR CLEAR (A) 02/19/2021 2210   LABSPEC >1.046 (H) 02/19/2021 2210   PHURINE 5.0 02/19/2021 2210   GLUCOSEU NEGATIVE 02/19/2021 2210   HGBUR NEGATIVE 02/19/2021 2210   BILIRUBINUR NEGATIVE 02/19/2021 2210   BILIRUBINUR 1+ 08/26/2019 1414   KETONESUR 20 (A) 02/19/2021 2210   PROTEINUR NEGATIVE 02/19/2021 2210   UROBILINOGEN 2.0 (A) 08/26/2019 1414   NITRITE NEGATIVE 02/19/2021 2210   LEUKOCYTESUR NEGATIVE 02/19/2021 2210   Sepsis Labs Invalid input(s): PROCALCITONIN,  WBC,  LACTICIDVEN Microbiology Recent Results (from the past 240 hour(s))  Culture, blood (Routine x 2)     Status: None (Preliminary result)   Collection Time: 02/19/21  9:18 PM   Specimen: BLOOD  Result Value Ref Range Status   Specimen Description BLOOD LEFT ANTECUBITAL  Final   Special Requests   Final    BOTTLES DRAWN AEROBIC AND ANAEROBIC Blood Culture adequate volume   Culture   Final    NO GROWTH 2 DAYS Performed at [facility], [address] [place], [place] [address]  Report Status PENDING  Incomplete  Culture, blood (Routine x 2)     Status: None (Preliminary result)   Collection Time: 02/19/21  9:18 PM   Specimen: BLOOD  Result Value Ref Range Status   Specimen Description BLOOD RIGHT ANTECUBITAL  Final   Special Requests   Final    BOTTLES DRAWN AEROBIC AND ANAEROBIC Blood Culture adequate volume   Culture   Final    NO GROWTH 2 DAYS Performed at [facility], [address] [place], [place] [address]    Report Status PENDING  Incomplete  Resp Panel by RT-PCR (Flu A&B, Covid) Nasopharyngeal Swab     Status:  None   Collection Time: 02/19/21 10:10 PM   Specimen: Nasopharyngeal Swab; Nasopharyngeal(NP) swabs in vial transport medium  Result Value Ref Range Status   SARS Coronavirus 2 by RT PCR NEGATIVE NEGATIVE Final    Comment: (NOTE) SARS-CoV-2 target nucleic acids are NOT DETECTED.  The SARS-CoV-2 RNA is generally detectable in upper respiratory specimens during the acute phase of infection. The lowest concentration of SARS-CoV-2 viral copies this assay can detect is 138 copies/mL. A negative result does not preclude SARS-Cov-2 infection and should not be used as the sole basis for treatment or other patient management decisions. A negative result may occur with  improper specimen collection/handling, submission of specimen other than nasopharyngeal swab, presence of viral mutation(s) within the areas targeted by this assay, and inadequate number of viral copies(<138 copies/mL). A negative result must be combined with clinical observations, patient history, and epidemiological information. The expected result is Negative.  Fact Sheet for Patients:  EntrepreneurPulse.com.au  Fact Sheet for Healthcare Providers:  IncredibleEmployment.be  This test is no t yet approved or cleared by the [place] FDA and  has been authorized for detection and/or diagnosis of SARS-CoV-2 by FDA under an Emergency Use Authorization (EUA). This EUA will remain  in effect (meaning this test can be used) for the duration of the COVID-19 declaration under Section 564(b)(1) of the Act, 21 U.S.C.section 360bbb-3(b)(1), unless the authorization is terminated  or revoked sooner.       Influenza A by PCR NEGATIVE NEGATIVE Final   Influenza B by PCR NEGATIVE NEGATIVE Final    Comment: (NOTE) The Xpert Xpress SARS-CoV-2/FLU/RSV plus assay is intended as an aid in the diagnosis of influenza from Nasopharyngeal swab specimens and should not be used as a sole basis for  treatment. Nasal washings and aspirates are unacceptable for Xpert Xpress SARS-CoV-2/FLU/RSV testing.  Fact Sheet for Patients: EntrepreneurPulse.com.au  Fact Sheet for Healthcare Providers: IncredibleEmployment.be  This test is not yet approved or cleared by the [place] FDA and has been authorized for detection and/or diagnosis of SARS-CoV-2 by FDA under an Emergency Use Authorization (EUA). This EUA will remain in effect (meaning this test can be used) for the duration of the COVID-19 declaration under Section 564(b)(1) of the Act, 21 U.S.C. section 360bbb-3(b)(1), unless the authorization is terminated or revoked.  Performed at [facility], [address] [place], [place] [address]   Gastrointestinal Panel by PCR , Stool     Status: Abnormal   Collection Time: 02/20/21  5:33 PM   Specimen: Stool  Result Value Ref Range Status   Campylobacter species NOT DETECTED NOT DETECTED Final   Plesimonas shigelloides NOT DETECTED NOT DETECTED Final   Salmonella species NOT DETECTED NOT DETECTED Final   Yersinia enterocolitica NOT DETECTED NOT DETECTED Final   Vibrio species NOT DETECTED NOT DETECTED Final  Vibrio cholerae NOT DETECTED NOT DETECTED Final   Enteroaggregative E coli (EAEC) NOT DETECTED NOT DETECTED Final   Enteropathogenic E coli (EPEC) NOT DETECTED NOT DETECTED Final   Enterotoxigenic E coli (ETEC) NOT DETECTED NOT DETECTED Final   Shiga like toxin producing E coli (STEC) NOT DETECTED NOT DETECTED Final   Shigella/Enteroinvasive E coli (EIEC) NOT DETECTED NOT DETECTED Final   Cryptosporidium NOT DETECTED NOT DETECTED Final   Cyclospora cayetanensis NOT DETECTED NOT DETECTED Final   Entamoeba histolytica NOT DETECTED NOT DETECTED Final   Giardia lamblia NOT DETECTED NOT DETECTED Final   Adenovirus F40/41 NOT DETECTED NOT DETECTED Final   Astrovirus NOT DETECTED NOT DETECTED Final   Norovirus GI/GII DETECTED (A) NOT  DETECTED Final    Comment: RESULT CALLED TO, READ BACK BY AND VERIFIED WITH: [place] 02/20/21 MU    Rotavirus A NOT DETECTED NOT DETECTED Final   Sapovirus (I, II, IV, and V) NOT DETECTED NOT DETECTED Final    Comment: Performed at [facility], [address] [place], [place] [address]  C Difficile Quick Screen w PCR reflex     Status: None   Collection Time: 02/20/21  5:34 PM   Specimen: STOOL  Result Value Ref Range Status   C Diff antigen NEGATIVE NEGATIVE Final   C Diff toxin NEGATIVE NEGATIVE Final   C Diff interpretation No C. difficile detected.  Final    Comment: Performed at [facility], [address] [place], [place] [address]     Time coordinating discharge:  I have spent 35 minutes face to face with the patient and on the ward discussing the patients care, assessment, plan and disposition with other care givers. >50% of the time was devoted counseling the patient about the risks and benefits of treatment/Discharge disposition and coordinating care.   SIGNED:   Damita Lack, MD  [facility] 02/21/2021, 11:12 AM   If 7PM-7AM, please contact night-coverage

## 2021-02-21 NOTE — Telephone Encounter (Signed)
[facility] called to schedule a HFU appt for 1wk  No appts available

## 2021-02-22 ENCOUNTER — Telehealth: Payer: Self-pay

## 2021-02-22 NOTE — Telephone Encounter (Signed)
Spoke with Karen Aguilar offering 12:30 on 02/23/21.  Karen Aguilar agrees to [place] for hosp f/u.  Plz add Karen Aguilar to Dr. Synthia Innocent schedule on 02/23/21 at 12:30 for hospital f/u.  Karen Aguilar is aware.

## 2021-02-22 NOTE — Telephone Encounter (Signed)
Transition Care Management Follow-up Telephone Call Date of discharge and from where: TCM DC [facility] 02-21-21 Dx: severe sepsis  How have you been since you were released from the hospital? Feeling some better  Any questions or concerns? No  Items Reviewed: Did the pt receive and understand the discharge instructions provided? Yes  Medications obtained and verified? Yes  Other? No  Any new allergies since your discharge? No  Dietary orders reviewed? Yes Do you have support at home? Yes   Home Care and Equipment/Supplies: Were home health services ordered? no If so, what is the name of the agency? na  Has the agency set up a time to come to the patient's home? not applicable Were any new equipment or medical supplies ordered?  No What is the name of the medical supply agency? na Were you able to get the supplies/equipment? not applicable Do you have any questions related to the use of the equipment or supplies? No  Functional Questionnaire: (I = Independent and D = Dependent) ADLs: I  Bathing/Dressing- I  Meal Prep- I  Eating- I  Maintaining continence- I  Transferring/Ambulation- I  Managing Meds- I  Follow up appointments reviewed:  PCP Hospital f/u appt confirmed? No- pt only wants to see Dr Danise Mina and no appts available- pt has been having rapid heart rate and needs to be seen soon- will ask front desk to call patient  [facility] f/u appt confirmed? No  Are transportation arrangements needed? No  If their condition worsens, is the pt aware to call PCP or go to the Emergency Dept.? Yes Was the patient provided with contact information for the PCP's office or ED? Yes Was to pt encouraged to call back with questions or concerns? Yes

## 2021-02-22 NOTE — Telephone Encounter (Signed)
Pt has been scheduled.  °

## 2021-02-23 ENCOUNTER — Ambulatory Visit: Payer: Medicare Other | Admitting: Family Medicine

## 2021-02-23 ENCOUNTER — Other Ambulatory Visit: Payer: Self-pay

## 2021-02-23 ENCOUNTER — Encounter: Payer: Self-pay | Admitting: Family Medicine

## 2021-02-23 VITALS — BP 134/82 | HR 92 | Temp 97.5°F | Ht 68.0 in | Wt 172.4 lb

## 2021-02-23 DIAGNOSIS — A0811 Acute gastroenteropathy due to Norwalk agent: Secondary | ICD-10-CM

## 2021-02-23 DIAGNOSIS — K582 Mixed irritable bowel syndrome: Secondary | ICD-10-CM

## 2021-02-23 DIAGNOSIS — K219 Gastro-esophageal reflux disease without esophagitis: Secondary | ICD-10-CM

## 2021-02-23 DIAGNOSIS — R002 Palpitations: Secondary | ICD-10-CM

## 2021-02-23 DIAGNOSIS — R112 Nausea with vomiting, unspecified: Secondary | ICD-10-CM | POA: Diagnosis not present

## 2021-02-23 DIAGNOSIS — K529 Noninfective gastroenteritis and colitis, unspecified: Secondary | ICD-10-CM

## 2021-02-23 DIAGNOSIS — A419 Sepsis, unspecified organism: Secondary | ICD-10-CM

## 2021-02-23 DIAGNOSIS — R6520 Severe sepsis without septic shock: Secondary | ICD-10-CM

## 2021-02-23 LAB — COMPREHENSIVE METABOLIC PANEL
ALT: 27 U/L (ref 0–35)
AST: 33 U/L (ref 0–37)
Albumin: 3.7 g/dL (ref 3.5–5.2)
Alkaline Phosphatase: 64 U/L (ref 39–117)
BUN: 4 mg/dL — ABNORMAL LOW (ref 6–23)
CO2: 39 mEq/L — ABNORMAL HIGH (ref 19–32)
Calcium: 8.9 mg/dL (ref 8.4–10.5)
Chloride: 100 mEq/L (ref 96–112)
Creatinine, Ser: 0.81 mg/dL (ref 0.40–1.20)
GFR: 82.07 mL/min (ref >60.00)
Glucose, Bld: 98 mg/dL (ref 70–99)
Potassium: 3.4 mEq/L — ABNORMAL LOW (ref 3.5–5.1)
Sodium: 140 mEq/L (ref 135–145)
Total Bilirubin: 0.3 mg/dL (ref 0.2–1.2)
Total Protein: 6.3 g/dL (ref 6.0–8.3)

## 2021-02-23 LAB — CBC WITH DIFFERENTIAL/PLATELET
Basophils Absolute: 0 10*3/uL (ref 0.0–0.1)
Basophils Relative: 0.2 % (ref 0.0–3.0)
Eosinophils Absolute: 0 10*3/uL (ref 0.0–0.7)
Eosinophils Relative: 0 % (ref 0.0–5.0)
HCT: 36.8 % (ref 36.0–46.0)
Hemoglobin: 12.3 g/dL (ref 12.0–15.0)
Lymphocytes Relative: 28.1 % (ref 12.0–46.0)
Lymphs Abs: 1.3 10*3/uL (ref 0.7–4.0)
MCHC: 33.5 g/dL (ref 30.0–36.0)
MCV: 84.3 fl (ref 78.0–100.0)
Monocytes Absolute: 0.3 10*3/uL (ref 0.1–1.0)
Monocytes Relative: 7.2 % (ref 3.0–12.0)
Neutro Abs: 3 10*3/uL (ref 1.4–7.7)
Neutrophils Relative %: 64.5 % (ref 43.0–77.0)
Platelets: 268 10*3/uL (ref 150.0–400.0)
RBC: 4.37 Mil/uL (ref 3.87–5.11)
RDW: 12.7 % (ref 11.5–15.5)
WBC: 4.7 10*3/uL (ref 4.0–10.5)

## 2021-02-23 LAB — MAGNESIUM: Magnesium: 1.8 mg/dL (ref 1.5–2.5)

## 2021-02-23 NOTE — Assessment & Plan Note (Addendum)
Sepsis after norovirus infection as evidenced by elevated lactic acid, tachycardia, hypotension s/p IV fluid replacement. Lactic acid had normalized prior to discharge.  Will update labs today including CMP, CBC, magnesium.

## 2021-02-23 NOTE — Assessment & Plan Note (Addendum)
Discussed with patient.  She had acute predominantly gastritis due to norovirus infection with resultant sepsis.  She did have low protein and albumin levels prior to discharge.  She continues with symptoms since at home. Reviewed dietary recommendations. Update CMP.

## 2021-02-23 NOTE — Assessment & Plan Note (Signed)
Severe, managed with ppi bid and carafate daily - she will increase carafate some in effort to improve GI symptoms.

## 2021-02-23 NOTE — Assessment & Plan Note (Signed)
Managing with zofran ODT PRN.

## 2021-02-23 NOTE — Patient Instructions (Addendum)
Labs today  EKG today  Continue current medicines.  Continue clear liquid diet until GI symptoms are improved then slowly advance to bland diet and as tolerated.

## 2021-02-23 NOTE — Progress Notes (Signed)
Patient ID: Karen Aguilar, female    DOB: Nov 28, 1965, 56 y.o.   MRN: [id number]  This visit was conducted in person.  BP 134/82    Pulse 92    Temp (!) 97.5 F (36.4 C) (Temporal)    Ht 5\' 8"  (1.727 m)    Wt 172 lb 6 oz (78.2 kg)    LMP 10/04/2014 Comment: per labs   SpO2 96%    BMI 26.21 kg/m    CC: hosp f/u visit  Subjective:   HPI: Karen Aguilar is a 56 y.o. female presenting on 02/23/2021 for Hospitalization Follow-up (Admitted on 02/19/21 at [facility]; dx sepsis due to unspecified organism.)   Recent hospitalization for vomiting x 3 days, sepsis due to virus. Emesis was dark green and brown. She was also tachycardic and with dyspnea. Never loose stools but she did have frequent stools.  Hospital records reviewed. Med rec performed.  CT abd/pelvis returned normal. She was started empirically on vancomycin, cefepime and flagyl, developed red man syndrome from vancomycin. Abx regimen changed to cipro/flagyl. Hypotension treated with IV fluids and midodrine. GI panel returned positive for norovirus - antibiotics discontinued. No one else sick at home. Nausea/vomiting treated with zofran ODT.   Nausea/vomiting refractory to home treatment with phenergan.  Since home not feeling well.  She had episode of irregular heart beat on Tuesday night - Apple watch said she had Afib.  Only eating clears - gatorade, jello, italian ice, 1/2 muffin.   Incidentally noted to have liver hemangioma - this is a chronic stable issue.   She regularly takes carafate once daily as well as omeprazole 40mg  bid.   Home health not needed.  Other follow up appointments scheduled: none ______________________________________________________________________ Hospital admission: 02/19/2021 Hospital discharge: 02/21/2021 TCM f/u phone call: completed on 02/22/2021   Admitted From: Home Disposition:  Home  Discharge Diagnoses:  Principal Problem:   Severe sepsis ([place]) Active Problems:   Acute gastroenteritis    Hypothyroidism   Fibromyalgia   Chronic pain syndrome   Irritable bowel syndrome   Trigeminal neuralgia   Depression with anxiety   Recommendations for Outpatient Follow-up:  Follow up with PCP in 1-2 weeks Please obtain BMP/CBC in one week your next doctors visit.  Zofran ODT as needed tablets given for nausea Advised oral hydration at home Would benefit from repeat ultrasound for liver hemangioma reevaluation in about 6 months   Home Health: None Equipment/Devices: None Discharge Condition: Stable CODE STATUS: Full code Diet recommendation: Slowly advance as tolerated at home     Relevant past medical, surgical, family and social history reviewed and updated as indicated. Interim medical history since our last visit reviewed. Allergies and medications reviewed and updated. Outpatient Medications Prior to Visit  Medication Sig Dispense Refill   Ascorbic Acid (VITAMIN C PO) Take 1 tablet by mouth as needed (for immune system support).      baclofen (LIORESAL) 10 MG tablet Take 10 mg by mouth 3 (three) times daily as needed. Takes 1.5 tablets 3 times a day.     calcium carbonate (TUMS EX) 750 MG chewable tablet Chew 2-3 tablets by mouth 2 (two) times daily.     camphor-menthol (SARNA) lotion Apply 1 application topically at bedtime as needed for itching.      Cholecalciferol (VITAMIN D) 2000 units CAPS Take 1 capsule (2,000 Units total) by mouth daily. 30 capsule    diclofenac sodium (VOLTAREN) 1 % GEL APPLY THIN LAYER EXTERNALLY TO THE AFFECTED AREA UP  TO FOUR TIMES DAILY AS NEEDED 300 g 0   docusate sodium (COLACE) 100 MG capsule Take 100 mg by mouth 2 (two) times daily. As needed     LORazepam (ATIVAN) 1 MG tablet Take 0.5-1 tablets (0.5-1 mg total) by mouth 2 (two) times daily as needed for anxiety. 60 tablet 0   NUCYNTA ER 200 MG TB12 Take 1 tablet by mouth 2 (two) times daily.     omeprazole (PRILOSEC) 40 MG capsule Take 40 mg by mouth 2 (two) times daily.     ondansetron  (ZOFRAN-ODT) 4 MG disintegrating tablet Take 1 tablet (4 mg total) by mouth every 8 (eight) hours as needed for nausea or vomiting. 20 tablet 0   pediatric multivitamin-iron (POLY-VI-SOL WITH IRON) 15 MG chewable tablet Chew 1 tablet by mouth daily.     sucralfate (CARAFATE) 1 g tablet Take 1 g by mouth 4 (four) times daily.     thyroid (ARMOUR) 90 MG tablet Take 1 tablet (90 mg total) by mouth daily. One day a week take 1/2 tablet     No facility-administered medications prior to visit.     Per HPI unless specifically indicated in ROS section below Review of Systems  Objective:  BP 134/82    Pulse 92    Temp (!) 97.5 F (36.4 C) (Temporal)    Ht 5\' 8"  (1.727 m)    Wt 172 lb 6 oz (78.2 kg)    LMP 10/04/2014 Comment: per labs   SpO2 96%    BMI 26.21 kg/m   Wt Readings from Last 3 Encounters:  02/23/21 172 lb 6 oz (78.2 kg)  02/19/21 170 lb (77.1 kg)  01/24/21 172 lb 2 oz (78.1 kg)      Physical Exam Vitals and nursing note reviewed.  Constitutional:      Appearance: Normal appearance. She is not ill-appearing.  Cardiovascular:     Rate and Rhythm: Normal rate and regular rhythm.     Pulses: Normal pulses.     Heart sounds: Normal heart sounds. No murmur heard. Pulmonary:     Effort: Pulmonary effort is normal. No respiratory distress.     Breath sounds: Normal breath sounds. No wheezing, rhonchi or rales.  Abdominal:     General: Bowel sounds are normal. There is no distension.     Palpations: Abdomen is soft. There is no mass.     Tenderness: There is abdominal tenderness (mild discomfort) in the epigastric area. There is no guarding or rebound. Negative signs include Murphy's sign.     Hernia: No hernia is present.  Musculoskeletal:        General: Normal range of motion.     Right lower leg: No edema.     Left lower leg: No edema.  Skin:    General: Skin is warm and dry.     Findings: No rash.  Neurological:     Mental Status: She is alert.  Psychiatric:        Mood  and Affect: Mood normal.        Behavior: Behavior normal.      EKG - NSR rate 75, normal axis, intervals, no hypertrophy or acute ST/T changes   Assessment & Plan:  This visit occurred during the SARS-CoV-2 public health emergency.  Safety protocols were in place, including screening questions prior to the visit, additional usage of staff PPE, and extensive cleaning of exam room while observing appropriate contact time as indicated for disinfecting solutions.   Problem List  Items Addressed This Visit     GERD (gastroesophageal reflux disease)    Severe, managed with ppi bid and carafate daily - she will increase carafate some in effort to improve GI symptoms.       Irritable bowel syndrome    H/o this, severe.       Non-intractable vomiting with nausea    Managing with zofran ODT PRN.       Severe sepsis (HCC)    Sepsis after norovirus infection as evidenced by elevated lactic acid, tachycardia, hypotension s/p IV fluid replacement. Lactic acid had normalized prior to discharge.  Will update labs today including CMP, CBC, magnesium.       Acute gastroenteropathy due to Norovirus - Primary    Discussed with patient.  She had acute predominantly gastritis due to norovirus infection with resultant sepsis.  She did have low protein and albumin levels prior to discharge.  She continues with symptoms since at home. Reviewed dietary recommendations. Update CMP.       Relevant Orders   CBC with Differential/Platelet   Comprehensive metabolic panel   Magnesium   Palpitations    Recent episodes of tachypalpitations in setting of recent acute gastroenteritis. Her apple watch captured irregular heart beats, labeled "afib". On my review difficult to tell if truly afib. EKG today reassuring. Advised let us know if recurrent irregular heart beats/palpitations to refer to cards for consideration of cardiac monitor.       Relevant Orders   EKG 12-Lead (Completed)     No orders of the  defined types were placed in this encounter.  Orders Placed This Encounter  Procedures   CBC with Differential/Platelet   Comprehensive metabolic panel   Magnesium   EKG 12-Lead     Patient Instructions  Labs today  EKG today  Continue current medicines.  Continue clear liquid diet until GI symptoms are improved then slowly advance to bland diet and as tolerated.   Follow up plan: Return if symptoms worsen or fail to improve.  Ria Bush, MD

## 2021-02-23 NOTE — Assessment & Plan Note (Addendum)
Recent episodes of tachypalpitations in setting of recent acute gastroenteritis. Her apple watch captured irregular heart beats, labeled "afib". On my review difficult to tell if truly afib. EKG today reassuring. Advised let us know if recurrent irregular heart beats/palpitations to refer to cards for consideration of cardiac monitor.

## 2021-02-23 NOTE — Assessment & Plan Note (Signed)
H/o this, severe.

## 2021-02-24 LAB — CULTURE, BLOOD (ROUTINE X 2)
Culture: NO GROWTH
Culture: NO GROWTH
Special Requests: ADEQUATE
Special Requests: ADEQUATE

## 2021-02-25 ENCOUNTER — Encounter: Payer: Self-pay | Admitting: Family Medicine

## 2021-03-15 ENCOUNTER — Ambulatory Visit: Payer: Medicare Other | Admitting: Family Medicine

## 2021-03-15 ENCOUNTER — Encounter: Payer: Self-pay | Admitting: Family Medicine

## 2021-03-15 ENCOUNTER — Other Ambulatory Visit: Payer: Self-pay

## 2021-03-15 VITALS — BP 122/82 | HR 103 | Temp 97.3°F | Ht 68.0 in | Wt 165.0 lb

## 2021-03-15 DIAGNOSIS — K3184 Gastroparesis: Secondary | ICD-10-CM

## 2021-03-15 DIAGNOSIS — R7989 Other specified abnormal findings of blood chemistry: Secondary | ICD-10-CM | POA: Insufficient documentation

## 2021-03-15 DIAGNOSIS — A0811 Acute gastroenteropathy due to Norwalk agent: Secondary | ICD-10-CM | POA: Diagnosis not present

## 2021-03-15 DIAGNOSIS — M797 Fibromyalgia: Secondary | ICD-10-CM

## 2021-03-15 DIAGNOSIS — R112 Nausea with vomiting, unspecified: Secondary | ICD-10-CM | POA: Diagnosis not present

## 2021-03-15 NOTE — Assessment & Plan Note (Addendum)
Ongoing GI symptoms since norovirus infection last month with residual cramping, gassiness, bloating. ?Continue regular carafate and omeprazole, and prn zofran. Consider adding pepcid. Consider return to GI specialist.  ?Will need to verify if she's taking probiotic.  ?

## 2021-03-15 NOTE — Assessment & Plan Note (Signed)
This could contribute to mental fogginess  ?

## 2021-03-15 NOTE — Patient Instructions (Addendum)
Return tomorrow between 8-9am for labwork (cortisol test).  ?In the meantime, continue carafate and omeprazole and zofran as needed.  ?

## 2021-03-15 NOTE — Assessment & Plan Note (Addendum)
Ongoing GI symptoms after Norovirus illness last month. If ongoing, may need to return to GI (Dr Earnie Larsson at [facility]).  ?

## 2021-03-15 NOTE — Progress Notes (Signed)
? ? Patient ID: Karen Aguilar, female    DOB: [birth year], 56 y.o.   MRN: [id number] ? ?This visit was conducted in person. ? ?BP 122/82   Pulse (!) 103   Temp (!) 97.3 ?F (36.3 ?C) (Temporal)   Ht '5\' 8"'$  (1.727 m)   Wt 165 lb (74.8 kg)   LMP 10/04/2014 Comment: per labs  SpO2 96%   BMI 25.09 kg/m?   ?Orthostatic VS for the past 24 hrs (Last 3 readings): ? BP- Lying BP- Standing at 3 minutes  ?03/15/21 1212 -- 118/82  ?03/15/21 1209 116/84 --  ? ?CC: ongoing GI difficulty  ?Subjective:  ? ?HPI: ?Karen Aguilar is a 56 y.o. female presenting on 03/15/2021 for GI Problem (C/o ongoing GI issues. ) ? ? ?Recent hospitalization for norovirus gastroenteritis complicated by sepsis with hypotension.  ? ?Ongoing nausea, GI upset, abdominal cramping, gassiness, bloating despite carafate use 3-4 times daily. Episode last weekend with severe diarrhea/vomiting that lasted 6 hours. Ongoing constipation since then (carafate can cause constipation). Notes ongoing dizziness, mental fogginess, some vertigo, HA. She just started eating solids this week. Zofran through pain management has helped nausea.  ? ?Saw pain management who recommended she be evaluated for cortisol deficiency.  ? ?No recent steroid treatment.  ?She notes darker skin in private area.  ?She notes dizziness worse with standing.  ?No h/o hyperkalemia, or salt craving.  ? ?Contrasted CT abd/pelvis 02/2021- normal adrenal glands ?   ? ?Relevant past medical, surgical, family and social history reviewed and updated as indicated. Interim medical history since our last visit reviewed. ?Allergies and medications reviewed and updated. ?Outpatient Medications Prior to Visit  ?Medication Sig Dispense Refill  ? Ascorbic Acid (VITAMIN C PO) Take 1 tablet by mouth as needed (for immune system support).     ? baclofen (LIORESAL) 10 MG tablet Take 10 mg by mouth 3 (three) times daily as needed. Takes 0.5 tablets 3 times a day.    ? calcium carbonate (TUMS EX) 750 MG chewable  tablet Chew 2-3 tablets by mouth 2 (two) times daily.    ? camphor-menthol (SARNA) lotion Apply 1 application topically at bedtime as needed for itching.     ? Cholecalciferol (VITAMIN D) 2000 units CAPS Take 1 capsule (2,000 Units total) by mouth daily. 30 capsule   ? diclofenac sodium (VOLTAREN) 1 % GEL APPLY THIN LAYER EXTERNALLY TO THE AFFECTED AREA UP TO FOUR TIMES DAILY AS NEEDED 300 g 0  ? docusate sodium (COLACE) 100 MG capsule Take 100 mg by mouth 2 (two) times daily. As needed    ? LORazepam (ATIVAN) 1 MG tablet Take 0.5-1 tablets (0.5-1 mg total) by mouth 2 (two) times daily as needed for anxiety. 60 tablet 0  ? NUCYNTA ER 200 MG TB12 Take 1 tablet by mouth 2 (two) times daily.    ? omeprazole (PRILOSEC) 40 MG capsule Take 40 mg by mouth 2 (two) times daily.    ? ondansetron (ZOFRAN-ODT) 4 MG disintegrating tablet Take 1 tablet (4 mg total) by mouth every 8 (eight) hours as needed for nausea or vomiting. 20 tablet 0  ? pediatric multivitamin-iron (POLY-VI-SOL WITH IRON) 15 MG chewable tablet Chew 1 tablet by mouth daily.    ? sucralfate (CARAFATE) 1 g tablet Take 1 g by mouth 4 (four) times daily.    ? thyroid (ARMOUR) 90 MG tablet Take 1 tablet (90 mg total) by mouth daily. One day a week take 1/2 tablet    ? ?  No facility-administered medications prior to visit.  ?  ? ?Per HPI unless specifically indicated in ROS section below ?Review of Systems ? ?Objective:  ?BP 122/82   Pulse (!) 103   Temp (!) 97.3 ?F (36.3 ?C) (Temporal)   Ht '5\' 8"'$  (1.727 m)   Wt 165 lb (74.8 kg)   LMP 10/04/2014 Comment: per labs  SpO2 96%   BMI 25.09 kg/m?   ?Wt Readings from Last 3 Encounters:  ?03/15/21 165 lb (74.8 kg)  ?02/23/21 172 lb 6 oz (78.2 kg)  ?02/19/21 170 lb (77.1 kg)  ?  ?  ?Physical Exam ?Vitals and nursing note reviewed.  ?Constitutional:   ?   Appearance: Normal appearance. She is not ill-appearing.  ?Eyes:  ?   Extraocular Movements: Extraocular movements intact.  ?   Pupils: Pupils are equal, round, and  reactive to light.  ?Cardiovascular:  ?   Rate and Rhythm: Normal rate and regular rhythm.  ?   Pulses: Normal pulses.  ?   Heart sounds: Normal heart sounds. No murmur heard. ?Pulmonary:  ?   Effort: Pulmonary effort is normal. No respiratory distress.  ?   Breath sounds: Normal breath sounds. No wheezing, rhonchi or rales.  ?Abdominal:  ?   General: Bowel sounds are normal. There is no distension.  ?   Palpations: Abdomen is soft. There is no mass.  ?   Tenderness: There is generalized abdominal tenderness (worse discomfort RUQ and lower abdomen). There is no guarding or rebound. Negative signs include Murphy's sign.  ?   Hernia: No hernia is present.  ?Musculoskeletal:  ?   Right lower leg: No edema.  ?   Left lower leg: No edema.  ?Skin: ?   General: Skin is warm and dry.  ?   Findings: No rash.  ?Neurological:  ?   Mental Status: She is alert.  ?Psychiatric:     ?   Mood and Affect: Mood normal.     ?   Behavior: Behavior normal.  ? ?   ?Results for orders placed or performed in visit on 02/23/21  ?CBC with Differential/Platelet  ?Result Value Ref Range  ? WBC 4.7 4.0 - 10.5 K/uL  ? RBC 4.37 3.87 - 5.11 Mil/uL  ? Hemoglobin 12.3 12.0 - 15.0 g/dL  ? HCT 36.8 36.0 - 46.0 %  ? MCV 84.3 78.0 - 100.0 fl  ? MCHC 33.5 30.0 - 36.0 g/dL  ? RDW 12.7 11.5 - 15.5 %  ? Platelets 268.0 150.0 - 400.0 K/uL  ? Neutrophils Relative % 64.5 43.0 - 77.0 %  ? Lymphocytes Relative 28.1 12.0 - 46.0 %  ? Monocytes Relative 7.2 3.0 - 12.0 %  ? Eosinophils Relative 0.0 0.0 - 5.0 %  ? Basophils Relative 0.2 0.0 - 3.0 %  ? Neutro Abs 3.0 1.4 - 7.7 K/uL  ? Lymphs Abs 1.3 0.7 - 4.0 K/uL  ? Monocytes Absolute 0.3 0.1 - 1.0 K/uL  ? Eosinophils Absolute 0.0 0.0 - 0.7 K/uL  ? Basophils Absolute 0.0 0.0 - 0.1 K/uL  ?Comprehensive metabolic panel  ?Result Value Ref Range  ? Sodium 140 135 - 145 mEq/L  ? Potassium 3.4 (L) 3.5 - 5.1 mEq/L  ? Chloride 100 96 - 112 mEq/L  ? CO2 39 (H) 19 - 32 mEq/L  ? Glucose, Bld 98 70 - 99 mg/dL  ? BUN 4 (L) 6 - 23  mg/dL  ? Creatinine, Ser 0.81 0.40 - 1.20 mg/dL  ? Total Bilirubin 0.3  0.2 - 1.2 mg/dL  ? Alkaline Phosphatase 64 39 - 117 U/L  ? AST 33 0 - 37 U/L  ? ALT 27 0 - 35 U/L  ? Total Protein 6.3 6.0 - 8.3 g/dL  ? Albumin 3.7 3.5 - 5.2 g/dL  ? GFR 82.07 >60.00 mL/min  ? Calcium 8.9 8.4 - 10.5 mg/dL  ?Magnesium  ?Result Value Ref Range  ? Magnesium 1.8 1.5 - 2.5 mg/dL  ? ? ?Assessment & Plan:  ?This visit occurred during the SARS-CoV-2 public health emergency.  Safety protocols were in place, including screening questions prior to the visit, additional usage of staff PPE, and extensive cleaning of exam room while observing appropriate contact time as indicated for disinfecting solutions.  ? ?Problem List Items Addressed This Visit   ? ? Fibromyalgia  ?  This could contribute to mental fogginess  ?  ?  ? Gastroparesis  ?  H/o this, sees [facility] ?  ?  ? Non-intractable vomiting with nausea  ?  Ongoing GI symptoms since norovirus infection last month with residual cramping, gassiness, bloating. ?Continue regular carafate and omeprazole, and prn zofran. Consider adding pepcid. Consider return to GI specialist.  ?Will need to verify if she's taking probiotic.  ?  ?  ? Relevant Orders  ? Basic metabolic panel  ? Acute gastroenteropathy due to Norovirus  ?  Ongoing GI symptoms after Norovirus illness last month. If ongoing, may need to return to GI (Dr Earnie Larsson at [facility]).  ?  ?  ? Low serum cortisol level - Primary  ?  Cortisol levels were low during recent hospitalization during acutely stressful period - possibly had relative adrenal insufficiency. However levels were checked at 3pm when levels naturally run lower. However with concomitant symptoms of low BP, fatigue, GI symptoms, reasonable to recheck 8am cortisol level to r/o adrenal insufficiency. Discussed if levels return indeterminate range, will need to see Dr Gabriel Carina for further evaluation.  ?Reassured normal appearing adrenal glands on CT from last month.  ?  ?  ? Relevant  Orders  ? Cortisol-am, blood  ?  ? ?No orders of the defined types were placed in this encounter. ? ?Orders Placed This Encounter  ?Procedures  ? Cortisol-am, blood  ?  Standing Status:   Future  ?  Standing Expiration

## 2021-03-15 NOTE — Assessment & Plan Note (Signed)
H/o this, sees [facility] ?

## 2021-03-15 NOTE — Assessment & Plan Note (Addendum)
Cortisol levels were low during recent hospitalization during acutely stressful period - possibly had relative adrenal insufficiency. However levels were checked at 3pm when levels naturally run lower. However with concomitant symptoms of low BP, fatigue, GI symptoms, reasonable to recheck 8am cortisol level to r/o adrenal insufficiency. Discussed if levels return indeterminate range, will need to see Dr Gabriel Carina for further evaluation.  ?Reassured normal appearing adrenal glands on CT from last month.  ?

## 2021-03-16 ENCOUNTER — Other Ambulatory Visit (INDEPENDENT_AMBULATORY_CARE_PROVIDER_SITE_OTHER): Payer: Medicare Other

## 2021-03-16 DIAGNOSIS — R112 Nausea with vomiting, unspecified: Secondary | ICD-10-CM | POA: Diagnosis not present

## 2021-03-16 LAB — BASIC METABOLIC PANEL
BUN: 10 mg/dL (ref 6–23)
CO2: 33 mEq/L — ABNORMAL HIGH (ref 19–32)
Calcium: 9.8 mg/dL (ref 8.4–10.5)
Chloride: 100 mEq/L (ref 96–112)
Creatinine, Ser: 0.88 mg/dL (ref 0.40–1.20)
GFR: 74.27 mL/min (ref >60.00)
Glucose, Bld: 95 mg/dL (ref 70–99)
Potassium: 3.3 mEq/L — ABNORMAL LOW (ref 3.5–5.1)
Sodium: 141 mEq/L (ref 135–145)

## 2021-03-17 ENCOUNTER — Other Ambulatory Visit: Payer: Self-pay | Admitting: Family Medicine

## 2021-03-17 LAB — CORTISOL-AM, BLOOD: Cortisol - AM: 22.7 ug/dL — ABNORMAL HIGH

## 2021-03-17 NOTE — Telephone Encounter (Signed)
Name of Medication: Lorazepam ?Name of Pharmacy: [facility] ?Last Fill or Written Date and Quantity: 01/23/21, #60 ?Last Office Visit and Type: 03/15/21, GI issues ?Next Office Visit and Type: none ?Last Controlled Substance Agreement Date: 06/10/13 ?Last UDS: 06/10/13 ? ? ?

## 2021-03-18 ENCOUNTER — Encounter: Payer: Self-pay | Admitting: Family Medicine

## 2021-03-18 ENCOUNTER — Other Ambulatory Visit: Payer: Self-pay | Admitting: Family Medicine

## 2021-03-18 DIAGNOSIS — R7989 Other specified abnormal findings of blood chemistry: Secondary | ICD-10-CM

## 2021-03-18 MED ORDER — POTASSIUM 99 MG PO TABS: 1.0000 | ORAL_TABLET | Freq: Every day | ORAL | 0 refills | Status: DC

## 2021-03-18 NOTE — Telephone Encounter (Signed)
Seen this past week ?

## 2021-03-20 MED ORDER — LORAZEPAM 1 MG PO TABS: 0.5000 mg | ORAL_TABLET | Freq: Two times a day (BID) | ORAL | 0 refills | Status: DC | PRN

## 2021-03-20 NOTE — Telephone Encounter (Signed)
ERx 

## 2021-03-24 NOTE — Telephone Encounter (Signed)
I've asked pt to collect urine sample for urinary urgency/frequency endorsed.  ?

## 2021-03-30 NOTE — Telephone Encounter (Signed)
Left message on vm per dpr notifying her Dr. Darnell Level has asked pt to come by the office to leave a urine sample.  ?

## 2021-04-03 NOTE — Telephone Encounter (Signed)
Left message on vm per dpr notifying her Dr. Darnell Level has asked pt to come by the office to leave a urine sample.  ?

## 2021-04-05 NOTE — Telephone Encounter (Signed)
Left message on vm per dpr notifying her Dr. Darnell Level has asked pt to come by the office to leave a urine sample.  ? ?Also, sent MyChart message.  ?

## 2021-04-22 ENCOUNTER — Emergency Department: Payer: Medicare Other

## 2021-04-22 ENCOUNTER — Emergency Department
Admission: EM | Admit: 2021-04-22 | Discharge: 2021-04-22 | Disposition: A | Discharge status: Home / Self Care | Payer: Medicare Other | Attending: Emergency Medicine | Admitting: Emergency Medicine

## 2021-04-22 ENCOUNTER — Other Ambulatory Visit: Payer: Self-pay

## 2021-04-22 DIAGNOSIS — J449 Chronic obstructive pulmonary disease, unspecified: Secondary | ICD-10-CM | POA: Diagnosis not present

## 2021-04-22 DIAGNOSIS — R509 Fever, unspecified: Secondary | ICD-10-CM | POA: Diagnosis present

## 2021-04-22 DIAGNOSIS — D72819 Decreased white blood cell count, unspecified: Secondary | ICD-10-CM | POA: Diagnosis not present

## 2021-04-22 DIAGNOSIS — E039 Hypothyroidism, unspecified: Secondary | ICD-10-CM | POA: Diagnosis not present

## 2021-04-22 DIAGNOSIS — R000 Tachycardia, unspecified: Secondary | ICD-10-CM | POA: Insufficient documentation

## 2021-04-22 DIAGNOSIS — J101 Influenza due to other identified influenza virus with other respiratory manifestations: Secondary | ICD-10-CM | POA: Diagnosis not present

## 2021-04-22 DIAGNOSIS — Z20822 Contact with and (suspected) exposure to covid-19: Secondary | ICD-10-CM | POA: Insufficient documentation

## 2021-04-22 LAB — BASIC METABOLIC PANEL
Anion gap: 9 (ref 5–15)
BUN: 9 mg/dL (ref 6–20)
CO2: 30 mmol/L (ref 22–32)
Calcium: 8.9 mg/dL (ref 8.9–10.3)
Chloride: 98 mmol/L (ref 98–111)
Creatinine, Ser: 1.07 mg/dL — ABNORMAL HIGH (ref 0.44–1.00)
GFR, Estimated: >60 mL/min (ref >60)
Glucose, Bld: 105 mg/dL — ABNORMAL HIGH (ref 70–99)
Potassium: 3.4 mmol/L — ABNORMAL LOW (ref 3.5–5.1)
Sodium: 137 mmol/L (ref 135–145)

## 2021-04-22 LAB — RESP PANEL BY RT-PCR (FLU A&B, COVID) ARPGX2
Influenza A by PCR: NEGATIVE
Influenza B by PCR: POSITIVE — AB
SARS Coronavirus 2 by RT PCR: NEGATIVE

## 2021-04-22 LAB — CBC
HCT: 37.3 % (ref 36.0–46.0)
Hemoglobin: 12.1 g/dL (ref 12.0–15.0)
MCH: 28.3 pg (ref 26.0–34.0)
MCHC: 32.4 g/dL (ref 30.0–36.0)
MCV: 87.1 fL (ref 80.0–100.0)
Platelets: 212 10*3/uL (ref 150–400)
RBC: 4.28 MIL/uL (ref 3.87–5.11)
RDW: 12.5 % (ref 11.5–15.5)
WBC: 3.7 10*3/uL — ABNORMAL LOW (ref 4.0–10.5)
nRBC: 0 % (ref 0.0–0.2)

## 2021-04-22 LAB — URINALYSIS, ROUTINE W REFLEX MICROSCOPIC
Bilirubin Urine: NEGATIVE
Glucose, UA: NEGATIVE mg/dL
Hgb urine dipstick: NEGATIVE
Ketones, ur: NEGATIVE mg/dL
Leukocytes,Ua: NEGATIVE
Nitrite: NEGATIVE
Protein, ur: NEGATIVE mg/dL
Specific Gravity, Urine: 1.011 (ref 1.005–1.030)
pH: 5 (ref 5.0–8.0)

## 2021-04-22 MED ORDER — SODIUM CHLORIDE 0.9 % IV BOLUS
1000.0000 mL | Freq: Once | INTRAVENOUS | Status: AC
Start: 2021-04-22 — End: 2021-04-22
  Administered 2021-04-22: 1000 mL via INTRAVENOUS

## 2021-04-22 NOTE — ED Triage Notes (Addendum)
Pt comes with c/o weakness, fever, vomiting and belly pain. Pt states this started yesterday. Pt states pain in lower right side. ? ?Pt denies any urinary symptoms. ? ?Pt states [facility] tested for flu covid and all were neg. ? ?Pt states fever of 104 once she woke up from her nap. Pt denies taking any medication for it. ?

## 2021-04-22 NOTE — Discharge Instructions (Signed)
Please seek medical attention for any high fevers, chest pain, shortness of breath, change in behavior, persistent vomiting, bloody stool or any other new or concerning symptoms.  

## 2021-04-22 NOTE — ED Provider Notes (Signed)
? ?[facility] ?Provider Note ? ? ? Event Date/Time  ? First MD Initiated Contact with Patient 04/22/21 1823   ?  (approximate) ? ? ?History  ? ?Weakness ? ? ?HPI ? ?Karen Aguilar is a 56 y.o. female  who, per endocrinology note dated three days ago has history of COPD, hypothyrodism, IBS, who presents to the emergency department today because of concern for fevers, fast heart rate, nausea, cough and not feeling well.  Patient states that she started feeling bad yesterday.  She initially thought it might be allergies so tried taking his Zyrtec without any relief.  This morning she went to urgent care.  She had developed fevers.  At urgent care she states they tested her urine and nasal swab and she was negative for UTI, COVID and flu.  The patient went home but then continued to have fevers and weakness. ? ?Physical Exam  ? ?Triage Vital Signs: ?ED Triage Vitals  ?Enc Vitals Group  ?   BP 04/22/21 1806 108/69  ?   Pulse Rate 04/22/21 1806 (!) 123  ?   Resp 04/22/21 1806 18  ?   Temp 04/22/21 1806 99.7 ?F (37.6 ?C)  ?   Temp Source 04/22/21 1806 Oral  ?   SpO2 04/22/21 1806 100 %  ?   Weight --   ?   Height --   ?   Head Circumference --   ?   Peak Flow --   ?   Pain Score 04/22/21 1805 7  ? ?Most recent vital signs: ?Vitals:  ? 04/22/21 1806  ?BP: 108/69  ?Pulse: (!) 123  ?Resp: 18  ?Temp: 99.7 ?F (37.6 ?C)  ?SpO2: 100%  ? ?General: Awake, alert and oriented. ?CV:  Good peripheral perfusion. Tachycardia, regular rhythm. ?Resp:  Normal effort. Lungs clear. ?Abd:  No distention. Minimal lower abdominal tenderness. ? ?ED Results / Procedures / Treatments  ? ?Labs ?(all labs ordered are listed, but only abnormal results are displayed) ?Labs Reviewed  ?RESP PANEL BY RT-PCR (FLU A&B, COVID) ARPGX2 - Abnormal; Notable for the following components:  ?    Result Value  ? Influenza B by PCR POSITIVE (*)   ? All other components within normal limits  ?BASIC METABOLIC PANEL - Abnormal; Notable for the  following components:  ? Potassium 3.4 (*)   ? Glucose, Bld 105 (*)   ? Creatinine, Ser 1.07 (*)   ? All other components within normal limits  ?CBC - Abnormal; Notable for the following components:  ? WBC 3.7 (*)   ? All other components within normal limits  ?URINALYSIS, ROUTINE W REFLEX MICROSCOPIC - Abnormal; Notable for the following components:  ? Color, Urine YELLOW (*)   ? APPearance HAZY (*)   ? All other components within normal limits  ?CBG MONITORING, ED  ? ? ? ?EKG ? ?INance Pear, attending physician, personally viewed and interpreted this EKG ? ?EKG Time: 1810 ?Rate: 118 ?Rhythm: sinus tachycardia ?Axis: normal ?Intervals: qtc 432 ?QRS: narrow ?ST changes: no st elevation ?Impression: abnormal ekg ? ? ?RADIOLOGY ?I independently interpreted and visualized the CXR. My interpretation: No pneumonia, no pneumothorax. ?Radiology interpretation:  ?IMPRESSION:  ?No evidence of acute cardiopulmonary disease.  ? ? ?PROCEDURES: ? ?Critical Care performed: No ? ?Procedures ? ? ?MEDICATIONS ORDERED IN ED: ?Medications - No data to display ? ? ?IMPRESSION / MDM / ASSESSMENT AND PLAN / ED COURSE  ?I reviewed the triage vital signs and the nursing notes. ?             ?               ? ?  Differential diagnosis includes, but is not limited to, covid, influenza, pneumonia, UA. ? ?Patient presented to the emergency department today with myriad medical complaints.  Patient tested positive for influenza B here.  Chest x-ray without any pneumonia.  Blood work without any concerning electrolyte abnormalities or concerning leukocytosis.  Patient was given IV fluids here in the emergency department.  I did discuss Tamiflu with the patient.  At this time she has chosen to defer which I think is reasonable.  She states she has nausea medication at home.  Discussed Tylenol and ibuprofen for fevers and muscle aches. ? ?FINAL CLINICAL IMPRESSION(S) / ED DIAGNOSES  ? ?Final diagnoses:  ?Influenza B  ? ? ?Note:  This document  was prepared using Dragon voice recognition software and may include unintentional dictation errors. ? ?  ?Nance Pear, MD ?04/22/21 2105 ? ?

## 2021-04-22 NOTE — ED Notes (Signed)
Pt to ED for weakness, fever, and N/V that started yesterday. Pt states she had a fever of 104 at home. Pt also having abdominal pain, denies diarrhea. Denies urinary symptoms. States she was recently admitted for sepsis in February; pt expresses she is worried she still may have an infection.  ? ?Pt is A&OX4.  ?

## 2021-05-04 ENCOUNTER — Other Ambulatory Visit: Payer: Self-pay | Admitting: Family Medicine

## 2021-05-04 NOTE — Telephone Encounter (Signed)
Name of Medication: Lorazepam 1 mg ?Name of Pharmacy: [facility] ?Last Fill or Written Date and Quantity: # 60 on 03/20/2021 ?Last Office Visit and Type:03/15/2021 acute and 12/02/2020 FU  ?Next Office Visit and Type: none scheduled ?Last Controlled Substance Agreement Date: none seen ?Last XYO:FVWA seen ? ? ?

## 2021-05-05 MED ORDER — LORAZEPAM 1 MG PO TABS: 0.5000 mg | ORAL_TABLET | Freq: Two times a day (BID) | ORAL | 0 refills | Status: DC | PRN

## 2021-05-05 NOTE — Telephone Encounter (Signed)
ERx 

## 2021-06-02 ENCOUNTER — Ambulatory Visit (INDEPENDENT_AMBULATORY_CARE_PROVIDER_SITE_OTHER): Payer: Medicare Other

## 2021-06-02 VITALS — Ht 68.0 in | Wt 167.0 lb

## 2021-06-02 DIAGNOSIS — Z0000 Encounter for general adult medical examination without abnormal findings: Secondary | ICD-10-CM | POA: Diagnosis not present

## 2021-06-02 NOTE — Progress Notes (Signed)
I connected with Karen Aguilar today by telephone and verified that I am speaking with the correct person using two identifiers. Location patient: home Location provider: work Persons participating in the virtual visit: Stefan, Karen LPN.   I discussed the limitations, risks, security and privacy concerns of performing an evaluation and management service by telephone and the availability of in person appointments. I also discussed with the patient that there may be a patient responsible charge related to this service. The patient expressed understanding and verbally consented to this telephonic visit.    Interactive audio and video telecommunications were attempted between this provider and patient, however failed, due to patient having technical difficulties OR patient did not have access to video capability.  We continued and completed visit with audio only.     Vital signs may be patient reported or missing.  Subjective:   Karen Aguilar is a 56 y.o. female who presents for Medicare Annual (Subsequent) preventive examination.  Review of Systems     Cardiac Risk Factors include: none     Objective:    Today's Vitals   06/02/21 1411 06/02/21 1412  Weight: 167 lb (75.8 kg)   Height: '5\' 8"'$  (1.727 m)   PainSc:  6    Body mass index is 25.39 kg/m.     06/02/2021    2:21 PM 04/22/2021    6:06 PM 02/20/2021    3:04 PM 07/20/2020   12:03 PM 09/02/2019   10:22 AM 07/04/2019   10:24 AM 04/25/2019   10:53 AM  Advanced Directives  Does Patient Have a Medical Advance Directive? Yes No Yes No Yes Yes Yes  Type of Paramedic of [place];Living will  Living will  [place];Living will Living will;Healthcare Power of [place];Living will  Does patient want to make changes to medical advance directive?     No - Patient declined  No - Patient declined  Copy of [place] in Chart? Yes -  validated most recent copy scanned in chart (See row information)    Yes - validated most recent copy scanned in chart (See row information)  Yes - validated most recent copy scanned in chart (See row information)  Would patient like information on creating a medical advance directive?   No - Patient declined No - Patient declined  No - Patient declined No - Patient declined    Current Medications (verified) Outpatient Encounter Medications as of 06/02/2021  Medication Sig   Ascorbic Acid (VITAMIN C PO) Take 1 tablet by mouth as needed (for immune system support).    baclofen (LIORESAL) 10 MG tablet Take 10 mg by mouth 3 (three) times daily as needed. Takes 0.5 tablets 3 times a day.   calcium carbonate (TUMS EX) 750 MG chewable tablet Chew 2-3 tablets by mouth 2 (two) times daily.   camphor-menthol (SARNA) lotion Apply 1 application topically at bedtime as needed for itching.    Cholecalciferol (VITAMIN D) 2000 units CAPS Take 1 capsule (2,000 Units total) by mouth daily.   diclofenac sodium (VOLTAREN) 1 % GEL APPLY THIN LAYER EXTERNALLY TO THE AFFECTED AREA UP TO FOUR TIMES DAILY AS NEEDED   docusate sodium (COLACE) 100 MG capsule Take 100 mg by mouth 2 (two) times daily. As needed   LORazepam (ATIVAN) 1 MG tablet Take 0.5-1 tablets (0.5-1 mg total) by mouth 2 (two) times daily as needed for anxiety.   NUCYNTA ER 200 MG TB12 Take  1 tablet by mouth 2 (two) times daily. Takes 250 mg twice daily   omeprazole (PRILOSEC) 40 MG capsule Take 40 mg by mouth 2 (two) times daily.   ondansetron (ZOFRAN-ODT) 4 MG disintegrating tablet Take 1 tablet (4 mg total) by mouth every 8 (eight) hours as needed for nausea or vomiting.   pediatric multivitamin-iron (POLY-VI-SOL WITH IRON) 15 MG chewable tablet Chew 1 tablet by mouth daily.   Potassium 99 MG TABS Take 1 tablet (99 mg total) by mouth daily.   thyroid (ARMOUR) 90 MG tablet Take 1 tablet (90 mg total) by mouth daily. One day a week take 1/2 tablet    sucralfate (CARAFATE) 1 g tablet Take 1 g by mouth 4 (four) times daily. (Patient not taking: Reported on 06/02/2021)   No facility-administered encounter medications on file as of 06/02/2021.    Allergies (verified) Augmentin [amoxicillin-pot clavulanate], Fluconazole, Erythromycin, Influenza vaccines, Levothyroxine, Thimerosal, Tomato, Adhesive [tape], Azithromycin, Gabapentin, Imitrex [sumatriptan], Lyrica [pregabalin], Macrodantin [nitrofurantoin], Macrolides and ketolides, Nickel, Sulfa antibiotics, Sulfasalazine, Troleandomycin, and Vancomycin   History: Past Medical History:  Diagnosis Date   Abnormal MRI scan, bone 2012   abnormal marrow signal humeral head, glenoid and scapula - diffuse replacement of fatty marrow - likely benign, no need for further investigation ([place])   Agnosia for temperature    Right Leg   Anemia    etiology unknown-iron infusion in past,last 9/12   Anxiety    Anxiety and depression    Arachnoiditis    S1 nerve root, 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 Eartha Inch Pain   COVID-19 virus infection 09/2018   DDD (degenerative disc disease), cervical    DDD (degenerative disc disease), lumbar    s/p permanent nerve damage after back surgery   Depression    Depression with anxiety    Diverticulosis    Eczema    Family history of adverse reaction to anesthesia    Mother - nausea   Fibromyalgia    Foot drop    right - numbness, tingling - twisting   Gallstones    GERD (gastroesophageal reflux disease)    rare zantac PRN N/V at times with this   Headache(784.0)    migraines   Hemorrhoid    Hemorrhoids    History of kidney stones    Hyperlipidemia    Hypothyroidism 1990s   hashimoto's thyroiditis   IBS (irritable bowel syndrome)    IBS (irritable bowel syndrome)    Incontinence of urine    s/p sling procedure-unresolved   Iron deficiency    h/o anemia, s/p iron infusion (2012)   Liver hemangioma     x2   Memory  change    due to medications   Migraines    and h/o optical migraine   Migraines    Mixed urge and stress incontinence 2008   extensive workup including urodynamics, failed miltiple anticholinergics including myrbetriq and urogesic blue s/p PTNS, normal cystoscopy 2014, seen by Dr Matilde Sprang and Dr Erlene Quan   Neurogenic bladder 2008   after 13 spine surgeries with periph neuropathy and chronic LBP   Neuropathy of lower extremity 2011   Nocturia    Notalgia paresthetica    Osteoarthritis 12/2013   PONV (postoperative nausea and vomiting)    Pulmonary nodule 04/2012   7.36m RLL nodule - resolved on imaging 12/2012   Sleep apnea    mild-doesn't use cpap   Urine frequency    Vitamin  D deficiency    Past Surgical History:  Procedure Laterality Date   ANTERIOR CERVICAL DECOMP/DISCECTOMY FUSION     C3, 4, 5 - improved after surgery   ANTERIOR CERVICAL DECOMP/DISCECTOMY FUSION  07/2017   C6/7 Dr Pamala Hurry   ARTERY BIOPSY Left 09/02/2019   Procedure: BIOPSY TEMPORAL ARTERY;  Surgeon: [name], MD;  Location: [facility] ORS;  Service: General;  Laterality: Left;   BACK SURGERY     multiple-L4-s1,hardware,fusion,removal,infection s/p 5/11 procedure   BACK SURGERY  02/16/2011   "my 7th back OR; Procedure: Exploration of fusion removal of hardware L4-S1, harvesting of right iliac crest bone graft, redo posterior lateral fusion L4-5 using iliac crest bone graft BMP and master graft replacement of bilateral L4 screws with a Telfa tech 7.5 x 40 mm screw on the right and 8.5 x 40 mm screw on the left placement of a large Hemovac drain   [place]; 2001   x 2   CHOLECYSTECTOMY  2001   COLONOSCOPY  10/2016   rpt 10 yrs Gustavo Lah)   COLONOSCOPY  03/2019   1 polyp, biopsies taken ([place])   ESOPHAGEAL MANOMETRY  06/2017   WNL (Dr Earnie Larsson)   ESOPHAGOGASTRODUODENOSCOPY  03/2013   early esophageal stricture dilated, o/w WNL Deatra Ina)   ESOPHAGOGASTRODUODENOSCOPY  12/2014   WNL, s/p  empiric dilation of esophagus Henrene Pastor)   ESOPHAGOGASTRODUODENOSCOPY  10/2015   WNL, biopsies WNL as well (minimal esophagitis with reactive changes) [place])   ESOPHAGOGASTRODUODENOSCOPY  10/2016   biopsy suggestive of ulcer ([place])   ESOPHAGOGASTRODUODENOSCOPY  03/2019   mild portal hypertensive gastropathy, gastric erosion without bleeding, duodenal diverticulum ([place])   ESOPHAGOGASTRODUODENOSCOPY  09/2020   normal esophagus with mild edema, no gastritis, H pylori neg (Dr Redmond Pulling at [place])   ESOPHAGOGASTRODUODENOSCOPY  08/31/2020   biopsy with reactive gastropathy ([facility])   HEMORRHOID BANDING     LASIK Bilateral    LUMBAR WOUND DEBRIDEMENT  03/15/2011   Procedure: LUMBAR WOUND DEBRIDEMENT;  Surgeon: Elaina Hoops, MD;  Lumbar Wound Debridement   MANDIBLE RECONSTRUCTION  1986   PFT  01/28/2013   WNL   PUBOVAGINAL SLING  2008   midurethral sling   SHOULDER CLOSED REDUCTION Left 12/18/2010   Procedure: CLOSED MANIPULATION SHOULDER;  Surgeon: Johnn Hai; labrial debridement   SPINAL CORD STIMULATOR INSERTION N/A 08/19/2015   Procedure: LUMBAR SPINAL CORD STIMULATOR INSERTION;  Surgeon: Erline Levine, MD;  Location: [facility] [facility];  Service: Neurosurgery;  Laterality: N/A;  LUMBAR SPINAL CORD STIMULATOR INSERTION   [place]   as a child; "then they grew back"   Family History  Problem Relation Age of Onset   Lung cancer Father 60        (smoker)   Diabetes Brother    Thyroid disease Mother        ?empty sella   Irritable bowel syndrome Mother    Dysphagia Mother    CAD Maternal Grandfather        MI   Heart disease Maternal Grandfather    Heart attack Maternal Grandfather    Lung cancer Paternal Grandfather         (smoker)   Colon cancer Paternal Grandmother    Thyroid disease Cousin    Cancer Other        unsure (ovarian/uterine)   Breast cancer Maternal Aunt    Alzheimer's disease Maternal Grandmother    Diabetes Daughter    Social  History  Socioeconomic History   Marital status: Married    Spouse name: Not on file   Number of children: 2   Years of education: Not on file   Highest education level: Not on file  Occupational History   Occupation: disabled  Tobacco Use   Smoking status: Never   Smokeless tobacco: Never  Vaping Use   Vaping Use: Never used  Substance and Sexual Activity   Alcohol use: Yes    Alcohol/week: 0.0 standard drinks    Comment: rarely drinks wine   Drug use: No   Sexual activity: Not Currently  Other Topics Concern   Not on file  Social History Narrative   Lives with husband and 2 daughters)   Daughter of Lurena Joiner   Occupation: was Public affairs consultant   Disability since 2011 - arachnoiditis   Edu: associate's degree   Activity: severe limitations 2/2 arachnoiditis and chronic pain, walks to end of driveway and back   Diet: good water, fruits/vegetables daily   Social Determinants of Radio broadcast assistant Strain: Low Risk    Difficulty of Paying Living Expenses: Not hard at all  Food Insecurity: No Food Insecurity   Worried About Charity fundraiser in the Last Year: Never true   Arboriculturist in the Last Year: Never true  Transportation Needs: No Transportation Needs   Lack of Transportation (Medical): No   Lack of Transportation (Non-Medical): No  Physical Activity: Inactive   Days of Exercise per Week: 0 days   Minutes of Exercise per Session: 0 min  Stress: Stress Concern Present   Feeling of Stress : To some extent  Social Connections: Not on file    Tobacco Counseling Counseling given: Not Answered   Clinical Intake:  Pre-visit preparation completed: Yes  Pain : 0-10 Pain Score: 6  Pain Type: Chronic pain Pain Location: Generalized Pain Descriptors / Indicators: Dull, Sharp Pain Onset: More than a month ago Pain Frequency: Constant     Nutritional Status: BMI 25 -29 Overweight Nutritional Risks: Nausea/ vomitting/ diarrhea  (nausea sometimes) Diabetes: No  How often do you need to have someone help you when you read instructions, pamphlets, or other written materials from your doctor or pharmacy?: 1 - Never What is the last grade level you completed in school?: 61yrdegree  Diabetic? no  Interpreter Needed?: No  Information entered by :: NAllen LPN   Activities of Daily Living    06/02/2021    2:25 PM 05/31/2021    5:53 PM  In your present state of health, do you have any difficulty performing the following activities:  Hearing? 1 0  Vision? 1 1  Difficulty concentrating or making decisions? 1 1  Walking or climbing stairs? 1 1  Dressing or bathing? 1 1  Doing errands, shopping? 1 1  Preparing Food and eating ? Y Y  Using the Toilet? N N  In the past six months, have you accidently leaked urine? Y Y  Do you have problems with loss of bowel control? Y N  Comment trouble getting to toilet fast enough sometimes   Managing your Medications? Y N  Comment husband picks up   Managing your Finances? N N  Housekeeping or managing your Housekeeping? YTempie Donning   Patient Care Team: GRia Bush MD as PCP - General (Family Medicine) SErline Levine MD as Consulting Physician (Neurosurgery) CKary Kos MD as Consulting Physician (Neurosurgery) VHarold Hedge RDarrick Grinder MD as Consulting Physician (Allergy and  Immunology) Gabriel Carina Betsey Holiday, MD as Physician Assistant (Internal Medicine) Nicholaus Bloom, MD as Consulting Physician (Anesthesiology)  Indicate any recent Medical Services you may have received from other than [facility] providers in the past year (date may be approximate).     Assessment:   This is a routine wellness examination for Ishi.  Hearing/Vision screen Vision Screening - Comments:: Regular eye exams, [place], [facility]  Dietary issues and exercise activities discussed: Current Exercise Habits: The patient does not participate in regular exercise at present   Goals Addressed              This Visit's Progress    Patient Stated       06/02/2021, wants to strengthen legs       Depression Screen    06/02/2021    2:23 PM 06/01/2020    4:40 PM 10/12/2016   11:03 AM 10/05/2015   10:45 AM 07/15/2014   11:26 AM  PHQ 2/9 Scores  PHQ - 2 Score '3 4 5 4 4  '$ PHQ- 9 Score '16 14 17 21 12    '$ Fall Risk    06/02/2021    2:22 PM 05/31/2021    5:53 PM 06/01/2020    3:22 PM 01/18/2017    2:04 PM 10/12/2016   11:03 AM  Fall Risk   Falls in the past year? 0 0 0 Yes Yes  Number falls in past yr: 0   1 2 or more  Injury with Fall? 0    No  Risk Factor Category      High Fall Risk  Risk for fall due to : Impaired balance/gait;Impaired mobility;Medication side effect    Impaired balance/gait;Impaired mobility;History of fall(s)  Follow up Falls evaluation completed;Education provided;Falls prevention discussed        FALL RISK PREVENTION PERTAINING TO THE HOME:  Any stairs in or around the home? Yes  If so, are there any without handrails? Yes  Home free of loose throw rugs in walkways, pet beds, electrical cords, etc? Yes  Adequate lighting in your home to reduce risk of falls? Yes   ASSISTIVE DEVICES UTILIZED TO PREVENT FALLS:  Life alert? No  Use of a cane, walker or w/c? Yes  Grab bars in the bathroom? No  Shower chair or bench in shower? No  Elevated toilet seat or a handicapped toilet? No   TIMED UP AND GO:  Was the test performed? No .      Cognitive Function:    10/12/2016   11:00 AM 10/05/2015   11:00 AM  MMSE - Mini Mental State Exam  Orientation to time 5 5  Orientation to Place 5 5  Registration 3 3  Attention/ Calculation 0 0  Recall 1 3  Recall-comments unable to recall 2 of 3 words   Language- name 2 objects 0 0  Language- repeat 1 1  Language- follow 3 step command 3 3  Language- read & follow direction 0 0  Write a sentence 0 0  Copy design 0 0  Total score 18 20        06/02/2021    2:28 PM  6CIT Screen  What Year? 0 points   What month? 0 points  What time? 0 points  Count back from 20 0 points  Months in reverse 0 points  Repeat phrase 4 points  Total Score 4 points    Immunizations  There is no immunization history on file for this patient.  TDAP status: Due,  Education has been provided regarding the importance of this vaccine. Advised may receive this vaccine at local pharmacy or Health Dept. Aware to provide a copy of the vaccination record if obtained from local pharmacy or Health Dept. Verbalized acceptance and understanding.  Flu Vaccine status: Declined, Education has been provided regarding the importance of this vaccine but patient still declined. Advised may receive this vaccine at local pharmacy or Health Dept. Aware to provide a copy of the vaccination record if obtained from local pharmacy or Health Dept. Verbalized acceptance and understanding.  Pneumococcal vaccine status: Declined,  Education has been provided regarding the importance of this vaccine but patient still declined. Advised may receive this vaccine at local pharmacy or Health Dept. Aware to provide a copy of the vaccination record if obtained from local pharmacy or Health Dept. Verbalized acceptance and understanding.   Covid-19 vaccine status: Declined, Education has been provided regarding the importance of this vaccine but patient still declined. Advised may receive this vaccine at local pharmacy or Health Dept.or vaccine clinic. Aware to provide a copy of the vaccination record if obtained from local pharmacy or Health Dept. Verbalized acceptance and understanding.  Qualifies for Shingles Vaccine? Yes   Zostavax completed No   Shingrix Completed?: No.    Education has been provided regarding the importance of this vaccine. Patient has been advised to call insurance company to determine out of pocket expense if they have not yet received this vaccine. Advised may also receive vaccine at local pharmacy or Health Dept. Verbalized  acceptance and understanding.  Screening Tests Health Maintenance  Topic Date Due   Zoster Vaccines- Shingrix (1 of 2) 09/02/2021 (Originally 05/01/2016)   TETANUS/TDAP  10/13/2026 (Originally 05/01/1985)   PAP SMEAR-Modifier  08/17/2021   MAMMOGRAM  09/13/2022   COLONOSCOPY (Pts 45-75yr Insurance coverage will need to be confirmed)  03/09/2029   Hepatitis C Screening  Completed   HIV Screening  Completed   HPV VACCINES  Aged Out    Health Maintenance  There are no preventive care reminders to display for this patient.   Colorectal cancer screening: Type of screening: Colonoscopy. Completed 03/10/2019. Repeat every 10 years  Mammogram status: Completed 09/12/2020. Repeat every year  Bone Density status: n/a  Lung Cancer Screening: (Low Dose CT Chest recommended if Age 55-80years, 30 pack-year currently smoking OR have quit w/in 15years.) does not qualify.   Lung Cancer Screening Referral: no  Additional Screening:  Hepatitis C Screening: does qualify; Completed 09/09/2019  Vision Screening: Recommended annual ophthalmology exams for early detection of glaucoma and other disorders of the eye. Is the patient up to date with their annual eye exam?  Yes  Who is the provider or what is the name of the office in which the patient attends annual eye exams? [facility] is not established with a provider, would they like to be referred to a provider to establish care? No .   Dental Screening: Recommended annual dental exams for proper oral hygiene  Community Resource Referral / Chronic Care Management: CRR required this visit?  No   CCM required this visit?  No      Plan:     I have personally reviewed and noted the following in the patient's chart:   Medical and social history Use of alcohol, tobacco or illicit drugs  Current medications and supplements including opioid prescriptions.  Functional ability and status Nutritional status Physical  activity Advanced directives List of other physicians Hospitalizations, surgeries,  and ER visits in previous 12 months Vitals Screenings to include cognitive, depression, and falls Referrals and appointments  In addition, I have reviewed and discussed with patient certain preventive protocols, quality metrics, and best practice recommendations. A written personalized care plan for preventive services as well as general preventive health recommendations were provided to patient.     Kellie Simmering, LPN   06/04/1495   Nurse Notes: none  Due to this being a virtual visit, the after visit summary with patients personalized plan was offered to patient via mail or my-chart.  Patient would like to access on my-chart

## 2021-06-02 NOTE — Patient Instructions (Signed)
Karen Aguilar , Thank you for taking time to come for your Medicare Wellness Visit. I appreciate your ongoing commitment to your health goals. Please review the following plan we discussed and let me know if I can assist you in the future.   Screening recommendations/referrals: Colonoscopy: completed 03/10/2019, due 03/09/2029 Mammogram: completed 09/12/2020, due 09/13/2021 Bone Density: n/a Recommended yearly ophthalmology/optometry visit for glaucoma screening and checkup Recommended yearly dental visit for hygiene and checkup  Vaccinations: Influenza vaccine: allergy Pneumococcal vaccine: n/a Tdap vaccine: decline Shingles vaccine: discussed  Covid-19: decline  Advanced directives: copy in chart  Conditions/risks identified: none  Next appointment: Follow up in one year for your annual wellness visit.   Preventive Care 40-64 Years, Female Preventive care refers to lifestyle choices and visits with your health care provider that can promote health and wellness. What does preventive care include? A yearly physical exam. This is also called an annual well check. Dental exams once or twice a year. Routine eye exams. Ask your health care provider how often you should have your eyes checked. Personal lifestyle choices, including: Daily care of your teeth and gums. Regular physical activity. Eating a healthy diet. Avoiding tobacco and drug use. Limiting alcohol use. Practicing safe sex. Taking low-dose aspirin daily starting at age 64. Taking vitamin and mineral supplements as recommended by your health care provider. What happens during an annual well check? The services and screenings done by your health care provider during your annual well check will depend on your age, overall health, lifestyle risk factors, and family history of disease. Counseling  Your health care provider may ask you questions about your: Alcohol use. Tobacco use. Drug use. Emotional well-being. Home and  relationship well-being. Sexual activity. Eating habits. Work and work Statistician. Method of birth control. Menstrual cycle. Pregnancy history. Screening  You may have the following tests or measurements: Height, weight, and BMI. Blood pressure. Lipid and cholesterol levels. These may be checked every 5 years, or more frequently if you are over 15 years old. Skin check. Lung cancer screening. You may have this screening every year starting at age 78 if you have a 30-pack-year history of smoking and currently smoke or have quit within the past 15 years. Fecal occult blood test (FOBT) of the stool. You may have this test every year starting at age 61. Flexible sigmoidoscopy or colonoscopy. You may have a sigmoidoscopy every 5 years or a colonoscopy every 10 years starting at age 68. Hepatitis C blood test. Hepatitis B blood test. Sexually transmitted disease (STD) testing. Diabetes screening. This is done by checking your blood sugar (glucose) after you have not eaten for a while (fasting). You may have this done every 1-3 years. Mammogram. This may be done every 1-2 years. Talk to your health care provider about when you should start having regular mammograms. This may depend on whether you have a family history of breast cancer. BRCA-related cancer screening. This may be done if you have a family history of breast, ovarian, tubal, or peritoneal cancers. Pelvic exam and Pap test. This may be done every 3 years starting at age 44. Starting at age 31, this may be done every 5 years if you have a Pap test in combination with an HPV test. Bone density scan. This is done to screen for osteoporosis. You may have this scan if you are at high risk for osteoporosis. Discuss your test results, treatment options, and if necessary, the need for more tests with your health care provider.  Vaccines  Your health care provider may recommend certain vaccines, such as: Influenza vaccine. This is recommended  every year. Tetanus, diphtheria, and acellular pertussis (Tdap, Td) vaccine. You may need a Td booster every 10 years. Zoster vaccine. You may need this after age 60. Pneumococcal 13-valent conjugate (PCV13) vaccine. You may need this if you have certain conditions and were not previously vaccinated. Pneumococcal polysaccharide (PPSV23) vaccine. You may need one or two doses if you smoke cigarettes or if you have certain conditions. Talk to your health care provider about which screenings and vaccines you need and how often you need them. This information is not intended to replace advice given to you by your health care provider. Make sure you discuss any questions you have with your health care provider. Document Released: 01/14/2015 Document Revised: 09/07/2015 Document Reviewed: 10/19/2014 Elsevier Interactive Patient Education  2017 [employer]    Fall Prevention in the Home Falls can cause injuries. They can happen to people of all ages. There are many things you can do to make your home safe and to help prevent falls. What can I do on the outside of my home? Regularly fix the edges of walkways and driveways and fix any cracks. Remove anything that might make you trip as you walk through a door, such as a raised step or threshold. Trim any bushes or trees on the path to your home. Use bright outdoor lighting. Clear any walking paths of anything that might make someone trip, such as rocks or tools. Regularly check to see if handrails are loose or broken. Make sure that both sides of any steps have handrails. Any raised decks and porches should have guardrails on the edges. Have any leaves, snow, or ice cleared regularly. Use sand or salt on walking paths during winter. Clean up any spills in your garage right away. This includes oil or grease spills. What can I do in the bathroom? Use night lights. Install grab bars by the toilet and in the tub and shower. Do not use towel bars as  grab bars. Use non-skid mats or decals in the tub or shower. If you need to sit down in the shower, use a plastic, non-slip stool. Keep the floor dry. Clean up any water that spills on the floor as soon as it happens. Remove soap buildup in the tub or shower regularly. Attach bath mats securely with double-sided non-slip rug tape. Do not have throw rugs and other things on the floor that can make you trip. What can I do in the bedroom? Use night lights. Make sure that you have a light by your bed that is easy to reach. Do not use any sheets or blankets that are too big for your bed. They should not hang down onto the floor. Have a firm chair that has side arms. You can use this for support while you get dressed. Do not have throw rugs and other things on the floor that can make you trip. What can I do in the kitchen? Clean up any spills right away. Avoid walking on wet floors. Keep items that you use a lot in easy-to-reach places. If you need to reach something above you, use a strong step stool that has a grab bar. Keep electrical cords out of the way. Do not use floor polish or wax that makes floors slippery. If you must use wax, use non-skid floor wax. Do not have throw rugs and other things on the floor that can make you   trip. What can I do with my stairs? Do not leave any items on the stairs. Make sure that there are handrails on both sides of the stairs and use them. Fix handrails that are broken or loose. Make sure that handrails are as long as the stairways. Check any carpeting to make sure that it is firmly attached to the stairs. Fix any carpet that is loose or worn. Avoid having throw rugs at the top or bottom of the stairs. If you do have throw rugs, attach them to the floor with carpet tape. Make sure that you have a light switch at the top of the stairs and the bottom of the stairs. If you do not have them, ask someone to add them for you. What else can I do to help prevent  falls? Wear shoes that: Do not have high heels. Have rubber bottoms. Are comfortable and fit you well. Are closed at the toe. Do not wear sandals. If you use a stepladder: Make sure that it is fully opened. Do not climb a closed stepladder. Make sure that both sides of the stepladder are locked into place. Ask someone to hold it for you, if possible. Clearly mark and make sure that you can see: Any grab bars or handrails. First and last steps. Where the edge of each step is. Use tools that help you move around (mobility aids) if they are needed. These include: Canes. Walkers. Scooters. Crutches. Turn on the lights when you go into a dark area. Replace any light bulbs as soon as they burn out. Set up your furniture so you have a clear path. Avoid moving your furniture around. If any of your floors are uneven, fix them. If there are any pets around you, be aware of where they are. Review your medicines with your doctor. Some medicines can make you feel dizzy. This can increase your chance of falling. Ask your doctor what other things that you can do to help prevent falls. This information is not intended to replace advice given to you by your health care provider. Make sure you discuss any questions you have with your health care provider. Document Released: 10/14/2008 Document Revised: 05/26/2015 Document Reviewed: 01/22/2014 Elsevier Interactive Patient Education  2017 [employer] Inc.  

## 2021-06-05 ENCOUNTER — Other Ambulatory Visit: Payer: Self-pay | Admitting: Otolaryngology

## 2021-06-05 DIAGNOSIS — R220 Localized swelling, mass and lump, head: Secondary | ICD-10-CM

## 2021-06-08 ENCOUNTER — Ambulatory Visit
Admission: RE | Admit: 2021-06-08 | Discharge: 2021-06-08 | Disposition: A | Discharge status: Home / Self Care | Payer: Medicare Other | Source: Ambulatory Visit | Attending: Otolaryngology | Admitting: Otolaryngology

## 2021-06-08 DIAGNOSIS — R220 Localized swelling, mass and lump, head: Secondary | ICD-10-CM

## 2021-06-08 MED ORDER — IOPAMIDOL (ISOVUE-300) INJECTION 61%
75.0000 mL | Freq: Once | INTRAVENOUS | Status: AC | PRN
  Administered 2021-06-08: 75 mL via INTRAVENOUS

## 2021-07-10 ENCOUNTER — Other Ambulatory Visit: Payer: Self-pay | Admitting: Family Medicine

## 2021-07-11 ENCOUNTER — Telehealth: Payer: Self-pay

## 2021-07-11 MED ORDER — LORAZEPAM 1 MG PO TABS: 0.5000 mg | ORAL_TABLET | Freq: Two times a day (BID) | ORAL | 0 refills | Status: DC | PRN

## 2021-07-11 NOTE — Telephone Encounter (Signed)
Noted  

## 2021-07-11 NOTE — Telephone Encounter (Signed)
Patient scheduled for 07/24/21 for CPE and on 07/20/21 for CPE labs

## 2021-07-11 NOTE — Telephone Encounter (Signed)
Name of Medication: Lorazepam Name of Pharmacy: [facility] or Written Date and Quantity: 05/05/21, #60 Last Office Visit and Type: 03/15/21, GI issues Next Office Visit and Type: NONE Last Controlled Substance Agreement Date: 06/10/13 Last UDS: 06/10/13

## 2021-07-11 NOTE — Telephone Encounter (Signed)
ERx 

## 2021-07-11 NOTE — Telephone Encounter (Signed)
Pt had AWV on 06/02/21.  Needs CPE (can schedule OV in a 30 min slot) and lab visits.

## 2021-07-11 NOTE — Telephone Encounter (Signed)
Left a vm for pt to call back to schedule cpe with labs prior.

## 2021-07-14 ENCOUNTER — Encounter: Payer: Self-pay | Admitting: Family Medicine

## 2021-07-14 ENCOUNTER — Ambulatory Visit: Payer: Medicare Other | Admitting: Family Medicine

## 2021-07-14 VITALS — BP 116/70 | HR 80 | Temp 96.4°F | Ht 68.0 in | Wt 169.0 lb

## 2021-07-14 DIAGNOSIS — G43109 Migraine with aura, not intractable, without status migrainosus: Secondary | ICD-10-CM

## 2021-07-14 MED ORDER — PROMETHAZINE HCL 25 MG/ML IJ SOLN
25.0000 mg | Freq: Once | INTRAMUSCULAR | Status: AC
  Administered 2021-07-14: 25 mg via INTRAMUSCULAR

## 2021-07-14 MED ORDER — NUCYNTA ER 200 MG PO TB12: ORAL_TABLET | ORAL | Status: DC

## 2021-07-14 MED ORDER — BACLOFEN 10 MG PO TABS: 5.0000 mg | ORAL_TABLET | Freq: Three times a day (TID) | ORAL | Status: DC | PRN

## 2021-07-14 MED ORDER — KETOROLAC TROMETHAMINE 30 MG/ML IJ SOLN
30.0000 mg | Freq: Once | INTRAMUSCULAR | Status: AC
  Administered 2021-07-14: 30 mg via INTRAMUSCULAR

## 2021-07-14 NOTE — Progress Notes (Unsigned)
Migraine going on for about 1 week.  Photophobia.  Phonophobia.  Migraines since she was a teenager.  Worse in the last few years.  Took zofran today for nausea.  Prev failed multiple preventive and abortive meds.    Ongoing trigeminal neuralgia per outside clinic.     She isn't taking carafate or prilosec in the last month or so.    Meds, vitals, and allergies reviewed.   ROS: Per HPI unless specifically indicated in ROS section   Toradol '30mg'$  IM Phenergan '25mg'$  IM.

## 2021-07-14 NOTE — Telephone Encounter (Signed)
Spoke with pt scheduling OV today at 3:30 with Dr. Damita Dunnings. Pt expresses her thanks.

## 2021-07-14 NOTE — Patient Instructions (Addendum)
Toradol '30mg'$ , Phenergan '25mg'$ .  Try to get some rest tonight.  Please check with your other docs about nurtec.   Take care.  Glad to see you.

## 2021-07-16 ENCOUNTER — Other Ambulatory Visit: Payer: Self-pay | Admitting: Family Medicine

## 2021-07-16 DIAGNOSIS — E7800 Pure hypercholesterolemia, unspecified: Secondary | ICD-10-CM

## 2021-07-16 DIAGNOSIS — E039 Hypothyroidism, unspecified: Secondary | ICD-10-CM

## 2021-07-16 DIAGNOSIS — E559 Vitamin D deficiency, unspecified: Secondary | ICD-10-CM

## 2021-07-16 DIAGNOSIS — E611 Iron deficiency: Secondary | ICD-10-CM

## 2021-07-16 NOTE — Assessment & Plan Note (Signed)
Given and tolerated Toradol '30mg'$ , Phenergan '25mg'$  at office visit. Rest and fluids.  Husband can drive her home. I asked her to check with her other docs about nurtec or other alternatives.

## 2021-07-20 ENCOUNTER — Other Ambulatory Visit (INDEPENDENT_AMBULATORY_CARE_PROVIDER_SITE_OTHER): Payer: Medicare Other

## 2021-07-20 DIAGNOSIS — E611 Iron deficiency: Secondary | ICD-10-CM

## 2021-07-20 DIAGNOSIS — E039 Hypothyroidism, unspecified: Secondary | ICD-10-CM | POA: Diagnosis not present

## 2021-07-20 DIAGNOSIS — E559 Vitamin D deficiency, unspecified: Secondary | ICD-10-CM | POA: Diagnosis not present

## 2021-07-20 DIAGNOSIS — E7800 Pure hypercholesterolemia, unspecified: Secondary | ICD-10-CM

## 2021-07-20 LAB — CBC WITH DIFFERENTIAL/PLATELET
Basophils Absolute: 0 10*3/uL (ref 0.0–0.1)
Basophils Relative: 0.2 % (ref 0.0–3.0)
Eosinophils Absolute: 0 10*3/uL (ref 0.0–0.7)
Eosinophils Relative: 0 % (ref 0.0–5.0)
HCT: 38.5 % (ref 36.0–46.0)
Hemoglobin: 12.7 g/dL (ref 12.0–15.0)
Lymphocytes Relative: 22.5 % (ref 12.0–46.0)
Lymphs Abs: 1.2 10*3/uL (ref 0.7–4.0)
MCHC: 33.1 g/dL (ref 30.0–36.0)
MCV: 85.1 fl (ref 78.0–100.0)
Monocytes Absolute: 0.4 10*3/uL (ref 0.1–1.0)
Monocytes Relative: 6.8 % (ref 3.0–12.0)
Neutro Abs: 3.7 10*3/uL (ref 1.4–7.7)
Neutrophils Relative %: 70.5 % (ref 43.0–77.0)
Platelets: 294 10*3/uL (ref 150.0–400.0)
RBC: 4.53 Mil/uL (ref 3.87–5.11)
RDW: 13.7 % (ref 11.5–15.5)
WBC: 5.2 10*3/uL (ref 4.0–10.5)

## 2021-07-20 LAB — IBC PANEL
Iron: 79 ug/dL (ref 42–145)
Saturation Ratios: 20.2 % (ref 20.0–50.0)
TIBC: 392 ug/dL (ref 250.0–450.0)
Transferrin: 280 mg/dL (ref 212.0–360.0)

## 2021-07-20 LAB — COMPREHENSIVE METABOLIC PANEL
ALT: 14 U/L (ref 0–35)
AST: 15 U/L (ref 0–37)
Albumin: 4.3 g/dL (ref 3.5–5.2)
Alkaline Phosphatase: 79 U/L (ref 39–117)
BUN: 14 mg/dL (ref 6–23)
CO2: 33 mEq/L — ABNORMAL HIGH (ref 19–32)
Calcium: 9.5 mg/dL (ref 8.4–10.5)
Chloride: 100 mEq/L (ref 96–112)
Creatinine, Ser: 0.76 mg/dL (ref 0.40–1.20)
GFR: 88.34 mL/min (ref >60.00)
Glucose, Bld: 109 mg/dL — ABNORMAL HIGH (ref 70–99)
Potassium: 4.1 mEq/L (ref 3.5–5.1)
Sodium: 139 mEq/L (ref 135–145)
Total Bilirubin: 0.7 mg/dL (ref 0.2–1.2)
Total Protein: 6.9 g/dL (ref 6.0–8.3)

## 2021-07-20 LAB — LIPID PANEL
Cholesterol: 225 mg/dL — ABNORMAL HIGH (ref 0–200)
HDL: 58.3 mg/dL (ref >39.00)
LDL Cholesterol: 149 mg/dL — ABNORMAL HIGH (ref 0–99)
NonHDL: 166.86
Total CHOL/HDL Ratio: 4
Triglycerides: 90 mg/dL (ref 0.0–149.0)
VLDL: 18 mg/dL (ref 0.0–40.0)

## 2021-07-20 LAB — TSH: TSH: 0.93 u[IU]/mL (ref 0.35–5.50)

## 2021-07-20 LAB — FERRITIN: Ferritin: 45.5 ng/mL (ref 10.0–291.0)

## 2021-07-20 LAB — VITAMIN D 25 HYDROXY (VIT D DEFICIENCY, FRACTURES): VITD: 39.91 ng/mL (ref 30.00–100.00)

## 2021-07-24 ENCOUNTER — Encounter: Payer: Self-pay | Admitting: Family Medicine

## 2021-07-24 ENCOUNTER — Ambulatory Visit (INDEPENDENT_AMBULATORY_CARE_PROVIDER_SITE_OTHER): Payer: Medicare Other | Admitting: Family Medicine

## 2021-07-24 VITALS — BP 126/82 | HR 92 | Temp 97.9°F | Ht 67.5 in | Wt 170.1 lb

## 2021-07-24 DIAGNOSIS — G589 Mononeuropathy, unspecified: Secondary | ICD-10-CM | POA: Diagnosis not present

## 2021-07-24 DIAGNOSIS — M5412 Radiculopathy, cervical region: Secondary | ICD-10-CM | POA: Diagnosis not present

## 2021-07-24 DIAGNOSIS — N3946 Mixed incontinence: Secondary | ICD-10-CM

## 2021-07-24 DIAGNOSIS — E559 Vitamin D deficiency, unspecified: Secondary | ICD-10-CM

## 2021-07-24 DIAGNOSIS — K3184 Gastroparesis: Secondary | ICD-10-CM

## 2021-07-24 DIAGNOSIS — Z0000 Encounter for general adult medical examination without abnormal findings: Secondary | ICD-10-CM

## 2021-07-24 DIAGNOSIS — G039 Meningitis, unspecified: Secondary | ICD-10-CM

## 2021-07-24 DIAGNOSIS — G500 Trigeminal neuralgia: Secondary | ICD-10-CM

## 2021-07-24 DIAGNOSIS — E611 Iron deficiency: Secondary | ICD-10-CM

## 2021-07-24 DIAGNOSIS — R7989 Other specified abnormal findings of blood chemistry: Secondary | ICD-10-CM

## 2021-07-24 DIAGNOSIS — F331 Major depressive disorder, recurrent, moderate: Secondary | ICD-10-CM

## 2021-07-24 DIAGNOSIS — K582 Mixed irritable bowel syndrome: Secondary | ICD-10-CM

## 2021-07-24 DIAGNOSIS — E7800 Pure hypercholesterolemia, unspecified: Secondary | ICD-10-CM

## 2021-07-24 DIAGNOSIS — M797 Fibromyalgia: Secondary | ICD-10-CM

## 2021-07-24 DIAGNOSIS — E039 Hypothyroidism, unspecified: Secondary | ICD-10-CM

## 2021-07-24 DIAGNOSIS — G894 Chronic pain syndrome: Secondary | ICD-10-CM

## 2021-07-24 DIAGNOSIS — G43109 Migraine with aura, not intractable, without status migrainosus: Secondary | ICD-10-CM | POA: Diagnosis not present

## 2021-07-24 DIAGNOSIS — H168 Other keratitis: Secondary | ICD-10-CM

## 2021-07-24 DIAGNOSIS — K219 Gastro-esophageal reflux disease without esophagitis: Secondary | ICD-10-CM

## 2021-07-24 DIAGNOSIS — K5903 Drug induced constipation: Secondary | ICD-10-CM

## 2021-07-24 DIAGNOSIS — Z7189 Other specified counseling: Secondary | ICD-10-CM | POA: Diagnosis not present

## 2021-07-24 DIAGNOSIS — N319 Neuromuscular dysfunction of bladder, unspecified: Secondary | ICD-10-CM

## 2021-07-24 DIAGNOSIS — F411 Generalized anxiety disorder: Secondary | ICD-10-CM

## 2021-07-24 LAB — POC URINALSYSI DIPSTICK (AUTOMATED)
Blood, UA: NEGATIVE
Glucose, UA: NEGATIVE
Leukocytes, UA: NEGATIVE
Nitrite, UA: NEGATIVE
Protein, UA: NEGATIVE
Spec Grav, UA: >=1.03 — AB (ref 1.010–1.025)
Urobilinogen, UA: 0.2 E.U./dL
pH, UA: 5.5 (ref 5.0–8.0)

## 2021-07-24 MED ORDER — KETOROLAC TROMETHAMINE 30 MG/ML IJ SOLN
30.0000 mg | Freq: Once | INTRAMUSCULAR | Status: AC
  Administered 2021-07-24: 30 mg via INTRAMUSCULAR

## 2021-07-24 NOTE — Assessment & Plan Note (Signed)
Ongoing symptoms.  Toradol IM today.  Continues to see pain clinic.

## 2021-07-24 NOTE — Assessment & Plan Note (Addendum)
Ongoing headache, likely multifactorial including migraines and trigeminal neuralgia - discussing possible additional migraine treatment such as nurtec with pain clinic.  Rpt toradol '30mg'$  IM today.

## 2021-07-24 NOTE — Assessment & Plan Note (Signed)
Intolerance to lyrica/gabapentin - affected memory.

## 2021-07-24 NOTE — Assessment & Plan Note (Signed)
Advanced planning - Received advanced directives 09/2014, scanned. HCPOA is Jimmy Picket then mother Lurena Joiner. Does not want life prolonging measures if terminally ill.

## 2021-07-24 NOTE — Patient Instructions (Addendum)
Urinalysis today.  Continue oral potassium.  Toradol '30mg'$  shot today.  Good to see you today  Return in 4-6 months for follow up visit.  Health Maintenance for Postmenopausal Women Menopause is a normal process in which your ability to get pregnant comes to an end. This process happens slowly over many months or years, usually between the ages of 62 and 62. Menopause is complete when you have missed your menstrual period for 12 months. It is important to talk with your health care provider about some of the most common conditions that affect women after menopause (postmenopausal women). These include heart disease, cancer, and bone loss (osteoporosis). Adopting a healthy lifestyle and getting preventive care can help to promote your health and wellness. The actions you take can also lower your chances of developing some of these common conditions. What are the signs and symptoms of menopause? During menopause, you may have the following symptoms: Hot flashes. These can be moderate or severe. Night sweats. Decrease in sex drive. Mood swings. Headaches. Tiredness (fatigue). Irritability. Memory problems. Problems falling asleep or staying asleep. Talk with your health care provider about treatment options for your symptoms. Do I need hormone replacement therapy? Hormone replacement therapy is effective in treating symptoms that are caused by menopause, such as hot flashes and night sweats. Hormone replacement carries certain risks, especially as you become older. If you are thinking about using estrogen or estrogen with progestin, discuss the benefits and risks with your health care provider. How can I reduce my risk for heart disease and stroke? The risk of heart disease, heart attack, and stroke increases as you age. One of the causes may be a change in the body's hormones during menopause. This can affect how your body uses dietary fats, triglycerides, and cholesterol. Heart attack and  stroke are medical emergencies. There are many things that you can do to help prevent heart disease and stroke. Watch your blood pressure High blood pressure causes heart disease and increases the risk of stroke. This is more likely to develop in people who have high blood pressure readings or are overweight. Have your blood pressure checked: Every 3-5 years if you are 60-71 years of age. Every year if you are 31 years old or older. Eat a healthy diet  Eat a diet that includes plenty of vegetables, fruits, low-fat dairy products, and lean protein. Do not eat a lot of foods that are high in solid fats, added sugars, or sodium. Get regular exercise Get regular exercise. This is one of the most important things you can do for your health. Most adults should: Try to exercise for at least 150 minutes each week. The exercise should increase your heart rate and make you sweat (moderate-intensity exercise). Try to do strengthening exercises at least twice each week. Do these in addition to the moderate-intensity exercise. Spend less time sitting. Even light physical activity can be beneficial. Other tips Work with your health care provider to achieve or maintain a healthy weight. Do not use any products that contain nicotine or tobacco. These products include cigarettes, chewing tobacco, and vaping devices, such as e-cigarettes. If you need help quitting, ask your health care provider. Know your numbers. Ask your health care provider to check your cholesterol and your blood sugar (glucose). Continue to have your blood tested as directed by your health care provider. Do I need screening for cancer? Depending on your health history and family history, you may need to have cancer screenings at different stages  of your life. This may include screening for: Breast cancer. Cervical cancer. Lung cancer. Colorectal cancer. What is my risk for osteoporosis? After menopause, you may be at increased risk for  osteoporosis. Osteoporosis is a condition in which bone destruction happens more quickly than new bone creation. To help prevent osteoporosis or the bone fractures that can happen because of osteoporosis, you may take the following actions: If you are 15-57 years old, get at least 1,000 mg of calcium and at least 600 international units (IU) of vitamin D per day. If you are older than age 18 but younger than age 53, get at least 1,200 mg of calcium and at least 600 international units (IU) of vitamin D per day. If you are older than age 63, get at least 1,200 mg of calcium and at least 800 international units (IU) of vitamin D per day. Smoking and drinking excessive alcohol increase the risk of osteoporosis. Eat foods that are rich in calcium and vitamin D, and do weight-bearing exercises several times each week as directed by your health care provider. How does menopause affect my mental health? Depression may occur at any age, but it is more common as you become older. Common symptoms of depression include: Feeling depressed. Changes in sleep patterns. Changes in appetite or eating patterns. Feeling an overall lack of motivation or enjoyment of activities that you previously enjoyed. Frequent crying spells. Talk with your health care provider if you think that you are experiencing any of these symptoms. General instructions See your health care provider for regular wellness exams and vaccines. This may include: Scheduling regular health, dental, and eye exams. Getting and maintaining your vaccines. These include: Influenza vaccine. Get this vaccine each year before the flu season begins. Pneumonia vaccine. Shingles vaccine. Tetanus, diphtheria, and pertussis (Tdap) booster vaccine. Your health care provider may also recommend other immunizations. Tell your health care provider if you have ever been abused or do not feel safe at home. Summary Menopause is a normal process in which your  ability to get pregnant comes to an end. This condition causes hot flashes, night sweats, decreased interest in sex, mood swings, headaches, or lack of sleep. Treatment for this condition may include hormone replacement therapy. Take actions to keep yourself healthy, including exercising regularly, eating a healthy diet, watching your weight, and checking your blood pressure and blood sugar levels. Get screened for cancer and depression. Make sure that you are up to date with all your vaccines. This information is not intended to replace advice given to you by your health care provider. Make sure you discuss any questions you have with your health care provider. Document Revised: 05/09/2020 Document Reviewed: 05/09/2020 Elsevier Patient Education  [place].

## 2021-07-24 NOTE — Assessment & Plan Note (Addendum)
Chronic, stable period on lorazepam. Not on daily mood medicine.

## 2021-07-24 NOTE — Assessment & Plan Note (Signed)
[facility] - sees regularly, on PRGF eye drops 4-6 times daily.

## 2021-07-24 NOTE — Assessment & Plan Note (Signed)
Levels stable on daily vit D replacement.

## 2021-07-24 NOTE — Progress Notes (Signed)
Patient ID: OVETTA BAZZANO, female    DOB: 1965-12-24, 56 y.o.   MRN: [id number]  This visit was conducted in person.  BP 126/82   Pulse 92   Temp 97.9 F (36.6 C) (Temporal)   Ht 5' 7.5" (1.715 m)   Wt 170 lb 2 oz (77.2 kg)   LMP 10/04/2014 Comment: per labs  SpO2 96%   BMI 26.25 kg/m    CC: CPE Subjective:   HPI: JOANI COSMA is a 56 y.o. female presenting on 07/24/2021 for Annual Exam (MCR prt 2.)   Saw health advisor last moth for medicare wellness visit. Note reviewed.  Went straight to phone, no video visit was attempted.   No results found.  Flowsheet Row Clinical Support from 06/02/2021 in [facility]  PHQ-2 Total Score 3          06/02/2021    2:22 PM 05/31/2021    5:53 PM 06/01/2020    3:22 PM 01/18/2017    2:04 PM 10/12/2016   11:03 AM  Fall Risk   Falls in the past year? 0 0 0 Yes Yes  Number falls in past yr: 0   1 2 or more  Injury with Fall? 0    No  Risk Factor Category      High Fall Risk  Risk for fall due to : Impaired balance/gait;Impaired mobility;Medication side effect    Impaired balance/gait;Impaired mobility;History of fall(s)  Follow up Falls evaluation completed;Education provided;Falls prevention discussed      Worried about mother's alcohol use.   Known neurotrophic keratitis of L eye in setting of L trigeminal neuralgia with facial residual numbness. Treating keratitis with PRGF QID. [facility].   Worsening migraines - thinks related to possible cervical pinched nerve as she's noticing increasing hand numbness.  Saw Dr Damita Dunnings earlier this month with complicated migraine treated with toradol '30mg'$ , phenergan '25mg'$  IM x1. Phenergan helped her sleep however toradol didn't seem to help migraine pain.  Last saw Dr Melrose Nakayama neurology 02/2020.  Has been discussing migraine management with pain clinic Dr Hardin Negus - had previously decided not to try Nurtec due to concern over worsening GI upset.   Feeling more tired  recently - run down, low energy. This is despite taking b-complex vitamin.   Sees Dr Gabriel Carina endo for hypothyroidism due to Hashimoto's thyroiditis on armour thyroid '90mg'$  daily, once weekly only taking 1/2 tab.   She has been working with Physiological scientist to regain R leg strength.   Preventative: COLONOSCOPY 10/2016 rpt 10 yrs [facility])  COLONOSCOPY 03/2019 - 1 polyp, biopsies taken ([place]) - sees Dr Earnie Larsson GI.  Well woman: [facility]. Normal pap 08/2018, Q5 yrs. Post-menopausal confirmed by hormone testing.  Mammogram 09/2020 [facility] @ [place]. Fibrous breasts - strong fmhx breast cancer (maternal aunt, maternal great aunt).  Lung cancer screening - not eligible  Flu shot - declines - has reacted previously COVID vaccine - discussed, declined Tetanus - declines due to coverage  Shingrix - declines  Advanced planning - Received advanced directives 09/2014, scanned. HCPOA is Jimmy Picket then mother Lurena Joiner. Does not want life prolonging measures if terminally ill.  Seat belt use discussed  Sunscreen use discussed. No changing moles on skin.  Non smoker  Alcohol - rarely  Dentist - q6 mo - upcoming possible dental work  Ophthalmologist - sees regularly for neurotrophic keratitis  LMP sometime 2016 - lab confirmed menopause  Bowel - constipation > diarrhea ?oral iron  related Bladder - both urge and stress incontinence - with accidents, wears pad, also recently notes increase in bladder spasms.    Lives with husband and 2 daughters (one in college) Occupation: was Public affairs consultant, on disability since 2011 - arachnoiditis Edu: associate's degree Activity: severe limitations 2/2 arachnoiditis and chronic pain, walks to end of driveway and back  Diet: good water, fruits/vegetables daily      Relevant past medical, surgical, family and social history reviewed and updated as indicated. Interim medical history since our last visit reviewed. Allergies and medications  reviewed and updated. Outpatient Medications Prior to Visit  Medication Sig Dispense Refill  . Ascorbic Acid (VITAMIN C PO) Take 1 tablet by mouth as needed (for immune system support).     . baclofen (LIORESAL) 10 MG tablet Take 0.5 tablets (5 mg total) by mouth 3 (three) times daily as needed.    . calcium carbonate (TUMS EX) 750 MG chewable tablet Chew 2-3 tablets by mouth 2 (two) times daily as needed.    . camphor-menthol (SARNA) lotion Apply 1 application topically at bedtime as needed for itching.     . Carboxymethylcellulose Sodium (EYE DROPS OP) Apply to eye. PRGF eye drops. 1 drop 4-6 times a day in left eye    . Cholecalciferol (VITAMIN D) 2000 units CAPS Take 1 capsule (2,000 Units total) by mouth daily. 30 capsule   . diclofenac sodium (VOLTAREN) 1 % GEL APPLY THIN LAYER EXTERNALLY TO THE AFFECTED AREA UP TO FOUR TIMES DAILY AS NEEDED 300 g 0  . docusate sodium (COLACE) 100 MG capsule Take 100 mg by mouth 2 (two) times daily. As needed    . LORazepam (ATIVAN) 1 MG tablet Take 0.5-1 tablets (0.5-1 mg total) by mouth 2 (two) times daily as needed for anxiety. 60 tablet 0  . ondansetron (ZOFRAN-ODT) 4 MG disintegrating tablet Take 1 tablet (4 mg total) by mouth every 8 (eight) hours as needed for nausea or vomiting. 20 tablet 0  . Potassium 99 MG TABS Take 1 tablet (99 mg total) by mouth daily.  0  . sucralfate (CARAFATE) 1 g tablet Take 1 g by mouth 4 (four) times daily.    Marland Kitchen thyroid (ARMOUR) 90 MG tablet Take 1 tablet (90 mg total) by mouth daily. One day a week take 1/2 tablet    . NUCYNTA ER 200 MG TB12 Takes 250 mg twice daily    . NUCYNTA ER 250 MG TB12 SMARTSIG:1 Tablet(s) By Mouth Every 12 Hours    . omeprazole (PRILOSEC) 40 MG capsule Take 40 mg by mouth 2 (two) times daily. (Patient not taking: Reported on 07/14/2021)    . pediatric multivitamin-iron (POLY-VI-SOL WITH IRON) 15 MG chewable tablet Chew 1 tablet by mouth daily.     No facility-administered medications prior to  visit.     Per HPI unless specifically indicated in ROS section below Review of Systems  Constitutional:  Positive for appetite change (decreased) and fatigue. Negative for activity change, chills, fever and unexpected weight change.  HENT:  Negative for hearing loss.   Eyes:  Negative for visual disturbance.  Respiratory:  Positive for chest tightness and shortness of breath. Negative for cough and wheezing.   Cardiovascular:  Positive for palpitations. Negative for chest pain and leg swelling.  Gastrointestinal:  Positive for abdominal pain and nausea. Negative for abdominal distention, blood in stool, constipation, diarrhea and vomiting.  Genitourinary:  Negative for difficulty urinating and hematuria.  Musculoskeletal:  Negative for arthralgias,  myalgias and neck pain.  Skin:  Negative for rash.  Neurological:  Positive for dizziness and headaches (migraines). Negative for seizures and syncope.  Hematological:  Positive for adenopathy (around left ear). Does not bruise/bleed easily.  Psychiatric/Behavioral:  Positive for dysphoric mood. The patient is nervous/anxious.     Objective:  BP 126/82   Pulse 92   Temp 97.9 F (36.6 C) (Temporal)   Ht 5' 7.5" (1.715 m)   Wt 170 lb 2 oz (77.2 kg)   LMP 10/04/2014 Comment: per labs  SpO2 96%   BMI 26.25 kg/m   Wt Readings from Last 3 Encounters:  07/24/21 170 lb 2 oz (77.2 kg)  07/14/21 169 lb (76.7 kg)  06/02/21 167 lb (75.8 kg)      Physical Exam Vitals and nursing note reviewed.  Constitutional:      Appearance: Normal appearance. She is not ill-appearing.  HENT:     Head: Normocephalic and atraumatic.     Right Ear: Tympanic membrane, ear canal and external ear normal. There is no impacted cerumen.     Left Ear: Tympanic membrane, ear canal and external ear normal. There is no impacted cerumen.     Mouth/Throat:     Mouth: Mucous membranes are moist.     Pharynx: Oropharynx is clear. No oropharyngeal exudate or posterior  oropharyngeal erythema.  Eyes:     General:        Right eye: No discharge.        Left eye: No discharge.     Extraocular Movements: Extraocular movements intact.     Conjunctiva/sclera: Conjunctivae normal.  Neck:     Thyroid: No thyroid mass or thyromegaly.  Cardiovascular:     Rate and Rhythm: Normal rate and regular rhythm.     Pulses: Normal pulses.     Heart sounds: Normal heart sounds. No murmur heard. Pulmonary:     Effort: Pulmonary effort is normal. No respiratory distress.     Breath sounds: Normal breath sounds. No wheezing, rhonchi or rales.  Abdominal:     General: Bowel sounds are normal. There is no distension.     Palpations: Abdomen is soft. There is no mass.     Tenderness: There is no abdominal tenderness. There is no guarding or rebound.     Hernia: No hernia is present.  Musculoskeletal:     Cervical back: Normal range of motion and neck supple. No rigidity.     Right lower leg: No edema.     Left lower leg: No edema.  Lymphadenopathy:     Cervical: No cervical adenopathy.  Skin:    General: Skin is warm and dry.     Findings: No rash.  Neurological:     General: No focal deficit present.     Mental Status: She is alert. Mental status is at baseline.  Psychiatric:        Mood and Affect: Mood normal.        Behavior: Behavior normal.      Results for orders placed or performed in visit on 07/24/21  POCT Urinalysis Dipstick (Automated)  Result Value Ref Range   Color, UA dark yellow    Clarity, UA clear    Glucose, UA Negative Negative   Bilirubin, UA 1+    Ketones, UA +/-    Spec Grav, UA >=1.030 (A) 1.010 - 1.025   Blood, UA negative    pH, UA 5.5 5.0 - 8.0   Protein, UA Negative Negative  Urobilinogen, UA 0.2 0.2 or 1.0 E.U./dL   Nitrite, UA negative    Leukocytes, UA Negative Negative      06/02/2021    2:23 PM 06/01/2020    4:40 PM 10/12/2016   11:03 AM 10/05/2015   10:45 AM 07/15/2014   11:26 AM  Depression screen PHQ 2/9  Decreased  Interest '2 3 3 2 2  '$ Down, Depressed, Hopeless '1 1 2 2 2  '$ PHQ - 2 Score '3 4 5 4 4  '$ Altered sleeping '3 2 2 3 2  '$ Tired, decreased energy '3 3 2 3 2  '$ Change in appetite '3 3 2 3 1  '$ Feeling bad or failure about yourself  '1 1 2 3 1  '$ Trouble concentrating '3 1 2 3 1  '$ Moving slowly or fidgety/restless 0 0 1  0  Suicidal thoughts 0 0 '1 2 1  '$ PHQ-9 Score '16 14 17 21 12  '$ Difficult doing work/chores Very difficult  Very difficult Somewhat difficult    Assessment & Plan:   Problem List Items Addressed This Visit     Advanced care planning/counseling discussion (Chronic)    Advanced planning - Received advanced directives 09/2014, scanned. HCPOA is Jimmy Picket then mother Lurena Joiner. Does not want life prolonging measures if terminally ill.       Health maintenance examination - Primary (Chronic)    Preventative protocols reviewed and updated unless pt declined. Discussed healthy diet and lifestyle.  She avoids vaccines due to thimerosal allergy concerns      Relevant Orders   POCT Urinalysis Dipstick (Automated) (Completed)   Hypothyroidism    Followed by endo Dr Gabriel Carina on Armour thyroid - appreciate her care.       Fibromyalgia    Intolerance to lyrica/gabapentin - affected memory.       Relevant Medications   NUCYNTA ER 250 MG TB12   GERD (gastroesophageal reflux disease)    Severe, currently off carafate and PPI, only using PRN      Chronic pain syndrome    Followed by pain management [facility] Dr Hardin Negus.       Relevant Medications   NUCYNTA ER 250 MG TB12   MDD (major depressive disorder), recurrent episode, moderate (HCC)    Chronic, stable period on lorazepam. Not on daily mood medicine.       Arachnoiditis    Contributes to chronic pain.       Hyperlipidemia    Chronic, not on statin. Hesitant to start. Reviewed diet choices to improve cholesterol levels, consider rpt 6 months along with apolipoprotein B for more comprehensive cardiovascular risk assessment.   The 10-year ASCVD risk score (Arnett DK, et al., 2019) is: 2.1%   Values used to calculate the score:     Age: 22 years     Sex: Female     Is Non-Hispanic African American: No     Diabetic: No     Tobacco smoker: No     Systolic Blood Pressure: 412 mmHg     Is BP treated: No     HDL Cholesterol: 58.3 mg/dL     Total Cholesterol: 225 mg/dL       Complicated migraine    Ongoing headache, likely multifactorial including migraines and trigeminal neuralgia - discussing possible additional migraine treatment such as nurtec with pain clinic.  Rpt toradol '30mg'$  IM today.       Relevant Medications   NUCYNTA ER 250 MG TB12   Vitamin D deficiency    Levels stable on  daily vit D replacement.       Iron deficiency    Levels stable off replacement.       Constipation    Recent worsening after she took some doses of oral iron.       Gastroparesis    Continue f/u with [facility].       Irritable bowel syndrome   Neurogenic bladder   Mixed urge and stress incontinence    Update UA.  Previous medication trials ineffective.       GAD (generalized anxiety disorder)    Continue lorazepam prn      Trigeminal neuralgia    Previously saw neurology, now followed by pain clinic.       Neurotrophic keratitis of left eye    [facility] - sees regularly, on PRGF eye drops 4-6 times daily.       Radiculitis of right cervical region    Ongoing symptoms.  Toradol IM today.  Continues to see pain clinic.       Low serum cortisol level    Reassuring recent eval by endo        Meds ordered this encounter  Medications  . ketorolac (TORADOL) 30 MG/ML injection 30 mg   Orders Placed This Encounter  Procedures  . POCT Urinalysis Dipstick (Automated)    Patient instructions: Urinalysis today.  Continue oral potassium.  Toradol '30mg'$  shot today.  Good to see you today  Return in 4-6 months for follow up visit.  Follow up plan: Return if symptoms worsen or  fail to improve.  Ria Bush, MD

## 2021-07-24 NOTE — Assessment & Plan Note (Signed)
Reassuring recent eval by endo

## 2021-07-24 NOTE — Assessment & Plan Note (Signed)
Continue lorazepam prn

## 2021-07-24 NOTE — Assessment & Plan Note (Signed)
Update UA.  Previous medication trials ineffective.

## 2021-07-24 NOTE — Assessment & Plan Note (Addendum)
Chronic, not on statin. Hesitant to start. Reviewed diet choices to improve cholesterol levels, consider rpt 6 months along with apolipoprotein B for more comprehensive cardiovascular risk assessment.  The 10-year ASCVD risk score (Arnett DK, et al., 2019) is: 2.1%   Values used to calculate the score:     Age: 56 years     Sex: Female     Is Non-Hispanic African American: No     Diabetic: No     Tobacco smoker: No     Systolic Blood Pressure: 341 mmHg     Is BP treated: No     HDL Cholesterol: 58.3 mg/dL     Total Cholesterol: 225 mg/dL

## 2021-07-24 NOTE — Assessment & Plan Note (Signed)
Previously saw neurology, now followed by pain clinic.

## 2021-07-24 NOTE — Assessment & Plan Note (Addendum)
Severe, currently off carafate and PPI, only using PRN

## 2021-07-24 NOTE — Assessment & Plan Note (Signed)
Followed by endo Dr Gabriel Carina on Armour thyroid - appreciate her care.

## 2021-07-24 NOTE — Assessment & Plan Note (Addendum)
Followed by pain management [facility] Dr Hardin Negus.

## 2021-07-24 NOTE — Assessment & Plan Note (Signed)
Levels stable off replacement.  

## 2021-07-24 NOTE — Assessment & Plan Note (Signed)
Contributes to chronic pain.

## 2021-07-24 NOTE — Assessment & Plan Note (Addendum)
Preventative protocols reviewed and updated unless pt declined. Discussed healthy diet and lifestyle.  She avoids vaccines due to thimerosal allergy concerns

## 2021-07-24 NOTE — Assessment & Plan Note (Addendum)
Continue f/u with [facility].

## 2021-07-24 NOTE — Assessment & Plan Note (Signed)
Recent worsening after she took some doses of oral iron.

## 2021-09-07 ENCOUNTER — Other Ambulatory Visit: Payer: Self-pay | Admitting: Family Medicine

## 2021-09-07 MED ORDER — LORAZEPAM 1 MG PO TABS: 0.5000 mg | ORAL_TABLET | Freq: Two times a day (BID) | ORAL | 0 refills | Status: DC | PRN

## 2021-09-07 NOTE — Telephone Encounter (Signed)
ERx 

## 2021-09-07 NOTE — Telephone Encounter (Signed)
Name of Medication: Lorazepam Name of Pharmacy: [facility] or Written Date and Quantity: 07/11/21, #60 Last Office Visit and Type: 07/24/21, CPE Next Office Visit and Type: 12/01/21, f/u Last Controlled Substance Agreement Date: 06/10/13 Last UDS: scanned

## 2021-10-04 ENCOUNTER — Encounter: Payer: Self-pay | Admitting: Family Medicine

## 2021-10-04 ENCOUNTER — Encounter: Payer: Self-pay | Admitting: Nurse Practitioner

## 2021-10-04 ENCOUNTER — Ambulatory Visit: Payer: Medicare Other | Admitting: Nurse Practitioner

## 2021-10-04 VITALS — BP 104/70 | HR 88 | Temp 96.9°F | Resp 18 | Wt 172.1 lb

## 2021-10-04 DIAGNOSIS — J040 Acute laryngitis: Secondary | ICD-10-CM | POA: Diagnosis not present

## 2021-10-04 DIAGNOSIS — H6993 Unspecified Eustachian tube disorder, bilateral: Secondary | ICD-10-CM

## 2021-10-04 DIAGNOSIS — R0789 Other chest pain: Secondary | ICD-10-CM | POA: Insufficient documentation

## 2021-10-04 DIAGNOSIS — R051 Acute cough: Secondary | ICD-10-CM | POA: Diagnosis not present

## 2021-10-04 MED ORDER — HYDROCODONE BIT-HOMATROP MBR 5-1.5 MG/5ML PO SOLN: 5.0000 mL | Freq: Two times a day (BID) | ORAL | 0 refills | Status: AC | PRN

## 2021-10-04 MED ORDER — PREDNISONE 20 MG PO TABS: ORAL_TABLET | ORAL | 0 refills | Status: AC

## 2021-10-04 NOTE — Progress Notes (Signed)
Acute Office Visit  Subjective:     Patient ID: Karen Aguilar, female    DOB: 1965/01/22, 56 y.o.   MRN: [id number]  Chief Complaint  Patient presents with   Cough    X 3 to 4 days, having crackling and wheezing in the chest, got worse in the last day or so, chest congestion, loosing voice. Covid test negative today at home. Has taking Nyquil and Dayquil.     Cough Associated symptoms include chills, ear pain (full), headaches, shortness of breath and wheezing. Pertinent negatives include no chest pain, fever or sore throat.   Patient is in today for COUGH  Symptoms started approximately 4 days ago.  States her grandchildren had a sniffle but have since recovered. Covid test today that was negative No covid vaccines. Has been dayquill and nyquill. States minimal help. States that she feels that it is getting worse.  State that she took severe flu nighttime medication without help   Review of Systems  Constitutional:  Positive for chills. Negative for fever.       Appetitie has decreased Fluid intake good   HENT:  Positive for ear pain (full). Negative for ear discharge, sinus pain and sore throat.   Respiratory:  Positive for cough, shortness of breath and wheezing.   Cardiovascular:  Negative for chest pain.  Neurological:  Positive for dizziness (with coughing jag) and headaches.        Objective:    BP 104/70   Pulse 88   Temp (!) 96.9 F (36.1 C) (Temporal)   Resp 18   Wt 172 lb 2 oz (78.1 kg)   LMP 10/04/2014 Comment: per labs  SpO2 97%   BMI 26.56 kg/m    Physical Exam Vitals and nursing note reviewed.  Constitutional:      Appearance: Normal appearance.  HENT:     Right Ear: Ear canal and external ear normal.     Left Ear: Ear canal and external ear normal.     Ears:     Comments: Clear fluid behind bilateral TMs    Nose:     Right Sinus: No maxillary sinus tenderness or frontal sinus tenderness.     Left Sinus: No maxillary sinus tenderness or  frontal sinus tenderness.     Mouth/Throat:     Mouth: Mucous membranes are moist.     Pharynx: Oropharynx is clear.  Cardiovascular:     Rate and Rhythm: Normal rate and regular rhythm.     Heart sounds: Normal heart sounds.  Pulmonary:     Effort: Pulmonary effort is normal.     Breath sounds: Normal breath sounds.  Lymphadenopathy:     Cervical: Cervical adenopathy present.  Neurological:     Mental Status: She is alert.     No results found for any visits on 10/04/21.      Assessment & Plan:   Problem List Items Addressed This Visit       Respiratory   Laryngitis - Primary    Viral in nature.  Reassurance treatment of symptoms.  Follow-up if no improvement        Nervous and Auditory   Eustachian tube dysfunction, bilateral    Clear fluid by hand bilateral TMs.  We will put patient on prednisone 20 mg taper.  Prednisone precautions listed      Relevant Medications   predniSONE (DELTASONE) 20 MG tablet     Other   Acute cough    Patient states she is  done Tussionex in the past.  Will write Hycodan 5 mL twice daily for 7 days.  Sedation precautions reviewed      Relevant Medications   HYDROcodone bit-homatropine (HYCODAN) 5-1.5 MG/5ML syrup   Chest tightness    No wheezing on exam.  We will start patient on prednisone 20 mg taper.      Relevant Medications   predniSONE (DELTASONE) 20 MG tablet    Meds ordered this encounter  Medications   predniSONE (DELTASONE) 20 MG tablet    Sig: Take 1 tablet (20 mg total) by mouth 2 (two) times daily with a meal for 3 days, THEN 1 tablet (20 mg total) daily with breakfast for 3 days. Avoid NSAIDS like: ibuprofen, motrin, aleve, naproxen, BC/Goody powder.    Dispense:  9 tablet    Refill:  0    Order Specific Question:   Supervising Provider    Answer:   Glori Bickers, MARNE A [[id number]]   HYDROcodone bit-homatropine (HYCODAN) 5-1.5 MG/5ML syrup    Sig: Take 5 mLs by mouth 2 (two) times daily as needed for up to 7 days for  cough.    Dispense:  70 mL    Refill:  0    Order Specific Question:   Supervising Provider    Answer:   Loura Pardon A [[id number]]    Return if symptoms worsen or fail to improve.  Romilda Garret, NP

## 2021-10-04 NOTE — Assessment & Plan Note (Signed)
Viral in nature.  Reassurance treatment of symptoms.  Follow-up if no improvement

## 2021-10-04 NOTE — Assessment & Plan Note (Signed)
No wheezing on exam.  We will start patient on prednisone 20 mg taper.

## 2021-10-04 NOTE — Patient Instructions (Signed)
Nice to see you today I have sent in steroids and cough medication that may make you sleepy. Follow up if you do not improve

## 2021-10-04 NOTE — Assessment & Plan Note (Signed)
Clear fluid by hand bilateral TMs.  We will put patient on prednisone 20 mg taper.  Prednisone precautions listed

## 2021-10-04 NOTE — Telephone Encounter (Signed)
Spoke with pt offering OV today with Matt at 2:00.  Pt accepted offer.  Pt added to schedule.

## 2021-10-04 NOTE — Assessment & Plan Note (Signed)
Patient states she is done Tussionex in the past.  Will write Hycodan 5 mL twice daily for 7 days.  Sedation precautions reviewed

## 2021-10-09 ENCOUNTER — Encounter: Payer: Self-pay | Admitting: Nurse Practitioner

## 2021-10-09 DIAGNOSIS — R051 Acute cough: Secondary | ICD-10-CM

## 2021-10-09 DIAGNOSIS — J040 Acute laryngitis: Secondary | ICD-10-CM

## 2021-10-10 MED ORDER — DOXYCYCLINE HYCLATE 100 MG PO TABS: 100.0000 mg | ORAL_TABLET | Freq: Two times a day (BID) | ORAL | 0 refills | Status: AC

## 2021-10-10 MED ORDER — BENZONATATE 200 MG PO CAPS: 200.0000 mg | ORAL_CAPSULE | Freq: Two times a day (BID) | ORAL | 0 refills | Status: DC | PRN

## 2021-10-11 MED ORDER — HYDROCOD POLI-CHLORPHE POLI ER 10-8 MG/5ML PO SUER: 5.0000 mL | Freq: Every evening | ORAL | 0 refills | Status: AC | PRN

## 2021-10-11 NOTE — Addendum Note (Signed)
Addended by: Michela Pitcher on: 10/11/2021 07:26 AM   Modules accepted: Orders

## 2021-10-12 ENCOUNTER — Telehealth: Payer: Self-pay

## 2021-10-12 NOTE — Telephone Encounter (Signed)
Prior auth started for Hydrocod Poli-Chlorphe Poli ER 10-'8MG'$ /5ML er suspension (Tussionex). Lisaann Yuhas Key: [id number] - PA Case ID: [id number] - Rx #: [id number] Waiting for determination.

## 2021-10-12 NOTE — Telephone Encounter (Signed)
Prior auth for [employer] ER 10-'8MG'$ /5ML er suspension (Tussionex) has been denied. Karen Aguilar Key: [id number] - PA Case ID: [id number] - Rx #: [id number] HYD POL/CPM SUS 10-8/5ML is not a Part D-eligible medication as defined by the Medicare Part D benefit and is not covered under your Part D prescription drug plan.  **Please note: To be eligible for Part D, this drug must be used for a Medicare compendiasupported condition that does not include the symptomatic relief of cough/cold.  Denial letter sent to scanning.

## 2021-10-13 ENCOUNTER — Other Ambulatory Visit: Payer: Self-pay | Admitting: Family Medicine

## 2021-10-13 DIAGNOSIS — Z1231 Encounter for screening mammogram for malignant neoplasm of breast: Secondary | ICD-10-CM

## 2021-10-17 ENCOUNTER — Telehealth: Payer: Self-pay

## 2021-10-17 NOTE — Telephone Encounter (Signed)
Thank you.  Prior auth cancelled.

## 2021-10-17 NOTE — Telephone Encounter (Signed)
This is not needed. Patient took cough syrup.

## 2021-10-17 NOTE — Telephone Encounter (Signed)
Prior auth started for Benzonatate '200MG'$  capsules. Shaivi Rounsaville KeyFoye Spurling - [place] Case ID: [id number] - Rx #: [id number] Waiting for determination.

## 2021-11-03 ENCOUNTER — Other Ambulatory Visit: Payer: Self-pay | Admitting: Family Medicine

## 2021-11-03 MED ORDER — LORAZEPAM 1 MG PO TABS: 0.5000 mg | ORAL_TABLET | Freq: Two times a day (BID) | ORAL | 0 refills | Status: DC | PRN

## 2021-11-03 NOTE — Telephone Encounter (Signed)
ERx 

## 2021-11-03 NOTE — Telephone Encounter (Signed)
Refill request Lorazepam Last office visit acute 10/04/21 Last refill 09/07/21 #60

## 2021-11-14 ENCOUNTER — Ambulatory Visit
Admission: RE | Admit: 2021-11-14 | Discharge: 2021-11-14 | Disposition: A | Discharge status: Home / Self Care | Payer: Medicare Other | Source: Ambulatory Visit | Attending: Family Medicine | Admitting: Family Medicine

## 2021-11-14 DIAGNOSIS — Z1231 Encounter for screening mammogram for malignant neoplasm of breast: Secondary | ICD-10-CM | POA: Diagnosis present

## 2021-12-01 ENCOUNTER — Encounter: Payer: Self-pay | Admitting: Family Medicine

## 2021-12-01 ENCOUNTER — Ambulatory Visit (INDEPENDENT_AMBULATORY_CARE_PROVIDER_SITE_OTHER): Payer: Medicare Other | Admitting: Family Medicine

## 2021-12-01 VITALS — BP 126/84 | HR 102 | Temp 97.4°F | Ht 67.5 in | Wt 173.2 lb

## 2021-12-01 DIAGNOSIS — G43109 Migraine with aura, not intractable, without status migrainosus: Secondary | ICD-10-CM

## 2021-12-01 DIAGNOSIS — R5382 Chronic fatigue, unspecified: Secondary | ICD-10-CM | POA: Diagnosis not present

## 2021-12-01 DIAGNOSIS — E559 Vitamin D deficiency, unspecified: Secondary | ICD-10-CM

## 2021-12-01 DIAGNOSIS — H04123 Dry eye syndrome of bilateral lacrimal glands: Secondary | ICD-10-CM

## 2021-12-01 DIAGNOSIS — H168 Other keratitis: Secondary | ICD-10-CM

## 2021-12-01 DIAGNOSIS — E063 Autoimmune thyroiditis: Secondary | ICD-10-CM

## 2021-12-01 DIAGNOSIS — E038 Other specified hypothyroidism: Secondary | ICD-10-CM

## 2021-12-01 DIAGNOSIS — M797 Fibromyalgia: Secondary | ICD-10-CM

## 2021-12-01 DIAGNOSIS — G500 Trigeminal neuralgia: Secondary | ICD-10-CM

## 2021-12-01 DIAGNOSIS — K219 Gastro-esophageal reflux disease without esophagitis: Secondary | ICD-10-CM

## 2021-12-01 LAB — VITAMIN D 25 HYDROXY (VIT D DEFICIENCY, FRACTURES): VITD: 36.24 ng/mL (ref 30.00–100.00)

## 2021-12-01 LAB — VITAMIN B12: Vitamin B-12: 528 pg/mL (ref 211–911)

## 2021-12-01 MED ORDER — NONFORMULARY OR COMPOUNDED ITEM: Status: DC

## 2021-12-01 NOTE — Assessment & Plan Note (Signed)
Update levels on regular replacement

## 2021-12-01 NOTE — Progress Notes (Signed)
Patient ID: Karen Aguilar, female    DOB: 03-Dec-1965, 56 y.o.   MRN: [id number]  This visit was conducted in person.  BP 126/84   Pulse (!) 102   Temp (!) 97.4 F (36.3 C) (Temporal)   Ht 5' 7.5" (1.715 m)   Wt 173 lb 3.2 oz (78.6 kg)   LMP 10/04/2014 Comment: per labs  SpO2 94%   BMI 26.73 kg/m   Pulse Readings from Last 3 Encounters:  12/01/21 (!) 102  10/04/21 88  07/24/21 92    CC: 4 mo f/u visit  Subjective:   HPI: Karen Aguilar is a 56 y.o. female presenting on 12/01/2021 for Follow-up (Here for 4 mo f/u. C/o dizziness. )   Sees [facility], most recently [facility] for neurotrophic keratopathy, on PRGF treatment. Pending appt with [facility] for L upper eyelid entropion.   Saw Dr Gabriel Carina for hashimoto hypothyroidism on armour thyroid '90mg'$  6d/wk, with 1/2 dose once a week. To consider black cohosh for post menopausal hot flashes.   Ongoing migraines - having some every day. Last saw neurology Dr Melrose Nakayama 02/2020. Had discussed migraine treatment with pain clinic Dr Hardin Negus - tried nurtec which decreased intensity of headache but didn't fully resolve, and caused nausea so she's had difficulty tolerating regular preventative dosing. Wonders if she regularly restarts prilosec if sh'e tolerate Nurtec better.   Started high dose vitamin B complex as well as potassium, vitamin E, vitamin C and magnesium which may have helped GI symptoms significantly. She's recently started fish oil daily per cornea clinic recommendations. Had recently run out of vitamin d.   Pepcid has previously been ineffective.      Relevant past medical, surgical, family and social history reviewed and updated as indicated. Interim medical history since our last visit reviewed. Allergies and medications reviewed and updated. Outpatient Medications Prior to Visit  Medication Sig Dispense Refill   Ascorbic Acid (VITAMIN C PO) Take 1 tablet by mouth as needed (for immune system  support).      baclofen (LIORESAL) 10 MG tablet Take 0.5 tablets (5 mg total) by mouth 3 (three) times daily as needed.     calcium carbonate (TUMS EX) 750 MG chewable tablet Chew 2-3 tablets by mouth 2 (two) times daily as needed.     camphor-menthol (SARNA) lotion Apply 1 application topically at bedtime as needed for itching.      Carboxymethylcellulose Sodium (EYE DROPS OP) Apply to eye. PRGF eye drops. 1 drop 4-6 times a day in left eye     Cholecalciferol (VITAMIN D) 2000 units CAPS Take 1 capsule (2,000 Units total) by mouth daily. 30 capsule    diclofenac sodium (VOLTAREN) 1 % GEL APPLY THIN LAYER EXTERNALLY TO THE AFFECTED AREA UP TO FOUR TIMES DAILY AS NEEDED 300 g 0   docusate sodium (COLACE) 100 MG capsule Take 100 mg by mouth 2 (two) times daily. As needed     LORazepam (ATIVAN) 1 MG tablet Take 0.5-1 tablets (0.5-1 mg total) by mouth 2 (two) times daily as needed for anxiety. 60 tablet 0   NUCYNTA ER 250 MG TB12 SMARTSIG:1 Tablet(s) By Mouth Every 12 Hours     omeprazole (PRILOSEC) 40 MG capsule Take 40 mg by mouth 2 (two) times daily.     ondansetron (ZOFRAN-ODT) 4 MG disintegrating tablet Take 1 tablet (4 mg total) by mouth every 8 (eight) hours as needed for nausea or vomiting. 20 tablet 0  Potassium 99 MG TABS Take 1 tablet (99 mg total) by mouth daily.  0   Rimegepant Sulfate (NURTEC) 75 MG TBDP Take by mouth.     sucralfate (CARAFATE) 1 g tablet Take 1 g by mouth 4 (four) times daily.     thyroid (ARMOUR) 90 MG tablet Take 1 tablet (90 mg total) by mouth daily. One day a week take 1/2 tablet     TYRVAYA 0.03 MG/ACT SOLN Place 1 spray into both nostrils 2 (two) times daily.     benzonatate (TESSALON) 200 MG capsule Take 1 capsule (200 mg total) by mouth 2 (two) times daily as needed for cough. 20 capsule 0   No facility-administered medications prior to visit.     Per HPI unless specifically indicated in ROS section below Review of Systems  Objective:  BP 126/84   Pulse  (!) 102   Temp (!) 97.4 F (36.3 C) (Temporal)   Ht 5' 7.5" (1.715 m)   Wt 173 lb 3.2 oz (78.6 kg)   LMP 10/04/2014 Comment: per labs  SpO2 94%   BMI 26.73 kg/m   Wt Readings from Last 3 Encounters:  12/01/21 173 lb 3.2 oz (78.6 kg)  10/04/21 172 lb 2 oz (78.1 kg)  07/24/21 170 lb 2 oz (77.2 kg)      Physical Exam Vitals and nursing note reviewed.  Constitutional:      Appearance: Normal appearance. She is not ill-appearing.  HENT:     Head: Normocephalic and atraumatic.  Eyes:     Extraocular Movements: Extraocular movements intact.     Pupils: Pupils are equal, round, and reactive to light.  Cardiovascular:     Rate and Rhythm: Regular rhythm. Tachycardia present.     Pulses: Normal pulses.     Heart sounds: Normal heart sounds. No murmur heard. Pulmonary:     Effort: Pulmonary effort is normal. No respiratory distress.     Breath sounds: Normal breath sounds. No wheezing, rhonchi or rales.  Skin:    General: Skin is warm and dry.     Findings: No rash.  Neurological:     Mental Status: She is alert.  Psychiatric:        Mood and Affect: Mood normal.        Behavior: Behavior normal.       Results for orders placed or performed in visit on 07/24/21  POCT Urinalysis Dipstick (Automated)  Result Value Ref Range   Color, UA dark yellow    Clarity, UA clear    Glucose, UA Negative Negative   Bilirubin, UA 1+    Ketones, UA +/-    Spec Grav, UA >=1.030 (A) 1.010 - 1.025   Blood, UA negative    pH, UA 5.5 5.0 - 8.0   Protein, UA Negative Negative   Urobilinogen, UA 0.2 0.2 or 1.0 E.U./dL   Nitrite, UA negative    Leukocytes, UA Negative Negative    Assessment & Plan:   Problem List Items Addressed This Visit       Unprioritized   Hypothyroidism due to Hashimoto's thyroiditis    Appreciate endo care.       Fibromyalgia   GERD (gastroesophageal reflux disease)    Stable period off daily PPI. Feels vitamin supplementation helped GI symptoms.        Complicated migraine    Ongoing headaches, sees pain clinic Dr Hardin Negus for this, on nurtec. Hopefully nurtec will also help Trigeminal Neuralgia pain.  Has had difficulty tolerating regular  nurtec dosing.  Discussed regular pepcid vs regular omeprazole use to see if nurtec is better tolerated.       Vitamin D deficiency    Update levels on regular replacement      Relevant Orders   VITAMIN D 25 Hydroxy (Vit-D Deficiency, Fractures)   Chronically dry eyes, bilateral - Primary    Seeing [facility], [facility]nea clinic, and now has appt to establish with [facility] for entropion.       Trigeminal neuralgia    To try Nurtec regular dosing.       Neurotrophic keratitis of left eye    Seeing [facility], [facility]nea clinic, and now has appt to establish with [facility] for entropion.       Other Visit Diagnoses     Chronic fatigue       Relevant Orders   Vitamin B12        Meds ordered this encounter  Medications   NONFORMULARY OR COMPOUNDED ITEM    Sig: Hydroeye supplement - with fish oil, vitamin C, A, E, B6   Orders Placed This Encounter  Procedures   Vitamin B12   VITAMIN D 25 Hydroxy (Vit-D Deficiency, Fractures)     Patient Instructions  Reasonable to try pepcid '20mg'$  twice daily to see if Nurtec better tolerated. If not then try once daily omeprazole (Prilosec).  Labs today.  Good to see you today Return as needed or in 8 months for physical.   Follow up plan: Return in about 8 months (around 08/02/2022), or if symptoms worsen or fail to improve, for annual exam, prior fasting for blood work, medicare wellness visit.  Ria Bush, MD

## 2021-12-01 NOTE — Assessment & Plan Note (Signed)
Seeing [facility], [facility]nea clinic, and now has appt to establish with [facility] for entropion.

## 2021-12-01 NOTE — Assessment & Plan Note (Signed)
Stable period off daily PPI. Feels vitamin supplementation helped GI symptoms.

## 2021-12-01 NOTE — Patient Instructions (Addendum)
Reasonable to try pepcid '20mg'$  twice daily to see if Nurtec better tolerated. If not then try once daily omeprazole (Prilosec).  Labs today.  Good to see you today Return as needed or in 8 months for physical.

## 2021-12-01 NOTE — Assessment & Plan Note (Signed)
Appreciate endo care.  

## 2021-12-01 NOTE — Assessment & Plan Note (Signed)
Ongoing headaches, sees pain clinic Dr Hardin Negus for this, on nurtec. Hopefully nurtec will also help Trigeminal Neuralgia pain.  Has had difficulty tolerating regular nurtec dosing.  Discussed regular pepcid vs regular omeprazole use to see if nurtec is better tolerated.

## 2021-12-01 NOTE — Assessment & Plan Note (Signed)
To try Nurtec regular dosing.

## 2022-01-03 ENCOUNTER — Encounter: Payer: Self-pay | Admitting: Family Medicine

## 2022-01-03 ENCOUNTER — Other Ambulatory Visit: Payer: Self-pay | Admitting: Family Medicine

## 2022-01-03 NOTE — Telephone Encounter (Signed)
Last filled 11-03-21 #60 Last OV 12-01-21 No Future OV [facility]

## 2022-01-04 MED ORDER — LORAZEPAM 1 MG PO TABS: 0.5000 mg | ORAL_TABLET | Freq: Two times a day (BID) | ORAL | 0 refills | Status: DC | PRN

## 2022-01-04 NOTE — Telephone Encounter (Signed)
ERx 

## 2022-01-08 NOTE — Telephone Encounter (Signed)
Plz offer appt for 4:30pm today

## 2022-01-08 NOTE — Telephone Encounter (Signed)
Please offer appt for same day slot on Wednesday thanks.

## 2022-01-09 NOTE — Telephone Encounter (Signed)
Spoke with pt scheduling OV at 2:00. Pt expresses her thanks.

## 2022-01-10 ENCOUNTER — Encounter: Payer: Self-pay | Admitting: Family Medicine

## 2022-01-10 ENCOUNTER — Ambulatory Visit: Payer: Medicare Other | Admitting: Family Medicine

## 2022-01-10 VITALS — BP 134/82 | HR 86 | Temp 97.2°F | Ht 67.5 in | Wt 176.1 lb

## 2022-01-10 DIAGNOSIS — R200 Anesthesia of skin: Secondary | ICD-10-CM

## 2022-01-10 DIAGNOSIS — K582 Mixed irritable bowel syndrome: Secondary | ICD-10-CM

## 2022-01-10 DIAGNOSIS — H168 Other keratitis: Secondary | ICD-10-CM

## 2022-01-10 DIAGNOSIS — R519 Headache, unspecified: Secondary | ICD-10-CM | POA: Diagnosis not present

## 2022-01-10 DIAGNOSIS — M26622 Arthralgia of left temporomandibular joint: Secondary | ICD-10-CM

## 2022-01-10 DIAGNOSIS — H04123 Dry eye syndrome of bilateral lacrimal glands: Secondary | ICD-10-CM

## 2022-01-10 DIAGNOSIS — R1030 Lower abdominal pain, unspecified: Secondary | ICD-10-CM | POA: Diagnosis not present

## 2022-01-10 DIAGNOSIS — G43109 Migraine with aura, not intractable, without status migrainosus: Secondary | ICD-10-CM | POA: Diagnosis not present

## 2022-01-10 DIAGNOSIS — G500 Trigeminal neuralgia: Secondary | ICD-10-CM

## 2022-01-10 MED ORDER — KETOROLAC TROMETHAMINE 30 MG/ML IJ SOLN
30.0000 mg | Freq: Once | INTRAMUSCULAR | Status: AC
  Administered 2022-01-10: 30 mg via INTRAMUSCULAR

## 2022-01-10 NOTE — Patient Instructions (Addendum)
Toradol '30mg'$  shot today  Labwork today including test for inflammation.  Try holding Nurtec to see if GI symptoms improve. If not, call [facility] to schedule appointment for ongoing mucous and abdominal symptoms.

## 2022-01-10 NOTE — Progress Notes (Unsigned)
Patient ID: Karen Aguilar, female    DOB: [birth year], 57 y.o.   MRN: [id number]  This visit was conducted in person.  BP 134/82   Pulse 86   Temp (!) 97.2 F (36.2 C) (Temporal)   Ht 5' 7.5" (1.715 m)   Wt 176 lb 2 oz (79.9 kg)   LMP 10/04/2014 Comment: per labs  SpO2 97%   BMI 27.18 kg/m    CC: migraine and abdominal pain Subjective:   HPI: Karen Aguilar is a 57 y.o. female presenting on 01/10/2022 for Migraine (C/o severe migraine. Requests Toradol inj. )   Worsening migraines over the past 2 weeks, + photo/phonophobia, nausea, activity limiting. Occ blurry vision with headaches comes and goes. More recently having bitemporal headache which is new, painful to the touch, as well as bilateral retro-orbital headache. She previously had negative evaluation for temporal arteritis (L temporal artery biopsy 09/2019 by Dr Windell Moment). Also different from her trigeminal neuralgia pain. Tension headache from significant period of time on computer helping daughter plan wedding may have triggered these worsening headaches.   Recent L TMJ stiffness - trouble closing jaw when this happened, sore for several days. She did increase tylenol at that time.   H/o complicated migraines.  Last saw neurology Dr Melrose Nakayama 02/2020.  Has discussed migraine treatment with pain clinic Dr Hardin Negus - tried nurtec which decreased intensity of headache but did not fully resolve, and caused nausea so she started daily antireflux therapy without benefit.   Currently off pepcid, off regular ppi. She takes carafate PRN.  Last week took several doses of nurtec without benefit due to worsening headaches.  Last toradol injections 07/2021 x2 with benefit.   Over the past 1+ week has noted sharp stabbing bilateral lower abdominal pains followed by increased gassiness and mucous in the stool - mucous is this ongoing. Stools are small and loose. No pale stools or increased stool buoyancy.  No diet changes. No  fevers/chills. No nausea/vomiting.  Took cystex plus x1 with benefit.  She's started taking mylanta at night as well as liquid imodium.  No urinary symptoms - dysuria, urgency, frequency, blood in urine.   COLONOSCOPY 03/2019 - 1 polyp, biopsies taken ([place]) ESOPHAGOGASTRODUODENOSCOPY 08/31/2020 - biopsy with reactive gastropathy [facility])     Relevant past medical, surgical, family and social history reviewed and updated as indicated. Interim medical history since our last visit reviewed. Allergies and medications reviewed and updated. Outpatient Medications Prior to Visit  Medication Sig Dispense Refill   Ascorbic Acid (VITAMIN C PO) Take 1 tablet by mouth as needed (for immune system support).      baclofen (LIORESAL) 10 MG tablet Take 0.5 tablets (5 mg total) by mouth 3 (three) times daily as needed.     calcium carbonate (TUMS EX) 750 MG chewable tablet Chew 2-3 tablets by mouth 2 (two) times daily as needed.     camphor-menthol (SARNA) lotion Apply 1 application topically at bedtime as needed for itching.      Carboxymethylcellulose Sodium (EYE DROPS OP) Apply to eye. PRGF eye drops. 1 drop 4-6 times a day in left eye     Cholecalciferol (VITAMIN D) 2000 units CAPS Take 1 capsule (2,000 Units total) by mouth daily. 30 capsule    diclofenac sodium (VOLTAREN) 1 % GEL APPLY THIN LAYER EXTERNALLY TO THE AFFECTED AREA UP TO FOUR TIMES DAILY AS NEEDED 300 g 0   docusate sodium (COLACE) 100 MG capsule Take 100 mg by mouth 2 (two)  times daily. As needed     Loperamide HCl (IMODIUM PO) Take by mouth as needed. Takes liquid form     LORazepam (ATIVAN) 1 MG tablet Take 0.5-1 tablets (0.5-1 mg total) by mouth 2 (two) times daily as needed for anxiety. 60 tablet 0   NONFORMULARY OR COMPOUNDED ITEM Hydroeye supplement - with fish oil, vitamin C, A, E, B6     NUCYNTA ER 250 MG TB12 SMARTSIG:1 Tablet(s) By Mouth Every 12 Hours     omeprazole (PRILOSEC) 40 MG capsule Take 40 mg by mouth 2 (two) times  daily. As needed     ondansetron (ZOFRAN-ODT) 4 MG disintegrating tablet Take 1 tablet (4 mg total) by mouth every 8 (eight) hours as needed for nausea or vomiting. 20 tablet 0   Potassium 99 MG TABS Take 1 tablet (99 mg total) by mouth daily.  0   Rimegepant Sulfate (NURTEC) 75 MG TBDP Take by mouth.     sucralfate (CARAFATE) 1 g tablet Take 1 g by mouth 4 (four) times daily. As needed     thyroid (ARMOUR) 90 MG tablet Take 1 tablet (90 mg total) by mouth daily. One day a week take 1/2 tablet     TYRVAYA 0.03 MG/ACT SOLN Place 1 spray into both nostrils 2 (two) times daily.     No facility-administered medications prior to visit.     Per HPI unless specifically indicated in ROS section below Review of Systems  Objective:  BP 134/82   Pulse 86   Temp (!) 97.2 F (36.2 C) (Temporal)   Ht 5' 7.5" (1.715 m)   Wt 176 lb 2 oz (79.9 kg)   LMP 10/04/2014 Comment: per labs  SpO2 97%   BMI 27.18 kg/m   Wt Readings from Last 3 Encounters:  01/10/22 176 lb 2 oz (79.9 kg)  12/01/21 173 lb 3.2 oz (78.6 kg)  10/04/21 172 lb 2 oz (78.1 kg)      Physical Exam Vitals and nursing note reviewed.  Constitutional:      Appearance: She is not ill-appearing.  HENT:     Head: Normocephalic and atraumatic.     Comments:  Tenderness to palpation bitemporally Mild TMJ clicking on left without popping or crepitus    Mouth/Throat:     Mouth: Mucous membranes are moist.     Pharynx: Oropharynx is clear. No oropharyngeal exudate or posterior oropharyngeal erythema.  Eyes:     Extraocular Movements: Extraocular movements intact.     Pupils: Pupils are equal, round, and reactive to light.  Cardiovascular:     Rate and Rhythm: Normal rate and regular rhythm.     Pulses: Normal pulses.     Heart sounds: Normal heart sounds. No murmur heard. Pulmonary:     Effort: Pulmonary effort is normal. No respiratory distress.     Breath sounds: Normal breath sounds. No wheezing, rhonchi or rales.  Abdominal:      General: Bowel sounds are normal. There is no distension.     Palpations: Abdomen is soft. There is no mass.     Tenderness: There is abdominal tenderness (mild-mod) in the epigastric area. There is no right CVA tenderness, left CVA tenderness, guarding or rebound. Negative signs include Murphy's sign.     Hernia: No hernia is present.  Musculoskeletal:     Right lower leg: No edema.     Left lower leg: No edema.  Skin:    General: Skin is warm and dry.     Findings:  No rash.  Neurological:     Mental Status: She is alert.     Cranial Nerves: Cranial nerves 2-12 are intact.     Sensory: Sensation is intact.     Motor: Motor function is intact.     Comments:  CN 2-12 intact EOMI FTN intact  Psychiatric:        Mood and Affect: Mood normal.        Behavior: Behavior normal.       Results for orders placed or performed in visit on 01/10/22  Comprehensive metabolic panel  Result Value Ref Range   Sodium 139 135 - 145 mEq/L   Potassium 4.4 3.5 - 5.1 mEq/L   Chloride 99 96 - 112 mEq/L   CO2 35 (H) 19 - 32 mEq/L   Glucose, Bld 99 70 - 99 mg/dL   BUN 16 6 - 23 mg/dL   Creatinine, Ser 0.89 0.40 - 1.20 mg/dL   Total Bilirubin 0.4 0.2 - 1.2 mg/dL   Alkaline Phosphatase 72 39 - 117 U/L   AST 13 0 - 37 U/L   ALT 12 0 - 35 U/L   Total Protein 6.5 6.0 - 8.3 g/dL   Albumin 4.1 3.5 - 5.2 g/dL   GFR 72.85 >60.00 mL/min   Calcium 9.4 8.4 - 10.5 mg/dL  Sedimentation rate  Result Value Ref Range   Sed Rate 20 0 - 30 mm/hr  CBC with Differential/Platelet  Result Value Ref Range   WBC 5.8 4.0 - 10.5 K/uL   RBC 4.67 3.87 - 5.11 Mil/uL   Hemoglobin 13.5 12.0 - 15.0 g/dL   HCT 40.0 36.0 - 46.0 %   MCV 85.8 78.0 - 100.0 fl   MCHC 33.7 30.0 - 36.0 g/dL   RDW 13.1 11.5 - 15.5 %   Platelets 330.0 150.0 - 400.0 K/uL   Neutrophils Relative % 64.4 43.0 - 77.0 %   Lymphocytes Relative 29.1 12.0 - 46.0 %   Monocytes Relative 6.3 3.0 - 12.0 %   Eosinophils Relative 0.1 0.0 - 5.0 %    Basophils Relative 0.1 0.0 - 3.0 %   Neutro Abs 3.7 1.4 - 7.7 K/uL   Lymphs Abs 1.7 0.7 - 4.0 K/uL   Monocytes Absolute 0.4 0.1 - 1.0 K/uL   Eosinophils Absolute 0.0 0.0 - 0.7 K/uL   Basophils Absolute 0.0 0.0 - 0.1 K/uL    Assessment & Plan:   Lower abdominal pain Assessment & Plan: More recently having sharp lower abdominal pain associated with mucous in stool. She is UTD colonoscopy with latest 2021 which was largely normal.  ?med related - she's recently been taking nurtec more regularly.  Check labwork today. Continue carafate and mylanta. Discussed [facility] f/u if not improving.   Orders: -     Comprehensive metabolic panel -     CBC with Differential/Platelet  Temporal pain -     Sedimentation rate  Complicated migraine Assessment & Plan: H/o complicated migraines, with acute worsening. Non-focal neurological exam today. Treat with toradol IM '30mg'$ . Pt declined phenergan IM as not currently with nausea. Given these headaches are somewhat different from typical migraine symptoms (with intermittent blurry vision, bitemporal headache), check ESR to evaluate for temporal arteritis.  Known trigeminal neuralgia likely contributes to headaches.   Orders: -     Ketorolac Tromethamine  Chronically dry eyes, bilateral  Irritable bowel syndrome with both constipation and diarrhea Assessment & Plan: H/o severe IBS, ?contributing to GI symptoms and mucous in stool.  Left facial numbness Assessment & Plan: Chronic issue thought related to trigeminal neuralgia.    Neurotrophic keratitis of left eye Assessment & Plan: Appreciate [facility].  Most recently seeing ocular plastic surgery for entropion   Trigeminal neuralgia  TMJ tenderness, left Assessment & Plan: Discussed isometric TMJ exercises, warm compress.     Meds ordered this encounter  Medications   ketorolac (TORADOL) 30 MG/ML injection 30 mg   Orders Placed This Encounter  Procedures   Comprehensive  metabolic panel   Sedimentation rate   CBC with Differential/Platelet    Patient Instructions  Toradol '30mg'$  shot today  Labwork today including test for inflammation.  Try holding Nurtec to see if GI symptoms improve. If not, call [facility] to schedule appointment for ongoing mucous and abdominal symptoms.    Follow up plan: Return if symptoms worsen or fail to improve.  Ria Bush, MD

## 2022-01-11 ENCOUNTER — Encounter: Payer: Self-pay | Admitting: Family Medicine

## 2022-01-11 DIAGNOSIS — M26622 Arthralgia of left temporomandibular joint: Secondary | ICD-10-CM | POA: Insufficient documentation

## 2022-01-11 LAB — COMPREHENSIVE METABOLIC PANEL
ALT: 12 U/L (ref 0–35)
AST: 13 U/L (ref 0–37)
Albumin: 4.1 g/dL (ref 3.5–5.2)
Alkaline Phosphatase: 72 U/L (ref 39–117)
BUN: 16 mg/dL (ref 6–23)
CO2: 35 mEq/L — ABNORMAL HIGH (ref 19–32)
Calcium: 9.4 mg/dL (ref 8.4–10.5)
Chloride: 99 mEq/L (ref 96–112)
Creatinine, Ser: 0.89 mg/dL (ref 0.40–1.20)
GFR: 72.85 mL/min (ref >60.00)
Glucose, Bld: 99 mg/dL (ref 70–99)
Potassium: 4.4 mEq/L (ref 3.5–5.1)
Sodium: 139 mEq/L (ref 135–145)
Total Bilirubin: 0.4 mg/dL (ref 0.2–1.2)
Total Protein: 6.5 g/dL (ref 6.0–8.3)

## 2022-01-11 LAB — CBC WITH DIFFERENTIAL/PLATELET
Basophils Absolute: 0 10*3/uL (ref 0.0–0.1)
Basophils Relative: 0.1 % (ref 0.0–3.0)
Eosinophils Absolute: 0 10*3/uL (ref 0.0–0.7)
Eosinophils Relative: 0.1 % (ref 0.0–5.0)
HCT: 40 % (ref 36.0–46.0)
Hemoglobin: 13.5 g/dL (ref 12.0–15.0)
Lymphocytes Relative: 29.1 % (ref 12.0–46.0)
Lymphs Abs: 1.7 10*3/uL (ref 0.7–4.0)
MCHC: 33.7 g/dL (ref 30.0–36.0)
MCV: 85.8 fl (ref 78.0–100.0)
Monocytes Absolute: 0.4 10*3/uL (ref 0.1–1.0)
Monocytes Relative: 6.3 % (ref 3.0–12.0)
Neutro Abs: 3.7 10*3/uL (ref 1.4–7.7)
Neutrophils Relative %: 64.4 % (ref 43.0–77.0)
Platelets: 330 10*3/uL (ref 150.0–400.0)
RBC: 4.67 Mil/uL (ref 3.87–5.11)
RDW: 13.1 % (ref 11.5–15.5)
WBC: 5.8 10*3/uL (ref 4.0–10.5)

## 2022-01-11 LAB — SEDIMENTATION RATE: Sed Rate: 20 mm/hr (ref 0–30)

## 2022-01-11 NOTE — Assessment & Plan Note (Signed)
H/o severe IBS, ?contributing to GI symptoms and mucous in stool.

## 2022-01-11 NOTE — Assessment & Plan Note (Addendum)
Chronic issue thought related to trigeminal neuralgia.

## 2022-01-11 NOTE — Assessment & Plan Note (Signed)
More recently having sharp lower abdominal pain associated with mucous in stool. She is UTD colonoscopy with latest 2021 which was largely normal.  ?med related - she's recently been taking nurtec more regularly.  Check labwork today. Continue carafate and mylanta. Discussed [facility] f/u if not improving.

## 2022-01-11 NOTE — Assessment & Plan Note (Signed)
Discussed isometric TMJ exercises, warm compress.

## 2022-01-11 NOTE — Assessment & Plan Note (Addendum)
Appreciate [facility].  Most recently seeing ocular plastic surgery for entropion

## 2022-01-11 NOTE — Assessment & Plan Note (Signed)
H/o complicated migraines, with acute worsening. Non-focal neurological exam today. Treat with toradol IM '30mg'$ . Pt declined phenergan IM as not currently with nausea. Given these headaches are somewhat different from typical migraine symptoms (with intermittent blurry vision, bitemporal headache), check ESR to evaluate for temporal arteritis.  Known trigeminal neuralgia likely contributes to headaches.

## 2022-02-21 ENCOUNTER — Encounter: Payer: Self-pay | Admitting: Family Medicine

## 2022-02-21 ENCOUNTER — Telehealth: Payer: Self-pay

## 2022-02-21 ENCOUNTER — Ambulatory Visit: Payer: Medicare Other | Admitting: Family Medicine

## 2022-02-21 VITALS — BP 120/80 | HR 102 | Temp 97.7°F | Ht 67.5 in | Wt 177.5 lb

## 2022-02-21 DIAGNOSIS — R051 Acute cough: Secondary | ICD-10-CM | POA: Diagnosis not present

## 2022-02-21 DIAGNOSIS — R0602 Shortness of breath: Secondary | ICD-10-CM

## 2022-02-21 DIAGNOSIS — R062 Wheezing: Secondary | ICD-10-CM | POA: Diagnosis not present

## 2022-02-21 MED ORDER — DOXYCYCLINE HYCLATE 100 MG PO TABS: 100.0000 mg | ORAL_TABLET | Freq: Two times a day (BID) | ORAL | 0 refills | Status: DC

## 2022-02-21 MED ORDER — ALBUTEROL SULFATE HFA 108 (90 BASE) MCG/ACT IN AERS: 2.0000 | INHALATION_SPRAY | Freq: Four times a day (QID) | RESPIRATORY_TRACT | 0 refills | Status: DC | PRN

## 2022-02-21 MED ORDER — HYDROCODONE BIT-HOMATROP MBR 5-1.5 MG/5ML PO SOLN: 5.0000 mL | Freq: Three times a day (TID) | ORAL | 0 refills | Status: DC | PRN

## 2022-02-21 NOTE — Telephone Encounter (Signed)
I spoke with pt after receiving the access nurse note to see which hospital ED pt was going to. Access nurse disposition was to go to ED for eval and per note pt agreed to go to ED. Pt said that was not what the nurse said and I asked pt if she was having CP or SOB and pt said she was not having CP but she did have SOB. I asked pt if she had fever, wheezing and when did symptoms start. Pt said she had already answered these questions and when pt talks it makes her cough which makes SOB worse. Pt began to cough. After coughing episode pt said to let her keep appt she already has scheduled today at 3:30. I apologized to pt and she said that no one had talked to her about going to ED except me. Pt was coughing and very SOB. I again apologized and I did not want to upset pt; I gave pt ED precautions and advised that I would leave her appt as previously scheduled today at 3:40. Pt said thank you in between coughing and sounding SOB. Sending note to Dr Lorelei Pont, [place] [facility] and will speak with Butch Penny CMA.

## 2022-02-21 NOTE — Progress Notes (Signed)
Karen Abplanalp T. [name], MD, [place] at [facility] [place], [address]  Phone: [phone]  FAX: [place] - 57 y.o. female  MRN [id number]  Date of Birth: 12/22/1965  Date: 02/21/2022  PCP: Ria Bush, MD  Referral: Ria Bush, MD  Chief Complaint  Patient presents with   Cough    X couple of days   Shortness of Breath        Wheezing   Subjective:   Karen Aguilar is a 57 y.o. very pleasant female patient with Body mass index is 27.39 kg/m. who presents with the following:  Has had a cough for the last couple of days.  Coughed all night, and then she was coughing again.  A foamy mucous, and it got worse and worse.  Took some daqyuil this morning, and she could not stop coughing.  Threw up this morning about three times from coughing.   Has had some bronchitis in the past.  Would get bronchitis about once a year every year.    She feels short of breath, she is wheezing.  She has been wheezing throughout the night, she is wheezing today this morning as well.  She denies having a fever.  She denies any diarrhea.  While she did throw up, she is generally not very nauseous.  She is not a smoker, she has no known pulmonary disease  B wheezing  Review of Systems is noted in the HPI, as appropriate  Objective:   BP 120/80   Pulse (!) 102   Temp 97.7 F (36.5 C) (Temporal)   Ht 5' 7.5" (1.715 m)   Wt 177 lb 8 oz (80.5 kg)   LMP 10/04/2014 Comment: per labs  SpO2 97%   BMI 27.39 kg/m    Gen: WDWN, NAD. Globally Non-toxic HEENT: Throat clear, w/o exudate, R TM clear, L TM - good landmarks, No fluid present. rhinnorhea.  MMM Frontal sinuses: NT Max sinuses: NT NECK: Anterior cervical  LAD is absent CV: RRR, No M/G/R, cap refill <2 sec PULM: Breathing comfortably in no respiratory distress. + B wheezing, crackles, rhonchi   Laboratory and Imaging Data:  Assessment and  Plan:     ICD-10-CM   1. Acute cough  R05.1 Respiratory virus panel    COVID-19, Flu A+B and RSV    2. SOB (shortness of breath)  R06.02 Respiratory virus panel    COVID-19, Flu A+B and RSV    3. Wheezing  R06.2      Acute bronchitis versus COVID versus RSV versus less likely influenza.  She is certainly actively wheezing.  I am going to place her on antibiotics, give her an albuterol inhaler as well as some strong cough medication.  If she does come back positive for COVID-19, then we will convert her to Paxlovid and discontinue her antibiotics.  Medication Management during today's office visit: Meds ordered this encounter  Medications   doxycycline (VIBRA-TABS) 100 MG tablet    Sig: Take 1 tablet (100 mg total) by mouth 2 (two) times daily.    Dispense:  20 tablet    Refill:  0   albuterol (VENTOLIN HFA) 108 (90 Base) MCG/ACT inhaler    Sig: Inhale 2 puffs into the lungs every 6 (six) hours as needed for wheezing or shortness of breath.    Dispense:  8 g    Refill:  0   HYDROcodone bit-homatropine (HYCODAN) 5-1.5 MG/5ML syrup  Sig: Take 5 mLs by mouth every 8 (eight) hours as needed for cough.    Dispense:  120 mL    Refill:  0   There are no discontinued medications.  Orders placed today for conditions managed today: Orders Placed This Encounter  Procedures   Respiratory virus panel   COVID-19, Flu A+B and RSV    Disposition: No follow-ups on file.  [employer] One speech-to-text software was used for transcription in this dictation.  Possible transcriptional errors can occur using Editor, commissioning.   Signed,  Maud Deed. Keiaira Donlan, MD   Outpatient Encounter Medications as of 02/21/2022  Medication Sig   albuterol (VENTOLIN HFA) 108 (90 Base) MCG/ACT inhaler Inhale 2 puffs into the lungs every 6 (six) hours as needed for wheezing or shortness of breath.   Ascorbic Acid (VITAMIN C PO) Take 1 tablet by mouth as needed (for immune system support).    baclofen  (LIORESAL) 10 MG tablet Take 0.5 tablets (5 mg total) by mouth 3 (three) times daily as needed.   calcium carbonate (TUMS EX) 750 MG chewable tablet Chew 2-3 tablets by mouth 2 (two) times daily as needed.   camphor-menthol (SARNA) lotion Apply 1 application topically at bedtime as needed for itching.    Carboxymethylcellulose Sodium (EYE DROPS OP) Apply to eye. PRGF eye drops. 1 drop 4-6 times a day in left eye   Cholecalciferol (VITAMIN D) 2000 units CAPS Take 1 capsule (2,000 Units total) by mouth daily.   diclofenac sodium (VOLTAREN) 1 % GEL APPLY THIN LAYER EXTERNALLY TO THE AFFECTED AREA UP TO FOUR TIMES DAILY AS NEEDED   docusate sodium (COLACE) 100 MG capsule Take 100 mg by mouth 2 (two) times daily. As needed   doxycycline (VIBRA-TABS) 100 MG tablet Take 1 tablet (100 mg total) by mouth 2 (two) times daily.   HYDROcodone bit-homatropine (HYCODAN) 5-1.5 MG/5ML syrup Take 5 mLs by mouth every 8 (eight) hours as needed for cough.   Loperamide HCl (IMODIUM PO) Take by mouth as needed. Takes liquid form   LORazepam (ATIVAN) 1 MG tablet Take 0.5-1 tablets (0.5-1 mg total) by mouth 2 (two) times daily as needed for anxiety.   NONFORMULARY OR COMPOUNDED ITEM Hydroeye supplement - with fish oil, vitamin C, A, E, B6   NUCYNTA ER 250 MG TB12 SMARTSIG:1 Tablet(s) By Mouth Every 12 Hours   omeprazole (PRILOSEC) 40 MG capsule Take 40 mg by mouth 2 (two) times daily. As needed   ondansetron (ZOFRAN-ODT) 4 MG disintegrating tablet Take 1 tablet (4 mg total) by mouth every 8 (eight) hours as needed for nausea or vomiting.   Potassium 99 MG TABS Take 1 tablet (99 mg total) by mouth daily.   Rimegepant Sulfate (NURTEC) 75 MG TBDP Take by mouth.   sucralfate (CARAFATE) 1 g tablet Take 1 g by mouth 4 (four) times daily. As needed   thyroid (ARMOUR) 90 MG tablet Take 1 tablet (90 mg total) by mouth daily. One day a week take 1/2 tablet   TYRVAYA 0.03 MG/ACT SOLN Place 1 spray into both nostrils 2 (two) times  daily.   No facility-administered encounter medications on file as of 02/21/2022.

## 2022-02-21 NOTE — Telephone Encounter (Signed)
[facility] - Client TELEPHONE ADVICE RECORD [facility] Patient Name: Karen Aguilar Gender: Female DOB: 1965/06/27 Age: 57 Y 9 M 20 D Return Phone Number: [phone] (Primary) Address: City/ State/ Zip: [place] [address] Client [facility] - Client Client Site [place] Provider Ria Bush - MD Contact Type Call Who Is Calling Patient / Member / Family / Caregiver Call Type Triage / Clinical Relationship To Patient Self Return Phone Number [phone] (Primary) Chief Complaint BREATHING - shortness of breath or sounds breathless Reason for Call Symptomatic / Request for [name] states she is having a cough and issues with her breathing. The office did schedule her appt today at 330. She is having trouble breathing. She also threw up after a coughing spell this morning. Translation No Nurse Assessment Nurse: Ysidro Evert, RN, Levada Dy Date/Time (Eastern Time): 02/21/2022 1:53:22 PM Confirm and document reason for call. If symptomatic, describe symptoms. ---Caller states she has a cough and she has some shortness of breath and dizziness that started a couple days ago. No fever Does the patient have any new or worsening symptoms? ---Yes Will a triage be completed? ---Yes Related visit to physician within the last 2 weeks? ---No Does the PT have any chronic conditions? (i.e. diabetes, asthma, this includes High risk factors for pregnancy, etc.) ---No Is this a behavioral health or substance abuse call? ---No Guidelines Guideline Title Affirmed Question Affirmed Notes Nurse Date/Time (Eastern Time) Breathing Difficulty Patient sounds very sick or weak to the triager Ysidro Evert, RN, Levada Dy 02/21/2022 1:54:51 PM Disp. Time Eilene Ghazi Time) Disposition Final User 02/21/2022 1:51:19 PM Send to Urgent Lyda Jester 02/21/2022 1:59:58 PM Go to ED  Now (or PCP triage) Yes Ysidro Evert, RN, Levada Dy PLEASE NOTE: All timestamps contained within this report are represented as [place] Standard Time. CONFIDENTIALTY NOTICE: This fax transmission is intended only for the addressee. It contains information that is legally privileged, confidential or otherwise protected from use or disclosure. If you are not the intended recipient, you are strictly prohibited from reviewing, disclosing, copying using or disseminating any of this information or taking any action in reliance on or regarding this information. If you have received this fax in error, please notify us immediately by telephone so that we can arrange for its return to [place]. Phone: [phone], Toll-Free: [phone], Fax: [phone] Page: 2 of 2 Call Id: [id number] Final Disposition 02/21/2022 1:59:58 PM Go to ED Now (or PCP triage) Yes Ysidro Evert, RN, Marin Shutter Disagree/Comply Comply Caller Understands Yes PreDisposition Did not know what to do Care Advice Given Per Guideline GO TO ED NOW (OR PCP TRIAGE): ANOTHER ADULT SHOULD DRIVE: * It is better and safer if another adult drives instead of you. CARE ADVICE given per Breathing Difficulty (Adult) guideline. Referrals REFERRED TO PCP OFFIC

## 2022-02-23 LAB — COVID-19, FLU A+B AND RSV
Influenza A, NAA: NOT DETECTED
Influenza B, NAA: NOT DETECTED
RSV, NAA: NOT DETECTED
SARS-CoV-2, NAA: NOT DETECTED

## 2022-03-01 ENCOUNTER — Other Ambulatory Visit: Payer: Self-pay | Admitting: Family Medicine

## 2022-03-01 DIAGNOSIS — F411 Generalized anxiety disorder: Secondary | ICD-10-CM

## 2022-03-02 MED ORDER — LORAZEPAM 1 MG PO TABS: 0.5000 mg | ORAL_TABLET | Freq: Two times a day (BID) | ORAL | 0 refills | Status: DC | PRN

## 2022-03-02 NOTE — Telephone Encounter (Signed)
Name of Medication: Lorazepam Name of Pharmacy: [facility] or Written Date and Quantity: 01/04/22, #60 Last Office Visit and Type: 01/10/22, migraine Next Office Visit and Type: none Last Controlled Substance Agreement Date: 06/10/13 Last UDS: scanned

## 2022-03-02 NOTE — Telephone Encounter (Signed)
Printed and in [employer].

## 2022-03-05 NOTE — Telephone Encounter (Signed)
Faxed printed rx to [facility].

## 2022-04-27 ENCOUNTER — Other Ambulatory Visit: Payer: Self-pay | Admitting: Family Medicine

## 2022-04-27 ENCOUNTER — Encounter: Payer: Self-pay | Admitting: Family Medicine

## 2022-04-27 DIAGNOSIS — F411 Generalized anxiety disorder: Secondary | ICD-10-CM

## 2022-04-27 MED ORDER — LORAZEPAM 1 MG PO TABS
0.5000 mg | ORAL_TABLET | Freq: Two times a day (BID) | ORAL | 0 refills | Status: DC | PRN
Start: 2022-04-27 — End: 2022-06-23

## 2022-04-27 MED ORDER — LORAZEPAM 1 MG PO TABS
0.5000 mg | ORAL_TABLET | Freq: Two times a day (BID) | ORAL | 0 refills | Status: DC | PRN
Start: 2022-04-27 — End: 2022-04-27

## 2022-04-27 NOTE — Telephone Encounter (Signed)
ERx 

## 2022-04-27 NOTE — Addendum Note (Signed)
Addended by: Eustaquio Boyden on: 04/27/2022 05:01 PM   Modules accepted: Orders

## 2022-04-27 NOTE — Telephone Encounter (Signed)
Name of Medication: Lorazepam Name of Pharmacy: [facility] Last Fill or Written Date and Quantity: 03/02/22, #60 Last Office Visit and Type: 01/10/22, migraine Next Office Visit and Type: none Last Controlled Substance Agreement Date: 06/10/13 Last UDS: scanned

## 2022-04-27 NOTE — Telephone Encounter (Signed)
Duplicate request. (See 04/27/22 pt msg.)

## 2022-05-22 ENCOUNTER — Ambulatory Visit: Payer: Medicare Other | Admitting: Family Medicine

## 2022-05-30 ENCOUNTER — Ambulatory Visit: Payer: Medicare Other | Admitting: Family Medicine

## 2022-05-30 ENCOUNTER — Encounter: Payer: Self-pay | Admitting: Family Medicine

## 2022-05-30 VITALS — BP 136/82 | HR 87 | Temp 97.3°F | Ht 67.5 in | Wt 177.4 lb

## 2022-05-30 DIAGNOSIS — R1030 Lower abdominal pain, unspecified: Secondary | ICD-10-CM

## 2022-05-30 DIAGNOSIS — R109 Unspecified abdominal pain: Secondary | ICD-10-CM | POA: Diagnosis not present

## 2022-05-30 DIAGNOSIS — K582 Mixed irritable bowel syndrome: Secondary | ICD-10-CM | POA: Diagnosis not present

## 2022-05-30 DIAGNOSIS — M67472 Ganglion, left ankle and foot: Secondary | ICD-10-CM | POA: Diagnosis not present

## 2022-05-30 DIAGNOSIS — R1011 Right upper quadrant pain: Secondary | ICD-10-CM | POA: Diagnosis not present

## 2022-05-30 LAB — POC URINALSYSI DIPSTICK (AUTOMATED)
Bilirubin, UA: NEGATIVE
Blood, UA: NEGATIVE
Glucose, UA: NEGATIVE
Ketones, UA: NEGATIVE
Leukocytes, UA: NEGATIVE
Nitrite, UA: NEGATIVE
Protein, UA: NEGATIVE
Spec Grav, UA: 1.015 (ref 1.010–1.025)
Urobilinogen, UA: 0.2 E.U./dL
pH, UA: 5 (ref 5.0–8.0)

## 2022-05-30 NOTE — Patient Instructions (Signed)
Labs today Can try callus pad or bandaid to tender nodule under left great toe I think GI symptoms are coming from residual irritation after recent viral gastroenteritis. May try activia probiotic yogurt for a few days with pepcid 20mg  twice daily and if no better then take omeprazole 40mg  daily for 2  weeks.  If no better, may also trial off Qlipta for 1-2 weeks to see if GI symptoms improve.

## 2022-05-30 NOTE — Progress Notes (Signed)
Ph: [phone] Fax: [phone]   Patient ID: BRICIA FAHL, female    DOB: 03-Jan-1965, 57 y.o.   MRN: [id number]  This visit was conducted in person.  BP 136/82   Pulse 87   Temp (!) 97.3 F (36.3 C) (Temporal)   Ht 5' 7.5" (1.715 m)   Wt 177 lb 6 oz (80.5 kg)   LMP 10/04/2014 Comment: per labs  SpO2 97%   BMI 27.37 kg/m   BP Readings from Last 3 Encounters:  05/30/22 136/82  02/21/22 120/80  01/10/22 134/82    CC: check foot, recent GI illness  Subjective:   HPI: KEISHIA SHERBURNE is a 57 y.o. female presenting on 05/30/2022 for Cyst (C/o cyst on L foot. Noticed 1-2 mos ago. Area is sometimes painful. Also, c/o recent elevated BP readings, fatigue, RUQ abd pain radiating around to back. Sxs started 05/27/22. States she has not felt well after having a bout of vomiting and elevated HR. Pt accompanied by husband, Ben. )   1-2 months of left foot pain lateral great toe - ?ganglion cyst. Tender to palpation or with tight fitting shoes. Denies inciting trauma/injury or falls.   3d h/o dull epigastric to RUQ abd pain with radiation to right lower back, increased gassiness, indigestion, belching. May have started after stomach virus 05/20/2022 - nausea, vomiting and diarrhea. Many family members were sick. Episode of tachycardia, fever, self treated at home. She's restarted carafate 1gm 30 min before meals (BID). Also takes mylanta at night. Has not been on omeprazole for the past year. Noticing nausea after every meal. + GERD symptoms.   Notes fluctuating blood pressures over the past week as well - 113-183/80-100.   Several months of as sharp L groin pain and suprapubic vibration discomfort feeling - manages with heating pad with benefit. No vaginal bleeding.   Recently started on Qlipta 30mg  nightly for migraine prevention.      Relevant past medical, surgical, family and social history reviewed and updated as indicated. Interim medical history since our last visit  reviewed. Allergies and medications reviewed and updated. Outpatient Medications Prior to Visit  Medication Sig Dispense Refill   albuterol (VENTOLIN HFA) 108 (90 Base) MCG/ACT inhaler Inhale 2 puffs into the lungs every 6 (six) hours as needed for wheezing or shortness of breath. 8 g 0   Ascorbic Acid (VITAMIN C PO) Take 1 tablet by mouth as needed (for immune system support).      Atogepant (QULIPTA) 30 MG TABS daily.     baclofen (LIORESAL) 10 MG tablet Take 0.5 tablets (5 mg total) by mouth 3 (three) times daily as needed.     calcium carbonate (TUMS EX) 750 MG chewable tablet Chew 2-3 tablets by mouth 2 (two) times daily as needed.     camphor-menthol (SARNA) lotion Apply 1 application topically at bedtime as needed for itching.      Carboxymethylcellulose Sodium (EYE DROPS OP) Apply to eye. PRGF eye drops. 1 drop 4-6 times a day in left eye     Cholecalciferol (VITAMIN D) 2000 units CAPS Take 1 capsule (2,000 Units total) by mouth daily. 30 capsule    diclofenac sodium (VOLTAREN) 1 % GEL APPLY THIN LAYER EXTERNALLY TO THE AFFECTED AREA UP TO FOUR TIMES DAILY AS NEEDED 300 g 0   docusate sodium (COLACE) 100 MG capsule Take 100 mg by mouth 2 (two) times daily. As needed     doxycycline (VIBRA-TABS) 100 MG tablet Take 1 tablet (100 mg total)  by mouth 2 (two) times daily. 20 tablet 0   HYDROcodone bit-homatropine (HYCODAN) 5-1.5 MG/5ML syrup Take 5 mLs by mouth every 8 (eight) hours as needed for cough. 120 mL 0   Loperamide HCl (IMODIUM PO) Take by mouth as needed. Takes liquid form     LORazepam (ATIVAN) 1 MG tablet Take 0.5-1 tablets (0.5-1 mg total) by mouth 2 (two) times daily as needed for anxiety. 60 tablet 0   NONFORMULARY OR COMPOUNDED ITEM Hydroeye supplement - with fish oil, vitamin C, A, E, B6     NUCYNTA ER 250 MG TB12 SMARTSIG:1 Tablet(s) By Mouth Every 12 Hours     ondansetron (ZOFRAN-ODT) 4 MG disintegrating tablet Take 1 tablet (4 mg total) by mouth every 8 (eight) hours as  needed for nausea or vomiting. 20 tablet 0   Potassium 99 MG TABS Take 1 tablet (99 mg total) by mouth daily.  0   Rimegepant Sulfate (NURTEC) 75 MG TBDP Take by mouth.     sucralfate (CARAFATE) 1 g tablet Take 1 g by mouth 4 (four) times daily. As needed     thyroid (ARMOUR) 90 MG tablet Take 1 tablet (90 mg total) by mouth daily. One day a week take 1/2 tablet     TYRVAYA 0.03 MG/ACT SOLN Place 1 spray into both nostrils 2 (two) times daily.     omeprazole (PRILOSEC) 40 MG capsule Take 40 mg by mouth 2 (two) times daily. As needed     No facility-administered medications prior to visit.     Per HPI unless specifically indicated in ROS section below Review of Systems  Objective:  BP 136/82   Pulse 87   Temp (!) 97.3 F (36.3 C) (Temporal)   Ht 5' 7.5" (1.715 m)   Wt 177 lb 6 oz (80.5 kg)   LMP 10/04/2014 Comment: per labs  SpO2 97%   BMI 27.37 kg/m   Wt Readings from Last 3 Encounters:  05/30/22 177 lb 6 oz (80.5 kg)  02/21/22 177 lb 8 oz (80.5 kg)  01/10/22 176 lb 2 oz (79.9 kg)      Physical Exam Vitals and nursing note reviewed.  Constitutional:      Appearance: Normal appearance. She is not ill-appearing.  HENT:     Mouth/Throat:     Mouth: Mucous membranes are moist.     Pharynx: Oropharynx is clear. No oropharyngeal exudate or posterior oropharyngeal erythema.  Eyes:     Extraocular Movements: Extraocular movements intact.     Pupils: Pupils are equal, round, and reactive to light.  Cardiovascular:     Rate and Rhythm: Normal rate and regular rhythm.     Pulses: Normal pulses.     Heart sounds: Normal heart sounds. No murmur heard. Pulmonary:     Effort: Pulmonary effort is normal. No respiratory distress.     Breath sounds: Normal breath sounds. No wheezing, rhonchi or rales.  Abdominal:     General: Bowel sounds are normal. There is no distension.     Palpations: Abdomen is soft. There is no mass.     Tenderness: There is abdominal tenderness (mild- mod)  in the right upper quadrant, epigastric area, suprapubic area and left lower quadrant. There is no right CVA tenderness, left CVA tenderness, guarding or rebound. Negative signs include Murphy's sign.     Hernia: No hernia is present.  Musculoskeletal:     Right lower leg: No edema.     Left lower leg: No edema.  Comments:  L plantar 2nd toe with small firm mobile mass tender to palpation   Skin:    General: Skin is warm and dry.     Findings: No rash.  Neurological:     Mental Status: She is alert.  Psychiatric:        Mood and Affect: Mood normal.        Behavior: Behavior normal.       Results for orders placed or performed in visit on 05/30/22  Comprehensive metabolic panel  Result Value Ref Range   Sodium 136 135 - 145 mEq/L   Potassium 3.9 3.5 - 5.1 mEq/L   Chloride 93 (L) 96 - 112 mEq/L   CO2 36 (H) 19 - 32 mEq/L   Glucose, Bld 109 (H) 70 - 99 mg/dL   BUN 9 6 - 23 mg/dL   Creatinine, Ser 1.61 0.40 - 1.20 mg/dL   Total Bilirubin 0.5 0.2 - 1.2 mg/dL   Alkaline Phosphatase 74 39 - 117 U/L   AST 21 0 - 37 U/L   ALT 54 (H) 0 - 35 U/L   Total Protein 7.3 6.0 - 8.3 g/dL   Albumin 4.3 3.5 - 5.2 g/dL   GFR 09.60 >45.40 mL/min   Calcium 9.4 8.4 - 10.5 mg/dL  CBC with Differential/Platelet  Result Value Ref Range   WBC 6.7 4.0 - 10.5 K/uL   RBC 4.90 3.87 - 5.11 Mil/uL   Hemoglobin 14.2 12.0 - 15.0 g/dL   HCT 98.1 19.1 - 47.8 %   MCV 86.3 78.0 - 100.0 fl   MCHC 33.7 30.0 - 36.0 g/dL   RDW 29.5 62.1 - 30.8 %   Platelets 365.0 150.0 - 400.0 K/uL   Neutrophils Relative % 67.9 43.0 - 77.0 %   Lymphocytes Relative 25.1 12.0 - 46.0 %   Monocytes Relative 6.4 3.0 - 12.0 %   Eosinophils Relative 0.0 0.0 - 5.0 %   Basophils Relative 0.6 0.0 - 3.0 %   Neutro Abs 4.5 1.4 - 7.7 K/uL   Lymphs Abs 1.7 0.7 - 4.0 K/uL   Monocytes Absolute 0.4 0.1 - 1.0 K/uL   Eosinophils Absolute 0.0 0.0 - 0.7 K/uL   Basophils Absolute 0.0 0.0 - 0.1 K/uL  Lipase  Result Value Ref Range    Lipase 16.0 11.0 - 59.0 U/L  POCT Urinalysis Dipstick (Automated)  Result Value Ref Range   Color, UA yellow    Clarity, UA clear    Glucose, UA Negative Negative   Bilirubin, UA negative    Ketones, UA negative    Spec Grav, UA 1.015 1.010 - 1.025   Blood, UA negative    pH, UA 5.0 5.0 - 8.0   Protein, UA Negative Negative   Urobilinogen, UA 0.2 0.2 or 1.0 E.U./dL   Nitrite, UA negative    Leukocytes, UA Negative Negative    Assessment & Plan:   Problem List Items Addressed This Visit     Irritable bowel syndrome    H/o this - see above.      Abdominal discomfort - Primary    Acute viral gastroenteritis almost 2 wks ago (family also affected) with residual RUQ/epigastric discomfort associated with increased gassiness, indigestion, post-prandial nausea witout vomiting. Anticipate post-infectious IBS /exacerbation of known IBS. She's managing with carafate and mylanta with limited benefit.  She had previously been able to discontinue omeprazole - will recommend start daily probiotic activia x 2 wks, as well as pepcid 20mg  BID and if no better then  start omeprazole 2-3 wk course to treat any possible residual GI irritation from recent viral GI illness.  Will also update labwork today (CBC, CMP, lipase).  Update if not better.  She wonders if daily Qlipta for migraine prevention may be contributing - she did have poor response to Nurtec previously due to similar GI upset. If above not helpful, may trial off Qlipta.   She is s/p cholecystectomy. Previous liver imaging unrevealing except for stable hemangiomas (2023)      Relevant Orders   POCT Urinalysis Dipstick (Automated) (Completed)   Comprehensive metabolic panel (Completed)   CBC with Differential/Platelet (Completed)   Lipase (Completed)   Lower abdominal pain    Describes months of L groin, suprapubic discomfort described as discomfort intermittently with sharp pains.  Check UA today.       Relevant Orders   POCT  Urinalysis Dipstick (Automated) (Completed)   Comprehensive metabolic panel (Completed)   CBC with Differential/Platelet (Completed)   Digital mucinous cyst of toe of left foot    Cyst to plantar left 2nd toe - anticipate mucinous cyst vs scarred nodule. Reassurance provided. May try warm compresses as needed. If painful and bothersome, could refer for podiatry evaluation but would desire to avoid surgery for now.  Discussed callus pad vs bandaid use for extra cushioning and wearing shoes with wide toe box.         No orders of the defined types were placed in this encounter.   Orders Placed This Encounter  Procedures   Comprehensive metabolic panel   CBC with Differential/Platelet   Lipase   POCT Urinalysis Dipstick (Automated)    Patient Instructions  Labs today Can try callus pad or bandaid to tender nodule under left great toe I think GI symptoms are coming from residual irritation after recent viral gastroenteritis. May try activia probiotic yogurt for a few days with pepcid 20mg  twice daily and if no better then take omeprazole 40mg  daily for 2  weeks.  If no better, may also trial off Qlipta for 1-2 weeks to see if GI symptoms improve.   Follow up plan: Return if symptoms worsen or fail to improve.  Eustaquio Boyden, MD

## 2022-05-31 LAB — COMPREHENSIVE METABOLIC PANEL
ALT: 54 U/L — ABNORMAL HIGH (ref 0–35)
AST: 21 U/L (ref 0–37)
Albumin: 4.3 g/dL (ref 3.5–5.2)
Alkaline Phosphatase: 74 U/L (ref 39–117)
BUN: 9 mg/dL (ref 6–23)
CO2: 36 mEq/L — ABNORMAL HIGH (ref 19–32)
Calcium: 9.4 mg/dL (ref 8.4–10.5)
Chloride: 93 mEq/L — ABNORMAL LOW (ref 96–112)
Creatinine, Ser: 0.96 mg/dL (ref 0.40–1.20)
GFR: 66.34 mL/min (ref >60.00)
Glucose, Bld: 109 mg/dL — ABNORMAL HIGH (ref 70–99)
Potassium: 3.9 mEq/L (ref 3.5–5.1)
Sodium: 136 mEq/L (ref 135–145)
Total Bilirubin: 0.5 mg/dL (ref 0.2–1.2)
Total Protein: 7.3 g/dL (ref 6.0–8.3)

## 2022-05-31 LAB — CBC WITH DIFFERENTIAL/PLATELET
Basophils Absolute: 0 10*3/uL (ref 0.0–0.1)
Basophils Relative: 0.6 % (ref 0.0–3.0)
Eosinophils Absolute: 0 10*3/uL (ref 0.0–0.7)
Eosinophils Relative: 0 % (ref 0.0–5.0)
HCT: 42.3 % (ref 36.0–46.0)
Hemoglobin: 14.2 g/dL (ref 12.0–15.0)
Lymphocytes Relative: 25.1 % (ref 12.0–46.0)
Lymphs Abs: 1.7 10*3/uL (ref 0.7–4.0)
MCHC: 33.7 g/dL (ref 30.0–36.0)
MCV: 86.3 fl (ref 78.0–100.0)
Monocytes Absolute: 0.4 10*3/uL (ref 0.1–1.0)
Monocytes Relative: 6.4 % (ref 3.0–12.0)
Neutro Abs: 4.5 10*3/uL (ref 1.4–7.7)
Neutrophils Relative %: 67.9 % (ref 43.0–77.0)
Platelets: 365 10*3/uL (ref 150.0–400.0)
RBC: 4.9 Mil/uL (ref 3.87–5.11)
RDW: 13.1 % (ref 11.5–15.5)
WBC: 6.7 10*3/uL (ref 4.0–10.5)

## 2022-05-31 LAB — LIPASE: Lipase: 16 U/L (ref 11.0–59.0)

## 2022-06-01 DIAGNOSIS — M67472 Ganglion, left ankle and foot: Secondary | ICD-10-CM | POA: Insufficient documentation

## 2022-06-01 NOTE — Assessment & Plan Note (Signed)
Describes months of L groin, suprapubic discomfort described as discomfort intermittently with sharp pains.  Check UA today.

## 2022-06-01 NOTE — Assessment & Plan Note (Addendum)
Acute viral gastroenteritis almost 2 wks ago (family also affected) with residual RUQ/epigastric discomfort associated with increased gassiness, indigestion, post-prandial nausea witout vomiting. Anticipate post-infectious IBS /exacerbation of known IBS. She's managing with carafate and mylanta with limited benefit.  She had previously been able to discontinue omeprazole - will recommend start daily probiotic activia x 2 wks, as well as pepcid 20mg  BID and if no better then start omeprazole 2-3 wk course to treat any possible residual GI irritation from recent viral GI illness.  Will also update labwork today (CBC, CMP, lipase).  Update if not better.  She wonders if daily Qlipta for migraine prevention may be contributing - she did have poor response to Nurtec previously due to similar GI upset. If above not helpful, may trial off Qlipta.   She is s/p cholecystectomy. Previous liver imaging unrevealing except for stable hemangiomas (2023)

## 2022-06-01 NOTE — Assessment & Plan Note (Signed)
H/o this - see above.

## 2022-06-01 NOTE — Assessment & Plan Note (Addendum)
Cyst to plantar left 2nd toe - anticipate mucinous cyst vs scarred nodule. Reassurance provided. May try warm compresses as needed. If painful and bothersome, could refer for podiatry evaluation but would desire to avoid surgery for now.  Discussed callus pad vs bandaid use for extra cushioning and wearing shoes with wide toe box.

## 2022-06-08 ENCOUNTER — Encounter: Payer: Self-pay | Admitting: Family Medicine

## 2022-06-08 DIAGNOSIS — E038 Other specified hypothyroidism: Secondary | ICD-10-CM

## 2022-06-08 DIAGNOSIS — E2839 Other primary ovarian failure: Secondary | ICD-10-CM

## 2022-06-08 DIAGNOSIS — R1030 Lower abdominal pain, unspecified: Secondary | ICD-10-CM

## 2022-06-13 ENCOUNTER — Ambulatory Visit (INDEPENDENT_AMBULATORY_CARE_PROVIDER_SITE_OTHER)
Admission: RE | Admit: 2022-06-13 | Discharge: 2022-06-13 | Disposition: A | Discharge status: Home / Self Care | Payer: Medicare Other | Source: Ambulatory Visit | Attending: Family Medicine | Admitting: Family Medicine

## 2022-06-13 ENCOUNTER — Other Ambulatory Visit (INDEPENDENT_AMBULATORY_CARE_PROVIDER_SITE_OTHER): Payer: Medicare Other

## 2022-06-13 DIAGNOSIS — R1030 Lower abdominal pain, unspecified: Secondary | ICD-10-CM

## 2022-06-14 LAB — URINALYSIS, ROUTINE W REFLEX MICROSCOPIC
Bilirubin Urine: NEGATIVE
Hgb urine dipstick: NEGATIVE
Ketones, ur: NEGATIVE
Leukocytes,Ua: NEGATIVE
Nitrite: NEGATIVE
RBC / HPF: NONE SEEN (ref 0–?)
Specific Gravity, Urine: <=1.005 — AB (ref 1.000–1.030)
Total Protein, Urine: NEGATIVE
Urine Glucose: NEGATIVE
Urobilinogen, UA: 0.2 (ref 0.0–1.0)
pH: 5.5 (ref 5.0–8.0)

## 2022-06-18 ENCOUNTER — Encounter: Payer: Self-pay | Admitting: Family Medicine

## 2022-06-18 ENCOUNTER — Ambulatory Visit: Payer: Medicare Other | Admitting: Family Medicine

## 2022-06-18 VITALS — BP 106/64 | HR 82 | Temp 97.7°F | Ht 67.5 in | Wt 176.0 lb

## 2022-06-18 DIAGNOSIS — R1030 Lower abdominal pain, unspecified: Secondary | ICD-10-CM

## 2022-06-18 LAB — COMPREHENSIVE METABOLIC PANEL
ALT: 26 U/L (ref 0–35)
AST: 20 U/L (ref 0–37)
Albumin: 4.2 g/dL (ref 3.5–5.2)
Alkaline Phosphatase: 68 U/L (ref 39–117)
BUN: 12 mg/dL (ref 6–23)
CO2: 31 mEq/L (ref 19–32)
Calcium: 9.6 mg/dL (ref 8.4–10.5)
Chloride: 101 mEq/L (ref 96–112)
Creatinine, Ser: 0.89 mg/dL (ref 0.40–1.20)
GFR: 72.62 mL/min (ref >60.00)
Glucose, Bld: 99 mg/dL (ref 70–99)
Potassium: 3.5 mEq/L (ref 3.5–5.1)
Sodium: 139 mEq/L (ref 135–145)
Total Bilirubin: 0.6 mg/dL (ref 0.2–1.2)
Total Protein: 7.2 g/dL (ref 6.0–8.3)

## 2022-06-18 NOTE — Patient Instructions (Signed)
Go to the lab on the way out.   If you have mychart we'll likely use that to update you.    Let me check with Dr. Reece Agar in the meantime.  Gradually try to increase bland solids in the meantime.  Drink enough gatorade/water to keep your urine clear or light colored.

## 2022-06-18 NOTE — Progress Notes (Unsigned)
Family was sick with GI sx.  Had eval here 05/30/22. Prev labs d/w pt.  She is off qlipta in the meantime.    Prev with RLQ pain, had nausea.  RLQ pain is some better but not resolved.  "I don't feel right."  Still with nausea, feels off balance upon standing.  She feels weak in general.  She gets lightheaded on standing but that improves with persistent standing.  She usually uses a cane.  Dec in PO solids. Still taking fluids.  Taking gatorade.    No blood in stools.  No black stools. No vomiting since 05/20/22.  Taking zofran prn for nausea.    She has h/o muscle spasms in legs at baseline but now she feels like she has a spasm in her neck.  She has voice changes with that.  She can breathe through it.  She is not on omeprazole- she had gagging and reflux when taking the medicine.  She had acidic foamy material bubbling back up when taking prilosec.    She had h/o gastroparesis with prev opiate use.  She is minimally better than last OV.    Meds, vitals, and allergies reviewed.   ROS: Per HPI unless specifically indicated in ROS section   GEN: nad, alert and oriented HEENT: ncat NECK: supple w/o LA, no stridor.   CV: rrr PULM: ctab, no inc wob ABD: soft, +bs, not ttp.  EXT: no edema SKIN: well perfused.

## 2022-06-21 NOTE — Assessment & Plan Note (Signed)
Still okay for outpatient follow-up.  Symptoms could have been related to [place] use.  She is off that medication in the meantime.  Recheck labs pending. It sounds like she had laryngospasm that was reflux related.  Unclear if this was also related to [place].  I would not suspect omeprazole was the causative issue with that. I will update Dr. Reece Agar in the meantime.  I asked her to gradually try to increase bland solids in the meantime.  Drink enough gatorade/water to keep urine clear or light colored.

## 2022-06-23 ENCOUNTER — Other Ambulatory Visit: Payer: Self-pay | Admitting: Family Medicine

## 2022-06-23 DIAGNOSIS — F411 Generalized anxiety disorder: Secondary | ICD-10-CM

## 2022-06-25 NOTE — Telephone Encounter (Signed)
Name of Medication:  Lorazepam Name of Pharmacy:  [facility] Last Fill or Written Date and Quantity:  04/27/22, #60 Last Office Visit and Type:  05/30/22, abd discomfort Next Office Visit and Type:  10/01/22, CPE Last Controlled Substance Agreement Date:  06/10/13 Last UDS: scanned

## 2022-06-26 ENCOUNTER — Encounter: Payer: Self-pay | Admitting: Anesthesiology

## 2022-06-26 ENCOUNTER — Other Ambulatory Visit: Payer: Self-pay | Admitting: Anesthesiology

## 2022-06-26 DIAGNOSIS — M549 Dorsalgia, unspecified: Secondary | ICD-10-CM

## 2022-06-26 MED ORDER — LORAZEPAM 1 MG PO TABS
0.5000 mg | ORAL_TABLET | Freq: Two times a day (BID) | ORAL | 0 refills | Status: DC | PRN
Start: 2022-06-26 — End: 2022-08-22

## 2022-06-26 NOTE — Telephone Encounter (Signed)
ERx 

## 2022-06-29 ENCOUNTER — Ambulatory Visit
Admission: RE | Admit: 2022-06-29 | Discharge: 2022-06-29 | Disposition: A | Discharge status: Home / Self Care | Payer: Medicare Other | Source: Ambulatory Visit | Attending: Anesthesiology | Admitting: Anesthesiology

## 2022-06-29 DIAGNOSIS — M549 Dorsalgia, unspecified: Secondary | ICD-10-CM

## 2022-07-02 NOTE — Addendum Note (Signed)
Addended by: Eustaquio Boyden on: 07/02/2022 05:52 PM   Modules accepted: Orders

## 2022-07-04 ENCOUNTER — Ambulatory Visit
Admission: RE | Admit: 2022-07-04 | Discharge: 2022-07-04 | Disposition: A | Discharge status: Home / Self Care | Payer: Medicare Other | Source: Ambulatory Visit | Attending: Family Medicine | Admitting: Family Medicine

## 2022-07-04 DIAGNOSIS — E063 Autoimmune thyroiditis: Secondary | ICD-10-CM | POA: Insufficient documentation

## 2022-07-04 DIAGNOSIS — E2839 Other primary ovarian failure: Secondary | ICD-10-CM | POA: Diagnosis present

## 2022-07-04 DIAGNOSIS — E038 Other specified hypothyroidism: Secondary | ICD-10-CM | POA: Diagnosis present

## 2022-07-06 ENCOUNTER — Encounter: Payer: Self-pay | Admitting: Family Medicine

## 2022-07-06 DIAGNOSIS — M8580 Other specified disorders of bone density and structure, unspecified site: Secondary | ICD-10-CM | POA: Insufficient documentation

## 2022-07-07 LAB — FECAL OCCULT BLOOD, GUAIAC: Fecal Occult Blood: NEGATIVE

## 2022-07-09 ENCOUNTER — Ambulatory Visit: Payer: Medicare Other | Admitting: Family Medicine

## 2022-07-09 ENCOUNTER — Encounter: Payer: Self-pay | Admitting: Family Medicine

## 2022-07-09 VITALS — BP 120/80 | HR 85 | Temp 97.7°F | Ht 67.5 in | Wt 175.4 lb

## 2022-07-09 DIAGNOSIS — G500 Trigeminal neuralgia: Secondary | ICD-10-CM

## 2022-07-09 DIAGNOSIS — H539 Unspecified visual disturbance: Secondary | ICD-10-CM | POA: Diagnosis not present

## 2022-07-09 DIAGNOSIS — M2550 Pain in unspecified joint: Secondary | ICD-10-CM

## 2022-07-09 DIAGNOSIS — M85832 Other specified disorders of bone density and structure, left forearm: Secondary | ICD-10-CM

## 2022-07-09 DIAGNOSIS — H168 Other keratitis: Secondary | ICD-10-CM

## 2022-07-09 DIAGNOSIS — G43109 Migraine with aura, not intractable, without status migrainosus: Secondary | ICD-10-CM

## 2022-07-09 DIAGNOSIS — R519 Headache, unspecified: Secondary | ICD-10-CM | POA: Diagnosis not present

## 2022-07-09 DIAGNOSIS — R200 Anesthesia of skin: Secondary | ICD-10-CM | POA: Diagnosis not present

## 2022-07-09 DIAGNOSIS — H04123 Dry eye syndrome of bilateral lacrimal glands: Secondary | ICD-10-CM

## 2022-07-09 MED ORDER — KETOROLAC TROMETHAMINE 30 MG/ML IJ SOLN
30.0000 mg | Freq: Once | INTRAMUSCULAR | Status: AC
Start: 2022-07-09 — End: 2022-07-09
  Administered 2022-07-09: 30 mg via INTRAMUSCULAR

## 2022-07-09 NOTE — Patient Instructions (Addendum)
Labs today including inflammatory markers for headache and joint pains.  Vision screen today  I want you to call and schedule appointment with ophthalmologist for eye exam as soon as possible given vision changes noted today. Referral placed to Dr Brooke Dare at [facility].  Toradol shot today.  Sleep questionnaire today.

## 2022-07-09 NOTE — Progress Notes (Unsigned)
Ph: [phone] Fax: [phone]   Patient ID: Karen Aguilar, female    DOB: 26-Dec-1965, 57 y.o.   MRN: [id number]  This visit was conducted in person.  BP 120/80   Pulse 85   Temp 97.7 F (36.5 C) (Temporal)   Ht 5' 7.5" (1.715 m)   Wt 175 lb 6 oz (79.5 kg)   LMP 10/04/2014 Comment: per labs  SpO2 97%   BMI 27.06 kg/m   Vision Screening   Right eye Left eye Both eyes  Without correction 20/30 20/70 20/40   With correction      CC: joint pains, migraine  Subjective:   HPI: CAILANI Aguilar is a 57 y.o. female presenting on 07/09/2022 for Joint Pain (C/o severe joint pain. States joint pain has improved today. ) and Migraine (C/o severe migraine. Started this AM and affecting vision in L eye. Says it looks like objects were overlapping each other. )   1+ wk h/o bilateral hand pain at MCPs and IPs as well as bilateral lower back pain, trouble going up steps. No redness or warmth or swelling of joints.  She avoids oral NSAIDs due to GI issues. Oral tylenol wasn't helpful.  Over the weekend symptoms did start improving.   This morning woke up with facial pain around eyes that has progressed to left side of face associated with new L eye vision changes (vertically overlapping objects). At its worse 10/10 pain, photo/phonophobia, nauseated with it. She lay down with some benefit and currently pain 6/10 pain. Vision changes are also resolving.  She drove here.   Chronic numbness to lips, peeling to lips, left facial numbness - known damage to L trigeminal nerve.   She has been seeing ophthalmology specialists - currently seeing specialist and getting fitted for scleral lenses.  Has cornea specialist - uses eye drops made out of autologous plasma.  Currently seeing [facility] specialists.  Previously saw Dr Brooke Dare.   DEXA 07/2022 - L forearm -2.0, RTF -1.4, not at increased risk of fracture   Last brain and cervical spine imaging 10/2019 for severe L sided headache: MR brian  IMPRESSION: Normal examination. No abnormality seen to explain the presenting symptoms. MR cervical spine without contrast IMPRESSION: 1. Interval ACDF procedures from C4 through C7. Solid union with wide patency of the canal and foramina from C4 through C6. Mild bilateral foraminal encroachment by bone at C6-7. This would have some potential to affect the C7 nerves.  2. C7-T1: Bilateral uncovertebral prominence with bilateral foraminal narrowing, right worse than left. Some potential to affect either or both C8 nerves, particularly the right. 3. C3-4: Mild disc bulge. Mild right foraminal narrowing due to facet hypertrophy.  H/o negative temporal artery biopsy 09/2019.   Had entropion surgery several months ago - was asked about sleep apnea.  Non restorative sleep. Does not feel sleepy during the day. Not loud snorer. No witnessed apnea.   She was on Qulipta for about a month, but stopped 1 month ago due to worsening GI symptoms - which have since resolved since off med. She is currently on baclofen 5mg  TID scheduled for neck pain which was precipitating headaches as well.       Relevant past medical, surgical, family and social history reviewed and updated as indicated. Interim medical history since our last visit reviewed. Allergies and medications reviewed and updated. Outpatient Medications Prior to Visit  Medication Sig Dispense Refill   Ascorbic Acid (VITAMIN C PO) Take 1 tablet by mouth as  needed (for immune system support).      baclofen (LIORESAL) 10 MG tablet Take 0.5 tablets (5 mg total) by mouth 3 (three) times daily as needed.     calcium carbonate (TUMS EX) 750 MG chewable tablet Chew 2-3 tablets by mouth 2 (two) times daily as needed.     camphor-menthol (SARNA) lotion Apply 1 application topically at bedtime as needed for itching.      Carboxymethylcellulose Sodium (EYE DROPS OP) Apply to eye. PRGF eye drops. 1 drop 4-6 times a day in left eye     Cholecalciferol (VITAMIN D)  2000 units CAPS Take 1 capsule (2,000 Units total) by mouth daily. 30 capsule    diclofenac sodium (VOLTAREN) 1 % GEL APPLY THIN LAYER EXTERNALLY TO THE AFFECTED AREA UP TO FOUR TIMES DAILY AS NEEDED 300 g 0   docusate sodium (COLACE) 100 MG capsule Take 100 mg by mouth 2 (two) times daily. As needed     LORazepam (ATIVAN) 1 MG tablet Take 0.5-1 tablets (0.5-1 mg total) by mouth 2 (two) times daily as needed for anxiety. 60 tablet 0   NONFORMULARY OR COMPOUNDED ITEM Hydroeye supplement - with fish oil, vitamin C, A, E, B6     NUCYNTA ER 250 MG TB12 SMARTSIG:1 Tablet(s) By Mouth Every 12 Hours     ondansetron (ZOFRAN-ODT) 4 MG disintegrating tablet Take 1 tablet (4 mg total) by mouth every 8 (eight) hours as needed for nausea or vomiting. 20 tablet 0   Potassium 99 MG TABS Take 1 tablet (99 mg total) by mouth daily.  0   thyroid (ARMOUR) 90 MG tablet Take 1 tablet (90 mg total) by mouth daily. One day a week take 1/2 tablet     TYRVAYA 0.03 MG/ACT SOLN Place 1 spray into both nostrils 2 (two) times daily.     No facility-administered medications prior to visit.     Per HPI unless specifically indicated in ROS section below Review of Systems  Objective:  BP 120/80   Pulse 85   Temp 97.7 F (36.5 C) (Temporal)   Ht 5' 7.5" (1.715 m)   Wt 175 lb 6 oz (79.5 kg)   LMP 10/04/2014 Comment: per labs  SpO2 97%   BMI 27.06 kg/m   Wt Readings from Last 3 Encounters:  07/09/22 175 lb 6 oz (79.5 kg)  06/18/22 176 lb (79.8 kg)  05/30/22 177 lb 6 oz (80.5 kg)      Physical Exam Vitals and nursing note reviewed.  Constitutional:      Appearance: Normal appearance. She is not ill-appearing.  HENT:     Head: Normocephalic and atraumatic.     Mouth/Throat:     Mouth: Mucous membranes are moist.     Pharynx: Oropharynx is clear. No oropharyngeal exudate or posterior oropharyngeal erythema.  Eyes:     General: No scleral icterus.       Right eye: No discharge.        Left eye: No discharge.      Extraocular Movements: Extraocular movements intact.     Conjunctiva/sclera: Conjunctivae normal.     Pupils: Pupils are equal, round, and reactive to light.  Neck:     Thyroid: No thyroid mass or thyromegaly.  Cardiovascular:     Rate and Rhythm: Normal rate and regular rhythm.     Pulses: Normal pulses.     Heart sounds: Normal heart sounds. No murmur heard. Pulmonary:     Effort: Pulmonary effort is normal. No respiratory distress.  Breath sounds: Normal breath sounds. No wheezing, rhonchi or rales.  Musculoskeletal:        General: No tenderness. Normal range of motion.     Cervical back: Normal range of motion and neck supple.     Right lower leg: No edema.     Left lower leg: No edema.     Comments: No active synovitis to hands  Lymphadenopathy:     Cervical: No cervical adenopathy.  Skin:    General: Skin is warm and dry.     Findings: No rash.  Neurological:     General: No focal deficit present.     Mental Status: She is alert and oriented to person, place, and time.     Cranial Nerves: Cranial nerve deficit present. No dysarthria or facial asymmetry.     Sensory: Sensation is intact.     Motor: Motor function is intact.     Coordination: Coordination is intact. Romberg sign negative.     Gait: Gait is intact.     Comments:  CN 2-12 intact except for chronic left facial numbness in h/o trigeminal nerve damage FTN intact EOMI No pronator drift  Psychiatric:        Mood and Affect: Mood normal.        Behavior: Behavior normal.        Thought Content: Thought content normal.       Results for orders placed or performed in visit on 07/09/22  Sedimentation rate  Result Value Ref Range   Sed Rate 15 0 - 30 mm/hr  Rheumatoid factor  Result Value Ref Range   Rheumatoid fact SerPl-aCnc <10 <14 IU/mL  C-reactive protein  Result Value Ref Range   CRP <1.0 0.5 - 20.0 mg/dL    Assessment & Plan:   Problem List Items Addressed This Visit     Complicated  migraine    H/o this, anticipate recurrence.      Left facial numbness    Has chronic left facial numbness thought related to trigeminal neuralgia s/p normal brain MRI 2018 and again 2021.       Chronically dry eyes, bilateral    Sees cornea specialist at [facility], using eye drops as per below.       Trigeminal neuralgia    Has chronic facial numbness s/p normal brain MRI 2018 and again 2021. Intolerant to nurtec, qulipta.       Neurotrophic keratitis of left eye    Neurotrophic keratitis diagnosed 2022. Thought stemming from trigeminal neuralgia causing nerve damage. She uses autologous plasma eye drops, currently getting fitted for corneal contacts.       Osteopenia    Discussed this with patient, recommend 1200mg  calcium dialy and continue vit D 2000 international units daily.       Polyarthralgia - Primary    Unclear cause, symptoms now largely improved.  Check for autoimmune cause of hand and back pain with ANA, RF, inflammatory markers.       Relevant Orders   Sedimentation rate (Completed)   ANA   Rheumatoid factor (Completed)   C-reactive protein (Completed)   Left-sided headache    Of unclear cause.  Discussed normally evaluation for unilateral headache with diplopia would include head imaging to r/o stroke and r/o temporal arteritis.  However she has already had evaluation for both (brain MR 2021, left temporal artery biopsy 2019).  Will check ESR/CRP.  Suspect complex migraine without aura. With associated vision changes, did recommend ophthalmology evaluation. To consider neurology evaluation  as well.  Will treat headache today with toradol 30mg  IM. ER precautions reviewed. Pt agrees with plan       Relevant Orders   Ambulatory referral to Ophthalmology   Vision changes    Recent headache with associated L eye blurry vision and what sounds like vertical diplopia.  Snellen today with decreased visual acuity on left. Did recommend urgent ophthalmology evaluation  - will refer to [facility]. She has previously seen Dr Brooke Dare.       Relevant Orders   Ambulatory referral to Ophthalmology     Meds ordered this encounter  Medications   ketorolac (TORADOL) 30 MG/ML injection 30 mg    Orders Placed This Encounter  Procedures   Sedimentation rate   ANA   Rheumatoid factor   C-reactive protein   Ambulatory referral to Ophthalmology    Referral Priority:   Urgent    Referral Type:   Consultation    Referral Reason:   Specialty Services Required    Requested Specialty:   Ophthalmology    Number of Visits Requested:   1    Patient Instructions  Labs today including inflammatory markers for headache and joint pains.  Vision screen today  I want you to call and schedule appointment with ophthalmologist for eye exam as soon as possible given vision changes noted today. Referral placed to Dr Brooke Dare at [facility].  Toradol shot today.  Sleep questionnaire today.   Follow up plan: Return if symptoms worsen or fail to improve.  Eustaquio Boyden, MD

## 2022-07-10 LAB — SEDIMENTATION RATE: Sed Rate: 15 mm/hr (ref 0–30)

## 2022-07-10 LAB — RHEUMATOID FACTOR: Rheumatoid fact SerPl-aCnc: <10 IU/mL (ref <14)

## 2022-07-10 LAB — C-REACTIVE PROTEIN: CRP: <1 mg/dL (ref 0.5–20.0)

## 2022-07-11 LAB — ANTI-NUCLEAR AB-TITER (ANA TITER): ANA Titer 1: 1:160 {titer} — ABNORMAL HIGH

## 2022-07-11 LAB — ANA: Anti Nuclear Antibody (ANA): POSITIVE — AB

## 2022-07-11 NOTE — Assessment & Plan Note (Addendum)
H/o this, anticipate recurrence.

## 2022-07-11 NOTE — Assessment & Plan Note (Signed)
Has chronic left facial numbness thought related to trigeminal neuralgia s/p normal brain MRI 2018 and again 2021.

## 2022-07-11 NOTE — Assessment & Plan Note (Signed)
Has chronic facial numbness s/p normal brain MRI 2018 and again 2021. Intolerant to nurtec, qulipta.

## 2022-07-11 NOTE — Assessment & Plan Note (Signed)
Of unclear cause.  Discussed normally evaluation for unilateral headache with diplopia would include head imaging to r/o stroke and r/o temporal arteritis.  However she has already had evaluation for both (brain MR 2021, left temporal artery biopsy 2019).  Will check ESR/CRP.  Suspect complex migraine without aura. With associated vision changes, did recommend ophthalmology evaluation. To consider neurology evaluation as well.  Will treat headache today with toradol 30mg  IM. ER precautions reviewed. Pt agrees with plan

## 2022-07-11 NOTE — Assessment & Plan Note (Addendum)
Sees cornea specialist at [facility], using eye drops as per below.

## 2022-07-11 NOTE — Assessment & Plan Note (Signed)
Neurotrophic keratitis diagnosed 2022. Thought stemming from trigeminal neuralgia causing nerve damage. She uses autologous plasma eye drops, currently getting fitted for corneal contacts.

## 2022-07-11 NOTE — Assessment & Plan Note (Signed)
Recent headache with associated L eye blurry vision and what sounds like vertical diplopia.  Snellen today with decreased visual acuity on left. Did recommend urgent ophthalmology evaluation - will refer to [facility]. She has previously seen Dr Brooke Dare.

## 2022-07-11 NOTE — Assessment & Plan Note (Signed)
Discussed this with patient, recommend 1200mg  calcium dialy and continue vit D 2000 international units daily.

## 2022-07-11 NOTE — Assessment & Plan Note (Signed)
Unclear cause, symptoms now largely improved.  Check for autoimmune cause of hand and back pain with ANA, RF, inflammatory markers.

## 2022-07-12 ENCOUNTER — Encounter: Payer: Self-pay | Admitting: *Deleted

## 2022-07-13 ENCOUNTER — Encounter: Payer: Self-pay | Admitting: Family Medicine

## 2022-07-13 DIAGNOSIS — R519 Headache, unspecified: Secondary | ICD-10-CM

## 2022-07-13 DIAGNOSIS — M2550 Pain in unspecified joint: Secondary | ICD-10-CM

## 2022-07-13 DIAGNOSIS — H168 Other keratitis: Secondary | ICD-10-CM

## 2022-07-13 DIAGNOSIS — R768 Other specified abnormal immunological findings in serum: Secondary | ICD-10-CM

## 2022-07-13 DIAGNOSIS — H539 Unspecified visual disturbance: Secondary | ICD-10-CM

## 2022-07-13 DIAGNOSIS — R200 Anesthesia of skin: Secondary | ICD-10-CM

## 2022-07-16 ENCOUNTER — Encounter: Payer: Self-pay | Admitting: *Deleted

## 2022-07-16 ENCOUNTER — Encounter: Payer: Self-pay | Admitting: Family Medicine

## 2022-07-20 ENCOUNTER — Other Ambulatory Visit (INDEPENDENT_AMBULATORY_CARE_PROVIDER_SITE_OTHER): Payer: Medicare Other

## 2022-07-20 DIAGNOSIS — R200 Anesthesia of skin: Secondary | ICD-10-CM

## 2022-07-20 DIAGNOSIS — R768 Other specified abnormal immunological findings in serum: Secondary | ICD-10-CM | POA: Diagnosis not present

## 2022-07-20 DIAGNOSIS — H539 Unspecified visual disturbance: Secondary | ICD-10-CM

## 2022-07-20 DIAGNOSIS — H168 Other keratitis: Secondary | ICD-10-CM

## 2022-07-20 NOTE — Addendum Note (Signed)
Addended by: Alvina Chou on: 07/20/2022 02:28 PM   Modules accepted: Orders

## 2022-07-21 LAB — ANTI-DNA ANTIBODY, DOUBLE-STRANDED: ds DNA Ab: <1 IU/mL

## 2022-07-21 LAB — SJOGREN'S SYNDROME ANTIBODS(SSA + SSB): SSB (La) (ENA) Antibody, IgG: <1 AI

## 2022-07-21 LAB — CK: Total CK: 41 U/L (ref 29–143)

## 2022-07-24 LAB — SJOGREN'S SYNDROME ANTIBODS(SSA + SSB): SSA (Ro) (ENA) Antibody, IgG: <1 AI

## 2022-07-24 LAB — C3 AND C4
C3 Complement: 133 mg/dL (ref 83–193)
C4 Complement: 26 mg/dL (ref 15–57)

## 2022-08-07 ENCOUNTER — Encounter: Payer: Self-pay | Admitting: Family Medicine

## 2022-08-07 ENCOUNTER — Ambulatory Visit (INDEPENDENT_AMBULATORY_CARE_PROVIDER_SITE_OTHER): Payer: Medicare Other

## 2022-08-07 VITALS — BP 120/80 | Ht 67.0 in | Wt 175.0 lb

## 2022-08-07 DIAGNOSIS — H04123 Dry eye syndrome of bilateral lacrimal glands: Secondary | ICD-10-CM

## 2022-08-07 DIAGNOSIS — R768 Other specified abnormal immunological findings in serum: Secondary | ICD-10-CM

## 2022-08-07 DIAGNOSIS — M2550 Pain in unspecified joint: Secondary | ICD-10-CM

## 2022-08-07 DIAGNOSIS — Z0000 Encounter for general adult medical examination without abnormal findings: Secondary | ICD-10-CM | POA: Diagnosis not present

## 2022-08-07 DIAGNOSIS — R200 Anesthesia of skin: Secondary | ICD-10-CM

## 2022-08-07 DIAGNOSIS — H168 Other keratitis: Secondary | ICD-10-CM

## 2022-08-07 NOTE — Patient Instructions (Addendum)
Karen Aguilar , Thank you for taking time to come for your Medicare Wellness Visit. I appreciate your ongoing commitment to your health goals. Please review the following plan we discussed and let me know if I can assist you in the future.   These are the goals we discussed:  Goals      Increase physical activity     Starting 10/12/16, I will continue to do stretching 15 min twice daily.      Patient Stated     06/02/2021, wants to strengthen legs     Patient Stated     "Remain active"        This is a list of the screening recommended for you and due dates:  Health Maintenance  Topic Date Due   Zoster (Shingles) Vaccine (1 of 2) Never done   Pap Smear  08/17/2021   COVID-19 Vaccine (1 - 2023-24 season) Never done   DTaP/Tdap/Td vaccine (1 - Tdap) 10/13/2026*   Stool Blood Test  07/07/2023   Medicare Annual Wellness Visit  08/07/2023   Mammogram  11/15/2023   Colon Cancer Screening  03/09/2029   Hepatitis C Screening  Completed   HIV Screening  Completed   HPV Vaccine  Aged Out  *Topic was postponed. The date shown is not the original due date.    Advanced directives: Information on Advanced Care Planning can be found at [facility] Advance Health Care Directives Advance Health Care Directives ([email or web address]    Conditions/risks identified:  Understanding Your Risk for Falls Millions of people have serious injuries from falls each year. It is important to understand your risk of falling. Talk with your health care provider about your risk and what you can do to lower it. If you do have a serious fall, make sure to tell your provider. Falling once raises your risk of falling again. How can falls affect me? Serious injuries from falls are common. These include: Broken bones, such as hip fractures. Head injuries, such as traumatic brain injuries (TBI) or concussions. A fear of falling can cause you to avoid activities and stay at home. This can make your muscles  weaker and raise your risk for a fall. What can increase my risk? There are a number of risk factors that increase your risk for falling. The more risk factors you have, the higher your risk of falling. Serious injuries from a fall happen most often to people who are older than 56 years old. Teenagers and young adults ages 86-29 are also at higher risk. Common risk factors include: Weakness in the lower body. Being generally weak or confused due to long-term (chronic) illness. Dizziness or balance problems. Poor vision. Medicines that cause dizziness or drowsiness. These may include: Medicines for your blood pressure, heart, anxiety, insomnia, or swelling (edema). Pain medicines. Muscle relaxants. Other risk factors include: Drinking alcohol. Having had a fall in the past. Having foot pain or wearing improper footwear. Working at a dangerous job. Having any of the following in your home: Tripping hazards, such as floor clutter or loose rugs. Poor lighting. Pets. Having dementia or memory loss. What actions can I take to lower my risk of falling?     Physical activity Stay physically fit. Do strength and balance exercises. Consider taking a regular class to build strength and balance. Yoga and tai chi are good options. Vision Have your eyes checked every year and your prescription for glasses or contacts updated as needed. Shoes and walking aids Wear  non-skid shoes. Wear shoes that have rubber soles and low heels. Do not wear high heels. Do not walk around the house in socks or slippers. Use a cane or walker as told by your provider. Home safety Attach secure railings on both sides of your stairs. Install grab bars for your bathtub, shower, and toilet. Use a non-skid mat in your bathtub or shower. Attach bath mats securely with double-sided, non-slip rug tape. Use good lighting in all rooms. Keep a flashlight near your bed. Make sure there is a clear path from your bed to the  bathroom. Use night-lights. Do not use throw rugs. Make sure all carpeting is taped or tacked down securely. Remove all clutter from walkways and stairways, including extension cords. Repair uneven or broken steps and floors. Avoid walking on icy or slippery surfaces. Walk on the grass instead of on icy or slick sidewalks. Use ice melter to get rid of ice on walkways in the winter. Use a cordless phone. Questions to ask your health care provider Can you help me check my risk for a fall? Do any of my medicines make me more likely to fall? Should I take a vitamin D supplement? What exercises can I do to improve my strength and balance? Should I make an appointment to have my vision checked? Do I need a bone density test to check for weak bones (osteoporosis)? Would it help to use a cane or a walker? Where to find more information Centers for Disease Control and Prevention, [employer]: TonerPromos.no Community-Based Fall Prevention Programs: TonerPromos.no [employer] on Aging: BaseRingTones.pl Contact a health care provider if: You fall at home. You are afraid of falling at home. You feel weak, drowsy, or dizzy. This information is not intended to replace advice given to you by your health care provider. Make sure you discuss any questions you have with your health care provider. Document Revised: 08/21/2021 Document Reviewed: 08/21/2021 Elsevier Patient Education  2024 Elsevier Inc. Exercises to do While Sitting  Exercises that you do while sitting (chair exercises) can give you many of the same benefits as full exercise. Benefits include strengthening your heart, burning calories, and keeping muscles and joints healthy. Exercise can also improve your mood and help with depression and anxiety. You may benefit from chair exercises if you are unable to do standing exercises due to: Diabetic foot pain. Obesity. Illness. Arthritis. Recovery from surgery or injury. Breathing problems. Balance  problems. Another type of disability. Before starting chair exercises, check with your health care provider or a physical therapist to find out how much exercise you can tolerate and which exercises are safe for you. If your health care provider approves: Start out slowly and build up over time. Aim to work up to about 10-20 minutes for each exercise session. Make exercise part of your daily routine. Drink water when you exercise. Do not wait until you are thirsty. Drink every 10-15 minutes. Stop exercising right away if you have pain, nausea, shortness of breath, or dizziness. If you are exercising in a wheelchair, make sure to lock the wheels. Ask your health care provider whether you can do tai chi or yoga. Many positions in these mind-body exercises can be modified to do while seated. Warm-up Before starting other exercises: Sit up as straight as you can. Have your knees bent at 90 degrees, which is the shape of the capital letter "L." Keep your feet flat on the floor. Sit at the front edge of your chair, if you can.  Pull in (tighten) the muscles in your abdomen and stretch your spine and neck as straight as you can. Hold this position for a few minutes. Breathe in and out evenly. Try to concentrate on your breathing, and relax your mind. Stretching Exercise A: Arm stretch Hold your arms out straight in front of your body. Bend your hands at the wrist with your fingers pointing up, as if signaling someone to stop. Notice the slight tension in your forearms as you hold the position. Keeping your arms out and your hands bent, rotate your hands outward as far as you can and hold this stretch. Aim to have your thumbs pointing up and your pinkie fingers pointing down. Slowly repeat arm stretches for one minute as tolerated. Exercise B: Leg stretch If you can move your legs, try to "draw" letters on the floor with the toes of your foot. Write your name with one foot. Write your name with the  toes of your other foot. Slowly repeat the movements for one minute as tolerated. Exercise C: Reach for the sky Reach your hands as far over your head as you can to stretch your spine. Move your hands and arms as if you are climbing a rope. Slowly repeat the movements for one minute as tolerated. Range of motion exercises Exercise A: Shoulder roll Let your arms hang loosely at your sides. Lift just your shoulders up toward your ears, then let them relax back down. When your shoulders feel loose, rotate your shoulders in backward and forward circles. Do shoulder rolls slowly for one minute as tolerated. Exercise B: March in place As if you are marching, pump your arms and lift your legs up and down. Lift your knees as high as you can. If you are unable to lift your knees, just pump your arms and move your ankles and feet up and down. March in place for one minute as tolerated. Exercise C: Seated jumping jacks Let your arms hang down straight. Keeping your arms straight, lift them up over your head. Aim to point your fingers to the ceiling. While you lift your arms, straighten your legs and slide your heels along the floor to your sides, as wide as you can. As you bring your arms back down to your sides, slide your legs back together. If you are unable to use your legs, just move your arms. Slowly repeat seated jumping jacks for one minute as tolerated. Strengthening exercises Exercise A: Shoulder squeeze Hold your arms straight out from your body to your sides, with your elbows bent and your fists pointed at the ceiling. Keeping your arms in the bent position, move them forward so your elbows and forearms meet in front of your face. Open your arms back out as wide as you can with your elbows still bent, until you feel your shoulder blades squeezing together. Hold for 5 seconds. Slowly repeat the movements forward and backward for one minute as tolerated. Contact a health care provider  if: You have to stop exercising due to any of the following: Pain. Nausea. Shortness of breath. Dizziness. Fatigue. You have significant pain or soreness after exercising. Get help right away if: You have chest pain. You have difficulty breathing. These symptoms may represent a serious problem that is an emergency. Do not wait to see if the symptoms will go away. Get medical help right away. Call your local emergency services (911 in the [place] Do not drive yourself to the hospital. Summary Exercises that you do while  sitting (chair exercises) can strengthen your heart, burn calories, and keep muscles and joints healthy. You may benefit from chair exercises if you are unable to do standing exercises due to diabetic foot pain, obesity, recovery from surgery or injury, or other conditions. Before starting chair exercises, check with your health care provider or a physical therapist to find out how much exercise you can tolerate and which exercises are safe for you. This information is not intended to replace advice given to you by your health care provider. Make sure you discuss any questions you have with your health care provider. Document Revised: 02/14/2020 Document Reviewed: 02/14/2020 Elsevier Patient Education  2024 Elsevier Inc.   Next appointment: VIRTUAL/TELEPHONE APPOINTMENT Follow up in one year for your annual wellness visit.  August 12, 2023 at 2:00 pm telephone visit.   Preventive Care 40-64 Years, Female Preventive care refers to lifestyle choices and visits with your health care provider that can promote health and wellness. What does preventive care include? A yearly physical exam. This is also called an annual well check. Dental exams once or twice a year. Routine eye exams. Ask your health care provider how often you should have your eyes checked. Personal lifestyle choices, including: Daily care of your teeth and gums. Regular physical activity. Eating a healthy  diet. Avoiding tobacco and drug use. Limiting alcohol use. Practicing safe sex. Taking low-dose aspirin daily starting at age 86. Taking vitamin and mineral supplements as recommended by your health care provider. What happens during an annual well check? The services and screenings done by your health care provider during your annual well check will depend on your age, overall health, lifestyle risk factors, and family history of disease. Counseling  Your health care provider may ask you questions about your: Alcohol use. Tobacco use. Drug use. Emotional well-being. Home and relationship well-being. Sexual activity. Eating habits. Work and work Astronomer. Method of birth control. Menstrual cycle. Pregnancy history. Screening  You may have the following tests or measurements: Height, weight, and BMI. Blood pressure. Lipid and cholesterol levels. These may be checked every 5 years, or more frequently if you are over 77 years old. Skin check. Lung cancer screening. You may have this screening every year starting at age 29 if you have a 30-pack-year history of smoking and currently smoke or have quit within the past 15 years. Fecal occult blood test (FOBT) of the stool. You may have this test every year starting at age 41. Flexible sigmoidoscopy or colonoscopy. You may have a sigmoidoscopy every 5 years or a colonoscopy every 10 years starting at age 76. Hepatitis C blood test. Hepatitis B blood test. Sexually transmitted disease (STD) testing. Diabetes screening. This is done by checking your blood sugar (glucose) after you have not eaten for a while (fasting). You may have this done every 1-3 years. Mammogram. This may be done every 1-2 years. Talk to your health care provider about when you should start having regular mammograms. This may depend on whether you have a family history of breast cancer. BRCA-related cancer screening. This may be done if you have a family history of  breast, ovarian, tubal, or peritoneal cancers. Pelvic exam and Pap test. This may be done every 3 years starting at age 7. Starting at age 92, this may be done every 5 years if you have a Pap test in combination with an HPV test. Bone density scan. This is done to screen for osteoporosis. You may have this scan if you are  at high risk for osteoporosis. Discuss your test results, treatment options, and if necessary, the need for more tests with your health care provider. Vaccines  Your health care provider may recommend certain vaccines, such as: Influenza vaccine. This is recommended every year. Tetanus, diphtheria, and acellular pertussis (Tdap, Td) vaccine. You may need a Td booster every 10 years. Zoster vaccine. You may need this after age 63. Pneumococcal 13-valent conjugate (PCV13) vaccine. You may need this if you have certain conditions and were not previously vaccinated. Pneumococcal polysaccharide (PPSV23) vaccine. You may need one or two doses if you smoke cigarettes or if you have certain conditions. Talk to your health care provider about which screenings and vaccines you need and how often you need them. This information is not intended to replace advice given to you by your health care provider. Make sure you discuss any questions you have with your health care provider. Document Released: 01/14/2015 Document Revised: 09/07/2015 Document Reviewed: 10/19/2014 Elsevier Interactive Patient Education  2017 [employer].    Fall Prevention in the Home Falls can cause injuries. They can happen to people of all ages. There are many things you can do to make your home safe and to help prevent falls. What can I do on the outside of my home? Regularly fix the edges of walkways and driveways and fix any cracks. Remove anything that might make you trip as you walk through a door, such as a raised step or threshold. Trim any bushes or trees on the path to your home. Use bright outdoor  lighting. Clear any walking paths of anything that might make someone trip, such as rocks or tools. Regularly check to see if handrails are loose or broken. Make sure that both sides of any steps have handrails. Any raised decks and porches should have guardrails on the edges. Have any leaves, snow, or ice cleared regularly. Use sand or salt on walking paths during winter. Clean up any spills in your garage right away. This includes oil or grease spills. What can I do in the bathroom? Use night lights. Install grab bars by the toilet and in the tub and shower. Do not use towel bars as grab bars. Use non-skid mats or decals in the tub or shower. If you need to sit down in the shower, use a plastic, non-slip stool. Keep the floor dry. Clean up any water that spills on the floor as soon as it happens. Remove soap buildup in the tub or shower regularly. Attach bath mats securely with double-sided non-slip rug tape. Do not have throw rugs and other things on the floor that can make you trip. What can I do in the bedroom? Use night lights. Make sure that you have a light by your bed that is easy to reach. Do not use any sheets or blankets that are too big for your bed. They should not hang down onto the floor. Have a firm chair that has side arms. You can use this for support while you get dressed. Do not have throw rugs and other things on the floor that can make you trip. What can I do in the kitchen? Clean up any spills right away. Avoid walking on wet floors. Keep items that you use a lot in easy-to-reach places. If you need to reach something above you, use a strong step stool that has a grab bar. Keep electrical cords out of the way. Do not use floor polish or wax that makes floors slippery. If  you must use wax, use non-skid floor wax. Do not have throw rugs and other things on the floor that can make you trip. What can I do with my stairs? Do not leave any items on the stairs. Make  sure that there are handrails on both sides of the stairs and use them. Fix handrails that are broken or loose. Make sure that handrails are as long as the stairways. Check any carpeting to make sure that it is firmly attached to the stairs. Fix any carpet that is loose or worn. Avoid having throw rugs at the top or bottom of the stairs. If you do have throw rugs, attach them to the floor with carpet tape. Make sure that you have a light switch at the top of the stairs and the bottom of the stairs. If you do not have them, ask someone to add them for you. What else can I do to help prevent falls? Wear shoes that: Do not have high heels. Have rubber bottoms. Are comfortable and fit you well. Are closed at the toe. Do not wear sandals. If you use a stepladder: Make sure that it is fully opened. Do not climb a closed stepladder. Make sure that both sides of the stepladder are locked into place. Ask someone to hold it for you, if possible. Clearly mark and make sure that you can see: Any grab bars or handrails. First and last steps. Where the edge of each step is. Use tools that help you move around (mobility aids) if they are needed. These include: Canes. Walkers. Scooters. Crutches. Turn on the lights when you go into a dark area. Replace any light bulbs as soon as they burn out. Set up your furniture so you have a clear path. Avoid moving your furniture around. If any of your floors are uneven, fix them. If there are any pets around you, be aware of where they are. Review your medicines with your doctor. Some medicines can make you feel dizzy. This can increase your chance of falling. Ask your doctor what other things that you can do to help prevent falls. This information is not intended to replace advice given to you by your health care provider. Make sure you discuss any questions you have with your health care provider. Document Released: 10/14/2008 Document Revised: 05/26/2015  Document Reviewed: 01/22/2014 Elsevier Interactive Patient Education  2017 [employer].

## 2022-08-07 NOTE — Progress Notes (Signed)
 Because this visit was a virtual/telehealth visit,  certain criteria was not obtained, such a blood pressure, CBG if patient is a diabetic, and timed up and go. Any medications not marked as "taking" was not mentioned during the medication reconciliation part of the visit. Any vitals not documented were not able to be obtained due to this being a telehealth visit. Per patient no change in vitals since last visit, unable to obtain new vitals due to telehealth visit.   Subjective:   Karen Aguilar is a 57 y.o. female who presents for Medicare Annual (Subsequent) preventive examination.  Visit Complete: Virtual  I connected with  Karen Aguilar on 08/07/22 by a audio enabled telemedicine application and verified that I am speaking with the correct person using two identifiers.  Patient Location: Home  Provider Location: Home Office  I discussed the limitations of evaluation and management by telemedicine. The patient expressed understanding and agreed to proceed.  Patient Medicare AWV questionnaire was completed by the patient on n/a; I have confirmed that all information answered by patient is correct and no changes since this date.  Review of Systems     Cardiac Risk Factors include: dyslipidemia;hypertension;sedentary lifestyle     Objective:    Today's Vitals   08/07/22 1101 08/07/22 1102  BP: 120/80   Weight: 175 lb (79.4 kg)   Height: 5\' 7"  (1.702 m)   PainSc:  6    Body mass index is 27.41 kg/m.     08/07/2022   11:01 AM 06/02/2021    2:21 PM 04/22/2021    6:06 PM 02/20/2021    3:04 PM 07/20/2020   12:03 PM 09/02/2019   10:22 AM 07/04/2019   10:24 AM  Advanced Directives  Does Patient Have a Medical Advance Directive? Yes Yes No Yes No Yes Yes  Type of Estate agent of [place];Living will Healthcare Power of [place];Living will  Living will  Healthcare Power of [place];Living will Living will;Healthcare Power of Attorney  Does patient want to make  changes to medical advance directive? No - Patient declined     No - Patient declined   Copy of Healthcare Power of Attorney in Chart? Yes - validated most recent copy scanned in chart (See row information) Yes - validated most recent copy scanned in chart (See row information)    Yes - validated most recent copy scanned in chart (See row information)   Would patient like information on creating a medical advance directive?    No - Patient declined No - Patient declined  No - Patient declined    Current Medications (verified) Outpatient Encounter Medications as of 08/07/2022  Medication Sig   Ascorbic Acid (VITAMIN C PO) Take 1 tablet by mouth as needed (for immune system support).    baclofen (LIORESAL) 10 MG tablet Take 0.5 tablets (5 mg total) by mouth 3 (three) times daily as needed.   calcium carbonate (TUMS EX) 750 MG chewable tablet Chew 2-3 tablets by mouth 2 (two) times daily as needed.   camphor-menthol (SARNA) lotion Apply 1 application topically at bedtime as needed for itching.    Carboxymethylcellulose Sodium (EYE DROPS OP) Apply to eye. PRGF eye drops. 1 drop 4-6 times a day in left eye   Cholecalciferol (VITAMIN D) 2000 units CAPS Take 1 capsule (2,000 Units total) by mouth daily.   diclofenac sodium (VOLTAREN) 1 % GEL APPLY THIN LAYER EXTERNALLY TO THE AFFECTED AREA UP TO FOUR TIMES DAILY AS NEEDED   docusate  sodium (COLACE) 100 MG capsule Take 100 mg by mouth 2 (two) times daily. As needed   LORazepam (ATIVAN) 1 MG tablet Take 0.5-1 tablets (0.5-1 mg total) by mouth 2 (two) times daily as needed for anxiety.   NONFORMULARY OR COMPOUNDED ITEM Hydroeye supplement - with fish oil, vitamin C, A, E, B6   NUCYNTA ER 250 MG TB12 SMARTSIG:1 Tablet(s) By Mouth Every 12 Hours   ondansetron (ZOFRAN-ODT) 4 MG disintegrating tablet Take 1 tablet (4 mg total) by mouth every 8 (eight) hours as needed for nausea or vomiting.   Potassium 99 MG TABS Take 1 tablet (99 mg total) by mouth daily.    RESTASIS 0.05 % ophthalmic emulsion Place 1 drop into both eyes 2 (two) times daily.   thyroid (ARMOUR) 90 MG tablet Take 1 tablet (90 mg total) by mouth daily. One day a week take 1/2 tablet   TYRVAYA 0.03 MG/ACT SOLN Place 1 spray into both nostrils 2 (two) times daily. (Patient not taking: Reported on 08/07/2022)   No facility-administered encounter medications on file as of 08/07/2022.    Allergies (verified) Augmentin [amoxicillin-pot clavulanate], Fluconazole, Erythromycin, Influenza vaccines, Levothyroxine, Thimerosal (thiomersal), Tomato, Adhesive [tape], Azithromycin, Gabapentin, Imitrex [sumatriptan], Lyrica [pregabalin], Macrodantin [nitrofurantoin], Macrolides and ketolides, Nickel, Sulfa antibiotics, Sulfasalazine, Troleandomycin, and Vancomycin   History: Past Medical History:  Diagnosis Date   Abnormal MRI scan, bone 2012   abnormal marrow signal humeral head, glenoid and scapula - diffuse replacement of fatty marrow - likely benign, no need for further investigation ([place])   Agnosia for temperature    Right Leg   Anemia    etiology unknown-iron infusion in past,last 9/12   Anxiety    Anxiety and depression    Arachnoiditis    S1 nerve root, 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 [facility]   COVID-19 virus infection 09/2018   DDD (degenerative disc disease), cervical    DDD (degenerative disc disease), lumbar    s/p permanent nerve damage after back surgery   Depression    Depression with anxiety    Diverticulosis    Eczema    Family history of adverse reaction to anesthesia    Mother - nausea   Fibromyalgia    Foot drop    right - numbness, tingling - twisting   Gallstones    GERD (gastroesophageal reflux disease)    rare zantac PRN N/V at times with this   Headache(784.0)    migraines   Hemorrhoid    Hemorrhoids    History of kidney stones    Hyperlipidemia    Hypothyroidism 1990s   hashimoto's thyroiditis   IBS  (irritable bowel syndrome)    IBS (irritable bowel syndrome)    Incontinence of urine    s/p sling procedure-unresolved   Iron deficiency    h/o anemia, s/p iron infusion (2012)   Liver hemangioma     x2   Memory change    due to medications   Migraines    and h/o optical migraine   Migraines    Mixed urge and stress incontinence 2008   extensive workup including urodynamics, failed miltiple anticholinergics including myrbetriq and urogesic blue s/p PTNS, normal cystoscopy 2014, seen by Dr Sherron Monday and Dr Apolinar Junes   Neurogenic bladder 2008   after 13 spine surgeries with periph neuropathy and chronic LBP   Neuropathy of lower extremity 2011   Nocturia    Notalgia paresthetica    Osteoarthritis  12/2013   PONV (postoperative nausea and vomiting)    Pulmonary nodule 04/2012   7.41mm RLL nodule - resolved on imaging 12/2012   Sleep apnea    mild-doesn't use cpap   Urine frequency    Vitamin D deficiency    Past Surgical History:  Procedure Laterality Date   ANTERIOR CERVICAL DECOMP/DISCECTOMY FUSION     C3, 4, 5 - improved after surgery   ANTERIOR CERVICAL DECOMP/DISCECTOMY FUSION  07/2017   C6/7 Dr Rise Mu   ARTERY BIOPSY Left 09/02/2019   Procedure: BIOPSY TEMPORAL ARTERY;  Surgeon: Carolan Shiver, MD;  Location: [facility] ORS;  Service: General;  Laterality: Left;   BACK SURGERY     multiple-L4-s1,hardware,fusion,removal,infection s/p 5/11 procedure   BACK SURGERY  02/16/2011   "my 7th back OR; Procedure: Exploration of fusion removal of hardware L4-S1, harvesting of right iliac crest bone graft, redo posterior lateral fusion L4-5 using iliac crest bone graft BMP and master graft replacement of bilateral L4 screws with a Telfa tech 7.5 x 40 mm screw on the right and 8.5 x 40 mm screw on the left placement of a large Hemovac drain   CESAREAN SECTION  1996; 2001   x 2   CHOLECYSTECTOMY  2001   COLONOSCOPY  10/2016   rpt 10 yrs Marva Panda)   COLONOSCOPY  03/2019   1 polyp,  biopsies taken ([place])   ESOPHAGEAL MANOMETRY  06/2017   WNL (Dr Sharon Seller)   ESOPHAGOGASTRODUODENOSCOPY  03/2013   early esophageal stricture dilated, o/w WNL Arlyce Dice)   ESOPHAGOGASTRODUODENOSCOPY  12/2014   WNL, s/p empiric dilation of esophagus Marina Goodell)   ESOPHAGOGASTRODUODENOSCOPY  10/2015   WNL, biopsies WNL as well (minimal esophagitis with reactive changes) Elnoria Howard)   ESOPHAGOGASTRODUODENOSCOPY  10/2016   biopsy suggestive of ulcer ([place])   ESOPHAGOGASTRODUODENOSCOPY  03/2019   mild portal hypertensive gastropathy, gastric erosion without bleeding, duodenal diverticulum ([place])   ESOPHAGOGASTRODUODENOSCOPY  09/2020   normal esophagus with mild edema, no gastritis, H pylori neg (Dr Andrey Campanile at [facility])   ESOPHAGOGASTRODUODENOSCOPY  08/31/2020   biopsy with reactive gastropathy ([facility])   HEMORRHOID BANDING     LASIK Bilateral    LUMBAR WOUND DEBRIDEMENT  03/15/2011   Procedure: LUMBAR WOUND DEBRIDEMENT;  Surgeon: [name], MD;  Lumbar Wound Debridement   MANDIBLE RECONSTRUCTION  1986   PFT  01/28/2013   WNL   PUBOVAGINAL SLING  2008   midurethral sling   SHOULDER CLOSED REDUCTION Left 12/18/2010   Procedure: CLOSED MANIPULATION SHOULDER;  Surgeon: Javier Docker; labrial debridement   SPINAL CORD STIMULATOR INSERTION N/A 08/19/2015   Procedure: LUMBAR SPINAL CORD STIMULATOR INSERTION;  Surgeon: Maeola Harman, MD;  Location: [facility];  Service: Neurosurgery;  Laterality: N/A;  LUMBAR SPINAL CORD STIMULATOR INSERTION   SPINE SURGERY     TONSILLECTOMY  1972   as a child; "then they grew back"   Family History  Problem Relation Age of Onset   Lung cancer Father 55        (smoker)   Diabetes Brother    Thyroid disease Mother        ?empty sella   Irritable bowel syndrome Mother    Dysphagia Mother    CAD Maternal Grandfather        MI   Heart disease Maternal Grandfather    Heart attack Maternal Grandfather    Lung cancer Paternal Grandfather         (smoker)   Colon  cancer Paternal Grandmother  Thyroid disease Cousin    Cancer Other        unsure (ovarian/uterine)   Breast cancer Maternal Aunt    Alzheimer's disease Maternal Grandmother    Diabetes Daughter    Social History   Socioeconomic History   Marital status: Married    Spouse name: Not on file   Number of children: 2   Years of education: Not on file   Highest education level: Not on file  Occupational History   Occupation: disabled  Tobacco Use   Smoking status: Never    Passive exposure: Past (as a child)   Smokeless tobacco: Never  Vaping Use   Vaping status: Never Used  Substance and Sexual Activity   Alcohol use: Yes    Alcohol/week: 0.0 standard drinks of alcohol    Comment: rarely drinks wine   Drug use: No   Sexual activity: Not Currently  Other Topics Concern   Not on file  Social History Narrative   Lives with husband and 2 daughters)   Daughter of Arley Phenix   Occupation: was Chemical engineer   Disability since 2011 - arachnoiditis   Edu: associate's degree   Activity: severe limitations 2/2 arachnoiditis and chronic pain, walks to end of driveway and back   Diet: good water, fruits/vegetables daily   Social Determinants of Health   Financial Resource Strain: Low Risk  (08/07/2022)   Overall Financial Resource Strain (CARDIA)    Difficulty of Paying Living Expenses: Not hard at all  Food Insecurity: No Food Insecurity (08/07/2022)   Hunger Vital Sign    Worried About Running Out of Food in the Last Year: Never true    Ran Out of Food in the Last Year: Never true  Transportation Needs: No Transportation Needs (08/07/2022)   PRAPARE - Administrator, Civil Service (Medical): No    Lack of Transportation (Non-Medical): No  Physical Activity: Inactive (08/07/2022)   Exercise Vital Sign    Days of Exercise per Week: 0 days    Minutes of Exercise per Session: 0 min  Stress: Stress Concern Present (08/07/2022)   [employer] of  Occupational Health - Occupational Stress Questionnaire    Feeling of Stress : Very much  Social Connections: Patient Declined (08/07/2022)   Social Connection and Isolation Panel [NHANES]    Frequency of Communication with Friends and Family: Patient declined    Frequency of Social Gatherings with Friends and Family: Patient declined    Attends Religious Services: Patient declined    Database administrator or Organizations: Patient declined    Attends Engineer, structural: Patient declined    Marital Status: Patient declined    Tobacco Counseling Counseling given: Yes   Clinical Intake:  Pre-visit preparation completed: Yes  Pain : 0-10 Pain Score: 6  Pain Type: Neuropathic pain Pain Descriptors / Indicators: Aching, Constant Pain Onset: More than a month ago Pain Frequency: Constant     BMI - recorded: 27.41 Nutritional Status: BMI 25 -29 Overweight Nutritional Risks: None Diabetes: No  How often do you need to have someone help you when you read instructions, pamphlets, or other written materials from your doctor or pharmacy?: 1 - Never  Interpreter Needed?: No  Information entered by ::  Nachum Derossett, CMA   Activities of Daily Living    08/07/2022   11:13 AM  In your present state of health, do you have any difficulty performing the following activities:  Hearing? 0  Vision? 0  Difficulty concentrating or making decisions? 1  Walking or climbing stairs? 1  Dressing or bathing? 1  Doing errands, shopping? 1  Comment husband takes her where she needs to go  Preparing Food and eating ? Y  Comment husband does all of the cooking  Using the Toilet? N  In the past six months, have you accidently leaked urine? N  Do you have problems with loss of bowel control? N  Managing your Medications? N  Managing your Finances? N  Housekeeping or managing your Housekeeping? Y  Comment husband does all of the cleaning    Patient Care Team: Eustaquio Boyden, MD  as PCP - General (Family Medicine) Maeola Harman, MD as Consulting Physician (Neurosurgery) Donalee Citrin, MD as Consulting Physician (Neurosurgery) Irena Cords, Enzo Montgomery, MD as Consulting Physician (Allergy and Immunology) Tedd Sias Marlana Salvage, MD as Physician Assistant (Internal Medicine) Thyra Breed, MD as Consulting Physician (Anesthesiology)  Indicate any recent Medical Services you may have received from other than [facility] providers in the past year (date may be approximate).     Assessment:   This is a routine wellness examination for Jayani.  Hearing/Vision screen Hearing Screening - Comments:: Patient denies any hearing difficulties.    Dietary issues and exercise activities discussed:     Goals Addressed             This Visit's Progress    Patient Stated       "Remain active"       Depression Screen    08/07/2022   11:07 AM 07/09/2022    4:57 PM 02/21/2022    3:57 PM 06/02/2021    2:23 PM 06/01/2020    4:40 PM 10/12/2016   11:03 AM 10/05/2015   10:45 AM  PHQ 2/9 Scores  PHQ - 2 Score 0 4 4 3 4 5 4   PHQ- 9 Score 13 16 16 16 14 17 21     Fall Risk    08/07/2022   11:12 AM 07/09/2022    4:57 PM 02/21/2022    3:57 PM 06/02/2021    2:22 PM 05/31/2021    5:53 PM  Fall Risk   Falls in the past year? 0 0 0 0 0  Number falls in past yr: 0  0 0   Injury with Fall? 0  0 0   Risk for fall due to : No Fall Risks;Impaired balance/gait;Impaired mobility  No Fall Risks Impaired balance/gait;Impaired mobility;Medication side effect   Follow up Falls prevention discussed;Education provided  Falls evaluation completed Falls evaluation completed;Education provided;Falls prevention discussed     MEDICARE RISK AT HOME:  Medicare Risk at Home - 08/07/22 1111     Any stairs in or around the home? No    If so, are there any without handrails? No    Home free of loose throw rugs in walkways, pet beds, electrical cords, etc? Yes    Adequate lighting in your home to reduce risk of falls? Yes     Life alert? No    Use of a cane, walker or w/c? Yes    Grab bars in the bathroom? Yes    Shower chair or bench in shower? Yes   patient has a safety tub   Elevated toilet seat or a handicapped toilet? No             TIMED UP AND GO:  Was the test performed?  No    Cognitive Function:    10/12/2016   11:00 AM 10/05/2015  11:00 AM  MMSE - Mini Mental State Exam  Orientation to time 5 5  Orientation to Place 5 5  Registration 3 3  Attention/ Calculation 0 0  Recall 1 3  Recall-comments unable to recall 2 of 3 words   Language- name 2 objects 0 0  Language- repeat 1 1  Language- follow 3 step command 3 3  Language- read & follow direction 0 0  Write a sentence 0 0  Copy design 0 0  Total score 18 20        08/07/2022   11:05 AM 06/02/2021    2:28 PM  6CIT Screen  What Year? 0 points 0 points  What month? 0 points 0 points  What time? 0 points 0 points  Count back from 20 0 points 0 points  Months in reverse 0 points 0 points  Repeat phrase 0 points 4 points  Total Score 0 points 4 points    Immunizations  There is no immunization history on file for this patient.  TDAP status: Up to date  Flu Vaccine status: Due, Education has been provided regarding the importance of this vaccine. Advised may receive this vaccine at local pharmacy or Health Dept. Aware to provide a copy of the vaccination record if obtained from local pharmacy or Health Dept. Verbalized acceptance and understanding.  Pneumococcal vaccine status: NOT AGE APPROPRIATE FOR THIS PATIENT  Covid-19 vaccine status: Information provided on how to obtain vaccines.   Qualifies for Shingles Vaccine? Yes   Zostavax completed No   Shingrix Completed?: No.    Education has been provided regarding the importance of this vaccine. Patient has been advised to call insurance company to determine out of pocket expense if they have not yet received this vaccine. Advised may also receive vaccine at local  pharmacy or Health Dept. Verbalized acceptance and understanding.  Screening Tests Health Maintenance  Topic Date Due   Zoster Vaccines- Shingrix (1 of 2) Never done   PAP SMEAR-Modifier  08/17/2021   COVID-19 Vaccine (1 - 2023-24 season) Never done   Medicare Annual Wellness (AWV)  06/03/2022   DTaP/Tdap/Td (1 - Tdap) 10/13/2026 (Originally 05/01/1984)   COLON CANCER SCREENING ANNUAL FOBT  07/07/2023   MAMMOGRAM  11/15/2023   Colonoscopy  03/09/2029   Hepatitis C Screening  Completed   HIV Screening  Completed   HPV VACCINES  Aged Out    Health Maintenance  Health Maintenance Due  Topic Date Due   Zoster Vaccines- Shingrix (1 of 2) Never done   PAP SMEAR-Modifier  08/17/2021   COVID-19 Vaccine (1 - 2023-24 season) Never done   Medicare Annual Wellness (AWV)  06/03/2022    Colorectal cancer screening: Type of screening: FOBT/FIT. Completed 07/07/2022. Repeat every 1 years Patient had colonoscopy on 03/10/2019. Repeat every 10 years Mammogram status: Completed 11/14/2021. Repeat every year  Bone Density Screening: NOT AGE APPROPRIATE FOR THIS PATIENT   Lung Cancer Screening: (Low Dose CT Chest recommended if Age 10-80 years, 20 pack-year currently smoking OR have quit w/in 15years.) does not qualify.    Additional Screening:  Hepatitis C Screening: does not qualify; Completed 09/09/2019  Vision Screening: Recommended annual ophthalmology exams for early detection of glaucoma and other disorders of the eye. Is the patient up to date with their annual eye exam?  Yes  Who is the provider or what is the name of the office in which the patient attends annual eye exams? [facility]  Dental Screening: Recommended annual dental exams  for proper oral hygiene  Diabetic Foot Exam: n/a  Community Resource Referral / Chronic Care Management: CRR required this visit?  No   CCM required this visit?  No     Plan:     I have personally reviewed and noted the following in the  patient's chart:   Medical and social history Use of alcohol, tobacco or illicit drugs  Current medications and supplements including opioid prescriptions. Patient is not currently taking opioid prescriptions. Functional ability and status Nutritional status Physical activity Advanced directives List of other physicians Hospitalizations, surgeries, and ER visits in previous 12 months Vitals Screenings to include cognitive, depression, and falls Referrals and appointments  In addition, I have reviewed and discussed with patient certain preventive protocols, quality metrics, and best practice recommendations. A written personalized care plan for preventive services as well as general preventive health recommendations were provided to patient.     Jordan Hawks Ching Rabideau, CMA   08/07/2022   After Visit Summary: (MyChart) Due to this being a telephonic visit, the after visit summary with patients personalized plan was offered to patient via MyChart

## 2022-08-08 ENCOUNTER — Encounter: Payer: Self-pay | Admitting: *Deleted

## 2022-08-22 ENCOUNTER — Other Ambulatory Visit: Payer: Self-pay | Admitting: Family Medicine

## 2022-08-22 DIAGNOSIS — F411 Generalized anxiety disorder: Secondary | ICD-10-CM

## 2022-08-24 MED ORDER — LORAZEPAM 1 MG PO TABS
0.5000 mg | ORAL_TABLET | Freq: Two times a day (BID) | ORAL | 1 refills | Status: DC | PRN
Start: 2022-08-24 — End: 2022-12-19

## 2022-08-24 NOTE — Telephone Encounter (Signed)
ERx 

## 2022-09-20 ENCOUNTER — Other Ambulatory Visit: Payer: Self-pay | Admitting: Family Medicine

## 2022-09-20 DIAGNOSIS — E063 Autoimmune thyroiditis: Secondary | ICD-10-CM

## 2022-09-20 DIAGNOSIS — E038 Other specified hypothyroidism: Secondary | ICD-10-CM

## 2022-09-20 DIAGNOSIS — E559 Vitamin D deficiency, unspecified: Secondary | ICD-10-CM

## 2022-09-20 DIAGNOSIS — E611 Iron deficiency: Secondary | ICD-10-CM

## 2022-09-20 DIAGNOSIS — E7800 Pure hypercholesterolemia, unspecified: Secondary | ICD-10-CM

## 2022-09-24 ENCOUNTER — Other Ambulatory Visit: Payer: Medicare Other

## 2022-10-01 ENCOUNTER — Encounter: Payer: Medicare Other | Admitting: Family Medicine

## 2022-10-01 ENCOUNTER — Other Ambulatory Visit: Payer: Medicare Other

## 2022-10-02 ENCOUNTER — Other Ambulatory Visit (INDEPENDENT_AMBULATORY_CARE_PROVIDER_SITE_OTHER): Payer: Medicare Other

## 2022-10-02 DIAGNOSIS — E611 Iron deficiency: Secondary | ICD-10-CM | POA: Diagnosis not present

## 2022-10-02 DIAGNOSIS — E559 Vitamin D deficiency, unspecified: Secondary | ICD-10-CM | POA: Diagnosis not present

## 2022-10-02 DIAGNOSIS — E7800 Pure hypercholesterolemia, unspecified: Secondary | ICD-10-CM

## 2022-10-02 LAB — CBC WITH DIFFERENTIAL/PLATELET
Basophils Absolute: 0 10*3/uL (ref 0.0–0.1)
Basophils Relative: 0 % (ref 0.0–3.0)
Eosinophils Absolute: 0 10*3/uL (ref 0.0–0.7)
Eosinophils Relative: 0 % (ref 0.0–5.0)
HCT: 42.5 % (ref 36.0–46.0)
Hemoglobin: 13.9 g/dL (ref 12.0–15.0)
Lymphocytes Relative: 22.7 % (ref 12.0–46.0)
Lymphs Abs: 1.3 10*3/uL (ref 0.7–4.0)
MCHC: 32.7 g/dL (ref 30.0–36.0)
MCV: 88.5 fL (ref 78.0–100.0)
Monocytes Absolute: 0.3 10*3/uL (ref 0.1–1.0)
Monocytes Relative: 6 % (ref 3.0–12.0)
Neutro Abs: 4.1 10*3/uL (ref 1.4–7.7)
Neutrophils Relative %: 71.3 % (ref 43.0–77.0)
Platelets: 369 10*3/uL (ref 150.0–400.0)
RBC: 4.81 Mil/uL (ref 3.87–5.11)
RDW: 13.2 % (ref 11.5–15.5)
WBC: 5.7 10*3/uL (ref 4.0–10.5)

## 2022-10-02 LAB — COMPREHENSIVE METABOLIC PANEL
ALT: 43 U/L — ABNORMAL HIGH (ref 0–35)
AST: 22 U/L (ref 0–37)
Albumin: 4.3 g/dL (ref 3.5–5.2)
Alkaline Phosphatase: 97 U/L (ref 39–117)
BUN: 12 mg/dL (ref 6–23)
CO2: 33 meq/L — ABNORMAL HIGH (ref 19–32)
Calcium: 9.5 mg/dL (ref 8.4–10.5)
Chloride: 98 meq/L (ref 96–112)
Creatinine, Ser: 0.99 mg/dL (ref 0.40–1.20)
GFR: 63.78 mL/min (ref >60.00)
Glucose, Bld: 95 mg/dL (ref 70–99)
Potassium: 3.9 meq/L (ref 3.5–5.1)
Sodium: 139 meq/L (ref 135–145)
Total Bilirubin: 1 mg/dL (ref 0.2–1.2)
Total Protein: 6.9 g/dL (ref 6.0–8.3)

## 2022-10-02 LAB — LIPID PANEL
Cholesterol: 242 mg/dL — ABNORMAL HIGH (ref 0–200)
HDL: 69.9 mg/dL (ref >39.00)
LDL Cholesterol: 153 mg/dL — ABNORMAL HIGH (ref 0–99)
NonHDL: 172.05
Total CHOL/HDL Ratio: 3
Triglycerides: 95 mg/dL (ref 0.0–149.0)
VLDL: 19 mg/dL (ref 0.0–40.0)

## 2022-10-02 LAB — IBC PANEL
Iron: 112 ug/dL (ref 42–145)
Saturation Ratios: 28.5 % (ref 20.0–50.0)
TIBC: 393.4 ug/dL (ref 250.0–450.0)
Transferrin: 281 mg/dL (ref 212.0–360.0)

## 2022-10-02 LAB — FERRITIN: Ferritin: 37.5 ng/mL (ref 10.0–291.0)

## 2022-10-02 LAB — VITAMIN D 25 HYDROXY (VIT D DEFICIENCY, FRACTURES): VITD: 35.98 ng/mL (ref 30.00–100.00)

## 2022-10-04 ENCOUNTER — Other Ambulatory Visit: Payer: Medicare Other

## 2022-10-10 ENCOUNTER — Ambulatory Visit: Payer: Medicare Other | Admitting: Family Medicine

## 2022-10-10 ENCOUNTER — Encounter: Payer: Self-pay | Admitting: Family Medicine

## 2022-10-10 VITALS — BP 134/84 | HR 105 | Temp 97.9°F | Ht 67.0 in | Wt 180.2 lb

## 2022-10-10 DIAGNOSIS — E063 Autoimmune thyroiditis: Secondary | ICD-10-CM | POA: Diagnosis not present

## 2022-10-10 DIAGNOSIS — Z0000 Encounter for general adult medical examination without abnormal findings: Secondary | ICD-10-CM

## 2022-10-10 DIAGNOSIS — Z0001 Encounter for general adult medical examination with abnormal findings: Secondary | ICD-10-CM

## 2022-10-10 DIAGNOSIS — N3946 Mixed incontinence: Secondary | ICD-10-CM

## 2022-10-10 DIAGNOSIS — I700 Atherosclerosis of aorta: Secondary | ICD-10-CM | POA: Diagnosis not present

## 2022-10-10 DIAGNOSIS — K219 Gastro-esophageal reflux disease without esophagitis: Secondary | ICD-10-CM

## 2022-10-10 DIAGNOSIS — G500 Trigeminal neuralgia: Secondary | ICD-10-CM

## 2022-10-10 DIAGNOSIS — E559 Vitamin D deficiency, unspecified: Secondary | ICD-10-CM | POA: Diagnosis not present

## 2022-10-10 DIAGNOSIS — E611 Iron deficiency: Secondary | ICD-10-CM

## 2022-10-10 DIAGNOSIS — Z7189 Other specified counseling: Secondary | ICD-10-CM

## 2022-10-10 DIAGNOSIS — G43109 Migraine with aura, not intractable, without status migrainosus: Secondary | ICD-10-CM

## 2022-10-10 DIAGNOSIS — M85832 Other specified disorders of bone density and structure, left forearm: Secondary | ICD-10-CM

## 2022-10-10 DIAGNOSIS — G4730 Sleep apnea, unspecified: Secondary | ICD-10-CM

## 2022-10-10 DIAGNOSIS — E7800 Pure hypercholesterolemia, unspecified: Secondary | ICD-10-CM | POA: Diagnosis not present

## 2022-10-10 DIAGNOSIS — F411 Generalized anxiety disorder: Secondary | ICD-10-CM

## 2022-10-10 DIAGNOSIS — H04123 Dry eye syndrome of bilateral lacrimal glands: Secondary | ICD-10-CM

## 2022-10-10 DIAGNOSIS — G894 Chronic pain syndrome: Secondary | ICD-10-CM

## 2022-10-10 DIAGNOSIS — N319 Neuromuscular dysfunction of bladder, unspecified: Secondary | ICD-10-CM

## 2022-10-10 DIAGNOSIS — M797 Fibromyalgia: Secondary | ICD-10-CM

## 2022-10-10 DIAGNOSIS — H168 Other keratitis: Secondary | ICD-10-CM

## 2022-10-10 DIAGNOSIS — F331 Major depressive disorder, recurrent, moderate: Secondary | ICD-10-CM

## 2022-10-10 MED ORDER — ATORVASTATIN CALCIUM 20 MG PO TABS
20.0000 mg | ORAL_TABLET | ORAL | 3 refills | Status: DC
Start: 2022-10-10 — End: 2023-02-11

## 2022-10-10 MED ORDER — KETOROLAC TROMETHAMINE 30 MG/ML IJ SOLN
30.0000 mg | Freq: Once | INTRAMUSCULAR | Status: AC
Start: 2022-10-10 — End: 2022-10-10
  Administered 2022-10-10: 30 mg via INTRAMUSCULAR

## 2022-10-10 NOTE — Assessment & Plan Note (Signed)
Reviewed recent DEXA with patient.  Encouraged good calcium intake ideally most in diet.

## 2022-10-10 NOTE — Assessment & Plan Note (Signed)
Appreciate endo care.  

## 2022-10-10 NOTE — Assessment & Plan Note (Addendum)
Chronic, not on statin. With recent aortic ATH noted on imaging study, she agrees to try low dose once weekly atorvastatin 20mg .  The 10-year ASCVD risk score (Arnett DK, et al., 2019) is: 2.3%   Values used to calculate the score:     Age: 57 years     Sex: Female     Is Non-Hispanic African American: No     Diabetic: No     Tobacco smoker: No     Systolic Blood Pressure: 134 mmHg     Is BP treated: No     HDL Cholesterol: 69.9 mg/dL     Total Cholesterol: 242 mg/dL

## 2022-10-10 NOTE — Progress Notes (Signed)
After obtaining consent, and per orders of Dr. Sharen Hones, injection of Ketorolac 30mg  given IM in right upper outer quadrant by Valentino Nose. Patient tolerated injection well.  Reported that she has taking Ketorolac injections before.

## 2022-10-10 NOTE — Assessment & Plan Note (Signed)
Continues lorazepam.

## 2022-10-10 NOTE — Patient Instructions (Addendum)
Toradol 30mg  IM today.  We will request colonoscopy polyp pathology report from 03/2019 at [facility] (Dr Haze Boyden) Try atorvastatin 20mg  once weekly for cholesterol control.  Sleep questionnaire today.  Good to see you today  Return in 4 months for follow up visit

## 2022-10-10 NOTE — Assessment & Plan Note (Signed)
H/o mild sleep apnea based on remote sleep study age 65s.  Did not tolerate CPAP at that time due to worsening migraines. Symptoms not consistent with significant OSA, ESS today = 4.

## 2022-10-10 NOTE — Assessment & Plan Note (Signed)
Previously discussed.

## 2022-10-10 NOTE — Assessment & Plan Note (Signed)
Sees [facility] Dr Vear Clock.

## 2022-10-10 NOTE — Progress Notes (Signed)
Ph: [phone] Fax: [phone]   Patient ID: Karen Aguilar, female    DOB: [birth year], 57 y.o.   MRN: [id number]  This visit was conducted in person.  BP 134/84 (BP Location: Left Arm, Patient Position: Sitting, Cuff Size: Normal)   Pulse (!) 105   Temp 97.9 F (36.6 C) (Oral)   Ht 5\' 7"  (1.702 m)   Wt 180 lb 4 oz (81.8 kg)   LMP 10/04/2014 Comment: per labs  SpO2 97%   BMI 28.23 kg/m    CC: CPE Subjective:   HPI: Karen Aguilar is a 57 y.o. female presenting on 10/10/2022 for Annual Exam   Saw health advisor 08/2022 for medicare wellness visit. Note reviewed.   No results found.  Flowsheet Row Office Visit from 10/10/2022 in [facility] [place]  PHQ-2 Total Score 5          10/10/2022   12:38 PM 08/07/2022   11:12 AM 07/09/2022    4:57 PM 02/21/2022    3:57 PM 06/02/2021    2:22 PM  Fall Risk   Falls in the past year? 0 0 0 0 0  Number falls in past yr: 0 0  0 0  Injury with Fall? 0 0  0 0  Risk for fall due to : No Fall Risks No Fall Risks;Impaired balance/gait;Impaired mobility  No Fall Risks Impaired balance/gait;Impaired mobility;Medication side effect  Follow up Falls evaluation completed Falls prevention discussed;Education provided  Falls evaluation completed Falls evaluation completed;Education provided;Falls prevention discussed  Migraine over the past 1 week. Notes worsening neck/shoulder pain with radiation to head, L jaw, L face and temples. H/o negative temporal artery biopsy 09/2019. + photo/phonophobia, nausea without vomiting. Activity limiting.   H/o GERD and bile reflux previously improved with carafate and nexium. Last seen for televisit 08/2022, stable period - plan f/u Dr Sharon Seller PRN.   Known neurotrophic keratitis of L eye in setting of L trigeminal neuralgia with facial residual numbness. Treating keratitis with PRGF QID. Sees [facility].   She has appt tomorrow with [facility]) for elevated  ANA. Anti-DNA, C3/C4, CPK, and anti SSA/SSB returned normal.   Worsening migraines - possibly related to cervical pinched nerve.  Has been discussing migraine management with pain clinic Dr Vear Clock - had previously decided not to try Nurtec due to concern over worsening GI upset.   Saw [facility] neurosurgery Dr Adriana Simas 09/2022 for opinion on surgical management for chronic cervical thoracic and lumbar spine pain despite ACDF and spinal cord stimulator to thoracic spine. Recommend against further surgeries at this time. To consider cervical spinal cord stimulator. Thought component of chronic regional pain syndrome.   Hypothyroidism followed by endo Dr Tedd Sias due to Hashimoto's thyroiditis on armour thyroid 90mg  daily, once weekly only taking 1/2 tab.   Preventative: COLONOSCOPY 10/2016 rpt 10 yrs [facility])  COLONOSCOPY 03/2019 - 1 polyp, biopsies taken ([place]) - sees Dr Sharon Seller GI.  Well woman with [facility]. Normal pap 08/2018, Q5 yrs. Post-menopausal confirmed by hormone testing.  LMP sometime 2016 - lab confirmed menopause  Mammogram 11/2021 [facility] @ [place]. Fibrous breasts - strong fmhx breast cancer (maternal aunt, maternal great aunt). DEXA 07/2022 - L forearm -2.0, RTF -1.4, not at increased risk of fracture  Lung cancer screening - not eligible  Flu shot - declines - has reacted previously - merisol preservative COVID vaccine - discussed, declined Tetanus - declines due to coverage  Shingrix - declines  Advanced planning -  Received advanced directives 09/2014, scanned. HCPOA is Karen Aguilar then mother Karen Aguilar. Does not want life prolonging measures if terminally ill.  Seat belt use discussed  Sunscreen use discussed. No changing moles on skin.  Non smoker  Alcohol - rarely  Dentist - q6 mo - significant recent dental work  Clinical research associate - sees regularly for neurotrophic keratitis  Bowel - intermittent constipation > diarrhea  Bladder - both urge and stress incontinence -  with accidents, regularly wears pad.   Lives with husband and 2 daughters (one in college) Occupation: was Chemical engineer, on disability since 2011 - arachnoiditis Edu: associate's degree Activity: severe limitations 2/2 arachnoiditis and chronic pain, walks to end of driveway and back  Diet: good water, fruits/vegetables daily      Relevant past medical, surgical, family and social history reviewed and updated as indicated. Interim medical history since our last visit reviewed. Allergies and medications reviewed and updated. Outpatient Medications Prior to Visit  Medication Sig Dispense Refill   Ascorbic Acid (VITAMIN C PO) Take 1 tablet by mouth as needed (for immune system support).      baclofen (LIORESAL) 10 MG tablet Take 0.5 tablets (5 mg total) by mouth 3 (three) times daily as needed.     calcium carbonate (TUMS EX) 750 MG chewable tablet Chew 2-3 tablets by mouth 2 (two) times daily as needed.     camphor-menthol (SARNA) lotion Apply 1 application topically at bedtime as needed for itching.      Carboxymethylcellulose Sodium (EYE DROPS OP) Apply to eye. PRGF eye drops. 1 drop 4-6 times a day in left eye     Cholecalciferol (VITAMIN D) 2000 units CAPS Take 1 capsule (2,000 Units total) by mouth daily. 30 capsule    diclofenac sodium (VOLTAREN) 1 % GEL APPLY THIN LAYER EXTERNALLY TO THE AFFECTED AREA UP TO FOUR TIMES DAILY AS NEEDED 300 g 0   docusate sodium (COLACE) 100 MG capsule Take 100 mg by mouth 2 (two) times daily. As needed     LORazepam (ATIVAN) 1 MG tablet Take 0.5-1 tablets (0.5-1 mg total) by mouth 2 (two) times daily as needed for anxiety. 60 tablet 1   NONFORMULARY OR COMPOUNDED ITEM Hydroeye supplement - with fish oil, vitamin C, A, E, B6     NUCYNTA ER 250 MG TB12 SMARTSIG:1 Tablet(s) By Mouth Every 12 Hours     ondansetron (ZOFRAN-ODT) 4 MG disintegrating tablet Take 1 tablet (4 mg total) by mouth every 8 (eight) hours as needed for nausea or vomiting. 20  tablet 0   Potassium 99 MG TABS Take 1 tablet (99 mg total) by mouth daily.  0   thyroid (ARMOUR) 90 MG tablet Take 1 tablet (90 mg total) by mouth daily. One day a week take 1/2 tablet     TYRVAYA 0.03 MG/ACT SOLN Place 1 spray into both nostrils 2 (two) times daily.     RESTASIS 0.05 % ophthalmic emulsion Place 1 drop into both eyes 2 (two) times daily.     No facility-administered medications prior to visit.     Per HPI unless specifically indicated in ROS section below Review of Systems  Constitutional:  Positive for fever. Negative for activity change, appetite change, chills, fatigue and unexpected weight change.  HENT:  Positive for congestion. Negative for hearing loss.        Recent COVID  Eyes:  Negative for visual disturbance.  Respiratory:  Positive for cough (recent COVID related). Negative for chest tightness, shortness  of breath and wheezing.   Cardiovascular:  Positive for chest pain (acid reflux related). Negative for palpitations and leg swelling.  Gastrointestinal:  Positive for constipation, diarrhea and nausea. Negative for abdominal distention, abdominal pain, blood in stool and vomiting.  Endocrine: Positive for heat intolerance. Negative for cold intolerance.  Genitourinary:  Negative for difficulty urinating and hematuria.  Musculoskeletal:  Negative for arthralgias, myalgias and neck pain.  Skin:  Negative for rash.  Neurological:  Positive for dizziness and headaches. Negative for seizures and syncope.  Hematological:  Negative for adenopathy. Bruises/bleeds easily.  Psychiatric/Behavioral:  Negative for dysphoric mood. The patient is nervous/anxious.     Objective:  BP 134/84 (BP Location: Left Arm, Patient Position: Sitting, Cuff Size: Normal)   Pulse (!) 105   Temp 97.9 F (36.6 C) (Oral)   Ht 5\' 7"  (1.702 m)   Wt 180 lb 4 oz (81.8 kg)   LMP 10/04/2014 Comment: per labs  SpO2 97%   BMI 28.23 kg/m   Wt Readings from Last 3 Encounters:  10/10/22 180  lb 4 oz (81.8 kg)  08/07/22 175 lb (79.4 kg)  07/09/22 175 lb 6 oz (79.5 kg)      Physical Exam Vitals and nursing note reviewed.  Constitutional:      Appearance: Normal appearance. She is not ill-appearing.  HENT:     Head: Normocephalic and atraumatic.     Right Ear: Tympanic membrane, ear canal and external ear normal. There is no impacted cerumen.     Left Ear: Tympanic membrane, ear canal and external ear normal. There is no impacted cerumen.     Mouth/Throat:     Mouth: Mucous membranes are moist.     Pharynx: Oropharynx is clear. No oropharyngeal exudate or posterior oropharyngeal erythema.  Eyes:     General:        Right eye: No discharge.        Left eye: No discharge.     Extraocular Movements: Extraocular movements intact.     Conjunctiva/sclera: Conjunctivae normal.     Pupils: Pupils are equal, round, and reactive to light.  Neck:     Thyroid: No thyroid mass or thyromegaly.     Vascular: No carotid bruit.  Cardiovascular:     Rate and Rhythm: Normal rate and regular rhythm.     Pulses: Normal pulses.     Heart sounds: Normal heart sounds. No murmur heard. Pulmonary:     Effort: Pulmonary effort is normal. No respiratory distress.     Breath sounds: Normal breath sounds. No wheezing, rhonchi or rales.  Abdominal:     General: Bowel sounds are normal. There is no distension.     Palpations: Abdomen is soft. There is no mass.     Tenderness: There is no abdominal tenderness. There is no guarding or rebound.     Hernia: No hernia is present.  Musculoskeletal:     Cervical back: Normal range of motion and neck supple. No rigidity.     Right lower leg: No edema.     Left lower leg: No edema.  Lymphadenopathy:     Cervical: No cervical adenopathy.  Skin:    General: Skin is warm and dry.     Findings: No rash.  Neurological:     General: No focal deficit present.     Mental Status: She is alert. Mental status is at baseline.  Psychiatric:        Mood and  Affect: Mood normal.  Behavior: Behavior normal.       Results for orders placed or performed in visit on 10/02/22  VITAMIN D 25 Hydroxy (Vit-D Deficiency, Fractures)  Result Value Ref Range   VITD 35.98 30.00 - 100.00 ng/mL  CBC with Differential/Platelet  Result Value Ref Range   WBC 5.7 4.0 - 10.5 K/uL   RBC 4.81 3.87 - 5.11 Mil/uL   Hemoglobin 13.9 12.0 - 15.0 g/dL   HCT 40.9 81.1 - 91.4 %   MCV 88.5 78.0 - 100.0 fl   MCHC 32.7 30.0 - 36.0 g/dL   RDW 78.2 95.6 - 21.3 %   Platelets 369.0 150.0 - 400.0 K/uL   Neutrophils Relative % 71.3 43.0 - 77.0 %   Lymphocytes Relative 22.7 12.0 - 46.0 %   Monocytes Relative 6.0 3.0 - 12.0 %   Eosinophils Relative 0.0 0.0 - 5.0 %   Basophils Relative 0.0 0.0 - 3.0 %   Neutro Abs 4.1 1.4 - 7.7 K/uL   Lymphs Abs 1.3 0.7 - 4.0 K/uL   Monocytes Absolute 0.3 0.1 - 1.0 K/uL   Eosinophils Absolute 0.0 0.0 - 0.7 K/uL   Basophils Absolute 0.0 0.0 - 0.1 K/uL  IBC panel  Result Value Ref Range   Iron 112 42 - 145 ug/dL   Transferrin 086.5 784.6 - 360.0 mg/dL   Saturation Ratios 96.2 20.0 - 50.0 %   TIBC 393.4 250.0 - 450.0 mcg/dL  Ferritin  Result Value Ref Range   Ferritin 37.5 10.0 - 291.0 ng/mL  Comprehensive metabolic panel  Result Value Ref Range   Sodium 139 135 - 145 mEq/L   Potassium 3.9 3.5 - 5.1 mEq/L   Chloride 98 96 - 112 mEq/L   CO2 33 (H) 19 - 32 mEq/L   Glucose, Bld 95 70 - 99 mg/dL   BUN 12 6 - 23 mg/dL   Creatinine, Ser 9.52 0.40 - 1.20 mg/dL   Total Bilirubin 1.0 0.2 - 1.2 mg/dL   Alkaline Phosphatase 97 39 - 117 U/L   AST 22 0 - 37 U/L   ALT 43 (H) 0 - 35 U/L   Total Protein 6.9 6.0 - 8.3 g/dL   Albumin 4.3 3.5 - 5.2 g/dL   GFR 84.13 >24.40 mL/min   Calcium 9.5 8.4 - 10.5 mg/dL  Lipid panel  Result Value Ref Range   Cholesterol 242 (H) 0 - 200 mg/dL   Triglycerides 10.2 0.0 - 149.0 mg/dL   HDL 72.53 >66.44 mg/dL   VLDL 03.4 0.0 - 74.2 mg/dL   LDL Cholesterol 595 (H) 0 - 99 mg/dL   Total CHOL/HDL Ratio  3    NonHDL 172.05    Lab Results  Component Value Date   TSH 0.93 07/20/2021   Assessment & Plan:   Problem List Items Addressed This Visit     Advanced care planning/counseling discussion (Chronic)    Previously discussed.       Health maintenance examination - Primary (Chronic)    Preventative protocols reviewed and updated unless pt declined. Discussed healthy diet and lifestyle.       Hypothyroidism due to Hashimoto's thyroiditis    Appreciate endo care.       Fibromyalgia   GERD (gastroesophageal reflux disease)    Stable period off daily PPI.       Chronic pain syndrome    Sees [facility] Dr Vear Clock.       Sleep apnea    H/o mild sleep apnea based on remote sleep study  age 89s.  Did not tolerate CPAP at that time due to worsening migraines. Symptoms not consistent with significant OSA, ESS today = 4.      MDD (major depressive disorder), recurrent episode, moderate (HCC)    Chronic only on lorazepam.       Hyperlipidemia    Chronic, not on statin. With recent aortic ATH noted on imaging study, she agrees to try low dose once weekly atorvastatin 20mg .  The 10-year ASCVD risk score (Arnett DK, et al., 2019) is: 2.3%   Values used to calculate the score:     Age: 17 years     Sex: Female     Is Non-Hispanic African American: No     Diabetic: No     Tobacco smoker: No     Systolic Blood Pressure: 134 mmHg     Is BP treated: No     HDL Cholesterol: 69.9 mg/dL     Total Cholesterol: 242 mg/dL       Relevant Medications   atorvastatin (LIPITOR) 20 MG tablet   Complicated migraine    History of this, today's migraine is more typical. Treat with Toradol IM 30 mg x 1 today, avoid Phenergan as patient drove here.  She has oral Phenergan to take at home as well as Zofran as needed.      Relevant Medications   atorvastatin (LIPITOR) 20 MG tablet   Vitamin D deficiency    Continues daily vit D 2000 international units  with benefit.        Iron deficiency    Iron levels stay stable off replacement.       Neurogenic bladder   Mixed urge and stress incontinence   GAD (generalized anxiety disorder)    Continues lorazepam.       Chronically dry eyes, bilateral   Trigeminal neuralgia   Neurotrophic keratitis of left eye   Osteopenia    Reviewed recent DEXA with patient.  Encouraged good calcium intake ideally most in diet.       Aortic atherosclerosis (HCC)    Noted incidentally on recent CT lumbar spine.  Discussed with patient, she is willing to try low dose once weekly statin. See below.       Relevant Medications   atorvastatin (LIPITOR) 20 MG tablet     Meds ordered this encounter  Medications   atorvastatin (LIPITOR) 20 MG tablet    Sig: Take 1 tablet (20 mg total) by mouth once a week.    Dispense:  13 tablet    Refill:  3   ketorolac (TORADOL) 30 MG/ML injection 30 mg    No orders of the defined types were placed in this encounter.   Patient Instructions  Toradol 30mg  IM today.  We Aguilar request colonoscopy polyp pathology report from 03/2019 at [facility] (Dr Haze Boyden) Try atorvastatin 20mg  once weekly for cholesterol control.  Sleep questionnaire today.  Good to see you today  Return in 4 months for follow up visit   Follow up plan: Return in about 4 months (around 02/10/2023) for follow up visit.  Eustaquio Boyden, MD

## 2022-10-10 NOTE — Assessment & Plan Note (Signed)
Preventative protocols reviewed and updated unless pt declined. Discussed healthy diet and lifestyle.  

## 2022-10-10 NOTE — Assessment & Plan Note (Signed)
Iron levels stay stable off replacement.

## 2022-10-10 NOTE — Assessment & Plan Note (Signed)
Noted incidentally on recent CT lumbar spine.  Discussed with patient, she is willing to try low dose once weekly statin. See below.

## 2022-10-10 NOTE — Assessment & Plan Note (Addendum)
Continues daily vit D 2000 international units  with benefit.

## 2022-10-10 NOTE — Assessment & Plan Note (Signed)
Stable period off daily PPI.

## 2022-10-10 NOTE — Assessment & Plan Note (Signed)
Chronic only on lorazepam.

## 2022-10-10 NOTE — Assessment & Plan Note (Signed)
History of this, today's migraine is more typical. Treat with Toradol IM 30 mg x 1 today, avoid Phenergan as patient drove here.  She has oral Phenergan to take at home as well as Zofran as needed.

## 2022-10-19 ENCOUNTER — Other Ambulatory Visit: Payer: Self-pay | Admitting: Family Medicine

## 2022-10-19 DIAGNOSIS — Z1231 Encounter for screening mammogram for malignant neoplasm of breast: Secondary | ICD-10-CM

## 2022-11-20 ENCOUNTER — Ambulatory Visit
Admission: RE | Admit: 2022-11-20 | Discharge: 2022-11-20 | Disposition: A | Discharge status: Home / Self Care | Payer: Medicare Other | Source: Ambulatory Visit | Attending: Family Medicine | Admitting: Family Medicine

## 2022-11-20 DIAGNOSIS — Z1231 Encounter for screening mammogram for malignant neoplasm of breast: Secondary | ICD-10-CM | POA: Insufficient documentation

## 2022-12-18 ENCOUNTER — Other Ambulatory Visit: Payer: Self-pay | Admitting: Family Medicine

## 2022-12-18 DIAGNOSIS — F411 Generalized anxiety disorder: Secondary | ICD-10-CM

## 2022-12-18 NOTE — Telephone Encounter (Signed)
Name of Medication:  Lorazepam Name of Pharmacy:  [facility] Last Fill or Written Date and Quantity:  09/20/22, #60 Last Office Visit and Type:  10/10/22, CPE Next Office Visit and Type:  02/11/23, 4 mo f/u Last Controlled Substance Agreement Date:  06/10/13 Last UDS: 06/10/13, scanned

## 2022-12-19 NOTE — Telephone Encounter (Signed)
ERx 

## 2023-02-11 ENCOUNTER — Encounter: Payer: Self-pay | Admitting: Family Medicine

## 2023-02-11 ENCOUNTER — Ambulatory Visit: Payer: Medicare Other | Admitting: Family Medicine

## 2023-02-11 VITALS — BP 126/82 | HR 96 | Temp 98.9°F | Ht 67.0 in | Wt 193.0 lb

## 2023-02-11 DIAGNOSIS — H168 Other keratitis: Secondary | ICD-10-CM

## 2023-02-11 DIAGNOSIS — Z6379 Other stressful life events affecting family and household: Secondary | ICD-10-CM

## 2023-02-11 DIAGNOSIS — G500 Trigeminal neuralgia: Secondary | ICD-10-CM | POA: Diagnosis not present

## 2023-02-11 DIAGNOSIS — E7800 Pure hypercholesterolemia, unspecified: Secondary | ICD-10-CM | POA: Diagnosis not present

## 2023-02-11 DIAGNOSIS — H04123 Dry eye syndrome of bilateral lacrimal glands: Secondary | ICD-10-CM

## 2023-02-11 MED ORDER — EZETIMIBE 10 MG PO TABS: 10.0000 mg | ORAL_TABLET | Freq: Every day | ORAL | 11 refills | Status: DC

## 2023-02-11 NOTE — Patient Instructions (Addendum)
 Trial ezetimibe  10mg  tablets sent to pharmacy - start a few times a week, goal once daily dosing.  Good to see you today Return in 4 months for follow up visit   Schedule appointment for your mom for evaluation.

## 2023-02-11 NOTE — Progress Notes (Signed)
Ph: [phone] Fax: [phone]   Patient ID: Karen Aguilar, female    DOB: 03/02/1965, 58 y.o.   MRN: [id number]  This visit was conducted in person.  BP 126/82   Pulse 96   Temp 98.9 F (37.2 C) (Oral)   Ht 5\' 7"  (1.702 m)   Wt 193 lb (87.5 kg)   LMP 10/04/2014 Comment: per labs  SpO2 96%   BMI 30.23 kg/m    CC: 4 mo f/u visit  Subjective:   HPI: Karen Aguilar is a 58 y.o. female presenting on 02/11/2023 for Medical Management of Chronic Issues (Here for 4 mo f/u.)   Known neurotrophic keratitis of left eye in setting of L trigeminal neuralgia with residual facial numbness. Sees [facility] ophtho and rheum for keratoconjunctivitis sicca with elevated ANA. Other rheum labs were normal. Newly seeing Dr Jackie Plum ocular immunology at [facility]. Now using PRGF drops to bilateral eyes. On Tyrvaya nasal spray for dry eyes.   Hypothyroidism managed by endo Dr Tedd Sias.  H/o migraines - currently off medications due to GI upset.  H/o negative temporal artery biopsy 09/2019.  Regularly on baclofen 5mg  TID.  Did not tolerate once weekly atorvastatin 20mg .   Saw [facility] neurosurgery Dr Adriana Simas 09/2022 for opinion on surgical management for chronic cervical thoracic and lumbar spine pain despite ACDF and spinal cord stimulator to thoracic spine. Recommend against further surgeries at this time. To consider cervical spinal cord stimulator. Thought component of chronic regional pain syndrome.   Worried about mom s/p L AFN and concern for memory problems. Fmhx alz     Relevant past medical, surgical, family and social history reviewed and updated as indicated. Interim medical history since our last visit reviewed. Allergies and medications reviewed and updated. Outpatient Medications Prior to Visit  Medication Sig Dispense Refill   Ascorbic Acid (VITAMIN C PO) Take 1 tablet by mouth as needed (for immune system support).      baclofen (LIORESAL) 10 MG tablet Take 0.5 tablets (5 mg total) by mouth 3 (three)  times daily as needed.     calcium carbonate (TUMS EX) 750 MG chewable tablet Chew 2-3 tablets by mouth 2 (two) times daily as needed.     camphor-menthol (SARNA) lotion Apply 1 application topically at bedtime as needed for itching.      Carboxymethylcellulose Sodium (EYE DROPS OP) Apply to eye. PRGF eye drops. 1 drop 4-6 times a day in left eye     Cholecalciferol (VITAMIN D) 2000 units CAPS Take 1 capsule (2,000 Units total) by mouth daily. 30 capsule    diclofenac sodium (VOLTAREN) 1 % GEL APPLY THIN LAYER EXTERNALLY TO THE AFFECTED AREA UP TO FOUR TIMES DAILY AS NEEDED 300 g 0   docusate sodium (COLACE) 100 MG capsule Take 100 mg by mouth 2 (two) times daily. As needed     LORazepam (ATIVAN) 1 MG tablet TAKE 1/2-1 TABLET BY MOUTH TWICE DAILY AS NEEDED FOR ANXIETY 60 tablet 0   NONFORMULARY OR COMPOUNDED ITEM Hydroeye supplement - with fish oil, vitamin C, A, E, B6     NUCYNTA ER 250 MG TB12 SMARTSIG:1 Tablet(s) By Mouth Every 12 Hours     ondansetron (ZOFRAN-ODT) 4 MG disintegrating tablet Take 1 tablet (4 mg total) by mouth every 8 (eight) hours as needed for nausea or vomiting. 20 tablet 0   Potassium 99 MG TABS Take 1 tablet (99 mg total) by mouth daily.  0   thyroid (ARMOUR) 90 MG tablet Take  1 tablet (90 mg total) by mouth daily. One day a week take 1/2 tablet     TYRVAYA 0.03 MG/ACT SOLN Place 1 spray into both nostrils 2 (two) times daily.     atorvastatin (LIPITOR) 20 MG tablet Take 1 tablet (20 mg total) by mouth once a week. (Patient not taking: Reported on 02/11/2023) 13 tablet 3   No facility-administered medications prior to visit.     Per HPI unless specifically indicated in ROS section below Review of Systems  Objective:  BP 126/82   Pulse 96   Temp 98.9 F (37.2 C) (Oral)   Ht 5\' 7"  (1.702 m)   Wt 193 lb (87.5 kg)   LMP 10/04/2014 Comment: per labs  SpO2 96%   BMI 30.23 kg/m   Wt Readings from Last 3 Encounters:  02/11/23 193 lb (87.5 kg)  10/10/22 180 lb 4 oz  (81.8 kg)  08/07/22 175 lb (79.4 kg)      Physical Exam Vitals and nursing note reviewed.  Constitutional:      Appearance: Normal appearance. She is not ill-appearing.  HENT:     Mouth/Throat:     Mouth: Mucous membranes are moist.     Pharynx: Oropharynx is clear. No oropharyngeal exudate or posterior oropharyngeal erythema.  Eyes:     Extraocular Movements: Extraocular movements intact.     Pupils: Pupils are equal, round, and reactive to light.  Cardiovascular:     Rate and Rhythm: Normal rate and regular rhythm.     Pulses: Normal pulses.     Heart sounds: Normal heart sounds. No murmur heard. Pulmonary:     Effort: Pulmonary effort is normal. No respiratory distress.     Breath sounds: Normal breath sounds. No wheezing, rhonchi or rales.  Musculoskeletal:     Right lower leg: No edema.     Left lower leg: No edema.  Skin:    General: Skin is warm and dry.     Findings: No rash.  Neurological:     Mental Status: She is alert.  Psychiatric:        Mood and Affect: Mood normal.        Behavior: Behavior normal.       Results for orders placed or performed in visit on 10/02/22  VITAMIN D 25 Hydroxy (Vit-D Deficiency, Fractures)   Collection Time: 10/02/22 10:52 AM  Result Value Ref Range   VITD 35.98 30.00 - 100.00 ng/mL  CBC with Differential/Platelet   Collection Time: 10/02/22 10:52 AM  Result Value Ref Range   WBC 5.7 4.0 - 10.5 K/uL   RBC 4.81 3.87 - 5.11 Mil/uL   Hemoglobin 13.9 12.0 - 15.0 g/dL   HCT 91.4 78.2 - 95.6 %   MCV 88.5 78.0 - 100.0 fl   MCHC 32.7 30.0 - 36.0 g/dL   RDW 21.3 08.6 - 57.8 %   Platelets 369.0 150.0 - 400.0 K/uL   Neutrophils Relative % 71.3 43.0 - 77.0 %   Lymphocytes Relative 22.7 12.0 - 46.0 %   Monocytes Relative 6.0 3.0 - 12.0 %   Eosinophils Relative 0.0 0.0 - 5.0 %   Basophils Relative 0.0 0.0 - 3.0 %   Neutro Abs 4.1 1.4 - 7.7 K/uL   Lymphs Abs 1.3 0.7 - 4.0 K/uL   Monocytes Absolute 0.3 0.1 - 1.0 K/uL   Eosinophils  Absolute 0.0 0.0 - 0.7 K/uL   Basophils Absolute 0.0 0.0 - 0.1 K/uL  IBC panel   Collection Time: 10/02/22 10:52  AM  Result Value Ref Range   Iron 112 42 - 145 ug/dL   Transferrin 762.8 315.1 - 360.0 mg/dL   Saturation Ratios 76.1 20.0 - 50.0 %   TIBC 393.4 250.0 - 450.0 mcg/dL  Ferritin   Collection Time: 10/02/22 10:52 AM  Result Value Ref Range   Ferritin 37.5 10.0 - 291.0 ng/mL  Comprehensive metabolic panel   Collection Time: 10/02/22 10:52 AM  Result Value Ref Range   Sodium 139 135 - 145 mEq/L   Potassium 3.9 3.5 - 5.1 mEq/L   Chloride 98 96 - 112 mEq/L   CO2 33 (H) 19 - 32 mEq/L   Glucose, Bld 95 70 - 99 mg/dL   BUN 12 6 - 23 mg/dL   Creatinine, Ser 6.07 0.40 - 1.20 mg/dL   Total Bilirubin 1.0 0.2 - 1.2 mg/dL   Alkaline Phosphatase 97 39 - 117 U/L   AST 22 0 - 37 U/L   ALT 43 (H) 0 - 35 U/L   Total Protein 6.9 6.0 - 8.3 g/dL   Albumin 4.3 3.5 - 5.2 g/dL   GFR 37.10 >62.69 mL/min   Calcium 9.5 8.4 - 10.5 mg/dL  Lipid panel   Collection Time: 10/02/22 10:52 AM  Result Value Ref Range   Cholesterol 242 (H) 0 - 200 mg/dL   Triglycerides 48.5 0.0 - 149.0 mg/dL   HDL 46.27 >03.50 mg/dL   VLDL 09.3 0.0 - 81.8 mg/dL   LDL Cholesterol 299 (H) 0 - 99 mg/dL   Total CHOL/HDL Ratio 3    NonHDL 172.05     Assessment & Plan:   Problem List Items Addressed This Visit     Hyperlipidemia - Primary   Atorvastatin intolerance, even once weekly dose. Will trial ezetimibe 10mg  daily. The 10-year ASCVD risk score (Arnett DK, et al., 2019) is: 2.1%   Values used to calculate the score:     Age: 34 years     Sex: Female     Is Non-Hispanic African American: No     Diabetic: No     Tobacco smoker: No     Systolic Blood Pressure: 126 mmHg     Is BP treated: No     HDL Cholesterol: 69.9 mg/dL     Total Cholesterol: 242 mg/dL       Relevant Medications   ezetimibe (ZETIA) 10 MG tablet   Chronically dry eyes, bilateral   Sees [facility] - ocular immunology and  rheumatology, thought possible seronegative sjogren's vs keratoconjunctivitis sicca.      Trigeminal neuralgia   This is thought to contribute to eye issues.       Neurotrophic keratitis of left eye   Has established with Dr Jackie Plum ocular immunologist at [facility].  Continues PRGF drops to both eyes.  Also using Tyrvaya nasal spray with benefit.       Stress due to illness of family member   Discussed caregiver stress with her mother.         Meds ordered this encounter  Medications   DISCONTD: ezetimibe (ZETIA) 10 MG tablet    Sig: Take 1 tablet (10 mg total) by mouth daily.    Dispense:  30 tablet    Refill:  11   ezetimibe (ZETIA) 10 MG tablet    Sig: Take 1 tablet (10 mg total) by mouth daily.    Dispense:  30 tablet    Refill:  11    To replace atorvastatin    No orders of the  defined types were placed in this encounter.   Patient Instructions  Trial ezetimibe 10mg  tablets sent to pharmacy - start a few times a week, goal once daily dosing.  Good to see you today Return in 4 months for follow up visit   Schedule appointment for your mom for evaluation.   Follow up plan: Return in about 4 months (around 06/11/2023), or if symptoms worsen or fail to improve, for follow up visit.  Eustaquio Boyden, MD

## 2023-02-12 DIAGNOSIS — Z6379 Other stressful life events affecting family and household: Secondary | ICD-10-CM | POA: Insufficient documentation

## 2023-02-12 NOTE — Assessment & Plan Note (Signed)
This is thought to contribute to eye issues.

## 2023-02-12 NOTE — Assessment & Plan Note (Signed)
Discussed caregiver stress with her mother.

## 2023-02-12 NOTE — Assessment & Plan Note (Signed)
Atorvastatin intolerance, even once weekly dose. Will trial ezetimibe 10mg  daily. The 10-year ASCVD risk score (Arnett DK, et al., 2019) is: 2.1%   Values used to calculate the score:     Age: 58 years     Sex: Female     Is Non-Hispanic African American: No     Diabetic: No     Tobacco smoker: No     Systolic Blood Pressure: 126 mmHg     Is BP treated: No     HDL Cholesterol: 69.9 mg/dL     Total Cholesterol: 242 mg/dL

## 2023-02-12 NOTE — Assessment & Plan Note (Signed)
Has established with Dr Jackie Plum ocular immunologist at [facility].  Continues PRGF drops to both eyes.  Also using Tyrvaya nasal spray with benefit.

## 2023-02-12 NOTE — Assessment & Plan Note (Signed)
Sees [facility] - ocular immunology and rheumatology, thought possible seronegative sjogren's vs keratoconjunctivitis sicca.

## 2023-02-14 ENCOUNTER — Other Ambulatory Visit: Payer: Self-pay | Admitting: Family Medicine

## 2023-02-14 DIAGNOSIS — F411 Generalized anxiety disorder: Secondary | ICD-10-CM

## 2023-02-14 NOTE — Telephone Encounter (Signed)
Name of Medication:  Lorazepam Name of Pharmacy:  [facility] Last Fill or Written Date and Quantity:  12/19/22, #60 Last Office Visit and Type:  02/11/23, 4 mo f/u Next Office Visit and Type:  06/11/23, 4 mo f/u Last Controlled Substance Agreement Date:  06/10/13 Last UDS: 06/10/13, scanned

## 2023-02-15 MED ORDER — LORAZEPAM 1 MG PO TABS
1.0000 mg | ORAL_TABLET | Freq: Two times a day (BID) | ORAL | 0 refills | Status: DC | PRN
Start: 2023-02-15 — End: 2023-04-12

## 2023-02-15 NOTE — Telephone Encounter (Signed)
ERx

## 2023-03-08 ENCOUNTER — Encounter: Payer: Self-pay | Admitting: Family Medicine

## 2023-03-08 ENCOUNTER — Ambulatory Visit: Payer: Medicare Other | Admitting: Family Medicine

## 2023-03-08 VITALS — BP 132/80 | HR 100 | Temp 98.7°F | Ht 67.0 in | Wt 194.0 lb

## 2023-03-08 DIAGNOSIS — R252 Cramp and spasm: Secondary | ICD-10-CM

## 2023-03-08 DIAGNOSIS — H9193 Unspecified hearing loss, bilateral: Secondary | ICD-10-CM

## 2023-03-08 DIAGNOSIS — E7800 Pure hypercholesterolemia, unspecified: Secondary | ICD-10-CM

## 2023-03-08 DIAGNOSIS — R5382 Chronic fatigue, unspecified: Secondary | ICD-10-CM

## 2023-03-08 DIAGNOSIS — M797 Fibromyalgia: Secondary | ICD-10-CM

## 2023-03-08 DIAGNOSIS — E063 Autoimmune thyroiditis: Secondary | ICD-10-CM

## 2023-03-08 DIAGNOSIS — R002 Palpitations: Secondary | ICD-10-CM | POA: Diagnosis not present

## 2023-03-08 DIAGNOSIS — I700 Atherosclerosis of aorta: Secondary | ICD-10-CM

## 2023-03-08 LAB — COMPREHENSIVE METABOLIC PANEL
ALT: 17 U/L (ref 0–35)
AST: 15 U/L (ref 0–37)
Albumin: 4.4 g/dL (ref 3.5–5.2)
Alkaline Phosphatase: 82 U/L (ref 39–117)
BUN: 16 mg/dL (ref 6–23)
CO2: 33 meq/L — ABNORMAL HIGH (ref 19–32)
Calcium: 9.5 mg/dL (ref 8.4–10.5)
Chloride: 99 meq/L (ref 96–112)
Creatinine, Ser: 0.95 mg/dL (ref 0.40–1.20)
GFR: 66.82 mL/min (ref >60.00)
Glucose, Bld: 94 mg/dL (ref 70–99)
Potassium: 4.1 meq/L (ref 3.5–5.1)
Sodium: 141 meq/L (ref 135–145)
Total Bilirubin: 0.5 mg/dL (ref 0.2–1.2)
Total Protein: 7.2 g/dL (ref 6.0–8.3)

## 2023-03-08 LAB — MAGNESIUM: Magnesium: 1.9 mg/dL (ref 1.5–2.5)

## 2023-03-08 LAB — C-REACTIVE PROTEIN: CRP: 1.6 mg/dL (ref 0.5–20.0)

## 2023-03-08 LAB — CK: Total CK: 76 U/L (ref 7–177)

## 2023-03-08 LAB — FERRITIN: Ferritin: 21.9 ng/mL (ref 10.0–291.0)

## 2023-03-08 LAB — SEDIMENTATION RATE: Sed Rate: 59 mm/h — ABNORMAL HIGH (ref 0–30)

## 2023-03-08 NOTE — Progress Notes (Signed)
 Ph: [phone] Fax: [phone]   Patient ID: Karen Aguilar, female    DOB: 06-22-1965, 58 y.o.   MRN: [id number]  This visit was conducted in person.  BP 132/80   Pulse 100   Temp 98.7 F (37.1 C) (Oral)   Ht 5\' 7"  (1.702 m)   Wt 194 lb (88 kg)   LMP 10/04/2014 Comment: per labs  SpO2 96%   BMI 30.38 kg/m    CC: HLD f/u visit, med intolerance Subjective:   HPI: Karen Aguilar is a 58 y.o. female presenting on 03/08/2023 for Medical Management of Chronic Issues (Here for cholesterol f/u. Wants to discuss Zetia- caused GI issues. Also, c/o occasional increased HR. )   See prior note for details.  Fmhx CAD - (maternal grandfather with MI age 75).  CT lumbar spine 07/2022 incidentally noted abdominal aortic atherosclerosis.  ABI 12/2016 WNL.  HLD - statin intolerance - did not tolerate even once weekly atorvastatin 20mg . Last visit we started ezetimibe a few times a week - caused constipation, R sided abd pain worse with meals.  Lab Results  Component Value Date   CHOL 242 (H) 10/02/2022   HDL 69.90 10/02/2022   LDLCALC 153 (H) 10/02/2022   TRIG 95.0 10/02/2022   CHOLHDL 3 10/02/2022   The 10-year ASCVD risk score (Arnett DK, et al., 2019) is: 2.3%   Values used to calculate the score:     Age: 31 years     Sex: Female     Is Non-Hispanic African American: No     Diabetic: No     Tobacco smoker: No     Systolic Blood Pressure: 132 mmHg     Is BP treated: No     HDL Cholesterol: 69.9 mg/dL     Total Cholesterol: 242 mg/dL   Has also been having exacerbation of muscle spasms to hands, neck, abdominal cramping. Associated with fatigue and trouble sleeping. This is despite baclofen 5mg  TID scheduled.  Doesn't tolerate oral iron due to GI symptoms.   Notes heart rate has been running higher than normal - up to 139 after mild exertion, lasted about 1 hour- associated with dizziness, lightheadedness, diaphoresis but no chest pain or syncope.   She has started wearing  hearing aides 2024 due to L>R hearing loss associated with tinnitus.      Relevant past medical, surgical, family and social history reviewed and updated as indicated. Interim medical history since our last visit reviewed. Allergies and medications reviewed and updated. Outpatient Medications Prior to Visit  Medication Sig Dispense Refill   Ascorbic Acid (VITAMIN C PO) Take 1 tablet by mouth as needed (for immune system support).      baclofen (LIORESAL) 10 MG tablet Take 0.5 tablets (5 mg total) by mouth 3 (three) times daily as needed.     calcium carbonate (TUMS EX) 750 MG chewable tablet Chew 2-3 tablets by mouth 2 (two) times daily as needed.     camphor-menthol (SARNA) lotion Apply 1 application topically at bedtime as needed for itching.      Carboxymethylcellulose Sodium (EYE DROPS OP) Apply to eye. PRGF eye drops. 1 drop 4-6 times a day in left eye     Cholecalciferol (VITAMIN D) 2000 units CAPS Take 1 capsule (2,000 Units total) by mouth daily. 30 capsule    diclofenac sodium (VOLTAREN) 1 % GEL APPLY THIN LAYER EXTERNALLY TO THE AFFECTED AREA UP TO FOUR TIMES DAILY AS NEEDED 300 g 0   docusate  sodium (COLACE) 100 MG capsule Take 100 mg by mouth 2 (two) times daily. As needed     LORazepam (ATIVAN) 1 MG tablet Take 1 tablet (1 mg total) by mouth 2 (two) times daily as needed for anxiety. 60 tablet 0   NONFORMULARY OR COMPOUNDED ITEM Hydroeye supplement - with fish oil, vitamin C, A, E, B6     NUCYNTA ER 250 MG TB12 SMARTSIG:1 Tablet(s) By Mouth Every 12 Hours     ondansetron (ZOFRAN-ODT) 4 MG disintegrating tablet Take 1 tablet (4 mg total) by mouth every 8 (eight) hours as needed for nausea or vomiting. 20 tablet 0   Potassium 99 MG TABS Take 1 tablet (99 mg total) by mouth daily.  0   thyroid (ARMOUR) 90 MG tablet Take 1 tablet (90 mg total) by mouth daily. One day a week take 1/2 tablet     TYRVAYA 0.03 MG/ACT SOLN Place 1 spray into both nostrils 2 (two) times daily.     ezetimibe  (ZETIA) 10 MG tablet Take 1 tablet (10 mg total) by mouth daily. (Patient not taking: Reported on 03/08/2023) 30 tablet 11   No facility-administered medications prior to visit.     Per HPI unless specifically indicated in ROS section below Review of Systems  Objective:  BP 132/80   Pulse 100   Temp 98.7 F (37.1 C) (Oral)   Ht 5\' 7"  (1.702 m)   Wt 194 lb (88 kg)   LMP 10/04/2014 Comment: per labs  SpO2 96%   BMI 30.38 kg/m   Wt Readings from Last 3 Encounters:  03/08/23 194 lb (88 kg)  02/11/23 193 lb (87.5 kg)  10/10/22 180 lb 4 oz (81.8 kg)      Physical Exam Vitals and nursing note reviewed.  Constitutional:      Appearance: Normal appearance. She is not ill-appearing.  HENT:     Head: Normocephalic and atraumatic.     Mouth/Throat:     Mouth: Mucous membranes are moist.     Pharynx: Oropharynx is clear. No oropharyngeal exudate or posterior oropharyngeal erythema.  Eyes:     Extraocular Movements: Extraocular movements intact.     Pupils: Pupils are equal, round, and reactive to light.  Neck:     Thyroid: No thyroid mass or thyromegaly.  Cardiovascular:     Rate and Rhythm: Normal rate and regular rhythm.     Pulses: Normal pulses.     Heart sounds: Normal heart sounds. No murmur heard. Pulmonary:     Effort: Pulmonary effort is normal. No respiratory distress.     Breath sounds: Normal breath sounds. No wheezing, rhonchi or rales.  Musculoskeletal:     Right lower leg: No edema.     Left lower leg: No edema.  Skin:    General: Skin is warm and dry.     Findings: No rash.  Neurological:     General: No focal deficit present.     Mental Status: She is alert.     Sensory: Sensation is intact.     Motor: Motor function is intact.     Coordination: Coordination is intact.     Gait: Gait is intact.     Comments:  CN 2-12 grossly intact 5/5 strength BUE, BLE Grip strength intact  Psychiatric:        Behavior: Behavior normal.       Results for orders  placed or performed in visit on 03/08/23  Comprehensive metabolic panel   Collection Time: 03/08/23 11:17  AM  Result Value Ref Range   Sodium 141 135 - 145 mEq/L   Potassium 4.1 3.5 - 5.1 mEq/L   Chloride 99 96 - 112 mEq/L   CO2 33 (H) 19 - 32 mEq/L   Glucose, Bld 94 70 - 99 mg/dL   BUN 16 6 - 23 mg/dL   Creatinine, Ser 4.69 0.40 - 1.20 mg/dL   Total Bilirubin 0.5 0.2 - 1.2 mg/dL   Alkaline Phosphatase 82 39 - 117 U/L   AST 15 0 - 37 U/L   ALT 17 0 - 35 U/L   Total Protein 7.2 6.0 - 8.3 g/dL   Albumin 4.4 3.5 - 5.2 g/dL   GFR 62.95 >28.41 mL/min   Calcium 9.5 8.4 - 10.5 mg/dL  Magnesium   Collection Time: 03/08/23 11:17 AM  Result Value Ref Range   Magnesium 1.9 1.5 - 2.5 mg/dL  CK   Collection Time: 03/08/23 11:17 AM  Result Value Ref Range   Total CK 76 7 - 177 U/L  Sedimentation rate   Collection Time: 03/08/23 11:17 AM  Result Value Ref Range   Sed Rate 59 (H) 0 - 30 mm/hr  C-reactive protein   Collection Time: 03/08/23 11:17 AM  Result Value Ref Range   CRP 1.6 0.5 - 20.0 mg/dL  Ferritin   Collection Time: 03/08/23 11:17 AM  Result Value Ref Range   Ferritin 21.9 10.0 - 291.0 ng/mL   Lab Results  Component Value Date   TSH 0.93 07/20/2021    Assessment & Plan:  [id number]: Patient's 10-year ASCVD risk score was calculated using the ASCVD Risk Estimator from the [employer] of [employer] using current lipid data (less than 12 months old). This assessment was performed due to predisposing cardiovascular risk factor of hyperlipidemia, abdominal aortic atherosclerosis. This is done for a patient without known pre-existing cardiovascular disease.  ASCVD 10 year risk calculated at: 2.3% Minutes spent in ASCVD risk discussion: 6 The 10-year ASCVD risk score (Arnett DK, et al., 2019) is: 2.3%   Values used to calculate the score:     Age: 30 years     Sex: Female     Is Non-Hispanic African American: No     Diabetic: No     Tobacco smoker: No     Systolic Blood  Pressure: 132 mmHg     Is BP treated: No     HDL Cholesterol: 69.9 mg/dL     Total Cholesterol: 242 mg/dL    Problem List Items Addressed This Visit     Hypothyroidism due to Hashimoto's thyroiditis   Thyroid disease is followed by endocrinologist Dr Tedd Sias      Fibromyalgia   Hyperlipidemia - Primary   Discussed recent med difficulties - h/o once weekly atorvastatin intolerance (GI upset) now also notes zetia intolerance (similar GI upset, abdominal pain). Will stay off this.  We started cholesterol medication despite low ASCVD risk in setting of elevated cholesterol levels and documented abdominal aortic atherosclerosis on prior lumbar CT scan.  Will order coronary calcium score for further risk stratification and to guide cholesterol treatment decision (trial bempedoic acid, PCSK9i, lipid clinic referral, etc.       Relevant Orders   CT CARDIAC SCORING (SELF PAY ONLY)   Palpitations   Newly noted tachypalpitations to 130s with minimal exertion.  Continue good hydration status. Discussed option of cardiac event monitor if ongoing symptoms.       Aortic atherosclerosis (HCC)   Muscle cramping   Progressive worsening noted  over last few weeks despite regular baclofen use 5mg  TID. Check labwork to r/o reversible causes (electrolytes, CPK, inflammatory markers).       Relevant Orders   Comprehensive metabolic panel (Completed)   Magnesium (Completed)   CK (Completed)   Sedimentation rate (Completed)   C-reactive protein (Completed)   Ferritin (Completed)   Chronic fatigue   Relevant Orders   Comprehensive metabolic panel (Completed)   CK (Completed)   Sedimentation rate (Completed)   C-reactive protein (Completed)   Ferritin (Completed)   Bilateral hearing loss   Chronic issue, L>R ear, associated with tinnitus, regularly wearing hearing aides since 2024.         No orders of the defined types were placed in this encounter.   Orders Placed This Encounter   Procedures   CT CARDIAC SCORING (SELF PAY ONLY)    Standing Status:   Future    Expiration Date:   03/08/2024    Preferred imaging location?:   [facility]    Is patient pregnant?:   No   Comprehensive metabolic panel   Magnesium   CK   Sedimentation rate   C-reactive protein   Ferritin    Patient Instructions  Stop ezetimibe. Labs today for cramping.  Continue working on low cholesterol diet.  I will order CT scan of heart for calcium score for further risk stratification.  Let me know if ongoing symptomatic palpitations and I will order heart monitor.   Follow up plan: Return if symptoms worsen or fail to improve.  Eustaquio Boyden, MD

## 2023-03-08 NOTE — Patient Instructions (Addendum)
 Stop ezetimibe. Labs today for cramping.  Continue working on low cholesterol diet.  I will order CT scan of heart for calcium score for further risk stratification.  Let me know if ongoing symptomatic palpitations and I will order heart monitor.

## 2023-03-09 DIAGNOSIS — H9193 Unspecified hearing loss, bilateral: Secondary | ICD-10-CM | POA: Insufficient documentation

## 2023-03-09 NOTE — Assessment & Plan Note (Addendum)
 Chronic issue, L>R ear, associated with tinnitus, regularly wearing hearing aides since 2024.

## 2023-03-09 NOTE — Assessment & Plan Note (Addendum)
 Discussed recent med difficulties - h/o once weekly atorvastatin intolerance (GI upset) now also notes zetia intolerance (similar GI upset, abdominal pain). Will stay off this.  We started cholesterol medication despite low ASCVD risk in setting of elevated cholesterol levels and documented abdominal aortic atherosclerosis on prior lumbar CT scan.  Will order coronary calcium score for further risk stratification and to guide cholesterol treatment decision (trial bempedoic acid, PCSK9i, lipid clinic referral, etc.

## 2023-03-09 NOTE — Assessment & Plan Note (Signed)
 Newly noted tachypalpitations to 130s with minimal exertion.  Continue good hydration status. Discussed option of cardiac event monitor if ongoing symptoms.

## 2023-03-09 NOTE — Assessment & Plan Note (Signed)
 Thyroid disease is followed by endocrinologist Dr Tedd Sias

## 2023-03-09 NOTE — Assessment & Plan Note (Addendum)
 Progressive worsening noted over last few weeks despite regular baclofen use 5mg  TID. Check labwork to r/o reversible causes (electrolytes, CPK, inflammatory markers).

## 2023-03-11 ENCOUNTER — Encounter: Payer: Self-pay | Admitting: Family Medicine

## 2023-03-14 ENCOUNTER — Encounter: Payer: Self-pay | Admitting: Family Medicine

## 2023-03-15 NOTE — Telephone Encounter (Addendum)
 Replied via lab result section yesterday.

## 2023-03-19 ENCOUNTER — Ambulatory Visit
Admission: RE | Admit: 2023-03-19 | Discharge: 2023-03-19 | Disposition: A | Discharge status: Home / Self Care | Payer: Self-pay | Source: Ambulatory Visit | Attending: Family Medicine | Admitting: Family Medicine

## 2023-03-19 DIAGNOSIS — E7800 Pure hypercholesterolemia, unspecified: Secondary | ICD-10-CM | POA: Insufficient documentation

## 2023-03-25 ENCOUNTER — Encounter: Payer: Self-pay | Admitting: Family Medicine

## 2023-04-11 ENCOUNTER — Other Ambulatory Visit: Payer: Self-pay | Admitting: Family Medicine

## 2023-04-11 DIAGNOSIS — F411 Generalized anxiety disorder: Secondary | ICD-10-CM

## 2023-04-11 NOTE — Telephone Encounter (Signed)
 Name of Medication:  Lorazepam Name of Pharmacy:  [facility] Last Fill or Written Date and Quantity:  02/15/23, #60 Last Office Visit and Type:  03/08/23, chol f/u Next Office Visit and Type:  04/11/23, discuss ongoing fatigue/weakness Last Controlled Substance Agreement Date:  06/10/13 Last UDS: 06/10/13, scanned

## 2023-04-12 ENCOUNTER — Ambulatory Visit: Admitting: Family Medicine

## 2023-04-12 ENCOUNTER — Other Ambulatory Visit: Payer: Self-pay | Admitting: Family Medicine

## 2023-04-12 ENCOUNTER — Encounter: Payer: Self-pay | Admitting: Family Medicine

## 2023-04-12 VITALS — BP 136/86 | HR 93 | Temp 98.5°F | Ht 67.0 in | Wt 196.1 lb

## 2023-04-12 DIAGNOSIS — E063 Autoimmune thyroiditis: Secondary | ICD-10-CM

## 2023-04-12 DIAGNOSIS — R202 Paresthesia of skin: Secondary | ICD-10-CM

## 2023-04-12 DIAGNOSIS — F411 Generalized anxiety disorder: Secondary | ICD-10-CM

## 2023-04-12 DIAGNOSIS — R5382 Chronic fatigue, unspecified: Secondary | ICD-10-CM | POA: Diagnosis not present

## 2023-04-12 DIAGNOSIS — E611 Iron deficiency: Secondary | ICD-10-CM | POA: Diagnosis not present

## 2023-04-12 DIAGNOSIS — R400 Somnolence: Secondary | ICD-10-CM

## 2023-04-12 LAB — IBC PANEL
Iron: 105 ug/dL (ref 42–145)
Saturation Ratios: 23.3 % (ref 20.0–50.0)
TIBC: 450.8 ug/dL — ABNORMAL HIGH (ref 250.0–450.0)
Transferrin: 322 mg/dL (ref 212.0–360.0)

## 2023-04-12 LAB — VITAMIN B12: Vitamin B-12: 508 pg/mL (ref 211–911)

## 2023-04-12 LAB — TSH: TSH: 29.75 u[IU]/mL — ABNORMAL HIGH (ref 0.35–5.50)

## 2023-04-12 NOTE — Patient Instructions (Signed)
 Labs today  Sleep questionnaire today - we will order home sleep study for further evaluation of possible sleep apnea.  We may also order iron infusion in [facility] off [address] in [place].

## 2023-04-12 NOTE — Progress Notes (Signed)
 Ph: [phone] Fax: [phone]   Patient ID: Karen Aguilar, female    DOB: 15-May-1965, 58 y.o.   MRN: [id number]  This visit was conducted in person.  BP 136/86   Pulse 93   Temp 98.5 F (36.9 C) (Oral)   Ht 5\' 7"  (1.702 m)   Wt 196 lb 2 oz (89 kg)   LMP 10/04/2014 Comment: per labs  SpO2 96%   BMI 30.72 kg/m    CC: fatigue  Subjective:   HPI: Karen Aguilar is a 58 y.o. female presenting on 04/12/2023 for Fatigue (C/o ongoing fatigue, weakness, chest pain and SOB. )   Notes worsening low energy fatigue, anorexia (no weight loss), insomnia (sleep maintenance), as well as shortness of breath with chest pain described as "someone sitting on my chest" tightness. Notes symptoms are worse at night time affecting sleep. Feels legs are heavier, hands tingling in the mornings.   Takes melatonin to help her sleep.  Some snoring.  Non restorative sleep.  + paroxysmal nocturnal dyspnea.  + morning headache - as well as throughout the day.  + daytime somnolence.  No witnessed apnea.   H/o mild OSA in her 30s. Intolerant to CPAP masks including nasal pillow due to worsening of migraines. Saw Dr Meredeth Stallion in [place].   Low normal iron stores - she has trouble tolerating oral iron, even at low dose. Overall good dietary iron intake. She may be interested in iron infusion. Last iron infusion was ~2015.   Recent coronary calcium score of zero, with normal radiology over-read of cardiac CT (03/2023).   Recent inflammatory makers mildly elevated - eye doctor eval not consistent with Temporal Arteritis. Levels normalized on repeat testing.   Chronic L facial numbness in h/o trigeminal neuralgia.  Breathing through mouth more than nose.   Labs from 10/2022 - ANA elevated with titer 1:160 (nuclear fine speckled pattern). C3/4 and anti-DNA Ab normal.  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  She gives blood quarterly to make her specialized eye drops for above conditions.      Relevant past medical, surgical, family and social history reviewed and updated as indicated. Interim medical history since our last visit reviewed. Allergies and medications reviewed and updated. Outpatient Medications Prior to Visit  Medication Sig Dispense Refill   Ascorbic Acid (VITAMIN C PO) Take 1 tablet by mouth as needed (for immune system support).      baclofen (LIORESAL) 10 MG tablet Take 0.5 tablets (5 mg total) by mouth 3 (three) times daily as needed.     calcium carbonate (TUMS EX) 750 MG chewable tablet Chew 2-3 tablets by mouth 2 (two) times daily as needed.     camphor-menthol (SARNA) lotion Apply 1 application topically at bedtime as needed for itching.      Carboxymethylcellulose Sodium (EYE DROPS OP) Apply to eye. PRGF eye drops. 1 drop 4-6 times a day in left eye     Cholecalciferol (VITAMIN D) 2000 units CAPS Take 1 capsule (2,000 Units total) by mouth daily. 30 capsule    diclofenac sodium (VOLTAREN) 1 % GEL APPLY THIN LAYER EXTERNALLY TO THE AFFECTED AREA UP TO FOUR TIMES DAILY AS NEEDED 300 g 0   docusate sodium (COLACE)  100 MG capsule Take 100 mg by mouth 2 (two) times daily. As needed     loteprednol (LOTEMAX) 0.5 % ophthalmic suspension Take 1 drop both eyes:  -4x/day for 1 week, then  -3x/day for 1 week, then  -2x/day for 1 week, then  -1x/day and stay at 1x/day until seen again     NONFORMULARY OR COMPOUNDED ITEM Hydroeye supplement - with fish oil, vitamin C, A, E, B6     NUCYNTA ER 250 MG TB12 SMARTSIG:1 Tablet(s) By Mouth Every 12 Hours     ondansetron (ZOFRAN-ODT) 4 MG disintegrating tablet Take 1 tablet (4 mg total) by mouth every 8 (eight) hours as needed for nausea or vomiting. 20 tablet 0   tacrolimus (PROTOPIC) 0.03 %  ointment Apply to eye. Custom compound     thyroid (ARMOUR) 90 MG tablet Take 1 tablet (90 mg total) by mouth daily. One day a week take 1/2 tablet     TYRVAYA 0.03 MG/ACT SOLN Place 1 spray into both nostrils 2 (two) times daily.     LORazepam (ATIVAN) 1 MG tablet Take 1 tablet (1 mg total) by mouth 2 (two) times daily as needed for anxiety. 60 tablet 0   Potassium 99 MG TABS Take 1 tablet (99 mg total) by mouth daily.  0   No facility-administered medications prior to visit.     Per HPI unless specifically indicated in ROS section below Review of Systems  Objective:  BP 136/86   Pulse 93   Temp 98.5 F (36.9 C) (Oral)   Ht 5\' 7"  (1.702 m)   Wt 196 lb 2 oz (89 kg)   LMP 10/04/2014 Comment: per labs  SpO2 96%   BMI 30.72 kg/m   Wt Readings from Last 3 Encounters:  04/12/23 196 lb 2 oz (89 kg)  03/08/23 194 lb (88 kg)  02/11/23 193 lb (87.5 kg)      Physical Exam Vitals and nursing note reviewed.  Constitutional:      Appearance: Normal appearance. She is not ill-appearing.  HENT:     Head: Normocephalic and atraumatic.     Mouth/Throat:     Mouth: Mucous membranes are moist.     Pharynx: Oropharynx is clear. No oropharyngeal exudate or posterior oropharyngeal erythema.  Eyes:     General:        Right eye: No discharge.        Left eye: No discharge.     Extraocular Movements: Extraocular movements intact.     Conjunctiva/sclera: Conjunctivae normal.     Pupils: Pupils are equal, round, and reactive to light.  Neck:     Thyroid: No thyroid mass or thyromegaly.  Cardiovascular:     Rate and Rhythm: Normal rate and regular rhythm.     Pulses: Normal pulses.     Heart sounds: Normal heart sounds. No murmur heard. Pulmonary:     Effort: Pulmonary effort is normal. No respiratory distress.     Breath sounds: Normal breath sounds. No wheezing, rhonchi or rales.  Musculoskeletal:     Cervical back: Normal range of motion and neck supple.     Right lower leg: No edema.      Left lower leg: No edema.  Skin:    General: Skin is warm and dry.     Findings: No rash.  Neurological:     Mental Status: She is alert.  Psychiatric:        Mood and Affect: Mood normal.  Behavior: Behavior normal.       Results for orders placed or performed in visit on 04/12/23  Vitamin B12   Collection Time: 04/12/23  8:59 AM  Result Value Ref Range   Vitamin B-12 508 211 - 911 pg/mL  TSH   Collection Time: 04/12/23  8:59 AM  Result Value Ref Range   TSH 29.75 (H) 0.35 - 5.50 uIU/mL  IBC panel   Collection Time: 04/12/23  8:59 AM  Result Value Ref Range   Iron 105 42 - 145 ug/dL   Transferrin 829.5 621.3 - 360.0 mg/dL   Saturation Ratios 08.6 20.0 - 50.0 %   TIBC 450.8 (H) 250.0 - 450.0 mcg/dL   Lab Results  Component Value Date   FERRITIN 21.9 03/08/2023   Assessment & Plan:   Problem List Items Addressed This Visit     Hypothyroidism due to Hashimoto's thyroiditis   This is followed by endo. Update TSH with endorsed worsening fatigue.       Iron deficiency   Update levels - she is intolerant of oral iron  Consider iron infusion pending results - she would be willing to go to [facility] for this.       Relevant Orders   IBC panel (Completed)   Chronic fatigue - Primary   Ongoing fatigue associated with loss of appetite, trouble sleeping, occasional dyspnea and chest discomfort with recent calcium score of zero.  Update labwork to further evaluate for reversible causes of fatigue including iron panel - h/o iron def s/p remote iron infusions       Relevant Orders   Vitamin B12 (Completed)   TSH (Completed)   Daytime somnolence   Endorses snoring, non-restorative sleep, paroxysmal nocturnal dyspnea, daytime somnolence and morning headaches.  ESS = 3 - negative screen for OSA however given endorsed symptoms do recommend further evaluation for sleep apnea with HST will order through [facility].  This could contribute to ongoing fatigue.  She notes  intolerance to all CPAP masks previously tried (worsened migraines) - discussed oral appliance vs evaluation for Inspire device if significant sleep apnea present. Anticipate would do poorly with CPAP mask due to known trigeminal neuralgia.       Other Visit Diagnoses       Hand paresthesia       Relevant Orders   Vitamin B1        No orders of the defined types were placed in this encounter.   Orders Placed This Encounter  Procedures   Vitamin B12   TSH   IBC panel   Vitamin B1    Patient Instructions  Labs today  Sleep questionnaire today - we will order home sleep study for further evaluation of possible sleep apnea.  We may also order iron infusion in [facility] off [address] in [place].   Follow up plan: Return if symptoms worsen or fail to improve.  Claire Crick, MD

## 2023-04-12 NOTE — Telephone Encounter (Signed)
 ERx

## 2023-04-12 NOTE — Telephone Encounter (Signed)
 Copied from CRM [id number]. Topic: Clinical - Medication Refill >> Apr 12, 2023  4:27 PM Ann Held wrote: Most Recent Primary Care Visit:  Provider: Eustaquio Boyden  Department: [facility]  Visit Type: OFFICE VISIT  Date: 03/08/2023  Medication: LORazepam (ATIVAN) 1 MG tablet  Has the patient contacted their pharmacy? Yes (Agent: If no, request that the patient contact the pharmacy for the refill. If patient does not wish to contact the pharmacy document the reason why and proceed with request.) (Agent: If yes, when and what did the pharmacy advise?)  Is this the correct pharmacy for this prescription? Yes If no, delete pharmacy and type the correct one.  This is the patient's preferred pharmacy:  [facility] - [place], [place] - [address] [place] [address] Phone: [phone] Fax: [phone]   Has the prescription been filled recently? No  Is the patient out of the medication? Yes  Has the patient been seen for an appointment in the last year OR does the patient have an upcoming appointment? Yes  Can we respond through MyChart? Yes  Agent: Please be advised that Rx refills may take up to 3 business days. We ask that you follow-up with your pharmacy.

## 2023-04-12 NOTE — Telephone Encounter (Signed)
 Duplicate request (see 04/11/23 refill note).   Rx unpinned.

## 2023-04-13 ENCOUNTER — Encounter: Payer: Self-pay | Admitting: Family Medicine

## 2023-04-13 DIAGNOSIS — R400 Somnolence: Secondary | ICD-10-CM | POA: Insufficient documentation

## 2023-04-13 NOTE — Assessment & Plan Note (Addendum)
 Endorses snoring, non-restorative sleep, paroxysmal nocturnal dyspnea, daytime somnolence and morning headaches.  ESS = 3 - negative screen for OSA however given endorsed symptoms do recommend further evaluation for sleep apnea with HST will order through [facility].  This could contribute to ongoing fatigue.  She notes intolerance to all CPAP masks previously tried (worsened migraines) - discussed oral appliance vs evaluation for Inspire device if significant sleep apnea present. Anticipate would do poorly with CPAP mask due to known trigeminal neuralgia.

## 2023-04-13 NOTE — Assessment & Plan Note (Signed)
 Update levels - she is intolerant of oral iron  Consider iron infusion pending results - she would be willing to go to [facility] for this.

## 2023-04-13 NOTE — Assessment & Plan Note (Signed)
 Ongoing fatigue associated with loss of appetite, trouble sleeping, occasional dyspnea and chest discomfort with recent calcium score of zero.  Update labwork to further evaluate for reversible causes of fatigue including iron panel - h/o iron def s/p remote iron infusions

## 2023-04-13 NOTE — Assessment & Plan Note (Signed)
 This is followed by endo. Update TSH with endorsed worsening fatigue.

## 2023-04-16 LAB — VITAMIN B1: Vitamin B1 (Thiamine): 12 nmol/L (ref 8–30)

## 2023-05-17 ENCOUNTER — Telehealth: Admitting: Nurse Practitioner

## 2023-05-17 DIAGNOSIS — J40 Bronchitis, not specified as acute or chronic: Secondary | ICD-10-CM | POA: Diagnosis not present

## 2023-05-17 MED ORDER — PROMETHAZINE-DM 6.25-15 MG/5ML PO SYRP: 5.0000 mL | ORAL_SOLUTION | Freq: Four times a day (QID) | ORAL | 0 refills | Status: DC | PRN

## 2023-05-17 MED ORDER — ALBUTEROL SULFATE HFA 108 (90 BASE) MCG/ACT IN AERS: 2.0000 | INHALATION_SPRAY | Freq: Four times a day (QID) | RESPIRATORY_TRACT | 0 refills | Status: DC | PRN

## 2023-05-17 MED ORDER — DOXYCYCLINE HYCLATE 100 MG PO TABS
100.0000 mg | ORAL_TABLET | Freq: Two times a day (BID) | ORAL | 0 refills | Status: DC
Start: 2023-05-17 — End: 2023-05-21

## 2023-05-17 NOTE — Progress Notes (Addendum)
 Virtual Visit Consent   Karen Aguilar, you are scheduled for a virtual visit with a [facility] provider today. Just as with appointments in the office, your consent must be obtained to participate. Your consent will be active for this visit and any virtual visit you may have with one of our providers in the next 365 days. If you have a MyChart account, a copy of this consent can be sent to you electronically.  As this is a virtual visit, video technology does not allow for your provider to perform a traditional examination. This may limit your provider's ability to fully assess your condition. If your provider identifies any concerns that need to be evaluated in person or the need to arrange testing (such as labs, EKG, etc.), we will make arrangements to do so. Although advances in technology are sophisticated, we cannot ensure that it will always work on either your end or our end. If the connection with a video visit is poor, the visit may have to be switched to a telephone visit. With either a video or telephone visit, we are not always able to ensure that we have a secure connection.  By engaging in this virtual visit, you consent to the provision of healthcare and authorize for your insurance to be billed (if applicable) for the services provided during this visit. Depending on your insurance coverage, you may receive a charge related to this service.  I need to obtain your verbal consent now. Are you willing to proceed with your visit today? LEI DOWER has provided verbal consent on 05/17/2023 for a virtual visit (video or telephone). Mardene Shake, FNP  Date: 05/17/2023 2:29 PM   Virtual Visit via Video Note   I, Mardene Shake, connected with  Karen Aguilar  ([id number], 20-Feb-1965) on 05/17/23 at  2:30 PM EDT by a video-enabled telemedicine application and verified that I am speaking with the correct person using two identifiers.  Location: Patient: Virtual Visit Location Patient:  Home Provider: Virtual Visit Location Provider: Home Office   I discussed the limitations of evaluation and management by telemedicine and the availability of in person appointments. The patient expressed understanding and agreed to proceed.    History of Present Illness: Karen Aguilar is a 58 y.o. who identifies as a female who was assigned female at birth, and is being seen today for cough and fever.  She was initially experiencing upper respiratory symptoms that have progressed into her lungs in the past 48 hours. She started to have a fever last night  Sinus congestion/runny nose is resolved  Cough is dry   She has had bronchitis frequently in the past and has needed an inhaler for that  She feels that she does hear a "crackle" when she lays down.  She has been using Nyquil and Dayquil for symptom relief   Similar episode 02/2022 tolerated doxycycline  at that time   Problems:  Patient Active Problem List   Diagnosis Date Noted   Daytime somnolence 04/13/2023   Bilateral hearing loss 03/09/2023   Muscle cramping 03/08/2023   Chronic fatigue 03/08/2023   Stress due to illness of family member 02/12/2023   Aortic atherosclerosis (HCC) 10/10/2022   Polyarthralgia 07/09/2022   Vision changes 07/09/2022   Osteopenia 07/06/2022   Digital mucinous cyst of toe of left foot 06/01/2022   TMJ tenderness, left 01/11/2022   Chest tightness 10/04/2021   Eustachian tube dysfunction, bilateral 10/04/2021   Laryngitis 10/04/2021   Low serum cortisol level  03/15/2021   Palpitations 02/23/2021   Severe sepsis (HCC) 02/19/2021   Acute gastroenteropathy due to Norovirus 02/19/2021   Earlobe lesion, left 01/25/2021   Radiculitis of right cervical region 01/24/2021   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   Medicare annual wellness visit, subsequent 06/02/2020   Trigeminal neuralgia 06/02/2020   Immunization deficiency 06/02/2020   Recurrent oral ulcers 02/22/2020   Chronically dry  eyes, bilateral 02/10/2020   Lymphadenopathy 02/10/2020   Left facial numbness 08/27/2019   Hepatic hemangioma 09/01/2018   Lower abdominal pain 04/14/2018   Mass of chest wall, left 05/09/2017   Abdominal discomfort 02/28/2017   Non-intractable vomiting with nausea 02/27/2017   Right leg pain 12/06/2016   DDD (degenerative disc disease), cervical 06/09/2016   GAD (generalized anxiety disorder) 02/18/2016   Pelvic pressure in female 01/23/2016   Post-menopausal 10/10/2015   Urticaria 09/08/2015   Neurogenic bladder    Mixed urge and stress incontinence    Advanced care planning/counseling discussion 07/15/2014   Health maintenance examination 07/15/2014   Irritable bowel syndrome 02/23/2014   Dysphagia 06/05/2013   Chronic diarrhea 05/12/2013   Chronic pelvic pain in female 05/05/2013   Gastroparesis 05/04/2013   Solitary pulmonary nodule 03/18/2013   Constipation 02/10/2013   Internal hemorrhoids 02/10/2013   Hypothyroidism due to Hashimoto's thyroiditis    Fibromyalgia    GERD (gastroesophageal reflux disease)    Chronic pain syndrome    Sleep apnea    MDD (major depressive disorder), recurrent episode, moderate (HCC)    Arachnoiditis    Hyperlipidemia    Complicated migraine    Vitamin D  deficiency    Iron deficiency    DDD (degenerative disc disease), lumbar     Allergies:  Allergies  Allergen Reactions   Augmentin  [Amoxicillin -Pot Clavulanate] Swelling    Lip swelling and irritation.    Fluconazole  Swelling    Lips - swelling, bleeding, blisters, cracking, peeling   Erythromycin Nausea And Vomiting   Influenza Vaccines Hives and Swelling    Redness, heat, and extreme swelling at site   Levothyroxine  Hives   Thimerosal (Thiomersal) Swelling    Local reaction-redness,swelling; veins popping   Tomato Other (See Comments)    Severe acid reflux with all acid foods   Atogepant Other (See Comments)    Gi symptoms   Atorvastatin  Other (See Comments)    GI upset,  even once weekly dosing   Adhesive [Tape] Rash    blisters   Azithromycin Rash   Gabapentin Other (See Comments)    Short term memory loss   Imitrex [Sumatriptan] Other (See Comments)    Rebound migraine    Lyrica [Pregabalin] Other (See Comments)    Short term memory loss   Macrodantin  [Nitrofurantoin ] Rash   Macrolides And Ketolides Rash   Nickel Swelling and Rash   Sulfa Antibiotics Rash   Sulfasalazine Rash   Troleandomycin Rash   Vancomycin  Itching and Rash    Developed Redman's, may require premedication and infusion rate reduction in the future   Medications:  Current Outpatient Medications:    Ascorbic Acid  (VITAMIN C  PO), Take 1 tablet by mouth as needed (for immune system support). , Disp: , Rfl:    baclofen  (LIORESAL ) 10 MG tablet, Take 0.5 tablets (5 mg total) by mouth 3 (three) times daily as needed., Disp: , Rfl:    calcium  carbonate (TUMS EX) 750 MG chewable tablet, Chew 2-3 tablets by mouth 2 (two) times daily as needed., Disp: , Rfl:    camphor-menthol  (SARNA) lotion,  Apply 1 application topically at bedtime as needed for itching. , Disp: , Rfl:    Carboxymethylcellulose Sodium (EYE DROPS OP), Apply to eye. PRGF eye drops. 1 drop 4-6 times a day in left eye, Disp: , Rfl:    Cholecalciferol  (VITAMIN D ) 2000 units CAPS, Take 1 capsule (2,000 Units total) by mouth daily., Disp: 30 capsule, Rfl:    diclofenac  sodium (VOLTAREN ) 1 % GEL, APPLY THIN LAYER EXTERNALLY TO THE AFFECTED AREA UP TO FOUR TIMES DAILY AS NEEDED, Disp: 300 g, Rfl: 0   docusate sodium  (COLACE) 100 MG capsule, Take 100 mg by mouth 2 (two) times daily. As needed, Disp: , Rfl:    LORazepam  (ATIVAN ) 1 MG tablet, TAKE 1 TABLET BY MOUTH TWICE DAILY AS NEEDED FOR ANXIETY, Disp: 60 tablet, Rfl: 0   loteprednol  (LOTEMAX ) 0.5 % ophthalmic suspension, Take 1 drop both eyes:  -4x/day for 1 week, then  -3x/day for 1 week, then  -2x/day for 1 week, then  -1x/day and stay at 1x/day until seen again, Disp: , Rfl:     NONFORMULARY OR COMPOUNDED ITEM, Hydroeye supplement - with fish oil, vitamin C , A, E, B6, Disp: , Rfl:    NUCYNTA  ER 250 MG TB12, SMARTSIG:1 Tablet(s) By Mouth Every 12 Hours, Disp: , Rfl:    ondansetron  (ZOFRAN -ODT) 4 MG disintegrating tablet, Take 1 tablet (4 mg total) by mouth every 8 (eight) hours as needed for nausea or vomiting., Disp: 20 tablet, Rfl: 0   tacrolimus  (PROTOPIC ) 0.03 % ointment, Apply to eye. Custom compound, Disp: , Rfl:    thyroid  (ARMOUR) 90 MG tablet, Take 1 tablet (90 mg total) by mouth daily. One day a week take 1/2 tablet, Disp: , Rfl:    TYRVAYA  0.03 MG/ACT SOLN, Place 1 spray into both nostrils 2 (two) times daily., Disp: , Rfl:   Observations/Objective: Patient is well-developed, well-nourished in no acute distress.  Resting comfortably  at home.  Head is normocephalic, atraumatic.  No labored breathing.  Speech is clear and coherent with logical content.  Patient is alert and oriented at baseline.    Assessment and Plan:  1. Bronchitis    Meds ordered this encounter  Medications   doxycycline  (VIBRA -TABS) 100 MG tablet    Sig: Take 1 tablet (100 mg total) by mouth 2 (two) times daily for 7 days.    Dispense:  14 tablet    Refill:  0   albuterol  (VENTOLIN  HFA) 108 (90 Base) MCG/ACT inhaler    Sig: Inhale 2 puffs into the lungs every 6 (six) hours as needed for wheezing or shortness of breath.    Dispense:  8 g    Refill:  0   promethazine -dextromethorphan  (PROMETHAZINE -DM) 6.25-15 MG/5ML syrup    Sig: Take 5 mLs by mouth 4 (four) times daily as needed for cough.    Dispense:  118 mL    Refill:  0     Follow Up Instructions: I discussed the assessment and treatment plan with the patient. The patient was provided an opportunity to ask questions and all were answered. The patient agreed with the plan and demonstrated an understanding of the instructions.  A copy of instructions were sent to the patient via MyChart unless otherwise noted below.     The patient was advised to call back or seek an in-person evaluation if the symptoms worsen or if the condition fails to improve as anticipated.    Mardene Shake, FNP

## 2023-05-19 ENCOUNTER — Inpatient Hospital Stay
Admission: EM | Admit: 2023-05-19 | Discharge: 2023-05-21 | DRG: 871 | Disposition: A | Discharge status: Home / Self Care | Attending: Family Medicine | Admitting: Family Medicine

## 2023-05-19 ENCOUNTER — Encounter: Payer: Self-pay | Admitting: Intensive Care

## 2023-05-19 ENCOUNTER — Emergency Department

## 2023-05-19 ENCOUNTER — Other Ambulatory Visit: Payer: Self-pay

## 2023-05-19 DIAGNOSIS — A419 Sepsis, unspecified organism: Secondary | ICD-10-CM | POA: Diagnosis not present

## 2023-05-19 DIAGNOSIS — Z8249 Family history of ischemic heart disease and other diseases of the circulatory system: Secondary | ICD-10-CM

## 2023-05-19 DIAGNOSIS — G894 Chronic pain syndrome: Secondary | ICD-10-CM | POA: Diagnosis present

## 2023-05-19 DIAGNOSIS — K219 Gastro-esophageal reflux disease without esophagitis: Secondary | ICD-10-CM | POA: Diagnosis present

## 2023-05-19 DIAGNOSIS — Z881 Allergy status to other antibiotic agents status: Secondary | ICD-10-CM | POA: Diagnosis not present

## 2023-05-19 DIAGNOSIS — F32A Depression, unspecified: Secondary | ICD-10-CM | POA: Diagnosis present

## 2023-05-19 DIAGNOSIS — M797 Fibromyalgia: Secondary | ICD-10-CM | POA: Diagnosis present

## 2023-05-19 DIAGNOSIS — N319 Neuromuscular dysfunction of bladder, unspecified: Secondary | ICD-10-CM | POA: Diagnosis present

## 2023-05-19 DIAGNOSIS — Z8661 Personal history of infections of the central nervous system: Secondary | ICD-10-CM | POA: Diagnosis not present

## 2023-05-19 DIAGNOSIS — J189 Pneumonia, unspecified organism: Secondary | ICD-10-CM | POA: Diagnosis not present

## 2023-05-19 DIAGNOSIS — E039 Hypothyroidism, unspecified: Secondary | ICD-10-CM | POA: Diagnosis not present

## 2023-05-19 DIAGNOSIS — E785 Hyperlipidemia, unspecified: Secondary | ICD-10-CM | POA: Diagnosis present

## 2023-05-19 DIAGNOSIS — Z8711 Personal history of peptic ulcer disease: Secondary | ICD-10-CM

## 2023-05-19 DIAGNOSIS — Z882 Allergy status to sulfonamides status: Secondary | ICD-10-CM

## 2023-05-19 DIAGNOSIS — Z981 Arthrodesis status: Secondary | ICD-10-CM | POA: Diagnosis not present

## 2023-05-19 DIAGNOSIS — Z833 Family history of diabetes mellitus: Secondary | ICD-10-CM

## 2023-05-19 DIAGNOSIS — Z9049 Acquired absence of other specified parts of digestive tract: Secondary | ICD-10-CM | POA: Diagnosis not present

## 2023-05-19 DIAGNOSIS — Z87442 Personal history of urinary calculi: Secondary | ICD-10-CM | POA: Diagnosis not present

## 2023-05-19 DIAGNOSIS — Z91018 Allergy to other foods: Secondary | ICD-10-CM

## 2023-05-19 DIAGNOSIS — Z1152 Encounter for screening for COVID-19: Secondary | ICD-10-CM | POA: Diagnosis not present

## 2023-05-19 DIAGNOSIS — Z792 Long term (current) use of antibiotics: Secondary | ICD-10-CM

## 2023-05-19 DIAGNOSIS — Z91048 Other nonmedicinal substance allergy status: Secondary | ICD-10-CM

## 2023-05-19 DIAGNOSIS — Z8616 Personal history of COVID-19: Secondary | ICD-10-CM | POA: Diagnosis not present

## 2023-05-19 DIAGNOSIS — R5382 Chronic fatigue, unspecified: Secondary | ICD-10-CM | POA: Diagnosis present

## 2023-05-19 DIAGNOSIS — Z887 Allergy status to serum and vaccine status: Secondary | ICD-10-CM | POA: Diagnosis not present

## 2023-05-19 DIAGNOSIS — Z880 Allergy status to penicillin: Secondary | ICD-10-CM | POA: Diagnosis not present

## 2023-05-19 DIAGNOSIS — F419 Anxiety disorder, unspecified: Secondary | ICD-10-CM | POA: Diagnosis not present

## 2023-05-19 DIAGNOSIS — K589 Irritable bowel syndrome without diarrhea: Secondary | ICD-10-CM | POA: Diagnosis present

## 2023-05-19 HISTORY — DX: Sepsis, unspecified organism: A41.9

## 2023-05-19 LAB — CBC
HCT: 39.1 % (ref 36.0–46.0)
Hemoglobin: 13.2 g/dL (ref 12.0–15.0)
MCH: 30.3 pg (ref 26.0–34.0)
MCHC: 33.8 g/dL (ref 30.0–36.0)
MCV: 89.7 fL (ref 80.0–100.0)
Platelets: 192 10*3/uL (ref 150–400)
RBC: 4.36 MIL/uL (ref 3.87–5.11)
RDW: 12.3 % (ref 11.5–15.5)
WBC: 3.9 10*3/uL — ABNORMAL LOW (ref 4.0–10.5)
nRBC: 0 % (ref 0.0–0.2)

## 2023-05-19 LAB — BASIC METABOLIC PANEL WITH GFR
Anion gap: 10 (ref 5–15)
BUN: 9 mg/dL (ref 6–20)
CO2: 27 mmol/L (ref 22–32)
Calcium: 8.7 mg/dL — ABNORMAL LOW (ref 8.9–10.3)
Chloride: 96 mmol/L — ABNORMAL LOW (ref 98–111)
Creatinine, Ser: 0.84 mg/dL (ref 0.44–1.00)
GFR, Estimated: >60 mL/min (ref >60)
Glucose, Bld: 126 mg/dL — ABNORMAL HIGH (ref 70–99)
Potassium: 3.5 mmol/L (ref 3.5–5.1)
Sodium: 133 mmol/L — ABNORMAL LOW (ref 135–145)

## 2023-05-19 LAB — RESP PANEL BY RT-PCR (RSV, FLU A&B, COVID)  RVPGX2
Influenza A by PCR: NEGATIVE
Influenza B by PCR: NEGATIVE
Resp Syncytial Virus by PCR: NEGATIVE
SARS Coronavirus 2 by RT PCR: NEGATIVE

## 2023-05-19 LAB — LACTIC ACID, PLASMA: Lactic Acid, Venous: 1 mmol/L (ref 0.5–1.9)

## 2023-05-19 MED ORDER — DICLOFENAC SODIUM 1 % EX GEL: 2.0000 g | Freq: Four times a day (QID) | CUTANEOUS | Status: DC | PRN

## 2023-05-19 MED ORDER — LEVOFLOXACIN IN D5W 750 MG/150ML IV SOLN
750.0000 mg | INTRAVENOUS | Status: DC
  Administered 2023-05-20: 750 mg via INTRAVENOUS
  Filled 2023-05-19 (×2): qty 150

## 2023-05-19 MED ORDER — LEVOFLOXACIN IN D5W 750 MG/150ML IV SOLN
750.0000 mg | Freq: Once | INTRAVENOUS | Status: AC
  Administered 2023-05-19: 750 mg via INTRAVENOUS
  Filled 2023-05-19: qty 150

## 2023-05-19 MED ORDER — THYROID 60 MG PO TABS
90.0000 mg | ORAL_TABLET | Freq: Every day | ORAL | Status: DC
  Administered 2023-05-20 – 2023-05-21 (×2): 90 mg via ORAL
  Filled 2023-05-19 (×3): qty 1

## 2023-05-19 MED ORDER — MAGNESIUM HYDROXIDE 400 MG/5ML PO SUSP: 30.0000 mL | Freq: Every day | ORAL | Status: DC | PRN

## 2023-05-19 MED ORDER — ACETAMINOPHEN 650 MG RE SUPP: 650.0000 mg | Freq: Four times a day (QID) | RECTAL | Status: DC | PRN

## 2023-05-19 MED ORDER — CAMPHOR-MENTHOL 0.5-0.5 % EX LOTN: 1.0000 | TOPICAL_LOTION | Freq: Every evening | CUTANEOUS | Status: DC | PRN

## 2023-05-19 MED ORDER — VARENICLINE TARTRATE 0.03 MG/ACT NA SOLN
1.0000 | Freq: Two times a day (BID) | NASAL | Status: DC
  Administered 2023-05-20 – 2023-05-21 (×2): 1 via NASAL
  Filled 2023-05-19: qty 4.2

## 2023-05-19 MED ORDER — DOCUSATE SODIUM 100 MG PO CAPS: 100.0000 mg | ORAL_CAPSULE | Freq: Two times a day (BID) | ORAL | Status: DC | PRN

## 2023-05-19 MED ORDER — VITAMIN C 500 MG PO TABS: 250.0000 mg | ORAL_TABLET | ORAL | Status: DC | PRN

## 2023-05-19 MED ORDER — LORAZEPAM 1 MG PO TABS
1.0000 mg | ORAL_TABLET | Freq: Two times a day (BID) | ORAL | Status: DC | PRN
  Administered 2023-05-20: 1 mg via ORAL
  Filled 2023-05-19: qty 1

## 2023-05-19 MED ORDER — LACTATED RINGERS IV SOLN
150.0000 mL/h | INTRAVENOUS | Status: DC
  Administered 2023-05-19 – 2023-05-20 (×2): 150 mL/h via INTRAVENOUS

## 2023-05-19 MED ORDER — ONDANSETRON HCL 4 MG/2ML IJ SOLN: 4.0000 mg | Freq: Four times a day (QID) | INTRAMUSCULAR | Status: DC | PRN

## 2023-05-19 MED ORDER — SODIUM CHLORIDE 0.9 % IV BOLUS
1000.0000 mL | Freq: Once | INTRAVENOUS | Status: AC
  Administered 2023-05-19: 1000 mL via INTRAVENOUS

## 2023-05-19 MED ORDER — ALBUTEROL SULFATE (2.5 MG/3ML) 0.083% IN NEBU: 2.5000 mg | INHALATION_SOLUTION | Freq: Four times a day (QID) | RESPIRATORY_TRACT | Status: DC | PRN

## 2023-05-19 MED ORDER — ACETAMINOPHEN 500 MG PO TABS
1000.0000 mg | ORAL_TABLET | ORAL | Status: AC
  Administered 2023-05-19: 1000 mg via ORAL
  Filled 2023-05-19: qty 2

## 2023-05-19 MED ORDER — ONDANSETRON HCL 4 MG PO TABS
4.0000 mg | ORAL_TABLET | Freq: Four times a day (QID) | ORAL | Status: DC | PRN
  Administered 2023-05-20: 4 mg via ORAL
  Filled 2023-05-19: qty 1

## 2023-05-19 MED ORDER — TRAZODONE HCL 50 MG PO TABS: 25.0000 mg | ORAL_TABLET | Freq: Every evening | ORAL | Status: DC | PRN

## 2023-05-19 MED ORDER — LOTEPREDNOL ETABONATE 0.5 % OP SUSP: 1.0000 [drp] | Freq: Four times a day (QID) | OPHTHALMIC | Status: DC

## 2023-05-19 MED ORDER — TACROLIMUS 0.03 % EX OINT
TOPICAL_OINTMENT | Freq: Two times a day (BID) | CUTANEOUS | Status: DC
Start: 2023-05-19 — End: 2023-05-20

## 2023-05-19 MED ORDER — CALCIUM CARBONATE ANTACID 500 MG PO CHEW: 2.0000 | CHEWABLE_TABLET | Freq: Two times a day (BID) | ORAL | Status: DC | PRN

## 2023-05-19 MED ORDER — ACETAMINOPHEN 325 MG PO TABS: 650.0000 mg | ORAL_TABLET | Freq: Four times a day (QID) | ORAL | Status: DC | PRN

## 2023-05-19 MED ORDER — HYDROCOD POLI-CHLORPHE POLI ER 10-8 MG/5ML PO SUER
5.0000 mL | Freq: Once | ORAL | Status: AC
  Administered 2023-05-19: 5 mL via ORAL
  Filled 2023-05-19: qty 5

## 2023-05-19 MED ORDER — ONDANSETRON HCL 4 MG/2ML IJ SOLN
4.0000 mg | INTRAMUSCULAR | Status: AC
  Administered 2023-05-19: 4 mg via INTRAVENOUS
  Filled 2023-05-19: qty 2

## 2023-05-19 MED ORDER — VITAMIN D 25 MCG (1000 UNIT) PO TABS
2000.0000 [IU] | ORAL_TABLET | Freq: Every day | ORAL | Status: DC
  Administered 2023-05-20 – 2023-05-21 (×2): 2000 [IU] via ORAL
  Filled 2023-05-19 (×2): qty 2

## 2023-05-19 MED ORDER — ENOXAPARIN SODIUM 40 MG/0.4ML IJ SOSY
40.0000 mg | PREFILLED_SYRINGE | INTRAMUSCULAR | Status: DC
  Administered 2023-05-20: 40 mg via SUBCUTANEOUS
  Filled 2023-05-19: qty 0.4

## 2023-05-19 MED ORDER — BACLOFEN 10 MG PO TABS
5.0000 mg | ORAL_TABLET | Freq: Three times a day (TID) | ORAL | Status: DC | PRN
  Administered 2023-05-20 – 2023-05-21 (×3): 5 mg via ORAL
  Filled 2023-05-19 (×3): qty 1

## 2023-05-19 MED ORDER — LEVOFLOXACIN IN D5W 750 MG/150ML IV SOLN: 750.0000 mg | Freq: Once | INTRAVENOUS | Status: DC

## 2023-05-19 NOTE — ED Provider Notes (Signed)
 [facility] Provider Note    Event Date/Time   First MD Initiated Contact with Patient 05/19/23 1502     (approximate)   History   Shortness of Breath and Cough   HPI  Karen Aguilar is a 58 y.o. female taking doxycycline  for bronchitis for the last 2 days.  No chest pain reports cough fevers chills body aches increasing feeling of shortness of breath.  Symptoms started started with a "cold" about a week ago but then turned more into a cough with fever.  Mild shortness of breath.  No chest pain.  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 Marcelino Sera Pain 09/2018: COVID-19 virus infection No date: DDD (degenerative disc disease), cervical No date: DDD (degenerative disc disease), lumbar     Comment:  s/p permanent nerve damage after back surgery No date: Depression No date: Depression with anxiety No date: Diverticulosis No date: Eczema No date: Family history of adverse reaction to anesthesia     Comment:  Mother - nausea No date: Fibromyalgia No date: Foot drop     Comment:  right - numbness, tingling - twisting No date: Gallstones No date: GERD (gastroesophageal reflux disease)     Comment:  rare zantac  PRN N/V at times with this No date: Headache(784.0)     Comment:  migraines No date: Hemorrhoid No date: Hemorrhoids No date: History of kidney stones No date: Hyperlipidemia 1990s:  Hypothyroidism     Comment:  hashimoto's thyroiditis No date: IBS (irritable bowel syndrome) No date: IBS (irritable bowel syndrome) No date: Incontinence of urine     Comment:  s/p sling procedure-unresolved No date: Iron deficiency     Comment:  h/o anemia, s/p iron infusion (2012) No date: Liver hemangioma     Comment:   x2 No date: Memory change     Comment:  due to medications No date: Migraines     Comment:  and h/o optical migraine No date: Migraines 2008: Mixed urge and stress incontinence     Comment:  extensive workup including urodynamics, failed miltiple               anticholinergics including myrbetriq and urogesic blue               s/p PTNS, normal cystoscopy 2014, seen by Dr Clarke Crouch               and Dr Ace Holder 2008: Neurogenic bladder     Comment:  after 13 spine surgeries with periph neuropathy and               chronic LBP 2011: Neuropathy of lower extremity No date: Nocturia No date: Notalgia paresthetica 12/2013: Osteoarthritis No date: PONV (postoperative nausea and vomiting) 04/2012: Pulmonary  nodule     Comment:  7.36mm RLL nodule - resolved on imaging 12/2012 No date: Sleep apnea     Comment:  mild-doesn't use cpap No date: Urine frequency No date: Vitamin D  deficiency   Physical Exam   Triage Vital Signs: ED Triage Vitals  Encounter Vitals Group     BP 05/19/23 1419 108/87     Systolic BP Percentile --      Diastolic BP Percentile --      Pulse Rate 05/19/23 1419 (!) 109     Resp 05/19/23 1419 (!) 21     Temp 05/19/23 1419 98.6 F (37 C)     Temp Source 05/19/23 1419 Oral     SpO2 05/19/23 1419 98 %     Weight 05/19/23 1420 197 lb (89.4 kg)     Height 05/19/23 1420 5\' 7"  (1.702 m)     Head Circumference --      Peak Flow --      Pain Score 05/19/23 1420 6     Pain Loc --      Pain Education --      Exclude from Growth Chart --     Most recent vital signs: Vitals:   05/19/23 1815 05/19/23 2010  BP: 118/85   Pulse: (!) 112    Resp: 19   Temp:  (!) 100.8 F (38.2 C)  SpO2: 92%      General: Awake, no distress.  Very pleasant.  Just slightly tachypneic but in no distress CV:  Good peripheral perfusion.  Normal tone slight tachycardia Resp:  Normal effort with exception to slight tachypnea.  Lung sounds noted be clear throughout Abd:  No distention.  Soft nontender Other:  No lower extremity edema   ED Results / Procedures / Treatments   Labs (all labs ordered are listed, but only abnormal results are displayed) Labs Reviewed  BASIC METABOLIC PANEL WITH GFR - Abnormal; Notable for the following components:      Result Value   Sodium 133 (*)    Chloride 96 (*)    Glucose, Bld 126 (*)    Calcium  8.7 (*)    All other components within normal limits  CBC - Abnormal; Notable for the following components:   WBC 3.9 (*)    All other components within normal limits  RESP PANEL BY RT-PCR (RSV, FLU A&B, COVID)  RVPGX2  CULTURE, BLOOD (ROUTINE X 2)  CULTURE, BLOOD (ROUTINE X 2)  LACTIC ACID, PLASMA  HIV ANTIBODY (ROUTINE TESTING W REFLEX)  PROTIME-INR  CORTISOL-AM, BLOOD  BASIC METABOLIC PANEL WITH GFR  CBC   Labs notable for mildly reduced white count.  Normal lactic acid is reassuring  EKG  Interpreted by me at 1459 heart rate 105 QRS 80 QTc 430 Sinus tachycardia slight baseline artifact.  No evidence of acute ischemia   RADIOLOGY Chest x-ray interpreted by me as left upper lobe infiltrate    PROCEDURES:  Critical Care performed: No  Procedures   MEDICATIONS ORDERED IN ED: Medications  LORazepam  (ATIVAN ) tablet 1 mg (has no administration in time range)  thyroid  (ARMOUR) tablet 90 mg (has no administration in time range)  calcium  carbonate (TUMS - dosed in mg elemental calcium ) chewable tablet 400-600 mg of elemental calcium  (has no administration in time range)  docusate sodium  (COLACE) capsule 100 mg (has no administration in time range)  baclofen  (LIORESAL ) tablet 5 mg (has no  administration in time range)  ascorbic acid  (VITAMIN C ) tablet 250 mg (has no  administration in time range)  cholecalciferol  (VITAMIN D3) 25 MCG (1000 UNIT) tablet 2,000 Units (has no administration in time range)  albuterol  (PROVENTIL ) (2.5 MG/3ML) 0.083% nebulizer solution 2.5 mg (has no administration in time range)  camphor-menthol  (SARNA) lotion 1 Application (has no administration in time range)  diclofenac  Sodium (VOLTAREN ) 1 % topical gel 2 g (has no administration in time range)  tacrolimus  (PROTOPIC ) 0.03 % ointment (has no administration in time range)  Varenicline  Tartrate SOLN 1 spray (has no administration in time range)  lactated ringers  infusion (150 mL/hr Intravenous New Bag/Given 05/19/23 2326)  enoxaparin  (LOVENOX ) injection 40 mg (40 mg Subcutaneous Not Given 05/19/23 2325)  acetaminophen  (TYLENOL ) tablet 650 mg (has no administration in time range)    Or  acetaminophen  (TYLENOL ) suppository 650 mg (has no administration in time range)  traZODone  (DESYREL ) tablet 25 mg (has no administration in time range)  magnesium  hydroxide (MILK OF MAGNESIA) suspension 30 mL (has no administration in time range)  ondansetron  (ZOFRAN ) tablet 4 mg (has no administration in time range)    Or  ondansetron  (ZOFRAN ) injection 4 mg (has no administration in time range)  levofloxacin  (LEVAQUIN ) IVPB 750 mg (has no administration in time range)  sodium chloride  0.9 % bolus 1,000 mL (0 mLs Intravenous Stopped 05/19/23 1816)  ondansetron  (ZOFRAN ) injection 4 mg (4 mg Intravenous Given 05/19/23 1534)  chlorpheniramine-HYDROcodone  (TUSSIONEX) 10-8 MG/5ML suspension 5 mL (5 mLs Oral Given 05/19/23 1535)  levofloxacin  (LEVAQUIN ) IVPB 750 mg (0 mg Intravenous Stopped 05/19/23 2007)  acetaminophen  (TYLENOL ) tablet 1,000 mg (1,000 mg Oral Given 05/19/23 1955)     IMPRESSION / MDM / ASSESSMENT AND PLAN / ED COURSE  I reviewed the triage vital signs and the nursing notes.                               Differential diagnosis includes, but is not limited to, upper respiratory infection bronchitis, pneumonia, no findings or clinical history of be highly suggestive of acute ACS, pulmonary embolism, pneumothorax, etc. her workup fairly reassuring but she does have mildly reduced white count.  She is initially slightly tachycardic but this resolved after IV fluids.    Patient's presentation is most consistent with acute complicated illness / injury requiring diagnostic workup.   Patient trial ambulation continues to feel slightly dyspneic lightheaded, fatigued, noted to have now persistent slight tachycardia.  Consistent with sepsis.  Discussed with the patient initially considered potentially discharging but given ongoing symptomatology will admit for further care and treatment.  Consulted with patient accepted to hospital service by Dr. Achilles Holes       FINAL CLINICAL IMPRESSION(S) / ED DIAGNOSES   Final diagnoses:  Sepsis due to pneumonia [facility])     Rx / DC Orders   ED Discharge Orders     None        Note:  This document was prepared using Dragon voice recognition software and may include unintentional dictation errors.   Iver Marker, MD 05/20/23 [phone]

## 2023-05-19 NOTE — ED Triage Notes (Signed)
 Patient reports about a week ago she started having cough and congestion and has progressed into fever and sob. Reports she hears crackling in her chest.

## 2023-05-19 NOTE — H&P (Signed)
 [place]   PATIENT NAME: Karen Aguilar    MR#:  [id number]  DATE OF BIRTH:  05/29/1965  DATE OF ADMISSION:  05/19/2023  PRIMARY CARE PHYSICIAN: Claire Crick, MD   Patient is coming from: Home  REQUESTING/REFERRING PHYSICIAN: Iver Marker, MD  CHIEF COMPLAINT:   Chief Complaint  Patient presents with   Shortness of Breath   Cough    HISTORY OF PRESENT ILLNESS:  ANAHY ESH is a 58 y.o. Caucasian female with medical history significant for anxiety, osteoarthritis, chronic pain, DDD, depression, diverticulosis, GERD, hypothyroidism, fibromyalgia, dyslipidemia, IBS, who presented to the emergency room with acute onset of dry cough with associated dyspnea and chest congestion as well as head congestion, fever that was up to 10 2 in the morning and associated chills for the last 3 days.  She was seen in a televisit and was given doxycycline  which she has been using over the last 3 days for suspected bronchitis.  She admits to nausea without vomiting.  No dysuria, oliguria or hematuria or flank pain.  She has been having loose stools with doxycycline .  No chest pain or palpitations.  ED Course: When the patient came to the ER, temperature was 100.8 and BP was 125/92 with otherwise normal vital signs.  Labs revealed a sodium of 133 with chloride 96 and glucose 126.  CBC showed leukopenia of 3.9.  Respiratory panel came back negative.  Blood cultures were drawn. EKG as reviewed by me : EKG showed sinus tachycardia with a rate of 103 with poor R wave progression. Imaging: 2 view chest x-ray showed airspace opacity in the left upper lung favoring pneumonia.  The patient was given IV Levaquin , 4 mg IV Zofran  and 1 L bolus of IV normal saline.  She will be admitted to a medical telemetry bed for further evaluation and management. PAST MEDICAL HISTORY:   Past Medical History:  Diagnosis Date   Abnormal MRI scan, bone 2012   abnormal marrow signal humeral head, glenoid and scapula  - diffuse replacement of fatty marrow - likely benign, no need for further investigation [place] [facility])   Agnosia for temperature    Right Leg   Anemia    etiology unknown-iron infusion in past,last 9/12   Anxiety    Anxiety and depression    Arachnoiditis    S1 nerve root, 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 Marcelino Sera [facility]   COVID-19 virus infection 09/2018   DDD (degenerative disc disease), cervical    DDD (degenerative disc disease), lumbar    s/p permanent nerve damage after back surgery   Depression    Depression with anxiety    Diverticulosis    Eczema    Family history of adverse reaction to anesthesia    Mother - nausea   Fibromyalgia    Foot drop    right - numbness, tingling - twisting   Gallstones    GERD (gastroesophageal reflux disease)    rare zantac  PRN N/V at times with this   Headache(784.0)    migraines   Hemorrhoid    Hemorrhoids    History of kidney stones    Hyperlipidemia    Hypothyroidism 1990s   hashimoto's thyroiditis   IBS (irritable bowel syndrome)    IBS (irritable bowel syndrome)    Incontinence of urine    s/p sling procedure-unresolved   Iron deficiency    h/o anemia, s/p iron infusion (2012)  Liver hemangioma     x2   Memory change    due to medications   Migraines    and h/o optical migraine   Migraines    Mixed urge and stress incontinence 2008   extensive workup including urodynamics, failed miltiple anticholinergics including myrbetriq and urogesic blue s/p PTNS, normal cystoscopy 2014, seen by Dr Clarke Crouch and Dr Ace Holder   Neurogenic bladder 2008   after 13 spine surgeries with periph neuropathy and chronic LBP   Neuropathy of lower extremity 2011   Nocturia    Notalgia paresthetica    Osteoarthritis 12/2013   PONV (postoperative nausea and vomiting)    Pulmonary nodule 04/2012   7.38mm RLL nodule - resolved on imaging 12/2012   Sleep apnea    mild-doesn't use cpap   Urine frequency     Vitamin D  deficiency     PAST SURGICAL HISTORY:   Past Surgical History:  Procedure Laterality Date   ANTERIOR CERVICAL DECOMP/DISCECTOMY FUSION     C3, 4, 5 - improved after surgery   ANTERIOR CERVICAL DECOMP/DISCECTOMY FUSION  07/2017   C6/7 Dr Hezekiah Louis   ARTERY BIOPSY Left 09/02/2019   Procedure: BIOPSY TEMPORAL ARTERY;  Surgeon: Eldred Grego, MD;  Location: [facility] ORS;  Service: General;  Laterality: Left;   BACK SURGERY     multiple-L4-s1,hardware,fusion,removal,infection s/p 5/11 procedure   BACK SURGERY  02/16/2011   "my 7th back OR; Procedure: Exploration of fusion removal of hardware L4-S1, harvesting of right iliac crest bone graft, redo posterior lateral fusion L4-5 using iliac crest bone graft BMP and master graft replacement of bilateral L4 screws with a Telfa tech 7.5 x 40 mm screw on the right and 8.5 x 40 mm screw on the left placement of a large Hemovac drain   CESAREAN SECTION  1996; 2001   x 2   CHOLECYSTECTOMY  2001   COLONOSCOPY  10/2016   rpt 10 yrs Peg Bouton)   COLONOSCOPY  03/2019   1 polyp, biopsies taken ([place])   ESOPHAGEAL MANOMETRY  06/2017   WNL (Dr Welford Haley)   ESOPHAGOGASTRODUODENOSCOPY  03/2013   early esophageal stricture dilated, o/w WNL Arvie Latus)   ESOPHAGOGASTRODUODENOSCOPY  12/2014   WNL, s/p empiric dilation of esophagus Elvin Hammer)   ESOPHAGOGASTRODUODENOSCOPY  10/2015   WNL, biopsies WNL as well (minimal esophagitis with reactive changes) Nickey Barn)   ESOPHAGOGASTRODUODENOSCOPY  10/2016   biopsy suggestive of ulcer ([place])   ESOPHAGOGASTRODUODENOSCOPY  03/2019   mild portal hypertensive gastropathy, gastric erosion without bleeding, duodenal diverticulum ([place])   ESOPHAGOGASTRODUODENOSCOPY  09/2020   normal esophagus with mild edema, no gastritis, H pylori neg (Dr Elvan Hamel at [facility])   ESOPHAGOGASTRODUODENOSCOPY  08/31/2020   biopsy with reactive gastropathy ([facility])   HEMORRHOID BANDING     LASIK Bilateral    LUMBAR WOUND DEBRIDEMENT   03/15/2011   Procedure: LUMBAR WOUND DEBRIDEMENT;  Surgeon: Ferris Hua, MD;  Lumbar Wound Debridement   MANDIBLE RECONSTRUCTION  1986   PFT  01/28/2013   WNL   PUBOVAGINAL SLING  2008   midurethral sling   SHOULDER CLOSED REDUCTION Left 12/18/2010   Procedure: CLOSED MANIPULATION SHOULDER;  Surgeon: Loel Ring; labrial debridement   SPINAL CORD STIMULATOR INSERTION N/A 08/19/2015   Procedure: LUMBAR SPINAL CORD STIMULATOR INSERTION;  Surgeon: Manya Sells, MD;  Location: [facility];  Service: Neurosurgery;  Laterality: N/A;  LUMBAR SPINAL CORD STIMULATOR INSERTION   SPINE SURGERY     TONSILLECTOMY  1972   as a child; "then they grew  back"    SOCIAL HISTORY:   Social History   Tobacco Use   Smoking status: Never    Passive exposure: Past (as a child)   Smokeless tobacco: Never  Substance Use Topics   Alcohol  use: Not Currently    Alcohol /week: 1.0 standard drink of alcohol     Types: 1 Glasses of wine per week    Comment: rarely drinks wine    FAMILY HISTORY:   Family History  Problem Relation Age of Onset   Thyroid  disease Mother        ?empty sella   Irritable bowel syndrome Mother    Dysphagia Mother    Lung cancer Father 45        (smoker)   Diabetes Brother    Alzheimer's disease Maternal Grandmother    CAD Maternal Grandfather 89       MI   Heart disease Maternal Grandfather    Heart attack Maternal Grandfather    Colon cancer Paternal Grandmother    Lung cancer Paternal Grandfather         (smoker)   Diabetes Daughter    Breast cancer Maternal Aunt    Thyroid  disease Cousin    Cancer Other        unsure (ovarian/uterine)    DRUG ALLERGIES:   Allergies  Allergen Reactions   Augmentin  [Amoxicillin -Pot Clavulanate] Swelling    Lip swelling and irritation.    Fluconazole  Swelling    Lips - swelling, bleeding, blisters, cracking, peeling   Erythromycin Nausea And Vomiting   Influenza Vaccines Hives and Swelling    Redness, heat, and extreme  swelling at site   Levothyroxine  Hives   Thimerosal (Thiomersal) Swelling    Local reaction-redness,swelling; veins popping   Tomato Other (See Comments)    Severe acid reflux with all acid foods   Atogepant Other (See Comments)    Gi symptoms   Atorvastatin  Other (See Comments)    GI upset, even once weekly dosing   Adhesive [Tape] Rash    blisters   Azithromycin Rash   Gabapentin Other (See Comments)    Short term memory loss   Imitrex [Sumatriptan] Other (See Comments)    Rebound migraine    Lyrica [Pregabalin] Other (See Comments)    Short term memory loss   Macrodantin  [Nitrofurantoin ] Rash   Macrolides And Ketolides Rash   Nickel Swelling and Rash   Sulfa Antibiotics Rash   Sulfasalazine Rash   Troleandomycin Rash   Vancomycin  Itching and Rash    Developed Redman's, may require premedication and infusion rate reduction in the future    REVIEW OF SYSTEMS:   ROS As per history of present illness. All pertinent systems were reviewed above. Constitutional, HEENT, cardiovascular, respiratory, GI, GU, musculoskeletal, neuro, psychiatric, endocrine, integumentary and hematologic systems were reviewed and are otherwise negative/unremarkable except for positive findings mentioned above in the HPI.   MEDICATIONS AT HOME:   Prior to Admission medications   Medication Sig Start Date End Date Taking? Authorizing Provider  albuterol  (VENTOLIN  HFA) 108 (90 Base) MCG/ACT inhaler Inhale 2 puffs into the lungs every 6 (six) hours as needed for wheezing or shortness of breath. 05/17/23   Mardene Shake, FNP  Ascorbic Acid  (VITAMIN C  PO) Take 1 tablet by mouth as needed (for immune system support).     [provider]  baclofen  (LIORESAL ) 10 MG tablet Take 0.5 tablets (5 mg total) by mouth 3 (three) times daily as needed. 07/14/21   Donnie Galea, MD  calcium  carbonate (  TUMS EX) 750 MG chewable tablet Chew 2-3 tablets by mouth 2 (two) times daily as needed.    [provider]  camphor-menthol  Eyvonne Hollering) lotion Apply 1 application topically at bedtime as needed for itching.     [provider]  Carboxymethylcellulose Sodium (EYE DROPS OP) Apply to eye. PRGF eye drops. 1 drop 4-6 times a day in left eye    [provider]  Cholecalciferol  (VITAMIN D ) 2000 units CAPS Take 1 capsule (2,000 Units total) by mouth daily. 10/10/15   Claire Crick, MD  diclofenac  sodium (VOLTAREN ) 1 % GEL APPLY THIN LAYER EXTERNALLY TO THE AFFECTED AREA UP TO FOUR TIMES DAILY AS NEEDED 12/31/17   Arnie Lao, MD  docusate sodium  (COLACE) 100 MG capsule Take 100 mg by mouth 2 (two) times daily. As needed    [provider]  doxycycline  (VIBRA -TABS) 100 MG tablet Take 1 tablet (100 mg total) by mouth 2 (two) times daily for 7 days. 05/17/23 05/24/23  Mardene Shake, FNP  LORazepam  (ATIVAN ) 1 MG tablet TAKE 1 TABLET BY MOUTH TWICE DAILY AS NEEDED FOR ANXIETY 04/12/23   Claire Crick, MD  loteprednol  (LOTEMAX ) 0.5 % ophthalmic suspension Take 1 drop both eyes:  -4x/day for 1 week, then  -3x/day for 1 week, then  -2x/day for 1 week, then  -1x/day and stay at 1x/day until seen again 03/20/23   [provider]  NONFORMULARY OR COMPOUNDED ITEM Hydroeye supplement - with fish oil, vitamin C , A, E, B6 12/01/21   Claire Crick, MD  NUCYNTA  ER 250 MG TB12 SMARTSIG:1 Tablet(s) By Mouth Every 12 Hours 07/13/21   [provider]  ondansetron  (ZOFRAN -ODT) 4 MG disintegrating tablet Take 1 tablet (4 mg total) by mouth every 8 (eight) hours as needed for nausea or vomiting. 02/21/21   Maggie Schooner, MD  promethazine -dextromethorphan  (PROMETHAZINE -DM) 6.25-15 MG/5ML syrup Take 5 mLs by mouth 4 (four) times daily as needed for cough. 05/17/23   Mardene Shake, FNP  tacrolimus  (PROTOPIC ) 0.03 % ointment Apply to eye. Custom compound 03/20/23 03/19/24  [provider]  thyroid  (ARMOUR) 90 MG tablet Take 1 tablet (90 mg total) by mouth daily. One  day a week take 1/2 tablet 12/02/20   Claire Crick, MD  TYRVAYA  0.03 MG/ACT SOLN Place 1 spray into both nostrils 2 (two) times daily.    [provider]      VITAL SIGNS:  Blood pressure (!) 125/94, pulse 90, temperature 99.7 F (37.6 C), temperature source Oral, resp. rate 20, height 5\' 7"  (1.702 m), weight 89.4 kg, last menstrual period 10/04/2014, SpO2 100%.  PHYSICAL EXAMINATION:  Physical Exam  GENERAL:  58 y.o.-year-old Caucasian female patient lying in the bed with mild respiratory distress with conversational dyspnea. EYES: Pupils equal, round, reactive to light and accommodation. No scleral icterus. Extraocular muscles intact.  HEENT: Head atraumatic, normocephalic. Oropharynx and nasopharynx clear.  NECK:  Supple, no jugular venous distention. No thyroid  enlargement, no tenderness.  LUNGS: Diminished left breath sounds with associated crackles.no use of accessory muscles of respiration.  CARDIOVASCULAR: Regular rate and rhythm, S1, S2 normal. No murmurs, rubs, or gallops.  ABDOMEN: Soft, nondistended, nontender. Bowel sounds present. No organomegaly or mass.  EXTREMITIES: No pedal edema, cyanosis, or clubbing.  NEUROLOGIC: Cranial nerves II through XII are intact. Muscle strength 5/5 in all extremities. Sensation intact. Gait not checked.  PSYCHIATRIC: The patient is alert and oriented x 3.  Normal affect and good eye contact. SKIN: No obvious rash, lesion, or  ulcer.   LABORATORY PANEL:   CBC Recent Labs  Lab 05/19/23 1433  WBC 3.9*  HGB 13.2  HCT 39.1  PLT 192   ------------------------------------------------------------------------------------------------------------------  Chemistries  Recent Labs  Lab 05/19/23 1433  NA 133*  K 3.5  CL 96*  CO2 27  GLUCOSE 126*  BUN 9  CREATININE 0.84  CALCIUM  8.7*   ------------------------------------------------------------------------------------------------------------------  Cardiac Enzymes No  results for input(s): "TROPONINI" in the last 168 hours. ------------------------------------------------------------------------------------------------------------------  RADIOLOGY:  DG Chest 2 View Result Date: 05/19/2023 CLINICAL DATA:  sob EXAM: CHEST - 2 VIEW COMPARISON:  April 02, 2021 FINDINGS: The cardiomediastinal silhouette is unchanged in contour. No pleural effusion. No pneumothorax. There is a airspace opacity of the LEFT upper lung. Visualized abdomen is unremarkable. Spinal stimulator. IMPRESSION: Airspace opacity of the LEFT upper lung, favored pneumonia. Recommend follow-up PA and lateral chest radiograph in 4-6 weeks to assess for resolution. Electronically Signed   By: Clancy Crimes M.D.   On: 05/19/2023 15:08      IMPRESSION AND PLAN:  Assessment and Plan: * Sepsis due to pneumonia [facility]) - The patient will be admitted to a medical telemetry bed. - Will continue antibiotic therapy with IV Levaquin . - Mucolytic therapy be provided as well as duo nebs q.i.d. and q.4 hours p.r.n. - We will follow blood cultures. - Will be hydrated with IV lactated ringer . - Sepsis manifested by leukopenia and tachycardia with fever.   Hypothyroidism - Will continue Armour Thyroid   Anxiety - Will continue Ativan   Chronic pain syndrome - Will continue her chronic pain medications   DVT prophylaxis: Lovenox .  Advanced Care Planning:  Code Status: full code.  Family Communication:  The plan of care was discussed in details with the patient (and family). I answered all questions. The patient agreed to proceed with the above mentioned plan. Further management will depend upon hospital course. Disposition Plan: Back to previous home environment Consults called: none.  All the records are reviewed and case discussed with ED provider.  Status is: Inpatient  At the time of the admission, it appears that the appropriate admission status for this patient is inpatient.  This is judged  to be reasonable and necessary in order to provide the required intensity of service to ensure the patient's safety given the presenting symptoms, physical exam findings and initial radiographic and laboratory data in the context of comorbid conditions.  The patient requires inpatient status due to high intensity of service, high risk of further deterioration and high frequency of surveillance required.  I certify that at the time of admission, it is my clinical judgment that the patient will require inpatient hospital care extending more than 2 midnights.                            Dispo: The patient is from: Home              Anticipated d/c is to: Home              Patient currently is not medically stable to d/c.              Difficult to place patient: No  Virgene Griffin M.D on 05/20/2023 at 6:52 AM  [facility]   From 7 PM-7 AM, contact night-coverage [email or web address]  CC: Primary care physician; Claire Crick, MD

## 2023-05-19 NOTE — ED Provider Notes (Signed)
 Vitals:   05/19/23 1635 05/19/23 1641  BP:  123/81  Pulse:  94  Resp:  12  Temp: 100 F (37.8 C)   SpO2:  95%     Patient's vital signs have normalized.  She is resting comfortably.  Discussed with her and her husband, they would like to continue her care at home.  Will trial ambulation and pulse oximetry to evaluate further and make certain that she truly does feel well.  She is currently on doxycycline  but is only endured really 1-2 doses, she reports she vomited and did not tolerate it well.  She is awake alert well-oriented.  No oxygen  requirement vital signs normalized.  I do not see evidence of suggest severe sepsis or shock.  Her lactate reassuring.  Will ambulate the patient and if she feels well without oxygen  deficit and reassuring symptomatology I think she will be able to continue care with Levaquin .  Levaquin  selected due to her multiple allergies and report of severe penicillin type allergy she reports she cannot have any sealant type medications   Iver Marker, MD 05/19/23 1801

## 2023-05-19 NOTE — ED Notes (Signed)
 Patient ambulated in room by Amy, RN. Patient's O2 dropped down between 90-91% on RA. Pt c/o dizziness and nausea while ambulating.

## 2023-05-20 DIAGNOSIS — E039 Hypothyroidism, unspecified: Secondary | ICD-10-CM

## 2023-05-20 DIAGNOSIS — J189 Pneumonia, unspecified organism: Secondary | ICD-10-CM | POA: Diagnosis not present

## 2023-05-20 DIAGNOSIS — A419 Sepsis, unspecified organism: Secondary | ICD-10-CM | POA: Diagnosis not present

## 2023-05-20 DIAGNOSIS — F419 Anxiety disorder, unspecified: Secondary | ICD-10-CM

## 2023-05-20 DIAGNOSIS — G894 Chronic pain syndrome: Secondary | ICD-10-CM | POA: Diagnosis not present

## 2023-05-20 LAB — CBC
HCT: 36.4 % (ref 36.0–46.0)
Hemoglobin: 12.1 g/dL (ref 12.0–15.0)
MCH: 29.5 pg (ref 26.0–34.0)
MCHC: 33.2 g/dL (ref 30.0–36.0)
MCV: 88.8 fL (ref 80.0–100.0)
Platelets: 178 10*3/uL (ref 150–400)
RBC: 4.1 MIL/uL (ref 3.87–5.11)
RDW: 12.3 % (ref 11.5–15.5)
WBC: 3.1 10*3/uL — ABNORMAL LOW (ref 4.0–10.5)
nRBC: 0 % (ref 0.0–0.2)

## 2023-05-20 LAB — PROCALCITONIN: Procalcitonin: <0.1 ng/mL

## 2023-05-20 LAB — BASIC METABOLIC PANEL WITH GFR
Anion gap: 5 (ref 5–15)
BUN: 8 mg/dL (ref 6–20)
CO2: 32 mmol/L (ref 22–32)
Calcium: 8.5 mg/dL — ABNORMAL LOW (ref 8.9–10.3)
Chloride: 101 mmol/L (ref 98–111)
Creatinine, Ser: 0.78 mg/dL (ref 0.44–1.00)
GFR, Estimated: >60 mL/min (ref >60)
Glucose, Bld: 95 mg/dL (ref 70–99)
Potassium: 3.9 mmol/L (ref 3.5–5.1)
Sodium: 138 mmol/L (ref 135–145)

## 2023-05-20 LAB — CORTISOL-AM, BLOOD: Cortisol - AM: 6.4 ug/dL — ABNORMAL LOW (ref 6.7–22.6)

## 2023-05-20 LAB — PROTIME-INR
INR: 1 (ref 0.8–1.2)
Prothrombin Time: 13.6 s (ref 11.4–15.2)

## 2023-05-20 MED ORDER — GUAIFENESIN ER 600 MG PO TB12
600.0000 mg | ORAL_TABLET | Freq: Two times a day (BID) | ORAL | Status: DC
  Administered 2023-05-20 – 2023-05-21 (×3): 600 mg via ORAL
  Filled 2023-05-20 (×3): qty 1

## 2023-05-20 MED ORDER — IPRATROPIUM-ALBUTEROL 0.5-2.5 (3) MG/3ML IN SOLN
3.0000 mL | Freq: Three times a day (TID) | RESPIRATORY_TRACT | Status: DC
  Administered 2023-05-20 – 2023-05-21 (×2): 3 mL via RESPIRATORY_TRACT
  Filled 2023-05-20 (×2): qty 3

## 2023-05-20 MED ORDER — IPRATROPIUM-ALBUTEROL 0.5-2.5 (3) MG/3ML IN SOLN
3.0000 mL | Freq: Four times a day (QID) | RESPIRATORY_TRACT | Status: DC
  Administered 2023-05-20: 3 mL via RESPIRATORY_TRACT
  Filled 2023-05-20: qty 3

## 2023-05-20 MED ORDER — IPRATROPIUM-ALBUTEROL 0.5-2.5 (3) MG/3ML IN SOLN: 3.0000 mL | RESPIRATORY_TRACT | Status: DC | PRN

## 2023-05-20 MED ORDER — TETRAHYDROZOLINE HCL 0.05 % OP SOLN
1.0000 [drp] | Freq: Four times a day (QID) | OPHTHALMIC | Status: DC
  Filled 2023-05-20: qty 15

## 2023-05-20 MED ORDER — TAPENTADOL HCL ER 50 MG PO TB12
250.0000 mg | ORAL_TABLET | Freq: Two times a day (BID) | ORAL | Status: DC
  Administered 2023-05-20 – 2023-05-21 (×3): 250 mg via ORAL
  Filled 2023-05-20 (×5): qty 5

## 2023-05-20 MED ORDER — HYDROCOD POLI-CHLORPHE POLI ER 10-8 MG/5ML PO SUER
5.0000 mL | Freq: Two times a day (BID) | ORAL | Status: DC | PRN
  Administered 2023-05-20 – 2023-05-21 (×3): 5 mL via ORAL
  Filled 2023-05-20 (×3): qty 5

## 2023-05-20 NOTE — Progress Notes (Signed)
 PROGRESS NOTE    Karen Aguilar  ZOX:[id number] DOB: [birth year] DOA: 05/19/2023 PCP: Claire Crick, MD  Chief Complaint  Patient presents with   Shortness of Breath   Cough    Hospital Course:  Karen Aguilar who carries diagnoses of depression, fibromyalgia, anxiety, chronic pain, dyslipidemia, IBS, diverticulosis, GERD, hypothyroidism, degenerative disc disease, osteoarthritis, and has 21 reported drug allergies.  She reports presents to the ED on this admission complaining of dyspnea, fever, and cough.  Patient reports she was recently seen for televisit and was prescribed doxycycline  which she has taken for 3 days prior to arrival.  She endorses loose stools secondary to the doxycycline . In the ED she was found to be febrile to 100.8, tachycardic to 103.  CBC revealed leukopenia of 3.9.  CMP with sodium 133, chloride 96.  Respiratory viral panel found to be negative.  Blood cultures were taken.  Chest x-ray revealed left upper lung pneumonia.  She was started on IV Levaquin  and admitted for observation.  Subjective: Patient endorses overall feeling unwell today.  Reports she cannot take deep breaths.   Objective: Vitals:   05/20/23 0607 05/20/23 0700 05/20/23 0933 05/20/23 1118  BP:  123/73 126/81   Pulse:  91 91   Resp:   20   Temp: 99.7 F (37.6 C)  99.8 F (37.7 C)   TempSrc: Oral  Oral   SpO2:  97% 96% 99%  Weight:      Height:        Intake/Output Summary (Last 24 hours) at 05/20/2023 1453 Last data filed at 05/19/2023 2007 Gross per 24 hour  Intake 150 ml  Output --  Net 150 ml   Filed Weights   05/19/23 1420  Weight: 89.4 kg    Examination: General exam: Appears calm and comfortable, NAD  Respiratory system: No work of breathing, symmetric chest wall expansion, good aeration bilaterally, no wheezing, no rales.  No coughing appreciated, no oxygen  requirement. Cardiovascular system: S1 & S2 heard, RRR.  Gastrointestinal system: Abdomen is nondistended, soft  and nontender.  Neuro: Alert and oriented. No focal neurological deficits. Extremities: Symmetric, expected ROM Skin: No rashes, lesions Psychiatry: Demonstrates appropriate judgement and insight. Mood & affect appropriate for situation.   Assessment & Plan:  Principal Problem:   Sepsis due to pneumonia [facility]) Active Problems:   Hypothyroidism   Anxiety   Chronic pain syndrome   Left upper lung pneumonia, organism unspecified Sepsis: Tachycardia, leukopenia, fever. - Monitor on telemetry - Continue IV Levaquin  - Symptomatic support with DuoNebs and mucolytic therapies - Clinical exam reassuring - Status post IV fluids - Encourage p.o. intake - On room air.  Continue to monitor respiratory status  Hypothyroid - Resume home meds  Anxiety Depression Fibromyalgia Chronic Fatigue - Resume all home medications.  Neurotrophic keratopath  -On Nucynta .  Continue at home dose  Neurotrophic keratopath  - Resume all home eyedrops - Follow-up closely with outpatient ophthalmology  DVT prophylaxis: Lovenox    Code Status: Full Code Disposition: Anticipate discharge home tomorrow.  No skilled needs identified at this time  Consultants:    Procedures:    Antimicrobials:  Anti-infectives (From admission, onward)    Start     Dose/Rate Route Frequency Ordered Stop   05/20/23 1800  levofloxacin  (LEVAQUIN ) IVPB 750 mg        750 mg 100 mL/hr over 90 Minutes Intravenous Every 24 hours 05/19/23 2116 05/24/23 1759   05/19/23 2115  levofloxacin  (LEVAQUIN ) IVPB 750 mg  Status:  Discontinued        750 mg 100 mL/hr over 90 Minutes Intravenous  Once 05/19/23 2108 05/19/23 2116   05/19/23 1800  levofloxacin  (LEVAQUIN ) IVPB 750 mg        750 mg 100 mL/hr over 90 Minutes Intravenous  Once 05/19/23 1746 05/19/23 2007       Data Reviewed: I have personally reviewed following labs and imaging studies CBC: Recent Labs  Lab 05/19/23 1433 05/20/23 0718  WBC 3.9* 3.1*  HGB 13.2  12.1  HCT 39.1 36.4  MCV 89.7 88.8  PLT 192 178   Basic Metabolic Panel: Recent Labs  Lab 05/19/23 1433 05/20/23 0718  NA 133* 138  K 3.5 3.9  CL 96* 101  CO2 27 32  GLUCOSE 126* 95  BUN 9 8  CREATININE 0.84 0.78  CALCIUM  8.7* 8.5*   GFR: Estimated Creatinine Clearance: 89 mL/min (by C-G formula based on SCr of 0.78 mg/dL). Liver Function Tests: No results for input(s): "AST", "ALT", "ALKPHOS", "BILITOT", "PROT", "ALBUMIN" in the last 168 hours. CBG: No results for input(s): "GLUCAP" in the last 168 hours.  Recent Results (from the past 240 hours)  Resp panel by RT-PCR (RSV, Flu A&B, Covid) Anterior Nasal Swab     Status: None   Collection Time: 05/19/23  3:29 PM   Specimen: Anterior Nasal Swab  Result Value Ref Range Status   SARS Coronavirus 2 by RT PCR NEGATIVE NEGATIVE Final    Comment: (NOTE) SARS-CoV-2 target nucleic acids are NOT DETECTED.  The SARS-CoV-2 RNA is generally detectable in upper respiratory specimens during the acute phase of infection. The lowest concentration of SARS-CoV-2 viral copies this assay can detect is 138 copies/mL. A negative result does not preclude SARS-Cov-2 infection and should not be used as the sole basis for treatment or other patient management decisions. A negative result may occur with  improper specimen collection/handling, submission of specimen other than nasopharyngeal swab, presence of viral mutation(s) within the areas targeted by this assay, and inadequate number of viral copies(<138 copies/mL). A negative result must be combined with clinical observations, patient history, and epidemiological information. The expected result is Negative.  Fact Sheet for Patients:  BloggerCourse.com  Fact Sheet for Healthcare Providers:  SeriousBroker.it  This test is no t yet approved or cleared by the [place]  FDA and  has been authorized for detection and/or diagnosis of  SARS-CoV-2 by FDA under an Emergency Use Authorization (EUA). This EUA will remain  in effect (meaning this test can be used) for the duration of the COVID-19 declaration under Section 564(b)(1) of the Act, 21 U.S.C.section 360bbb-3(b)(1), unless the authorization is terminated  or revoked sooner.       Influenza A by PCR NEGATIVE NEGATIVE Final   Influenza B by PCR NEGATIVE NEGATIVE Final    Comment: (NOTE) The Xpert Xpress SARS-CoV-2/FLU/RSV plus assay is intended as an aid in the diagnosis of influenza from Nasopharyngeal swab specimens and should not be used as a sole basis for treatment. Nasal washings and aspirates are unacceptable for Xpert Xpress SARS-CoV-2/FLU/RSV testing.  Fact Sheet for Patients: BloggerCourse.com  Fact Sheet for Healthcare Providers: SeriousBroker.it  This test is not yet approved or cleared by the [place]  FDA and has been authorized for detection and/or diagnosis of SARS-CoV-2 by FDA under an Emergency Use Authorization (EUA). This EUA will remain in effect (meaning this test can be used) for the duration of the COVID-19 declaration under Section 564(b)(1) of the Act, 21 U.S.C. section 360bbb-3(b)(1),  unless the authorization is terminated or revoked.     Resp Syncytial Virus by PCR NEGATIVE NEGATIVE Final    Comment: (NOTE) Fact Sheet for Patients: BloggerCourse.com  Fact Sheet for Healthcare Providers: SeriousBroker.it  This test is not yet approved or cleared by the [place]  FDA and has been authorized for detection and/or diagnosis of SARS-CoV-2 by FDA under an Emergency Use Authorization (EUA). This EUA will remain in effect (meaning this test can be used) for the duration of the COVID-19 declaration under Section 564(b)(1) of the Act, 21 U.S.C. section 360bbb-3(b)(1), unless the authorization is terminated  or revoked.  Performed at [facility], [address] [place], [place] [address]   Blood Culture (routine x 2)     Status: None (Preliminary result)   Collection Time: 05/19/23  3:29 PM   Specimen: BLOOD  Result Value Ref Range Status   Specimen Description BLOOD BLOOD LEFT HAND  Final   Special Requests   Final    BOTTLES DRAWN AEROBIC AND ANAEROBIC Blood Culture results may not be optimal due to an inadequate volume of blood received in culture bottles   Culture   Final    NO GROWTH < 24 HOURS Performed at [facility], [address], [place], [place] [address]    Report Status PENDING  Incomplete  Blood Culture (routine x 2)     Status: None (Preliminary result)   Collection Time: 05/19/23  3:29 PM   Specimen: BLOOD  Result Value Ref Range Status   Specimen Description BLOOD RIGHT ANTECUBITAL  Final   Special Requests   Final    BOTTLES DRAWN AEROBIC AND ANAEROBIC Blood Culture adequate volume   Culture   Final    NO GROWTH < 24 HOURS Performed at [facility], [address], [place], [place] [address]    Report Status PENDING  Incomplete     Radiology Studies: DG Chest 2 View Result Date: 05/19/2023 CLINICAL DATA:  sob EXAM: CHEST - 2 VIEW COMPARISON:  April 02, 2021 FINDINGS: The cardiomediastinal silhouette is unchanged in contour. No pleural effusion. No pneumothorax. There is a airspace opacity of the LEFT upper lung. Visualized abdomen is unremarkable. Spinal stimulator. IMPRESSION: Airspace opacity of the LEFT upper lung, favored pneumonia. Recommend follow-up PA and lateral chest radiograph in 4-6 weeks to assess for resolution. Electronically Signed   By: Clancy Crimes M.D.   On: 05/19/2023 15:08    Scheduled Meds:  cholecalciferol   2,000 Units Oral Daily   enoxaparin  (LOVENOX ) injection  40 mg Subcutaneous Q24H   guaiFENesin   600 mg Oral BID   ipratropium-albuterol   3 mL Nebulization TID   tapentadol   250 mg Oral BID    tetrahydrozoline  1 drop Left Eye QID   thyroid   90 mg Oral QAC breakfast   Varenicline  Tartrate  1 spray Each Nare BID   Continuous Infusions:  levofloxacin  (LEVAQUIN ) IV       LOS: 1 day  MDM: Patient is high risk for one or more organ failure.  They necessitate ongoing hospitalization for continued IV therapies and subsequent lab monitoring. Total time spent interpreting labs and vitals, reviewing the medical record, coordinating care amongst consultants and care team members, directly assessing and discussing care with the patient and/or family: 55 min  Octavie Westerhold, DO [facility]  To contact the attending physician between 7A-7P please use Epic Chat. To contact the covering physician during after hours 7P-7A, please review Amion.  05/20/2023, 2:53 PM   *  This document has been created with the assistance of dictation software. Please excuse typographical errors. *

## 2023-05-20 NOTE — Assessment & Plan Note (Signed)
-   Will continue her chronic pain medications

## 2023-05-20 NOTE — ED Notes (Signed)
Pt placed on 2 liters nc.

## 2023-05-20 NOTE — ED Notes (Signed)
 LR infusion verified with prev RN

## 2023-05-20 NOTE — Assessment & Plan Note (Signed)
-   Will continue Ativan

## 2023-05-20 NOTE — Assessment & Plan Note (Signed)
-   Will continue Armour Thyroid

## 2023-05-20 NOTE — Assessment & Plan Note (Addendum)
-   The patient will be admitted to a medical telemetry bed. - Will continue antibiotic therapy with IV Levaquin . - Mucolytic therapy be provided as well as duo nebs q.i.d. and q.4 hours p.r.n. - We will follow blood cultures. - Will be hydrated with IV lactated ringer . - Sepsis manifested by leukopenia and tachycardia with fever.

## 2023-05-21 DIAGNOSIS — E039 Hypothyroidism, unspecified: Secondary | ICD-10-CM | POA: Diagnosis not present

## 2023-05-21 DIAGNOSIS — J189 Pneumonia, unspecified organism: Secondary | ICD-10-CM | POA: Diagnosis not present

## 2023-05-21 DIAGNOSIS — F419 Anxiety disorder, unspecified: Secondary | ICD-10-CM | POA: Diagnosis not present

## 2023-05-21 DIAGNOSIS — G894 Chronic pain syndrome: Secondary | ICD-10-CM | POA: Diagnosis not present

## 2023-05-21 LAB — CBC WITH DIFFERENTIAL/PLATELET
Abs Immature Granulocytes: 0.01 10*3/uL (ref 0.00–0.07)
Basophils Absolute: 0 10*3/uL (ref 0.0–0.1)
Basophils Relative: 0 %
Eosinophils Absolute: 0 10*3/uL (ref 0.0–0.5)
Eosinophils Relative: 0 %
HCT: 36 % (ref 36.0–46.0)
Hemoglobin: 11.8 g/dL — ABNORMAL LOW (ref 12.0–15.0)
Immature Granulocytes: 0 %
Lymphocytes Relative: 39 %
Lymphs Abs: 1.5 10*3/uL (ref 0.7–4.0)
MCH: 29 pg (ref 26.0–34.0)
MCHC: 32.8 g/dL (ref 30.0–36.0)
MCV: 88.5 fL (ref 80.0–100.0)
Monocytes Absolute: 0.4 10*3/uL (ref 0.1–1.0)
Monocytes Relative: 9 %
Neutro Abs: 2 10*3/uL (ref 1.7–7.7)
Neutrophils Relative %: 52 %
Platelets: 177 10*3/uL (ref 150–400)
RBC: 4.07 MIL/uL (ref 3.87–5.11)
RDW: 12.3 % (ref 11.5–15.5)
WBC: 3.8 10*3/uL — ABNORMAL LOW (ref 4.0–10.5)
nRBC: 0 % (ref 0.0–0.2)

## 2023-05-21 LAB — COMPREHENSIVE METABOLIC PANEL WITH GFR
ALT: 63 U/L — ABNORMAL HIGH (ref 0–44)
AST: 34 U/L (ref 15–41)
Albumin: 3.2 g/dL — ABNORMAL LOW (ref 3.5–5.0)
Alkaline Phosphatase: 94 U/L (ref 38–126)
Anion gap: 10 (ref 5–15)
BUN: 9 mg/dL (ref 6–20)
CO2: 29 mmol/L (ref 22–32)
Calcium: 8.7 mg/dL — ABNORMAL LOW (ref 8.9–10.3)
Chloride: 99 mmol/L (ref 98–111)
Creatinine, Ser: 0.81 mg/dL (ref 0.44–1.00)
GFR, Estimated: >60 mL/min (ref >60)
Glucose, Bld: 113 mg/dL — ABNORMAL HIGH (ref 70–99)
Potassium: 3.5 mmol/L (ref 3.5–5.1)
Sodium: 138 mmol/L (ref 135–145)
Total Bilirubin: 0.5 mg/dL (ref 0.0–1.2)
Total Protein: 6.3 g/dL — ABNORMAL LOW (ref 6.5–8.1)

## 2023-05-21 LAB — MISC LABCORP TEST (SEND OUT): Labcorp test code: 83935

## 2023-05-21 MED ORDER — BENZONATATE 100 MG PO CAPS: 100.0000 mg | ORAL_CAPSULE | Freq: Three times a day (TID) | ORAL | 0 refills | Status: AC | PRN

## 2023-05-21 MED ORDER — LEVOFLOXACIN 750 MG PO TABS: 750.0000 mg | ORAL_TABLET | Freq: Every day | ORAL | 0 refills | Status: AC

## 2023-05-21 NOTE — Discharge Summary (Addendum)
 Physician Discharge Summary   Patient: Karen Aguilar MRN: [id number] DOB: 11-03-1965  Admit date:     05/19/2023  Discharge date: 05/21/23  Discharge Physician: Roise Cleaver   PCP: Claire Crick, MD   Recommendations at discharge:   Follow-up with primary care physician for chronic medication management  Discharge Diagnoses: Principal Problem:   Sepsis due to pneumonia [facility]) Active Problems:   Hypothyroidism   Anxiety   Chronic pain syndrome  Resolved Problems:   * No resolved hospital problems. [facility] Course: Karen Aguilar who carries diagnoses of depression, fibromyalgia, anxiety, chronic pain, dyslipidemia, IBS, diverticulosis, GERD, hypothyroidism, degenerative disc disease, osteoarthritis, and has 21 reported drug allergies.  She presents to the ED on this admission complaining of dyspnea, fever, and cough.  Patient reports she was recently seen for televisit and was prescribed doxycycline  which she has taken for 3 days prior to arrival.  She endorses loose stools secondary to the doxycycline . In the ED she was found to be febrile to 100.8, tachycardic to 103.  CBC revealed leukopenia of 3.9.  CMP with sodium 133, chloride 96.  Respiratory viral panel found to be negative.  Blood cultures were taken.  Chest x-ray revealed left upper lung pneumonia.  She was started on IV Levaquin  and admitted for observation. By reevaluation on 5/20 patient remained stable on room air.  She endorses feeling mildly improved.  Will need to continue her Levaquin  outpatient for 4 days.  Her clinical exam is reassuring and she is doing well with symptomatic support.  She will follow-up with her primary care physician to ensure resolution of infection and for chronic medication management.  Assessment and Plan: Left upper lung pneumonia, organism unspecified Sepsis: Tachycardia, leukopenia, fever. - Continue Levaquin  (has 21 drug allergies) x4 days to complete treatment - Symptomatic  support with DuoNebs and mucolytic therapies - Clinical exam reassuring - On room air.  Leukopenia - Likely secondary to sepsis as above. - HIV ordered but patient refused additional lab draw.  Does have history of HIV negative on the chart.  Highly recommend she follow-up with PCP for repeat testing given it has been 2 years since last test   Hypothyroid - Resume home meds   Anxiety Depression Fibromyalgia Chronic Fatigue - Resume all home medications.  Chronic Pain -On Nucynta .  Continue at home dose   Neurotrophic keratopath  - Resume all home eyedrops - Follow-up closely with outpatient ophthalmology   BMI 30 - Outpatient follow up for lifestyle modification and risk factor management  PDMP reviewed. Disposition: Home Diet recommendation:  Discharge Diet Orders (From admission, onward)     Start     Ordered   05/21/23 0000  Diet general        05/21/23 1428           Regular diet DISCHARGE MEDICATION: Allergies as of 05/21/2023       Reactions   Augmentin  [amoxicillin -pot Clavulanate] Swelling   Lip swelling and irritation.    Fluconazole  Swelling   Lips - swelling, bleeding, blisters, cracking, peeling   Erythromycin Nausea And Vomiting   Influenza Vaccines Hives, Swelling   Redness, heat, and extreme swelling at site   Levothyroxine  Hives   Thimerosal (thiomersal) Swelling   Local reaction-redness,swelling; veins popping   Tomato Other (See Comments)   Severe acid reflux with all acid foods   Atogepant Other (See Comments)   Gi symptoms   Atorvastatin  Other (See Comments)   GI upset, even once weekly dosing  Adhesive [tape] Rash   blisters   Azithromycin Rash   Gabapentin Other (See Comments)   Short term memory loss   Imitrex [sumatriptan] Other (See Comments)   Rebound migraine    Lyrica [pregabalin] Other (See Comments)   Short term memory loss   Macrodantin  [nitrofurantoin ] Rash   Macrolides And Ketolides Rash   Nickel Swelling, Rash    Sulfa Antibiotics Rash   Sulfasalazine Rash   Troleandomycin Rash   Vancomycin  Itching, Rash   Developed Redman's, may require premedication and infusion rate reduction in the future        Medication List     STOP taking these medications    doxycycline  100 MG tablet Commonly known as: VIBRA -TABS       TAKE these medications    albuterol  108 (90 Base) MCG/ACT inhaler Commonly known as: VENTOLIN  HFA Inhale 2 puffs into the lungs every 6 (six) hours as needed for wheezing or shortness of breath.   baclofen  10 MG tablet Commonly known as: LIORESAL  Take 0.5 tablets (5 mg total) by mouth 3 (three) times daily as needed.   benzonatate  100 MG capsule Commonly known as: Tessalon  Perles Take 1 capsule (100 mg total) by mouth 3 (three) times daily as needed for up to 7 days for cough.   calcium  carbonate 750 MG chewable tablet Commonly known as: TUMS EX Chew 2-3 tablets by mouth 2 (two) times daily as needed.   camphor-menthol  lotion Commonly known as: SARNA Apply 1 application topically at bedtime as needed for itching.   diclofenac  sodium 1 % Gel Commonly known as: VOLTAREN  APPLY THIN LAYER EXTERNALLY TO THE AFFECTED AREA UP TO FOUR TIMES DAILY AS NEEDED   docusate sodium  100 MG capsule Commonly known as: COLACE Take 100 mg by mouth 2 (two) times daily. As needed   EYE DROPS OP Apply to eye. PRGF eye drops. 1 drop 4-6 times a day in left eye   levofloxacin  750 MG tablet Commonly known as: Levaquin  Take 1 tablet (750 mg total) by mouth daily for 4 days.   LORazepam  1 MG tablet Commonly known as: ATIVAN  TAKE 1 TABLET BY MOUTH TWICE DAILY AS NEEDED FOR ANXIETY   loteprednol  0.5 % ophthalmic suspension Commonly known as: LOTEMAX  Take 1 drop both eyes:  -4x/day for 1 week, then  -3x/day for 1 week, then  -2x/day for 1 week, then  -1x/day and stay at 1x/day until seen again   NONFORMULARY OR COMPOUNDED ITEM Hydroeye supplement - with fish oil, vitamin C , A, E,  B6   Nucynta  ER 250 MG Tb12 Generic drug: Tapentadol  HCl SMARTSIG:1 Tablet(s) By Mouth Every 12 Hours   ondansetron  4 MG disintegrating tablet Commonly known as: ZOFRAN -ODT Take 1 tablet (4 mg total) by mouth every 8 (eight) hours as needed for nausea or vomiting.   tacrolimus  0.03 % ointment Commonly known as: PROTOPIC  Apply to eye. Custom compound   thyroid  90 MG tablet Commonly known as: ARMOUR Take 1 tablet (90 mg total) by mouth daily. One day a week take 1/2 tablet   Tyrvaya  0.03 MG/ACT Soln Generic drug: Varenicline  Tartrate Place 1 spray into both nostrils 2 (two) times daily.   VITAMIN C  PO Take 1 tablet by mouth as needed (for immune system support).   Vitamin D  50 MCG (2000 UT) Caps Take 1 capsule (2,000 Units total) by mouth daily.        Follow-up Information     Claire Crick, MD Follow up.   Specialty: Family Medicine  Why: hospital follow up Contact information: [address] [place] [address] [phone]                Discharge Exam: Cleavon Curls Weights   05/19/23 1420  Weight: 89.4 kg   Constitutional:  Normal appearance. Non toxic-appearing.  HENT: Head Normocephalic and atraumatic.  Mucous membranes are moist.  Eyes:  Extraocular intact. Conjunctivae normal. Pupils are equal, round, and reactive to light.  Cardiovascular: Rate and Rhythm: Normal rate and regular rhythm.  Pulmonary: Non labored, symmetric rise of chest wall.  Musculoskeletal:  Normal range of motion.  Skin: warm and dry. not jaundiced.  Neurological: No focal deficit present. alert. Oriented. Psychiatric: Mood and Affect congruent.   Condition at discharge: stable  The results of significant diagnostics from this hospitalization (including imaging, microbiology, ancillary and laboratory) are listed below for reference.   Imaging Studies: DG Chest 2 View Result Date: 05/19/2023 CLINICAL DATA:  sob EXAM: CHEST - 2 VIEW COMPARISON:  April 02, 2021  FINDINGS: The cardiomediastinal silhouette is unchanged in contour. No pleural effusion. No pneumothorax. There is a airspace opacity of the LEFT upper lung. Visualized abdomen is unremarkable. Spinal stimulator. IMPRESSION: Airspace opacity of the LEFT upper lung, favored pneumonia. Recommend follow-up PA and lateral chest radiograph in 4-6 weeks to assess for resolution. Electronically Signed   By: Clancy Crimes M.D.   On: 05/19/2023 15:08    Microbiology: Results for orders placed or performed during the hospital encounter of 05/19/23  Resp panel by RT-PCR (RSV, Flu A&B, Covid) Anterior Nasal Swab     Status: None   Collection Time: 05/19/23  3:29 PM   Specimen: Anterior Nasal Swab  Result Value Ref Range Status   SARS Coronavirus 2 by RT PCR NEGATIVE NEGATIVE Final    Comment: (NOTE) SARS-CoV-2 target nucleic acids are NOT DETECTED.  The SARS-CoV-2 RNA is generally detectable in upper respiratory specimens during the acute phase of infection. The lowest concentration of SARS-CoV-2 viral copies this assay can detect is 138 copies/mL. A negative result does not preclude SARS-Cov-2 infection and should not be used as the sole basis for treatment or other patient management decisions. A negative result may occur with  improper specimen collection/handling, submission of specimen other than nasopharyngeal swab, presence of viral mutation(s) within the areas targeted by this assay, and inadequate number of viral copies(<138 copies/mL). A negative result must be combined with clinical observations, patient history, and epidemiological information. The expected result is Negative.  Fact Sheet for Patients:  BloggerCourse.com  Fact Sheet for Healthcare Providers:  SeriousBroker.it  This test is no t yet approved or cleared by the [place]  FDA and  has been authorized for detection and/or diagnosis of SARS-CoV-2 by FDA under an  Emergency Use Authorization (EUA). This EUA will remain  in effect (meaning this test can be used) for the duration of the COVID-19 declaration under Section 564(b)(1) of the Act, 21 U.S.C.section 360bbb-3(b)(1), unless the authorization is terminated  or revoked sooner.       Influenza A by PCR NEGATIVE NEGATIVE Final   Influenza B by PCR NEGATIVE NEGATIVE Final    Comment: (NOTE) The Xpert Xpress SARS-CoV-2/FLU/RSV plus assay is intended as an aid in the diagnosis of influenza from Nasopharyngeal swab specimens and should not be used as a sole basis for treatment. Nasal washings and aspirates are unacceptable for Xpert Xpress SARS-CoV-2/FLU/RSV testing.  Fact Sheet for Patients: BloggerCourse.com  Fact Sheet for Healthcare Providers: SeriousBroker.it  This test  is not yet approved or cleared by the [place]  FDA and has been authorized for detection and/or diagnosis of SARS-CoV-2 by FDA under an Emergency Use Authorization (EUA). This EUA will remain in effect (meaning this test can be used) for the duration of the COVID-19 declaration under Section 564(b)(1) of the Act, 21 U.S.C. section 360bbb-3(b)(1), unless the authorization is terminated or revoked.     Resp Syncytial Virus by PCR NEGATIVE NEGATIVE Final    Comment: (NOTE) Fact Sheet for Patients: BloggerCourse.com  Fact Sheet for Healthcare Providers: SeriousBroker.it  This test is not yet approved or cleared by the [place]  FDA and has been authorized for detection and/or diagnosis of SARS-CoV-2 by FDA under an Emergency Use Authorization (EUA). This EUA will remain in effect (meaning this test can be used) for the duration of the COVID-19 declaration under Section 564(b)(1) of the Act, 21 U.S.C. section 360bbb-3(b)(1), unless the authorization is terminated or revoked.  Performed at [facility], [address] [place], [place] [address]   Blood Culture (routine x 2)     Status: None (Preliminary result)   Collection Time: 05/19/23  3:29 PM   Specimen: BLOOD  Result Value Ref Range Status   Specimen Description BLOOD BLOOD LEFT HAND  Final   Special Requests   Final    BOTTLES DRAWN AEROBIC AND ANAEROBIC Blood Culture results may not be optimal due to an inadequate volume of blood received in culture bottles   Culture   Final    NO GROWTH 2 DAYS Performed at [facility], [address] [place], [place] [address]    Report Status PENDING  Incomplete  Blood Culture (routine x 2)     Status: None (Preliminary result)   Collection Time: 05/19/23  3:29 PM   Specimen: BLOOD  Result Value Ref Range Status   Specimen Description BLOOD RIGHT ANTECUBITAL  Final   Special Requests   Final    BOTTLES DRAWN AEROBIC AND ANAEROBIC Blood Culture adequate volume   Culture   Final    NO GROWTH 2 DAYS Performed at [facility], [address] [place], [place] [address]    Report Status PENDING  Incomplete    Labs: CBC: Recent Labs  Lab 05/19/23 1433 05/20/23 0718 05/21/23 0812  WBC 3.9* 3.1* 3.8*  NEUTROABS  --   --  2.0  HGB 13.2 12.1 11.8*  HCT 39.1 36.4 36.0  MCV 89.7 88.8 88.5  PLT 192 178 177   Basic Metabolic Panel: Recent Labs  Lab 05/19/23 1433 05/20/23 0718 05/21/23 0812  NA 133* 138 138  K 3.5 3.9 3.5  CL 96* 101 99  CO2 27 32 29  GLUCOSE 126* 95 113*  BUN 9 8 9   CREATININE 0.84 0.78 0.81  CALCIUM  8.7* 8.5* 8.7*   Liver Function Tests: Recent Labs  Lab 05/21/23 0812  AST 34  ALT 63*  ALKPHOS 94  BILITOT 0.5  PROT 6.3*  ALBUMIN 3.2*   CBG: No results for input(s): "GLUCAP" in the last 168 hours.  Discharge time spent: 32 minutes.  Signed: Wynette Jersey, DO [facility] 05/21/2023

## 2023-05-21 NOTE — Progress Notes (Signed)
 Notified by tele that pt had a 4 beat run of nonsustained vtach. Pt asymptomatic & in bed asleep. Dr Achilles Holes notified. No new orders.

## 2023-05-21 NOTE — Plan of Care (Signed)

## 2023-05-22 ENCOUNTER — Telehealth: Payer: Self-pay

## 2023-05-22 MED ORDER — ONDANSETRON 4 MG PO TBDP: 4.0000 mg | ORAL_TABLET | Freq: Three times a day (TID) | ORAL | 0 refills | Status: AC | PRN

## 2023-05-22 NOTE — Addendum Note (Signed)
 Addended by: Claire Crick on: 05/22/2023 05:30 PM   Modules accepted: Orders

## 2023-05-22 NOTE — Patient Instructions (Signed)
 Visit Information  Thank you for taking time to visit with me today. Please don't hesitate to contact me if I can be of assistance to you before our next scheduled telephone appointment.  Our next appointment is by telephone on May 28, 2023 at 1:15 pm  Following is a copy of your care plan:   Goals Addressed   None     Patient verbalizes understanding of instructions and care plan provided today and agrees to view in MyChart. Active MyChart status and patient understanding of how to access instructions and care plan via MyChart confirmed with patient.     Telephone follow up appointment with care management team member scheduled for: The patient has been provided with contact information for the care management team and has been advised to call with any health related questions or concerns.  Follow up with provider re: ongoing nose bleed  Please call the care guide team at [phone] if you need to cancel or reschedule your appointment.   Please call the [place]  National Suicide Prevention Lifeline: 908 234 9709 or TTY: (314)457-0237 TTY 7803456574) to talk to a trained counselor if you are experiencing a Mental Health or Behavioral Health Crisis or need someone to talk to. Brown Cape, RN, BSN, CCM [facility], [facility] Health RN Care Manager Direct Dial: [phone]

## 2023-05-22 NOTE — Transitions of Care (Post Inpatient/ED Visit) (Addendum)
 05/23/2023  Name: Karen Aguilar MRN: [id number] DOB: 12/24/1965  Today's TOC FU Call Status: Today's TOC FU Call Status:: Successful TOC FU Call Completed TOC FU Call Complete Date: 05/22/23 Patient's Name and Date of Birth confirmed.  Transition Care Management Follow-up Telephone Call Date of Discharge: 05/21/23 Discharge Facility: [facility]) Type of Discharge: Inpatient Admission Primary Inpatient Discharge Diagnosis:: Sepsis due to pneumonia How have you been since you were released from the hospital?: Same Any questions or concerns?: Yes Patient Questions/Concerns:: Question regrading medications needs Zofran  refill and information regarding Tessionex not given post hospital  Items Reviewed: Did you receive and understand the discharge instructions provided?: Yes Medications obtained,verified, and reconciled?: Yes (Medications Reviewed) Any new allergies since your discharge?: No Dietary orders reviewed?: Yes Type of Diet Ordered:: Diet as tolerated Do you have support at home?: Yes People in Home [RPT]: spouse Name of Support/Comfort Primary Source: Lovena Rubinstein  Medications Reviewed Today: Medications Reviewed Today     Reviewed by Jamie Mccoy, RN (Registered Nurse) on 05/22/23 at 1708  Med List Status: <None>   Medication Order Taking? Sig Documenting Provider Last Dose Status Informant  albuterol  (VENTOLIN  HFA) 108 (90 Base) MCG/ACT inhaler [id number] Yes Inhale 2 puffs into the lungs every 6 (six) hours as needed for wheezing or shortness of breath. Mardene Shake, FNP Taking Active Self  Ascorbic Acid  (VITAMIN C  PO) [id number] Yes Take 1 tablet by mouth as needed (for immune system support).  [provider] Taking Active Self           Med Note Nicholette Barley, Winda Hastings Aug 15, 2015  6:51 PM)    baclofen  (LIORESAL ) 10 MG tablet [id number]  Take 0.5 tablets (5 mg total) by mouth 3 (three) times daily as needed. Donnie Galea, MD  Active  Self  benzonatate  (TESSALON  PERLES) 100 MG capsule [id number] Yes Take 1 capsule (100 mg total) by mouth 3 (three) times daily as needed for up to 7 days for cough. Dezii, Alexandra, DO Taking Active            Med Note Miriam American, Rowan Cooter   Wed May 22, 2023  4:20 PM) States it's not helping  calcium  carbonate (TUMS EX) 750 MG chewable tablet [id number] No Chew 2-3 tablets by mouth 2 (two) times daily as needed.  Patient not taking: Reported on 05/22/2023   [provider] Not Taking Active Self           Med Note Miriam American, Aundra Lee May 22, 2023  4:21 PM) Patient not taking with antibiotics and with having nausea  camphor-menthol  (SARNA) lotion [id number]  Apply 1 application topically at bedtime as needed for itching.  [provider]  Active Self  Carboxymethylcellulose Sodium (EYE DROPS OP) [id number]  Apply to eye. PRGF eye drops. 1 drop 4-6 times a day in left eye [provider]  Active Self  Cholecalciferol  (VITAMIN D ) 2000 units CAPS [id number]  Take 1 capsule (2,000 Units total) by mouth daily.  Patient not taking: Reported on 05/19/2023   Claire Crick, MD  Active Self  diclofenac  sodium (VOLTAREN ) 1 % GEL [id number]  APPLY THIN LAYER EXTERNALLY TO THE AFFECTED AREA UP TO FOUR TIMES DAILY AS NEEDED Arnie Lao, MD  Active Self           Med Note Miriam American, Aundra Lee May 22, 2023  4:24 PM) As needed  docusate sodium  (COLACE) 100 MG  capsule [id number]  Take 100 mg by mouth 2 (two) times daily. As needed [provider]  Active Self           Med Note Miriam American, Aundra Lee May 22, 2023  4:23 PM) Only as needed  levofloxacin  (LEVAQUIN ) 750 MG tablet [id number] Yes Take 1 tablet (750 mg total) by mouth daily for 4 days. Dezii, Alexandra, DO Taking Active   LORazepam  (ATIVAN ) 1 MG tablet [id number] Yes TAKE 1 TABLET BY MOUTH TWICE DAILY AS NEEDED FOR ANXIETY Claire Crick, MD Taking Active Self           Med Note Miriam American,  Aundra Lee May 22, 2023  5:07 PM) As needed  loteprednol  (LOTEMAX ) 0.5 % ophthalmic suspension [id number]  Take 1 drop both eyes:  -4x/day for 1 week, then  -3x/day for 1 week, then  -2x/day for 1 week, then  -1x/day and stay at 1x/day until seen again  Patient not taking: Reported on 05/19/2023   [provider]  Active Self  NONFORMULARY OR COMPOUNDED ITEM [id number]  Hydroeye supplement - with fish oil, vitamin C , A, E, B6 Claire Crick, MD  Active Self  NUCYNTA  ER 250 MG TB12 [id number] Yes SMARTSIG:1 Tablet(s) By Mouth Every 12 Hours [provider] Taking Active Self  ondansetron  (ZOFRAN -ODT) 4 MG disintegrating tablet [id number] Yes Take 1 tablet (4 mg total) by mouth every 8 (eight) hours as needed for nausea or vomiting. Maggie Schooner, MD Taking Active Self           Med Note Miriam American, Aundra Lee May 22, 2023  5:07 PM) Patient states she needs PCP to order refill  tacrolimus  (PROTOPIC ) 0.03 % ointment [id number]  Apply to eye. Custom compound [provider]  Active Self  thyroid  (ARMOUR) 90 MG tablet [id number] Yes Take 1 tablet (90 mg total) by mouth daily. One day a week take 1/2 tablet Claire Crick, MD Taking Active Self  TYRVAYA  0.03 MG/ACT SOLN [id number] Yes Place 1 spray into both nostrils 2 (two) times daily. [provider] Taking Active Self           Med Note Miriam American, Aundra Lee May 22, 2023  4:22 PM) Patient experiencing nose bleed and pausing this for now            Goals Addressed             This Visit's Progress    [facility] Transitions of Care (TOC) Care Plan       Problems:  Recent Hospitalization for treatment of Pulmonary Disease and Sepsis due to pneumonia No Hospital Follow Up Provider appointment sent request to [facility] admin  Goal:  Over the next 30 days, the patient will not experience hospital readmission  Interventions:  Transitions of Care: Follow up needed request within 14 day range Doctor Visits   - discussed the importance of doctor visits Contacted provider for patient needs request for Zofran  refill and Tussionex for cough via in basket   Patient Self Care Activities:  Attend all scheduled provider appointments Call pharmacy for medication refills 3-7 days in advance of running out of medications Call provider office for new concerns or questions   Plan:  The care management team will reach out to the patient again over the next 5- 10 days. Will follow with regarding TOC appointment tomorrow.         PCP Follow-up appointment confirmed?: No (Needed a routine  f/u 06/11/23 - RN request earlier date via pool message) Do you need transportation to your follow-up appointment?: No Do you understand care options if your condition(s) worsen?: No-reviewed care options from AVS  SDOH Interventions Today    Flowsheet Row Most Recent Value  SDOH Interventions   Food Insecurity Interventions Intervention Not Indicated  Housing Interventions Intervention Not Indicated  Transportation Interventions Intervention Not Indicated  Utilities Interventions Intervention Not Indicated       [place], RN, BSN, CCM [place]  [facility], [facility] RN Care Manager Direct Dial: [phone]

## 2023-05-23 ENCOUNTER — Telehealth: Payer: Self-pay

## 2023-05-23 NOTE — Transitions of Care (Post Inpatient/ED Visit) (Signed)
   05/23/2023  Name: Karen Aguilar MRN: [id number] DOB: 04-13-1965  Today's TOC FU Call Status:   Patient's Name and Date of Birth confirmed.  Transition Care Management Follow-up Telephone Call      Follow up appointments reviewed:  PCP follow up needed and [facility] CMA states May 31, 2023 at 11:15 am     Brown Cape, RN, Scientist, research (physical sciences), CCM [facility], [facility] Health RN Care Manager Direct Dial: [phone]

## 2023-05-24 LAB — CULTURE, BLOOD (ROUTINE X 2)
Culture: NO GROWTH
Culture: NO GROWTH
Special Requests: ADEQUATE

## 2023-05-24 MED ORDER — HYDROCOD POLI-CHLORPHE POLI ER 10-8 MG/5ML PO SUER: 5.0000 mL | Freq: Two times a day (BID) | ORAL | 0 refills | Status: DC | PRN

## 2023-05-24 NOTE — Telephone Encounter (Signed)
Spoke with pt relaying Dr. G's message. Pt verbalizes understanding.  

## 2023-05-24 NOTE — Telephone Encounter (Addendum)
 Noted.  Zofran  and Tussionex refilled per TCM phone call. Caution with tussionex use with Nucynta .  For nosebleeds, to let us  know if recurrent/ongoing. Recommend frequent nasal saline irrigation.

## 2023-05-24 NOTE — Addendum Note (Signed)
 Addended by: Claire Crick on: 05/24/2023 06:01 AM   Modules accepted: Orders

## 2023-05-28 ENCOUNTER — Other Ambulatory Visit: Payer: Self-pay

## 2023-05-28 NOTE — Progress Notes (Signed)
 Safety tub

## 2023-05-28 NOTE — Patient Instructions (Signed)
 Visit Information  Thank you for taking time to visit with me today. Please don't hesitate to contact me if I can be of assistance to you before our next scheduled telephone appointment.  Our next appointment is by telephone on June 06, 2023 at 10:30 am  Following is a copy of your care plan:     Patient verbalizes understanding of instructions and care plan provided today and agrees to view in MyChart. Active MyChart status and patient understanding of how to access instructions and care plan via MyChart confirmed with patient.     Telephone follow up appointment with care management team member scheduled for: The patient has been provided with contact information for the care management team and has been advised to call with any health related questions or concerns.  The patient will call provider with worsening respiratory symptoms* as advised to continue regimen for cough and nausea.  To avoid dehydration continue to add fluids and potential supplements slowly.  Patient states she's been doing the [employer] and tolerating, so far, with less nausea.  Please call the care guide team at [phone] if you need to cancel or reschedule your appointment.   Please call the [place]  National Suicide Prevention Lifeline: 216-551-3034 or TTY: 5195493627 TTY 917-767-6248) to talk to a trained counselor call 1-800-273-TALK (toll free, 24 hour hotline) if you are experiencing a Mental Health or Behavioral Health Crisis or need someone to talk to.  Brown Cape, RN, BSN, CCM [facility], [facility] RN Care Manager Direct Dial: [phone]

## 2023-05-28 NOTE — Transitions of Care (Post Inpatient/ED Visit) (Signed)
 Transition of Care week 2  Visit Note  05/28/2023  Name: Karen Aguilar MRN: [id number]          DOB: 05/24/1965  Situation: Patient enrolled in [facility] 30-day program. Visit completed with patient by telephone.   Background:   Initial Transition Care Management Follow-up Telephone Call Date of Discharge: 05/21/23 Discharge Facility: [facility]) Primary Inpatient Discharge Diagnosis:: sepsis How have you been since you were released from the hospital?: Better Patient Questions/Concerns:: Tessionex was given and stopped after cough  Past Medical History:  Diagnosis Date   Abnormal MRI scan, bone 2012   abnormal marrow signal humeral head, glenoid and scapula - diffuse replacement of fatty marrow - likely benign, no need for further investigation ([place])   Agnosia for temperature    Right Leg   Anemia    etiology unknown-iron infusion in past,last 9/12   Anxiety    Anxiety and depression    Arachnoiditis    S1 nerve root, 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 Marcelino Sera [facility]   COVID-19 virus infection 09/2018   DDD (degenerative disc disease), cervical    DDD (degenerative disc disease), lumbar    s/p permanent nerve damage after back surgery   Depression    Depression with anxiety    Diverticulosis    Eczema    Family history of adverse reaction to anesthesia    Mother - nausea   Fibromyalgia    Foot drop    right - numbness, tingling - twisting   Gallstones    GERD (gastroesophageal reflux disease)    rare zantac  PRN N/V at times with this   Headache(784.0)    migraines   Hemorrhoid    Hemorrhoids    History of kidney stones    Hyperlipidemia    Hypothyroidism 1990s   hashimoto's thyroiditis   IBS (irritable bowel syndrome)    IBS (irritable bowel syndrome)    Incontinence of urine    s/p sling procedure-unresolved   Iron deficiency    h/o anemia, s/p iron infusion (2012)   Liver hemangioma     x2    Memory change    due to medications   Migraines    and h/o optical migraine   Migraines    Mixed urge and stress incontinence 2008   extensive workup including urodynamics, failed miltiple anticholinergics including myrbetriq and urogesic blue s/p PTNS, normal cystoscopy 2014, seen by Dr Clarke Crouch and Dr Ace Holder   Neurogenic bladder 2008   after 13 spine surgeries with periph neuropathy and chronic LBP   Neuropathy of lower extremity 2011   Nocturia    Notalgia paresthetica    Osteoarthritis 12/2013   PONV (postoperative nausea and vomiting)    Pulmonary nodule 04/2012   7.35mm RLL nodule - resolved on imaging 12/2012   Sleep apnea    mild-doesn't use cpap   Urine frequency    Vitamin D  deficiency     Assessment: Patient Reported Symptoms: Cognitive Cognitive Status: Alert and oriented to person, place, and time      Neurological Neurological Review of Symptoms: Dizziness, Weakness Neurological Self-Management Outcome: 2 (bad)  HEENT HEENT Symptoms Reported: No symptoms reported HEENT Self-Management Outcome: 3 (uncertain) HEENT Comment: better    Cardiovascular Cardiovascular Symptoms Reported: No symptoms reported Does patient have uncontrolled Hypertension?: No    Respiratory Respiratory Symptoms Reported: Dry cough Other Respiratory Symptoms: Not as harsh and used Tussionex for a  couple of days    Endocrine Patient reports the following symptoms related to hypoglycemia or hyperglycemia : No symptoms reported Is patient diabetic?: No    Gastrointestinal Gastrointestinal Symptoms Reported: No symptoms reported Additional Gastrointestinal Details: uses Miralax and Colace - feels better Gastrointestinal Self-Management Outcome: 4 (good)    Genitourinary Genitourinary Symptoms Reported: No symptoms reported    Integumentary Integumentary Symptoms Reported: No symptoms reported    Musculoskeletal Musculoskelatal Symptoms Reviewed: Weakness, Unsteady gait, Difficulty  walking Additional Musculoskeletal Details: walks with straight cane Musculoskeletal Self-Management Outcome: 2 (bad)      Psychosocial Psychosocial Symptoms Reported: Anxiety - if selected complete GAD, Depression - if selected complete PHQ 2-9 Behavioral Health Conditions: Anxiety, Depression       Vitals:   05/28/23 1315  BP: 114/79  Pulse: 83    Medications Reviewed Today     Reviewed by Jamie Mccoy, RN (Registered Nurse) on 05/28/23 at 1327  Med List Status: <None>   Medication Order Taking? Sig Documenting Provider Last Dose Status Informant  albuterol  (VENTOLIN  HFA) 108 (90 Base) MCG/ACT inhaler [id number] No Inhale 2 puffs into the lungs every 6 (six) hours as needed for wheezing or shortness of breath. Mardene Shake, FNP Taking Active Self  Ascorbic Acid  (VITAMIN C  PO) [id number] No Take 1 tablet by mouth as needed (for immune system support).  [provider] Taking Active Self           Med Note Nicholette Barley, Winda Hastings Aug 15, 2015  6:51 PM)    baclofen  (LIORESAL ) 10 MG tablet [id number] No Take 0.5 tablets (5 mg total) by mouth 3 (three) times daily as needed. Donnie Galea, MD 05/19/2023  9:00 PM Active Self  benzonatate  (TESSALON  PERLES) 100 MG capsule [id number] No Take 1 capsule (100 mg total) by mouth 3 (three) times daily as needed for up to 7 days for cough. Dezii, Alexandra, DO Taking Active            Med Note Miriam American, Rowan Cooter   Wed May 22, 2023  4:20 PM) States it's not helping  calcium  carbonate (TUMS EX) 750 MG chewable tablet [id number] No Chew 2-3 tablets by mouth 2 (two) times daily as needed.  Patient not taking: Reported on 05/22/2023   [provider] Not Taking Active Self           Med Note Miriam American, Aundra Lee May 22, 2023  4:21 PM) Patient not taking with antibiotics and with having nausea  camphor-menthol  (SARNA) lotion [id number] No Apply 1 application topically at bedtime as needed for itching.  [provider]  Unknown Active Self  Carboxymethylcellulose Sodium (EYE DROPS OP) [id number] No Apply to eye. PRGF eye drops. 1 drop 4-6 times a day in left eye [provider] 05/19/2023 Active Self  chlorpheniramine-HYDROcodone  (TUSSIONEX) 10-8 MG/5ML [id number]  Take 5 mLs by mouth every 12 (twelve) hours as needed for cough. Claire Crick, MD  Active   Cholecalciferol  (VITAMIN D ) 2000 units CAPS [id number] No Take 1 capsule (2,000 Units total) by mouth daily.  Patient not taking: Reported on 05/19/2023   Claire Crick, MD Not Taking Active Self  diclofenac  sodium (VOLTAREN ) 1 % GEL [id number] No APPLY THIN LAYER EXTERNALLY TO THE AFFECTED AREA UP TO FOUR TIMES DAILY AS NEEDED Arnie Lao, MD Unknown Active Self           Med Note Miriam American, Aundra Lee May 22, 2023  4:24 PM)  As needed  docusate sodium  (COLACE) 100 MG capsule [id number] No Take 100 mg by mouth 2 (two) times daily. As needed [provider] Unknown Active Self           Med Note Miriam American, Aundra Lee May 22, 2023  4:23 PM) Only as needed  LORazepam  (ATIVAN ) 1 MG tablet [id number] No TAKE 1 TABLET BY MOUTH TWICE DAILY AS NEEDED FOR ANXIETY Claire Crick, MD Taking Active Self           Med Note Miriam American, Aundra Lee May 22, 2023  5:07 PM) As needed  loteprednol  (LOTEMAX ) 0.5 % ophthalmic suspension [id number] No Take 1 drop both eyes:  -4x/day for 1 week, then  -3x/day for 1 week, then  -2x/day for 1 week, then  -1x/day and stay at 1x/day until seen again  Patient not taking: Reported on 05/19/2023   [provider] Not Taking Active Self  NONFORMULARY OR COMPOUNDED ITEM [id number] No Hydroeye supplement - with fish oil, vitamin C , A, E, B6 Claire Crick, MD Taking Active Self  NUCYNTA  ER 250 MG TB12 [id number] No SMARTSIG:1 Tablet(s) By Mouth Every 12 Hours [provider] Taking Active Self  ondansetron  (ZOFRAN -ODT) 4 MG disintegrating tablet [id number]  Take 1 tablet (4 mg total)  by mouth every 8 (eight) hours as needed for nausea or vomiting. Claire Crick, MD  Active   tacrolimus  (PROTOPIC ) 0.03 % ointment [id number] No Apply to eye. Custom compound [provider] 05/18/2023 Evening Active Self  thyroid  (ARMOUR) 90 MG tablet [id number] No Take 1 tablet (90 mg total) by mouth daily. One day a week take 1/2 tablet Claire Crick, MD Taking Active Self  TYRVAYA  0.03 MG/ACT SOLN [id number] No Place 1 spray into both nostrils 2 (two) times daily. [provider] Taking Active Self           Med Note Miriam American, Aundra Lee May 22, 2023  4:22 PM) Patient experiencing nose bleed and pausing this for now            Goals Addressed             This Visit's Progress    VBCI Transitions of Care (TOC) Care Plan       Problems:  Recent Hospitalization for treatment of Pulmonary Disease and Sepsis due to pneumonia Follow up with PCP for [facility] on May 31, 2023  Goal:  Over the next 30 days, the patient will not experience hospital readmission  Interventions:  Transitions of Care: Follow up needed request within 14 day range Continue to increase diet with supplements as tolerate Contact PCP for questions and if symptoms worsen  Patient Self Care Activities:  Attend all scheduled provider appointments Call pharmacy for medication refills 3-7 days in advance of running out of medications Call provider office for new concerns or questions  Remember to get up slowly and use assistive devise when navigating at home  Plan:  The care management team will reach out to the patient again over the next 7-10 days. Continue to increase eating and drinking as tolerated to avoid dehydration Speak with your PCP regarding any increase feelings of depression or anxiety and for possible referral for counseling needs.       Brown Cape, RN, BSN, CCM [facility], [facility] RN Care Manager Direct Dial: [phone]

## 2023-05-31 ENCOUNTER — Ambulatory Visit: Admitting: Family Medicine

## 2023-05-31 ENCOUNTER — Encounter: Payer: Self-pay | Admitting: Family Medicine

## 2023-05-31 VITALS — BP 118/84 | HR 102 | Temp 98.5°F | Ht 67.0 in | Wt 195.1 lb

## 2023-05-31 DIAGNOSIS — G4734 Idiopathic sleep related nonobstructive alveolar hypoventilation: Secondary | ICD-10-CM | POA: Diagnosis not present

## 2023-05-31 DIAGNOSIS — J189 Pneumonia, unspecified organism: Secondary | ICD-10-CM | POA: Diagnosis not present

## 2023-05-31 DIAGNOSIS — A419 Sepsis, unspecified organism: Secondary | ICD-10-CM | POA: Diagnosis not present

## 2023-05-31 DIAGNOSIS — G4733 Obstructive sleep apnea (adult) (pediatric): Secondary | ICD-10-CM | POA: Diagnosis not present

## 2023-05-31 DIAGNOSIS — R1909 Other intra-abdominal and pelvic swelling, mass and lump: Secondary | ICD-10-CM | POA: Diagnosis not present

## 2023-05-31 NOTE — Progress Notes (Signed)
 Ph: [phone] Fax: [phone]   Patient ID: Karen Aguilar, female    DOB: 09-26-1965, 58 y.o.   MRN: [id number]  This visit was conducted in person.  BP 118/84   Pulse (!) 102   Temp 98.5 F (36.9 C) (Oral)   Ht 5\' 7"  (1.702 m)   Wt 195 lb 2 oz (88.5 kg)   LMP 10/04/2014 Comment: per labs  SpO2 96%   BMI 30.56 kg/m    CC: hosp f/u visit  Subjective:   HPI: Karen Aguilar is a 58 y.o. female presenting on 05/31/2023 for Hospitalization Follow-up (Admitted on 05/19/23 at [facility], dx sepsis due to PNA. )   Recent hospitalization for dyspnea, fever, cough despite outpatient treatment with doxycycline  course (telehealth). Doxy caused loose stools, nausea, GI upset. Respiratory viral panel negative, blood cultures also NG - final. CXR showed LUL CAP. Treated with IV levaquin  which was transitioned to oral levaquin  750mg  to complete 7d course. Also treated with IVF.  Hospital records reviewed. Med rec performed.  She thinks levaquin  caused nausea.   She stopped tacrolimus  (Protopic ) eye drops due to concern over immunosuppression contributing to recent PNA. However notes this was helpful for eye symptoms. Possible sjogren's disease to eyes. Regularly sees [facility] eye doctor, has seen rheumatology as well.   Recent HST reviewed as per below,  HST 05/2023: mild OSA AHI 13.2 (33% central), RDI 13.2, hypoxia to 71% with 8% sleep time spent <88%.  H/o mild OSA in her 6s - intolerant to previous trials of CPAP masks including nasal pillow due to worsening of migraines. Saw Dr Meredeth Stallion in [place].   Longstanding lump to soft tissue of abdomen below L ribcage - notes new lumps to abdominal skin below ribcage bilaterally. Presumed lipomas. Rheum recommended visualization with US .   Home health not set up.  Other follow up appointments scheduled: none ______________________________________________________________________ Hospital admission: 05/19/2023 Hospital discharge: 05/21/2023 TCM f/u  phone call:  performed on 05/23/2023  Recommendations at discharge:  Follow-up with primary care physician for chronic medication management   Discharge Diagnoses: Principal Problem:   Sepsis due to pneumonia [facility]) Active Problems:   Hypothyroidism   Anxiety   Chronic pain syndrome     Relevant past medical, surgical, family and social history reviewed and updated as indicated. Interim medical history since our last visit reviewed. Allergies and medications reviewed and updated. Outpatient Medications Prior to Visit  Medication Sig Dispense Refill   Ascorbic Acid  (VITAMIN C  PO) Take 1 tablet by mouth as needed (for immune system support).      baclofen  (LIORESAL ) 10 MG tablet Take 0.5 tablets (5 mg total) by mouth 3 (three) times daily as needed.     calcium  carbonate (TUMS EX) 750 MG chewable tablet Chew 2-3 tablets by mouth 2 (two) times daily as needed.     camphor-menthol  (SARNA) lotion Apply 1 application topically at bedtime as needed for itching.      Carboxymethylcellulose Sodium (EYE DROPS OP) Apply to eye. PRGF eye drops. 1 drop 4-6 times a day in left eye     Cholecalciferol  (VITAMIN D ) 2000 units CAPS Take 1 capsule (2,000 Units total) by mouth daily. 30 capsule    diclofenac  sodium (VOLTAREN ) 1 % GEL APPLY THIN LAYER EXTERNALLY TO THE AFFECTED AREA UP TO FOUR TIMES DAILY AS NEEDED 300 g 0   docusate sodium  (COLACE) 100 MG capsule Take 100 mg by mouth 2 (two) times daily. As needed     LORazepam  (ATIVAN )  1 MG tablet TAKE 1 TABLET BY MOUTH TWICE DAILY AS NEEDED FOR ANXIETY 60 tablet 0   NUCYNTA  ER 250 MG TB12 SMARTSIG:1 Tablet(s) By Mouth Every 12 Hours     ondansetron  (ZOFRAN -ODT) 4 MG disintegrating tablet Take 1 tablet (4 mg total) by mouth every 8 (eight) hours as needed for nausea or vomiting. 20 tablet 0   tacrolimus  (PROTOPIC ) 0.03 % ointment Apply to eye. Custom compound     thyroid  (ARMOUR) 90 MG tablet Take 1 tablet (90 mg total) by mouth daily. One day a week take  1/2 tablet     TYRVAYA  0.03 MG/ACT SOLN Place 1 spray into both nostrils 2 (two) times daily.     albuterol  (VENTOLIN  HFA) 108 (90 Base) MCG/ACT inhaler Inhale 2 puffs into the lungs every 6 (six) hours as needed for wheezing or shortness of breath. 8 g 0   chlorpheniramine-HYDROcodone  (TUSSIONEX) 10-8 MG/5ML Take 5 mLs by mouth every 12 (twelve) hours as needed for cough. 120 mL 0   loteprednol  (LOTEMAX ) 0.5 % ophthalmic suspension Take 1 drop both eyes:  -4x/day for 1 week, then  -3x/day for 1 week, then  -2x/day for 1 week, then  -1x/day and stay at 1x/day until seen again (Patient not taking: Reported on 05/19/2023)     NONFORMULARY OR COMPOUNDED ITEM Hydroeye supplement - with fish oil, vitamin C , A, E, B6     No facility-administered medications prior to visit.     Per HPI unless specifically indicated in ROS section below Review of Systems  Objective:  BP 118/84   Pulse (!) 102   Temp 98.5 F (36.9 C) (Oral)   Ht 5\' 7"  (1.702 m)   Wt 195 lb 2 oz (88.5 kg)   LMP 10/04/2014 Comment: per labs  SpO2 96%   BMI 30.56 kg/m   Wt Readings from Last 3 Encounters:  05/31/23 195 lb 2 oz (88.5 kg)  05/19/23 197 lb (89.4 kg)  04/12/23 196 lb 2 oz (89 kg)      Physical Exam Vitals and nursing note reviewed.  Constitutional:      Appearance: Normal appearance. She is not ill-appearing.  HENT:     Head: Normocephalic and atraumatic.     Mouth/Throat:     Mouth: Mucous membranes are moist.     Pharynx: Oropharynx is clear. No oropharyngeal exudate or posterior oropharyngeal erythema.  Eyes:     Extraocular Movements: Extraocular movements intact.     Conjunctiva/sclera: Conjunctivae normal.     Pupils: Pupils are equal, round, and reactive to light.  Cardiovascular:     Rate and Rhythm: Normal rate and regular rhythm.     Pulses: Normal pulses.     Heart sounds: Normal heart sounds. No murmur heard. Pulmonary:     Effort: Pulmonary effort is normal. No respiratory distress.      Breath sounds: Normal breath sounds. No wheezing, rhonchi or rales.  Abdominal:     General: There is no distension.     Palpations: Abdomen is soft. There is mass.     Tenderness: There is no abdominal tenderness. There is no guarding or rebound.     Hernia: No hernia is present.       Comments: Soft tissue masses to upper abdomen, with more prominent ~2cm diameter circumscribed rubbery lesion below L ribcage  Musculoskeletal:     Right lower leg: No edema.     Left lower leg: No edema.  Skin:    General: Skin is warm  and dry.     Findings: No erythema or rash.  Neurological:     Mental Status: She is alert.  Psychiatric:        Mood and Affect: Mood normal.        Behavior: Behavior normal.       Lab Results  Component Value Date   NA 138 05/21/2023   CL 99 05/21/2023   K 3.5 05/21/2023   CO2 29 05/21/2023   BUN 9 05/21/2023   CREATININE 0.81 05/21/2023   GFRNONAA >60 05/21/2023   CALCIUM  8.7 (L) 05/21/2023   ALBUMIN 3.2 (L) 05/21/2023   GLUCOSE 113 (H) 05/21/2023   Lab Results  Component Value Date   WBC 3.8 (L) 05/21/2023   HGB 11.8 (L) 05/21/2023   HCT 36.0 05/21/2023   MCV 88.5 05/21/2023   PLT 177 05/21/2023     Assessment & Plan:   Problem List Items Addressed This Visit     OSA (obstructive sleep apnea)   Mild on latest HST 05/2023 as per above - with significant proportion of central sleep apneas (33%). Will refer to Dr Kieran Pellet sleep medicine at [facility] pulmonology.       Relevant Orders   Ambulatory referral to Pulmonology   Sepsis due to pneumonia [facility]) - Primary   Sepsis has resolved. This was attributed to LUL CAP, treated with levaquin  course. Had some trouble tolerating levaquin  due to GI upset and nausea.       Nocturnal hypoxia   Noted on recent sleep study (hypoxia to 71% with 8% sleep time spent at <88% O2 saturation) - will refer to Dr Kieran Pellet sleep medicine at [facility] pulmonology.       Relevant Orders   Ambulatory referral to Pulmonology    Abdominal lump   Presumed lipoma. Further evaluate with limited abdominal soft tissue ultrasound.       Relevant Orders   US  Abdomen Limited   Left upper lobe pneumonia   Will order f/u CXR in 1 month to ensure resolution of pneumonia         No orders of the defined types were placed in this encounter.   Orders Placed This Encounter  Procedures   US  Abdomen Limited    Standing Status:   Future    Expiration Date:   05/30/2024    Reason for Exam (SYMPTOM  OR DIAGNOSIS REQUIRED):   eval soft tissue masses L>R upper abdomen r/o lipoma    Preferred imaging location?:   [facility]   Ambulatory referral to Pulmonology    Referral Priority:   Routine    Referral Type:   Consultation    Referral Reason:   Specialty Services Required    Requested Specialty:   Pulmonary Disease    Number of Visits Requested:   1    Patient Instructions  I'm glad you're doing better from recent pneumonia standpoint.  I'd like to refer you to the sleep doctor (Dr Kieran Pellet at [facility] pulmonology).  Return in 1 month (between 10-12 or 2-4pm) for repeat chest xray.  Ok to cancel June 10th appt. Reschedule appointment to 2 months from now.  I will order abdominal wall ultrasound to be done in [place].   Follow up plan: Return in about 2 months (around 07/31/2023) for follow up visit.  Claire Crick, MD

## 2023-05-31 NOTE — Patient Instructions (Addendum)
 I'm glad you're doing better from recent pneumonia standpoint.  I'd like to refer you to the sleep doctor (Dr Kieran Pellet at [facility] pulmonology).  Return in 1 month (between 10-12 or 2-4pm) for repeat chest xray.  Ok to cancel June 10th appt. Reschedule appointment to 2 months from now.  I will order abdominal wall ultrasound to be done in [place].

## 2023-05-31 NOTE — Assessment & Plan Note (Addendum)
 Sepsis has resolved. This was attributed to LUL CAP, treated with levaquin  course. Had some trouble tolerating levaquin  due to GI upset and nausea.

## 2023-06-02 ENCOUNTER — Encounter: Payer: Self-pay | Admitting: Family Medicine

## 2023-06-02 DIAGNOSIS — D179 Benign lipomatous neoplasm, unspecified: Secondary | ICD-10-CM | POA: Insufficient documentation

## 2023-06-02 DIAGNOSIS — J189 Pneumonia, unspecified organism: Secondary | ICD-10-CM | POA: Insufficient documentation

## 2023-06-02 DIAGNOSIS — R1900 Intra-abdominal and pelvic swelling, mass and lump, unspecified site: Secondary | ICD-10-CM | POA: Insufficient documentation

## 2023-06-02 HISTORY — DX: Pneumonia, unspecified organism: J18.9

## 2023-06-02 NOTE — Assessment & Plan Note (Signed)
 Mild on latest HST 05/2023 as per above - with significant proportion of central sleep apneas (33%). Will refer to Dr Kieran Pellet sleep medicine at [facility] pulmonology.

## 2023-06-02 NOTE — Assessment & Plan Note (Signed)
 Presumed lipoma. Further evaluate with limited abdominal soft tissue ultrasound.

## 2023-06-02 NOTE — Assessment & Plan Note (Signed)
 Noted on recent sleep study (hypoxia to 71% with 8% sleep time spent at <88% O2 saturation) - will refer to Dr Kieran Pellet sleep medicine at [facility] pulmonology.

## 2023-06-02 NOTE — Assessment & Plan Note (Signed)
 Will order f/u CXR in 1 month to ensure resolution of pneumonia

## 2023-06-04 ENCOUNTER — Encounter: Payer: Self-pay | Admitting: Sleep Medicine

## 2023-06-04 ENCOUNTER — Ambulatory Visit: Admitting: Sleep Medicine

## 2023-06-04 VITALS — BP 120/80 | HR 93 | Temp 98.0°F | Ht 67.0 in | Wt 196.0 lb

## 2023-06-04 DIAGNOSIS — F5104 Psychophysiologic insomnia: Secondary | ICD-10-CM

## 2023-06-04 DIAGNOSIS — G4733 Obstructive sleep apnea (adult) (pediatric): Secondary | ICD-10-CM

## 2023-06-04 DIAGNOSIS — G4700 Insomnia, unspecified: Secondary | ICD-10-CM | POA: Diagnosis not present

## 2023-06-04 DIAGNOSIS — F411 Generalized anxiety disorder: Secondary | ICD-10-CM

## 2023-06-04 DIAGNOSIS — F419 Anxiety disorder, unspecified: Secondary | ICD-10-CM | POA: Diagnosis not present

## 2023-06-04 NOTE — Patient Instructions (Signed)

## 2023-06-04 NOTE — Progress Notes (Signed)
 Name:Karen Aguilar MRN: [id number] DOB: 22-Dec-1965   CHIEF COMPLAINT:  EXCESSIVE DAYTIME SLEEPINESS   HISTORY OF PRESENT ILLNESS:  Karen Aguilar is a 58 y.o. w/ a h/o anxiety, chronic back pain, trigeminal neuralgia, hypothyroidism and obesity who presents for c/o intermittent loud snoring and excessive daytime sleepiness which has been present for several years. The patient underwent HST recently which revealed mild OSA. Reports nocturnal awakenings due to nocturia and nightmares, and has difficulty falling back to sleep. Reports a 40 lb weight loss over the last few years. Admits to dry mouth, night sweats and morning headaches. Denies RLS symptoms, dream enactment, cataplexy, hypnagogic or hypnapompic hallucinations. Reports a family history of sleep apnea. Denies drowsy driving. Drinks 1 cup of coffee daily, occasional alcohol  use, denies tobacco or illicit drug use. Reports taking a 30-90 min nap daily in the afternoon.   Bedtime 9 pm Sleep onset several hours Rise time 9-10 am   EPWORTH SLEEP SCORE 5    06/04/2023    2:00 PM  Results of the Epworth flowsheet  Sitting and reading 0  Watching TV 1  Sitting, inactive in a public place (e.g. a theatre or a meeting) 0  As a passenger in a car for an hour without a break 1  Lying down to rest in the afternoon when circumstances permit 2  Sitting and talking to someone 0  Sitting quietly after a lunch without alcohol  1  In a car, while stopped for a few minutes in traffic 0  Total score 5     PAST MEDICAL HISTORY :   has a past medical history of Abnormal MRI scan, bone (2012), Agnosia for temperature, Anemia, Anxiety, Anxiety and depression, Arachnoiditis, Arthritis, Chronic pain, COVID-19 virus infection (09/2018), DDD (degenerative disc disease), cervical, DDD (degenerative disc disease), lumbar, Depression, Depression with anxiety, Diverticulosis, Eczema, Family history of adverse reaction to anesthesia, Fibromyalgia, Foot  drop, Gallstones, GERD (gastroesophageal reflux disease), Headache(784.0), Hemorrhoid, Hemorrhoids, History of kidney stones, Hyperlipidemia, Hypothyroidism (1990s), IBS (irritable bowel syndrome), IBS (irritable bowel syndrome), Incontinence of urine, Iron deficiency, Liver hemangioma, Memory change, Migraines, Migraines, Mixed urge and stress incontinence (2008), Neurogenic bladder (2008), Neuropathy of lower extremity (2011), Nocturia, Notalgia paresthetica, Osteoarthritis (12/2013), PONV (postoperative nausea and vomiting), Pulmonary nodule (04/2012), Sleep apnea, Urine frequency, and Vitamin D  deficiency.  has a past surgical history that includes LASIK (Bilateral); Cholecystectomy (2001); Pubovaginal sling (2008); Shoulder Closed Reduction (Left, 12/18/2010); Back surgery; Back surgery (02/16/2011); Anterior cervical decomp/discectomy fusion; Cesarean section (1996; 2001); Lumbar wound debridement (03/15/2011); Spine surgery; PFT (01/28/2013); Esophagogastroduodenoscopy (03/2013); Esophagogastroduodenoscopy (12/2014); Tonsillectomy (1972); Mandible reconstruction (1986); Colonoscopy (10/2016); Hemorrhoid banding; Spinal cord stimulator insertion (N/A, 08/19/2015); Esophagogastroduodenoscopy (10/2015); Esophagogastroduodenoscopy (10/2016); Esophageal manometry (06/2017); Anterior cervical decomp/discectomy fusion (07/2017); Colonoscopy (03/2019); Esophagogastroduodenoscopy (03/2019); Artery Biopsy (Left, 09/02/2019); Esophagogastroduodenoscopy (09/2020); and Esophagogastroduodenoscopy (08/31/2020). Prior to Admission medications   Medication Sig Start Date End Date Taking? Authorizing Provider  Ascorbic Acid  (VITAMIN C  PO) Take 1 tablet by mouth as needed (for immune system support).     [provider]  baclofen  (LIORESAL ) 10 MG tablet Take 0.5 tablets (5 mg total) by mouth 3 (three) times daily as needed. 07/14/21   Donnie Galea, MD  calcium  carbonate (TUMS EX) 750 MG chewable tablet Chew 2-3  tablets by mouth 2 (two) times daily as needed.    [provider]  camphor-menthol  Eyvonne Hollering) lotion Apply 1 application topically at bedtime as needed for itching.     [provider]  Carboxymethylcellulose Sodium (EYE DROPS OP) Apply to eye. PRGF eye drops. 1 drop 4-6 times a day in left eye    [provider]  Cholecalciferol  (VITAMIN D ) 2000 units CAPS Take 1 capsule (2,000 Units total) by mouth daily. 10/10/15   Claire Crick, MD  diclofenac  sodium (VOLTAREN ) 1 % GEL APPLY THIN LAYER EXTERNALLY TO THE AFFECTED AREA UP TO FOUR TIMES DAILY AS NEEDED 12/31/17   Arnie Lao, MD  docusate sodium  (COLACE) 100 MG capsule Take 100 mg by mouth 2 (two) times daily. As needed    [provider]  LORazepam  (ATIVAN ) 1 MG tablet TAKE 1 TABLET BY MOUTH TWICE DAILY AS NEEDED FOR ANXIETY 04/12/23   Claire Crick, MD  NUCYNTA  ER 250 MG TB12 SMARTSIG:1 Tablet(s) By Mouth Every 12 Hours 07/13/21   [provider]  ondansetron  (ZOFRAN -ODT) 4 MG disintegrating tablet Take 1 tablet (4 mg total) by mouth every 8 (eight) hours as needed for nausea or vomiting. 05/22/23   Claire Crick, MD  tacrolimus  (PROTOPIC ) 0.03 % ointment Apply to eye. Custom compound 03/20/23 03/19/24  [provider]  thyroid  (ARMOUR) 90 MG tablet Take 1 tablet (90 mg total) by mouth daily. One day a week take 1/2 tablet 12/02/20   Claire Crick, MD  TYRVAYA  0.03 MG/ACT SOLN Place 1 spray into both nostrils 2 (two) times daily.    [provider]   Allergies  Allergen Reactions   Augmentin  [Amoxicillin -Pot Clavulanate] Swelling    Lip swelling and irritation.    Fluconazole  Swelling    Lips - swelling, bleeding, blisters, cracking, peeling   Erythromycin Nausea And Vomiting   Influenza Vaccines Hives and Swelling    Redness, heat, and extreme swelling at site   Levothyroxine  Hives   Thimerosal (Thiomersal) Swelling    Local reaction-redness,swelling; veins  popping   Tomato Other (See Comments)    Severe acid reflux with all acid foods   Atogepant Other (See Comments)    Gi symptoms   Atorvastatin  Other (See Comments)    GI upset, even once weekly dosing   Adhesive [Tape] Rash    blisters   Azithromycin Rash   Gabapentin Other (See Comments)    Short term memory loss   Imitrex [Sumatriptan] Other (See Comments)    Rebound migraine    Lyrica [Pregabalin] Other (See Comments)    Short term memory loss   Macrodantin  [Nitrofurantoin ] Rash   Macrolides And Ketolides Rash   Nickel Swelling and Rash   Sulfa Antibiotics Rash   Sulfasalazine Rash   Troleandomycin Rash   Vancomycin  Itching and Rash    Developed Redman's, may require premedication and infusion rate reduction in the future    FAMILY HISTORY:  family history includes Alzheimer's disease in her maternal grandmother; Breast cancer in her maternal aunt; CAD (age of onset: 45) in her maternal grandfather; Cancer in an other family member; Colon cancer in her paternal grandmother; Diabetes in her brother and daughter; Dysphagia in her mother; Heart attack in her maternal grandfather; Heart disease in her maternal grandfather; Irritable bowel syndrome in her mother; Lung cancer in her paternal grandfather; Lung cancer (age of onset: 70) in her father; Thyroid  disease in her cousin and mother. SOCIAL HISTORY:  reports that she has never smoked. She has been exposed to tobacco smoke. She has never used smokeless tobacco. She reports that she does not currently use alcohol  after a past usage of about 1.0 standard drink of alcohol  per week. She reports current drug  use.   Review of Systems:  Gen:  Denies  fever, sweats, chills weight loss  HEENT: Denies blurred vision, double vision, ear pain, eye pain, hearing loss, nose bleeds, sore throat Cardiac:  No dizziness, chest pain or heaviness, chest tightness,edema, No JVD Resp:   No cough, -sputum production, -shortness of breath,-wheezing,  -hemoptysis,  Gi: Denies swallowing difficulty, stomach pain, nausea or vomiting, diarrhea, constipation, bowel incontinence Gu:  Denies bladder incontinence, burning urine Ext:   Denies Joint pain, stiffness or swelling Skin: Denies  skin rash, easy bruising or bleeding or hives Endoc:  Denies polyuria, polydipsia , polyphagia or weight change Psych:   Denies depression, insomnia or hallucinations  Other:  All other systems negative  VITAL SIGNS: BP 120/80 (BP Location: Left Arm, Patient Position: Sitting, Cuff Size: Large)   Pulse 93   Temp 98 F (36.7 C) (Oral)   Ht 5\' 7"  (1.702 m)   Wt 196 lb (88.9 kg)   LMP 10/04/2014 Comment: per labs  SpO2 97%   BMI 30.70 kg/m    Physical Examination:   General Appearance: No distress  EYES PERRLA, EOM intact.   NECK Supple, No JVD Pulmonary: normal breath sounds, No wheezing.  CardiovascularNormal S1,S2.  No m/r/g.   Abdomen: Benign, Soft, non-tender. Skin:   warm, no rashes, no ecchymosis  Extremities: normal, no cyanosis, clubbing. Neuro:without focal findings,  speech normal  PSYCHIATRIC: Mood, affect within normal limits.   ASSESSMENT AND PLAN  OSA Reviewed HST results with patient. Starting on APAP therapy set to 4-14 cm H2O. Discussed the consequences of untreated sleep apnea. Advised not to drive drowsy for safety of patient and others. Will follow up in 3 months.   Anxiety I suspect that anxiety is the underlying cause of insomnia. Advised patient to follow up with PCP for further management.    Insomnia Counseled patient on stimulus control and improving sleep hygiene practices.    Patient  satisfied with Plan of action and management. All questions answered  I spent a total of 63 minutes reviewing chart data, face-to-face evaluation with the patient, counseling and coordination of care as detailed above.    Levi Klaiber, M.D.  Sleep Medicine [facility]

## 2023-06-05 ENCOUNTER — Ambulatory Visit
Admission: RE | Admit: 2023-06-05 | Discharge: 2023-06-05 | Disposition: A | Discharge status: Home / Self Care | Source: Ambulatory Visit | Attending: Family Medicine | Admitting: Family Medicine

## 2023-06-05 DIAGNOSIS — R1909 Other intra-abdominal and pelvic swelling, mass and lump: Secondary | ICD-10-CM | POA: Diagnosis present

## 2023-06-06 ENCOUNTER — Encounter: Payer: Self-pay | Admitting: Family Medicine

## 2023-06-06 ENCOUNTER — Other Ambulatory Visit (HOSPITAL_COMMUNITY): Payer: Self-pay

## 2023-06-06 ENCOUNTER — Telehealth: Payer: Self-pay

## 2023-06-06 ENCOUNTER — Other Ambulatory Visit: Payer: Self-pay

## 2023-06-06 DIAGNOSIS — F411 Generalized anxiety disorder: Secondary | ICD-10-CM

## 2023-06-06 MED ORDER — LORAZEPAM 1 MG PO TABS
1.0000 mg | ORAL_TABLET | Freq: Two times a day (BID) | ORAL | 0 refills | Status: DC | PRN
Start: 2023-06-06 — End: 2023-08-01

## 2023-06-06 NOTE — Telephone Encounter (Signed)
 Pharmacy Patient Advocate Encounter   Received notification from CoverMyMeds that prior authorization for Lorazepam  1 mg tabs is required/requested.   Insurance verification completed.   The patient is insured through [employer] .   Per test claim: Refill too soon. PA is not needed at this time. Medication was filled 06/06/23. Next eligible fill date is 06/29/23.

## 2023-06-06 NOTE — Patient Instructions (Signed)
 Visit Information  Thank you for taking time to visit with me today. Please don't hesitate to contact me if I can be of assistance to you before our next scheduled telephone appointment.  Our next appointment is by telephone on June 12, 2023 at 10:45 am  Following is a copy of your care plan:   Goals Addressed             This Visit's Progress    VBCI Transitions of Care (TOC) Care Plan   Improving    Problems:  Recent Hospitalization for treatment of Pulmonary Disease and Sepsis due to pneumonia Follow up with PCP for [facility] on May 31, 2023 - completed Patient has new CPAP initiatives from [facility] - Dr. Kieran Pellet - [facility] was where noted as faxed to  Goal:  Over the next 30 days, the patient will not experience hospital readmission  Interventions:  Transitions of Care: Follow up needed request within 14 day range Continue to increase diet with supplements as tolerate Contact PCP for questions and if symptoms worsen  Patient Self Care Activities:  Attend all scheduled provider appointments Call pharmacy for medication refills 3-7 days in advance of running out of medications Call provider office for new concerns or questions  Remember to get up slowly and use assistive device when navigating at home Follow up with PCP regarding anxiety medication - Lorazapam doses for refill  Plan:  The care management team will reach out to the patient again over the next 7-10 days. Continue to increase eating and drinking as tolerated to avoid dehydration Speak with your PCP regarding any increase feelings of depression or anxiety and for possible referral for counseling needs. Patient to:  Follow up with new recommendations for CPAP machine with [facility] - Dr. Kieran Pellet Patient to contact PCP regarding anxiety medication and sleep issues as noted from Dr. Kieran Pellet        Patient verbalizes understanding of instructions and care plan provided today and agrees to  view in MyChart. Active MyChart status and patient understanding of how to access instructions and care plan via MyChart confirmed with patient.     The patient has been provided with contact information for the care management team and has been advised to call with any health related questions or concerns.  The care management team will reach out to the patient again over the next 6- 10 days.  The patient will call Dr. Reddy's office regarding new CPAP* as advised to when delivery is expected.   Please call the care guide team at [phone] if you need to cancel or reschedule your appointment.   Please call the [place]  National Suicide Prevention Lifeline: (778)326-6538 or TTY: 657-211-6011 TTY (936)144-6252) to talk to a trained counselor if you are experiencing a Mental Health or Behavioral Health Crisis or need someone to talk to.  Brown Cape, RN, BSN, CCM [facility], [facility] RN Care Manager Direct Dial: [phone]

## 2023-06-06 NOTE — Transitions of Care (Post Inpatient/ED Visit) (Signed)
 Transition of Care week 3  Visit Note  06/06/2023  Name: Karen Aguilar MRN: [id number]          DOB: Jul 12, 1965  Situation: Patient enrolled in [facility] 30-day program. Visit completed with patient by telephone.   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 Marcelino [facility]   COVID-19 virus infection 09/2018   DDD (degenerative disc disease), cervical    DDD (degenerative disc disease), lumbar    s/p permanent nerve damage after back surgery   Depression    Depression with anxiety    Diverticulosis    Eczema    Family history of adverse reaction to anesthesia    Mother - nausea   Fibromyalgia    Foot drop    right - numbness, tingling - twisting   Gallstones    GERD (gastroesophageal reflux disease)    rare zantac  PRN N/V at times with this   Headache(784.0)    migraines   Hemorrhoid    Hemorrhoids    History of kidney stones    Hyperlipidemia    Hypothyroidism 1990s   hashimoto's thyroiditis   IBS (irritable bowel syndrome)    IBS (irritable bowel syndrome)    Incontinence of urine    s/p sling procedure-unresolved   Iron deficiency    h/o anemia, s/p iron infusion (2012)   Liver hemangioma     x2   Memory change    due to medications   Migraines    and h/o optical migraine   Migraines    Mixed urge and stress incontinence 2008   extensive workup including urodynamics, failed miltiple anticholinergics including myrbetriq and urogesic blue s/p PTNS, normal cystoscopy  2014, seen by Dr Clarke Crouch and Dr Ace Holder   Neurogenic bladder 2008   after 13 spine surgeries with periph neuropathy and chronic LBP   Neuropathy of lower extremity 2011   Nocturia    Notalgia paresthetica    Osteoarthritis 12/2013   PONV (postoperative nausea and vomiting)    Pulmonary nodule 04/2012   7.45mm RLL nodule - resolved on imaging 12/2012   Sleep apnea    mild-doesn't use cpap   Urine frequency    Vitamin D  deficiency     Assessment: Patient Reported Symptoms: Cognitive Cognitive Status: Alert and oriented to person, place, and time      Neurological Neurological Review of Symptoms: Weakness (but getting better)    HEENT HEENT Symptoms Reported: No symptoms reported      Cardiovascular Cardiovascular Symptoms Reported: No symptoms reported    Respiratory Respiratory Symptoms Reported: No symptoms reported Additional Respiratory Details: CPAP was ordered 06/04/23 Pulmoniologist office - Dr. Kieran Pellet - [facility]    Endocrine Patient reports the following symptoms related to hypoglycemia or hyperglycemia : No symptoms reported    Gastrointestinal Gastrointestinal Symptoms Reported: Constipation, No symptoms reported Additional Gastrointestinal Details: Miralax still working if needing relief  Genitourinary Genitourinary Symptoms Reported: No symptoms reported    Integumentary Integumentary Symptoms Reported: Not assessed    Musculoskeletal Musculoskelatal Symptoms Reviewed: Difficulty walking, Unsteady gait, Weakness Additional Musculoskeletal Details: Chronic problem        Psychosocial Psychosocial Symptoms Reported: Anxiety - if selected complete GAD Behavioral Health Conditions: Anxiety, Depression       There were no vitals filed for this visit.  Medications Reviewed Today     Reviewed by Jamie Mccoy, RN (Registered Nurse) on 06/06/23 at 1215  Med List Status: <None>   Medication Order Taking? Sig Documenting Provider Last Dose  Status Informant  Ascorbic Acid  (VITAMIN C  PO) [id number] No Take 1 tablet by mouth as needed (for immune system support).  [provider] Taking Active Self           Med Note Nicholette Barley, Winda Hastings Aug 15, 2015  6:51 PM)    baclofen  (LIORESAL ) 10 MG tablet [id number] No Take 0.5 tablets (5 mg total) by mouth 3 (three) times daily as needed. Donnie Galea, MD Taking Active Self  calcium  carbonate (TUMS EX) 750 MG chewable tablet [id number] No Chew 2-3 tablets by mouth 2 (two) times daily as needed. [provider] Taking Active Self           Med Note Verdia Glad, ZEA J   Tue Jun 04, 2023  1:58 PM)    camphor-menthol  [facility]) lotion [id number] No Apply 1 application topically at bedtime as needed for itching.  [provider] Taking Active Self  Carboxymethylcellulose Sodium (EYE DROPS OP) [id number] No Apply to eye. PRGF eye drops. 1 drop 4-6 times a day in left eye [provider] Taking Active Self  Cholecalciferol  (VITAMIN D ) 2000 units CAPS [id number] No Take 1 capsule (2,000 Units total) by mouth daily. Claire Crick, MD Taking Active Self  diclofenac  sodium (VOLTAREN ) 1 % GEL [id number] No APPLY THIN LAYER EXTERNALLY TO THE AFFECTED AREA UP TO FOUR TIMES DAILY AS NEEDED Arnie Lao, MD Taking Active Self           Med Note Miriam American, Aundra Lee May 22, 2023  4:24 PM) As needed  docusate sodium  (COLACE) 100 MG capsule [id number] No Take 100 mg by mouth 2 (two) times daily. As needed [provider] Taking Active Self           Med Note Miriam American, Aundra Lee May 22, 2023  4:23 PM) Only as needed  LORazepam  (ATIVAN ) 1 MG tablet [id number] No TAKE 1 TABLET BY MOUTH TWICE DAILY AS NEEDED FOR ANXIETY Claire Crick, MD Taking Active Self           Med Note Miriam American, Britta Candy Jun 06, 2023 11:05 AM) Taking differently - was taking an half a dose twice a day, to talk with PCP about night time anxiety   NUCYNTA  ER 250 MG TB12  [id number] No SMARTSIG:1 Tablet(s) By Mouth Every 12 Hours [provider] Taking Active Self  ondansetron  (ZOFRAN -ODT) 4 MG disintegrating tablet [id number] No Take 1 tablet (4 mg total) by mouth every 8 (eight) hours as needed for nausea or vomiting. Claire Crick, MD Taking Active   tacrolimus  (PROTOPIC ) 0.03 % ointment [id number] No Apply to eye. Custom compound [provider] Taking Active Self  thyroid  (ARMOUR) 90 MG tablet [id number] No Take 1 tablet (90 mg total) by mouth daily. One day a week take 1/2 tablet Claire Crick, MD Taking Active  Self  TYRVAYA  0.03 MG/ACT SOLN [id number] No Place 1 spray into both nostrils 2 (two) times daily. [provider] Taking Active Self           Med Note Miriam American, Aundra Lee May 22, 2023  4:22 PM) Patient experiencing nose bleed and pausing this for now             Goals Addressed             This Visit's Progress    VBCI Transitions of Care (TOC) Care Plan   Improving    Problems:  Recent Hospitalization for treatment of Pulmonary Disease and Sepsis due to pneumonia Follow up with PCP for [facility] on May 31, 2023 - completed Patient has new CPAP initiatives from [facility] - Dr. Kieran Pellet - [facility] was where noted as faxed to  Goal:  Over the next 30 days, the patient will not experience hospital readmission  Interventions:  Transitions of Care: Follow up needed request within 14 day range Continue to increase diet with supplements as tolerate Contact PCP for questions and if symptoms worsen  Patient Self Care Activities:  Attend all scheduled provider appointments Call pharmacy for medication refills 3-7 days in advance of running out of medications Call provider office for new concerns or questions  Remember to get up slowly and use assistive device when navigating at home Follow up with PCP regarding anxiety medication - Lorazapam doses for refill  Plan:  The care management  team will reach out to the patient again over the next 7-10 days. Continue to increase eating and drinking as tolerated to avoid dehydration Speak with your PCP regarding any increase feelings of depression or anxiety and for possible referral for counseling needs. Patient to:  Follow up with new recommendations for CPAP machine with [facility] - Dr. Kieran Pellet Patient to contact PCP regarding anxiety medication and sleep issues as noted from Dr. Kieran Pellet        Brown [place], RN, BSN, CCM [place]  [facility], [facility] RN Care Manager Direct Dial: [phone]

## 2023-06-11 ENCOUNTER — Other Ambulatory Visit (HOSPITAL_COMMUNITY): Payer: Self-pay

## 2023-06-11 ENCOUNTER — Telehealth: Payer: Self-pay

## 2023-06-11 ENCOUNTER — Ambulatory Visit: Payer: Medicare Other | Admitting: Family Medicine

## 2023-06-11 ENCOUNTER — Other Ambulatory Visit: Payer: Self-pay

## 2023-06-11 NOTE — Progress Notes (Unsigned)
 Have caregiver issues with her mom's health with counseling and caregiver support

## 2023-06-11 NOTE — Transitions of Care (Post Inpatient/ED Visit) (Unsigned)
 Transition of Care week 4  Visit Note  06/12/2023  Name: Karen Aguilar MRN: [id number]          DOB: 1965-11-04  Situation: Patient enrolled in [facility] 30-day program. Visit completed with patient by telephone.   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 Marcelino Sera Pain   COVID-19 virus infection 09/2018   DDD (degenerative disc disease), cervical    DDD (degenerative disc disease), lumbar    s/p permanent nerve damage after back surgery   Depression    Depression with anxiety    Diverticulosis    Eczema    Family history of adverse reaction to anesthesia    Mother - nausea   Fibromyalgia    Foot drop    right - numbness, tingling - twisting   Gallstones    GERD (gastroesophageal reflux disease)    rare zantac  PRN N/V at times with this   Headache(784.0)    migraines   Hemorrhoid    Hemorrhoids    History of kidney stones    Hyperlipidemia    Hypothyroidism 1990s   hashimoto's thyroiditis   IBS (irritable bowel syndrome)    IBS (irritable bowel syndrome)    Incontinence of urine    s/p sling procedure-unresolved   Iron deficiency    h/o anemia, s/p iron infusion (2012)   Liver hemangioma     x2   Memory change    due to medications   Migraines    and h/o optical migraine   Migraines    Mixed urge and stress incontinence 2008   extensive workup including urodynamics, failed miltiple anticholinergics including myrbetriq and urogesic blue s/p PTNS, normal cystoscopy  2014, seen by Dr Clarke Crouch and Dr Ace Holder   Neurogenic bladder 2008   after 13 spine surgeries with periph neuropathy and chronic LBP   Neuropathy of lower extremity 2011   Nocturia    Notalgia paresthetica    Osteoarthritis 12/2013   PONV (postoperative nausea and vomiting)    Pulmonary nodule 04/2012   7.46mm RLL nodule - resolved on imaging 12/2012   Sleep apnea    mild-doesn't use cpap   Urine frequency    Vitamin D  deficiency     Assessment: Patient Reported Symptoms: Cognitive Cognitive Status: Alert and oriented to person, place, and time      Neurological Neurological Review of Symptoms: Weakness Neurological Self-Management Outcome: 3 (uncertain) Neurological Comment: Better  HEENT HEENT Symptoms Reported: No symptoms reported HEENT Comment: better    Cardiovascular Cardiovascular Symptoms Reported: No symptoms reported Weight: 194 lb (88 kg)  Respiratory Respiratory Symptoms Reported: No symptoms reported Other Respiratory Symptoms: much better Additional Respiratory Details: Awaiting CPAP machine Respiratory Self-Management Outcome: 3 (uncertain)  Endocrine Patient reports the following symptoms related to hypoglycemia or hyperglycemia : No symptoms reported Is patient diabetic?: No    Gastrointestinal Gastrointestinal Symptoms Reported: No symptoms reported  Genitourinary Genitourinary Symptoms Reported: No symptoms reported    Integumentary Integumentary Symptoms Reported: Skin changes Additional Integumentary Details: lypomas on chest - starting to have tenderness Skin Conditions: Other Other Skin Conditions: lypoma  Musculoskeletal Musculoskelatal Symptoms Reviewed: No symptoms reported Additional Musculoskeletal Details: no changes, chronic Musculoskeletal Self-Management Outcome: 2 (bad)      Psychosocial Psychosocial Symptoms Reported: Anxiety - if selected complete GAD Behavioral Health Conditions: Anxiety, Depression Behavioral Health  Self-Management Outcome: 3 (uncertain) Behavioral Health Comment: Chronic Techniques to Cope with Loss/Stress/Change: Diversional activities     Vitals:   06/11/23 1050  BP: 110/70    Medications Reviewed Today     Reviewed by Jamie Mccoy, RN (Registered Nurse) on 06/11/23 at 1108  Med List Status: <None>   Medication Order Taking? Sig Documenting Provider Last Dose Status Informant  Ascorbic Acid  (VITAMIN C  PO) [id number]  Take 1 tablet by mouth as needed (for immune system support).  [provider]  Active Self           Med Note Nicholette Barley, Winda Hastings Aug 15, 2015  6:51 PM)    baclofen  (LIORESAL ) 10 MG tablet [id number]  Take 0.5 tablets (5 mg total) by mouth 3 (three) times daily as needed. Donnie Galea, MD  Active Self  calcium  carbonate (TUMS EX) 750 MG chewable tablet [id number]  Chew 2-3 tablets by mouth 2 (two) times daily as needed. [provider]  Active Self           Med Note Verdia Glad, ZEA J   Tue Jun 04, 2023  1:58 PM)    camphor-menthol  [facility]) lotion [id number]  Apply 1 application topically at bedtime as needed for itching.  [provider]  Active Self  Carboxymethylcellulose Sodium (EYE DROPS OP) [id number]  Apply to eye. PRGF eye drops. 1 drop 4-6 times a day in left eye [provider]  Active Self  Cholecalciferol  (VITAMIN D ) 2000 units CAPS [id number]  Take 1 capsule (2,000 Units total) by mouth daily. Claire Crick, MD  Active Self  diclofenac  sodium (VOLTAREN ) 1 % GEL [id number]  APPLY THIN LAYER EXTERNALLY TO THE AFFECTED AREA UP TO FOUR TIMES DAILY AS NEEDED Arnie Lao, MD  Active Self           Med Note Miriam American, Aundra Lee May 22, 2023  4:24 PM) As needed  docusate sodium  (COLACE) 100 MG capsule [id number]  Take 100 mg by mouth 2 (two) times daily. As needed [provider]  Active Self           Med Note Miriam American, Aundra Lee May 22, 2023  4:23 PM) Only as needed  LORazepam  (ATIVAN )  1 MG tablet [id number]  Take 1 tablet (1 mg total) by mouth 2 (two) times daily as needed for anxiety or sleep. for anxiety Claire Crick, MD  Active   NUCYNTA  ER 250 MG TB12 [id number]  SMARTSIG:1 Tablet(s) By Mouth Every 12 Hours [provider]  Active Self  ondansetron  (ZOFRAN -ODT) 4 MG disintegrating tablet [id number]  Take 1 tablet (4 mg total) by mouth every 8 (eight) hours as needed for nausea or vomiting. Claire Crick, MD  Active   tacrolimus  (PROTOPIC ) 0.03 % ointment [id number]  Apply to eye. Custom compound [provider]  Active Self  thyroid  (ARMOUR) 90 MG tablet [id number]  Take 1 tablet (90 mg total) by mouth daily. One day a week take 1/2 tablet Claire Crick, MD  Active  Self  TYRVAYA  0.03 MG/ACT SOLN [id number] Yes Place 1 spray into both nostrils 2 (two) times daily. [provider] Taking Active Self           Med Note Miriam American, Rowan Cooter   Tue Jun 11, 2023 11:08 AM) No further problems           Brown [place], RN, BSN, CCM   [facility], [facility] Health RN Care Manager Direct Dial: [phone]

## 2023-06-11 NOTE — Patient Instructions (Signed)
 Visit Information  Thank you for taking time to visit with me today. Please don't hesitate to contact me if I can be of assistance to you before our next scheduled telephone appointment.  Our next appointment is {NEXTVISITTYPE:31136} on *** at ***  Following is a copy of your care plan:   Goals Addressed   None     {CM PT PRINT INSTRUCTIONS:31139}  {CM FOLLOW UP PLAN:31140}  Please call the care guide team at [phone] if you need to cancel or reschedule your appointment.   Please {MHBHCRISISCONTACTS:31144} if you are experiencing a Mental Health or Behavioral Health Crisis or need someone to talk to.  SIGNATURE***

## 2023-06-12 ENCOUNTER — Ambulatory Visit: Payer: Self-pay | Admitting: Family Medicine

## 2023-06-21 ENCOUNTER — Encounter: Payer: Self-pay | Admitting: Sleep Medicine

## 2023-06-21 ENCOUNTER — Other Ambulatory Visit: Payer: Self-pay

## 2023-06-21 ENCOUNTER — Telehealth: Payer: Self-pay

## 2023-06-21 DIAGNOSIS — Z0000 Encounter for general adult medical examination without abnormal findings: Secondary | ICD-10-CM

## 2023-06-21 DIAGNOSIS — K219 Gastro-esophageal reflux disease without esophagitis: Secondary | ICD-10-CM

## 2023-06-21 DIAGNOSIS — A419 Sepsis, unspecified organism: Secondary | ICD-10-CM

## 2023-06-21 DIAGNOSIS — G4733 Obstructive sleep apnea (adult) (pediatric): Secondary | ICD-10-CM

## 2023-06-21 NOTE — Patient Instructions (Signed)
 Visit Information  Thank you for taking time to visit with me today. Please don't hesitate to contact me if I can be of assistance to you before our next scheduled telephone appointment.   Following is a copy of your care plan:   Goals Addressed             This Visit's Progress    VBCI Transitions of Care (TOC) Care Plan   Improving    Problems:  Recent Hospitalization for treatment of Pulmonary Disease and Sepsis due to pneumonia Follow up with PCP for [facility] on May 31, 2023 - completed Patient has new CPAP initiatives from [facility] - Dr. Kieran Pellet - [facility] was where noted as faxed to Has new CPAP adjusting to changes and pressures  Goal:  Over the next 30 days, the patient will not experience hospital readmission  Interventions:  Transitions of Care: Follow up needed request within 14 day range Continue to increase diet with supplements as tolerate Contact PCP for questions and if symptoms worsen and concerns for sepsis - with ongoing concerns with her immune system  Patient Self Care Activities:  Attend all scheduled provider appointments Call pharmacy for medication refills 3-7 days in advance of running out of medications Call provider office for new concerns or questions  Remember to get up slowly and use assistive device when navigating at home Follow up with PCP regarding anxiety medication - Lorazapam doses for refill, as follow up notes next refill for June 19, 2023. - no issues she noted,   Plan:  The care management team will reach out to the patient again over the next 7-10 days. Continue to increase eating and drinking as tolerated to avoid dehydration Speak with your PCP regarding any increase feelings of depression or anxiety and for possible referral for counseling needs. Patient to:  Follow up with new recommendations for CPAP machine with [facility] - Dr. Kieran Pellet.   06/11/23 new CPAP to arrive to patient's home this weekend per  patient., patient started on new CPAP  06/19/23 feels setting are too high and has sent a MyChart message to Dr. Kieran Pellet for symptoms with new CPAP settings and has received a message that message has been received. Offered patient Child psychotherapist with complex Care Management team for anxiety and caregiver needs.   Care GAPS addressed:  Patient has own concerns for vaccines - Shringrix - and follow up and does not wish to take it. Expressed to patient OB-GYN appointment for cervical cancer screening Will refer to RN Care Coordinator for Complex Care Coordination for Complex Disease        Patient verbalizes understanding of instructions and care plan provided today and agrees to view in MyChart. Active MyChart status and patient understanding of how to access instructions and care plan via MyChart confirmed with patient.     Telephone follow up appointment with care management team member scheduled for: The care management team will reach out to the patient again over the next 30 days days.  Follow up with provider re: Dr. Kieran Pellet with ongoing CPAP issues and needs and to contact the representative with any additional questions was discussed.  Please call the care guide team at [phone] if you need to cancel or reschedule your appointment.   Please call the [place]  National Suicide Prevention Lifeline: 906-143-1039 or TTY: (312) 352-2887 TTY 947 012 4484) to talk to a trained counselor call 1-800-273-TALK (toll free, 24 hour hotline) if you are experiencing a Mental Health or Behavioral Health  Crisis or need someone to talk to.  Brown Cape, RN, BSN, CCM [facility], [facility] Health RN Care Manager Direct Dial: [phone]

## 2023-06-21 NOTE — Transitions of Care (Post Inpatient/ED Visit) (Signed)
 Transition of Care Week 4/5   Visit Note  06/21/2023  Name: Karen Aguilar MRN: [id number]          DOB: April 08, 1965  Situation: Patient enrolled in [facility] 30-day program. Visit completed with patient  by telephone.   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 Marcelino Sera Pain   COVID-19 virus infection 09/2018   DDD (degenerative disc disease), cervical    DDD (degenerative disc disease), lumbar    s/p permanent nerve damage after back surgery   Depression    Depression with anxiety    Diverticulosis    Eczema    Family history of adverse reaction to anesthesia    Mother - nausea   Fibromyalgia    Foot drop    right - numbness, tingling - twisting   Gallstones    GERD (gastroesophageal reflux disease)    rare zantac  PRN N/V at times with this   Headache(784.0)    migraines   Hemorrhoid    Hemorrhoids    History of kidney stones    Hyperlipidemia    Hypothyroidism 1990s   hashimoto's thyroiditis   IBS (irritable bowel syndrome)    IBS (irritable bowel syndrome)    Incontinence of urine    s/p sling procedure-unresolved   Iron deficiency    h/o anemia, s/p iron infusion (2012)   Liver hemangioma     x2   Memory change    due to medications   Migraines    and h/o optical migraine   Migraines    Mixed urge and stress incontinence 2008   extensive workup including urodynamics, failed miltiple anticholinergics including myrbetriq and urogesic blue s/p PTNS, normal  cystoscopy 2014, seen by Dr Clarke Crouch and Dr Ace Holder   Neurogenic bladder 2008   after 13 spine surgeries with periph neuropathy and chronic LBP   Neuropathy of lower extremity 2011   Nocturia    Notalgia paresthetica    Osteoarthritis 12/2013   PONV (postoperative nausea and vomiting)    Pulmonary nodule 04/2012   7.11mm RLL nodule - resolved on imaging 12/2012   Sleep apnea    mild-doesn't use cpap   Urine frequency    Vitamin D  deficiency     Assessment: Patient Reported Symptoms: Cognitive Cognitive Status: Alert and oriented to person, place, and time      Neurological Neurological Review of Symptoms: No symptoms reported    HEENT HEENT Symptoms Reported: Not assessed (no issues)      Cardiovascular Cardiovascular Symptoms Reported: No symptoms reported Weight: 193 lb (87.5 kg)  Respiratory Other Respiratory Symptoms: Feels like new CPAP is sufficating Additional Respiratory Details: CPAP on for two nights Respiratory Self-Management Outcome: 2 (bad)  Endocrine Patient reports the following symptoms related to hypoglycemia or hyperglycemia : No symptoms reported Is patient diabetic?: No    Gastrointestinal Gastrointestinal Symptoms Reported: Other Additional Gastrointestinal Details: Bloating, burping, acid reflex kicking up  some with new CPAP Gastrointestinal Self-Management Outcome: 3 (uncertain) Gastrointestinal Comment: fair    Genitourinary Genitourinary Symptoms Reported: No symptoms reported    Integumentary Integumentary Symptoms Reported: Skin changes Additional Integumentary Details: follow up with PCP    Musculoskeletal Musculoskelatal Symptoms Reviewed: No symptoms reported Additional Musculoskeletal Details: Chronic        Psychosocial Psychosocial Symptoms Reported: Anxiety - if selected complete GAD         Vitals:   06/21/23 1100  SpO2: (!) 78%  Patient states this reading was from her ring she purchased to monitor her oxygen  levels and  this is what is was showing as her lowest during her night with CPAP.  Medications Reviewed Today     Reviewed by Jamie Mccoy, RN (Registered Nurse) on 06/21/23 at 1127  Med List Status: <None>   Medication Order Taking? Sig Documenting Provider Last Dose Status Informant  Ascorbic Acid  (VITAMIN C  PO) [id number] No Take 1 tablet by mouth as needed (for immune system support).  [provider] Taking Active Self           Med Note Nicholette Barley, Winda Hastings Aug 15, 2015  6:51 PM)    baclofen  (LIORESAL ) 10 MG tablet [id number] No Take 0.5 tablets (5 mg total) by mouth 3 (three) times daily as needed. Donnie Galea, MD Taking Active Self  calcium  carbonate (TUMS EX) 750 MG chewable tablet [id number] No Chew 2-3 tablets by mouth 2 (two) times daily as needed. [provider] Taking Active Self           Med Note Verdia Glad, ZEA J   Tue Jun 04, 2023  1:58 PM)    camphor-menthol  [facility]) lotion [id number] No Apply 1 application topically at bedtime as needed for itching.  [provider] Taking Active Self  Carboxymethylcellulose Sodium (EYE DROPS OP) [id number] No Apply to eye. PRGF eye drops. 1 drop 4-6 times a day in left eye [provider] Taking Active Self  Cholecalciferol  (VITAMIN D ) 2000 units CAPS [id number] No Take 1 capsule (2,000 Units total) by mouth daily. Claire Crick, MD Taking Active Self  diclofenac  sodium (VOLTAREN ) 1 % GEL [id number] No APPLY THIN LAYER EXTERNALLY TO THE AFFECTED AREA UP TO FOUR TIMES DAILY AS NEEDED Arnie Lao, MD Taking Active Self           Med Note Miriam American, Aundra Lee May 22, 2023  4:24 PM) As needed  docusate sodium  (COLACE) 100 MG capsule [id number] No Take 100 mg by mouth 2 (two) times daily. As needed [provider] Taking Active Self           Med Note Miriam American, Aundra Lee May 22, 2023  4:23 PM) Only as needed  LORazepam  (ATIVAN ) 1 MG tablet [id number]  Take 1 tablet (1 mg total) by  mouth 2 (two) times daily as needed for anxiety or sleep. for anxiety Claire Crick, MD  Active   NUCYNTA  ER 250 MG TB12 [id number] No SMARTSIG:1 Tablet(s) By Mouth Every 12 Hours [provider] Taking Active Self  ondansetron  (ZOFRAN -ODT) 4 MG disintegrating tablet [id number] No Take 1 tablet (4 mg total) by mouth every 8 (eight) hours as needed for nausea or vomiting. Claire Crick, MD Taking Active   tacrolimus  (PROTOPIC ) 0.03 % ointment [id number] No Apply to eye. Custom compound [provider] Taking Active Self  thyroid  (ARMOUR) 90 MG tablet [id number] No Take 1 tablet (90 mg total) by mouth  daily. One day a week take 1/2 tablet Claire Crick, MD Taking Active Self  TYRVAYA  0.03 MG/ACT SOLN [id number] No Place 1 spray into both nostrils 2 (two) times daily. [provider] Taking Active Self           Med Note Miriam American, Rowan Cooter   Tue Jun 11, 2023 11:08 AM) No further problems            Recommendation:   Specialty provider follow-up patient to follow up as she  has sent a MyChart message to Dr. Kieran Pellet Pulmonologist for concerns with oxygen  and  CPAP needs Referral to: Community RN Complex Care Management Continue Current Plan of Care ongoing respiratory health journey    [place], RN, BSN, CCM [place]  [facility], [facility] Health RN Care Manager Direct Dial: [phone]

## 2023-07-02 ENCOUNTER — Ambulatory Visit (INDEPENDENT_AMBULATORY_CARE_PROVIDER_SITE_OTHER)
Admission: RE | Admit: 2023-07-02 | Discharge: 2023-07-02 | Disposition: A | Discharge status: Home / Self Care | Source: Ambulatory Visit | Attending: Family Medicine | Admitting: Family Medicine

## 2023-07-02 ENCOUNTER — Other Ambulatory Visit: Payer: Self-pay | Admitting: Family Medicine

## 2023-07-02 DIAGNOSIS — J189 Pneumonia, unspecified organism: Secondary | ICD-10-CM

## 2023-07-04 ENCOUNTER — Ambulatory Visit: Payer: Self-pay | Admitting: Family Medicine

## 2023-07-12 ENCOUNTER — Telehealth: Payer: Self-pay

## 2023-07-12 NOTE — Progress Notes (Signed)
 Complex Care Management Note Care Guide Note  07/12/2023 Name: Karen Aguilar MRN: [id number] DOB: [birth year]   Complex Care Management Outreach Attempts: An unsuccessful telephone outreach was attempted today to offer the patient information about available complex care management services.  Follow Up Plan:  Additional outreach attempts will be made to offer the patient complex care management information and services.   Encounter Outcome:  Patient Scheduled  Dreama [facility]  [facility], [facility] Health Care Management Assistant Direct Dial: [phone]  Fax: [phone]

## 2023-07-16 NOTE — Progress Notes (Signed)
 Complex Care Management Note Care Guide Note  07/16/2023 Name: Karen Aguilar MRN: [id number] DOB: [birth year]   Complex Care Management Outreach Attempts: A second unsuccessful outreach was attempted today to offer the patient with information about available complex care management services.  Follow Up Plan:  Additional outreach attempts will be made to offer the patient complex care management information and services.   Encounter Outcome:  No Answer  Dreama [facility]  [facility], [facility] Health Care Management Assistant Direct Dial: [phone]  Fax: [phone]

## 2023-07-22 NOTE — Progress Notes (Signed)
 Complex Care Management Note Care Guide Note  07/22/2023 Name: Karen Aguilar MRN: [id number] DOB: 1965/08/03   Complex Care Management Outreach Attempts: A third unsuccessful outreach was attempted today to offer the patient with information about available complex care management services.  Follow Up Plan:  No further outreach attempts will be made at this time. We have been unable to contact the patient to offer or enroll patient in complex care management services.  Encounter Outcome:  No Answer  Dreama [facility]  [facility], [facility] Health Care Management Assistant Direct Dial: [phone]  Fax: [phone]

## 2023-07-31 ENCOUNTER — Ambulatory Visit: Admitting: Family Medicine

## 2023-07-31 ENCOUNTER — Encounter: Payer: Self-pay | Admitting: Family Medicine

## 2023-07-31 VITALS — BP 120/78 | HR 95 | Temp 98.0°F | Ht 67.0 in | Wt 197.1 lb

## 2023-07-31 DIAGNOSIS — G4700 Insomnia, unspecified: Secondary | ICD-10-CM

## 2023-07-31 DIAGNOSIS — F411 Generalized anxiety disorder: Secondary | ICD-10-CM | POA: Diagnosis not present

## 2023-07-31 DIAGNOSIS — G4733 Obstructive sleep apnea (adult) (pediatric): Secondary | ICD-10-CM | POA: Diagnosis not present

## 2023-07-31 DIAGNOSIS — H04123 Dry eye syndrome of bilateral lacrimal glands: Secondary | ICD-10-CM | POA: Diagnosis not present

## 2023-07-31 DIAGNOSIS — K529 Noninfective gastroenteritis and colitis, unspecified: Secondary | ICD-10-CM

## 2023-07-31 DIAGNOSIS — M797 Fibromyalgia: Secondary | ICD-10-CM

## 2023-07-31 DIAGNOSIS — G039 Meningitis, unspecified: Secondary | ICD-10-CM

## 2023-07-31 DIAGNOSIS — R252 Cramp and spasm: Secondary | ICD-10-CM

## 2023-07-31 DIAGNOSIS — G894 Chronic pain syndrome: Secondary | ICD-10-CM

## 2023-07-31 DIAGNOSIS — H168 Other keratitis: Secondary | ICD-10-CM

## 2023-07-31 DIAGNOSIS — H16229 Keratoconjunctivitis sicca, not specified as Sjogren's, unspecified eye: Secondary | ICD-10-CM

## 2023-07-31 NOTE — Progress Notes (Unsigned)
 Ph: [phone] Fax: [phone]   Patient ID: Karen Aguilar, female    DOB: [birth year], 58 y.o.   MRN: [id number]  This visit was conducted in person.  BP 120/78   Pulse 95   Temp 98 F (36.7 C) (Oral)   Ht 5' 7 (1.702 m)   Wt 197 lb 2 oz (89.4 kg)   LMP 10/04/2014 Comment: per labs  SpO2 97%   BMI 30.87 kg/m    CC: 2 mo f/u visit  Subjective:   HPI: Karen Aguilar is a 58 y.o. female presenting on 07/31/2023 for Medical Management of Chronic Issues (2 mth f/u )   See prior note for details - nodules to abdomen s/p abd soft tissue US  most consistent with lipomas.  US  ABD LIMITED IMPRESSION 06/09/2023: Multiple ill-defined subcutaneous nodules which are slightly hypoechoic to background fat demonstrating internal striations. These are favored to reflect lipomas. If there is clinical concern for atypical features such as pain or growth, further evaluation with MRI with and without contrast could be considered.   Saw Dr Jess pulmonology started autoCPAP with O2 - she has had trouble tolerating higher settings. Will follow up with Dr Jess.  Most recently we increased lorazepam  to 1 whole tablet BID PRN (was previously taking 1/2 tab). She has dropped back to 1/2 tablet at night - as higher dose led to worsening eye dryness.  Last filled #60 on 06/06/2023.   Since last seen, saw endo Dr Damian for known hashimoto thyroiditis - stable period on armour thyroid  90mg  6d/wk, 45mg  on Sundays.   Continues seeing Dr Tobin ophthalmology for keratoconjunctivitis sicca and L eye neurotrophic keratopathy, on PRGF eye drops. Most recently started tacrolimus  0.03% ointment around eyes.   She started using new pre-probiotic and [facility] brand Ritual with benefit. She's considering using sleep supplement from them as well.   Notes ongoing fatigue, myalgias especially after long days. She notes worsening symptoms over several months.  Only on low dose baclofen  - higher doses of muscle relaxants  worsen migraines.  Entire hands wake up numb and tingling.  Sees [facility] rheum.  Notes cymbalta  was helpful but cannot take due to Nucynta  use.      Relevant past medical, surgical, family and social history reviewed and updated as indicated. Interim medical history since our last visit reviewed. Allergies and medications reviewed and updated. Outpatient Medications Prior to Visit  Medication Sig Dispense Refill   Ascorbic Acid  (VITAMIN C  PO) Take 1 tablet by mouth as needed (for immune system support).      Bacillus Coagulans-Inulin (PROBIOTIC-PREBIOTIC PO) Take 1 capsule by mouth daily.     baclofen  (LIORESAL ) 10 MG tablet Take 0.5 tablets (5 mg total) by mouth 3 (three) times daily as needed.     calcium  carbonate (TUMS EX) 750 MG chewable tablet Chew 2-3 tablets by mouth 2 (two) times daily as needed.     camphor-menthol  (SARNA) lotion Apply 1 application topically at bedtime as needed for itching.      Carboxymethylcellulose Sodium (EYE DROPS OP) Apply to eye. PRGF eye drops. 1 drop 4-6 times a day in left eye     diclofenac  sodium (VOLTAREN ) 1 % GEL APPLY THIN LAYER EXTERNALLY TO THE AFFECTED AREA UP TO FOUR TIMES DAILY AS NEEDED 300 g 0   docusate sodium  (COLACE) 100 MG capsule Take 100 mg by mouth 2 (two) times daily. As needed     NUCYNTA  ER 250 MG TB12 SMARTSIG:1 Tablet(s) By Mouth Every  12 Hours     ondansetron  (ZOFRAN -ODT) 4 MG disintegrating tablet Take 1 tablet (4 mg total) by mouth every 8 (eight) hours as needed for nausea or vomiting. 20 tablet 0   tacrolimus  (PROTOPIC ) 0.03 % ointment Apply to eye. Custom compound     thyroid  (ARMOUR) 90 MG tablet Take 1 tablet (90 mg total) by mouth daily. One day a week take 1/2 tablet     TYRVAYA  0.03 MG/ACT SOLN Place 1 spray into both nostrils 2 (two) times daily.     LORazepam  (ATIVAN ) 1 MG tablet Take 1 tablet (1 mg total) by mouth 2 (two) times daily as needed for anxiety or sleep. for anxiety 60 tablet 0   LORazepam  (ATIVAN ) 1 MG  tablet Take 0.5 tablets (0.5 mg total) by mouth 2 (two) times daily as needed for anxiety or sleep.     Cholecalciferol  (VITAMIN D ) 2000 units CAPS Take 1 capsule (2,000 Units total) by mouth daily. 30 capsule    No facility-administered medications prior to visit.     Per HPI unless specifically indicated in ROS section below Review of Systems  Objective:  BP 120/78   Pulse 95   Temp 98 F (36.7 C) (Oral)   Ht 5' 7 (1.702 m)   Wt 197 lb 2 oz (89.4 kg)   LMP 10/04/2014 Comment: per labs  SpO2 97%   BMI 30.87 kg/m   Wt Readings from Last 3 Encounters:  07/31/23 197 lb 2 oz (89.4 kg)  06/21/23 193 lb (87.5 kg)  06/11/23 194 lb (88 kg)      Physical Exam Vitals and nursing note reviewed.  Constitutional:      Appearance: Normal appearance. She is not ill-appearing.  HENT:     Head: Normocephalic and atraumatic.     Mouth/Throat:     Mouth: Mucous membranes are moist.     Pharynx: Oropharynx is clear. No oropharyngeal exudate or posterior oropharyngeal erythema.  Eyes:     Extraocular Movements: Extraocular movements intact.     Pupils: Pupils are equal, round, and reactive to light.  Cardiovascular:     Rate and Rhythm: Normal rate and regular rhythm.     Pulses: Normal pulses.     Heart sounds: Normal heart sounds. No murmur heard. Pulmonary:     Effort: Pulmonary effort is normal. No respiratory distress.     Breath sounds: Normal breath sounds. No wheezing, rhonchi or rales.  Musculoskeletal:     Cervical back: Normal range of motion and neck supple.     Right lower leg: No edema.     Left lower leg: No edema.  Skin:    General: Skin is warm and dry.     Findings: No rash.  Neurological:     Mental Status: She is alert.  Psychiatric:        Mood and Affect: Mood normal.        Behavior: Behavior normal.       Lab Results  Component Value Date   CKTOTAL 76 03/08/2023   Lab Results  Component Value Date   ESRSEDRATE 59 (H) 03/08/2023   POCTSEDRATE 52  (A) 02/01/2013    Lab Results  Component Value Date   CRP 1.6 03/08/2023   Lab Results  Component Value Date   VITAMINB12 508 04/12/2023   Lab Results  Component Value Date   ANA POSITIVE (A) 07/09/2022    Assessment & Plan:   Problem List Items Addressed This Visit  Fibromyalgia   Contributes to chronic pain. Intolerant to lyrica, gabapentin Unable to take cymbalta  due to interaction with nucynta .       Chronic pain syndrome   Takes Nucynta  through pain clinic (Dr Orlando at [facility]).       OSA on CPAP - Primary   Appreciate pulm care.      Arachnoiditis   Contributes to lower extremity pain.      Chronic diarrhea   Notes significant improvement since starting Ritual supplement brand pre/probiotic.       GAD (generalized anxiety disorder)   Declines daily anxiety medication ie antidepressant due to concern over side effects.      Relevant Medications   LORazepam  (ATIVAN ) 1 MG tablet   Chronically dry eyes, bilateral   Neurotrophic keratitis of left eye   Sees Dr Tobin ocular immunologist at [facility] on PRGF drops as well as protopic  ointment around eyes and Tryvaya spray PRN.       Muscle cramping   Notes worsening upper body cramping despite baclofen . Intolerance to stronger muscle relaxants.  Suggested trial of OTC potassium supplementation given h/o low normal potassium levels.       Insomnia   She continues lorazepam  1mg  1/2 tab at bedtime - titration limited by dry eyes side effect. Discussed possible trazodone  or silenor - however again concern would be dry eye side effect.  She is planning to try sleep supplement through [facility] brand      Keratoconjunctivitis sicca not specified as Sjogren's     No orders of the defined types were placed in this encounter.   No orders of the defined types were placed in this encounter.   Patient Instructions  Keep rheumatology and ophthalmology appointments.  Try the sleep supplement, if not  helpful, we could consider trial of medicine like trazodone  for sleep Good to see you today  Return as needed  Consider OTC potassium supplement for 1-2 weeks to see effect on muscle cramping/pain.   Follow up plan: Return if symptoms worsen or fail to improve.  Anton Blas, MD

## 2023-07-31 NOTE — Patient Instructions (Addendum)
 Keep rheumatology and ophthalmology appointments.  Try the sleep supplement, if not helpful, we could consider trial of medicine like trazodone  for sleep Good to see you today  Return as needed  Consider OTC potassium supplement for 1-2 weeks to see effect on muscle cramping/pain.

## 2023-08-01 ENCOUNTER — Encounter: Payer: Self-pay | Admitting: Family Medicine

## 2023-08-01 DIAGNOSIS — G4700 Insomnia, unspecified: Secondary | ICD-10-CM | POA: Insufficient documentation

## 2023-08-01 DIAGNOSIS — H16229 Keratoconjunctivitis sicca, not specified as Sjogren's, unspecified eye: Secondary | ICD-10-CM | POA: Insufficient documentation

## 2023-08-01 NOTE — Assessment & Plan Note (Signed)
Appreciate pulm care.  ?

## 2023-08-01 NOTE — Assessment & Plan Note (Signed)
 Notes worsening upper body cramping despite baclofen . Intolerance to stronger muscle relaxants.  Suggested trial of OTC potassium supplementation given h/o low normal potassium levels.

## 2023-08-01 NOTE — Assessment & Plan Note (Signed)
 Takes Nucynta  through pain clinic (Dr Orlando at [facility]).

## 2023-08-01 NOTE — Assessment & Plan Note (Signed)
 Declines daily anxiety medication ie antidepressant due to concern over side effects.

## 2023-08-01 NOTE — Assessment & Plan Note (Addendum)
 She continues lorazepam  1mg  1/2 tab at bedtime - titration limited by dry eyes side effect. Discussed possible trazodone  or silenor - however again concern would be dry eye side effect.  She is planning to try sleep supplement through [employer] brand

## 2023-08-01 NOTE — Assessment & Plan Note (Signed)
 Contributes to chronic pain. Intolerant to lyrica, gabapentin Unable to take cymbalta  due to interaction with nucynta .

## 2023-08-01 NOTE — Assessment & Plan Note (Addendum)
 Sees Dr Tobin ocular immunologist at [facility] on PRGF drops as well as protopic  ointment around eyes and Tryvaya spray PRN.

## 2023-08-01 NOTE — Assessment & Plan Note (Signed)
 Contributes to lower extremity pain.

## 2023-08-01 NOTE — Assessment & Plan Note (Signed)
 Notes significant improvement since starting Ritual supplement brand pre/probiotic.

## 2023-08-05 ENCOUNTER — Other Ambulatory Visit: Payer: Self-pay | Admitting: Family Medicine

## 2023-08-05 ENCOUNTER — Encounter: Payer: Self-pay | Admitting: Family Medicine

## 2023-08-05 DIAGNOSIS — F411 Generalized anxiety disorder: Secondary | ICD-10-CM

## 2023-08-05 NOTE — Telephone Encounter (Signed)
 Name of Medication:  Lorazepam  Name of Pharmacy:  [facility] Last Fill or Written Date and Quantity:   Last Office Visit and Type:  07/31/23, 2 mo f/u Next Office Visit and Type:  10/14/23, CPE Last Controlled Substance Agreement Date:  06/10/13 Last UDS: 06/10/13, scanned

## 2023-08-07 MED ORDER — LORAZEPAM 1 MG PO TABS: 0.5000 mg | ORAL_TABLET | Freq: Two times a day (BID) | ORAL | 1 refills | Status: DC | PRN

## 2023-08-12 ENCOUNTER — Ambulatory Visit (INDEPENDENT_AMBULATORY_CARE_PROVIDER_SITE_OTHER): Payer: Medicare Other

## 2023-08-12 VITALS — BP 120/78 | Ht 67.0 in | Wt 198.0 lb

## 2023-08-12 DIAGNOSIS — Z0000 Encounter for general adult medical examination without abnormal findings: Secondary | ICD-10-CM | POA: Diagnosis not present

## 2023-08-12 NOTE — Progress Notes (Signed)
 Because this visit was a virtual/telehealth visit,  certain criteria was not obtained, such a blood pressure, CBG if applicable, and timed get up and go. Any medications not marked as taking were not mentioned during the medication reconciliation part of the visit. Any vitals not documented were not able to be obtained due to this being a telehealth visit or patient was unable to self-report a recent blood pressure reading due to a lack of equipment at home via telehealth. Vitals that have been documented are verbally provided by the patient.  This visit was performed by a medical professional under my direct supervision. I was immediately available for consultation/collaboration. I have reviewed and agree with the Annual Wellness Visit documentation.  Subjective:   Karen Aguilar is a 58 y.o. who presents for a Medicare Wellness preventive visit.  As a reminder, Annual Wellness Visits don't include a physical exam, and some assessments may be limited, especially if this visit is performed virtually. We may recommend an in-person follow-up visit with your provider if needed.  Visit Complete: Virtual I connected with  Karen Aguilar on 08/12/23 by a audio enabled telemedicine application and verified that I am speaking with the correct person using two identifiers.  Patient Location: Home  Provider Location: Home Office  I discussed the limitations of evaluation and management by telemedicine. The patient expressed understanding and agreed to proceed.  Vital Signs: Because this visit was a virtual/telehealth visit, some criteria may be missing or patient reported. Any vitals not documented were not able to be obtained and vitals that have been documented are patient reported.  VideoDeclined- This patient declined Librarian, academic. Therefore the visit was completed with audio only.  Persons Participating in Visit: Patient.  AWV Questionnaire: No: Patient Medicare  AWV questionnaire was not completed prior to this visit.  Cardiac Risk Factors include: advanced age (>52men, >50 women);hypertension     Objective:    Today's Vitals   08/12/23 1355  BP: 120/78  Weight: 198 lb (89.8 kg)  Height: 5' 7 (1.702 m)   Body mass index is 31.01 kg/m.     08/12/2023    1:55 PM 05/19/2023    2:22 PM 08/07/2022   11:01 AM 06/02/2021    2:21 PM 04/22/2021    6:06 PM 02/20/2021    3:04 PM 07/20/2020   12:03 PM  Advanced Directives  Does Patient Have a Medical Advance Directive? No No Yes Yes No Yes No  Type of Best boy of [place];Living will Healthcare Power of [place];Living will  Living will   Does patient want to make changes to medical advance directive?   No - Patient declined      Copy of Healthcare Power of Attorney in Chart?   Yes - validated most recent copy scanned in chart (See row information) Yes - validated most recent copy scanned in chart (See row information)     Would patient like information on creating a medical advance directive? No - Patient declined No - Patient declined    No - Patient declined No - Patient declined    Current Medications (verified) Outpatient Encounter Medications as of 08/12/2023  Medication Sig   Ascorbic Acid  (VITAMIN C  PO) Take 1 tablet by mouth as needed (for immune system support).    Bacillus Coagulans-Inulin (PROBIOTIC-PREBIOTIC PO) Take 1 capsule by mouth daily.   baclofen  (LIORESAL ) 10 MG tablet Take 0.5 tablets (5 mg total) by mouth 3 (three) times daily as  needed.   calcium  carbonate (TUMS EX) 750 MG chewable tablet Chew 2-3 tablets by mouth 2 (two) times daily as needed.   camphor-menthol  (SARNA) lotion Apply 1 application topically at bedtime as needed for itching.    Carboxymethylcellulose Sodium (EYE DROPS OP) Apply to eye. PRGF eye drops. 1 drop 4-6 times a day in left eye   diclofenac  sodium (VOLTAREN ) 1 % GEL APPLY THIN LAYER EXTERNALLY TO THE AFFECTED AREA UP TO FOUR  TIMES DAILY AS NEEDED   docusate sodium  (COLACE) 100 MG capsule Take 100 mg by mouth 2 (two) times daily. As needed   LORazepam  (ATIVAN ) 1 MG tablet Take 0.5 tablets (0.5 mg total) by mouth 2 (two) times daily as needed for anxiety or sleep.   NUCYNTA  ER 250 MG TB12 SMARTSIG:1 Tablet(s) By Mouth Every 12 Hours   ondansetron  (ZOFRAN -ODT) 4 MG disintegrating tablet Take 1 tablet (4 mg total) by mouth every 8 (eight) hours as needed for nausea or vomiting.   tacrolimus  (PROTOPIC ) 0.03 % ointment Apply to eye. Custom compound   thyroid  (ARMOUR) 90 MG tablet Take 1 tablet (90 mg total) by mouth daily. One day a week take 1/2 tablet   TYRVAYA  0.03 MG/ACT SOLN Place 1 spray into both nostrils 2 (two) times daily.   No facility-administered encounter medications on file as of 08/12/2023.    Allergies (verified) Augmentin  [amoxicillin -pot clavulanate], Fluconazole , Erythromycin, Influenza vaccines, Levothyroxine , Thimerosal (thiomersal), Tomato, Atogepant, Atorvastatin , Adhesive [tape], Azithromycin, Gabapentin, Imitrex [sumatriptan], Lyrica [pregabalin], Macrodantin  [nitrofurantoin ], Macrolides and ketolides, Nickel, Sulfa antibiotics, Sulfasalazine, Troleandomycin, and Vancomycin    History: Past Medical History:  Diagnosis Date   Abnormal MRI scan, bone 2012   abnormal marrow signal humeral head, glenoid and scapula - diffuse replacement of fatty marrow - likely benign, no need for further investigation ([place])   Acute gastroenteropathy due to Norovirus 02/19/2021   Agnosia for temperature    Right Leg   Anemia    etiology unknown-iron infusion in past,last 9/12   Anxiety    Anxiety and depression    Arachnoiditis    S1 nerve root, 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 Orlando [facility]   COVID-19 virus infection 09/2018   DDD (degenerative disc disease), cervical    DDD (degenerative disc disease), lumbar    s/p permanent nerve damage after back surgery    Depression    Depression with anxiety    Diverticulosis    Eczema    Family history of adverse reaction to anesthesia    Mother - nausea   Fibromyalgia    Foot drop    right - numbness, tingling - twisting   Gallstones    GERD (gastroesophageal reflux disease)    rare zantac  PRN N/V at times with this   Headache(784.0)    migraines   Hemorrhoid    Hemorrhoids    History of kidney stones    Hyperlipidemia    Hypothyroidism 1990s   hashimoto's thyroiditis   IBS (irritable bowel syndrome)    IBS (irritable bowel syndrome)    Incontinence of urine    s/p sling procedure-unresolved   Iron deficiency    h/o anemia, s/p iron infusion (2012)   Liver hemangioma     x2   Memory change    due to medications   Migraines    and h/o optical migraine   Migraines    Mixed urge and stress incontinence 2008   extensive workup including urodynamics, failed  miltiple anticholinergics including myrbetriq and urogesic blue s/p PTNS, normal cystoscopy 2014, seen by Dr Gaston and Dr Penne   Neurogenic bladder 2008   after 13 spine surgeries with periph neuropathy and chronic LBP   Neuropathy of lower extremity 2011   Nocturia    Notalgia paresthetica    Osteoarthritis 12/2013   PONV (postoperative nausea and vomiting)    Pulmonary nodule 04/2012   7.67mm RLL nodule - resolved on imaging 12/2012   Sleep apnea    mild-doesn't use cpap   Urine frequency    Vitamin D  deficiency    Past Surgical History:  Procedure Laterality Date   ANTERIOR CERVICAL DECOMP/DISCECTOMY FUSION     C3, 4, 5 - improved after surgery   ANTERIOR CERVICAL DECOMP/DISCECTOMY FUSION  07/2017   C6/7 Dr Samule   ARTERY BIOPSY Left 09/02/2019   Procedure: BIOPSY TEMPORAL ARTERY;  Surgeon: Rodolph Romano, MD;  Location: [facility] ORS;  Service: General;  Laterality: Left;   BACK SURGERY     multiple-L4-s1,hardware,fusion,removal,infection s/p 5/11 procedure   BACK SURGERY  02/16/2011   my 7th back OR;  Procedure: Exploration of fusion removal of hardware L4-S1, harvesting of right iliac crest bone graft, redo posterior lateral fusion L4-5 using iliac crest bone graft BMP and master graft replacement of bilateral L4 screws with a Telfa tech 7.5 x 40 mm screw on the right and 8.5 x 40 mm screw on the left placement of a large Hemovac drain   CESAREAN SECTION  1996; 2001   x 2   CHOLECYSTECTOMY  2001   COLONOSCOPY  10/2016   rpt 10 yrs Jon[place])   COLONOSCOPY  03/2019   1 polyp, biopsies taken ([place])   ESOPHAGEAL MANOMETRY  06/2017   WNL (Dr Willma)   ESOPHAGOGASTRODUODENOSCOPY  03/2013   early esophageal stricture dilated, o/w WNL [place])   ESOPHAGOGASTRODUODENOSCOPY  12/2014   WNL, s/p empiric dilation of esophagus O[place]ta)   ESOPHAGOGASTRODUODENOSCOPY  10/2015   WNL, biopsies WNL as well (minimal esophagitis with reactive changes) Verlee)   ESOPHAGOGASTRODUODENOSCOPY  10/2016   biopsy suggestive of ulcer ([place])   ESOPHAGOGASTRODUODENOSCOPY  03/2019   mild portal hypertensive gastropathy, gastric erosion without bleeding, duodenal diverticulum ([place])   ESOPHAGOGASTRODUODENOSCOPY  09/2020   normal esophagus with mild edema, no gastritis, H pylori neg (Dr Tanda at [facility])   ESOPHAGOGASTRODUODENOSCOPY  08/31/2020   biopsy with reactive gastropathy ([facility])   HEMORRHOID BANDING     LASIK Bilateral    LUMBAR WOUND DEBRIDEMENT  03/15/2011   Procedure: LUMBAR WOUND DEBRIDEMENT;  Surgeon: Arley SHAUNNA Helling, MD;  Lumbar Wound Debridement   MANDIBLE RECONSTRUCTION  1986   PFT  01/28/2013   WNL   PUBOVAGINAL SLING  2008   midurethral sling   SHOULDER CLOSED REDUCTION Left 12/18/2010   Procedure: CLOSED MANIPULATION SHOULDER;  Surgeon: Reyes JAYSON Billing; labrial debridement   SPINAL CORD STIMULATOR INSERTION N/A 08/19/2015   Procedure: LUMBAR SPINAL CORD STIMULATOR INSERTION;  Surgeon: Fairy Levels, MD;  Location: [facility];  Service: Neurosurgery;  Laterality: N/A;  LUMBAR SPINAL CORD  STIMULATOR INSERTION   SPINE SURGERY     TONSILLECTOMY  1972   as a child; then they grew back   Family History  Problem Relation Age of Onset   Thyroid  disease Mother        ?empty sella   Irritable bowel syndrome Mother    Dysphagia Mother    Lung cancer Father 58        (smoker)  Diabetes Brother    Alzheimer's disease Maternal Grandmother    CAD Maternal Grandfather 25       MI   Heart disease Maternal Grandfather    Heart attack Maternal Grandfather    Colon cancer Paternal Grandmother    Lung cancer Paternal Grandfather         (smoker)   Diabetes Daughter    Breast cancer Maternal Aunt    Thyroid  disease Cousin    Cancer Other        unsure (ovarian/uterine)   Social History   Socioeconomic History   Marital status: Married    Spouse name: Not on file   Number of children: 2   Years of education: Not on file   Highest education level: Associate degree: occupational, Scientist, product/process development, or vocational program  Occupational History   Occupation: disabled  Tobacco Use   Smoking status: Never    Passive exposure: Past (as a child)   Smokeless tobacco: Never  Vaping Use   Vaping status: Never Used  Substance and Sexual Activity   Alcohol  use: Not Currently    Alcohol /week: 1.0 standard drink of alcohol     Types: 1 Glasses of wine per week    Comment: rarely drinks wine   Drug use: Yes    Comment: prescribed meds for back   Sexual activity: Not Currently  Other Topics Concern   Not on file  Social History Narrative   Lives with husband and 2 daughters)   Daughter of Thersia Seals   Occupation: was Chemical engineer   Disability since 2011 - arachnoiditis   Edu: associate's degree   Activity: severe limitations 2/2 arachnoiditis and chronic pain, walks to end of driveway and back   Diet: good water, fruits/vegetables daily   Social Drivers of Corporate investment banker Strain: Low Risk  (07/30/2023)   Overall Financial Resource Strain (CARDIA)     Difficulty of Paying Living Expenses: Not hard at all  Food Insecurity: No Food Insecurity (08/12/2023)   Hunger Vital Sign    Worried About Running Out of Food in the Last Year: Never true    Ran Out of Food in the Last Year: Never true  Transportation Needs: No Transportation Needs (08/12/2023)   PRAPARE - Administrator, Civil Service (Medical): No    Lack of Transportation (Non-Medical): No  Physical Activity: Patient Declined (08/12/2023)   Exercise Vital Sign    Days of Exercise per Week: Patient declined    Minutes of Exercise per Session: Patient declined  Recent Concern: Physical Activity - Inactive (07/30/2023)   Exercise Vital Sign    Days of Exercise per Week: 0 days    Minutes of Exercise per Session: Not on file  Stress: Stress Concern Present (08/12/2023)   [employer] of Occupational Health - Occupational Stress Questionnaire    Feeling of Stress: Very much  Social Connections: Moderately Isolated (08/12/2023)   Social Connection and Isolation Panel    Frequency of Communication with Friends and Family: More than three times a week    Frequency of Social Gatherings with Friends and Family: Once a week    Attends Religious Services: Patient declined    Database administrator or Organizations: No    Attends Banker Meetings: Never    Marital Status: Married    Tobacco Counseling Counseling given: Not Answered    Clinical Intake:  Pre-visit preparation completed: Yes  Pain : No/denies pain     BMI -  recorded: 31.01 Nutritional Status: BMI > 30  Obese Nutritional Risks: None Diabetes: No  Lab Results  Component Value Date   HGBA1C 5.6 02/27/2017     How often do you need to have someone help you when you read instructions, pamphlets, or other written materials from your doctor or pharmacy?: 1 - Never  Interpreter Needed?: No  Information entered by :: Alayah Knouff,CMA   Activities of Daily Living     08/12/2023     1:57 PM 05/20/2023    9:00 AM  In your present state of health, do you have any difficulty performing the following activities:  Hearing? 0 0  Vision? 0 0  Difficulty concentrating or making decisions? 1 0  Walking or climbing stairs? 0   Dressing or bathing? 0   Doing errands, shopping? 0   Preparing Food and eating ? N   Using the Toilet? N   In the past six months, have you accidently leaked urine? Y   Do you have problems with loss of bowel control? Y   Managing your Medications? Y   Managing your Finances? Y   Housekeeping or managing your Housekeeping? N     Patient Care Team: Rilla Baller, MD as PCP - General (Family Medicine) Unice Pac, MD as Consulting Physician (Neurosurgery) Onetha Kuba, MD as Consulting Physician (Neurosurgery) Fleeta Smock, Lamar BROCKS, MD as Consulting Physician (Allergy and Immunology) Damian Therisa HERO, MD as Physician Assistant (Internal Medicine) Orlando Anes, MD as Consulting Physician (Anesthesiology)  I have updated your Care Teams any recent Medical Services you may have received from other providers in the past year.     Assessment:   This is a routine wellness examination for Zianne.  Hearing/Vision screen Hearing Screening - Comments:: Patient wears hearing aids Vision Screening - Comments:: Patient wears glasses    Goals Addressed             This Visit's Progress    Patient Stated   On track    Remain active       Depression Screen     08/12/2023    1:59 PM 07/31/2023   12:39 PM 06/21/2023   11:00 AM 06/11/2023   10:50 AM 06/06/2023   10:30 AM 05/31/2023   11:36 AM 05/28/2023    1:15 PM  PHQ 2/9 Scores  PHQ - 2 Score 5 6 2 2 2 6 3   PHQ- 9 Score 12 18 12 13 13 20 5     Fall Risk     08/12/2023    1:57 PM 05/31/2023   11:36 AM 05/22/2023    1:02 PM 04/12/2023    8:37 AM 02/11/2023    3:10 PM  Fall Risk   Falls in the past year? 0 0 0 0 0  Number falls in past yr: 0      Injury with Fall? 0      Risk for fall  due to : No Fall Risks      Follow up Falls evaluation completed        MEDICARE RISK AT HOME:  Medicare Risk at Home Any stairs in or around the home?: No If so, are there any without handrails?: No Home free of loose throw rugs in walkways, pet beds, electrical cords, etc?: Yes Adequate lighting in your home to reduce risk of falls?: Yes Life alert?: No Use of a cane, walker or w/c?: No Grab bars in the bathroom?: Yes Shower chair or bench in shower?: Yes Elevated toilet seat  or a handicapped toilet?: No  TIMED UP AND GO:  Was the test performed?  No  Cognitive Function: 6CIT completed    10/12/2016   11:00 AM 10/05/2015   11:00 AM  MMSE - Mini Mental State Exam  Orientation to time 5  5   Orientation to Place 5  5   Registration 3  3   Attention/ Calculation 0  0   Recall 1  3   Recall-comments unable to recall 2 of 3 words    Language- name 2 objects 0  0   Language- repeat 1 1  Language- follow 3 step command 3  3   Language- read & follow direction 0  0   Write a sentence 0  0   Copy design 0  0   Total score 18  20      Data saved with a previous flowsheet row definition        08/12/2023    2:00 PM 08/07/2022   11:05 AM 06/02/2021    2:28 PM  6CIT Screen  What Year? 0 points 0 points 0 points  What month? 0 points 0 points 0 points  What time? 0 points 0 points 0 points  Count back from 20 0 points 0 points 0 points  Months in reverse 0 points 0 points 0 points  Repeat phrase 0 points 0 points 4 points  Total Score 0 points 0 points 4 points    Immunizations  There is no immunization history on file for this patient.  Screening Tests Health Maintenance  Topic Date Due   Pneumococcal Vaccine: 50+ Years (1 of 2 - PCV) Never done   Hepatitis B Vaccines (1 of 3 - 19+ 3-dose series) Never done   Zoster Vaccines- Shingrix (1 of 2) Never done   COLON CANCER SCREENING ANNUAL FOBT  07/07/2023   Cervical Cancer Screening (HPV/Pap Cotest)  08/18/2023    COVID-19 Vaccine (1 - 2024-25 season) 10/26/2023 (Originally 09/02/2022)   DTaP/Tdap/Td (1 - Tdap) 10/13/2026 (Originally 05/01/1985)   Medicare Annual Wellness (AWV)  08/11/2024   MAMMOGRAM  11/19/2024   Colonoscopy  03/09/2029   Hepatitis C Screening  Completed   HIV Screening  Completed   HPV VACCINES  Aged Out   Meningococcal B Vaccine  Aged Out    Health Maintenance  Health Maintenance Due  Topic Date Due   Pneumococcal Vaccine: 50+ Years (1 of 2 - PCV) Never done   Hepatitis B Vaccines (1 of 3 - 19+ 3-dose series) Never done   Zoster Vaccines- Shingrix (1 of 2) Never done   COLON CANCER SCREENING ANNUAL FOBT  07/07/2023   Cervical Cancer Screening (HPV/Pap Cotest)  08/18/2023   Health Maintenance Items Addressed:patient will discuss later   Additional Screening:  Vision Screening: Recommended annual ophthalmology exams for early detection of glaucoma and other disorders of the eye. Would you like a referral to an eye doctor? No    Dental Screening: Recommended annual dental exams for proper oral hygiene  Community Resource Referral / Chronic Care Management: CRR required this visit?  No   CCM required this visit?  No   Plan:    I have personally reviewed and noted the following in the patient's chart:   Medical and social history Use of alcohol , tobacco or illicit drugs  Current medications and supplements including opioid prescriptions. Patient is not currently taking opioid prescriptions. Functional ability and status Nutritional status Physical activity Advanced directives List of other physicians Hospitalizations,  surgeries, and ER visits in previous 12 months Vitals Screenings to include cognitive, depression, and falls Referrals and appointments  In addition, I have reviewed and discussed with patient certain preventive protocols, quality metrics, and best practice recommendations. A written personalized care plan for preventive services as well as  general preventive health recommendations were provided to patient.   Lyle MARLA Right, [place]   08/12/2023   After Visit Summary: (MyChart) Due to this being a telephonic visit, the after visit summary with patients personalized plan was offered to patient via MyChart   Notes: Nothing significant to report at this time.

## 2023-08-12 NOTE — Patient Instructions (Signed)
 Ms. Karen Aguilar , Thank you for taking time out of your busy schedule to complete your Annual Wellness Visit with me. I enjoyed our conversation and look forward to speaking with you again next year. I, as well as your care team,  appreciate your ongoing commitment to your health goals. Please review the following plan we discussed and let me know if I can assist you in the future. Your Game plan/ To Do List    Referrals: If you haven't heard from the office you've been referred to, please reach out to them at the phone provided.   Follow up Visits: We will see or speak with you next year for your Next Medicare AWV with our clinical staff Have you seen your provider in the last 6 months (3 months if uncontrolled diabetes)? Yes  Clinician Recommendations:  Aim for 30 minutes of exercise or brisk walking, 6-8 glasses of water, and 5 servings of fruits and vegetables each day.       This is a list of the screenings recommended for you:  Health Maintenance  Topic Date Due   Pneumococcal Vaccine for age over 69 (1 of 2 - PCV) Never done   Hepatitis B Vaccine (1 of 3 - 19+ 3-dose series) Never done   Zoster (Shingles) Vaccine (1 of 2) Never done   Stool Blood Test  07/07/2023   Pap with HPV screening  08/18/2023   COVID-19 Vaccine (1 - 2024-25 season) 10/26/2023*   DTaP/Tdap/Td vaccine (1 - Tdap) 10/13/2026*   Medicare Annual Wellness Visit  08/11/2024   Mammogram  11/19/2024   Colon Cancer Screening  03/09/2029   Hepatitis C Screening  Completed   HIV Screening  Completed   HPV Vaccine  Aged Out   Meningitis B Vaccine  Aged Out  *Topic was postponed. The date shown is not the original due date.    Advanced directives: (In Chart) A copy of your advanced directives are scanned into your chart should your provider ever need it. Advance Care Planning is important because it:  [x]  Makes sure you receive the medical care that is consistent with your values, goals, and preferences  [x]  It  provides guidance to your family and loved ones and reduces their decisional burden about whether or not they are making the right decisions based on your wishes.  Follow the link provided in your after visit summary or read over the paperwork we have mailed to you to help you started getting your Advance Directives in place. If you need assistance in completing these, please reach out to us  so that we can help you!  See attachments for Preventive Care and Fall Prevention Tips.

## 2023-08-20 ENCOUNTER — Encounter: Payer: Self-pay | Admitting: Family Medicine

## 2023-08-20 ENCOUNTER — Ambulatory Visit: Admitting: Family Medicine

## 2023-08-20 VITALS — BP 128/78 | HR 88 | Temp 98.1°F | Ht 67.0 in | Wt 197.0 lb

## 2023-08-20 DIAGNOSIS — G4733 Obstructive sleep apnea (adult) (pediatric): Secondary | ICD-10-CM | POA: Diagnosis not present

## 2023-08-20 DIAGNOSIS — G43109 Migraine with aura, not intractable, without status migrainosus: Secondary | ICD-10-CM

## 2023-08-20 DIAGNOSIS — M5134 Other intervertebral disc degeneration, thoracic region: Secondary | ICD-10-CM | POA: Diagnosis not present

## 2023-08-20 DIAGNOSIS — M5030 Other cervical disc degeneration, unspecified cervical region: Secondary | ICD-10-CM | POA: Diagnosis not present

## 2023-08-20 DIAGNOSIS — R202 Paresthesia of skin: Secondary | ICD-10-CM

## 2023-08-20 MED ORDER — PREDNISONE 20 MG PO TABS
ORAL_TABLET | ORAL | 0 refills | Status: DC
Start: 2023-08-20 — End: 2023-09-04

## 2023-08-20 MED ORDER — KETOROLAC TROMETHAMINE 30 MG/ML IJ SOLN
30.0000 mg | Freq: Once | INTRAMUSCULAR | Status: AC
  Administered 2023-08-20: 30 mg via INTRAMUSCULAR

## 2023-08-20 NOTE — Progress Notes (Unsigned)
 Ph: [phone] Fax: [phone]   Patient ID: Karen Aguilar, female    DOB: Aug 26, 1965, 58 y.o.   MRN: [id number]  This visit was conducted in person.  BP 128/78   Pulse 88   Temp 98.1 F (36.7 C) (Oral)   Ht 5' 7 (1.702 m)   Wt 197 lb (89.4 kg)   LMP 10/04/2014 Comment: per labs  SpO2 99%   BMI 30.85 kg/m    CC: worsening headache  Subjective:   HPI: Karen Aguilar is a 58 y.o. female presenting on 08/20/2023 for Migraine (Increased over the last month. Pain in scalp and around eyes. Seen by pain doctor last week. )   Worsening headache over the past 1.5 months. Starts posterior head into vertex and to forehead, periorbital pain/pressure, throbbing ache, both dull and sharp. + photophobia, phonophobia, nausea, activity limiting. Anorexia.  She's been using voltaren  topical to neck and shoulders. Aleve didn't help, caused GI upset.  Tried tylenol  without effect.  She drove here herself today.   She notes increasing paresthesias to bilateral hands, equal on both sides, worse at night time. Ongoing for 1.5 months as well.  No recent imaging, no recent nerve conduction study.  H/o CTS, this feels different.  Last pan-spine CT myelogram 10/2022 through [facility] reviewed: IMPRESSION:  1. Epidural heterotopic ossification at L4 and L5 contributes to at least moderate canal stenosis at L4-L5 and L5-S1.  2. Suggestion of clumping and thickening of nerve roots in the right aspect of the thecal sac at L4.  3. No significant canal or foraminal stenosis in the cervical or lumbar spine.   H/o C6/7 ACDF and old interbody fusion at C4/5 and C5/6.  Spinal cord stimulator to thoracic spine.   Saw pain clinic last week - thinks the combination of CPAP and neck exercises are causing occipital nerve inflammation exacerbating headaches. She's stopped using CPAP for the past week due to exacerbation of headache. She also started using neck exercises from book that was recommended by pain  clinic without benefit.   Has noted scalp is very itchy since using CPAP - pain doctor told her this was body's way of manifesting pain.   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 Most recently started tacrolimus  0.03% ointment around eyes.      Relevant past medical, surgical, family and social history reviewed and updated as indicated. Interim medical history since our last visit reviewed. Allergies and medications reviewed and updated. Outpatient Medications Prior to Visit  Medication Sig Dispense Refill   Ascorbic Acid  (VITAMIN C  PO) Take 1 tablet by mouth as needed (for immune system support).      Bacillus Coagulans-Inulin (PROBIOTIC-PREBIOTIC PO) Take 1 capsule by mouth daily.     baclofen  (LIORESAL ) 10 MG tablet Take 0.5 tablets (5 mg total) by mouth 3 (three) times daily as needed.     calcium  carbonate (TUMS EX) 750 MG chewable tablet Chew 2-3 tablets by mouth 2 (two) times daily as needed.     camphor-menthol  (SARNA) lotion Apply 1 application topically at bedtime as needed for itching.      Carboxymethylcellulose Sodium (EYE DROPS OP) Apply to eye. PRGF eye drops. 1 drop 4-6 times a day in left eye     diclofenac  sodium (VOLTAREN ) 1 % GEL APPLY THIN LAYER EXTERNALLY TO THE AFFECTED AREA UP TO FOUR TIMES DAILY AS NEEDED 300 g 0   docusate sodium  (COLACE) 100 MG capsule  Take 100 mg by mouth 2 (two) times daily. As needed     LORazepam  (ATIVAN ) 1 MG tablet Take 0.5 tablets (0.5 mg total) by mouth 2 (two) times daily as needed for anxiety or sleep. 30 tablet 1   NUCYNTA  ER 250 MG TB12 SMARTSIG:1 Tablet(s) By Mouth Every 12 Hours     ondansetron  (ZOFRAN -ODT) 4 MG disintegrating tablet Take 1 tablet (4 mg total) by mouth every 8 (eight) hours as needed for nausea or vomiting. 20 tablet 0   tacrolimus  (PROTOPIC ) 0.03 % ointment Apply to eye. Custom compound     thyroid  (ARMOUR) 90 MG tablet Take 1 tablet (90 mg total)  by mouth daily. One day a week take 1/2 tablet     TYRVAYA  0.03 MG/ACT SOLN Place 1 spray into both nostrils 2 (two) times daily.     No facility-administered medications prior to visit.     Per HPI unless specifically indicated in ROS section below Review of Systems  Objective:  BP 128/78   Pulse 88   Temp 98.1 F (36.7 C) (Oral)   Ht 5' 7 (1.702 m)   Wt 197 lb (89.4 kg)   LMP 10/04/2014 Comment: per labs  SpO2 99%   BMI 30.85 kg/m   Wt Readings from Last 3 Encounters:  08/20/23 197 lb (89.4 kg)  08/12/23 198 lb (89.8 kg)  07/31/23 197 lb 2 oz (89.4 kg)      Physical Exam Vitals and nursing note reviewed.  Constitutional:      Appearance: Normal appearance. She is not ill-appearing.  HENT:     Head: Normocephalic and atraumatic.     Mouth/Throat:     Mouth: Mucous membranes are moist.     Pharynx: Oropharynx is clear. No oropharyngeal exudate or posterior oropharyngeal erythema.  Eyes:     Comments: Wearing sun glasses  Neck:     Comments: Limited cervical ROM due to discomfort  Midline cervical spine tenderness to palpation  No significant paracervical mm tenderness to palpation No significant discomfort to palpation of occipital nerves bilaterally  Cardiovascular:     Rate and Rhythm: Normal rate and regular rhythm.     Pulses: Normal pulses.     Heart sounds: Normal heart sounds. No murmur heard. Pulmonary:     Effort: Pulmonary effort is normal. No respiratory distress.     Breath sounds: Normal breath sounds. No wheezing, rhonchi or rales.  Musculoskeletal:     Cervical back: Normal range of motion and neck supple.     Right lower leg: No edema.     Left lower leg: No edema.  Skin:    General: Skin is warm and dry.     Findings: No rash.  Neurological:     General: No focal deficit present.     Mental Status: She is alert.     Comments:  Neg tinel and phalen bilaterally CN grossly intact  Psychiatric:        Mood and Affect: Mood normal.         Behavior: Behavior normal.     Comments: Tearful with discussion of recent disabling headaches         Assessment & Plan:   Problem List Items Addressed This Visit     OSA on CPAP   See above. CPAP face mask may be exacerbating cervicogenic headaches/migraines.       Complicated migraine - Primary   Anticipate CPAP mask use is exacerbating cervicogenic headaches leading to intractable migraine.  Treat  with Toradol  30mg  IM x1.  Avoid phenergan  as pt drove here. She does have zofran  to use at home if needed.  ERx prednisone  taper to fill and start taking tomorrow if HA not improved with Toradol  shot today.  She will touch base with Dr Jess sleep doctor about possible change in mask prior to upcoming appt next month.  Pt agrees with plan.       DDD (degenerative disc disease), cervical   DDD (degenerative disc disease), thoracic   Paresthesia of arm   Paresthesias to bilateral arms and hands, worse at night time, ongoing for 1.5 months.  Neg tinel/phalen, pt notes this feels different from CTS.  Known cervical DDD.  Consider updated neck imaging vs NCS - she will let me know if interested in NCS/EMG through [facility] or f/u with Dr Lane (last seen 02/2020).         Meds ordered this encounter  Medications   predniSONE  (DELTASONE ) 20 MG tablet    Sig: Take two tablets daily for 3 days followed by one tablet daily for 4 days    Dispense:  10 tablet    Refill:  0   ketorolac  (TORADOL ) 30 MG/ML injection 30 mg    No orders of the defined types were placed in this encounter.   Patient Instructions  Possible cervico-genic headache.  Toradol  30mg  shot today.  If no better with this, may take prednisone  taper printed out today.  Consider nerve conduction study through Dr Lane.   Follow up plan: Return if symptoms worsen or fail to improve.  Anton Blas, MD

## 2023-08-20 NOTE — Patient Instructions (Addendum)
 Possible cervico-genic headache.  Toradol  30mg  shot today.  If no better with this, may take prednisone  taper printed out today.  Consider nerve conduction study through Dr Lane.

## 2023-08-21 DIAGNOSIS — M5134 Other intervertebral disc degeneration, thoracic region: Secondary | ICD-10-CM | POA: Insufficient documentation

## 2023-08-21 DIAGNOSIS — R202 Paresthesia of skin: Secondary | ICD-10-CM | POA: Insufficient documentation

## 2023-08-21 NOTE — Assessment & Plan Note (Addendum)
 Paresthesias to bilateral arms and hands, worse at night time, ongoing for 1.5 months.  Neg tinel/phalen, pt notes this feels different from CTS.  Known cervical DDD.  Consider updated neck imaging vs NCS - she will let me know if interested in NCS/EMG through [facility] or f/u with Dr Lane (last seen 02/2020).

## 2023-08-21 NOTE — Assessment & Plan Note (Signed)
 See above. CPAP face mask may be exacerbating cervicogenic headaches/migraines.

## 2023-08-21 NOTE — Assessment & Plan Note (Signed)
 Anticipate CPAP mask use is exacerbating cervicogenic headaches leading to intractable migraine.  Treat with Toradol  30mg  IM x1.  Avoid phenergan  as pt drove here. She does have zofran  to use at home if needed.  ERx prednisone  taper to fill and start taking tomorrow if HA not improved with Toradol  shot today.  She will touch base with Dr Jess sleep doctor about possible change in mask prior to upcoming appt next month.  Pt agrees with plan.

## 2023-08-27 ENCOUNTER — Encounter: Payer: Self-pay | Admitting: Sleep Medicine

## 2023-08-30 ENCOUNTER — Encounter: Payer: Self-pay | Admitting: Family Medicine

## 2023-09-04 ENCOUNTER — Encounter: Payer: Self-pay | Admitting: Sleep Medicine

## 2023-09-04 ENCOUNTER — Ambulatory Visit: Admitting: Sleep Medicine

## 2023-09-04 VITALS — BP 132/98 | HR 97 | Temp 97.7°F | Ht 67.0 in | Wt 200.2 lb

## 2023-09-04 DIAGNOSIS — G4733 Obstructive sleep apnea (adult) (pediatric): Secondary | ICD-10-CM

## 2023-09-04 DIAGNOSIS — I10 Essential (primary) hypertension: Secondary | ICD-10-CM | POA: Diagnosis not present

## 2023-09-04 NOTE — Progress Notes (Unsigned)
 Name:Karen Aguilar MRN: [id number] DOB: 06/14/1965   CHIEF COMPLAINT:  EXCESSIVE DAYTIME SLEEPINESS   HISTORY OF PRESENT ILLNESS: Karen Aguilar is a 58 y.o. w/ a h/o who present for c/o loud snoring and excessive daytime sleepiness which has been present for several years. Reports nocturnal awakenings due to unclear reasons, however does not have difficulty falling back to sleep. Denies any significant weight changes. Denies morning headaches, RLS symptoms, dream enactment, cataplexy, hypnagogic or hypnapompic hallucinations. Reports a family history of sleep apnea. Denies drowsy driving. Drinks 1-2 sodas daily, occasional alcohol  use, former smoker, denies illicit drug use.   Bedtime 11 pm Sleep onset 10 mins Rise time 6:30-7:30 am   EPWORTH SLEEP SCORE    06/04/2023    2:00 PM  Results of the Epworth flowsheet  Sitting and reading 0  Watching TV 1  Sitting, inactive in a public place (e.g. a theatre or a meeting) 0  As a passenger in a car for an hour without a break 1  Lying down to rest in the afternoon when circumstances permit 2  Sitting and talking to someone 0  Sitting quietly after a lunch without alcohol  1  In a car, while stopped for a few minutes in traffic 0  Total score 5      PAST MEDICAL HISTORY :   has a past medical history of Abnormal MRI scan, bone (2012), Acute gastroenteropathy due to Norovirus (02/19/2021), Agnosia for temperature, Anemia, Anxiety, Anxiety and depression, Arachnoiditis, Arthritis, Chronic pain, COVID-19 virus infection (09/2018), DDD (degenerative disc disease), cervical, DDD (degenerative disc disease), lumbar, Depression, Depression with anxiety, Diverticulosis, Eczema, Family history of adverse reaction to anesthesia, Fibromyalgia, Foot drop, Gallstones, GERD (gastroesophageal reflux disease), Headache(784.0), Hemorrhoid, Hemorrhoids, History of kidney stones, Hyperlipidemia, Hypothyroidism (1990s), IBS (irritable bowel syndrome), IBS  (irritable bowel syndrome), Incontinence of urine, Iron deficiency, Liver hemangioma, Memory change, Migraines, Migraines, Mixed urge and stress incontinence (2008), Neurogenic bladder (2008), Neuropathy of lower extremity (2011), Nocturia, Notalgia paresthetica, Osteoarthritis (12/2013), PONV (postoperative nausea and vomiting), Pulmonary nodule (04/2012), Sleep apnea, Urine frequency, and Vitamin D  deficiency.  has a past surgical history that includes LASIK (Bilateral); Cholecystectomy (2001); Pubovaginal sling (2008); Shoulder Closed Reduction (Left, 12/18/2010); Back surgery; Back surgery (02/16/2011); Anterior cervical decomp/discectomy fusion; Cesarean section (1996; 2001); Lumbar wound debridement (03/15/2011); Spine surgery; PFT (01/28/2013); Esophagogastroduodenoscopy (03/2013); Esophagogastroduodenoscopy (12/2014); Tonsillectomy (1972); Mandible reconstruction (1986); Colonoscopy (10/2016); Hemorrhoid banding; Spinal cord stimulator insertion (N/A, 08/19/2015); Esophagogastroduodenoscopy (10/2015); Esophagogastroduodenoscopy (10/2016); Esophageal manometry (06/2017); Anterior cervical decomp/discectomy fusion (07/2017); Colonoscopy (03/2019); Esophagogastroduodenoscopy (03/2019); Artery Biopsy (Left, 09/02/2019); Esophagogastroduodenoscopy (09/2020); and Esophagogastroduodenoscopy (08/31/2020). Prior to Admission medications   Medication Sig Start Date End Date Taking? Authorizing Provider  Ascorbic Acid  (VITAMIN C  PO) Take 1 tablet by mouth as needed (for immune system support).    Yes [provider]  Bacillus Coagulans-Inulin (PROBIOTIC-PREBIOTIC PO) Take 1 capsule by mouth daily. 07/15/23  Yes [provider]  baclofen  (LIORESAL ) 10 MG tablet Take 0.5 tablets (5 mg total) by mouth 3 (three) times daily as needed. 07/14/21  Yes Cleatus Arlyss RAMAN, MD  calcium  carbonate (TUMS EX) 750 MG chewable tablet Chew 2-3 tablets by mouth 2 (two) times daily as needed.   Yes [provider]  camphor-menthol  (SARNA) lotion Apply 1 application topically at bedtime as needed for itching.    Yes [provider]  Carboxymethylcellulose Sodium (EYE DROPS OP) Apply to eye. PRGF eye drops. 1 drop 4-6 times a day in left eye  Yes [provider]  diclofenac  sodium (VOLTAREN ) 1 % GEL APPLY THIN LAYER EXTERNALLY TO THE AFFECTED AREA UP TO FOUR TIMES DAILY AS NEEDED 12/31/17  Yes Vernetta Lonni GRADE, MD  docusate sodium  (COLACE) 100 MG capsule Take 100 mg by mouth 2 (two) times daily. As needed   Yes [provider]  LORazepam  (ATIVAN ) 1 MG tablet Take 0.5 tablets (0.5 mg total) by mouth 2 (two) times daily as needed for anxiety or sleep. 08/07/23  Yes Rilla Baller, MD  NUCYNTA  ER 250 MG TB12 SMARTSIG:1 Tablet(s) By Mouth Every 12 Hours 07/13/21  Yes [provider]  ondansetron  (ZOFRAN -ODT) 4 MG disintegrating tablet Take 1 tablet (4 mg total) by mouth every 8 (eight) hours as needed for nausea or vomiting. 05/22/23  Yes Rilla Baller, MD  tacrolimus  (PROTOPIC ) 0.03 % ointment Apply to eye. Custom compound 03/20/23 03/19/24 Yes [provider]  thyroid  (ARMOUR) 90 MG tablet Take 1 tablet (90 mg total) by mouth daily. One day a week take 1/2 tablet 12/02/20  Yes Rilla Baller, MD  TYRVAYA  0.03 MG/ACT SOLN Place 1 spray into both nostrils 2 (two) times daily.   Yes [provider]   Allergies  Allergen Reactions   Augmentin  [Amoxicillin -Pot Clavulanate] Swelling    Lip swelling and irritation.    Fluconazole  Swelling    Lips - swelling, bleeding, blisters, cracking, peeling   Erythromycin Nausea And Vomiting   Influenza Vaccines Hives and Swelling    Redness, heat, and extreme swelling at site   Levothyroxine  Hives   Thimerosal (Thiomersal) Swelling    Local reaction-redness,swelling; veins popping   Tomato Other (See Comments)    Severe acid reflux with all acid foods   Atogepant Other (See Comments)    Gi  symptoms   Atorvastatin  Other (See Comments)    GI upset, even once weekly dosing   Adhesive [Tape] Rash    blisters   Azithromycin Rash   Gabapentin Other (See Comments)    Short term memory loss   Imitrex [Sumatriptan] Other (See Comments)    Rebound migraine    Lyrica [Pregabalin] Other (See Comments)    Short term memory loss   Macrodantin  [Nitrofurantoin ] Rash   Macrolides And Ketolides Rash   Nickel Swelling and Rash   Sulfa Antibiotics Rash   Sulfasalazine Rash   Troleandomycin Rash   Vancomycin  Itching and Rash    Developed Redman's, may require premedication and infusion rate reduction in the future    FAMILY HISTORY:  family history includes Alzheimer's disease in her maternal grandmother; Breast cancer in her maternal aunt; CAD (age of onset: 42) in her maternal grandfather; Cancer in her father, maternal aunt, maternal aunt, paternal grandfather, paternal grandmother, and another family member; Colon cancer in her paternal grandmother; Diabetes in her brother and daughter; Dysphagia in her mother; Heart attack in her maternal grandfather; Heart disease in her maternal grandfather; Irritable bowel syndrome in her mother; Lung cancer in her paternal grandfather; Lung cancer (age of onset: 96) in her father; Thyroid  disease in her cousin and mother. SOCIAL HISTORY:  reports that she has never smoked. She has been exposed to tobacco smoke. She has never used smokeless tobacco. She reports that she does not currently use alcohol  after a past usage of about 1.0 standard drink of alcohol  per week. She reports current drug use.   Review of Systems:  Gen:  Denies  fever, sweats, chills weight loss  HEENT: Denies blurred vision, double vision, ear pain, eye pain,  hearing loss, nose bleeds, sore throat Cardiac:  No dizziness, chest pain or heaviness, chest tightness,edema, No JVD Resp:   No cough, -sputum production, -shortness of breath,-wheezing, -hemoptysis,  Gi: Denies  swallowing difficulty, stomach pain, nausea or vomiting, diarrhea, constipation, bowel incontinence Gu:  Denies bladder incontinence, burning urine Ext:   Denies Joint pain, stiffness or swelling Skin: Denies  skin rash, easy bruising or bleeding or hives Endoc:  Denies polyuria, polydipsia , polyphagia or weight change Psych:   Denies depression, insomnia or hallucinations  Other:  All other systems negative  VITAL SIGNS: BP (!) 132/98   Pulse 97   Temp 97.7 F (36.5 C) (Temporal)   Ht 5' 7 (1.702 m)   Wt 200 lb 3.2 oz (90.8 kg)   LMP 10/04/2014 Comment: per labs  SpO2 99%   BMI 31.36 kg/m    Physical Examination:   General Appearance: No distress  EYES PERRLA, EOM intact.   NECK Supple, No JVD Pulmonary: normal breath sounds, No wheezing.  CardiovascularNormal S1,S2.  No m/r/g.   Abdomen: Benign, Soft, non-tender. Skin:   warm, no rashes, no ecchymosis  Extremities: normal, no cyanosis, clubbing. Neuro:without focal findings,  speech normal  PSYCHIATRIC: Mood, affect within normal limits.   ASSESSMENT AND PLAN  OSA I suspect that OSA is likely present due to clinical presentation. Discussed the consequences of untreated sleep apnea. Advised not to drive drowsy for safety of patient and others. Will complete further evaluation with a home sleep study and follow up to review results.    HTN Stable, on current management. Following with PCP.    MEDICATION ADJUSTMENTS/LABS AND TESTS ORDERED: Recommend Sleep Study   Patient  satisfied with Plan of action and management. All questions answered  Follow up to review HST results and treatment plan.   I spent a total of  *** minutes reviewing chart data, face-to-face evaluation with the patient, counseling and coordination of care as detailed above.    Danney Bungert, M.D.  Sleep Medicine [facility]

## 2023-09-06 ENCOUNTER — Telehealth: Admitting: Family Medicine

## 2023-09-06 ENCOUNTER — Encounter: Payer: Self-pay | Admitting: Family Medicine

## 2023-09-06 VITALS — BP 139/92 | HR 105 | Temp 97.5°F | Ht 67.0 in | Wt 200.0 lb

## 2023-09-06 DIAGNOSIS — G4734 Idiopathic sleep related nonobstructive alveolar hypoventilation: Secondary | ICD-10-CM | POA: Diagnosis not present

## 2023-09-06 DIAGNOSIS — R030 Elevated blood-pressure reading, without diagnosis of hypertension: Secondary | ICD-10-CM | POA: Diagnosis not present

## 2023-09-06 DIAGNOSIS — G4733 Obstructive sleep apnea (adult) (pediatric): Secondary | ICD-10-CM | POA: Diagnosis not present

## 2023-09-06 DIAGNOSIS — H168 Other keratitis: Secondary | ICD-10-CM

## 2023-09-06 DIAGNOSIS — G43109 Migraine with aura, not intractable, without status migrainosus: Secondary | ICD-10-CM

## 2023-09-06 MED ORDER — PROPRANOLOL HCL ER 60 MG PO CP24: 60.0000 mg | ORAL_CAPSULE | Freq: Every day | ORAL | 3 refills | Status: DC

## 2023-09-06 NOTE — Assessment & Plan Note (Signed)
 Recently elevated readings noted by patient. Does improve on recheck however remaining above goal with diastolic 92.  Also with associated tachycardia.  ?related to increased headaches.

## 2023-09-06 NOTE — Assessment & Plan Note (Signed)
 Appreciate pulm care.  She's had significant trouble tolerating current CPAP mask with worsening headaches and neck pain.  Plan to try Resmed Swift FX Bella nasal pillow mask and undergo CPAP titration study to optimize settings.

## 2023-09-06 NOTE — Assessment & Plan Note (Addendum)
 Ongoing issue with increased frequency of migraines in setting of CPAP mask possibly exacerbating cervicogenic headaches. Limited treatment options given poor response to other meds tried including gabapentin, imitrex, Atogepant. She is already on baclofen  regularly so would not add flexeril .  Will start extended release propranolol  60mg  daily for migraine prevention. Reviewed risk factors to watch for, to let us  know if any trouble tolerating or not improved.  Offered return to neurology for further eval/treatment of migraines - she declines for now.

## 2023-09-06 NOTE — Progress Notes (Signed)
 Ph: [phone] Fax: [phone]   Patient ID: Karen Aguilar, female    DOB: 09-03-1965, 58 y.o.   MRN: [id number]  Virtual visit completed through MyChart, a video enabled telemedicine application. Due to national recommendations of social distancing due to COVID-19, a virtual visit is felt to be most appropriate for this patient at this time. Reviewed limitations, risks, security and privacy concerns of performing a virtual visit and the availability of in person appointments. I also reviewed that there may be a patient responsible charge related to this service. The patient agreed to proceed.   Patient location: home Provider location: [place] at [facility], office Persons participating in this virtual visit: patient, provider   If any vitals were documented, they were collected by patient at home unless specified below.    BP (!) 139/92 (BP Location: Left Arm)   Pulse (!) 105   Temp (!) 97.5 F (36.4 C) (Oral)   Ht 5' 7 (1.702 m)   Wt 200 lb (90.7 kg)   LMP 10/04/2014 Comment: per labs  SpO2 97%   BMI 31.32 kg/m   BP Readings from Last 3 Encounters:  09/06/23 (!) 139/92  09/04/23 (!) 132/98  08/20/23 128/78    CC: ongoing neck pain, migraine Subjective:   HPI: Karen Aguilar is a 58 y.o. female presenting on 09/06/2023 for Neck Pain (Pt states neck pain and migrane strarted 2 mths ago. Concerned that CPAP mask is causing it.)   See prior note for details.  Seen 08/20/2023 with exacerbation of complicated migraine associated with paresthesias to hands. Treated with IM toradol  x1 and prednisone  taper with improvement of hand paresthesias and neck ROM however had persistence of migraine symptoms. This morning did wake up with hand paresthesias again.   She notes ongoing pain across neck into bilateral shoulder blades.  She's been using voltaren  gel to the neck --> TIDL pain relief cyotherapy spray OTC with menthol  10% using TID. This has actually been helping more than  voltaren . Relief lasts a few hours. She is planning on using camphor / menthol  pain patch (Salon Pas patch).  She also continues baclofen  5mg  TID.  She also takes hot baths with epsom salt.   BP trending up.   She notes intermittent popping sound in lungs when breathing, not constant. No wheezing.  Good O2 sats present, no fevers, cough, congestion or other URI symptoms.   Ongoing migraines with nausea and vision changes, pain to scalp, into forehead and around eyes  - yesterday was a better day.      Relevant past medical, surgical, family and social history reviewed and updated as indicated. Interim medical history since our last visit reviewed. Allergies and medications reviewed and updated. Outpatient Medications Prior to Visit  Medication Sig Dispense Refill   Ascorbic Acid  (VITAMIN C  PO) Take 1 tablet by mouth as needed (for immune system support).      Bacillus Coagulans-Inulin (PROBIOTIC-PREBIOTIC PO) Take 1 capsule by mouth daily.     baclofen  (LIORESAL ) 10 MG tablet Take 0.5 tablets (5 mg total) by mouth 3 (three) times daily as needed.     calcium  carbonate (TUMS EX) 750 MG chewable tablet Chew 2-3 tablets by mouth 2 (two) times daily as needed.     camphor-menthol  (SARNA) lotion Apply 1 application topically at bedtime as needed for itching.      Carboxymethylcellulose Sodium (EYE DROPS OP) Apply to eye. PRGF eye drops. 1 drop 4-6 times a day in left eye  diclofenac  sodium (VOLTAREN ) 1 % GEL APPLY THIN LAYER EXTERNALLY TO THE AFFECTED AREA UP TO FOUR TIMES DAILY AS NEEDED 300 g 0   docusate sodium  (COLACE) 100 MG capsule Take 100 mg by mouth 2 (two) times daily. As needed     LORazepam  (ATIVAN ) 1 MG tablet Take 0.5 tablets (0.5 mg total) by mouth 2 (two) times daily as needed for anxiety or sleep. 30 tablet 1   NUCYNTA  ER 250 MG TB12 SMARTSIG:1 Tablet(s) By Mouth Every 12 Hours     ondansetron  (ZOFRAN -ODT) 4 MG disintegrating tablet Take 1 tablet (4 mg total) by mouth every 8  (eight) hours as needed for nausea or vomiting. 20 tablet 0   tacrolimus  (PROTOPIC ) 0.03 % ointment Apply to eye. Custom compound     thyroid  (ARMOUR) 90 MG tablet Take 1 tablet (90 mg total) by mouth daily. One day a week take 1/2 tablet     TYRVAYA  0.03 MG/ACT SOLN Place 1 spray into both nostrils 2 (two) times daily.     No facility-administered medications prior to visit.     Per HPI unless specifically indicated in ROS section below Review of Systems Objective:  BP (!) 139/92 (BP Location: Left Arm)   Pulse (!) 105   Temp (!) 97.5 F (36.4 C) (Oral)   Ht 5' 7 (1.702 m)   Wt 200 lb (90.7 kg)   LMP 10/04/2014 Comment: per labs  SpO2 97%   BMI 31.32 kg/m   Wt Readings from Last 3 Encounters:  09/06/23 200 lb (90.7 kg)  09/04/23 200 lb 3.2 oz (90.8 kg)  08/20/23 197 lb (89.4 kg)       Physical exam: Gen: alert, NAD, not ill appearing Pulm: speaks in complete sentences without increased work of breathing Psych: normal mood, normal thought content       Assessment & Plan:   Complicated migraine Assessment & Plan: Ongoing issue with increased frequency of migraines in setting of CPAP mask possibly exacerbating cervicogenic headaches. Limited treatment options given poor response to other meds tried including gabapentin, imitrex, Atogepant. She is already on baclofen  regularly so would not add flexeril .  Will start extended release propranolol  60mg  daily for migraine prevention. Reviewed risk factors to watch for, to let us  know if any trouble tolerating or not improved.  Offered return to neurology for further eval/treatment of migraines - she declines for now.   OSA on CPAP Assessment & Plan: Appreciate pulm care.  She's had significant trouble tolerating current CPAP mask with worsening headaches and neck pain.  Plan to try Resmed Swift FX Bella nasal pillow mask and undergo CPAP titration study to optimize settings.   Nocturnal hypoxia  Elevated blood pressure  reading without diagnosis of hypertension Assessment & Plan: Recently elevated readings noted by patient. Does improve on recheck however remaining above goal with diastolic 92.  Also with associated tachycardia.  ?related to increased headaches.    Neurotrophic keratitis of left eye  Other orders -     Propranolol  HCl ER; Take 1 capsule (60 mg total) by mouth daily. For migraine prevention  Dispense: 30 capsule; Refill: 3     I discussed the assessment and treatment plan with the patient. The patient was provided an opportunity to ask questions and all were answered. The patient agreed with the plan and demonstrated an understanding of the instructions. The patient was advised to call back or seek an in-person evaluation if the symptoms worsen or if the condition fails to improve as  anticipated.  Follow up plan: No follow-ups on file.  Anton Blas, MD

## 2023-10-01 ENCOUNTER — Encounter: Payer: Self-pay | Admitting: Family Medicine

## 2023-10-01 NOTE — Telephone Encounter (Signed)
 Last OV:  08/20/23, migraine Next OV:  10/14/23, CPE

## 2023-10-03 MED ORDER — SUCRALFATE 1 G PO TABS: 1.0000 g | ORAL_TABLET | Freq: Two times a day (BID) | ORAL | 0 refills | Status: AC

## 2023-10-06 ENCOUNTER — Other Ambulatory Visit: Payer: Self-pay | Admitting: Family Medicine

## 2023-10-06 DIAGNOSIS — E063 Autoimmune thyroiditis: Secondary | ICD-10-CM

## 2023-10-06 DIAGNOSIS — E7800 Pure hypercholesterolemia, unspecified: Secondary | ICD-10-CM

## 2023-10-06 DIAGNOSIS — E559 Vitamin D deficiency, unspecified: Secondary | ICD-10-CM

## 2023-10-06 DIAGNOSIS — M85832 Other specified disorders of bone density and structure, left forearm: Secondary | ICD-10-CM

## 2023-10-06 DIAGNOSIS — E611 Iron deficiency: Secondary | ICD-10-CM

## 2023-10-07 ENCOUNTER — Other Ambulatory Visit: Payer: Self-pay | Admitting: Family Medicine

## 2023-10-07 ENCOUNTER — Other Ambulatory Visit (INDEPENDENT_AMBULATORY_CARE_PROVIDER_SITE_OTHER)

## 2023-10-07 DIAGNOSIS — F411 Generalized anxiety disorder: Secondary | ICD-10-CM

## 2023-10-07 DIAGNOSIS — E7800 Pure hypercholesterolemia, unspecified: Secondary | ICD-10-CM | POA: Diagnosis not present

## 2023-10-07 DIAGNOSIS — M85832 Other specified disorders of bone density and structure, left forearm: Secondary | ICD-10-CM | POA: Diagnosis not present

## 2023-10-07 DIAGNOSIS — E611 Iron deficiency: Secondary | ICD-10-CM | POA: Diagnosis not present

## 2023-10-07 DIAGNOSIS — E559 Vitamin D deficiency, unspecified: Secondary | ICD-10-CM | POA: Diagnosis not present

## 2023-10-07 LAB — FERRITIN: Ferritin: 35.2 ng/mL (ref 10.0–291.0)

## 2023-10-07 LAB — CBC WITH DIFFERENTIAL/PLATELET
Basophils Absolute: 0 K/uL (ref 0.0–0.1)
Basophils Relative: 0.1 % (ref 0.0–3.0)
Eosinophils Absolute: 0 K/uL (ref 0.0–0.7)
Eosinophils Relative: 0 % (ref 0.0–5.0)
HCT: 41.9 % (ref 36.0–46.0)
Hemoglobin: 14 g/dL (ref 12.0–15.0)
Lymphocytes Relative: 34.1 % (ref 12.0–46.0)
Lymphs Abs: 1.8 K/uL (ref 0.7–4.0)
MCHC: 33.5 g/dL (ref 30.0–36.0)
MCV: 86.2 fl (ref 78.0–100.0)
Monocytes Absolute: 0.4 K/uL (ref 0.1–1.0)
Monocytes Relative: 8 % (ref 3.0–12.0)
Neutro Abs: 3.1 K/uL (ref 1.4–7.7)
Neutrophils Relative %: 57.8 % (ref 43.0–77.0)
Platelets: 268 K/uL (ref 150.0–400.0)
RBC: 4.86 Mil/uL (ref 3.87–5.11)
RDW: 12.7 % (ref 11.5–15.5)
WBC: 5.2 K/uL (ref 4.0–10.5)

## 2023-10-07 LAB — COMPREHENSIVE METABOLIC PANEL WITH GFR
ALT: 16 U/L (ref 0–35)
AST: 16 U/L (ref 0–37)
Albumin: 4.2 g/dL (ref 3.5–5.2)
Alkaline Phosphatase: 78 U/L (ref 39–117)
BUN: 9 mg/dL (ref 6–23)
CO2: 31 meq/L (ref 19–32)
Calcium: 9.7 mg/dL (ref 8.4–10.5)
Chloride: 101 meq/L (ref 96–112)
Creatinine, Ser: 0.85 mg/dL (ref 0.40–1.20)
GFR: 76.04 mL/min (ref >60.00)
Glucose, Bld: 107 mg/dL — ABNORMAL HIGH (ref 70–99)
Potassium: 4.2 meq/L (ref 3.5–5.1)
Sodium: 142 meq/L (ref 135–145)
Total Bilirubin: 0.5 mg/dL (ref 0.2–1.2)
Total Protein: 6.6 g/dL (ref 6.0–8.3)

## 2023-10-07 LAB — LIPID PANEL
Cholesterol: 238 mg/dL — ABNORMAL HIGH (ref 0–200)
HDL: 56.3 mg/dL (ref >39.00)
LDL Cholesterol: 159 mg/dL — ABNORMAL HIGH (ref 0–99)
NonHDL: 182.14
Total CHOL/HDL Ratio: 4
Triglycerides: 116 mg/dL (ref 0.0–149.0)
VLDL: 23.2 mg/dL (ref 0.0–40.0)

## 2023-10-07 LAB — VITAMIN D 25 HYDROXY (VIT D DEFICIENCY, FRACTURES): VITD: 31.36 ng/mL (ref 30.00–100.00)

## 2023-10-07 NOTE — Telephone Encounter (Signed)
 Name of Medication:  Lorazepam  Name of Pharmacy:  [facility] Last Fill or Written Date and Quantity:  94/25, #30 Last Office Visit and Type:  08/20/23, migraine Next Office Visit and Type:  none Last Controlled Substance Agreement Date:  06/10/13 Last UDS: 06/10/13, scanned

## 2023-10-08 ENCOUNTER — Ambulatory Visit: Attending: Sleep Medicine

## 2023-10-08 DIAGNOSIS — I493 Ventricular premature depolarization: Secondary | ICD-10-CM | POA: Insufficient documentation

## 2023-10-08 DIAGNOSIS — G4733 Obstructive sleep apnea (adult) (pediatric): Secondary | ICD-10-CM | POA: Insufficient documentation

## 2023-10-09 ENCOUNTER — Ambulatory Visit: Payer: Self-pay | Admitting: Family Medicine

## 2023-10-09 NOTE — Telephone Encounter (Signed)
 ERx

## 2023-10-14 ENCOUNTER — Ambulatory Visit (INDEPENDENT_AMBULATORY_CARE_PROVIDER_SITE_OTHER): Admitting: Family Medicine

## 2023-10-14 ENCOUNTER — Encounter: Payer: Self-pay | Admitting: Family Medicine

## 2023-10-14 VITALS — BP 126/82 | HR 87 | Temp 98.1°F | Ht 67.0 in | Wt 199.2 lb

## 2023-10-14 DIAGNOSIS — M85832 Other specified disorders of bone density and structure, left forearm: Secondary | ICD-10-CM

## 2023-10-14 DIAGNOSIS — G43109 Migraine with aura, not intractable, without status migrainosus: Secondary | ICD-10-CM

## 2023-10-14 DIAGNOSIS — H168 Other keratitis: Secondary | ICD-10-CM

## 2023-10-14 DIAGNOSIS — Z01419 Encounter for gynecological examination (general) (routine) without abnormal findings: Secondary | ICD-10-CM

## 2023-10-14 DIAGNOSIS — E7800 Pure hypercholesterolemia, unspecified: Secondary | ICD-10-CM | POA: Diagnosis not present

## 2023-10-14 DIAGNOSIS — H16229 Keratoconjunctivitis sicca, not specified as Sjogren's, unspecified eye: Secondary | ICD-10-CM

## 2023-10-14 DIAGNOSIS — F331 Major depressive disorder, recurrent, moderate: Secondary | ICD-10-CM

## 2023-10-14 DIAGNOSIS — F411 Generalized anxiety disorder: Secondary | ICD-10-CM

## 2023-10-14 DIAGNOSIS — D179 Benign lipomatous neoplasm, unspecified: Secondary | ICD-10-CM

## 2023-10-14 DIAGNOSIS — G500 Trigeminal neuralgia: Secondary | ICD-10-CM

## 2023-10-14 DIAGNOSIS — E063 Autoimmune thyroiditis: Secondary | ICD-10-CM

## 2023-10-14 DIAGNOSIS — M797 Fibromyalgia: Secondary | ICD-10-CM

## 2023-10-14 DIAGNOSIS — G894 Chronic pain syndrome: Secondary | ICD-10-CM | POA: Diagnosis not present

## 2023-10-14 DIAGNOSIS — Z0000 Encounter for general adult medical examination without abnormal findings: Secondary | ICD-10-CM | POA: Diagnosis not present

## 2023-10-14 DIAGNOSIS — G4733 Obstructive sleep apnea (adult) (pediatric): Secondary | ICD-10-CM

## 2023-10-14 DIAGNOSIS — K219 Gastro-esophageal reflux disease without esophagitis: Secondary | ICD-10-CM

## 2023-10-14 DIAGNOSIS — E559 Vitamin D deficiency, unspecified: Secondary | ICD-10-CM

## 2023-10-14 DIAGNOSIS — G4734 Idiopathic sleep related nonobstructive alveolar hypoventilation: Secondary | ICD-10-CM

## 2023-10-14 DIAGNOSIS — Z7189 Other specified counseling: Secondary | ICD-10-CM

## 2023-10-14 MED ORDER — PROPRANOLOL HCL ER 60 MG PO CP24: 60.0000 mg | ORAL_CAPSULE | Freq: Every day | ORAL | 3 refills | Status: AC

## 2023-10-14 NOTE — Progress Notes (Unsigned)
 Ph: [phone] Fax: [phone]   Patient ID: Karen Aguilar, female    DOB: 12/24/1965, 58 y.o.   MRN: [id number]  This visit was conducted in person.  BP 126/82   Pulse 87   Temp 98.1 F (36.7 C) (Oral)   Ht 5' 7 (1.702 m)   Wt 199 lb 4 oz (90.4 kg)   LMP 10/04/2014 Comment: per labs  SpO2 98%   BMI 31.21 kg/m    CC: CPE Subjective:   HPI: Karen Aguilar is a 58 y.o. female presenting on 10/14/2023 for Annual Exam   Saw health advisor 08/2023 for medicare wellness visit. Note reviewed.   No results found.  Flowsheet Row Office Visit from 10/14/2023 in [facility] at [place]  PHQ-2 Total Score 4       10/14/2023   11:27 AM 09/06/2023   12:25 PM 08/12/2023    1:57 PM 05/31/2023   11:36 AM 05/22/2023    1:02 PM  Fall Risk   Falls in the past year? 0 0 0 0 0  Number falls in past yr: 0 0 0    Injury with Fall? 0 0 0    Risk for fall due to : No Fall Risks No Fall Risks No Fall Risks    Follow up Falls evaluation completed Falls evaluation completed Falls evaluation completed       Known neurotrophic keratitis of L eye in setting of L trigeminal neuralgia with facial residual numbness. Treating keratitis with PRGF QID. Sees [facility].    Also sees [facility] rheumatology (Dr Jinny Citron) for elevated ANA and elevated anti-centromere Ab - clinical mix between Sjogren's and systemic sclerosis, r/o seronegative Sjogren's. Anti-DNA, C3/C4, CPK, and anti SSA/SSB returned normal. Seeing Q6 months.   Chronic migraine/headaches exacerbated by CPAP use. Previous preventative treatment exacerbated GI upset, now on propranolol .  H/o GERD and bile reflux previously improved with carafate  and nexium . Sees [facility] Dr Willma PRN. Now back on carafate  due to acute exacerbation. This is now better after carafate  course and diet changes.   Saw [facility] neurosurgery Dr Bluford 09/2022 for opinion on surgical management for chronic cervical thoracic and lumbar spine  pain despite ACDF and spinal cord stimulator to thoracic spine. Recommend against further surgeries at this time. To consider cervical spinal cord stimulator. Thought component of chronic regional pain syndrome.   Hypothyroidism followed by endo Dr Damian due to Hashimoto's thyroiditis on armour thyroid  90mg  daily, once weekly only taking 1/2 tab.   Cardiac CT with CCS zero (03/2023)  Preventative: COLONOSCOPY 10/2016 rpt 10 yrs [place])  COLONOSCOPY 03/2019 - 1 polyp, biopsies taken ([place]) - sees Dr Willma GI.  Well woman with [facility]. Normal pap 08/2018, Q5 yrs.  LMP sometime 2016 - lab confirmed menopause  Mammogram 11/2022 [facility]1 @ [place]. Fibrous breasts - strong fmhx breast cancer (maternal aunt, maternal great aunt). DEXA 07/2022 - L forearm -2.0, RTF -1.4, not at increased risk of fracture  Lung cancer screening - not eligible  Flu shot - declines - has reacted previously - thimerosal preservative COVID vaccine - discussed, declined Tetanus - declines due to coverage  Prevnar-20 - due  Shingrix - declines  Advanced planning - received, reviewed, sent for scanning 10/2023. HCPOA is Odis Lander followed by Berneda and Lauraine daughters. Does not want life prolonging measures if terminally ill.  Seat belt use discussed  Sunscreen use discussed. No changing moles on skin.  Non smoker  Alcohol  -  rarely  Dentist - q6 mo  Ophthalmologist - sees regularly for neurotrophic keratitis  Bowel - intermittent constipation > diarrhea  Bladder - both urge and stress incontinence - with accidents, regularly wears pad.   Lives with husband and 2 daughters (one in college) Occupation: was Chemical engineer, on disability since 2011 - arachnoiditis Edu: associate's degree Activity: severe limitations 2/2 arachnoiditis and chronic pain, walks to end of driveway and back  Diet: good water, fruits/vegetables daily      Relevant past medical, surgical, family and social history reviewed  and updated as indicated. Interim medical history since our last visit reviewed. Allergies and medications reviewed and updated. Outpatient Medications Prior to Visit  Medication Sig Dispense Refill   Ascorbic Acid  (VITAMIN C  PO) Take 1 tablet by mouth as needed (for immune system support).      Bacillus Coagulans-Inulin (PROBIOTIC-PREBIOTIC PO) Take 1 capsule by mouth daily.     baclofen  (LIORESAL ) 10 MG tablet Take 0.5 tablets (5 mg total) by mouth 3 (three) times daily as needed.     calcium  carbonate (TUMS EX) 750 MG chewable tablet Chew 2-3 tablets by mouth 2 (two) times daily as needed.     camphor-menthol  (SARNA) lotion Apply 1 application topically at bedtime as needed for itching.      Carboxymethylcellulose Sodium (EYE DROPS OP) Apply to eye. PRGF eye drops. 1 drop 4-6 times a day in left eye     diclofenac  sodium (VOLTAREN ) 1 % GEL APPLY THIN LAYER EXTERNALLY TO THE AFFECTED AREA UP TO FOUR TIMES DAILY AS NEEDED 300 g 0   docusate sodium  (COLACE) 100 MG capsule Take 100 mg by mouth 2 (two) times daily. As needed     LORazepam  (ATIVAN ) 1 MG tablet TAKE 1/2 TABLET BY MOUTH TWICE DAILY AS NEEDED 30 tablet 0   NUCYNTA  ER 250 MG TB12 SMARTSIG:1 Tablet(s) By Mouth Every 12 Hours     ondansetron  (ZOFRAN -ODT) 4 MG disintegrating tablet Take 1 tablet (4 mg total) by mouth every 8 (eight) hours as needed for nausea or vomiting. 20 tablet 0   sucralfate  (CARAFATE ) 1 g tablet Take 1 tablet (1 g total) by mouth 2 (two) times daily before a meal. As needed 120 tablet 0   tacrolimus  (PROTOPIC ) 0.03 % ointment Apply to eye. Custom compound     thyroid  (ARMOUR) 90 MG tablet Take 1 tablet (90 mg total) by mouth daily. One day a week take 1/2 tablet     TYRVAYA  0.03 MG/ACT SOLN Place 1 spray into both nostrils 2 (two) times daily.     propranolol  ER (INDERAL  LA) 60 MG 24 hr capsule Take 1 capsule (60 mg total) by mouth daily. For migraine prevention 30 capsule 3   No facility-administered medications  prior to visit.     Per HPI unless specifically indicated in ROS section below Review of Systems  Constitutional:  Negative for activity change, appetite change, chills, fatigue, fever and unexpected weight change.  HENT:  Negative for hearing loss.   Eyes:  Positive for visual disturbance.  Respiratory:  Positive for chest tightness and shortness of breath (yesterday). Negative for cough and wheezing.   Cardiovascular:  Negative for chest pain, palpitations and leg swelling.  Gastrointestinal:  Positive for abdominal pain (reflux/indigestion), constipation, diarrhea and nausea. Negative for abdominal distention, blood in stool and vomiting.  Genitourinary:  Negative for difficulty urinating and hematuria.  Musculoskeletal:  Negative for arthralgias, myalgias and neck pain.  Occ hand swelling   Skin:  Negative for rash.  Neurological:  Positive for dizziness (occ) and headaches. Negative for seizures and syncope.  Hematological:  Negative for adenopathy. Does not bruise/bleed easily.  Psychiatric/Behavioral:  Positive for dysphoric mood. The patient is nervous/anxious.     Objective:  BP 126/82   Pulse 87   Temp 98.1 F (36.7 C) (Oral)   Ht 5' 7 (1.702 m)   Wt 199 lb 4 oz (90.4 kg)   LMP 10/04/2014 Comment: per labs  SpO2 98%   BMI 31.21 kg/m   Wt Readings from Last 3 Encounters:  10/14/23 199 lb 4 oz (90.4 kg)  09/06/23 200 lb (90.7 kg)  09/04/23 200 lb 3.2 oz (90.8 kg)      Physical Exam Vitals and nursing note reviewed.  Constitutional:      Appearance: Normal appearance. She is not ill-appearing.  HENT:     Head: Normocephalic and atraumatic.     Right Ear: Tympanic membrane, ear canal and external ear normal. There is no impacted cerumen.     Left Ear: Tympanic membrane, ear canal and external ear normal. There is no impacted cerumen.     Mouth/Throat:     Mouth: Mucous membranes are moist.     Pharynx: Oropharynx is clear. No oropharyngeal exudate or  posterior oropharyngeal erythema.  Eyes:     General:        Right eye: No discharge.        Left eye: No discharge.     Extraocular Movements: Extraocular movements intact.     Conjunctiva/sclera: Conjunctivae normal.     Pupils: Pupils are equal, round, and reactive to light.  Neck:     Thyroid : No thyroid  mass or thyromegaly.  Cardiovascular:     Rate and Rhythm: Normal rate and regular rhythm.     Pulses: Normal pulses.     Heart sounds: Normal heart sounds. No murmur heard. Pulmonary:     Effort: Pulmonary effort is normal. No respiratory distress.     Breath sounds: Normal breath sounds. No wheezing, rhonchi or rales.  Abdominal:     General: Bowel sounds are normal. There is no distension.     Palpations: Abdomen is soft. There is no mass.     Tenderness: There is no abdominal tenderness. There is no guarding or rebound.     Hernia: No hernia is present.  Musculoskeletal:     Cervical back: Normal range of motion and neck supple. No rigidity.     Right lower leg: No edema.     Left lower leg: No edema.  Lymphadenopathy:     Cervical: No cervical adenopathy.  Skin:    General: Skin is warm and dry.     Findings: No rash.  Neurological:     General: No focal deficit present.     Mental Status: She is alert. Mental status is at baseline.  Psychiatric:        Mood and Affect: Mood normal.        Behavior: Behavior normal.       Results for orders placed or performed in visit on 10/07/23  VITAMIN D  25 Hydroxy (Vit-D Deficiency, Fractures)   Collection Time: 10/07/23  8:28 AM  Result Value Ref Range   VITD 31.36 30.00 - 100.00 ng/mL  CBC with Differential/Platelet   Collection Time: 10/07/23  8:28 AM  Result Value Ref Range   WBC 5.2 4.0 - 10.5 K/uL   RBC 4.86 3.87 -  5.11 Mil/uL   Hemoglobin 14.0 12.0 - 15.0 g/dL   HCT 58.0 63.9 - 53.9 %   MCV 86.2 78.0 - 100.0 fl   MCHC 33.5 30.0 - 36.0 g/dL   RDW 87.2 88.4 - 84.4 %   Platelets 268.0 150.0 - 400.0 K/uL    Neutrophils Relative % 57.8 43.0 - 77.0 %   Lymphocytes Relative 34.1 12.0 - 46.0 %   Monocytes Relative 8.0 3.0 - 12.0 %   Eosinophils Relative 0.0 0.0 - 5.0 %   Basophils Relative 0.1 0.0 - 3.0 %   Neutro Abs 3.1 1.4 - 7.7 K/uL   Lymphs Abs 1.8 0.7 - 4.0 K/uL   Monocytes Absolute 0.4 0.1 - 1.0 K/uL   Eosinophils Absolute 0.0 0.0 - 0.7 K/uL   Basophils Absolute 0.0 0.0 - 0.1 K/uL  Ferritin   Collection Time: 10/07/23  8:28 AM  Result Value Ref Range   Ferritin 35.2 10.0 - 291.0 ng/mL  Comprehensive metabolic panel with GFR   Collection Time: 10/07/23  8:28 AM  Result Value Ref Range   Sodium 142 135 - 145 mEq/L   Potassium 4.2 3.5 - 5.1 mEq/L   Chloride 101 96 - 112 mEq/L   CO2 31 19 - 32 mEq/L   Glucose, Bld 107 (H) 70 - 99 mg/dL   BUN 9 6 - 23 mg/dL   Creatinine, Ser 9.14 0.40 - 1.20 mg/dL   Total Bilirubin 0.5 0.2 - 1.2 mg/dL   Alkaline Phosphatase 78 39 - 117 U/L   AST 16 0 - 37 U/L   ALT 16 0 - 35 U/L   Total Protein 6.6 6.0 - 8.3 g/dL   Albumin 4.2 3.5 - 5.2 g/dL   GFR 23.95 >39.99 mL/min   Calcium  9.7 8.4 - 10.5 mg/dL  Lipid panel   Collection Time: 10/07/23  8:28 AM  Result Value Ref Range   Cholesterol 238 (H) 0 - 200 mg/dL   Triglycerides 883.9 0.0 - 149.0 mg/dL   HDL 43.69 >60.99 mg/dL   VLDL 76.7 0.0 - 59.9 mg/dL   LDL Cholesterol 840 (H) 0 - 99 mg/dL   Total CHOL/HDL Ratio 4    NonHDL 182.14     Assessment & Plan:   Problem List Items Addressed This Visit     Advanced care planning/counseling discussion (Chronic)   Advanced planning - received, reviewed, sent for scanning 10/2023. HCPOA is Odis Lander followed by Berneda and Lauraine daughters. Does not want life prolonging measures if terminally ill.       Health maintenance examination - Primary (Chronic)   Preventative protocols reviewed and updated unless pt declined. Discussed healthy diet and lifestyle.  Will refer for well woman exam back to GYN per pt request.       Hypothyroidism due to  Hashimoto's thyroiditis   Followed by endocrinology Dr Damian on armour thyroid  90mg  daily.       Relevant Medications   propranolol  ER (INDERAL  LA) 60 MG 24 hr capsule   Fibromyalgia   GERD (gastroesophageal reflux disease)   Chronic, stable period on PRN sucralfate , tums  Not currently on pepcid  or PPI       Chronic pain syndrome   On Nucynta  250mg  BID through Dr Orlando at [facility]       OSA on CPAP   Appreciate pulm care, pending CPAP titration study results.       MDD (major depressive disorder), recurrent episode, moderate (HCC)   Only on lorazepam .  Hyperlipidemia   Reviewed elevated chol levels however recent cardiac CT with calcium  score of zero (03/2023). She is overall low risk - will stay off statin at this time.  Discussed focusing on low chol diet.  The 10-year ASCVD risk score (Arnett DK, et al., 2019) is: 3.7%   Values used to calculate the score:     Age: 47 years     Clincally relevant sex: Female     Is Non-Hispanic African American: No     Diabetic: No     Tobacco smoker: No     Systolic Blood Pressure: 126 mmHg     Is BP treated: Yes     HDL Cholesterol: 56.3 mg/dL     Total Cholesterol: 238 mg/dL       Relevant Medications   propranolol  ER (INDERAL  LA) 60 MG 24 hr capsule   Complicated migraine   Exacerbated by CPAP mask.  Continue propranolol  ER 60mg  daily (recent commencement) Intolerant/poor response to other treatments tried including gabapentin, imitrex, Atogepant       Relevant Medications   propranolol  ER (INDERAL  LA) 60 MG 24 hr capsule   Vitamin D  deficiency   Rec daily vit D supplementation.       GAD (generalized anxiety disorder)   Continue lorazepam .       Trigeminal neuralgia   Neurotrophic keratitis of left eye   Appreciate ophtho care, sees [facility] doctor regularly on PRGF drops. .       Osteopenia   Reviewed latest DEXA from 09/2022, consider repeat over next few years Encouraged good calcium  and  vitamin D  intake.       Nocturnal hypoxia   Multiple lipomas   Keratoconjunctivitis sicca not specified as Sjogren's   Appreciate rheum/ophtho care      Other Visit Diagnoses       Well woman exam       Relevant Orders   Ambulatory referral to Gynecology        Meds ordered this encounter  Medications   propranolol  ER (INDERAL  LA) 60 MG 24 hr capsule    Sig: Take 1 capsule (60 mg total) by mouth daily. For migraine prevention    Dispense:  90 capsule    Refill:  3    Orders Placed This Encounter  Procedures   Ambulatory referral to Gynecology    Referral Priority:   Routine    Referral Type:   Consultation    Referral Reason:   Specialty Services Required    Requested Specialty:   Gynecology    Number of Visits Requested:   1    Patient Instructions  Schedule well woman exam with [facility].  Consider Prevnar-20 - see handout.  Work on low cholesterol diet - we will monitor cholesterol levels.  Good to see you today Return as needed or in 4-6 months for follow up visit.   Follow up plan: Return in about 4 months (around 02/14/2024) for follow up visit.  Anton Blas, MD

## 2023-10-14 NOTE — Assessment & Plan Note (Signed)
 Preventative protocols reviewed and updated unless pt declined. Discussed healthy diet and lifestyle.  Will refer for well woman exam back to GYN per pt request.

## 2023-10-14 NOTE — Assessment & Plan Note (Addendum)
 Reviewed elevated chol levels however recent cardiac CT with calcium  score of zero (03/2023). She is overall low risk - will stay off statin at this time.  Discussed focusing on low chol diet.  The 10-year ASCVD risk score (Arnett DK, et al., 2019) is: 3.7%   Values used to calculate the score:     Age: 58 years     Clincally relevant sex: Female     Is Non-Hispanic African American: No     Diabetic: No     Tobacco smoker: No     Systolic Blood Pressure: 126 mmHg     Is BP treated: Yes     HDL Cholesterol: 56.3 mg/dL     Total Cholesterol: 238 mg/dL

## 2023-10-14 NOTE — Assessment & Plan Note (Signed)
 Advanced planning - received, reviewed, sent for scanning 10/2023. HCPOA is Karen Aguilar followed by Berneda and Lauraine daughters. Does not want life prolonging measures if terminally ill.

## 2023-10-14 NOTE — Patient Instructions (Addendum)
 Schedule well woman exam with [facility].  Consider Prevnar-20 - see handout.  Work on low cholesterol diet - we will monitor cholesterol levels.  Good to see you today Return as needed or in 4-6 months for follow up visit.

## 2023-10-16 ENCOUNTER — Encounter: Payer: Self-pay | Admitting: Family Medicine

## 2023-10-16 NOTE — Assessment & Plan Note (Addendum)
 Reviewed latest DEXA from 09/2022, consider repeat over next few years Encouraged good calcium  and vitamin D  intake.

## 2023-10-16 NOTE — Assessment & Plan Note (Signed)
 Chronic, stable period on PRN sucralfate , tums  Not currently on pepcid  or PPI

## 2023-10-16 NOTE — Assessment & Plan Note (Addendum)
 Exacerbated by CPAP mask.  Continue propranolol  ER 60mg  daily (recent commencement) Intolerant/poor response to other treatments tried including gabapentin, imitrex, Atogepant

## 2023-10-16 NOTE — Assessment & Plan Note (Signed)
 Appreciate ophtho care, sees [facility] doctor regularly on PRGF drops. Karen Aguilar

## 2023-10-16 NOTE — Assessment & Plan Note (Signed)
 Followed by endocrinology Dr Damian on armour thyroid  90mg  daily.

## 2023-10-16 NOTE — Assessment & Plan Note (Signed)
 On Nucynta  250mg  BID through Dr Orlando at [facility] clinic

## 2023-10-16 NOTE — Assessment & Plan Note (Signed)
 Only on lorazepam .

## 2023-10-16 NOTE — Assessment & Plan Note (Signed)
Continue lorazepam 

## 2023-10-16 NOTE — Assessment & Plan Note (Signed)
 Rec daily vit D supplementation.

## 2023-10-16 NOTE — Assessment & Plan Note (Signed)
 Appreciate pulm care, pending CPAP titration study results.

## 2023-10-16 NOTE — Assessment & Plan Note (Deleted)
Continue lorazepam 

## 2023-10-16 NOTE — Assessment & Plan Note (Signed)
 Appreciate rheum/ophtho care

## 2023-10-17 ENCOUNTER — Ambulatory Visit (INDEPENDENT_AMBULATORY_CARE_PROVIDER_SITE_OTHER): Payer: Self-pay | Admitting: Sleep Medicine

## 2023-10-17 DIAGNOSIS — G4733 Obstructive sleep apnea (adult) (pediatric): Secondary | ICD-10-CM

## 2023-10-18 NOTE — Addendum Note (Signed)
 Addended by: Cheynne Virden J on: 10/18/2023 10:58 AM   Modules accepted: Orders

## 2023-10-23 ENCOUNTER — Encounter: Payer: Self-pay | Admitting: Family Medicine

## 2023-10-23 DIAGNOSIS — Z1231 Encounter for screening mammogram for malignant neoplasm of breast: Secondary | ICD-10-CM

## 2023-11-06 ENCOUNTER — Other Ambulatory Visit: Payer: Self-pay | Admitting: Family Medicine

## 2023-11-06 DIAGNOSIS — F411 Generalized anxiety disorder: Secondary | ICD-10-CM

## 2023-11-06 NOTE — Telephone Encounter (Signed)
 Requesting: lorazepam  1mg   Contract: UDS:  Last Visit: 10/14/23 Next Visit: 01/14/24 Last Refill: 10/09/23 #30 and 0RF  Please Advise

## 2023-11-08 NOTE — Telephone Encounter (Signed)
 ERx

## 2023-11-13 ENCOUNTER — Encounter: Payer: Self-pay | Admitting: Family Medicine

## 2023-11-13 ENCOUNTER — Ambulatory Visit: Admitting: Family Medicine

## 2023-11-13 VITALS — BP 110/78 | HR 96 | Temp 98.1°F | Ht 67.0 in | Wt 201.4 lb

## 2023-11-13 DIAGNOSIS — G894 Chronic pain syndrome: Secondary | ICD-10-CM

## 2023-11-13 DIAGNOSIS — H168 Other keratitis: Secondary | ICD-10-CM

## 2023-11-13 DIAGNOSIS — R519 Headache, unspecified: Secondary | ICD-10-CM | POA: Insufficient documentation

## 2023-11-13 DIAGNOSIS — G4733 Obstructive sleep apnea (adult) (pediatric): Secondary | ICD-10-CM | POA: Diagnosis not present

## 2023-11-13 DIAGNOSIS — H16229 Keratoconjunctivitis sicca, not specified as Sjogren's, unspecified eye: Secondary | ICD-10-CM

## 2023-11-13 DIAGNOSIS — G43109 Migraine with aura, not intractable, without status migrainosus: Secondary | ICD-10-CM

## 2023-11-13 MED ORDER — KETOROLAC TROMETHAMINE 30 MG/ML IJ SOLN
30.0000 mg | Freq: Once | INTRAMUSCULAR | Status: AC
  Administered 2023-11-13: 30 mg via INTRAMUSCULAR

## 2023-11-13 NOTE — Patient Instructions (Signed)
 Toradol  shot today 30mg  I will refer you to [facility] clinic physiatrist for further evaluation /treatment of left occipital headaches.

## 2023-11-13 NOTE — Progress Notes (Signed)
 Ph: [phone] Fax: [phone]   Patient ID: Karen Aguilar, female    DOB: 1965/06/11, 58 y.o.   MRN: [id number]  This visit was conducted in person.  BP 110/78   Pulse 96   Temp 98.1 F (36.7 C) (Oral)   Ht 5' 7 (1.702 m)   Wt 201 lb 6 oz (91.3 kg)   LMP 10/04/2014 Comment: per labs  SpO2 98%   BMI 31.54 kg/m    CC: occipital pain  Subjective:   HPI: Karen Aguilar is a 58 y.o. female presenting on 11/13/2023 for Headache (Pt here for reoccurring occipital nerve pain x 5 mths. Pain scale today is 5.)   5 months of L>R occipital headache with radiation of pain throughout head. This is exacerbated by CPAP headgear - even using nasal pillow is bothersome. Currently pain 5/10, at its worse 10/10 pain. Pain described as tightness radiating down left shoulder, at times burning itch pain, at times sharp pain.  She's using muscle rub with limited benefit.  Continues baclofen  5mg  TID with benefit - higher baclofen  dose may have exacerbated migraines.  08/2023 - we treated this with toradol  shot 30mg  and prednisone  40mg  taper (08/2023) with only temporary relief.  Using heating pad regularly.  + nausea and photo/phonophobia with this.  Notes neck stretching exercises exacerbate pain.   OSA sees Dr Jess - did well with BiPAP titration study 10/2023 - planning to try BiPAP treatment at a pressure setting of 9/4 cm H2O.   Chronic migraine/headaches exacerbated by CPAP use.  Previous preventative treatment exacerbated GI upset, now on propranolol  SR 60mg  daily.   Saw [facility] neurosurgery Dr Bluford 09/2022 for opinion on surgical management for chronic cervical thoracic and lumbar spine pain despite ACDF (2021) and spinal cord stimulator to thoracic spine. Recommend against further surgeries at this time. To consider cervical spinal cord stimulator. Thought component of chronic regional pain syndrome.      Relevant past medical, surgical, family and social history reviewed and updated as  indicated. Interim medical history since our last visit reviewed. Allergies and medications reviewed and updated. Outpatient Medications Prior to Visit  Medication Sig Dispense Refill   Ascorbic Acid  (VITAMIN C  PO) Take 1 tablet by mouth as needed (for immune system support).      Bacillus Coagulans-Inulin (PROBIOTIC-PREBIOTIC PO) Take 1 capsule by mouth daily.     baclofen  (LIORESAL ) 10 MG tablet Take 0.5 tablets (5 mg total) by mouth 3 (three) times daily as needed.     calcium  carbonate (TUMS EX) 750 MG chewable tablet Chew 2-3 tablets by mouth 2 (two) times daily as needed.     camphor-menthol  (SARNA) lotion Apply 1 application topically at bedtime as needed for itching.      Carboxymethylcellulose Sodium (EYE DROPS OP) Apply to eye. PRGF eye drops. 1 drop 4-6 times a day in left eye     diclofenac  sodium (VOLTAREN ) 1 % GEL APPLY THIN LAYER EXTERNALLY TO THE AFFECTED AREA UP TO FOUR TIMES DAILY AS NEEDED 300 g 0   docusate sodium  (COLACE) 100 MG capsule Take 100 mg by mouth 2 (two) times daily. As needed     LORazepam  (ATIVAN ) 1 MG tablet TAKE 1/2 TABLET BY MOUTH TWICE DAILY AS NEEDED 30 tablet 0   NUCYNTA  ER 250 MG TB12 SMARTSIG:1 Tablet(s) By Mouth Every 12 Hours     ondansetron  (ZOFRAN -ODT) 4 MG disintegrating tablet Take 1 tablet (4 mg total) by mouth every 8 (eight) hours as needed  for nausea or vomiting. 20 tablet 0   propranolol  ER (INDERAL  LA) 60 MG 24 hr capsule Take 1 capsule (60 mg total) by mouth daily. For migraine prevention 90 capsule 3   sucralfate  (CARAFATE ) 1 g tablet Take 1 tablet (1 g total) by mouth 2 (two) times daily before a meal. As needed 120 tablet 0   tacrolimus  (PROTOPIC ) 0.03 % ointment Apply to eye. Custom compound     thyroid  (ARMOUR) 90 MG tablet Take 1 tablet (90 mg total) by mouth daily. One day a week take 1/2 tablet     TYRVAYA  0.03 MG/ACT SOLN Place 1 spray into both nostrils 2 (two) times daily.     No facility-administered medications prior to visit.     Past Medical History:  Diagnosis Date   Abnormal MRI scan, bone 2012   abnormal marrow signal humeral head, glenoid and scapula - diffuse replacement of fatty marrow - likely benign, no need for further investigation ([place])   Acute gastroenteropathy due to Norovirus 02/19/2021   Agnosia for temperature    Right Leg   Anemia    etiology unknown-iron infusion in past,last 9/12   Anxiety    Anxiety and depression    Arachnoiditis    S1 nerve root, 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 Orlando [facility]   COVID-19 virus infection 09/2018   DDD (degenerative disc disease), cervical    DDD (degenerative disc disease), lumbar    s/p permanent nerve damage after back surgery   Depression    Depression with anxiety    Diverticulosis    Eczema    Family history of adverse reaction to anesthesia    Mother - nausea   Fibromyalgia    Foot drop    right - numbness, tingling - twisting   Gallstones    GERD (gastroesophageal reflux disease)    rare zantac  PRN N/V at times with this   Headache(784.0)    migraines   Hemorrhoid    Hemorrhoids    History of kidney stones    Hyperlipidemia    Hypothyroidism 1990s   hashimoto's thyroiditis   IBS (irritable bowel syndrome)    IBS (irritable bowel syndrome)    Incontinence of urine    s/p sling procedure-unresolved   Iron deficiency    h/o anemia, s/p iron infusion (2012)   Left upper lobe pneumonia 06/02/2023   Liver hemangioma     x2   Memory change    due to medications   Migraines    and h/o optical migraine   Migraines    Mixed urge and stress incontinence 2008   extensive workup including urodynamics, failed miltiple anticholinergics including myrbetriq and urogesic blue s/p PTNS, normal cystoscopy 2014, seen by Dr Gaston and Dr Penne   Neurogenic bladder 2008   after 13 spine surgeries with periph neuropathy and chronic LBP   Neuropathy of lower extremity 2011   Nocturia    Notalgia  paresthetica    Osteoarthritis 12/2013   PONV (postoperative nausea and vomiting)    Pulmonary nodule 04/2012   7.18mm RLL nodule - resolved on imaging 12/2012   Sepsis due to pneumonia (HCC) 05/19/2023   Sleep apnea    mild-doesn't use cpap   Urine frequency    Vitamin D  deficiency     Past Surgical History:  Procedure Laterality Date   ANTERIOR CERVICAL DECOMP/DISCECTOMY FUSION     C3, 4, 5 - improved after surgery  ANTERIOR CERVICAL DECOMP/DISCECTOMY FUSION  07/2017   C6/7 Dr Samule   ARTERY BIOPSY Left 09/02/2019   Procedure: BIOPSY TEMPORAL ARTERY;  Surgeon: Rodolph Romano, MD;  Location: [facility] ORS;  Service: General;  Laterality: Left;   BACK SURGERY     multiple-L4-s1,hardware,fusion,removal,infection s/p 5/11 procedure   BACK SURGERY  02/16/2011   my 7th back OR; Procedure: Exploration of fusion removal of hardware L4-S1, harvesting of right iliac crest bone graft, redo posterior lateral fusion L4-5 using iliac crest bone graft BMP and master graft replacement of bilateral L4 screws with a Telfa tech 7.5 x 40 mm screw on the right and 8.5 x 40 mm screw on the left placement of a large Hemovac drain   CESAREAN SECTION  1996; 2001   x 2   CHOLECYSTECTOMY  2001   COLONOSCOPY  10/2016   rpt 10 yrs Jonne)   COLONOSCOPY  03/2019   1 polyp, biopsies taken ([place])   ESOPHAGEAL MANOMETRY  06/2017   WNL (Dr Willma)   ESOPHAGOGASTRODUODENOSCOPY  03/2013   early esophageal stricture dilated, o/w WNL [place])   ESOPHAGOGASTRODUODENOSCOPY  12/2014   WNL, s/p empiric dilation of esophagus O[place]ta)   ESOPHAGOGASTRODUODENOSCOPY  10/2015   WNL, biopsies WNL as well (minimal esophagitis with reactive changes) Verlee)   ESOPHAGOGASTRODUODENOSCOPY  10/2016   biopsy suggestive of ulcer ([place])   ESOPHAGOGASTRODUODENOSCOPY  03/2019   mild portal hypertensive gastropathy, gastric erosion without bleeding, duodenal diverticulum ([place])   ESOPHAGOGASTRODUODENOSCOPY  09/2020    normal esophagus with mild edema, no gastritis, H pylori neg (Dr Tanda at [facility])   ESOPHAGOGASTRODUODENOSCOPY  08/31/2020   biopsy with reactive gastropathy ([facility])   HEMORRHOID BANDING     LASIK Bilateral    LUMBAR WOUND DEBRIDEMENT  03/15/2011   Procedure: LUMBAR WOUND DEBRIDEMENT;  Surgeon: Arley SHAUNNA Helling, MD;  Lumbar Wound Debridement   MANDIBLE RECONSTRUCTION  1986   PFT  01/28/2013   WNL   PUBOVAGINAL SLING  2008   midurethral sling   SHOULDER CLOSED REDUCTION Left 12/18/2010   Procedure: CLOSED MANIPULATION SHOULDER;  Surgeon: Reyes JAYSON Billing; labrial debridement   SPINAL CORD STIMULATOR INSERTION N/A 08/19/2015   Procedure: LUMBAR SPINAL CORD STIMULATOR INSERTION;  Surgeon: Fairy Levels, MD;  Location: [facility];  Service: Neurosurgery;  Laterality: N/A;  LUMBAR SPINAL CORD STIMULATOR INSERTION   SPINE SURGERY     TONSILLECTOMY  1972   as a child; then they grew back   Per HPI unless specifically indicated in ROS section below Review of Systems  Objective:  BP 110/78   Pulse 96   Temp 98.1 F (36.7 C) (Oral)   Ht 5' 7 (1.702 m)   Wt 201 lb 6 oz (91.3 kg)   LMP 10/04/2014 Comment: per labs  SpO2 98%   BMI 31.54 kg/m   Wt Readings from Last 3 Encounters:  11/13/23 201 lb 6 oz (91.3 kg)  10/14/23 199 lb 4 oz (90.4 kg)  09/06/23 200 lb (90.7 kg)      Physical Exam Vitals and nursing note reviewed.  Constitutional:      Appearance: Normal appearance. She is not ill-appearing.  Eyes:     Comments: PERRLA, EOMI without pain   Neck:     Comments:  Neg spurling bilaterally  Tight paracervical mm on left FROM flexion/extension and lateral rotation of neck, limited lateral flexion bilaterally that reproduces pain  Tender to palpation along left occipital nerve Musculoskeletal:     Cervical back: Neck supple.  Skin:  General: Skin is warm and dry.     Findings: No rash.  Neurological:     General: No focal deficit present.     Mental Status: She is alert.   Psychiatric:        Mood and Affect: Mood normal.        Behavior: Behavior normal.       Lab Results  Component Value Date   NA 142 10/07/2023   CL 101 10/07/2023   K 4.2 10/07/2023   CO2 31 10/07/2023   BUN 9 10/07/2023   CREATININE 0.85 10/07/2023   GFR 76.04 10/07/2023   CALCIUM  9.7 10/07/2023   ALBUMIN 4.2 10/07/2023   GLUCOSE 107 (H) 10/07/2023    Lab Results  Component Value Date   WBC 5.2 10/07/2023   HGB 14.0 10/07/2023   HCT 41.9 10/07/2023   MCV 86.2 10/07/2023   PLT 268.0 10/07/2023    Assessment & Plan:   Problem List Items Addressed This Visit     Chronic pain syndrome   Chronic pain managed on Nucynta  ER 250mg  BID - sees Dr Orlando at [facility].       OSA treated with BiPAP   Seeing pulmonology  Just started using BiPAP machine and she notes she tolerates this better.  Also now only using nasal pillow mask which anticipate is helping decrease occipital pressure.       Complicated migraine   H/o this, however current headache syndrome is different.       Neurotrophic keratitis of left eye   Per latest visits, there is some improvement noted with increased sceral lens use and continued PRGF drops.       Keratoconjunctivitis sicca not specified as Sjogren's   Appreciate rheum, ophtho, immunology care.       Occipital headache - Primary   Ongoing for 5 months, initially exacerbated by CPAP full face mask use earlier in the year.  She is now using nasal pillow mask which should be better tolerated, however notes ongoing predominantly L occipital head pain with radiation down neck and up scalp into left frontal head.  Has tried and failed multiple treatments to date, has multiple drug intolerances.  Did not respond to prednisone  course, baclofen  5mg  TID is beneficial but dosing limited by side effects (worsened migraines). Neck exercises previously exacerbated condition.  She is already on chronic Nucynta  ER 250mg  BID through pain  clinic.  Rpt Toradol  IM 30mg  today. Refer to PM&R for further evaluation.       Relevant Orders   Ambulatory referral to Physical Medicine Rehab     Meds ordered this encounter  Medications   ketorolac  (TORADOL ) 30 MG/ML injection 30 mg    Orders Placed This Encounter  Procedures   Ambulatory referral to Physical Medicine Rehab    Referral Priority:   Routine    Referral Type:   Rehabilitation    Referral Reason:   Specialty Services Required    Requested Specialty:   Physical Medicine and Rehabilitation    Number of Visits Requested:   1    Patient Instructions  Toradol  shot today 30mg  I will refer you to [facility] clinic physiatrist for further evaluation /treatment of left occipital headaches.   Follow up plan: No follow-ups on file.  Anton Blas, MD

## 2023-11-13 NOTE — Assessment & Plan Note (Signed)
 Per latest visits, there is some improvement noted with increased sceral lens use and continued PRGF drops.

## 2023-11-13 NOTE — Assessment & Plan Note (Addendum)
 Appreciate rheum, ophtho, immunology care.

## 2023-11-13 NOTE — Assessment & Plan Note (Signed)
 Seeing pulmonology  Just started using BiPAP machine and she notes she tolerates this better.  Also now only using nasal pillow mask which anticipate is helping decrease occipital pressure.

## 2023-11-13 NOTE — Assessment & Plan Note (Addendum)
 Chronic pain managed on Nucynta  ER 250mg  BID - sees Dr Orlando at [facility].

## 2023-11-13 NOTE — Assessment & Plan Note (Signed)
 H/o this, however current headache syndrome is different.

## 2023-11-13 NOTE — Assessment & Plan Note (Signed)
 Ongoing for 5 months, initially exacerbated by CPAP full face mask use earlier in the year.  She is now using nasal pillow mask which should be better tolerated, however notes ongoing predominantly L occipital head pain with radiation down neck and up scalp into left frontal head.  Has tried and failed multiple treatments to date, has multiple drug intolerances.  Did not respond to prednisone  course, baclofen  5mg  TID is beneficial but dosing limited by side effects (worsened migraines). Neck exercises previously exacerbated condition.  She is already on chronic Nucynta  ER 250mg  BID through pain clinic.  Rpt Toradol  IM 30mg  today. Refer to PM&R for further evaluation.

## 2023-11-20 ENCOUNTER — Ambulatory Visit: Admitting: Family Medicine

## 2023-12-09 ENCOUNTER — Encounter: Payer: Self-pay | Admitting: Family Medicine

## 2023-12-09 ENCOUNTER — Other Ambulatory Visit: Payer: Self-pay | Admitting: Family Medicine

## 2023-12-09 DIAGNOSIS — G4733 Obstructive sleep apnea (adult) (pediatric): Secondary | ICD-10-CM

## 2023-12-09 DIAGNOSIS — H168 Other keratitis: Secondary | ICD-10-CM

## 2023-12-09 DIAGNOSIS — F411 Generalized anxiety disorder: Secondary | ICD-10-CM

## 2023-12-09 DIAGNOSIS — G500 Trigeminal neuralgia: Secondary | ICD-10-CM

## 2023-12-10 NOTE — Telephone Encounter (Signed)
 ERx

## 2023-12-11 ENCOUNTER — Telehealth: Payer: Self-pay | Admitting: Sleep Medicine

## 2023-12-11 NOTE — Telephone Encounter (Signed)
 CPAP compliance appointment needed prior to 02/02/24

## 2023-12-12 NOTE — Progress Notes (Unsigned)
 ANNUAL PREVENTATIVE CARE GYNECOLOGY  ENCOUNTER NOTE  SUBJECTIVE:       Karen Aguilar is a 58 y.o. G33P2002 female here for a routine annual gynecologic exam. The patient is not currently sexually active. The patient is not currently taking hormone replacement therapy. Patient denies post-menopausal vaginal bleeding. Family history of breast, uterine, ovarian cancer: yes. The patient wears seatbelts: yes. The patient participates in regular exercise: no. Has the patient ever been transfused or tattooed?: no. The patient reports that there is not domestic violence in her life. Has the patient completed the Gardasil vaccine? no.  Current complaints: 1.  Over the last couple of years she's noticed some lumps under both her breasts.  Sometimes they are so painful she can't wear a bra. She had an ultrasound done of these lumps and was told they were lipomas.  She's been experiencing sharp pains in both breasts, usually in her left.  The pain is sometimes on the outside of the breast under her arms, sometimes it is in the middle through her nipple. Last mammogram was normal.   Gynecologic History Patient's last menstrual period was 10/04/2014. Contraception: post menopausal status Last Pap: 08/18/2018. Results were: normal History of abnormal pap: Yes, 30 years ago had cryotherapy on cervix History of STIs: No Last Mammogram: 11/20/2022. Results were: normal Last Colonoscopy: 03/10/19 Last Dexa Scan:   NA  PHQ-2:     12/17/2023    3:38 PM 11/13/2023   11:08 AM  Depression screen PHQ 2/9  Decreased Interest  3  Down, Depressed, Hopeless  2  PHQ - 2 Score  5  Altered sleeping 3 3  Tired, decreased energy 3 3  Change in appetite 3 2  Feeling bad or failure about yourself  2 2  Trouble concentrating 2 3  Moving slowly or fidgety/restless 0 0  Suicidal thoughts 0 1  PHQ-9 Score  19  Difficult doing work/chores  Very difficult    Obstetric History OB History  Gravida Para Term Preterm  AB Living  2 2 2   2   SAB IAB Ectopic Multiple Live Births          # Outcome Date GA Lbr Len/2nd Weight Sex Type Anes PTL Lv  2 Term           1 Term             Obstetric Comments  1st Menstrual Cycle: 14  1st Pregnancy:  29    Past Medical History:  Diagnosis Date   Abnormal MRI scan, bone 2012   abnormal marrow signal humeral head, glenoid and scapula - diffuse replacement of fatty marrow - likely benign, no need for further investigation ([place])   Acute gastroenteropathy due to Norovirus 02/19/2021   Agnosia for temperature    Right Leg   Anemia    etiology unknown-iron infusion in past,last 9/12   Anxiety    Anxiety and depression    Arachnoiditis    S1 nerve root, 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 [facility]   COVID-19 virus infection 09/2018   DDD (degenerative disc disease), cervical    DDD (degenerative disc disease), lumbar    s/p permanent nerve damage after back surgery   Depression    Depression with anxiety    Diverticulosis    Eczema    Family history of adverse reaction to anesthesia    Mother - nausea   Fibromyalgia  Foot drop    right - numbness, tingling - twisting   Gallstones    GERD (gastroesophageal reflux disease)    rare zantac  PRN N/V at times with this   Headache(784.0)    migraines   Hemorrhoid    Hemorrhoids    History of kidney stones    Hyperlipidemia    Hypothyroidism 1990s   hashimoto's thyroiditis   IBS (irritable bowel syndrome)    IBS (irritable bowel syndrome)    Incontinence of urine    s/p sling procedure-unresolved   Iron deficiency    h/o anemia, s/p iron infusion (2012)   Left upper lobe pneumonia 06/02/2023   Liver hemangioma     x2   Memory change    due to medications   Migraines    and h/o optical migraine   Migraines    Mixed urge and stress incontinence 2008   extensive workup including urodynamics, failed miltiple anticholinergics including myrbetriq and  urogesic blue s/p PTNS, normal cystoscopy 2014, seen by Dr Gaston and Dr Penne   Neurogenic bladder 2008   after 13 spine surgeries with periph neuropathy and chronic LBP   Neuropathy of lower extremity 2011   Nocturia    Notalgia paresthetica    Osteoarthritis 12/2013   PONV (postoperative nausea and vomiting)    Pulmonary nodule 04/2012   7.51mm RLL nodule - resolved on imaging 12/2012   Sepsis due to pneumonia (HCC) 05/19/2023   Sleep apnea    mild-doesn't use cpap   Urine frequency    Vitamin D  deficiency     Family History  Problem Relation Age of Onset   Thyroid  disease Mother        ?empty sella   Irritable bowel syndrome Mother    Dysphagia Mother    Lung cancer Father 77        (smoker)   Cancer Father    Diabetes Brother    Alzheimer's disease Maternal Grandmother    CAD Maternal Grandfather 67       MI   Heart disease Maternal Grandfather    Heart attack Maternal Grandfather    Colon cancer Paternal Grandmother    Cancer Paternal Grandmother    Lung cancer Paternal Grandfather         (smoker)   Cancer Paternal Grandfather    Diabetes Daughter    Breast cancer Maternal Aunt    Thyroid  disease Cousin    Cancer Other        unsure (ovarian/uterine)   Cancer Maternal Aunt    Cancer Maternal Aunt     Past Surgical History:  Procedure Laterality Date   ANTERIOR CERVICAL DECOMP/DISCECTOMY FUSION     C3, 4, 5 - improved after surgery   ANTERIOR CERVICAL DECOMP/DISCECTOMY FUSION  07/2017   C6/7 Dr Samule   ARTERY BIOPSY Left 09/02/2019   Procedure: BIOPSY TEMPORAL ARTERY;  Surgeon: Rodolph Romano, MD;  Location: [facility];  Service: General;  Laterality: Left;   BACK SURGERY     multiple-L4-s1,hardware,fusion,removal,infection s/p 5/11 procedure   BACK SURGERY  02/16/2011   my 7th back OR; Procedure: Exploration of fusion removal of hardware L4-S1, harvesting of right iliac crest bone graft, redo posterior lateral fusion L4-5 using iliac crest  bone graft BMP and master graft replacement of bilateral L4 screws with a Telfa tech 7.5 x 40 mm screw on the right and 8.5 x 40 mm screw on the left placement of a large Hemovac drain   CESAREAN SECTION  1996; 2001  x 2   CHOLECYSTECTOMY  2001   COLONOSCOPY  10/2016   rpt 10 yrs [place]ne)   COLONOSCOPY  03/2019   1 polyp, biopsies taken ([place])   ESOPHAGEAL MANOMETRY  06/2017   WNL (Dr Willma)   ESOPHAGOGASTRODUODENOSCOPY  03/2013   early esophageal stricture dilated, o/w WNL [place])   ESOPHAGOGASTRODUODENOSCOPY  12/2014   WNL, s/p empiric dilation of esophagus Oletta)   ESOPHAGOGASTRODUODENOSCOPY  10/2015   WNL, biopsies WNL as well (minimal esophagitis with reactive changes) Verlee)   ESOPHAGOGASTRODUODENOSCOPY  10/2016   biopsy suggestive of ulcer ([place])   ESOPHAGOGASTRODUODENOSCOPY  03/2019   mild portal hypertensive gastropathy, gastric erosion without bleeding, duodenal diverticulum ([place])   ESOPHAGOGASTRODUODENOSCOPY  09/2020   normal esophagus with mild edema, no gastritis, H pylori neg (Dr Tanda at [facility])   ESOPHAGOGASTRODUODENOSCOPY  08/31/2020   biopsy with reactive gastropathy ([facility])   HEMORRHOID BANDING     LASIK Bilateral    LUMBAR WOUND DEBRIDEMENT  03/15/2011   Procedure: LUMBAR WOUND DEBRIDEMENT;  Surgeon: Arley SHAUNNA Helling, MD;  Lumbar Wound Debridement   MANDIBLE RECONSTRUCTION  1986   PFT  01/28/2013   WNL   PUBOVAGINAL SLING  2008   midurethral sling   SHOULDER CLOSED REDUCTION Left 12/18/2010   Procedure: CLOSED MANIPULATION SHOULDER;  Surgeon: Reyes JAYSON Billing; labrial debridement   SPINAL CORD STIMULATOR INSERTION N/A 08/19/2015   Procedure: LUMBAR SPINAL CORD STIMULATOR INSERTION;  Surgeon: Fairy Levels, MD;  Location: [facility];  Service: Neurosurgery;  Laterality: N/A;  LUMBAR SPINAL CORD STIMULATOR INSERTION   SPINE SURGERY     TONSILLECTOMY  1972   as a child; then they grew back    Social History   Socioeconomic History   Marital  status: Married    Spouse name: Not on file   Number of children: 2   Years of education: Not on file   Highest education level: Associate degree: occupational, scientist, product/process development, or vocational program  Occupational History   Occupation: disabled  Tobacco Use   Smoking status: Never    Passive exposure: Past (as a child)   Smokeless tobacco: Never  Vaping Use   Vaping status: Never Used  Substance and Sexual Activity   Alcohol  use: Not Currently    Alcohol /week: 1.0 standard drink of alcohol     Types: 1 Glasses of wine per week    Comment: rarely drinks wine   Drug use: Yes    Comment: prescribed meds for back   Sexual activity: Not Currently  Other Topics Concern   Not on file  Social History Narrative   Lives with husband and 2 daughters)   Daughter of Thersia Seals   Occupation: was Chemical Engineer   Disability since 2011 - arachnoiditis   Edu: associate's degree   Activity: severe limitations 2/2 arachnoiditis and chronic pain, walks to end of driveway and back   Diet: good water, fruits/vegetables daily   Social Drivers of Health   Tobacco Use: Low Risk (12/17/2023)   Patient History    Smoking Tobacco Use: Never    Smokeless Tobacco Use: Never    Passive Exposure: Past  Financial Resource Strain: Low Risk (11/12/2023)   Overall Financial Resource Strain (CARDIA)    Difficulty of Paying Living Expenses: Not hard at all  Food Insecurity: No Food Insecurity (11/12/2023)   Epic    Worried About Radiation Protection Practitioner of Food in the Last Year: Never true    Ran Out of Food in the Last Year:  Never true  Transportation Needs: No Transportation Needs (11/12/2023)   Epic    Lack of Transportation (Medical): No    Lack of Transportation (Non-Medical): No  Physical Activity: Inactive (11/12/2023)   Exercise Vital Sign    Days of Exercise per Week: 0 days    Minutes of Exercise per Session: Not on file  Stress: Stress Concern Present (11/12/2023)   [employer] of  Occupational Health - Occupational Stress Questionnaire    Feeling of Stress: Very much  Social Connections: Moderately Isolated (11/12/2023)   Social Connection and Isolation Panel    Frequency of Communication with Friends and Family: More than three times a week    Frequency of Social Gatherings with Friends and Family: Once a week    Attends Religious Services: Patient declined    Active Member of Clubs or Organizations: No    Attends Engineer, Structural: Not on file    Marital Status: Married  Intimate Partner Violence: Not At Risk (08/12/2023)   Epic    Fear of Current or Ex-Partner: No    Emotionally Abused: No    Physically Abused: No    Sexually Abused: No  Depression (PHQ2-9): High Risk (12/17/2023)   Depression (PHQ2-9)    PHQ-2 Score: 13  Alcohol  Screen: Low Risk (11/12/2023)   Alcohol  Screen    Last Alcohol  Screening Score (AUDIT): 2  Housing: Low Risk (11/12/2023)   Epic    Unable to Pay for Housing in the Last Year: No    Number of Times Moved in the Last Year: 0    Homeless in the Last Year: No  Utilities: Not At Risk (08/12/2023)   Epic    Threatened with loss of utilities: No  Health Literacy: Adequate Health Literacy (08/07/2022)   B1300 Health Literacy    Frequency of need for help with medical instructions: Never    Medications Ordered Prior to Encounter[1]  Allergies[2]   Review of Systems ROS Review of Systems - General ROS: negative for - chills, fatigue, fever, hot flashes, night sweats, weight gain or weight loss Psychological ROS: negative for - anxiety, decreased libido, depression, mood swings, physical abuse or sexual abuse Ophthalmic ROS: negative for - blurry vision, eye pain or loss of vision ENT ROS: negative for - headaches, hearing change, visual changes or vocal changes Allergy and Immunology ROS: negative for - hives, itchy/watery eyes or seasonal allergies Hematological and Lymphatic ROS: negative for - bleeding problems,  bruising, swollen lymph nodes or weight loss Endocrine ROS: negative for - galactorrhea, hair pattern changes, hot flashes, malaise/lethargy, mood swings, palpitations, polydipsia/polyuria, skin changes, temperature intolerance or unexpected weight changes Breast ROS: negative for - new or changing breast lumps or nipple discharge; inframammary lipomas Respiratory ROS: negative for - cough or shortness of breath Cardiovascular ROS: negative for - chest pain, irregular heartbeat, palpitations or shortness of breath Gastrointestinal ROS: no abdominal pain, change in bowel habits, or black or bloody stools Genito-Urinary ROS: no dysuria, trouble voiding, or hematuria Musculoskeletal ROS: negative for - joint pain or joint stiffness Neurological ROS: negative for - bowel and bladder control changes Dermatological ROS: negative for rash and skin lesion changes   OBJECTIVE:   BP 125/78   Pulse 81   Wt 200 lb (90.7 kg)   LMP 10/04/2014 Comment: per labs  BMI 31.32 kg/m   CONSTITUTIONAL: Well-developed, well-nourished female in no acute distress.  PSYCHIATRIC: Normal mood and affect. Normal behavior. Normal judgment and thought content. NEUROLGIC: Alert and oriented to  person, place, and time. Normal muscle tone coordination. No cranial nerve deficit noted. HENT:  Normocephalic, atraumatic, External right and left ear normal. Oropharynx is clear and moist EYES: Conjunctivae and EOM are normal. No scleral icterus.  NECK: Normal range of motion, supple, no masses.  Normal thyroid .  SKIN: Skin is warm and dry. No rash noted. Not diaphoretic. No erythema. No pallor. CARDIOVASCULAR: Normal heart rate noted, regular rhythm, no murmur. RESPIRATORY: Clear to auscultation bilaterally. Effort and breath sounds normal, no problems with respiration noted. BREASTS: Symmetric in size. No masses, skin changes, nipple drainage, or lymphadenopathy.inframammary ridge some firmness and tenderness  appreciated. ABDOMEN: Soft, normal bowel sounds, no distention noted.  No tenderness, rebound or guarding.  PELVIC:  Bladder no bladder distension noted  Urethra: normal appearing urethra with no masses, tenderness or lesions  Vulva: normal appearing vulva with no masses, tenderness or lesions  Vagina: normal appearing vagina with normal color and discharge, no lesions and atrophic  Cervix: normal appearing cervix without discharge or lesions  Uterus: uterus is normal size, shape, consistency and nontender  Adnexa: normal adnexa in size, nontender and no masses  RV: External Exam NormaI, No Rectal Masses, and Normal Sphincter tone  MUSCULOSKELETAL: Normal range of motion. No tenderness.  No cyanosis, clubbing, or edema.  2+ distal pulses. LYMPHATIC: No Axillary, Supraclavicular, or Inguinal Adenopathy.  Labs: Lab Results  Component Value Date   WBC 5.2 10/07/2023   HGB 14.0 10/07/2023   HCT 41.9 10/07/2023   MCV 86.2 10/07/2023   PLT 268.0 10/07/2023    Lab Results  Component Value Date   CREATININE 0.85 10/07/2023   BUN 9 10/07/2023   NA 142 10/07/2023   K 4.2 10/07/2023   CL 101 10/07/2023   CO2 31 10/07/2023    Lab Results  Component Value Date   ALT 16 10/07/2023   AST 16 10/07/2023   ALKPHOS 78 10/07/2023   BILITOT 0.5 10/07/2023    Lab Results  Component Value Date   CHOL 238 (H) 10/07/2023   HDL 56.30 10/07/2023   LDLCALC 159 (H) 10/07/2023   TRIG 116.0 10/07/2023   CHOLHDL 4 10/07/2023    Lab Results  Component Value Date   TSH 29.75 (H) 04/12/2023    Lab Results  Component Value Date   HGBA1C 5.6 02/27/2017     ASSESSMENT:   1. Well woman exam with routine gynecological exam   2. Cervical cancer screening   3. Encounter for screening mammogram for malignant neoplasm of breast   4. Vaginal atrophy   5. Vaginal dryness, menopausal   6. Lipoma of other specified sites      PLAN:   MAKEYA HILGERT is a 58 y.o. G28P2002 female here today for  her annual exam, doing well.  Pap: done with cotesting today Mammogram: ordered through PCP Colon: PCP  Labs: PCP PHQ-2 = 13, discussed coping techniques; RTC if worsens or develops concern Contraception: post-menopausal Healthy lifestyle modifications discussed: multivitamin, diet, exercise, sunscreen, tobacco and alcohol  use. Emphasized importance of regular physical activity.  Calcium  and Vit D recommendation reviewed.  All questions answered to patient's satisfaction.   Abdominal/Inframammary lipomas:  -Seen on US  06/05/23 ordered by PCP -Pt would like to discuss removal with surgery, warned that they are not totally appreciable and they may not warrant surgical removal, but she is uncomfortable and they hurt; referral sent for surgical consult.   Vaginal dryness/atrophy -Rx for Vagifem  -- SE and dosing reviewed  Follow up 1 yr  for annual, sooner prn.    Estil Mangle, DO [facility]    [1]  Current Outpatient Medications on File Prior to Visit  Medication Sig Dispense Refill   Ascorbic Acid  (VITAMIN C  PO) Take 1 tablet by mouth as needed (for immune system support).      Bacillus Coagulans-Inulin (PROBIOTIC-PREBIOTIC PO) Take 1 capsule by mouth daily.     baclofen  (LIORESAL ) 10 MG tablet Take 0.5 tablets (5 mg total) by mouth 3 (three) times daily as needed.     calcium  carbonate (TUMS EX) 750 MG chewable tablet Chew 2-3 tablets by mouth 2 (two) times daily as needed.     camphor-menthol  (SARNA) lotion Apply 1 application topically at bedtime as needed for itching.      Carboxymethylcellulose Sodium (EYE DROPS OP) Apply to eye. PRGF eye drops. 1 drop 4-6 times a day in left eye     diclofenac  sodium (VOLTAREN ) 1 % GEL APPLY THIN LAYER EXTERNALLY TO THE AFFECTED AREA UP TO FOUR TIMES DAILY AS NEEDED 300 g 0   docusate sodium  (COLACE) 100 MG capsule Take 100 mg by mouth 2 (two) times daily. As needed     LORazepam  (ATIVAN ) 1 MG tablet TAKE 1/2 TABLET BY MOUTH TWICE  DAILY AS NEEDED 30 tablet 1   NUCYNTA  ER 250 MG TB12 SMARTSIG:1 Tablet(s) By Mouth Every 12 Hours     ondansetron  (ZOFRAN -ODT) 4 MG disintegrating tablet Take 1 tablet (4 mg total) by mouth every 8 (eight) hours as needed for nausea or vomiting. 20 tablet 0   propranolol  ER (INDERAL  LA) 60 MG 24 hr capsule Take 1 capsule (60 mg total) by mouth daily. For migraine prevention 90 capsule 3   sucralfate  (CARAFATE ) 1 g tablet Take 1 tablet (1 g total) by mouth 2 (two) times daily before a meal. As needed 120 tablet 0   tacrolimus  (PROTOPIC ) 0.03 % ointment Apply to eye. Custom compound     thyroid  (ARMOUR) 90 MG tablet Take 1 tablet (90 mg total) by mouth daily. One day a week take 1/2 tablet     TYRVAYA  0.03 MG/ACT SOLN Place 1 spray into both nostrils 2 (two) times daily.     No current facility-administered medications on file prior to visit.  [2]  Allergies Allergen Reactions   Augmentin  [Amoxicillin -Pot Clavulanate] Swelling    Lip swelling and irritation.    Fluconazole  Swelling    Lips - swelling, bleeding, blisters, cracking, peeling   Erythromycin Nausea And Vomiting   Influenza Vaccines Hives and Swelling    Redness, heat, and extreme swelling at site   Levothyroxine  Hives   Thimerosal (Thiomersal) Swelling    Local reaction-redness,swelling; veins popping   Tomato Other (See Comments)    Severe acid reflux with all acid foods   Atogepant Other (See Comments)    Gi symptoms   Atorvastatin  Other (See Comments)    GI upset, even once weekly dosing   Nurtec [Rimegepant Sulfate] Other (See Comments)    GI upset    Adhesive [Tape] Rash    blisters   Azithromycin Rash   Gabapentin Other (See Comments)    Short term memory loss   Imitrex [Sumatriptan] Other (See Comments)    Rebound migraine    Lyrica [Pregabalin] Other (See Comments)    Short term memory loss   Macrodantin  [Nitrofurantoin ] Rash   Macrolides And Ketolides Rash   Nickel Swelling and Rash   Sulfa Antibiotics  Rash   Sulfasalazine Rash   Troleandomycin  Rash   Vancomycin  Itching and Rash    Developed Redman's, may require premedication and infusion rate reduction in the future

## 2023-12-17 ENCOUNTER — Ambulatory Visit: Admitting: Obstetrics

## 2023-12-17 ENCOUNTER — Encounter: Payer: Self-pay | Admitting: Obstetrics

## 2023-12-17 ENCOUNTER — Other Ambulatory Visit (HOSPITAL_COMMUNITY)
Admission: RE | Admit: 2023-12-17 | Discharge: 2023-12-17 | Disposition: A | Source: Ambulatory Visit | Attending: Obstetrics | Admitting: Obstetrics

## 2023-12-17 VITALS — BP 125/78 | HR 81 | Wt 200.0 lb

## 2023-12-17 DIAGNOSIS — Z124 Encounter for screening for malignant neoplasm of cervix: Secondary | ICD-10-CM

## 2023-12-17 DIAGNOSIS — D1779 Benign lipomatous neoplasm of other sites: Secondary | ICD-10-CM

## 2023-12-17 DIAGNOSIS — Z1151 Encounter for screening for human papillomavirus (HPV): Secondary | ICD-10-CM | POA: Insufficient documentation

## 2023-12-17 DIAGNOSIS — Z01419 Encounter for gynecological examination (general) (routine) without abnormal findings: Secondary | ICD-10-CM | POA: Insufficient documentation

## 2023-12-17 DIAGNOSIS — Z1231 Encounter for screening mammogram for malignant neoplasm of breast: Secondary | ICD-10-CM

## 2023-12-17 DIAGNOSIS — N951 Menopausal and female climacteric states: Secondary | ICD-10-CM

## 2023-12-17 DIAGNOSIS — N952 Postmenopausal atrophic vaginitis: Secondary | ICD-10-CM

## 2023-12-17 MED ORDER — ESTRADIOL 10 MCG VA TABS: 1.0000 | ORAL_TABLET | VAGINAL | 3 refills | Status: DC

## 2023-12-17 NOTE — Patient Instructions (Signed)
 Preventive Care 58-57 Years Old, Female  Preventive care refers to lifestyle choices and visits with your health care provider that can promote health and wellness. Preventive care visits are also called wellness exams.  What can I expect for my preventive care visit?  Counseling  Your health care provider may ask you questions about your:  Medical history, including:  Past medical problems.  Family medical history.  Pregnancy history.  Current health, including:  Menstrual cycle.  Method of birth control.  Emotional well-being.  Home life and relationship well-being.  Sexual activity and sexual health.  Lifestyle, including:  Alcohol, nicotine or tobacco, and drug use.  Access to firearms.  Diet, exercise, and sleep habits.  Work and work Astronomer.  Sunscreen use.  Safety issues such as seatbelt and bike helmet use.  Physical exam  Your health care provider will check your:  Height and weight. These may be used to calculate your BMI (body mass index). BMI is a measurement that tells if you are at a healthy weight.  Waist circumference. This measures the distance around your waistline. This measurement also tells if you are at a healthy weight and may help predict your risk of certain diseases, such as type 2 diabetes and high blood pressure.  Heart rate and blood pressure.  Body temperature.  Skin for abnormal spots.  What immunizations do I need?    Vaccines are usually given at various ages, according to a schedule. Your health care provider will recommend vaccines for you based on your age, medical history, and lifestyle or other factors, such as travel or where you work.  What tests do I need?  Screening  Your health care provider may recommend screening tests for certain conditions. This may include:  Lipid and cholesterol levels.  Diabetes screening. This is done by checking your blood sugar (glucose) after you have not eaten for a while (fasting).  Pelvic exam and Pap test.  Hepatitis B test.  Hepatitis C  test.  HIV (human immunodeficiency virus) test.  STI (sexually transmitted infection) testing, if you are at risk.  Lung cancer screening.  Colorectal cancer screening.  Mammogram. Talk with your health care provider about when you should start having regular mammograms. This may depend on whether you have a family history of breast cancer.  BRCA-related cancer screening. This may be done if you have a family history of breast, ovarian, tubal, or peritoneal cancers.  Bone density scan. This is done to screen for osteoporosis.  Talk with your health care provider about your test results, treatment options, and if necessary, the need for more tests.  Follow these instructions at home:  Eating and drinking    Eat a diet that includes fresh fruits and vegetables, whole grains, lean protein, and low-fat dairy products.  Take vitamin and mineral supplements as recommended by your health care provider.  Do not drink alcohol if:  Your health care provider tells you not to drink.  You are pregnant, may be pregnant, or are planning to become pregnant.  If you drink alcohol:  Limit how much you have to 0-1 drink a day.  Know how much alcohol is in your drink. In the [place] one drink equals one 12 oz bottle of beer (355 mL), one 5 oz glass of wine (148 mL), or one 1 oz glass of hard liquor (44 mL).  Lifestyle  Brush your teeth every morning and night with fluoride toothpaste. Floss one time each day.  Exercise for at least  30 minutes 5 or more days each week.  Do not use any products that contain nicotine or tobacco. These products include cigarettes, chewing tobacco, and vaping devices, such as e-cigarettes. If you need help quitting, ask your health care provider.  Do not use drugs.  If you are sexually active, practice safe sex. Use a condom or other form of protection to prevent STIs.  If you do not wish to become pregnant, use a form of birth control. If you plan to become pregnant, see your health care provider for a  prepregnancy visit.  Take aspirin only as told by your health care provider. Make sure that you understand how much to take and what form to take. Work with your health care provider to find out whether it is safe and beneficial for you to take aspirin daily.  Find healthy ways to manage stress, such as:  Meditation, yoga, or listening to music.  Journaling.  Talking to a trusted person.  Spending time with friends and family.  Minimize exposure to UV radiation to reduce your risk of skin cancer.  Safety  Always wear your seat belt while driving or riding in a vehicle.  Do not drive:  If you have been drinking alcohol. Do not ride with someone who has been drinking.  When you are tired or distracted.  While texting.  If you have been using any mind-altering substances or drugs.  Wear a helmet and other protective equipment during sports activities.  If you have firearms in your house, make sure you follow all gun safety procedures.  Seek help if you have been physically or sexually abused.  What's next?  Visit your health care provider once a year for an annual wellness visit.  Ask your health care provider how often you should have your eyes and teeth checked.  Stay up to date on all vaccines.  This information is not intended to replace advice given to you by your health care provider. Make sure you discuss any questions you have with your health care provider.  Document Revised: 06/15/2020 Document Reviewed: 06/15/2020  Elsevier Patient Education  2024 [employer].

## 2023-12-18 ENCOUNTER — Other Ambulatory Visit: Payer: Self-pay | Admitting: Family Medicine

## 2023-12-18 DIAGNOSIS — Z1231 Encounter for screening mammogram for malignant neoplasm of breast: Secondary | ICD-10-CM

## 2023-12-20 LAB — CYTOLOGY - PAP
Comment: NEGATIVE
Diagnosis: NEGATIVE
High risk HPV: NEGATIVE

## 2024-01-08 ENCOUNTER — Encounter: Payer: Self-pay | Admitting: Sleep Medicine

## 2024-01-08 ENCOUNTER — Ambulatory Visit: Admitting: Sleep Medicine

## 2024-01-08 VITALS — BP 120/80 | HR 80 | Temp 98.4°F | Ht 67.0 in | Wt 199.2 lb

## 2024-01-08 DIAGNOSIS — G43809 Other migraine, not intractable, without status migrainosus: Secondary | ICD-10-CM

## 2024-01-08 DIAGNOSIS — G4733 Obstructive sleep apnea (adult) (pediatric): Secondary | ICD-10-CM

## 2024-01-08 NOTE — Progress Notes (Signed)
 "       Name:Karen Aguilar MRN: [id number] DOB: 1965-01-11   CHIEF COMPLAINT:  PAP F/U   HISTORY OF PRESENT ILLNESS: Karen Aguilar is a 59 y.o. w/ a h/o OSA, anxiety, chronic back pain, trigeminal neuralgia, hypothyroidism and obesity who presents for PAP F/U visit. Reports difficulty using PAP therapy due to significant trigeminal neuralgia and migraines. She is currently using the Bleep nasal pillow mask, which causes significant air leaks. States that she would like to see a sleep neurologist to help with neurological symptoms as well.     EPWORTH SLEEP SCORE    06/04/2023    2:00 PM  Results of the Epworth flowsheet  Sitting and reading 0  Watching TV 1  Sitting, inactive in a public place (e.g. a theatre or a meeting) 0  As a passenger in a car for an hour without a break 1  Lying down to rest in the afternoon when circumstances permit 2  Sitting and talking to someone 0  Sitting quietly after a lunch without alcohol  1  In a car, while stopped for a few minutes in traffic 0  Total score 5    PAST MEDICAL HISTORY :   has a past medical history of Abnormal MRI scan, bone (2012), Acute gastroenteropathy due to Norovirus (02/19/2021), Agnosia for temperature, Anemia, Anxiety, Anxiety and depression, Arachnoiditis, Arthritis, Chronic pain, COVID-19 virus infection (09/2018), DDD (degenerative disc disease), cervical, DDD (degenerative disc disease), lumbar, Depression, Depression with anxiety, Diverticulosis, Eczema, Family history of adverse reaction to anesthesia, Fibromyalgia, Foot drop, Gallstones, GERD (gastroesophageal reflux disease), Headache(784.0), Hemorrhoid, Hemorrhoids, History of kidney stones, Hyperlipidemia, Hypothyroidism (1990s), IBS (irritable bowel syndrome), IBS (irritable bowel syndrome), Incontinence of urine, Iron deficiency, Left upper lobe pneumonia (06/02/2023), Liver hemangioma, Memory change, Migraines, Migraines, Mixed urge and stress incontinence (2008),  Neurogenic bladder (2008), Neuropathy of lower extremity (2011), Nocturia, Notalgia paresthetica, Osteoarthritis (12/2013), PONV (postoperative nausea and vomiting), Pulmonary nodule (04/2012), Sepsis due to pneumonia (HCC) (05/19/2023), Sleep apnea, Urine frequency, and Vitamin D  deficiency.  has a past surgical history that includes LASIK (Bilateral); Cholecystectomy (2001); Pubovaginal sling (2008); Shoulder Closed Reduction (Left, 12/18/2010); Back surgery; Back surgery (02/16/2011); Anterior cervical decomp/discectomy fusion; Cesarean section (1996; 2001); Lumbar wound debridement (03/15/2011); Spine surgery; PFT (01/28/2013); Esophagogastroduodenoscopy (03/2013); Esophagogastroduodenoscopy (12/2014); Tonsillectomy (1972); Mandible reconstruction (1986); Colonoscopy (10/2016); Hemorrhoid banding; Spinal cord stimulator insertion (N/A, 08/19/2015); Esophagogastroduodenoscopy (10/2015); Esophagogastroduodenoscopy (10/2016); Esophageal manometry (06/2017); Anterior cervical decomp/discectomy fusion (07/2017); Colonoscopy (03/2019); Esophagogastroduodenoscopy (03/2019); Artery Biopsy (Left, 09/02/2019); Esophagogastroduodenoscopy (09/2020); and Esophagogastroduodenoscopy (08/31/2020). Prior to Admission medications   Medication Sig Start Date End Date Taking? Authorizing Provider  Ascorbic Acid  (VITAMIN C  PO) Take 1 tablet by mouth as needed (for immune system support).    Yes [provider]  Bacillus Coagulans-Inulin (PROBIOTIC-PREBIOTIC PO) Take 1 capsule by mouth daily. 07/15/23  Yes [provider]  baclofen  (LIORESAL ) 10 MG tablet Take 0.5 tablets (5 mg total) by mouth 3 (three) times daily as needed. 07/14/21  Yes Cleatus Arlyss RAMAN, MD  calcium  carbonate (TUMS EX) 750 MG chewable tablet Chew 2-3 tablets by mouth 2 (two) times daily as needed.   Yes [provider]  camphor-menthol  (SARNA) lotion Apply 1 application topically at bedtime as needed for itching.    Yes [provider]  Carboxymethylcellulose Sodium (EYE DROPS OP) Apply to eye. PRGF eye drops. 1 drop 4-6 times a day in left eye   Yes [provider]  diclofenac  sodium (VOLTAREN ) 1 % GEL APPLY THIN  LAYER EXTERNALLY TO THE AFFECTED AREA UP TO FOUR TIMES DAILY AS NEEDED 12/31/17  Yes Vernetta Lonni GRADE, MD  docusate sodium  (COLACE) 100 MG capsule Take 100 mg by mouth 2 (two) times daily. As needed   Yes [provider]  LORazepam  (ATIVAN ) 1 MG tablet Take 0.5 tablets (0.5 mg total) by mouth 2 (two) times daily as needed for anxiety or sleep. 08/07/23  Yes Rilla Baller, MD  NUCYNTA  ER 250 MG TB12 SMARTSIG:1 Tablet(s) By Mouth Every 12 Hours 07/13/21  Yes [provider]  ondansetron  (ZOFRAN -ODT) 4 MG disintegrating tablet Take 1 tablet (4 mg total) by mouth every 8 (eight) hours as needed for nausea or vomiting. 05/22/23  Yes Rilla Baller, MD  tacrolimus  (PROTOPIC ) 0.03 % ointment Apply to eye. Custom compound 03/20/23 03/19/24 Yes [provider]  thyroid  (ARMOUR) 90 MG tablet Take 1 tablet (90 mg total) by mouth daily. One day a week take 1/2 tablet 12/02/20  Yes Rilla Baller, MD  TYRVAYA  0.03 MG/ACT SOLN Place 1 spray into both nostrils 2 (two) times daily.   Yes [provider]   Allergies  Allergen Reactions   Augmentin  [Amoxicillin -Pot Clavulanate] Swelling    Lip swelling and irritation.    Fluconazole  Swelling    Lips - swelling, bleeding, blisters, cracking, peeling   Erythromycin Nausea And Vomiting   Influenza Vaccines Hives and Swelling    Redness, heat, and extreme swelling at site   Levothyroxine  Hives   Thimerosal (Thiomersal) Swelling    Local reaction-redness,swelling; veins popping   Tomato Other (See Comments)    Severe acid reflux with all acid foods   Atogepant Other (See Comments)    Gi symptoms   Atorvastatin  Other (See Comments)    GI upset, even once weekly dosing   Nurtec [Rimegepant Sulfate] Other (See  Comments)    GI upset    Adhesive [Tape] Rash    blisters   Azithromycin Rash   Gabapentin Other (See Comments)    Short term memory loss   Imitrex [Sumatriptan] Other (See Comments)    Rebound migraine    Lyrica [Pregabalin] Other (See Comments)    Short term memory loss   Macrodantin  Grete.goodwill ] Rash   Macrolides And Ketolides Rash   Nickel Swelling and Rash   Sulfa Antibiotics Rash   Sulfasalazine Rash   Troleandomycin Rash   Vancomycin  Itching and Rash    Developed Redman's, may require premedication and infusion rate reduction in the future    FAMILY HISTORY:  family history includes Alzheimer's disease in her maternal grandmother; Breast cancer in her maternal aunt; CAD (age of onset: 88) in her maternal grandfather; Cancer in her father, maternal aunt, maternal aunt, paternal grandfather, paternal grandmother, and another family member; Colon cancer in her paternal grandmother; Diabetes in her brother and daughter; Dysphagia in her mother; Heart attack in her maternal grandfather; Heart disease in her maternal grandfather; Irritable bowel syndrome in her mother; Lung cancer in her paternal grandfather; Lung cancer (age of onset: 70) in her father; Thyroid  disease in her cousin and mother. SOCIAL HISTORY:  reports that she has never smoked. She has been exposed to tobacco smoke. She has never used smokeless tobacco. She reports that she does not currently use alcohol  after a past usage of about 1.0 standard drink of alcohol  per week. She reports current drug use.   Review of Systems:  Gen:  Denies  fever, sweats, chills weight loss  HEENT: Denies blurred vision, double vision, ear pain, eye  pain, hearing loss, nose bleeds, sore throat Cardiac:  No dizziness, chest pain or heaviness, chest tightness,edema, No JVD Resp:   No cough, -sputum production, -shortness of breath,-wheezing, -hemoptysis,  Gi: Denies swallowing difficulty, stomach pain, nausea or vomiting, diarrhea,  constipation, bowel incontinence Gu:  Denies bladder incontinence, burning urine Ext:   Denies Joint pain, stiffness or swelling Skin: Denies  skin rash, easy bruising or bleeding or hives Endoc:  Denies polyuria, polydipsia , polyphagia or weight change Psych:   Denies depression, insomnia or hallucinations  Other:  All other systems negative  VITAL SIGNS: BP 120/80   Pulse 80   Temp 98.4 F (36.9 C)   Ht 5' 7 (1.702 m)   Wt 199 lb 3.2 oz (90.4 kg)   LMP 10/04/2014 Comment: per labs  SpO2 97%   BMI 31.20 kg/m     Physical Examination:   General Appearance: No distress  EYES PERRLA, EOM intact.   NECK Supple, No JVD Pulmonary: normal breath sounds, No wheezing.  CardiovascularNormal S1,S2.  No m/r/g.   Abdomen: Benign, Soft, non-tender. Skin:   warm, no rashes, no ecchymosis  Extremities: normal, no cyanosis, clubbing. Neuro:without focal findings,  speech normal  PSYCHIATRIC: Mood, affect within normal limits.   ASSESSMENT AND PLAN  OSA Patient has been referred to a sleep neurologist by her PCP for further evaluation and management of OSA and headaches. Discussed the consequences of untreated sleep apnea. Advised not to drive drowsy for safety of patient and others.    Migraine disorder Patient will be establishing care with a sleep neurologist for further management.    Patient  satisfied with Plan of action and management. All questions answered  I spent a total of 26 minutes reviewing chart data, face-to-face evaluation with the patient, counseling and coordination of care as detailed above.    Larwence Tu, M.D.  Sleep Medicine  Pulmonary & Critical Care Medicine        "

## 2024-01-14 ENCOUNTER — Encounter: Payer: Self-pay | Admitting: Family Medicine

## 2024-01-14 ENCOUNTER — Ambulatory Visit: Admitting: Family Medicine

## 2024-01-14 VITALS — BP 102/78 | HR 82 | Temp 97.6°F | Ht 67.0 in | Wt 199.0 lb

## 2024-01-14 DIAGNOSIS — F411 Generalized anxiety disorder: Secondary | ICD-10-CM | POA: Diagnosis not present

## 2024-01-14 DIAGNOSIS — N644 Mastodynia: Secondary | ICD-10-CM

## 2024-01-14 DIAGNOSIS — K5909 Other constipation: Secondary | ICD-10-CM | POA: Diagnosis not present

## 2024-01-14 DIAGNOSIS — G4733 Obstructive sleep apnea (adult) (pediatric): Secondary | ICD-10-CM | POA: Diagnosis not present

## 2024-01-14 DIAGNOSIS — H168 Other keratitis: Secondary | ICD-10-CM

## 2024-01-14 DIAGNOSIS — E063 Autoimmune thyroiditis: Secondary | ICD-10-CM

## 2024-01-14 DIAGNOSIS — D179 Benign lipomatous neoplasm, unspecified: Secondary | ICD-10-CM

## 2024-01-14 DIAGNOSIS — G500 Trigeminal neuralgia: Secondary | ICD-10-CM

## 2024-01-14 DIAGNOSIS — K219 Gastro-esophageal reflux disease without esophagitis: Secondary | ICD-10-CM | POA: Diagnosis not present

## 2024-01-14 DIAGNOSIS — G4734 Idiopathic sleep related nonobstructive alveolar hypoventilation: Secondary | ICD-10-CM | POA: Diagnosis not present

## 2024-01-14 NOTE — Patient Instructions (Addendum)
 Consider switching soluble fiber supplement from Benefiber to Citucel.  I will order diagnostic mammogram and ultrasound to check possible breast lipomas.  Good to see you today  Return in 3 months for follow up visit

## 2024-01-14 NOTE — Progress Notes (Unsigned)
 [id number] Ph: [phone] Fax: [phone]   Patient ID: Karen Aguilar, female    DOB: August 20, 1965, 59 y.o.   MRN: [id number]  This visit was conducted in person.  BP 102/78 (BP Location: Left Arm, Patient Position: Sitting, Cuff Size: Normal)   Pulse 82   Temp 97.6 F (36.4 C) (Oral)   Ht 5' 7 (1.702 m)   Wt 199 lb (90.3 kg)   LMP 10/04/2014 Comment: per labs  SpO2 100%   BMI 31.17 kg/m    CC: 4 mo f/u visit  Subjective:   HPI: Karen Aguilar is a 59 y.o. female presenting on 01/14/2024 for Medical Management of Chronic Issues (Was rx hormones by obgyn but stopped taking them because they made her bloated and nauseous//No diet changes but no fried food, pt has been having GI issues//Would like to know if vaccines are recommend due to allergy)   OSA - has seen Dr Jess at [facility] in [place]. Has had trouble tolerating all masks tried, now using Bleep nasal pillow. CPAP use can exacerbate migraines and trigeminal neuralgia. Planning to establish with sleep neurologist for OSA management. She is using [employer] myAir sleep app to monitor her sleep.   She is slowly tapering dose of lorazepam  down to 1/4 tablet bid per pain management recommendations (Dr Orlando).   Saw [facility] for chronic gastritis/GERD and chronic constipation - managing with carafate , famotidine  20mg  bid. She wants to avoid PPI. Continues benefiber and colace. To consider trial Voquenza. Planning colonoscopy 03/2024.   Hypothyroid - sees Dr Damian last seen 12/2023. Continues armour thyroid .   H/o lipomas predominantly to bilateral upper abdomen but most recently having tender one to left flank associated with cramping abdominal pain.  Notes also bilateral breast tenderness associated with previously noted lipomas. Has been referred by GYN to general surgeon to consider lipoma excisions.   Notes worsening headaches without symptoms of cervical radiculopathy.      Relevant past medical, surgical, family and  social history reviewed and updated as indicated. Interim medical history since our last visit reviewed. Allergies and medications reviewed and updated. Outpatient Medications Prior to Visit  Medication Sig Dispense Refill   Ascorbic Acid  (VITAMIN C  PO) Take 1 tablet by mouth as needed (for immune system support).      Bacillus Coagulans-Inulin (PROBIOTIC-PREBIOTIC PO) Take 1 capsule by mouth daily.     calcium  carbonate (TUMS EX) 750 MG chewable tablet Chew 2-3 tablets by mouth 2 (two) times daily as needed.     camphor-menthol  (SARNA) lotion Apply 1 application topically at bedtime as needed for itching.      Carboxymethylcellulose Sodium (EYE DROPS OP) Apply to eye. PRGF eye drops. 1 drop 4-6 times a day in left eye     diclofenac  sodium (VOLTAREN ) 1 % GEL APPLY THIN LAYER EXTERNALLY TO THE AFFECTED AREA UP TO FOUR TIMES DAILY AS NEEDED 300 g 0   docusate sodium  (COLACE) 100 MG capsule Take 100 mg by mouth 2 (two) times daily. As needed     famotidine  (PEPCID ) 20 MG tablet Take 20 mg by mouth 2 (two) times daily.     NUCYNTA  ER 250 MG TB12 SMARTSIG:1 Tablet(s) By Mouth Every 12 Hours     ondansetron  (ZOFRAN -ODT) 4 MG disintegrating tablet Take 1 tablet (4 mg total) by mouth every 8 (eight) hours as needed for nausea or vomiting. 20 tablet 0   propranolol  ER (INDERAL  LA) 60 MG 24 hr capsule Take 1 capsule (60 mg  total) by mouth daily. For migraine prevention 90 capsule 3   sucralfate  (CARAFATE ) 1 g tablet Take 1 tablet (1 g total) by mouth 2 (two) times daily before a meal. As needed 120 tablet 0   tacrolimus  (PROTOPIC ) 0.03 % ointment Apply to eye. Custom compound     thyroid  (ARMOUR) 90 MG tablet Take 1 tablet (90 mg total) by mouth daily. One day a week take 1/2 tablet     TYRVAYA  0.03 MG/ACT SOLN Place 1 spray into both nostrils 2 (two) times daily.     baclofen  (LIORESAL ) 10 MG tablet Take 0.5 tablets (5 mg total) by mouth 3 (three) times daily as needed.     LORazepam  (ATIVAN ) 1 MG tablet  TAKE 1/2 TABLET BY MOUTH TWICE DAILY AS NEEDED (Patient taking differently: Take 1 mg by mouth 2 (two) times daily. 1/4 of a tablet twice a day) 30 tablet 1   baclofen  (LIORESAL ) 10 MG tablet Take 1 tablet (10 mg total) by mouth 3 (three) times daily as needed.     LORazepam  (ATIVAN ) 0.5 MG tablet Take 0.5 tablets (0.25 mg total) by mouth in the morning and at bedtime. Take 1/4 tablet at a time     Estradiol  (VAGIFEM ) 10 MCG TABS vaginal tablet Place 1 tablet (10 mcg total) vaginally as directed. Insert nightly for first two weeks. Then decrease to twice weekly. (Patient not taking: Reported on 01/14/2024) 24 tablet 3   No facility-administered medications prior to visit.     Per HPI unless specifically indicated in ROS section below Review of Systems  Objective:  BP 102/78 (BP Location: Left Arm, Patient Position: Sitting, Cuff Size: Normal)   Pulse 82   Temp 97.6 F (36.4 C) (Oral)   Ht 5' 7 (1.702 m)   Wt 199 lb (90.3 kg)   LMP 10/04/2014 Comment: per labs  SpO2 100%   BMI 31.17 kg/m   Wt Readings from Last 3 Encounters:  01/14/24 199 lb (90.3 kg)  01/08/24 199 lb 3.2 oz (90.4 kg)  12/17/23 200 lb (90.7 kg)      Physical Exam Vitals and nursing note reviewed.  Constitutional:      Appearance: Normal appearance. She is not ill-appearing.  HENT:     Head: Normocephalic and atraumatic.     Mouth/Throat:     Mouth: Mucous membranes are moist.     Pharynx: Oropharynx is clear. No oropharyngeal exudate or posterior oropharyngeal erythema.  Eyes:     Extraocular Movements: Extraocular movements intact.     Pupils: Pupils are equal, round, and reactive to light.  Cardiovascular:     Rate and Rhythm: Normal rate and regular rhythm.     Pulses: Normal pulses.     Heart sounds: Normal heart sounds. No murmur heard. Pulmonary:     Effort: Pulmonary effort is normal. No respiratory distress.     Breath sounds: Normal breath sounds. No wheezing, rhonchi or rales.  Abdominal:      General: Bowel sounds are normal. There is no distension.     Palpations: Abdomen is soft. There is no mass.     Tenderness: There is no abdominal tenderness. There is no guarding or rebound.     Hernia: No hernia is present.     Comments: No current abdominal pain  Musculoskeletal:     Right lower leg: No edema.     Left lower leg: No edema.  Skin:    General: Skin is warm and dry.  Findings: No rash.  Neurological:     Mental Status: She is alert.  Psychiatric:        Mood and Affect: Mood normal.        Behavior: Behavior normal.       Results for orders placed or performed in visit on 12/17/23  Cytology - PAP   Collection Time: 12/17/23  4:08 PM  Result Value Ref Range   High risk HPV Negative    Adequacy      Satisfactory for evaluation. The presence or absence of an   Adequacy      endocervical/transformation zone component cannot be determined because   Adequacy of atrophy.    Diagnosis      - Negative for intraepithelial lesion or malignancy (NILM)   Comment Normal Reference Range HPV - Negative     Assessment & Plan:   Problem List Items Addressed This Visit     Hypothyroidism due to Hashimoto's thyroiditis   GERD (gastroesophageal reflux disease)   Now back on peepcid 20mg  BID with PRN carafate  (worsens constipation) She wants to avoid PPI Discussing possible Voquenza trial      OSA treated with BiPAP   Saw pulmonology - has had trouble tolerating CPAP/BiPAP machine.  Pending eval by sleep neurologist.  Currently using Bleep nasal mask.       Chronic constipation   Has been recommended soluble fiber supplement - discussed options, rec try citrucel       GAD (generalized anxiety disorder)   She continues slow taper off lorazepam  - down to 1/4 pill BID of 1mg  dose.       Relevant Medications   LORazepam  (ATIVAN ) 0.5 MG tablet   Trigeminal neuralgia   H/o this to left side of face, thought contributing to neurotrophic keratitis on that side,  followed regularly by neuro-ophthalmology      Relevant Medications   baclofen  (LIORESAL ) 10 MG tablet   LORazepam  (ATIVAN ) 0.5 MG tablet   Neurotrophic keratitis of left eye   Nocturnal hypoxia   Multiple lipomas - Primary   Painful, suspected abdominal wall lipomas s/p soft tissue ultrasound in 2025, pending gen surgery eval.  Discussed limited abdominal MRI for further evaluation - will first await surgical eval.  Notes similar intermittently painful lumps to both breasts - see below.       Breast pain   Presumed due to painful lipomas bilaterally.  She has upcoming screening mammogram - will order dx mammo/US  for further evaluation of breast pain thought due to lipomas.       Relevant Orders   MM 3D DIAGNOSTIC MAMMOGRAM BILATERAL BREAST   US  BREAST COMPLETE UNI RIGHT INC AXILLA   US  BREAST COMPLETE UNI LEFT INC AXILLA     No orders of the defined types were placed in this encounter.   Orders Placed This Encounter  Procedures   MM 3D DIAGNOSTIC MAMMOGRAM BILATERAL BREAST    Standing Status:   Future    Expiration Date:   01/15/2025    Reason for Exam (SYMPTOM  OR DIAGNOSIS REQUIRED):   bilateral breast pain eval lipomas    Is the patient pregnant?:   No    Preferred imaging location?:   [facility]   US  BREAST COMPLETE UNI RIGHT INC AXILLA    Standing Status:   Future    Expiration Date:   01/15/2025    Reason for Exam (SYMPTOM  OR DIAGNOSIS REQUIRED):   bilat breast pain eval lipoma    Preferred imaging  location?:   [facility]   US  BREAST COMPLETE UNI LEFT INC AXILLA    Standing Status:   Future    Expiration Date:   01/15/2025    Reason for Exam (SYMPTOM  OR DIAGNOSIS REQUIRED):   bilateral breast pain eval for lipomas    Preferred imaging location?:   [facility]    Patient Instructions  Consider switching soluble fiber supplement from Benefiber to Citucel.  I will order diagnostic mammogram and ultrasound to check possible breast lipomas.   Good to see you today  Return in 3 months for follow up visit   Follow up plan: Return in about 3 months (around 04/13/2024) for follow up visit.  Anton Blas, MD

## 2024-01-16 DIAGNOSIS — N644 Mastodynia: Secondary | ICD-10-CM | POA: Insufficient documentation

## 2024-01-16 NOTE — Assessment & Plan Note (Addendum)
 Has been recommended soluble fiber supplement - discussed options, rec try citrucel

## 2024-01-16 NOTE — Assessment & Plan Note (Addendum)
 Saw pulmonology - has had trouble tolerating CPAP/BiPAP machine.  Pending eval by sleep neurologist.  Currently using Bleep nasal mask.

## 2024-01-16 NOTE — Assessment & Plan Note (Signed)
 Presumed due to painful lipomas bilaterally.  She has upcoming screening mammogram - will order dx mammo/US  for further evaluation of breast pain thought due to lipomas.

## 2024-01-16 NOTE — Assessment & Plan Note (Addendum)
 Now back on peepcid 20mg  BID with PRN carafate  (worsens constipation) She wants to avoid PPI Discussing possible Voquenza trial

## 2024-01-16 NOTE — Assessment & Plan Note (Signed)
 H/o this to left side of face, thought contributing to neurotrophic keratitis on that side, followed regularly by neuro-ophthalmology

## 2024-01-16 NOTE — Assessment & Plan Note (Signed)
 She continues slow taper off lorazepam  - down to 1/4 pill BID of 1mg  dose.

## 2024-01-16 NOTE — Assessment & Plan Note (Addendum)
 Painful, suspected abdominal wall lipomas s/p soft tissue ultrasound in 2025, pending gen surgery eval.  Discussed limited abdominal MRI for further evaluation - will first await surgical eval.  Notes similar intermittently painful lumps to both breasts - see below.

## 2024-01-21 ENCOUNTER — Encounter

## 2024-01-23 NOTE — Addendum Note (Signed)
 Addended by: Susann Lawhorne on: 01/23/2024 12:32 PM   Modules accepted: Level of Service

## 2024-01-24 ENCOUNTER — Encounter: Payer: Self-pay | Admitting: Surgery

## 2024-01-24 ENCOUNTER — Ambulatory Visit
Admission: RE | Admit: 2024-01-24 | Discharge: 2024-01-24 | Disposition: A | Source: Ambulatory Visit | Attending: Family Medicine | Admitting: Family Medicine

## 2024-01-24 ENCOUNTER — Ambulatory Visit: Admitting: Surgery

## 2024-01-24 VITALS — BP 116/69 | HR 89 | Ht 67.0 in | Wt 197.0 lb

## 2024-01-24 DIAGNOSIS — N644 Mastodynia: Secondary | ICD-10-CM | POA: Insufficient documentation

## 2024-01-24 DIAGNOSIS — D171 Benign lipomatous neoplasm of skin and subcutaneous tissue of trunk: Secondary | ICD-10-CM | POA: Diagnosis not present

## 2024-01-24 DIAGNOSIS — D179 Benign lipomatous neoplasm, unspecified: Secondary | ICD-10-CM

## 2024-01-24 NOTE — Patient Instructions (Addendum)
Lipoma Removal  Lipoma removal is a surgical procedure to remove a lipoma, which is a noncancerous (benign) tumor that is made up of fat cells. Most lipomas are small and painless and do not require treatment. They can form in many areas of the body but are most common under the skin of the back, arms, shoulders, buttocks, and thighs. You may need lipoma removal if you have a lipoma that is large, growing, or causing discomfort. Lipoma removal may also be done for cosmetic reasons. Tell a health care provider about: Any allergies you have. All medicines you are taking, including vitamins, herbs, eye drops, creams, and over-the-counter medicines. Any problems you or family members have had with anesthetic medicines. Any bleeding problems you have. Any surgeries you have had. Any medical conditions you have. Whether you are pregnant or may be pregnant. What are the risks? Generally, this is a safe procedure. However, problems may occur, including: Infection. Bleeding. Scarring. Allergic reactions to medicines. Damage to nearby structures or organs, such as damage to nerves or blood vessels near the lipoma. What happens before the procedure? When to Stop Eating and Drinking Follow instructions from your health care provider about what you may eat and drink before your procedure. These may include: 8 hours before your procedure Stop eating most foods. Do not eat meat, fried foods, or fatty foods. Eat only light foods, such as toast or crackers. All liquids are okay except energy drinks and alcohol. 6 hours before your procedure Stop eating. Drink only clear liquids, such as water, clear fruit juice, black coffee, plain tea, and sports drinks. Do not drink energy drinks or alcohol. 2 hours before your procedure Stop drinking all liquids. You may be allowed to take medicines with small sips of water. If you do not follow your health care provider's instructions, your procedure may be  delayed or canceled. Medicines Ask your health care provider about: Changing or stopping your regular medicines. This is especially important if you are taking diabetes medicines or blood thinners. Taking medicines such as aspirin and ibuprofen. These medicines can thin your blood. Do not take these medicines unless your health care provider tells you to take them. Taking over-the-counter medicines, vitamins, herbs, and supplements. General instructions You will have a physical exam. Your health care provider will check the size of the lipoma and whether it can be removed easily. You may have a biopsy and imaging tests, such as X-rays, a CT scan, and an MRI. Do not use any products that contain nicotine or tobacco for at least 4 weeks before the procedure. These products include cigarettes, chewing tobacco, and vaping devices, such as e-cigarettes. If you need help quitting, ask your health care provider. Ask your health care provider: How your surgery site will be marked. What steps will be taken to help prevent infection. These may include: Washing skin with a germ-killing soap. Taking antibiotic medicine. If you will be going home right after the procedure, plan to have a responsible adult: Take you home from the hospital or clinic. You will not be allowed to drive. Care for you for the time you are told. What happens during the procedure?  An IV will be inserted into one of your veins. You will be given one or more of the following: A medicine to help you relax (sedative). A medicine to numb the area (local anesthetic). A medicine to make you fall asleep (general anesthetic). A medicine that is injected into an area of your body to  numb everything below the injection site (regional anesthetic). An incision will be made into the skin over the lipoma or very near the lipoma. The incision may be made in a natural skin line or crease. Tissues, nerves, and blood vessels near the lipoma will  be moved out of the way. The lipoma and the capsule that surrounds it will be separated from the surrounding tissues. The lipoma will be removed. The incision may be closed with stitches (sutures). A bandage (dressing) will be placed over the incision. The procedure may vary among health care providers and hospitals. What happens after the procedure? Your blood pressure, heart rate, breathing rate, and blood oxygen level will be monitored until you leave the hospital or clinic. If you were prescribed an antibiotic medicine, use it as told by your health care provider. Do not stop using the antibiotic even if you start to feel better. If you were given a sedative during the procedure, it can affect you for several hours. Do not drive or operate machinery until your health care provider says that it is safe. Where to find more information OrthoInfo: orthoinfo.aaos.org Summary Before the procedure, follow instructions from your health care provider about eating and drinking, and changing or stopping your regular medicines. This is especially important if you are taking diabetes medicines or blood thinners. After the lipoma is removed, the incision may be closed with stitches (sutures) and covered with a bandage (dressing). If you were given a sedative during the procedure, it can affect you for several hours. Do not drive or operate machinery until your health care provider says that it is safe. This information is not intended to replace advice given to you by your health care provider. Make sure you discuss any questions you have with your health care provider. Document Revised: 01/06/2021 Document Reviewed: 01/06/2021 Elsevier Patient Education  2024 [employer].

## 2024-01-24 NOTE — Progress Notes (Signed)
 " 01/24/2024  Reason for Visit:  Multiple lipomas  Requesting Provider:  Estil Mangle, MD  History of Present Illness: Karen Aguilar is a 59 y.o. female presenting for evaluation of multiple lipomas.  Patient reports noticing masses and bilateral lower chest areas and as well as her back.  These have been ongoing for some time but she reports that she has been having a lot of issues with pain related to these masses as well as muscle spasms.  Sometimes the pain is very severe that she is unable to take a deep breath because of it.  The pain sometimes goes from her back towards the left front.  She also reports that recently she had a flareup where an area on her side and back became very swollen and tender.  This has since subsided.  Denies any fevers, chills, chest pain, shortness of breath.  Past Medical History: Past Medical History:  Diagnosis Date   Abnormal MRI scan, bone 2012   abnormal marrow signal humeral head, glenoid and scapula - diffuse replacement of fatty marrow - likely benign, no need for further investigation ([place])   Acute gastroenteropathy due to Norovirus 02/19/2021   Agnosia for temperature    Right Leg   Anemia    etiology unknown-iron infusion in past,last 9/12   Anxiety    Anxiety and depression    Arachnoiditis    S1 nerve root, 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 [facility]   COVID-19 virus infection 09/2018   DDD (degenerative disc disease), cervical    DDD (degenerative disc disease), lumbar    s/p permanent nerve damage after back surgery   Depression    Depression with anxiety    Diverticulosis    Eczema    Family history of adverse reaction to anesthesia    Mother - nausea   Fibromyalgia    Foot drop    right - numbness, tingling - twisting   Gallstones    GERD (gastroesophageal reflux disease)    rare zantac  PRN N/V at times with this   Headache(784.0)    migraines   Hemorrhoid    Hemorrhoids     History of kidney stones    Hyperlipidemia    Hypothyroidism 1990s   hashimoto's thyroiditis   IBS (irritable bowel syndrome)    IBS (irritable bowel syndrome)    Incontinence of urine    s/p sling procedure-unresolved   Iron deficiency    h/o anemia, s/p iron infusion (2012)   Left upper lobe pneumonia 06/02/2023   Liver hemangioma     x2   Memory change    due to medications   Migraines    and h/o optical migraine   Migraines    Mixed urge and stress incontinence 2008   extensive workup including urodynamics, failed miltiple anticholinergics including myrbetriq and urogesic blue s/p PTNS, normal cystoscopy 2014, seen by Dr Gaston and Dr Penne   Neurogenic bladder 2008   after 13 spine surgeries with periph neuropathy and chronic LBP   Neuropathy of lower extremity 2011   Nocturia    Notalgia paresthetica    Osteoarthritis 12/2013   PONV (postoperative nausea and vomiting)    Pulmonary nodule 04/2012   7.89mm RLL nodule - resolved on imaging 12/2012   Sepsis due to pneumonia (HCC) 05/19/2023   Sleep apnea    mild-doesn't use cpap   Urine frequency    Vitamin D  deficiency  Past Surgical History: Past Surgical History:  Procedure Laterality Date   ANTERIOR CERVICAL DECOMP/DISCECTOMY FUSION     C3, 4, 5 - improved after surgery   ANTERIOR CERVICAL DECOMP/DISCECTOMY FUSION  07/2017   C6/7 Dr Samule   ARTERY BIOPSY Left 09/02/2019   Procedure: BIOPSY TEMPORAL ARTERY;  Surgeon: Rodolph Romano, MD;  Location: [facility] ORS;  Service: General;  Laterality: Left;   BACK SURGERY     multiple-L4-s1,hardware,fusion,removal,infection s/p 5/11 procedure   BACK SURGERY  02/16/2011   my 7th back OR; Procedure: Exploration of fusion removal of hardware L4-S1, harvesting of right iliac crest bone graft, redo posterior lateral fusion L4-5 using iliac crest bone graft BMP and master graft replacement of bilateral L4 screws with a Telfa tech 7.5 x 40 mm screw on the right and  8.5 x 40 mm screw on the left placement of a large Hemovac drain   CESAREAN SECTION  1996; 2001   x 2   CHOLECYSTECTOMY  2001   COLONOSCOPY  10/2016   rpt 10 yrs [place])   COLONOSCOPY  03/2019   1 polyp, biopsies taken ([place])   ESOPHAGEAL MANOMETRY  06/2017   WNL (Dr Willma)   ESOPHAGOGASTRODUODENOSCOPY  03/2013   early esophageal stricture dilated, o/w WNL [place])   ESOPHAGOGASTRODUODENOSCOPY  12/2014   WNL, s/p empiric dilation of esophagus O[place]ta)   ESOPHAGOGASTRODUODENOSCOPY  10/2015   WNL, biopsies WNL as well (minimal esophagitis with reactive changes) [place])   ESOPHAGOGASTRODUODENOSCOPY  10/2016   biopsy suggestive of ulcer ([place])   ESOPHAGOGASTRODUODENOSCOPY  03/2019   mild portal hypertensive gastropathy, gastric erosion without bleeding, duodenal diverticulum ([place])   ESOPHAGOGASTRODUODENOSCOPY  09/2020   normal esophagus with mild edema, no gastritis, H pylori neg (Dr Tanda [facility])   ESOPHAGOGASTRODUODENOSCOPY  08/31/2020   biopsy with reactive gastropathy ([facility])   HEMORRHOID BANDING     LASIK Bilateral    LUMBAR WOUND DEBRIDEMENT  03/15/2011   Procedure: LUMBAR WOUND DEBRIDEMENT;  Surgeon: Arley SHAUNNA Helling, MD;  Lumbar Wound Debridement   MANDIBLE RECONSTRUCTION  1986   PFT  01/28/2013   WNL   PUBOVAGINAL SLING  2008   midurethral sling   SHOULDER CLOSED REDUCTION Left 12/18/2010   Procedure: CLOSED MANIPULATION SHOULDER;  Surgeon: Reyes JAYSON Billing; labrial debridement   SPINAL CORD STIMULATOR INSERTION N/A 08/19/2015   Procedure: LUMBAR SPINAL CORD STIMULATOR INSERTION;  Surgeon: Fairy Levels, MD;  Location: [facility];  Service: Neurosurgery;  Laterality: N/A;  LUMBAR SPINAL CORD STIMULATOR INSERTION   SPINE SURGERY     TONSILLECTOMY  1972   as a child; then they grew back    Home Medications: Prior to Admission medications  Medication Sig Start Date End Date Taking? Authorizing Provider  Ascorbic Acid  (VITAMIN C  PO) Take 1 tablet by mouth as  needed (for immune system support).    Yes [provider]  Bacillus Coagulans-Inulin (PROBIOTIC-PREBIOTIC PO) Take 1 capsule by mouth daily. 07/15/23  Yes [provider]  baclofen  (LIORESAL ) 10 MG tablet Take 1 tablet (10 mg total) by mouth 3 (three) times daily as needed. 01/14/24  Yes Rilla Baller, MD  calcium  carbonate (TUMS EX) 750 MG chewable tablet Chew 2-3 tablets by mouth 2 (two) times daily as needed.   Yes [provider]  camphor-menthol  VIKKI) lotion Apply 1 application topically at bedtime as needed for itching.    Yes [provider]  Carboxymethylcellulose Sodium (EYE DROPS OP) Apply to eye. PRGF eye drops. 1 drop 4-6 times a  day in left eye   Yes [provider]  diclofenac  sodium (VOLTAREN ) 1 % GEL APPLY THIN LAYER EXTERNALLY TO THE AFFECTED AREA UP TO FOUR TIMES DAILY AS NEEDED 12/31/17  Yes Vernetta Lonni GRADE, MD  docusate sodium  (COLACE) 100 MG capsule Take 100 mg by mouth 2 (two) times daily. As needed   Yes [provider]  famotidine  (PEPCID ) 20 MG tablet Take 20 mg by mouth 2 (two) times daily. 01/07/24  Yes [provider]  LORazepam  (ATIVAN ) 0.5 MG tablet Take 0.5 tablets (0.25 mg total) by mouth in the morning and at bedtime. Take 1/4 tablet at a time 01/16/24  Yes Rilla Baller, MD  NUCYNTA  ER 250 MG TB12 SMARTSIG:1 Tablet(s) By Mouth Every 12 Hours 07/13/21  Yes [provider]  ondansetron  (ZOFRAN -ODT) 4 MG disintegrating tablet Take 1 tablet (4 mg total) by mouth every 8 (eight) hours as needed for nausea or vomiting. 05/22/23  Yes Rilla Baller, MD  propranolol  ER (INDERAL  LA) 60 MG 24 hr capsule Take 1 capsule (60 mg total) by mouth daily. For migraine prevention 10/14/23  Yes Rilla Baller, MD  sucralfate  (CARAFATE ) 1 g tablet Take 1 tablet (1 g total) by mouth 2 (two) times daily before a meal. As needed 10/03/23  Yes Rilla Baller, MD  tacrolimus  (PROTOPIC ) 0.03 % ointment  Apply to eye. Custom compound 03/20/23 03/19/24 Yes [provider]  thyroid  (ARMOUR) 90 MG tablet Take 1 tablet (90 mg total) by mouth daily. One day a week take 1/2 tablet 12/02/20  Yes Rilla Baller, MD  TYRVAYA  0.03 MG/ACT SOLN Place 1 spray into both nostrils 2 (two) times daily.   Yes [provider]    Allergies: Allergies[1]  Social History:  reports that she has never smoked. She has been exposed to tobacco smoke. She has never used smokeless tobacco. She reports that she does not currently use alcohol  after a past usage of about 1.0 standard drink of alcohol  per week. She reports that she does not use drugs.   Family History: Family History  Problem Relation Age of Onset   Thyroid  disease Mother        ?empty sella   Irritable bowel syndrome Mother    Dysphagia Mother    Lung cancer Father 47        (smoker)   Cancer Father    Diabetes Brother    Alzheimer's disease Maternal Grandmother    CAD Maternal Grandfather 70       MI   Heart disease Maternal Grandfather    Heart attack Maternal Grandfather    Colon cancer Paternal Grandmother    Cancer Paternal Grandmother    Lung cancer Paternal Grandfather         (smoker)   Cancer Paternal Grandfather    Diabetes Daughter    Breast cancer Maternal Aunt    Thyroid  disease Cousin    Cancer Other        unsure (ovarian/uterine)   Cancer Maternal Aunt    Cancer Maternal Aunt     Review of Systems: Review of Systems  Constitutional:  Negative for chills and fever.  Respiratory:  Negative for shortness of breath.   Cardiovascular:  Negative for chest pain.  Gastrointestinal:  Negative for nausea and vomiting.  Musculoskeletal:        Pain and spasms in the lower bilateral chest walls and back areas where the masses are located    Physical Exam BP 116/69   Pulse 89  Ht 5' 7 (1.702 m)   Wt 197 lb (89.4 kg)   LMP 10/04/2014 Comment: per labs  SpO2 98%   BMI 30.85 kg/m  CONSTITUTIONAL: No  acute distress HEENT:  Normocephalic, atraumatic, extraocular motion intact. RESPIRATORY:  Normal respiratory effort without pathologic use of accessory muscles. CARDIOVASCULAR: Regular rhythm and rate MUSCULOSKELETAL:  Normal muscle strength and tone in all four extremities.  No peripheral edema or cyanosis. SKIN: Patient has multiple masses in the left lower chest wall, right lower chest wall, left mid back, and right mid back.  Overall these are small with the largest measuring about 3.5 cm.  Overall the palpable masses are somewhat mobile, soft, with some tenderness to deep palpation over the masses. NEUROLOGIC:  Motor and sensation is grossly normal.  Cranial nerves are grossly intact. PSYCH:  Alert and oriented to person, place and time. Affect is normal.  Laboratory Analysis: Labs from 10/07/2023: Sodium 142, potassium 4.2, chloride 101, CO2 31, BUN 9, creatinine 0.85, LFTs within normal limits.  WBC 5.2, hemoglobin 14, hematocrit 41.9, platelets 268.  Imaging: Ultrasound abdomen on 06/05/23: FINDINGS: Multiple ill-defined subcutaneous nodules which are slightly hypoechoic to background fat demonstrating internal striations. The 1 on the left measures measures 3.4 x 0.6 x 2.7 cm and 1 on the right measures 2.4 x 1.1 x 0.5 cm.   IMPRESSION: Multiple ill-defined subcutaneous nodules which are slightly hypoechoic to background fat demonstrating internal striations. These are favored to reflect lipomas. If there is clinical concern for atypical features such as pain or growth, further evaluation with MRI with and without contrast could be considered.  Assessment and Plan: This is a 59 y.o. female with multiple lipomas of the lower chest wall and back areas.  - Discussed with patient findings on exam showing different masses in the bilateral lower chest walls as well as bilateral back areas.  Discussed with her that we do not have a yet clear understanding of how lipomas form but in her case  there may be a genetic component given the multitude of lipomas seen.  Discussed with her that this could potentially be causing the pain and muscle spasms depending on how they are growing and how they may be pushing the specific nerves.  Discussed with patient that we could excise the lipomas to try to help her with the symptoms.  However her symptoms might not improve if these are not related to the lipomas.  She would like to remove these masses. - Given the multitude of masses, we will start on the left side removing at least 2-3 lipomas that I can palpate more easily.  Will have her come back for an office procedure.  Reviewed the procedure at length with her including the planned incisions, risks of bleeding, infection, injury to surrounding structures, use of local anesthesia, pain control, and she is willing to proceed. - Follow-up with me on 02/10/2024 for procedure visit.  I spent 30 minutes dedicated to the care of this patient on the date of this encounter to include pre-visit review of records, face-to-face time with the patient discussing diagnosis and management, and any post-visit coordination of care.   Aloysius Sheree Plant, MD [facility]        [1]  Allergies Allergen Reactions   Augmentin  [Amoxicillin -Pot Clavulanate] Swelling    Lip swelling and irritation.    Fluconazole  Swelling    Lips - swelling, bleeding, blisters, cracking, peeling   Erythromycin Nausea And Vomiting   Influenza Vaccines Hives and  Swelling    Redness, heat, and extreme swelling at site   Levothyroxine  Hives   Thimerosal (Thiomersal) Swelling    Local reaction-redness,swelling; veins popping   Tomato Other (See Comments)    Severe acid reflux with all acid foods   Atogepant Other (See Comments)    Gi symptoms   Atorvastatin  Other (See Comments)    GI upset, even once weekly dosing   Nurtec [Rimegepant Sulfate] Other (See Comments)    GI upset    Adhesive [Tape] Rash     Blisters Paper tape and Tegaderm ok   Azithromycin Rash   Gabapentin Other (See Comments)    Short term memory loss   Imitrex [Sumatriptan] Other (See Comments)    Rebound migraine    Lyrica [Pregabalin] Other (See Comments)    Short term memory loss   Macrodantin  [Nitrofurantoin ] Rash   Macrolides And Ketolides Rash   Nickel Swelling and Rash   Sulfa Antibiotics Rash   Sulfasalazine Rash   Troleandomycin Rash   Vancomycin  Itching and Rash    Developed Redman's, may require premedication and infusion rate reduction in the future   "

## 2024-01-27 ENCOUNTER — Ambulatory Visit: Payer: Self-pay | Admitting: Family Medicine

## 2024-02-10 ENCOUNTER — Ambulatory Visit: Admitting: Surgery

## 2024-02-17 ENCOUNTER — Institutional Professional Consult (permissible substitution): Admitting: Neurology

## 2024-04-14 ENCOUNTER — Ambulatory Visit: Admitting: Family Medicine

## 2024-08-12 ENCOUNTER — Ambulatory Visit
